# Patient Record
Sex: Female | Born: 1945 | Race: White | Hispanic: No | Marital: Married | Smoking: Current some day smoker
Health system: Southern US, Community
[De-identification: ages and names within clinical notes are randomized; demographics above are authoritative.]

## PROBLEM LIST (undated history)

## (undated) DIAGNOSIS — I10 Essential (primary) hypertension: Secondary | ICD-10-CM

## (undated) DIAGNOSIS — C50919 Malignant neoplasm of unspecified site of unspecified female breast: Secondary | ICD-10-CM

## (undated) DIAGNOSIS — J9819 Other pulmonary collapse: Secondary | ICD-10-CM

## (undated) DIAGNOSIS — D759 Disease of blood and blood-forming organs, unspecified: Secondary | ICD-10-CM

## (undated) DIAGNOSIS — J449 Chronic obstructive pulmonary disease, unspecified: Secondary | ICD-10-CM

## (undated) DIAGNOSIS — IMO0002 Reserved for concepts with insufficient information to code with codable children: Secondary | ICD-10-CM

## (undated) DIAGNOSIS — R51 Headache: Secondary | ICD-10-CM

## (undated) DIAGNOSIS — T8859XA Other complications of anesthesia, initial encounter: Secondary | ICD-10-CM

## (undated) DIAGNOSIS — E785 Hyperlipidemia, unspecified: Secondary | ICD-10-CM

## (undated) DIAGNOSIS — Z8614 Personal history of Methicillin resistant Staphylococcus aureus infection: Secondary | ICD-10-CM

## (undated) DIAGNOSIS — Z9221 Personal history of antineoplastic chemotherapy: Secondary | ICD-10-CM

## (undated) DIAGNOSIS — C229 Malignant neoplasm of liver, not specified as primary or secondary: Secondary | ICD-10-CM

## (undated) DIAGNOSIS — T4145XA Adverse effect of unspecified anesthetic, initial encounter: Secondary | ICD-10-CM

## (undated) DIAGNOSIS — K219 Gastro-esophageal reflux disease without esophagitis: Secondary | ICD-10-CM

## (undated) DIAGNOSIS — M1990 Unspecified osteoarthritis, unspecified site: Secondary | ICD-10-CM

## (undated) DIAGNOSIS — R519 Headache, unspecified: Secondary | ICD-10-CM

## (undated) DIAGNOSIS — Z923 Personal history of irradiation: Secondary | ICD-10-CM

## (undated) DIAGNOSIS — C3490 Malignant neoplasm of unspecified part of unspecified bronchus or lung: Secondary | ICD-10-CM

## (undated) HISTORY — PX: SHOULDER SURGERY: SHX246

## (undated) HISTORY — PX: NASAL SINUS SURGERY: SHX719

## (undated) HISTORY — DX: Personal history of antineoplastic chemotherapy: Z92.21

## (undated) HISTORY — DX: Unspecified osteoarthritis, unspecified site: M19.90

## (undated) HISTORY — PX: ABDOMINAL HYSTERECTOMY: SHX81

## (undated) HISTORY — DX: Personal history of irradiation: Z92.3

## (undated) HISTORY — PX: OTHER SURGICAL HISTORY: SHX169

## (undated) HISTORY — PX: JOINT REPLACEMENT: SHX530

## (undated) HISTORY — DX: Hyperlipidemia, unspecified: E78.5

## (undated) HISTORY — PX: TOTAL HIP ARTHROPLASTY: SHX124

## (undated) HISTORY — DX: Essential (primary) hypertension: I10

## (undated) HISTORY — DX: Reserved for concepts with insufficient information to code with codable children: IMO0002

## (undated) HISTORY — DX: Malignant neoplasm of unspecified site of unspecified female breast: C50.919

## (undated) HISTORY — PX: SQUAMOUS CELL CARCINOMA EXCISION: SHX2433

---

## 2000-03-12 HISTORY — PX: BREAST BIOPSY: SHX20

## 2002-08-24 ENCOUNTER — Other Ambulatory Visit: Admission: RE | Admit: 2002-08-24 | Discharge: 2002-08-24 | Payer: Self-pay | Admitting: Family Medicine

## 2005-02-06 ENCOUNTER — Ambulatory Visit: Payer: Self-pay | Admitting: Family Medicine

## 2005-02-22 ENCOUNTER — Ambulatory Visit: Payer: Self-pay | Admitting: Family Medicine

## 2005-03-08 ENCOUNTER — Ambulatory Visit: Payer: Self-pay | Admitting: Family Medicine

## 2005-03-12 HISTORY — PX: BREAST EXCISIONAL BIOPSY: SUR124

## 2005-05-17 ENCOUNTER — Ambulatory Visit: Payer: Self-pay | Admitting: Family Medicine

## 2005-05-18 ENCOUNTER — Ambulatory Visit: Payer: Self-pay | Admitting: Family Medicine

## 2005-05-25 ENCOUNTER — Ambulatory Visit: Payer: Self-pay | Admitting: Family Medicine

## 2005-06-01 ENCOUNTER — Ambulatory Visit: Payer: Self-pay | Admitting: General Surgery

## 2005-06-04 ENCOUNTER — Ambulatory Visit: Payer: Self-pay | Admitting: Family Medicine

## 2005-06-12 ENCOUNTER — Other Ambulatory Visit: Payer: Self-pay

## 2005-06-20 ENCOUNTER — Ambulatory Visit: Payer: Self-pay | Admitting: General Surgery

## 2005-07-05 ENCOUNTER — Ambulatory Visit: Payer: Self-pay | Admitting: Internal Medicine

## 2005-07-10 ENCOUNTER — Ambulatory Visit: Payer: Self-pay | Admitting: Internal Medicine

## 2005-07-20 ENCOUNTER — Ambulatory Visit: Payer: Self-pay | Admitting: General Surgery

## 2005-08-10 ENCOUNTER — Ambulatory Visit: Payer: Self-pay | Admitting: Internal Medicine

## 2005-09-09 ENCOUNTER — Ambulatory Visit: Payer: Self-pay | Admitting: Internal Medicine

## 2005-10-10 ENCOUNTER — Ambulatory Visit: Payer: Self-pay | Admitting: Internal Medicine

## 2005-11-10 ENCOUNTER — Ambulatory Visit: Payer: Self-pay | Admitting: Internal Medicine

## 2005-12-10 ENCOUNTER — Ambulatory Visit: Payer: Self-pay | Admitting: Internal Medicine

## 2006-01-02 ENCOUNTER — Ambulatory Visit: Payer: Self-pay | Admitting: Family Medicine

## 2006-01-08 ENCOUNTER — Other Ambulatory Visit: Admission: RE | Admit: 2006-01-08 | Discharge: 2006-01-08 | Payer: Self-pay | Admitting: Family Medicine

## 2006-01-08 ENCOUNTER — Encounter: Payer: Self-pay | Admitting: Family Medicine

## 2006-01-08 ENCOUNTER — Ambulatory Visit: Payer: Self-pay | Admitting: Family Medicine

## 2006-01-08 LAB — CONVERTED CEMR LAB: Pap Smear: NORMAL

## 2006-01-10 ENCOUNTER — Ambulatory Visit: Payer: Self-pay | Admitting: Internal Medicine

## 2006-02-04 ENCOUNTER — Ambulatory Visit: Payer: Self-pay | Admitting: Family Medicine

## 2006-02-09 ENCOUNTER — Ambulatory Visit: Payer: Self-pay | Admitting: Internal Medicine

## 2006-03-11 ENCOUNTER — Ambulatory Visit: Payer: Self-pay | Admitting: General Surgery

## 2006-03-12 ENCOUNTER — Ambulatory Visit: Payer: Self-pay | Admitting: Internal Medicine

## 2006-03-19 ENCOUNTER — Ambulatory Visit: Payer: Self-pay | Admitting: Family Medicine

## 2006-03-20 ENCOUNTER — Ambulatory Visit: Payer: Self-pay | Admitting: Family Medicine

## 2006-04-08 ENCOUNTER — Ambulatory Visit: Payer: Self-pay | Admitting: Family Medicine

## 2006-04-08 LAB — CONVERTED CEMR LAB
AST: 17 units/L (ref 0–37)
Cholesterol: 157 mg/dL (ref 0–200)
Total CHOL/HDL Ratio: 2.8

## 2006-04-10 ENCOUNTER — Ambulatory Visit: Payer: Self-pay | Admitting: Family Medicine

## 2006-04-19 ENCOUNTER — Ambulatory Visit: Payer: Self-pay | Admitting: Internal Medicine

## 2006-05-11 ENCOUNTER — Ambulatory Visit: Payer: Self-pay | Admitting: Internal Medicine

## 2006-06-11 ENCOUNTER — Ambulatory Visit: Payer: Self-pay | Admitting: Internal Medicine

## 2006-06-20 ENCOUNTER — Ambulatory Visit: Payer: Self-pay | Admitting: Family Medicine

## 2006-07-11 ENCOUNTER — Ambulatory Visit: Payer: Self-pay | Admitting: Radiation Oncology

## 2006-07-11 ENCOUNTER — Ambulatory Visit: Payer: Self-pay | Admitting: Internal Medicine

## 2006-08-11 ENCOUNTER — Ambulatory Visit: Payer: Self-pay | Admitting: Radiation Oncology

## 2006-08-11 ENCOUNTER — Ambulatory Visit: Payer: Self-pay | Admitting: Internal Medicine

## 2006-08-30 ENCOUNTER — Ambulatory Visit: Payer: Self-pay | Admitting: General Surgery

## 2006-09-03 ENCOUNTER — Telehealth (INDEPENDENT_AMBULATORY_CARE_PROVIDER_SITE_OTHER): Payer: Self-pay | Admitting: *Deleted

## 2006-09-06 ENCOUNTER — Ambulatory Visit: Payer: Self-pay | Admitting: General Surgery

## 2006-09-10 ENCOUNTER — Ambulatory Visit: Payer: Self-pay | Admitting: Radiation Oncology

## 2006-09-10 ENCOUNTER — Ambulatory Visit: Payer: Self-pay | Admitting: Internal Medicine

## 2006-10-11 ENCOUNTER — Ambulatory Visit: Payer: Self-pay | Admitting: Internal Medicine

## 2006-11-11 ENCOUNTER — Ambulatory Visit: Payer: Self-pay | Admitting: Internal Medicine

## 2006-12-11 ENCOUNTER — Ambulatory Visit: Payer: Self-pay | Admitting: Internal Medicine

## 2006-12-17 ENCOUNTER — Encounter: Payer: Self-pay | Admitting: Family Medicine

## 2006-12-23 ENCOUNTER — Encounter: Payer: Self-pay | Admitting: Family Medicine

## 2006-12-23 DIAGNOSIS — I10 Essential (primary) hypertension: Secondary | ICD-10-CM

## 2006-12-23 DIAGNOSIS — K219 Gastro-esophageal reflux disease without esophagitis: Secondary | ICD-10-CM | POA: Insufficient documentation

## 2007-01-06 ENCOUNTER — Encounter: Payer: Self-pay | Admitting: Family Medicine

## 2007-01-11 ENCOUNTER — Ambulatory Visit: Payer: Self-pay | Admitting: Internal Medicine

## 2007-01-13 ENCOUNTER — Ambulatory Visit: Payer: Self-pay | Admitting: Family Medicine

## 2007-01-13 LAB — CONVERTED CEMR LAB
AST: 21 units/L (ref 0–37)
BUN: 5 mg/dL — ABNORMAL LOW (ref 6–23)
Calcium: 9.1 mg/dL (ref 8.4–10.5)
Eosinophils Absolute: 0.1 10*3/uL (ref 0.0–0.6)
GFR calc Af Amer: 110 mL/min
GFR calc non Af Amer: 91 mL/min
HDL: 62.2 mg/dL (ref >39.0)
Lymphocytes Relative: 28.2 % (ref 12.0–46.0)
MCV: 94.6 fL (ref 78.0–100.0)
Monocytes Relative: 7.8 % (ref 3.0–11.0)
Neutro Abs: 2.7 10*3/uL (ref 1.4–7.7)
Platelets: 240 10*3/uL (ref 150–400)
TSH: 0.55 microintl units/mL (ref 0.35–5.50)
Triglycerides: 104 mg/dL (ref 0–149)
VLDL: 21 mg/dL (ref 0–40)

## 2007-01-14 ENCOUNTER — Encounter: Payer: Self-pay | Admitting: Family Medicine

## 2007-01-14 ENCOUNTER — Ambulatory Visit: Payer: Self-pay | Admitting: Family Medicine

## 2007-01-14 ENCOUNTER — Other Ambulatory Visit: Admission: RE | Admit: 2007-01-14 | Discharge: 2007-01-14 | Payer: Self-pay | Admitting: Family Medicine

## 2007-01-20 ENCOUNTER — Encounter (INDEPENDENT_AMBULATORY_CARE_PROVIDER_SITE_OTHER): Payer: Self-pay | Admitting: *Deleted

## 2007-01-28 ENCOUNTER — Encounter (INDEPENDENT_AMBULATORY_CARE_PROVIDER_SITE_OTHER): Payer: Self-pay | Admitting: *Deleted

## 2007-01-28 ENCOUNTER — Ambulatory Visit: Payer: Self-pay | Admitting: Family Medicine

## 2007-02-10 ENCOUNTER — Ambulatory Visit: Payer: Self-pay | Admitting: Internal Medicine

## 2007-02-14 ENCOUNTER — Ambulatory Visit: Payer: Self-pay | Admitting: General Surgery

## 2007-03-04 ENCOUNTER — Encounter: Payer: Self-pay | Admitting: Family Medicine

## 2007-04-13 ENCOUNTER — Ambulatory Visit: Payer: Self-pay | Admitting: Internal Medicine

## 2007-04-16 ENCOUNTER — Ambulatory Visit: Payer: Self-pay | Admitting: Internal Medicine

## 2007-04-23 ENCOUNTER — Ambulatory Visit: Payer: Self-pay | Admitting: Family Medicine

## 2007-05-11 ENCOUNTER — Ambulatory Visit: Payer: Self-pay | Admitting: Internal Medicine

## 2007-06-10 ENCOUNTER — Ambulatory Visit: Payer: Self-pay | Admitting: Family Medicine

## 2007-06-14 ENCOUNTER — Ambulatory Visit: Payer: Self-pay | Admitting: Internal Medicine

## 2007-06-26 ENCOUNTER — Telehealth: Payer: Self-pay | Admitting: Family Medicine

## 2007-08-11 ENCOUNTER — Ambulatory Visit: Payer: Self-pay | Admitting: Internal Medicine

## 2007-08-15 ENCOUNTER — Ambulatory Visit: Payer: Self-pay | Admitting: Family Medicine

## 2007-08-18 ENCOUNTER — Ambulatory Visit: Payer: Self-pay | Admitting: General Surgery

## 2007-09-02 ENCOUNTER — Encounter: Payer: Self-pay | Admitting: Family Medicine

## 2007-10-11 ENCOUNTER — Ambulatory Visit: Payer: Self-pay | Admitting: Internal Medicine

## 2007-10-15 ENCOUNTER — Ambulatory Visit: Payer: Self-pay | Admitting: Internal Medicine

## 2007-10-23 ENCOUNTER — Telehealth: Payer: Self-pay | Admitting: Family Medicine

## 2007-10-23 ENCOUNTER — Encounter: Payer: Self-pay | Admitting: Family Medicine

## 2007-11-04 ENCOUNTER — Ambulatory Visit: Payer: Self-pay | Admitting: Family Medicine

## 2007-11-04 ENCOUNTER — Encounter: Payer: Self-pay | Admitting: Family Medicine

## 2007-11-11 ENCOUNTER — Ambulatory Visit: Payer: Self-pay | Admitting: Internal Medicine

## 2007-11-13 ENCOUNTER — Ambulatory Visit: Payer: Self-pay | Admitting: Family Medicine

## 2007-11-14 ENCOUNTER — Encounter: Payer: Self-pay | Admitting: Family Medicine

## 2007-12-10 ENCOUNTER — Ambulatory Visit: Payer: Self-pay | Admitting: Family Medicine

## 2007-12-10 DIAGNOSIS — F17200 Nicotine dependence, unspecified, uncomplicated: Secondary | ICD-10-CM

## 2008-01-07 ENCOUNTER — Ambulatory Visit: Payer: Self-pay | Admitting: Internal Medicine

## 2008-01-11 ENCOUNTER — Ambulatory Visit: Payer: Self-pay | Admitting: Internal Medicine

## 2008-01-14 ENCOUNTER — Ambulatory Visit: Payer: Self-pay | Admitting: Radiation Oncology

## 2008-01-16 ENCOUNTER — Ambulatory Visit: Payer: Self-pay | Admitting: General Surgery

## 2008-01-19 ENCOUNTER — Ambulatory Visit: Payer: Self-pay | Admitting: Family Medicine

## 2008-01-20 LAB — CONVERTED CEMR LAB
ALT: 27 units/L (ref 0–35)
AST: 23 units/L (ref 0–37)
Albumin: 3.8 g/dL (ref 3.5–5.2)
Alkaline Phosphatase: 66 units/L (ref 39–117)
BUN: 13 mg/dL (ref 6–23)
CO2: 29 meq/L (ref 19–32)
Eosinophils Relative: 3.8 % (ref 0.0–5.0)
GFR calc Af Amer: 94 mL/min
Glucose, Bld: 97 mg/dL (ref 70–99)
HCT: 37.5 % (ref 36.0–46.0)
HDL: 50.3 mg/dL (ref >39.0)
Hemoglobin: 13.2 g/dL (ref 12.0–15.0)
Lymphocytes Relative: 23.1 % (ref 12.0–46.0)
Monocytes Absolute: 0.3 10*3/uL (ref 0.1–1.0)
Monocytes Relative: 8.4 % (ref 3.0–12.0)
Platelets: 225 10*3/uL (ref 150–400)
Potassium: 5 meq/L (ref 3.5–5.1)
Total CHOL/HDL Ratio: 3
Total Protein: 6.3 g/dL (ref 6.0–8.3)
WBC: 4 10*3/uL — ABNORMAL LOW (ref 4.5–10.5)

## 2008-01-22 ENCOUNTER — Ambulatory Visit: Payer: Self-pay | Admitting: Family Medicine

## 2008-01-29 ENCOUNTER — Encounter: Payer: Self-pay | Admitting: Family Medicine

## 2008-02-02 ENCOUNTER — Ambulatory Visit: Payer: Self-pay | Admitting: Family Medicine

## 2008-02-10 ENCOUNTER — Ambulatory Visit: Payer: Self-pay | Admitting: Radiation Oncology

## 2008-02-13 ENCOUNTER — Ambulatory Visit: Payer: Self-pay | Admitting: General Surgery

## 2008-02-13 ENCOUNTER — Ambulatory Visit: Payer: Self-pay | Admitting: Family Medicine

## 2008-02-13 LAB — CONVERTED CEMR LAB: OCCULT 1: NEGATIVE

## 2008-02-16 ENCOUNTER — Encounter (INDEPENDENT_AMBULATORY_CARE_PROVIDER_SITE_OTHER): Payer: Self-pay | Admitting: *Deleted

## 2008-05-04 ENCOUNTER — Ambulatory Visit: Payer: Self-pay | Admitting: Family Medicine

## 2008-05-10 ENCOUNTER — Ambulatory Visit: Payer: Self-pay | Admitting: Otolaryngology

## 2008-05-20 ENCOUNTER — Ambulatory Visit: Payer: Self-pay | Admitting: Otolaryngology

## 2008-06-10 ENCOUNTER — Ambulatory Visit: Payer: Self-pay | Admitting: Internal Medicine

## 2008-06-16 ENCOUNTER — Ambulatory Visit: Payer: Self-pay | Admitting: Internal Medicine

## 2008-07-10 ENCOUNTER — Ambulatory Visit: Payer: Self-pay | Admitting: Internal Medicine

## 2008-08-30 ENCOUNTER — Ambulatory Visit: Payer: Self-pay | Admitting: General Surgery

## 2008-09-09 ENCOUNTER — Encounter: Payer: Self-pay | Admitting: Family Medicine

## 2008-12-08 ENCOUNTER — Ambulatory Visit: Payer: Self-pay | Admitting: Internal Medicine

## 2008-12-10 ENCOUNTER — Ambulatory Visit: Payer: Self-pay | Admitting: Internal Medicine

## 2009-01-10 ENCOUNTER — Ambulatory Visit: Payer: Self-pay | Admitting: Radiation Oncology

## 2009-01-13 ENCOUNTER — Ambulatory Visit: Payer: Self-pay | Admitting: Radiation Oncology

## 2009-01-20 ENCOUNTER — Ambulatory Visit: Payer: Self-pay | Admitting: Family Medicine

## 2009-01-20 LAB — CONVERTED CEMR LAB
BUN: 7 mg/dL (ref 6–23)
Basophils Relative: 1.3 % (ref 0.0–3.0)
Bilirubin, Direct: 0 mg/dL (ref 0.0–0.3)
CO2: 28 meq/L (ref 19–32)
Chloride: 103 meq/L (ref 96–112)
Eosinophils Relative: 2.3 % (ref 0.0–5.0)
Glucose, Bld: 99 mg/dL (ref 70–99)
HCT: 40 % (ref 36.0–46.0)
HDL: 59.2 mg/dL (ref >39.00)
LDL Cholesterol: 89 mg/dL (ref 0–99)
Lymphs Abs: 1.4 10*3/uL (ref 0.7–4.0)
MCV: 94.1 fL (ref 78.0–100.0)
Monocytes Absolute: 0.4 10*3/uL (ref 0.1–1.0)
Monocytes Relative: 8.5 % (ref 3.0–12.0)
Neutrophils Relative %: 55 % (ref 43.0–77.0)
Potassium: 4.4 meq/L (ref 3.5–5.1)
RBC: 4.25 M/uL (ref 3.87–5.11)
Sodium: 139 meq/L (ref 135–145)
TSH: 0.59 microintl units/mL (ref 0.35–5.50)
Total Bilirubin: 0.9 mg/dL (ref 0.3–1.2)
Total CHOL/HDL Ratio: 3
VLDL: 17.4 mg/dL (ref 0.0–40.0)
WBC: 4.4 10*3/uL — ABNORMAL LOW (ref 4.5–10.5)

## 2009-01-23 LAB — CONVERTED CEMR LAB: Vit D, 25-Hydroxy: 28 ng/mL — ABNORMAL LOW (ref 30–89)

## 2009-01-25 ENCOUNTER — Other Ambulatory Visit: Admission: RE | Admit: 2009-01-25 | Discharge: 2009-01-25 | Payer: Self-pay | Admitting: Family Medicine

## 2009-01-25 ENCOUNTER — Encounter: Payer: Self-pay | Admitting: Family Medicine

## 2009-01-25 ENCOUNTER — Ambulatory Visit: Payer: Self-pay | Admitting: Family Medicine

## 2009-01-25 LAB — CONVERTED CEMR LAB: Pap Smear: NORMAL

## 2009-01-27 ENCOUNTER — Encounter (INDEPENDENT_AMBULATORY_CARE_PROVIDER_SITE_OTHER): Payer: Self-pay | Admitting: *Deleted

## 2009-02-09 ENCOUNTER — Ambulatory Visit: Payer: Self-pay | Admitting: Radiation Oncology

## 2009-02-11 ENCOUNTER — Ambulatory Visit: Payer: Self-pay | Admitting: Family Medicine

## 2009-02-11 LAB — CONVERTED CEMR LAB
OCCULT 1: NEGATIVE
OCCULT 3: NEGATIVE

## 2009-02-14 ENCOUNTER — Encounter (INDEPENDENT_AMBULATORY_CARE_PROVIDER_SITE_OTHER): Payer: Self-pay | Admitting: *Deleted

## 2009-02-16 ENCOUNTER — Ambulatory Visit: Payer: Self-pay | Admitting: Internal Medicine

## 2009-03-12 ENCOUNTER — Ambulatory Visit: Payer: Self-pay | Admitting: Radiation Oncology

## 2009-03-12 ENCOUNTER — Ambulatory Visit: Payer: Self-pay | Admitting: Internal Medicine

## 2009-03-12 DIAGNOSIS — Z8614 Personal history of Methicillin resistant Staphylococcus aureus infection: Secondary | ICD-10-CM

## 2009-03-12 HISTORY — DX: Personal history of Methicillin resistant Staphylococcus aureus infection: Z86.14

## 2009-06-10 ENCOUNTER — Ambulatory Visit: Payer: Self-pay | Admitting: Internal Medicine

## 2009-06-22 ENCOUNTER — Ambulatory Visit: Payer: Self-pay | Admitting: Internal Medicine

## 2009-07-10 ENCOUNTER — Ambulatory Visit: Payer: Self-pay | Admitting: Internal Medicine

## 2009-09-22 ENCOUNTER — Ambulatory Visit: Payer: Self-pay | Admitting: General Surgery

## 2009-10-12 ENCOUNTER — Ambulatory Visit: Payer: Self-pay

## 2009-10-17 ENCOUNTER — Encounter: Payer: Self-pay | Admitting: Family Medicine

## 2009-10-18 ENCOUNTER — Encounter (INDEPENDENT_AMBULATORY_CARE_PROVIDER_SITE_OTHER): Payer: Self-pay | Admitting: *Deleted

## 2010-01-17 ENCOUNTER — Ambulatory Visit: Payer: Self-pay | Admitting: Internal Medicine

## 2010-01-18 LAB — CANCER ANTIGEN 27.29: CA 27.29: 23.2 U/mL (ref 0.0–38.6)

## 2010-02-09 ENCOUNTER — Ambulatory Visit: Payer: Self-pay | Admitting: Internal Medicine

## 2010-04-11 NOTE — Letter (Signed)
Summary: Shelly Arias letter  [place] [facility]  [address] [place], [place] [address]   Phone: [phone]  Fax: [phone]       10/18/2009 MRN: [id number]  [facility] [address] [place], [place]  [address]  Dear Ms. [facility] [place], and [place] announce the retirement of Arta Silence, M.D., from full-time practice at the [facility] office effective September 08, 2009 and his plans of returning part-time.  It is important to Dr. Hetty Ely and to our practice that you understand that [facility] has seven physicians in our office for your health care needs.  We will continue to offer the same exceptional care that you have today.    Dr. Hetty Ely has spoken to many of you about his plans for retirement and returning part-time in the fall.   We will continue to work with you through the transition to schedule appointments for you in the office and meet the high standards that [facility] is committed to.   Again, it is with great pleasure that we share the news that Dr. Hetty Ely will return to [facility] in October of 2011 with a reduced schedule.    If you have any questions, or would like to request an appointment with one of our physicians, please call us at [phone] and press the option for Scheduling an appointment.  We take pleasure in providing you with excellent patient care and look forward to seeing you at your next office visit.  Our [facility] Physicians are:  Tillman Abide, M.D. Laurita Quint, M.D. Roxy Manns, M.D. Kerby Nora, M.D. Hannah Beat, M.D. Ruthe Mannan, M.D. We proudly welcomed Raechel Ache, M.D. and Eustaquio Boyden, M.D. to the practice in July/August 2011.  Sincerely,  [facility]

## 2010-04-11 NOTE — Letter (Signed)
Summary: [facility]  [facility]   Imported By: Maryln Gottron 10/25/2009 11:25:53  _____________________________________________________________________  External Attachment:    Type:   Image     Comment:   External Document  Appended Document: [facility]    Clinical Lists Changes  Observations: Added new observation of PAST MED HX: R Breast cancer, hx of, per Dr. Lemar Livings.  s/p chemo/rady tx GERD Hyperlipidemia Hypertension Osteopenia  (10/26/2009 13:15)       Past History:  Past Medical History: R Breast cancer, hx of, per Dr. Lemar Livings.  s/p chemo/rady tx GERD Hyperlipidemia Hypertension Osteopenia

## 2010-06-19 ENCOUNTER — Other Ambulatory Visit: Payer: Self-pay | Admitting: Family Medicine

## 2010-06-28 ENCOUNTER — Ambulatory Visit: Payer: Self-pay | Admitting: Internal Medicine

## 2010-06-29 LAB — CANCER ANTIGEN 27.29: CA 27.29: 26.1 U/mL (ref 0.0–38.6)

## 2010-07-11 ENCOUNTER — Ambulatory Visit: Payer: Self-pay | Admitting: Internal Medicine

## 2010-07-31 ENCOUNTER — Other Ambulatory Visit: Payer: Self-pay | Admitting: *Deleted

## 2010-07-31 MED ORDER — PRAVASTATIN SODIUM 40 MG PO TABS: ORAL_TABLET | ORAL | Status: DC

## 2010-09-25 ENCOUNTER — Ambulatory Visit: Payer: Self-pay | Admitting: Internal Medicine

## 2010-10-24 ENCOUNTER — Ambulatory Visit: Payer: Self-pay | Admitting: General Surgery

## 2010-11-02 ENCOUNTER — Other Ambulatory Visit (HOSPITAL_COMMUNITY)
Admission: RE | Admit: 2010-11-02 | Discharge: 2010-11-02 | Disposition: A | Discharge status: Home / Self Care | Payer: No Typology Code available for payment source | Source: Ambulatory Visit | Attending: Internal Medicine | Admitting: Internal Medicine

## 2010-11-02 ENCOUNTER — Encounter: Payer: Self-pay | Admitting: Internal Medicine

## 2010-11-02 ENCOUNTER — Ambulatory Visit (INDEPENDENT_AMBULATORY_CARE_PROVIDER_SITE_OTHER): Payer: No Typology Code available for payment source | Admitting: Internal Medicine

## 2010-11-02 VITALS — BP 136/85 | HR 85 | Temp 98.2°F | Resp 16 | Ht 61.0 in | Wt 167.0 lb

## 2010-11-02 DIAGNOSIS — F17200 Nicotine dependence, unspecified, uncomplicated: Secondary | ICD-10-CM

## 2010-11-02 DIAGNOSIS — Z01419 Encounter for gynecological examination (general) (routine) without abnormal findings: Secondary | ICD-10-CM | POA: Insufficient documentation

## 2010-11-02 DIAGNOSIS — E785 Hyperlipidemia, unspecified: Secondary | ICD-10-CM | POA: Insufficient documentation

## 2010-11-02 DIAGNOSIS — Z0000 Encounter for general adult medical examination without abnormal findings: Secondary | ICD-10-CM

## 2010-11-02 MED ORDER — PRAVASTATIN SODIUM 40 MG PO TABS: 40.0000 mg | ORAL_TABLET | Freq: Every day | ORAL | Status: DC

## 2010-11-02 NOTE — Progress Notes (Signed)
Subjective:    Patient ID: Dawayne Cirri, female    DOB: 09-Feb-1946, 65 y.o.   MRN: [id number]  HPI  Ms. Starlin is a 65 year old female who presents for her general physical exam. She denies any complaints today. She reports she has been feeling well. She has been active and has spent the summer spending time with her grandchildren. She reports a normal appetite. She continues to smoke. She has cut down on the number of cigarettes she smokes, however she continues to smoke one to 5 cigarettes per day.  Outpatient Encounter Prescriptions as of 11/02/2010  Medication Sig Dispense Refill  . metoprolol succinate (TOPROL-XL) 25 MG 24 hr tablet Take 25 mg by mouth daily.        . zoledronic acid (RECLAST) 5 MG/100ML SOLN Inject 5 mg into the vein once. Yearly for osteoporosis       . pravastatin (PRAVACHOL) 40 MG tablet Take 1 tablet (40 mg total) by mouth daily.  90 tablet  3     Review of Systems  Constitutional: Negative for fever, chills, diaphoresis, activity change, appetite change, fatigue and unexpected weight change.  HENT: Negative for hearing loss, ear pain, nosebleeds, congestion, sore throat, facial swelling, rhinorrhea, sneezing, drooling, mouth sores, trouble swallowing, neck pain, neck stiffness, dental problem, voice change, postnasal drip, sinus pressure, tinnitus and ear discharge.   Eyes: Negative for photophobia, pain, discharge, redness, itching and visual disturbance.  Respiratory: Negative for apnea, cough, choking, chest tightness, shortness of breath, wheezing and stridor.   Cardiovascular: Negative for chest pain, palpitations and leg swelling.  Gastrointestinal: Negative for nausea, vomiting, abdominal pain, diarrhea, constipation, blood in stool, abdominal distention, anal bleeding and rectal pain.  Genitourinary: Negative for dysuria, urgency, frequency, hematuria, flank pain, decreased urine volume, vaginal bleeding, vaginal discharge, enuresis, difficulty urinating,  vaginal pain, menstrual problem, pelvic pain and dyspareunia.  Musculoskeletal: Negative for myalgias, back pain, joint swelling, arthralgias and gait problem.  Skin: Negative for color change, rash and wound.  Neurological: Negative for dizziness, tremors, seizures, syncope, facial asymmetry, speech difficulty, weakness, light-headedness, numbness and headaches.  Hematological: Negative for adenopathy. Does not bruise/bleed easily.  Psychiatric/Behavioral: Negative for suicidal ideas, hallucinations, sleep disturbance, dysphoric mood and decreased concentration. The patient is not nervous/anxious.    BP 136/85  Pulse 85  Temp(Src) 98.2 F (36.8 C) (Oral)  Resp 16  Ht 5\' 1"  (1.549 m)  Wt 167 lb (75.751 kg)  BMI 31.55 kg/m2  SpO2 98%     Objective:   Physical Exam  Constitutional: She is oriented to person, place, and time. Vital signs are normal. She appears well-developed and well-nourished. No distress.  HENT:  Head: Normocephalic and atraumatic.  Right Ear: External ear and ear canal normal. No decreased hearing is noted.  Left Ear: External ear and ear canal normal. No decreased hearing is noted.  Nose: Nose normal.  Mouth/Throat: Oropharynx is clear and moist. No oropharyngeal exudate.  Eyes: Conjunctivae, EOM and lids are normal. Pupils are equal, round, and reactive to light. Right eye exhibits no discharge. Left eye exhibits no discharge. No scleral icterus.  Neck: Normal range of motion. Neck supple. Carotid bruit is not present. No tracheal deviation, no edema and no erythema present. No mass and no thyromegaly present.  Cardiovascular: Normal rate, regular rhythm, normal heart sounds and intact distal pulses.  Exam reveals no gallop and no friction rub.   No murmur heard. Pulmonary/Chest: Effort normal and breath sounds normal. No respiratory distress. She has no wheezes.  She has no rales. She exhibits no tenderness.  Abdominal: Soft. Normal appearance and bowel sounds are  normal. There is no hepatosplenomegaly. There is no tenderness.  Genitourinary: Vagina normal. No breast swelling, tenderness, discharge or bleeding. Pelvic exam was performed with patient prone. There is no rash, tenderness, lesion or injury on the right labia. There is no rash, tenderness, lesion or injury on the left labia.       Vaginal walls normal except for mild atrophy  Musculoskeletal: Normal range of motion. She exhibits no edema and no tenderness.  Lymphadenopathy:    She has no cervical adenopathy.    She has no axillary adenopathy.  Neurological: She is alert and oriented to person, place, and time. She displays no tremor. No cranial nerve deficit or sensory deficit. She exhibits normal muscle tone. Coordination and gait normal.  Skin: Skin is warm and dry. No rash noted. She is not diaphoretic. No cyanosis or erythema. No pallor. Nails show no clubbing.  Psychiatric: She has a normal mood and affect. Her speech is normal and behavior is normal. Judgment and thought content normal. Cognition and memory are normal.          Assessment & Plan:  1. General Physical Exam - Ms.  Bowring presents for her general physical exam today. She has been doing very well and has no complaints today. Exam is normal except for mild atrophic vaginitis. She has been on statin therapy for hyperlipidemia and we will recheck lipids and liver function tests with labs. She is up-to-date on her mammogram. We will request records from Dr. Silverio Lay office. We will also request records on her recent DEXA scan.  We will call her or send letter with results of labs.  Strongly encouraged smoking cessation today and gave handout on smoking cessation. She understands risks of smoking especially in light of her strong family history of heart disease and personal history of breast cancer. She will follow up in 6 months.

## 2010-11-02 NOTE — Patient Instructions (Signed)
Smoking Cessation This document explains the best ways for you to quit smoking and new treatments to help. It lists new medicines that can double or triple your chances of quitting and quitting for good. It also considers ways to avoid relapses and concerns you may have about quitting, including weight gain. NICOTINE: A POWERFUL ADDICTION If you have tried to quit smoking, you know how hard it can be. It is hard because nicotine is a very addictive drug. For some people, it can be as addictive as heroin or cocaine. Usually, people make 2 or 3 tries, or more, before finally being able to quit. Each time you try to quit, you can learn about what helps and what hurts. Quitting takes hard work and a lot of effort, but you can quit smoking. QUITTING SMOKING IS ONE OF THE MOST IMPORTANT THINGS YOU WILL EVER DO:  You will live longer, feel better, and live better.   The impact on your body of quitting smoking is felt almost immediately:   Within 20 minutes, blood pressure decreases. Pulse returns to its normal level.   After 8 hours, carbon monoxide levels in the blood return to normal. Oxygen level increases.   After 24 hours, chance of heart attack starts to decrease. Breath, hair, and body stop smelling like smoke.   After 48 hours, damaged nerve endings begin to recover. Sense of taste and smell improve.   After 72 hours, the body is virtually free of nicotine. Bronchial tubes relax and breathing becomes easier.   After 2 to 12 weeks, lungs can hold more air. Exercise becomes easier and circulation improves.   Quitting will lower your chance of having a heart attack, stroke, cancer, or lung disease:   After 1 year, the risk of coronary heart disease is cut in half.   After 5 years, the risk of stroke falls to the same as a nonsmoker.   After 10 years, the risk of lung cancer is cut in half and the risk of other cancers decreases significantly.   After 15 years, the risk of coronary heart  disease drops, usually to the level of a nonsmoker.   If you are pregnant, quitting smoking will improve your chances of having a healthy baby.   The people you live with, especially your children, will be healthier.   You will have extra money to spend on things other than cigarettes.  FIVE KEYS TO QUITTING Studies have shown that these 5 steps will help you quit smoking and quit for good. You have the best chances of quitting if you use them together: 1. Get ready.  2. Get support and encouragement.  3. Learn new skills and behaviors.  4. Get medicine to reduce your nicotine addiction and use it correctly.  5. Be prepared for relapse or difficult situations. Be determined to continue trying to quit, even if you do not succeed at first.  1. GET READY  Set a quit date.   Change your environment.   Get rid of ALL cigarettes, ashtrays, matches, and lighters in your home, car, and place of work.   Do not let people smoke in your home.   Review your past attempts to quit. Think about what worked and what did not.   Once you quit, do not smoke. NOT EVEN A PUFF!  2. GET SUPPORT AND ENCOURAGEMENT Studies have shown that you have a better chance of being successful if you have help. You can get support in many ways.  Tell   your family, friends, and coworkers that you are going to quit and need their support. Ask them not to smoke around you.   Talk to your caregivers (doctor, dentist, nurse, pharmacist, psychologist, and/or smoking counselor).   Get individual, group, or telephone counseling and support. The more counseling you have, the better your chances are of quitting. Programs are available at local hospitals and health centers. Call your local health department for information about programs in your area.   Spiritual beliefs and practices may help some smokers quit.   Quit meters are small computer programs online or downloadable that keep track of quit statistics, such as amount  of "quit-time," cigarettes not smoked, and money saved.   Many smokers find one or more of the many self-help books available useful in helping them quit and stay off tobacco.  3. LEARN NEW SKILLS AND BEHAVIORS  Try to distract yourself from urges to smoke. Talk to someone, go for a walk, or occupy your time with a task.   When you first try to quit, change your routine. Take a different route to work. Drink tea instead of coffee. Eat breakfast in a different place.   Do something to reduce your stress. Take a hot bath, exercise, or read a book.   Plan something enjoyable to do every day. Reward yourself for not smoking.   Explore interactive web-based programs that specialize in helping you quit.  4. GET MEDICINE AND USE IT CORRECTLY Medicines can help you stop smoking and decrease the urge to smoke. Combining medicine with the above behavioral methods and support can quadruple your chances of successfully quitting smoking. The U.S. Food and Drug Administration (FDA) has approved 7 medicines to help you quit smoking. These medicines fall into 3 categories.  Nicotine replacement therapy (delivers nicotine to your body without the negative effects and risks of smoking):   Nicotine gum: Available over-the-counter.   Nicotine lozenges: Available over-the-counter.   Nicotine inhaler: Available by prescription.   Nicotine nasal spray: Available by prescription.   Nicotine skin patches (transdermal): Available by prescription and over-the-counter.   Antidepressant medicine (helps people abstain from smoking, but how this works is unknown):   Bupropion sustained-release (SR) tablets: Available by prescription.   Nicotinic receptor partial agonist (simulates the effect of nicotine in your brain):   Varenicline tartrate tablets: Available by prescription.   Ask your caregiver for advice about which medicines to use and how to use them. Carefully read the information on the package.    Everyone who is trying to quit may benefit from using a medicine. If you are pregnant or trying to become pregnant, nursing an infant, you are under age 18, or you smoke fewer than 10 cigarettes per day, talk to your caregiver before taking any nicotine replacement medicines.   You should stop using a nicotine replacement product and call your caregiver if you experience nausea, dizziness, weakness, vomiting, fast or irregular heartbeat, mouth problems with the lozenge or gum, or redness or swelling of the skin around the patch that does not go away.   Do not use any other product containing nicotine while using a nicotine replacement product.   Talk to your caregiver before using these products if you have diabetes, heart disease, asthma, stomach ulcers, you had a recent heart attack, you have high blood pressure that is not controlled with medicine, a history of irregular heartbeat, or you have been prescribed medicine to help you quit smoking.  5. BE PREPARED FOR RELAPSE OR   DIFFICULT SITUATIONS  Most relapses occur within the first 3 months after quitting. Do not be discouraged if you start smoking again. Remember, most people try several times before they finally quit.   You may have symptoms of withdrawal because your body is used to nicotine. You may crave cigarettes, be irritable, feel very hungry, cough often, get headaches, or have difficulty concentrating.   The withdrawal symptoms are only temporary. They are strongest when you first quit, but they will go away within 10 to 14 days.  Here are some difficult situations to watch for:  Alcohol. Avoid drinking alcohol. Drinking lowers your chances of successfully quitting.   Caffeine. Try to reduce the amount of caffeine you consume. It also lowers your chances of successfully quitting.   Other smokers. Being around smoking can make you want to smoke. Avoid smokers.   Weight gain. Many smokers will gain weight when they quit, usually  less than 10 pounds. Eat a healthy diet and stay active. Do not let weight gain distract you from your main goal, quitting smoking. Some medicines that help you quit smoking may also help delay weight gain. You can always lose the weight gained after you quit.   Bad mood or depression. There are a lot of ways to improve your mood other than smoking.  If you are having problems with any of these situations, talk to your caregiver. SPECIAL SITUATIONS OR CONDITIONS Studies suggest that everyone can quit smoking. Your situation or condition can give you a special reason to quit.  Pregnant women/New mothers: By quitting, you protect your baby's health and your own.   Hospitalized patients: By quitting, you reduce health problems and help healing.   Heart attack patients: By quitting, you reduce your risk of a second heart attack.   Lung, head, and neck cancer patients: By quitting, you reduce your chance of a second cancer.   Parents of children and adolescents: By quitting, you protect your children from illnesses caused by secondhand smoke.  QUESTIONS TO THINK ABOUT Think about the following questions before you try to stop smoking. You may want to talk about your answers with your caregiver.  Why do you want to quit?   If you tried to quit in the past, what helped and what did not?   What will be the most difficult situations for you after you quit? How will you plan to handle them?   Who can help you through the tough times? Your family? Friends? Caregiver?   What pleasures do you get from smoking? What ways can you still get pleasure if you quit?  Here are some questions to ask your caregiver:  How can you help me to be successful at quitting?   What medicine do you think would be best for me and how should I take it?   What should I do if I need more help?   What is smoking withdrawal like? How can I get information on withdrawal?  Quitting takes hard work and a lot of effort,  but you can quit smoking. FOR MORE INFORMATION Smokefree.gov (http://www.smokefree.gov) provides free, accurate, evidence-based information and professional assistance to help support the immediate and long-term needs of people trying to quit smoking. Document Released: 02/20/2001 Document Re-Released: 08/16/2009 Exit[employer] Patient Information 2011 [employer]. 

## 2010-11-07 ENCOUNTER — Other Ambulatory Visit: Payer: PRIVATE HEALTH INSURANCE | Admitting: *Deleted

## 2010-11-07 ENCOUNTER — Other Ambulatory Visit: Payer: Self-pay | Admitting: *Deleted

## 2010-11-07 LAB — CBC WITH DIFFERENTIAL/PLATELET
Basophils Absolute: 0 10*3/uL (ref 0.0–0.1)
Basophils Relative: 0.5 % (ref 0.0–3.0)
Hemoglobin: 14.4 g/dL (ref 12.0–15.0)
Lymphocytes Relative: 29.9 % (ref 12.0–46.0)
Monocytes Relative: 10.7 % (ref 3.0–12.0)
Neutro Abs: 2.5 10*3/uL (ref 1.4–7.7)
RBC: 4.65 Mil/uL (ref 3.87–5.11)
RDW: 14.2 % (ref 11.5–14.6)

## 2010-11-07 LAB — COMPREHENSIVE METABOLIC PANEL
ALT: 32 U/L (ref 0–35)
CO2: 25 mEq/L (ref 19–32)
Calcium: 8.9 mg/dL (ref 8.4–10.5)
Chloride: 104 mEq/L (ref 96–112)
Creatinine, Ser: 0.6 mg/dL (ref 0.4–1.2)
GFR: 109.37 mL/min (ref >60.00)
Glucose, Bld: 102 mg/dL — ABNORMAL HIGH (ref 70–99)
Total Bilirubin: 0.9 mg/dL (ref 0.3–1.2)
Total Protein: 7.3 g/dL (ref 6.0–8.3)

## 2010-11-07 LAB — LIPID PANEL
Cholesterol: 201 mg/dL — ABNORMAL HIGH (ref 0–200)
HDL: 58.8 mg/dL (ref >39.00)
VLDL: 19 mg/dL (ref 0.0–40.0)

## 2010-11-07 LAB — LDL CHOLESTEROL, DIRECT: Direct LDL: 133.9 mg/dL

## 2010-11-07 NOTE — Telephone Encounter (Signed)
Opened in error

## 2010-11-09 ENCOUNTER — Other Ambulatory Visit (INDEPENDENT_AMBULATORY_CARE_PROVIDER_SITE_OTHER): Payer: PRIVATE HEALTH INSURANCE | Admitting: *Deleted

## 2010-11-09 DIAGNOSIS — Z0000 Encounter for general adult medical examination without abnormal findings: Secondary | ICD-10-CM

## 2010-11-10 LAB — T4, FREE: Free T4: 1.11 ng/dL (ref 0.60–1.60)

## 2010-11-15 ENCOUNTER — Encounter: Payer: Self-pay | Admitting: Internal Medicine

## 2010-12-12 ENCOUNTER — Ambulatory Visit (INDEPENDENT_AMBULATORY_CARE_PROVIDER_SITE_OTHER): Payer: No Typology Code available for payment source

## 2010-12-12 DIAGNOSIS — Z23 Encounter for immunization: Secondary | ICD-10-CM

## 2010-12-21 ENCOUNTER — Other Ambulatory Visit: Payer: Self-pay | Admitting: Internal Medicine

## 2011-01-02 ENCOUNTER — Ambulatory Visit: Payer: Self-pay | Admitting: Internal Medicine

## 2011-01-11 ENCOUNTER — Ambulatory Visit: Payer: Self-pay | Admitting: Internal Medicine

## 2011-05-03 ENCOUNTER — Other Ambulatory Visit (INDEPENDENT_AMBULATORY_CARE_PROVIDER_SITE_OTHER): Payer: No Typology Code available for payment source | Admitting: *Deleted

## 2011-05-03 DIAGNOSIS — E785 Hyperlipidemia, unspecified: Secondary | ICD-10-CM

## 2011-05-03 DIAGNOSIS — I10 Essential (primary) hypertension: Secondary | ICD-10-CM

## 2011-05-03 LAB — TSH: TSH: 0.52 u[IU]/mL (ref 0.35–5.50)

## 2011-05-03 LAB — LIPID PANEL
HDL: 67.4 mg/dL (ref >39.00)
LDL Cholesterol: 91 mg/dL (ref 0–99)
VLDL: 16.8 mg/dL (ref 0.0–40.0)

## 2011-05-03 LAB — COMPREHENSIVE METABOLIC PANEL
ALT: 15 U/L (ref 0–35)
Alkaline Phosphatase: 57 U/L (ref 39–117)
Creatinine, Ser: 0.7 mg/dL (ref 0.4–1.2)
Sodium: 138 mEq/L (ref 135–145)
Total Bilirubin: 0.5 mg/dL (ref 0.3–1.2)
Total Protein: 6.6 g/dL (ref 6.0–8.3)

## 2011-05-04 ENCOUNTER — Encounter: Payer: Self-pay | Admitting: Internal Medicine

## 2011-05-10 ENCOUNTER — Ambulatory Visit (INDEPENDENT_AMBULATORY_CARE_PROVIDER_SITE_OTHER): Payer: No Typology Code available for payment source | Admitting: Internal Medicine

## 2011-05-10 ENCOUNTER — Encounter: Payer: Self-pay | Admitting: Internal Medicine

## 2011-05-10 DIAGNOSIS — E785 Hyperlipidemia, unspecified: Secondary | ICD-10-CM

## 2011-05-10 DIAGNOSIS — I10 Essential (primary) hypertension: Secondary | ICD-10-CM

## 2011-05-10 NOTE — Assessment & Plan Note (Signed)
Recent labs showed marked improvement in cholesterol. Encouraged patient to continue with efforts at healthy diet and regular physical activity. She will followup in 6 months with repeat lipid profile and LFTs.

## 2011-05-10 NOTE — Progress Notes (Signed)
Subjective:    Patient ID: Shelly Arias, female    DOB: Dec 11, 1945, 66 y.o.   MRN: [id number]  HPI 66 year old female with history of hypertension, hyperlipidemia presents for followup. She reports that she's been doing very well. She denies any complaints today. In regards to her hyperlipidemia, her dose of pravastatin was reduced to 40 mg daily. Her recent cholesterol testing showed improvement in her cholesterol with reduction in her dose. She notes that she has made some effort at improving her diet with increased intake of vegetables. She is planning to increase her physical activity as the weather becomes warmer the spring. She denies any noted side effects of the medication. In regards to her hypertension, she reports full compliance with her medicine. She denies any headache, chest pain, or palpitations.  Outpatient Encounter Prescriptions as of 05/10/2011  Medication Sig Dispense Refill  . Cholecalciferol (VITAMIN D-3 PO) Take by mouth daily.      . meloxicam (MOBIC) 7.5 MG tablet Take 7.5 mg by mouth 2 (two) times daily.      . metoprolol succinate (TOPROL-XL) 25 MG 24 hr tablet TAKE ONE BY MOUTH DAILY  90 tablet  1  . Misc Natural Products (COSAMIN ASU ADVANCED FORMULA PO) Take by mouth daily.      . pravastatin (PRAVACHOL) 40 MG tablet Take 1 tablet (40 mg total) by mouth daily.  90 tablet  3  . Probiotic Product (PROBIOTIC & ACIDOPHILUS EX ST PO) Take by mouth daily.      . zoledronic acid (RECLAST) 5 MG/100ML SOLN Inject 5 mg into the vein once. Yearly for osteoporosis         Review of Systems  Constitutional: Negative for fever, chills, appetite change, fatigue and unexpected weight change.  HENT: Negative for ear pain, congestion, sore throat, trouble swallowing, neck pain, voice change and sinus pressure.   Eyes: Negative for visual disturbance.  Respiratory: Negative for cough, shortness of breath, wheezing and stridor.   Cardiovascular: Negative for chest pain, palpitations  and leg swelling.  Gastrointestinal: Negative for nausea, vomiting, abdominal pain, diarrhea, constipation, blood in stool, abdominal distention and anal bleeding.  Genitourinary: Negative for dysuria and flank pain.  Musculoskeletal: Negative for myalgias, arthralgias and gait problem.  Skin: Negative for color change and rash.  Neurological: Negative for dizziness and headaches.  Hematological: Negative for adenopathy. Does not bruise/bleed easily.  Psychiatric/Behavioral: Negative for suicidal ideas, sleep disturbance and dysphoric mood. The patient is not nervous/anxious.    BP 118/62  Pulse 114  Temp(Src) 98 F (36.7 C) (Oral)  Ht 5' 1.5" (1.562 m)  Wt 168 lb (76.204 kg)  BMI 31.23 kg/m2  SpO2 98%     Objective:   Physical Exam  Constitutional: She is oriented to person, place, and time. She appears well-developed and well-nourished. No distress.  HENT:  Head: Normocephalic and atraumatic.  Right Ear: External ear normal.  Left Ear: External ear normal.  Nose: Nose normal.  Mouth/Throat: Oropharynx is clear and moist. No oropharyngeal exudate.  Eyes: Conjunctivae are normal. Pupils are equal, round, and reactive to light. Right eye exhibits no discharge. Left eye exhibits no discharge. No scleral icterus.  Neck: Normal range of motion. Neck supple. No tracheal deviation present. No thyromegaly present.  Cardiovascular: Normal rate, regular rhythm, normal heart sounds and intact distal pulses.  Exam reveals no gallop and no friction rub.   No murmur heard. Pulmonary/Chest: Effort normal and breath sounds normal. No respiratory distress. She has no wheezes. She has  no rales. She exhibits no tenderness.  Musculoskeletal: Normal range of motion. She exhibits no edema and no tenderness.  Lymphadenopathy:    She has no cervical adenopathy.  Neurological: She is alert and oriented to person, place, and time. No cranial nerve deficit. She exhibits normal muscle tone. Coordination  normal.  Skin: Skin is warm and dry. No rash noted. She is not diaphoretic. No erythema. No pallor.  Psychiatric: She has a normal mood and affect. Her behavior is normal. Judgment and thought content normal.          Assessment & Plan:

## 2011-05-10 NOTE — Assessment & Plan Note (Signed)
Blood pressure well-controlled today. We'll plan to continue current medications. Followup in 6 months.

## 2011-05-18 ENCOUNTER — Encounter: Payer: Self-pay | Admitting: Internal Medicine

## 2011-06-20 ENCOUNTER — Other Ambulatory Visit: Payer: Self-pay | Admitting: *Deleted

## 2011-06-20 MED ORDER — METOPROLOL SUCCINATE ER 25 MG PO TB24: 25.0000 mg | ORAL_TABLET | Freq: Every day | ORAL | Status: DC

## 2011-07-03 ENCOUNTER — Ambulatory Visit: Payer: Self-pay | Admitting: Internal Medicine

## 2011-07-03 LAB — CBC CANCER CENTER
Eosinophil %: 1.6 %
Lymphocyte #: 1.2 x10 3/mm (ref 1.0–3.6)
Lymphocyte %: 23.4 %
MCH: 30.7 pg (ref 26.0–34.0)
MCV: 93 fL (ref 80–100)
Monocyte %: 7.5 %
Neutrophil %: 66.8 %
Platelet: 226 x10 3/mm (ref 150–440)
RDW: 13.7 % (ref 11.5–14.5)
WBC: 5.2 x10 3/mm (ref 3.6–11.0)

## 2011-07-03 LAB — CREATININE, SERUM
Creatinine: 0.74 mg/dL (ref 0.60–1.30)
EGFR (African American): >60
EGFR (Non-African Amer.): >60

## 2011-07-03 LAB — HEPATIC FUNCTION PANEL A (ARMC)
Albumin: 4 g/dL (ref 3.4–5.0)
Alkaline Phosphatase: 91 U/L (ref 50–136)
SGOT(AST): 19 U/L (ref 15–37)
SGPT (ALT): 24 U/L
Total Protein: 7.4 g/dL (ref 6.4–8.2)

## 2011-07-11 ENCOUNTER — Ambulatory Visit: Payer: Self-pay | Admitting: Internal Medicine

## 2011-08-02 ENCOUNTER — Telehealth: Payer: Self-pay | Admitting: *Deleted

## 2011-08-02 MED ORDER — NYSTATIN 100000 UNIT/GM EX POWD: CUTANEOUS | Status: DC

## 2011-08-02 NOTE — Telephone Encounter (Signed)
Fine to call this in for her.

## 2011-08-02 NOTE — Telephone Encounter (Signed)
Request for Nystop 100,000 units/GM powder Sig: Apply twice daily PRN. Cannot locate in EMR; please advise.

## 2011-08-02 NOTE — Telephone Encounter (Signed)
Done

## 2011-10-04 ENCOUNTER — Ambulatory Visit: Payer: Self-pay | Admitting: Internal Medicine

## 2011-10-08 LAB — HM MAMMOGRAPHY: HM Mammogram: NORMAL

## 2011-11-05 ENCOUNTER — Other Ambulatory Visit: Payer: No Typology Code available for payment source

## 2011-11-07 ENCOUNTER — Telehealth: Payer: Self-pay | Admitting: *Deleted

## 2011-11-07 ENCOUNTER — Encounter: Payer: No Typology Code available for payment source | Admitting: Internal Medicine

## 2011-11-07 ENCOUNTER — Other Ambulatory Visit (INDEPENDENT_AMBULATORY_CARE_PROVIDER_SITE_OTHER): Payer: No Typology Code available for payment source | Admitting: *Deleted

## 2011-11-07 DIAGNOSIS — Z0000 Encounter for general adult medical examination without abnormal findings: Secondary | ICD-10-CM

## 2011-11-07 LAB — CBC WITH DIFFERENTIAL/PLATELET
Eosinophils Relative: 2 % (ref 0–5)
Hemoglobin: 13.5 g/dL (ref 12.0–15.0)
Lymphocytes Relative: 30 % (ref 12–46)
Lymphs Abs: 1.4 10*3/uL (ref 0.7–4.0)
MCV: 88.7 fL (ref 78.0–100.0)
Monocytes Relative: 9 % (ref 3–12)
Neutrophils Relative %: 59 % (ref 43–77)
Platelets: 238 10*3/uL (ref 150–400)
RBC: 4.52 MIL/uL (ref 3.87–5.11)
WBC: 4.6 10*3/uL (ref 4.0–10.5)

## 2011-11-07 LAB — COMPREHENSIVE METABOLIC PANEL
Alkaline Phosphatase: 56 U/L (ref 39–117)
BUN: 10 mg/dL (ref 6–23)
CO2: 28 mEq/L (ref 19–32)
Glucose, Bld: 102 mg/dL — ABNORMAL HIGH (ref 70–99)
Sodium: 140 mEq/L (ref 135–145)
Total Bilirubin: 0.6 mg/dL (ref 0.3–1.2)
Total Protein: 6.1 g/dL (ref 6.0–8.3)

## 2011-11-07 LAB — TSH: TSH: 0.995 u[IU]/mL (ref 0.350–4.500)

## 2011-11-07 LAB — HEMOGLOBIN A1C: Mean Plasma Glucose: 128 mg/dL — ABNORMAL HIGH (ref <117)

## 2011-11-07 NOTE — Telephone Encounter (Signed)
Patient came in today for cpx labs, what would you like for me to order?

## 2011-11-07 NOTE — Telephone Encounter (Signed)
Labs ordered.

## 2011-11-07 NOTE — Telephone Encounter (Signed)
Sent to [employer].

## 2011-11-07 NOTE — Telephone Encounter (Signed)
CMP, CBC, TSH, A1c, Vit D, lipid

## 2011-11-08 ENCOUNTER — Ambulatory Visit (INDEPENDENT_AMBULATORY_CARE_PROVIDER_SITE_OTHER): Payer: No Typology Code available for payment source | Admitting: Internal Medicine

## 2011-11-08 ENCOUNTER — Encounter: Payer: Self-pay | Admitting: Internal Medicine

## 2011-11-08 VITALS — BP 144/90 | HR 68 | Temp 97.9°F | Ht 61.5 in | Wt 175.5 lb

## 2011-11-08 DIAGNOSIS — E785 Hyperlipidemia, unspecified: Secondary | ICD-10-CM

## 2011-11-08 DIAGNOSIS — M8580 Other specified disorders of bone density and structure, unspecified site: Secondary | ICD-10-CM | POA: Insufficient documentation

## 2011-11-08 DIAGNOSIS — M810 Age-related osteoporosis without current pathological fracture: Secondary | ICD-10-CM

## 2011-11-08 DIAGNOSIS — Z0000 Encounter for general adult medical examination without abnormal findings: Secondary | ICD-10-CM

## 2011-11-08 LAB — VITAMIN D 25 HYDROXY (VIT D DEFICIENCY, FRACTURES): Vit D, 25-Hydroxy: 53 ng/mL (ref 30–89)

## 2011-11-08 MED ORDER — PRAVASTATIN SODIUM 40 MG PO TABS: 40.0000 mg | ORAL_TABLET | Freq: Every day | ORAL | Status: DC

## 2011-11-08 NOTE — Assessment & Plan Note (Signed)
General medical exam including breast exam is normal today. Pap is deferred because completed August 2012 was normal. Mammogram is up-to-date. Colonoscopy is up to date and due in 2019. Vaccinations do include shingles vaccine and patient will look into insurance coverage for this. Reviewed recent lab work showing slightly elevated blood sugar, normal kidney and liver function. Followup in 6 months.

## 2011-11-08 NOTE — Progress Notes (Signed)
Subjective:    Patient ID: Shelly Arias, female    DOB: [birth year], 66 y.o.   MRN: [id number]  HPI 66 year old female with history of hypertension presents for annual exam. She reports she is feeling well. She denies any concerns today. She follows a relatively healthy diet and tries to get physical activity. She is up-to-date on health maintenance including Pap smear, mammogram, and colonoscopy. She notes that she is due for bone density screening. She currently receives Reclast for osteoporosis.  Outpatient Encounter Prescriptions as of 11/08/2011  Medication Sig Dispense Refill  . Cholecalciferol (VITAMIN D-3 PO) Take by mouth daily.      . meloxicam (MOBIC) 7.5 MG tablet Take 7.5 mg by mouth 2 (two) times daily.      . metoprolol succinate (TOPROL-XL) 25 MG 24 hr tablet Take 1 tablet (25 mg total) by mouth daily.  90 tablet  1  . Misc Natural Products (COSAMIN ASU ADVANCED FORMULA PO) Take by mouth daily.      Marland Kitchen nystatin (MYCOSTATIN/NYSTOP) 100000 UNIT/GM POWD Apply topically twice daily as needed.  30 g  0  . pravastatin (PRAVACHOL) 40 MG tablet Take 1 tablet (40 mg total) by mouth daily.  90 tablet  3  . Probiotic Product (PROBIOTIC & ACIDOPHILUS EX ST PO) Take by mouth daily.      . zoledronic acid (RECLAST) 5 MG/100ML SOLN Inject 5 mg into the vein once. Yearly for osteoporosis       . DISCONTD: pravastatin (PRAVACHOL) 40 MG tablet Take 1 tablet (40 mg total) by mouth daily.  90 tablet  3    Review of Systems  Constitutional: Negative for fever, chills, appetite change, fatigue and unexpected weight change.  HENT: Negative for ear pain, congestion, sore throat, trouble swallowing, neck pain, voice change and sinus pressure.   Eyes: Negative for visual disturbance.  Respiratory: Negative for cough, shortness of breath, wheezing and stridor.   Cardiovascular: Negative for chest pain, palpitations and leg swelling.  Gastrointestinal: Negative for nausea, vomiting, abdominal pain,  diarrhea, constipation, blood in stool, abdominal distention and anal bleeding.  Genitourinary: Negative for dysuria and flank pain.  Musculoskeletal: Negative for myalgias, arthralgias and gait problem.  Skin: Negative for color change and rash.  Neurological: Negative for dizziness and headaches.  Hematological: Negative for adenopathy. Does not bruise/bleed easily.  Psychiatric/Behavioral: Negative for suicidal ideas, disturbed wake/sleep cycle and dysphoric mood. The patient is not nervous/anxious.        Objective:   Physical Exam  Constitutional: She is oriented to person, place, and time. She appears well-developed and well-nourished. No distress.  HENT:  Head: Normocephalic and atraumatic.  Right Ear: External ear normal.  Left Ear: External ear normal.  Nose: Nose normal.  Mouth/Throat: Oropharynx is clear and moist. No oropharyngeal exudate.  Eyes: Conjunctivae are normal. Pupils are equal, round, and reactive to light. Right eye exhibits no discharge. Left eye exhibits no discharge. No scleral icterus.  Neck: Normal range of motion. Neck supple. No tracheal deviation present. No thyromegaly present.  Cardiovascular: Normal rate, regular rhythm, normal heart sounds and intact distal pulses.  Exam reveals no gallop and no friction rub.   No murmur heard. Pulmonary/Chest: Effort normal and breath sounds normal. No respiratory distress. She has no decreased breath sounds. She has no wheezes. She has no rhonchi. She has no rales. She exhibits no tenderness. Right breast exhibits no inverted nipple, no mass, no nipple discharge, no skin change and no tenderness. Left breast exhibits no inverted  nipple, no mass, no nipple discharge, no skin change and no tenderness. Breasts are symmetrical.  Abdominal: Soft. Bowel sounds are normal. She exhibits no distension. There is no tenderness.  Musculoskeletal: Normal range of motion. She exhibits no edema and no tenderness.  Lymphadenopathy:     She has no cervical adenopathy.  Neurological: She is alert and oriented to person, place, and time. No cranial nerve deficit. She exhibits normal muscle tone. Coordination normal.  Skin: Skin is warm and dry. No rash noted. She is not diaphoretic. No erythema. No pallor.  Psychiatric: She has a normal mood and affect. Her behavior is normal. Judgment and thought content normal.          Assessment & Plan:

## 2011-11-17 ENCOUNTER — Other Ambulatory Visit: Payer: Self-pay | Admitting: Internal Medicine

## 2011-11-22 ENCOUNTER — Ambulatory Visit: Payer: Self-pay | Admitting: Internal Medicine

## 2011-11-28 ENCOUNTER — Encounter: Payer: Self-pay | Admitting: Internal Medicine

## 2011-12-07 LAB — HM DEXA SCAN

## 2011-12-15 ENCOUNTER — Other Ambulatory Visit: Payer: Self-pay | Admitting: Internal Medicine

## 2011-12-27 ENCOUNTER — Ambulatory Visit: Payer: No Typology Code available for payment source

## 2012-01-01 ENCOUNTER — Ambulatory Visit (INDEPENDENT_AMBULATORY_CARE_PROVIDER_SITE_OTHER): Payer: No Typology Code available for payment source

## 2012-01-01 DIAGNOSIS — Z23 Encounter for immunization: Secondary | ICD-10-CM

## 2012-05-01 ENCOUNTER — Other Ambulatory Visit (INDEPENDENT_AMBULATORY_CARE_PROVIDER_SITE_OTHER): Payer: No Typology Code available for payment source

## 2012-05-01 DIAGNOSIS — E785 Hyperlipidemia, unspecified: Secondary | ICD-10-CM

## 2012-05-01 LAB — LIPID PANEL
Cholesterol: 168 mg/dL (ref 0–200)
LDL Cholesterol: 89 mg/dL (ref 0–99)

## 2012-05-01 LAB — COMPREHENSIVE METABOLIC PANEL
Albumin: 3.7 g/dL (ref 3.5–5.2)
CO2: 28 mEq/L (ref 19–32)
GFR: 72.41 mL/min (ref >60.00)
Glucose, Bld: 83 mg/dL (ref 70–99)
Potassium: 4.7 mEq/L (ref 3.5–5.1)
Sodium: 137 mEq/L (ref 135–145)
Total Protein: 6.7 g/dL (ref 6.0–8.3)

## 2012-05-08 ENCOUNTER — Ambulatory Visit: Payer: No Typology Code available for payment source | Admitting: Internal Medicine

## 2012-05-08 ENCOUNTER — Encounter: Payer: Self-pay | Admitting: Internal Medicine

## 2012-05-08 ENCOUNTER — Ambulatory Visit (INDEPENDENT_AMBULATORY_CARE_PROVIDER_SITE_OTHER): Payer: No Typology Code available for payment source | Admitting: Internal Medicine

## 2012-05-08 VITALS — BP 140/80 | HR 88 | Temp 98.2°F | Wt 171.0 lb

## 2012-05-08 DIAGNOSIS — F17200 Nicotine dependence, unspecified, uncomplicated: Secondary | ICD-10-CM

## 2012-05-08 DIAGNOSIS — E785 Hyperlipidemia, unspecified: Secondary | ICD-10-CM

## 2012-05-08 DIAGNOSIS — I10 Essential (primary) hypertension: Secondary | ICD-10-CM

## 2012-05-08 DIAGNOSIS — M810 Age-related osteoporosis without current pathological fracture: Secondary | ICD-10-CM

## 2012-05-08 NOTE — Assessment & Plan Note (Signed)
Lab Results  Component Value Date   LDLCALC 89 05/01/2012   Lipids improved. Will continue pravastatin.

## 2012-05-08 NOTE — Progress Notes (Signed)
Subjective:    Patient ID: Shelly Arias, female    DOB: 11-19-1945, 67 y.o.   MRN: [id number]  HPI 67YO female with h/o HL, HTN presents for follow up.  HL - lipids improved with LDL 89. Following healthy diet and trying to get regular exercise. Compliant with meds. No noted side effects.  HTN - BP generally well controlled. No chest pain, palpitations, headache.  Osteoporosis - Recent bone density test normal. On reclast x2 years and oral bisphosphonates before that.  Outpatient Encounter Prescriptions as of 05/08/2012  Medication Sig Dispense Refill  . Cholecalciferol (VITAMIN D-3 PO) Take by mouth daily.      . metoprolol succinate (TOPROL-XL) 25 MG 24 hr tablet TAKE 1 TABLET (25 MG TOTAL) BY MOUTH DAILY.  90 tablet  1  . Misc Natural Products (COSAMIN ASU ADVANCED FORMULA PO) Take by mouth daily.      Marland Kitchen nystatin (MYCOSTATIN/NYSTOP) 100000 UNIT/GM POWD Apply topically twice daily as needed.  30 g  0  . pravastatin (PRAVACHOL) 40 MG tablet Take 1 tablet (40 mg total) by mouth daily.  90 tablet  3  . Probiotic Product (PROBIOTIC & ACIDOPHILUS EX ST PO) Take by mouth daily.      . zoledronic acid (RECLAST) 5 MG/100ML SOLN Inject 5 mg into the vein once. Yearly for osteoporosis       . meloxicam (MOBIC) 7.5 MG tablet Take 7.5 mg by mouth 2 (two) times daily.      . [DISCONTINUED] pravastatin (PRAVACHOL) 40 MG tablet TAKE 1 TABLET (40 MG TOTAL) BY MOUTH DAILY.  90 tablet  3   No facility-administered encounter medications on file as of 05/08/2012.   BP 140/80  Pulse 88  Temp(Src) 98.2 F (36.8 C) (Oral)  Wt 171 lb (77.565 kg)  BMI 31.79 kg/m2  SpO2 95%  Review of Systems  Constitutional: Negative for fever, chills, appetite change, fatigue and unexpected weight change.  HENT: Negative for ear pain, congestion, sore throat, trouble swallowing, neck pain, voice change and sinus pressure.   Eyes: Negative for visual disturbance.  Respiratory: Negative for cough, shortness of breath,  wheezing and stridor.   Cardiovascular: Negative for chest pain, palpitations and leg swelling.  Gastrointestinal: Negative for nausea, vomiting, abdominal pain, diarrhea, constipation, blood in stool, abdominal distention and anal bleeding.  Genitourinary: Negative for dysuria and flank pain.  Musculoskeletal: Negative for myalgias, arthralgias and gait problem.  Skin: Negative for color change and rash.  Neurological: Negative for dizziness and headaches.  Hematological: Negative for adenopathy. Does not bruise/bleed easily.  Psychiatric/Behavioral: Negative for suicidal ideas, sleep disturbance and dysphoric mood. The patient is not nervous/anxious.        Objective:   Physical Exam  Constitutional: She is oriented to person, place, and time. She appears well-developed and well-nourished. No distress.  HENT:  Head: Normocephalic and atraumatic.  Right Ear: External ear normal.  Left Ear: External ear normal.  Nose: Nose normal.  Mouth/Throat: Oropharynx is clear and moist. No oropharyngeal exudate.  Eyes: Conjunctivae are normal. Pupils are equal, round, and reactive to light. Right eye exhibits no discharge. Left eye exhibits no discharge. No scleral icterus.  Neck: Normal range of motion. Neck supple. No tracheal deviation present. No thyromegaly present.  Cardiovascular: Normal rate, regular rhythm, normal heart sounds and intact distal pulses.  Exam reveals no gallop and no friction rub.   No murmur heard. Pulmonary/Chest: Effort normal and breath sounds normal. No respiratory distress. She has no wheezes. She has no  rales. She exhibits no tenderness.  Musculoskeletal: Normal range of motion. She exhibits no edema and no tenderness.  Lymphadenopathy:    She has no cervical adenopathy.  Neurological: She is alert and oriented to person, place, and time. No cranial nerve deficit. She exhibits normal muscle tone. Coordination normal.  Skin: Skin is warm and dry. No rash noted. She  is not diaphoretic. No erythema. No pallor.  Psychiatric: She has a normal mood and affect. Her behavior is normal. Judgment and thought content normal.          Assessment & Plan:

## 2012-05-08 NOTE — Assessment & Plan Note (Signed)
Recent bone density test was normal. Recommended stopping Reclast with plans to repeat bone density test in 2015.

## 2012-05-08 NOTE — Assessment & Plan Note (Signed)
BP Readings from Last 3 Encounters:  05/08/12 140/80  11/08/11 144/90  05/10/11 118/62   BP generally well controlled on current medication. Will continue.

## 2012-05-08 NOTE — Assessment & Plan Note (Signed)
Encouraged smoking cessation. Will set up screening CT Chest per new guidelines.

## 2012-05-12 ENCOUNTER — Ambulatory Visit: Payer: No Typology Code available for payment source | Admitting: Internal Medicine

## 2012-06-10 ENCOUNTER — Ambulatory Visit: Payer: Self-pay | Admitting: Internal Medicine

## 2012-06-15 ENCOUNTER — Other Ambulatory Visit: Payer: Self-pay | Admitting: Internal Medicine

## 2012-06-16 ENCOUNTER — Other Ambulatory Visit: Payer: Self-pay | Admitting: Internal Medicine

## 2012-06-16 NOTE — Telephone Encounter (Signed)
Eprescribed.

## 2012-07-02 LAB — CBC CANCER CENTER
Basophil %: 0.6 %
Eosinophil #: 0.1 x10 3/mm (ref 0.0–0.7)
HGB: 14.4 g/dL (ref 12.0–16.0)
Lymphocyte %: 23.2 %
MCV: 90 fL (ref 80–100)
Neutrophil #: 4 x10 3/mm (ref 1.4–6.5)
Neutrophil %: 67.4 %
Platelet: 234 x10 3/mm (ref 150–440)
RDW: 13.7 % (ref 11.5–14.5)
WBC: 5.9 x10 3/mm (ref 3.6–11.0)

## 2012-07-02 LAB — HEPATIC FUNCTION PANEL A (ARMC)
Alkaline Phosphatase: 84 U/L (ref 50–136)
Bilirubin, Direct: 0.1 mg/dL (ref 0.00–0.20)
SGOT(AST): 18 U/L (ref 15–37)
SGPT (ALT): 25 U/L (ref 12–78)
Total Protein: 7.4 g/dL (ref 6.4–8.2)

## 2012-07-02 LAB — CREATININE, SERUM
Creatinine: 0.88 mg/dL (ref 0.60–1.30)
EGFR (African American): >60

## 2012-07-03 LAB — CANCER ANTIGEN 27.29: CA 27.29: 22.5 U/mL (ref 0.0–38.6)

## 2012-07-10 ENCOUNTER — Ambulatory Visit: Payer: Self-pay | Admitting: Internal Medicine

## 2012-10-13 ENCOUNTER — Ambulatory Visit: Payer: Self-pay | Admitting: Internal Medicine

## 2012-10-13 DIAGNOSIS — R928 Other abnormal and inconclusive findings on diagnostic imaging of breast: Secondary | ICD-10-CM | POA: Diagnosis not present

## 2012-10-13 DIAGNOSIS — Z853 Personal history of malignant neoplasm of breast: Secondary | ICD-10-CM | POA: Diagnosis not present

## 2012-10-29 ENCOUNTER — Other Ambulatory Visit: Payer: Self-pay | Admitting: *Deleted

## 2012-10-29 ENCOUNTER — Telehealth: Payer: Self-pay | Admitting: *Deleted

## 2012-10-29 DIAGNOSIS — I10 Essential (primary) hypertension: Secondary | ICD-10-CM

## 2012-10-29 DIAGNOSIS — E785 Hyperlipidemia, unspecified: Secondary | ICD-10-CM

## 2012-10-29 NOTE — Telephone Encounter (Signed)
CMP, urine microalbumin, lipids, a1c 401.9, 272.4, 790.2

## 2012-10-29 NOTE — Telephone Encounter (Signed)
Pt is coming in tomorrow 08.21.2014 for labs, what labs and dx?

## 2012-10-30 ENCOUNTER — Other Ambulatory Visit (INDEPENDENT_AMBULATORY_CARE_PROVIDER_SITE_OTHER): Payer: Medicare Other

## 2012-10-30 DIAGNOSIS — E785 Hyperlipidemia, unspecified: Secondary | ICD-10-CM

## 2012-10-30 DIAGNOSIS — I10 Essential (primary) hypertension: Secondary | ICD-10-CM | POA: Diagnosis not present

## 2012-10-30 LAB — COMPREHENSIVE METABOLIC PANEL
AST: 19 U/L (ref 0–37)
Albumin: 3.8 g/dL (ref 3.5–5.2)
Alkaline Phosphatase: 58 U/L (ref 39–117)
BUN: 11 mg/dL (ref 6–23)
Creatinine, Ser: 0.8 mg/dL (ref 0.4–1.2)
Glucose, Bld: 103 mg/dL — ABNORMAL HIGH (ref 70–99)
Total Bilirubin: 0.6 mg/dL (ref 0.3–1.2)

## 2012-10-30 LAB — LIPID PANEL
Cholesterol: 161 mg/dL (ref 0–200)
HDL: 57.5 mg/dL (ref >39.00)
LDL Cholesterol: 86 mg/dL (ref 0–99)
Total CHOL/HDL Ratio: 3
Triglycerides: 90 mg/dL (ref 0.0–149.0)
VLDL: 18 mg/dL (ref 0.0–40.0)

## 2012-10-30 LAB — MICROALBUMIN / CREATININE URINE RATIO: Creatinine,U: 267.8 mg/dL

## 2012-11-06 ENCOUNTER — Ambulatory Visit: Payer: Self-pay | Admitting: Internal Medicine

## 2012-11-06 ENCOUNTER — Encounter: Payer: Self-pay | Admitting: Internal Medicine

## 2012-11-06 ENCOUNTER — Telehealth: Payer: Self-pay | Admitting: Internal Medicine

## 2012-11-06 ENCOUNTER — Ambulatory Visit (INDEPENDENT_AMBULATORY_CARE_PROVIDER_SITE_OTHER): Payer: Medicare Other | Admitting: Internal Medicine

## 2012-11-06 VITALS — BP 120/78 | HR 86 | Temp 98.0°F | Ht 61.25 in | Wt 169.0 lb

## 2012-11-06 DIAGNOSIS — Z720 Tobacco use: Secondary | ICD-10-CM

## 2012-11-06 DIAGNOSIS — Z0000 Encounter for general adult medical examination without abnormal findings: Secondary | ICD-10-CM

## 2012-11-06 DIAGNOSIS — I10 Essential (primary) hypertension: Secondary | ICD-10-CM | POA: Diagnosis not present

## 2012-11-06 DIAGNOSIS — J449 Chronic obstructive pulmonary disease, unspecified: Secondary | ICD-10-CM | POA: Diagnosis not present

## 2012-11-06 DIAGNOSIS — F17200 Nicotine dependence, unspecified, uncomplicated: Secondary | ICD-10-CM | POA: Diagnosis not present

## 2012-11-06 DIAGNOSIS — E785 Hyperlipidemia, unspecified: Secondary | ICD-10-CM

## 2012-11-06 DIAGNOSIS — Z23 Encounter for immunization: Secondary | ICD-10-CM

## 2012-11-06 DIAGNOSIS — Z1389 Encounter for screening for other disorder: Secondary | ICD-10-CM | POA: Diagnosis not present

## 2012-11-06 DIAGNOSIS — Z853 Personal history of malignant neoplasm of breast: Secondary | ICD-10-CM

## 2012-11-06 MED ORDER — PRAVASTATIN SODIUM 40 MG PO TABS: 40.0000 mg | ORAL_TABLET | Freq: Every day | ORAL | Status: DC

## 2012-11-06 NOTE — Telephone Encounter (Signed)
CXR showed changes consistent with COPD/emphysema, but no other abnormalities. I would still recommend to get CT chest when pt able to do so.

## 2012-11-06 NOTE — Assessment & Plan Note (Signed)
BP Readings from Last 3 Encounters:  11/06/12 120/78  05/08/12 140/80  11/08/11 144/90   BP well controlled on Metoprolol. Will continue.

## 2012-11-06 NOTE — Progress Notes (Signed)
Subjective:    Patient ID: Shelly Arias, female    DOB: [birth year], 67 y.o.   MRN: [id number]  HPI The patient is here for annual Medicare wellness examination and management of other chronic and acute problems.   The risk factors are reflected in the social history.  The roster of all physicians providing medical care to patient - is listed in the Snapshot section of the chart.  Activities of daily living:  The patient is 100% independent in all ADLs: dressing, toileting, feeding as well as independent mobility  Home safety : The patient has smoke detectors in the home. They wear seatbelts.  There are no firearms at home. There is no violence in the home.   There is no risks for hepatitis, STDs or HIV. There is a history of blood transfusion during hip surgery. They have no travel history to infectious disease endemic areas of the world.  The patient has seen their dentist in the last six month. Dentist - Dr. Rubye Oaks. They have seen their eye doctor in the last year. Opthalmologist - Dr. Genice Rouge. No issues with hearing.  They have deferred audiologic testing in the last year.  They do not  have excessive sun exposure. Discussed the need for sun protection: hats, long sleeves and use of sunscreen if there is significant sun exposure. Dermatologist - Dr. Wende Neighbors. Orthopedics - Dr. Hayden Rasmussen General Surgeon - Dr. Adela Glimpse Oncologist - Dr. Sherrlyn Hock  Diet: the importance of a healthy diet is discussed. They do have a regular healthy diet.  The benefits of regular aerobic exercise were discussed. She does not exercise.  Depression screen: there are no signs or vegative symptoms of depression- irritability, change in appetite, anhedonia, sadness/tearfullness.  Cognitive assessment: the patient manages all their financial and personal affairs and is actively engaged. They could relate day,date,year and events.  The following portions of the patient's history were reviewed and updated as  appropriate: allergies, current medications, past family history, past medical history,  past surgical history, past social history  and problem list.  Visual acuity was not assessed per patient preference since she has regular follow up with her ophthalmologist. Hearing and body mass index were assessed and reviewed.   During the course of the visit the patient was educated and counseled about appropriate screening and preventive services including : fall prevention , diabetes screening, nutrition counseling, colorectal cancer screening, and recommended immunizations.    Living Will in place. HCPOA - Guy Franco  Review of Systems  Constitutional: Negative for fever, chills, appetite change, fatigue and unexpected weight change.  HENT: Negative for ear pain, congestion, sore throat, trouble swallowing, neck pain, voice change and sinus pressure.   Eyes: Negative for visual disturbance.  Respiratory: Negative for cough, shortness of breath, wheezing and stridor.   Cardiovascular: Negative for chest pain, palpitations and leg swelling.  Gastrointestinal: Negative for nausea, vomiting, abdominal pain, diarrhea, constipation, blood in stool, abdominal distention and anal bleeding.  Genitourinary: Negative for dysuria and flank pain.  Musculoskeletal: Positive for arthralgias (occasional left hip). Negative for myalgias and gait problem.  Skin: Negative for color change and rash.  Neurological: Negative for dizziness and headaches.  Hematological: Negative for adenopathy. Does not bruise/bleed easily.  Psychiatric/Behavioral: Negative for suicidal ideas, sleep disturbance and dysphoric mood. The patient is not nervous/anxious.        Objective:   Physical Exam  Constitutional: She is oriented to person, place, and time. She appears well-developed and well-nourished. No distress.  HENT:  Head: Normocephalic and atraumatic.  Right Ear: External ear normal.  Left Ear: External ear normal.   Nose: Nose normal.  Mouth/Throat: Oropharynx is clear and moist. No oropharyngeal exudate.  Eyes: Conjunctivae are normal. Pupils are equal, round, and reactive to light. Right eye exhibits no discharge. Left eye exhibits no discharge. No scleral icterus.  Neck: Normal range of motion. Neck supple. No tracheal deviation present. No thyromegaly present.  Cardiovascular: Normal rate, regular rhythm, normal heart sounds and intact distal pulses.  Exam reveals no gallop and no friction rub.   No murmur heard. Pulmonary/Chest: Effort normal and breath sounds normal. No accessory muscle usage. Not tachypneic. No respiratory distress. She has no decreased breath sounds. She has no wheezes. She has no rhonchi. She has no rales. She exhibits no tenderness. Right breast exhibits no inverted nipple, no mass, no nipple discharge, no skin change and no tenderness. Left breast exhibits no inverted nipple, no mass, no nipple discharge, no skin change and no tenderness. Breasts are symmetrical.  Abdominal: Soft. Bowel sounds are normal. She exhibits no distension and no mass. There is no tenderness. There is no rebound and no guarding.  Musculoskeletal: Normal range of motion. She exhibits no edema and no tenderness.  Lymphadenopathy:    She has no cervical adenopathy.  Neurological: She is alert and oriented to person, place, and time. No cranial nerve deficit. She exhibits normal muscle tone. Coordination normal.  Skin: Skin is warm and dry. No rash noted. She is not diaphoretic. No erythema. No pallor.  Psychiatric: She has a normal mood and affect. Her behavior is normal. Judgment and thought content normal.          Assessment & Plan:

## 2012-11-06 NOTE — Assessment & Plan Note (Signed)
Lipids well controlled on Pravastatin. Will continue.

## 2012-11-06 NOTE — Assessment & Plan Note (Signed)
General medical exam including breast exam normal today. Reviewed recent labs including CBC, CMP, lipid profile which were normal. Mammogram is up-to-date. Colonoscopy is up-to-date. Pneumovax and Tdap given today. Encouraged continued efforts at healthy diet and regular physical activity with goal of 40 minutes 3 times per week. Strongly encourage smoking cessation. Recommended screening for lung cancer with a CT of the chest without contrast. Given the potential cost, patient would like to defer for now. Will get chest x-ray. EKG today normal.

## 2012-11-06 NOTE — Addendum Note (Signed)
Addended by: Theola Sequin on: 11/06/2012 01:42 PM   Modules accepted: Orders

## 2012-11-06 NOTE — Assessment & Plan Note (Signed)
Encourage smoking cessation. Recommended screening for lung cancer with CT of the chest. Patient would prefer to hold off for now given potential cost. Will get chest x-ray.

## 2012-11-07 NOTE — Telephone Encounter (Signed)
LMTCB

## 2012-11-11 ENCOUNTER — Encounter: Payer: Self-pay | Admitting: *Deleted

## 2012-11-11 NOTE — Telephone Encounter (Signed)
Patient returned call, verbally informed of results. Per patient she will think about it and let us know.

## 2012-11-11 NOTE — Telephone Encounter (Signed)
Sent patient a Clinical cytogeneticist message with information.

## 2012-11-17 ENCOUNTER — Encounter: Payer: Self-pay | Admitting: Internal Medicine

## 2012-12-10 DIAGNOSIS — Z96649 Presence of unspecified artificial hip joint: Secondary | ICD-10-CM | POA: Diagnosis not present

## 2012-12-10 DIAGNOSIS — Z471 Aftercare following joint replacement surgery: Secondary | ICD-10-CM | POA: Diagnosis not present

## 2012-12-14 ENCOUNTER — Other Ambulatory Visit: Payer: Self-pay | Admitting: Internal Medicine

## 2012-12-15 ENCOUNTER — Other Ambulatory Visit: Payer: Self-pay | Admitting: Internal Medicine

## 2012-12-25 ENCOUNTER — Ambulatory Visit (INDEPENDENT_AMBULATORY_CARE_PROVIDER_SITE_OTHER): Payer: Medicare Other

## 2012-12-25 DIAGNOSIS — Z23 Encounter for immunization: Secondary | ICD-10-CM

## 2013-01-30 DIAGNOSIS — L0291 Cutaneous abscess, unspecified: Secondary | ICD-10-CM | POA: Diagnosis not present

## 2013-02-03 DIAGNOSIS — D485 Neoplasm of uncertain behavior of skin: Secondary | ICD-10-CM | POA: Diagnosis not present

## 2013-02-03 DIAGNOSIS — C44721 Squamous cell carcinoma of skin of unspecified lower limb, including hip: Secondary | ICD-10-CM | POA: Diagnosis not present

## 2013-05-04 DIAGNOSIS — Z85828 Personal history of other malignant neoplasm of skin: Secondary | ICD-10-CM | POA: Diagnosis not present

## 2013-05-04 DIAGNOSIS — C44721 Squamous cell carcinoma of skin of unspecified lower limb, including hip: Secondary | ICD-10-CM | POA: Diagnosis not present

## 2013-05-14 ENCOUNTER — Ambulatory Visit (INDEPENDENT_AMBULATORY_CARE_PROVIDER_SITE_OTHER): Payer: Medicare Other | Admitting: Internal Medicine

## 2013-05-14 ENCOUNTER — Encounter: Payer: Self-pay | Admitting: Internal Medicine

## 2013-05-14 VITALS — BP 130/86 | HR 85 | Temp 98.1°F | Wt 171.0 lb

## 2013-05-14 DIAGNOSIS — I10 Essential (primary) hypertension: Secondary | ICD-10-CM | POA: Diagnosis not present

## 2013-05-14 DIAGNOSIS — E785 Hyperlipidemia, unspecified: Secondary | ICD-10-CM

## 2013-05-14 LAB — COMPREHENSIVE METABOLIC PANEL
ALK PHOS: 59 U/L (ref 39–117)
ALT: 16 U/L (ref 0–35)
AST: 18 U/L (ref 0–37)
Albumin: 3.8 g/dL (ref 3.5–5.2)
BILIRUBIN TOTAL: 0.7 mg/dL (ref 0.3–1.2)
BUN: 9 mg/dL (ref 6–23)
CO2: 26 meq/L (ref 19–32)
CREATININE: 0.8 mg/dL (ref 0.4–1.2)
Calcium: 9.1 mg/dL (ref 8.4–10.5)
Chloride: 100 mEq/L (ref 96–112)
GFR: 82.26 mL/min (ref >60.00)
GLUCOSE: 92 mg/dL (ref 70–99)
Potassium: 4.7 mEq/L (ref 3.5–5.1)
SODIUM: 134 meq/L — AB (ref 135–145)
Total Protein: 6.9 g/dL (ref 6.0–8.3)

## 2013-05-14 MED ORDER — METOPROLOL SUCCINATE ER 25 MG PO TB24
25.0000 mg | ORAL_TABLET | Freq: Every day | ORAL | Status: DC
Start: 2013-05-14 — End: 2014-05-09

## 2013-05-14 NOTE — Progress Notes (Signed)
Subjective:    Patient ID: Shelly Arias, female    DOB: 10-24-1945, 68 y.o.   MRN: [id number]  HPI 68YO female with HTN and hyperlipidemia presents for follow up. Doing well. No concerns today. Continues to try to quit smoking. Has cut back to about 10 cigs per day.  Outpatient Encounter Prescriptions as of 05/14/2013  Medication Sig  . cetirizine (ZYRTEC) 10 MG tablet Take 10 mg by mouth daily.  . Cholecalciferol (VITAMIN D-3 PO) Take by mouth daily.  . metoprolol succinate (TOPROL-XL) 25 MG 24 hr tablet Take 1 tablet (25 mg total) by mouth daily.  . Misc Natural Products (COSAMIN ASU ADVANCED FORMULA PO) Take by mouth daily.  Marland Kitchen nystatin (MYCOSTATIN/NYSTOP) 100000 UNIT/GM POWD Apply topically twice daily as needed.  . pravastatin (PRAVACHOL) 40 MG tablet Take 1 tablet (40 mg total) by mouth daily.  . Probiotic Product (PROBIOTIC & ACIDOPHILUS EX ST PO) Take by mouth daily.  . zoledronic acid (RECLAST) 5 MG/100ML SOLN Inject 5 mg into the vein once. Yearly for osteoporosis    BP 130/86  Pulse 85  Temp(Src) 98.1 F (36.7 C) (Oral)  Wt 171 lb (77.565 kg)  SpO2 95%  Review of Systems  Constitutional: Negative for fever, chills, appetite change, fatigue and unexpected weight change.  HENT: Negative for congestion, ear pain, sinus pressure, sore throat, trouble swallowing and voice change.   Eyes: Negative for visual disturbance.  Respiratory: Negative for cough, shortness of breath, wheezing and stridor.   Cardiovascular: Negative for chest pain, palpitations and leg swelling.  Gastrointestinal: Negative for nausea, vomiting, abdominal pain, diarrhea, constipation, blood in stool, abdominal distention and anal bleeding.  Genitourinary: Negative for dysuria and flank pain.  Musculoskeletal: Negative for arthralgias, gait problem, myalgias and neck pain.  Skin: Negative for color change and rash.  Neurological: Negative for dizziness and headaches.  Hematological: Negative for  adenopathy. Does not bruise/bleed easily.  Psychiatric/Behavioral: Negative for suicidal ideas, sleep disturbance and dysphoric mood. The patient is not nervous/anxious.        Objective:    BP 130/86  Pulse 85  Temp(Src) 98.1 F (36.7 C) (Oral)  Wt 171 lb (77.565 kg)  SpO2 95% Physical Exam  Constitutional: She is oriented to person, place, and time. She appears well-developed and well-nourished. No distress.  HENT:  Head: Normocephalic and atraumatic.  Right Ear: External ear normal.  Left Ear: External ear normal.  Nose: Nose normal.  Mouth/Throat: Oropharynx is clear and moist. No oropharyngeal exudate.  Eyes: Conjunctivae are normal. Pupils are equal, round, and reactive to light. Right eye exhibits no discharge. Left eye exhibits no discharge. No scleral icterus.  Neck: Normal range of motion. Neck supple. No tracheal deviation present. No thyromegaly present.  Cardiovascular: Normal rate, regular rhythm, normal heart sounds and intact distal pulses.  Exam reveals no gallop and no friction rub.   No murmur heard. Pulmonary/Chest: Effort normal and breath sounds normal. No accessory muscle usage. Not tachypneic. No respiratory distress. She has no decreased breath sounds. She has no wheezes. She has no rhonchi. She has no rales. She exhibits no tenderness.  Musculoskeletal: Normal range of motion. She exhibits no edema and no tenderness.  Lymphadenopathy:    She has no cervical adenopathy.  Neurological: She is alert and oriented to person, place, and time. No cranial nerve deficit. She exhibits normal muscle tone. Coordination normal.  Skin: Skin is warm and dry. No rash noted. She is not diaphoretic. No erythema. No pallor.  Psychiatric:  She has a normal mood and affect. Her behavior is normal. Judgment and thought content normal.          Assessment & Plan:   Problem List Items Addressed This Visit   Hyperlipidemia     Will check LFTs with labs today. Continue  Pravastatin.    Relevant Medications      metoprolol succinate (TOPROL-XL) 24 hr tablet   Other Relevant Orders      Comp Met (CMET)   HYPERTENSION - Primary      BP Readings from Last 3 Encounters:  05/14/13 130/86  11/06/12 120/78  05/08/12 140/80   BP well controlled on current medication. Will continue. Check renal function today.        Return in about 6 months (around 11/14/2013) for Wellness Visit.

## 2013-05-14 NOTE — Assessment & Plan Note (Signed)
BP Readings from Last 3 Encounters:  05/14/13 130/86  11/06/12 120/78  05/08/12 140/80   BP well controlled on current medication. Will continue. Check renal function today.

## 2013-05-14 NOTE — Assessment & Plan Note (Signed)
Will check LFTs with labs today. Continue Pravastatin.

## 2013-05-14 NOTE — Progress Notes (Signed)
Pre visit review using our clinic review tool, if applicable. No additional management support is needed unless otherwise documented below in the visit note. 

## 2013-05-15 ENCOUNTER — Telehealth: Payer: Self-pay | Admitting: Internal Medicine

## 2013-05-15 NOTE — Telephone Encounter (Signed)
Relevant patient education assigned to patient using Emmi. ° °

## 2013-06-12 ENCOUNTER — Other Ambulatory Visit: Payer: Self-pay | Admitting: Internal Medicine

## 2013-07-01 ENCOUNTER — Ambulatory Visit: Payer: Self-pay | Admitting: Internal Medicine

## 2013-07-01 DIAGNOSIS — Z85820 Personal history of malignant melanoma of skin: Secondary | ICD-10-CM | POA: Diagnosis not present

## 2013-07-01 DIAGNOSIS — Z803 Family history of malignant neoplasm of breast: Secondary | ICD-10-CM | POA: Diagnosis not present

## 2013-07-01 DIAGNOSIS — Z79899 Other long term (current) drug therapy: Secondary | ICD-10-CM | POA: Diagnosis not present

## 2013-07-01 DIAGNOSIS — Z9221 Personal history of antineoplastic chemotherapy: Secondary | ICD-10-CM | POA: Diagnosis not present

## 2013-07-01 DIAGNOSIS — I10 Essential (primary) hypertension: Secondary | ICD-10-CM | POA: Diagnosis not present

## 2013-07-01 DIAGNOSIS — Z853 Personal history of malignant neoplasm of breast: Secondary | ICD-10-CM | POA: Diagnosis not present

## 2013-07-01 DIAGNOSIS — Z90710 Acquired absence of both cervix and uterus: Secondary | ICD-10-CM | POA: Diagnosis not present

## 2013-07-01 DIAGNOSIS — Z8249 Family history of ischemic heart disease and other diseases of the circulatory system: Secondary | ICD-10-CM | POA: Diagnosis not present

## 2013-07-01 DIAGNOSIS — F17200 Nicotine dependence, unspecified, uncomplicated: Secondary | ICD-10-CM | POA: Diagnosis not present

## 2013-07-01 LAB — CBC CANCER CENTER
BASOS ABS: 0 x10 3/mm (ref 0.0–0.1)
BASOS PCT: 0.6 %
EOS ABS: 0.1 x10 3/mm (ref 0.0–0.7)
EOS PCT: 1.1 %
HCT: 43.5 % (ref 35.0–47.0)
HGB: 14.5 g/dL (ref 12.0–16.0)
LYMPHS ABS: 1.4 x10 3/mm (ref 1.0–3.6)
LYMPHS PCT: 26.1 %
MCH: 30.4 pg (ref 26.0–34.0)
MCHC: 33.3 g/dL (ref 32.0–36.0)
MCV: 91 fL (ref 80–100)
MONO ABS: 0.4 x10 3/mm (ref 0.2–0.9)
MONOS PCT: 7.7 %
NEUTROS PCT: 64.5 %
Neutrophil #: 3.4 x10 3/mm (ref 1.4–6.5)
Platelet: 229 x10 3/mm (ref 150–440)
RBC: 4.77 10*6/uL (ref 3.80–5.20)
RDW: 13.7 % (ref 11.5–14.5)
WBC: 5.2 x10 3/mm (ref 3.6–11.0)

## 2013-07-01 LAB — CREATININE, SERUM
CREATININE: 0.9 mg/dL (ref 0.60–1.30)
EGFR (African American): >60

## 2013-07-01 LAB — HEPATIC FUNCTION PANEL A (ARMC)
ALK PHOS: 86 U/L
Albumin: 3.8 g/dL (ref 3.4–5.0)
BILIRUBIN TOTAL: 0.5 mg/dL (ref 0.2–1.0)
Bilirubin, Direct: 0.1 mg/dL (ref 0.00–0.20)
SGOT(AST): 15 U/L (ref 15–37)
SGPT (ALT): 19 U/L (ref 12–78)
Total Protein: 7.3 g/dL (ref 6.4–8.2)

## 2013-07-02 LAB — CANCER ANTIGEN 27.29: CA 27.29: 25.5 U/mL (ref 0.0–38.6)

## 2013-07-10 ENCOUNTER — Ambulatory Visit: Payer: Self-pay | Admitting: Internal Medicine

## 2013-10-14 ENCOUNTER — Ambulatory Visit: Payer: Self-pay | Admitting: Internal Medicine

## 2013-10-14 DIAGNOSIS — Z1231 Encounter for screening mammogram for malignant neoplasm of breast: Secondary | ICD-10-CM | POA: Diagnosis not present

## 2013-10-19 LAB — HM MAMMOGRAPHY: HM MAMMO: NORMAL

## 2013-11-10 ENCOUNTER — Other Ambulatory Visit: Payer: Self-pay | Admitting: Internal Medicine

## 2013-11-11 ENCOUNTER — Other Ambulatory Visit: Payer: Self-pay | Admitting: *Deleted

## 2013-11-11 ENCOUNTER — Telehealth: Payer: Self-pay | Admitting: *Deleted

## 2013-11-11 DIAGNOSIS — E785 Hyperlipidemia, unspecified: Secondary | ICD-10-CM

## 2013-11-11 DIAGNOSIS — E1059 Type 1 diabetes mellitus with other circulatory complications: Secondary | ICD-10-CM

## 2013-11-11 NOTE — Telephone Encounter (Signed)
CMP, A1c, lipids, urine microabumin 250.00, 272.4

## 2013-11-11 NOTE — Telephone Encounter (Signed)
Pt is coming in tomorrow what labs and dx?  

## 2013-11-12 ENCOUNTER — Other Ambulatory Visit (INDEPENDENT_AMBULATORY_CARE_PROVIDER_SITE_OTHER): Payer: No Typology Code available for payment source

## 2013-11-12 DIAGNOSIS — E785 Hyperlipidemia, unspecified: Secondary | ICD-10-CM | POA: Diagnosis not present

## 2013-11-12 DIAGNOSIS — E1059 Type 1 diabetes mellitus with other circulatory complications: Secondary | ICD-10-CM

## 2013-11-12 LAB — COMPREHENSIVE METABOLIC PANEL
ALT: 16 U/L (ref 0–35)
AST: 18 U/L (ref 0–37)
Albumin: 3.8 g/dL (ref 3.5–5.2)
Alkaline Phosphatase: 56 U/L (ref 39–117)
BUN: 7 mg/dL (ref 6–23)
CO2: 28 meq/L (ref 19–32)
CREATININE: 0.8 mg/dL (ref 0.4–1.2)
Calcium: 9.1 mg/dL (ref 8.4–10.5)
Chloride: 102 mEq/L (ref 96–112)
GFR: 74.1 mL/min (ref >60.00)
GLUCOSE: 95 mg/dL (ref 70–99)
POTASSIUM: 4.4 meq/L (ref 3.5–5.1)
Sodium: 137 mEq/L (ref 135–145)
Total Bilirubin: 0.7 mg/dL (ref 0.2–1.2)
Total Protein: 6.8 g/dL (ref 6.0–8.3)

## 2013-11-12 LAB — LIPID PANEL
CHOLESTEROL: 164 mg/dL (ref 0–200)
HDL: 58.5 mg/dL (ref >39.00)
LDL CALC: 83 mg/dL (ref 0–99)
NonHDL: 105.5
TRIGLYCERIDES: 114 mg/dL (ref 0.0–149.0)
Total CHOL/HDL Ratio: 3
VLDL: 22.8 mg/dL (ref 0.0–40.0)

## 2013-11-12 LAB — MICROALBUMIN / CREATININE URINE RATIO
Creatinine,U: 270.1 mg/dL
MICROALB UR: 1.1 mg/dL (ref 0.0–1.9)
Microalb Creat Ratio: 0.4 mg/g (ref 0.0–30.0)

## 2013-11-12 LAB — HEMOGLOBIN A1C: Hgb A1c MFr Bld: 6.4 % (ref 4.6–6.5)

## 2013-11-19 ENCOUNTER — Encounter: Payer: Medicare Other | Admitting: Internal Medicine

## 2013-11-19 ENCOUNTER — Encounter: Payer: Self-pay | Admitting: Internal Medicine

## 2013-11-19 ENCOUNTER — Ambulatory Visit (INDEPENDENT_AMBULATORY_CARE_PROVIDER_SITE_OTHER): Payer: Medicare Other | Admitting: Internal Medicine

## 2013-11-19 VITALS — BP 120/70 | HR 97 | Temp 97.9°F | Resp 14 | Ht 61.5 in | Wt 171.0 lb

## 2013-11-19 DIAGNOSIS — Z0000 Encounter for general adult medical examination without abnormal findings: Secondary | ICD-10-CM | POA: Insufficient documentation

## 2013-11-19 DIAGNOSIS — Z23 Encounter for immunization: Secondary | ICD-10-CM

## 2013-11-19 DIAGNOSIS — Z720 Tobacco use: Secondary | ICD-10-CM

## 2013-11-19 DIAGNOSIS — F17200 Nicotine dependence, unspecified, uncomplicated: Secondary | ICD-10-CM

## 2013-11-19 NOTE — Progress Notes (Signed)
Subjective:    Patient ID: Shelly Arias, female    DOB: 11/10/1945, 68 y.o.   MRN: [id number]  HPI The patient is here for annual Medicare wellness examination and management of other chronic and acute problems.   The risk factors are reflected in the social history.  The roster of all physicians providing medical care to patient - is listed in the Snapshot section of the chart.  Activities of daily living:  The patient is 100% independent in all ADLs: dressing, toileting, feeding as well as independent mobility. Lives in home with husband. No pets.  Home safety : The patient has smoke detectors in the home. They wear seatbelts.  There are no firearms at home. There is no violence in the home.   There is no risks for hepatitis, STDs or HIV. There is a history of blood transfusion during hip surgery. They have no travel history to infectious disease endemic areas of the world.  The patient has seen their dentist in the last six month. Dentist - Dr. Thalia Bloodgood. They have seen their eye doctor in the last year. Opthalmologist - Dr. Marvia Pickles. No issues with hearing.  They have deferred audiologic testing in the last year.  They do not  have excessive sun exposure. Discussed the need for sun protection: hats, long sleeves and use of sunscreen if there is significant sun exposure. Dermatologist - Dr. Nicole [facility] - Dr. Jule Economy General Surgeon - Dr. Job Founds Oncologist - Dr. Ma Hillock  Diet: the importance of a healthy diet is discussed. They do have a regular healthy diet.  The benefits of regular aerobic exercise were discussed. She does not exercise.  Depression screen: there are no signs or vegative symptoms of depression- irritability, change in appetite, anhedonia, sadness/tearfullness.  Cognitive assessment: the patient manages all their financial and personal affairs and is actively engaged. They could relate day,date,year and events.  The following portions of the patient's  history were reviewed and updated as appropriate: allergies, current medications, past family history, past medical history,  past surgical history, past social history  and problem list.  Visual acuity was not assessed per patient preference since she has regular follow up with her ophthalmologist. Hearing and body mass index were assessed and reviewed.   During the course of the visit the patient was educated and counseled about appropriate screening and preventive services including : fall prevention , diabetes screening, nutrition counseling, colorectal cancer screening, and recommended immunizations.    Living Will in place. [place]  Review of Systems  Constitutional: Negative for fever, chills, appetite change, fatigue and unexpected weight change.  Eyes: Negative for visual disturbance.  Respiratory: Negative for shortness of breath.   Cardiovascular: Negative for chest pain and leg swelling.  Gastrointestinal: Negative for nausea, vomiting, abdominal pain, diarrhea and constipation.  Musculoskeletal: Negative for arthralgias and myalgias.  Skin: Negative for color change and rash.  Hematological: Negative for adenopathy. Does not bruise/bleed easily.  Psychiatric/Behavioral: Negative for dysphoric mood. The patient is not nervous/anxious.        Objective:    BP 120/70  Pulse 97  Temp(Src) 97.9 F (36.6 C) (Oral)  Resp 14  Ht 5' 1.5" (1.562 m)  Wt 171 lb (77.565 kg)  BMI 31.79 kg/m2  SpO2 97% Physical Exam  Constitutional: She is oriented to person, place, and time. She appears well-developed and well-nourished. No distress.  HENT:  Head: Normocephalic and atraumatic.  Right Ear: External ear normal.  Left Ear: External  ear normal.  Nose: Nose normal.  Mouth/Throat: Oropharynx is clear and moist. No oropharyngeal exudate.  Eyes: Conjunctivae are normal. Pupils are equal, round, and reactive to light. Right eye exhibits no discharge. Left eye exhibits no  discharge. No scleral icterus.  Neck: Normal range of motion. Neck supple. No tracheal deviation present. No thyromegaly present.  Cardiovascular: Normal rate, regular rhythm, normal heart sounds and intact distal pulses.  Exam reveals no gallop and no friction rub.   No murmur heard. Pulmonary/Chest: Effort normal and breath sounds normal. No accessory muscle usage. Not tachypneic. No respiratory distress. She has no decreased breath sounds. She has no wheezes. She has no rales. She exhibits no tenderness. Right breast exhibits no inverted nipple, no mass, no nipple discharge, no skin change and no tenderness. Left breast exhibits no inverted nipple, no mass, no nipple discharge, no skin change and no tenderness. Breasts are symmetrical.  Abdominal: Soft. Bowel sounds are normal. She exhibits no distension and no mass. There is no tenderness. There is no rebound and no guarding.  Musculoskeletal: Normal range of motion. She exhibits no edema and no tenderness.  Lymphadenopathy:    She has no cervical adenopathy.  Neurological: She is alert and oriented to person, place, and time. No cranial nerve deficit. She exhibits normal muscle tone. Coordination normal.  Skin: Skin is warm and dry. No rash noted. She is not diaphoretic. No erythema. No pallor.  Psychiatric: She has a normal mood and affect. Her behavior is normal. Judgment and thought content normal.          Assessment & Plan:   Problem List Items Addressed This Visit     Unprioritized   Medicare annual wellness visit, subsequent - Primary     General medical exam including breast exam normal today. PAP and pelvic deferred as s/p hysterectomy. Mammogram UTD. Colonoscopy UTD. Bone density testing ordered. CT chest screening for lung cancer ordered. Reviewed recent labs. Encouraged healthy diet and exercise.    Relevant Orders      DG Bone Density    Other Visit Diagnoses   Need for prophylactic vaccination and inoculation against  influenza        Relevant Orders       Flu Vaccine QUAD 36+ mos PF IM (Fluarix Quad PF) (Completed)    Tobacco use        Relevant Orders       CT Chest Wo Contrast        Return in about 6 months (around 05/20/2014) for Recheck.

## 2013-11-19 NOTE — Assessment & Plan Note (Signed)
General medical exam including breast exam normal today. PAP and pelvic deferred as s/p hysterectomy. Mammogram UTD. Colonoscopy UTD. Bone density testing ordered. CT chest screening for lung cancer ordered. Reviewed recent labs. Encouraged healthy diet and exercise.

## 2013-11-19 NOTE — Patient Instructions (Signed)

## 2013-11-19 NOTE — Progress Notes (Signed)
Pre visit review using our clinic review tool, if applicable. No additional management support is needed unless otherwise documented below in the visit note. 

## 2013-11-19 NOTE — Addendum Note (Signed)
Addended by: Wynonia Lawman E on: 11/19/2013 01:56 PM   Modules accepted: Orders

## 2013-12-14 DIAGNOSIS — D229 Melanocytic nevi, unspecified: Secondary | ICD-10-CM | POA: Diagnosis not present

## 2013-12-14 DIAGNOSIS — Z1283 Encounter for screening for malignant neoplasm of skin: Secondary | ICD-10-CM | POA: Diagnosis not present

## 2013-12-14 DIAGNOSIS — B079 Viral wart, unspecified: Secondary | ICD-10-CM | POA: Diagnosis not present

## 2013-12-14 DIAGNOSIS — L814 Other melanin hyperpigmentation: Secondary | ICD-10-CM | POA: Diagnosis not present

## 2013-12-14 DIAGNOSIS — Z85828 Personal history of other malignant neoplasm of skin: Secondary | ICD-10-CM | POA: Diagnosis not present

## 2013-12-14 DIAGNOSIS — L820 Inflamed seborrheic keratosis: Secondary | ICD-10-CM | POA: Diagnosis not present

## 2013-12-14 DIAGNOSIS — L578 Other skin changes due to chronic exposure to nonionizing radiation: Secondary | ICD-10-CM | POA: Diagnosis not present

## 2013-12-14 DIAGNOSIS — L821 Other seborrheic keratosis: Secondary | ICD-10-CM | POA: Diagnosis not present

## 2014-01-19 ENCOUNTER — Telehealth: Payer: Self-pay | Admitting: Internal Medicine

## 2014-01-19 NOTE — Telephone Encounter (Signed)
Printed, waiting for signature, then will fax

## 2014-01-19 NOTE — Telephone Encounter (Signed)
Order faxed to [facility]

## 2014-01-19 NOTE — Telephone Encounter (Signed)
Patient scheduled for Bone density at [facility] on 01/20/14 Katharine Look called and stated they need order for  Bone Density

## 2014-01-20 ENCOUNTER — Telehealth: Payer: Self-pay | Admitting: Internal Medicine

## 2014-01-20 ENCOUNTER — Ambulatory Visit: Payer: Self-pay | Admitting: Internal Medicine

## 2014-01-20 ENCOUNTER — Encounter: Payer: Self-pay | Admitting: *Deleted

## 2014-01-20 DIAGNOSIS — Z13820 Encounter for screening for osteoporosis: Secondary | ICD-10-CM | POA: Diagnosis not present

## 2014-01-20 DIAGNOSIS — M8580 Other specified disorders of bone density and structure, unspecified site: Secondary | ICD-10-CM | POA: Diagnosis not present

## 2014-01-20 DIAGNOSIS — M8589 Other specified disorders of bone density and structure, multiple sites: Secondary | ICD-10-CM | POA: Diagnosis not present

## 2014-01-20 LAB — HM DEXA SCAN

## 2014-01-20 NOTE — Telephone Encounter (Signed)
Sent mychart message with results

## 2014-01-20 NOTE — Telephone Encounter (Signed)
Bone density testing showed mild osteopenia, or early weakening of the bones. I would not recommend any change in medication at this point. We can review at her next follow up.

## 2014-02-09 ENCOUNTER — Encounter: Payer: Self-pay | Admitting: Internal Medicine

## 2014-04-29 ENCOUNTER — Telehealth: Payer: Self-pay | Admitting: Internal Medicine

## 2014-04-29 NOTE — Telephone Encounter (Signed)
Can we please move her to one of my open slots tomorrow?

## 2014-04-29 NOTE — Telephone Encounter (Signed)
FYI

## 2014-04-29 NOTE — Telephone Encounter (Signed)
Patient Name: Shelly Arias  DOB: 02/22/1946    Initial Comment Caller states she having stomach pain and feels very bloated, loss of appetite. Caller states when she goes to have a BM it hurts in her lower stomach.    Nurse Assessment  Nurse: Mallie Mussel, RN, Alveta Heimlich Date/Time Eilene Ghazi Time): 04/29/2014 12:43:46 PM  Confirm and document reason for call. If symptomatic, describe symptoms. ---Caller states that she has had lower abdominal pain beginning Saturday. She had some low grade fevers around 99. She feels bloated. She rates her pain as 6-7 on 0-10 scale when she has the pain. The pain is not constant. The pain usually lasts only about a minute at a time. Denies vomiting, diarrhea, and any fever today.  Has the patient traveled out of the country within the last 30 days? ---No  Does the patient require triage? ---Yes  Related visit to physician within the last 2 weeks? ---No  Does the PT have any chronic conditions? (i.e. diabetes, asthma, etc.) ---Yes  List chronic conditions. ---HTN, Hypercholesterolemia     Guidelines    Guideline Title Affirmed Question Affirmed Notes  Abdominal Pain - Female [1] MODERATE (e.g., interferes with normal activities) AND [2] pain comes and goes (cramps) AND [3] present > 24 hours (Exception: pain with Vomiting or Diarrhea - see that Guideline)    Final Disposition User   See Physician within [place], RN, Alveta Heimlich    Comments  Appointment made for 10:30am Friday 04/30/14 with Dr. Eliezer Lofts at [facility].

## 2014-04-29 NOTE — Telephone Encounter (Signed)
The patient has been notified.

## 2014-04-30 ENCOUNTER — Encounter: Payer: Self-pay | Admitting: Internal Medicine

## 2014-04-30 ENCOUNTER — Ambulatory Visit: Payer: Self-pay | Admitting: Family Medicine

## 2014-04-30 ENCOUNTER — Ambulatory Visit: Payer: Self-pay | Admitting: Internal Medicine

## 2014-04-30 ENCOUNTER — Ambulatory Visit (INDEPENDENT_AMBULATORY_CARE_PROVIDER_SITE_OTHER): Payer: Medicare Other | Admitting: Internal Medicine

## 2014-04-30 VITALS — BP 140/82 | HR 110 | Temp 98.0°F | Ht 61.5 in | Wt 170.2 lb

## 2014-04-30 DIAGNOSIS — K5732 Diverticulitis of large intestine without perforation or abscess without bleeding: Secondary | ICD-10-CM | POA: Insufficient documentation

## 2014-04-30 DIAGNOSIS — R935 Abnormal findings on diagnostic imaging of other abdominal regions, including retroperitoneum: Secondary | ICD-10-CM | POA: Diagnosis not present

## 2014-04-30 DIAGNOSIS — R1084 Generalized abdominal pain: Secondary | ICD-10-CM | POA: Diagnosis not present

## 2014-04-30 DIAGNOSIS — K8020 Calculus of gallbladder without cholecystitis without obstruction: Secondary | ICD-10-CM | POA: Diagnosis not present

## 2014-04-30 DIAGNOSIS — K6389 Other specified diseases of intestine: Secondary | ICD-10-CM | POA: Diagnosis not present

## 2014-04-30 DIAGNOSIS — K5730 Diverticulosis of large intestine without perforation or abscess without bleeding: Secondary | ICD-10-CM | POA: Diagnosis not present

## 2014-04-30 DIAGNOSIS — K5660 Unspecified intestinal obstruction: Secondary | ICD-10-CM | POA: Diagnosis not present

## 2014-04-30 LAB — CBC WITH DIFFERENTIAL/PLATELET
Basophils Absolute: 0 10*3/uL (ref 0.0–0.1)
Basophils Relative: 0 % (ref 0–1)
Eosinophils Absolute: 0.1 10*3/uL (ref 0.0–0.7)
Eosinophils Relative: 1 % (ref 0–5)
HCT: 39.2 % (ref 36.0–46.0)
HEMOGLOBIN: 13.5 g/dL (ref 12.0–15.0)
LYMPHS ABS: 1.5 10*3/uL (ref 0.7–4.0)
LYMPHS PCT: 18 % (ref 12–46)
MCH: 31.7 pg (ref 26.0–34.0)
MCHC: 34.4 g/dL (ref 30.0–36.0)
MCV: 92 fL (ref 78.0–100.0)
MONOS PCT: 7 % (ref 3–12)
MPV: 9.6 fL (ref 8.6–12.4)
Monocytes Absolute: 0.6 10*3/uL (ref 0.1–1.0)
Neutro Abs: 6.3 10*3/uL (ref 1.7–7.7)
Neutrophils Relative %: 74 % (ref 43–77)
Platelets: 283 10*3/uL (ref 150–400)
RBC: 4.26 MIL/uL (ref 3.87–5.11)
RDW: 13.6 % (ref 11.5–15.5)
WBC: 8.5 10*3/uL (ref 4.0–10.5)

## 2014-04-30 LAB — COMPREHENSIVE METABOLIC PANEL
ALBUMIN: 4.1 g/dL (ref 3.5–5.2)
ALT: 14 U/L (ref 0–35)
AST: 14 U/L (ref 0–37)
Alkaline Phosphatase: 80 U/L (ref 39–117)
BUN: 8 mg/dL (ref 6–23)
CHLORIDE: 96 meq/L (ref 96–112)
CO2: 29 mEq/L (ref 19–32)
Calcium: 9.1 mg/dL (ref 8.4–10.5)
Creat: 0.7 mg/dL (ref 0.50–1.10)
GLUCOSE: 118 mg/dL — AB (ref 70–99)
POTASSIUM: 4.3 meq/L (ref 3.5–5.3)
SODIUM: 133 meq/L — AB (ref 135–145)
TOTAL PROTEIN: 6.5 g/dL (ref 6.0–8.3)
Total Bilirubin: 0.4 mg/dL (ref 0.2–1.2)

## 2014-04-30 LAB — LIPASE: Lipase: 16 U/L (ref 0–75)

## 2014-04-30 MED ORDER — METRONIDAZOLE 500 MG PO TABS: 500.0000 mg | ORAL_TABLET | Freq: Three times a day (TID) | ORAL | Status: DC

## 2014-04-30 MED ORDER — SULFAMETHOXAZOLE-TRIMETHOPRIM 800-160 MG PO TABS: 1.0000 | ORAL_TABLET | Freq: Two times a day (BID) | ORAL | Status: DC

## 2014-04-30 NOTE — Addendum Note (Signed)
Addended by: Ronette Deter A on: 04/30/2014 06:23 PM   Modules accepted: Orders

## 2014-04-30 NOTE — Progress Notes (Signed)
   Subjective:    Patient ID: Wynn Maudlin, female    DOB: [birth year], 69 y.o.   MRN: [id number]  HPI  69YO female presents for acute visit.  Saturday, developed bloating and pain in lower abdomen bilaterally. No diarrhea or constipation. No NV. Monday had small amount of watery stool, but otherwise all stool has been normal. No dark or bloody stools.  Sunday had low grade temp 64F. Tolerating bland food. Appetite has been poor. Stopped drinking sodas. Wednesday, developed pain extending to right upper abdomen. Started Gax-x with some improvement. Some mild aching pain in right lower ribs and epigstric area. Not taking anything for pain.  Past medical, surgical, family and social history per today's encounter.  Review of Systems  Constitutional: Positive for fever and appetite change. Negative for chills, fatigue and unexpected weight change.  Eyes: Negative for visual disturbance.  Respiratory: Negative for shortness of breath.   Cardiovascular: Negative for chest pain and leg swelling.  Gastrointestinal: Positive for abdominal pain and abdominal distention. Negative for nausea, vomiting, diarrhea, constipation and blood in stool.  Skin: Negative for color change and rash.  Hematological: Negative for adenopathy. Does not bruise/bleed easily.  Psychiatric/Behavioral: Negative for dysphoric mood. The patient is not nervous/anxious.        Objective:    BP 140/82 mmHg  Pulse 110  Temp(Src) 98 F (36.7 C) (Oral)  Ht 5' 1.5" (1.562 m)  Wt 170 lb 4 oz (77.225 kg)  BMI 31.65 kg/m2  SpO2 98% Physical Exam  Constitutional: She is oriented to person, place, and time. She appears well-developed and well-nourished. No distress.  HENT:  Head: Normocephalic and atraumatic.  Right Ear: External ear normal.  Left Ear: External ear normal.  Nose: Nose normal.  Mouth/Throat: Oropharynx is clear and moist.  Eyes: Conjunctivae are normal. Pupils are equal, round, and reactive to light.  Right eye exhibits no discharge. Left eye exhibits no discharge. No scleral icterus.  Neck: Normal range of motion. Neck supple. No tracheal deviation present. No thyromegaly present.  Pulmonary/Chest: Effort normal. No accessory muscle usage. No tachypnea. She has no decreased breath sounds. She has no rhonchi.  Abdominal: Soft. Bowel sounds are normal. She exhibits distension (mild diffuse). She exhibits no mass. There is tenderness (left lower abdomen). There is guarding (left lower abdomen). There is no rebound.  Musculoskeletal: Normal range of motion. She exhibits no edema or tenderness.  Lymphadenopathy:    She has no cervical adenopathy.  Neurological: She is alert and oriented to person, place, and time. No cranial nerve deficit. She exhibits normal muscle tone. Coordination normal.  Skin: Skin is warm and dry. No rash noted. She is not diaphoretic. No erythema. No pallor.  Psychiatric: She has a normal mood and affect. Her behavior is normal. Judgment and thought content normal.          Assessment & Plan:   Problem List Items Addressed This Visit      Unprioritized   Abdominal pain - Primary    Diffuse abdominal pain. Exam remarkable for LLQ abdominal tenderness and guarding. Will set up CT abdomen to evaluate for diverticulitis. Will check CBC, CMP, lipase. Follow up after CT complete.      Relevant Orders   CBC w/Diff   Comprehensive metabolic panel   Lipase   CT Abdomen Pelvis W Contrast       Return in about 1 week (around 05/07/2014) for Recheck.

## 2014-04-30 NOTE — Patient Instructions (Signed)
Labs today.  CT abdomen today.

## 2014-04-30 NOTE — Assessment & Plan Note (Signed)
Diffuse abdominal pain. Exam remarkable for LLQ abdominal tenderness and guarding. Will set up CT abdomen to evaluate for diverticulitis. Will check CBC, CMP, lipase. Follow up after CT complete.

## 2014-04-30 NOTE — Progress Notes (Signed)
Pre visit review using our clinic review tool, if applicable. No additional management support is needed unless otherwise documented below in the visit note. 

## 2014-05-03 ENCOUNTER — Ambulatory Visit: Payer: Medicare Other | Admitting: Internal Medicine

## 2014-05-09 ENCOUNTER — Other Ambulatory Visit: Payer: Self-pay | Admitting: Internal Medicine

## 2014-05-17 ENCOUNTER — Encounter: Payer: Self-pay | Admitting: Internal Medicine

## 2014-05-20 ENCOUNTER — Ambulatory Visit: Payer: Medicare Other | Admitting: Internal Medicine

## 2014-05-24 DIAGNOSIS — J208 Acute bronchitis due to other specified organisms: Secondary | ICD-10-CM | POA: Diagnosis not present

## 2014-05-24 DIAGNOSIS — R0981 Nasal congestion: Secondary | ICD-10-CM | POA: Diagnosis not present

## 2014-06-15 DIAGNOSIS — H01003 Unspecified blepharitis right eye, unspecified eyelid: Secondary | ICD-10-CM | POA: Diagnosis not present

## 2014-07-01 ENCOUNTER — Encounter: Payer: Self-pay | Admitting: Internal Medicine

## 2014-07-01 ENCOUNTER — Ambulatory Visit (INDEPENDENT_AMBULATORY_CARE_PROVIDER_SITE_OTHER): Payer: Medicare Other | Admitting: Internal Medicine

## 2014-07-01 VITALS — BP 128/70 | HR 86 | Resp 14 | Ht 61.5 in | Wt 164.5 lb

## 2014-07-01 DIAGNOSIS — I10 Essential (primary) hypertension: Secondary | ICD-10-CM | POA: Diagnosis not present

## 2014-07-01 DIAGNOSIS — K5732 Diverticulitis of large intestine without perforation or abscess without bleeding: Secondary | ICD-10-CM | POA: Diagnosis not present

## 2014-07-01 NOTE — Assessment & Plan Note (Signed)
Symptoms have resolved after antibiotic treatment. Discussed dietary changes, avoiding smaller particles of food, increasing fiber. Follow up in 6 months and prn.

## 2014-07-01 NOTE — Assessment & Plan Note (Signed)
BP Readings from Last 3 Encounters:  07/01/14 128/70  04/30/14 140/82  11/19/13 120/70   BP well controlled. Continue Metoprolol.

## 2014-07-01 NOTE — Progress Notes (Signed)
Pre visit review using our clinic review tool, if applicable. No additional management support is needed unless otherwise documented below in the visit note. 

## 2014-07-01 NOTE — Patient Instructions (Signed)
Consider reading the book, "Always Hungry" Dr. Isabella Stalling

## 2014-07-01 NOTE — Progress Notes (Signed)
Subjective:    Patient ID: Shelly Arias, female    DOB: [birth year], 69 y.o.   MRN: [id number]  HPI  69YO female presents for follow up.  Last seen 04/2014 for abdominal pain. CT showed Diverticulitis. Treated with antibiotics. Symptoms have resolved. Questions what kind of diet would be best for her in terms of both diverticulitis and weight loss.  Otherwise, generally feeling well. Has tried to cut back on smoking.  HTN - Compliant with medication. No HA, CP, palpitations.  Wt Readings from Last 3 Encounters:  07/01/14 164 lb 8 oz (74.617 kg)  04/30/14 170 lb 4 oz (77.225 kg)  11/19/13 171 lb (77.565 kg)    Past medical, surgical, family and social history per today's encounter.  Review of Systems  Constitutional: Negative for fever, chills, appetite change, fatigue and unexpected weight change.  Eyes: Negative for visual disturbance.  Respiratory: Negative for shortness of breath.   Cardiovascular: Negative for chest pain and leg swelling.  Gastrointestinal: Negative for nausea, vomiting, abdominal pain, diarrhea, constipation, blood in stool and abdominal distention.  Musculoskeletal: Negative for myalgias and arthralgias.  Skin: Negative for color change and rash.  Hematological: Negative for adenopathy. Does not bruise/bleed easily.  Psychiatric/Behavioral: Negative for sleep disturbance and dysphoric mood. The patient is not nervous/anxious.        Objective:    BP 128/70 mmHg  Pulse 86  Resp 14  Ht 5' 1.5" (1.562 m)  Wt 164 lb 8 oz (74.617 kg)  BMI 30.58 kg/m2  SpO2 99% Physical Exam  Constitutional: She is oriented to person, place, and time. She appears well-developed and well-nourished. No distress.  HENT:  Head: Normocephalic and atraumatic.  Right Ear: External ear normal.  Left Ear: External ear normal.  Nose: Nose normal.  Mouth/Throat: Oropharynx is clear and moist. No oropharyngeal exudate.  Eyes: Conjunctivae are normal. Pupils are equal,  round, and reactive to light. Right eye exhibits no discharge. Left eye exhibits no discharge. No scleral icterus.  Neck: Normal range of motion. Neck supple. No tracheal deviation present. No thyromegaly present.  Cardiovascular: Normal rate, regular rhythm, normal heart sounds and intact distal pulses.  Exam reveals no gallop and no friction rub.   No murmur heard. Pulmonary/Chest: Effort normal and breath sounds normal. No respiratory distress. She has no wheezes. She has no rales. She exhibits no tenderness.  Musculoskeletal: Normal range of motion. She exhibits no edema or tenderness.  Lymphadenopathy:    She has no cervical adenopathy.  Neurological: She is alert and oriented to person, place, and time. No cranial nerve deficit. She exhibits normal muscle tone. Coordination normal.  Skin: Skin is warm and dry. No rash noted. She is not diaphoretic. No erythema. No pallor.  Psychiatric: She has a normal mood and affect. Her behavior is normal. Judgment and thought content normal.          Assessment & Plan:   Problem List Items Addressed This Visit      Unprioritized   Diverticulitis of colon    Symptoms have resolved after antibiotic treatment. Discussed dietary changes, avoiding smaller particles of food, increasing fiber. Follow up in 6 months and prn.      Essential hypertension - Primary    BP Readings from Last 3 Encounters:  07/01/14 128/70  04/30/14 140/82  11/19/13 120/70   BP well controlled. Continue Metoprolol.          Return in about 6 months (around 12/31/2014) for Wellness Visit.

## 2014-07-05 ENCOUNTER — Ambulatory Visit
Admit: 2014-07-05 | Disposition: A | Discharge status: Home / Self Care | Payer: Self-pay | Attending: Internal Medicine | Admitting: Internal Medicine

## 2014-07-09 ENCOUNTER — Ambulatory Visit
Admit: 2014-07-09 | Disposition: A | Discharge status: Home / Self Care | Payer: Self-pay | Attending: Internal Medicine | Admitting: Internal Medicine

## 2014-07-09 DIAGNOSIS — Z79899 Other long term (current) drug therapy: Secondary | ICD-10-CM | POA: Diagnosis not present

## 2014-07-09 DIAGNOSIS — Z9221 Personal history of antineoplastic chemotherapy: Secondary | ICD-10-CM | POA: Diagnosis not present

## 2014-07-09 DIAGNOSIS — I10 Essential (primary) hypertension: Secondary | ICD-10-CM | POA: Diagnosis not present

## 2014-07-09 DIAGNOSIS — Z90710 Acquired absence of both cervix and uterus: Secondary | ICD-10-CM | POA: Diagnosis not present

## 2014-07-09 DIAGNOSIS — Z853 Personal history of malignant neoplasm of breast: Secondary | ICD-10-CM | POA: Diagnosis not present

## 2014-07-09 DIAGNOSIS — F17210 Nicotine dependence, cigarettes, uncomplicated: Secondary | ICD-10-CM | POA: Diagnosis not present

## 2014-07-09 LAB — HEPATIC FUNCTION PANEL A (ARMC)
Albumin: 4.3 g/dL
Alkaline Phosphatase: 66 U/L
Bilirubin, Direct: 0.2 mg/dL
Bilirubin,Total: 0.6 mg/dL
Indirect Bilirubin: 0.4
SGOT(AST): 19 U/L
SGPT (ALT): 18 U/L
Total Protein: 7.3 g/dL

## 2014-07-09 LAB — CBC CANCER CENTER
BASOS ABS: 0 x10 3/mm (ref 0.0–0.1)
Basophil %: 0.5 %
Eosinophil #: 0.1 x10 3/mm (ref 0.0–0.7)
Eosinophil %: 1 %
HCT: 40.4 % (ref 35.0–47.0)
HGB: 13.3 g/dL (ref 12.0–16.0)
LYMPHS PCT: 26.5 %
Lymphocyte #: 1.4 x10 3/mm (ref 1.0–3.6)
MCH: 31.5 pg (ref 26.0–34.0)
MCHC: 32.9 g/dL (ref 32.0–36.0)
MCV: 96 fL (ref 80–100)
Monocyte #: 0.3 x10 3/mm (ref 0.2–0.9)
Monocyte %: 6.6 %
NEUTROS ABS: 3.4 x10 3/mm (ref 1.4–6.5)
Neutrophil %: 65.4 %
PLATELETS: 213 x10 3/mm (ref 150–440)
RBC: 4.22 10*6/uL (ref 3.80–5.20)
RDW: 15 % — AB (ref 11.5–14.5)
WBC: 5.2 x10 3/mm (ref 3.6–11.0)

## 2014-07-09 LAB — CREATININE, SERUM
CREATININE: 0.67 mg/dL
EGFR (Non-African Amer.): >60

## 2014-07-10 LAB — CANCER ANTIGEN 27.29: CA 27.29: 32.4 U/mL (ref 0.0–38.6)

## 2014-07-15 ENCOUNTER — Other Ambulatory Visit: Payer: Self-pay

## 2014-07-15 DIAGNOSIS — C50911 Malignant neoplasm of unspecified site of right female breast: Secondary | ICD-10-CM

## 2014-07-16 ENCOUNTER — Other Ambulatory Visit: Payer: Self-pay | Admitting: Internal Medicine

## 2014-07-16 DIAGNOSIS — Z1231 Encounter for screening mammogram for malignant neoplasm of breast: Secondary | ICD-10-CM

## 2014-10-18 ENCOUNTER — Ambulatory Visit
Admission: RE | Admit: 2014-10-18 | Discharge: 2014-10-18 | Disposition: A | Discharge status: Home / Self Care | Payer: Medicare Other | Source: Ambulatory Visit | Attending: Internal Medicine | Admitting: Internal Medicine

## 2014-10-18 ENCOUNTER — Other Ambulatory Visit: Payer: Self-pay | Admitting: Internal Medicine

## 2014-10-18 DIAGNOSIS — Z1231 Encounter for screening mammogram for malignant neoplasm of breast: Secondary | ICD-10-CM

## 2014-11-04 ENCOUNTER — Other Ambulatory Visit: Payer: Self-pay | Admitting: Internal Medicine

## 2014-11-07 ENCOUNTER — Other Ambulatory Visit: Payer: Self-pay | Admitting: Internal Medicine

## 2014-12-13 DIAGNOSIS — L578 Other skin changes due to chronic exposure to nonionizing radiation: Secondary | ICD-10-CM | POA: Diagnosis not present

## 2014-12-13 DIAGNOSIS — D229 Melanocytic nevi, unspecified: Secondary | ICD-10-CM | POA: Diagnosis not present

## 2014-12-13 DIAGNOSIS — D485 Neoplasm of uncertain behavior of skin: Secondary | ICD-10-CM | POA: Diagnosis not present

## 2014-12-13 DIAGNOSIS — T07 Unspecified multiple injuries: Secondary | ICD-10-CM | POA: Diagnosis not present

## 2014-12-13 DIAGNOSIS — Z1283 Encounter for screening for malignant neoplasm of skin: Secondary | ICD-10-CM | POA: Diagnosis not present

## 2014-12-13 DIAGNOSIS — D692 Other nonthrombocytopenic purpura: Secondary | ICD-10-CM | POA: Diagnosis not present

## 2014-12-13 DIAGNOSIS — B078 Other viral warts: Secondary | ICD-10-CM | POA: Diagnosis not present

## 2014-12-13 DIAGNOSIS — Z85828 Personal history of other malignant neoplasm of skin: Secondary | ICD-10-CM | POA: Diagnosis not present

## 2014-12-13 DIAGNOSIS — L821 Other seborrheic keratosis: Secondary | ICD-10-CM | POA: Diagnosis not present

## 2014-12-20 DIAGNOSIS — M1612 Unilateral primary osteoarthritis, left hip: Secondary | ICD-10-CM | POA: Diagnosis not present

## 2014-12-20 DIAGNOSIS — Z96641 Presence of right artificial hip joint: Secondary | ICD-10-CM | POA: Diagnosis not present

## 2014-12-26 DIAGNOSIS — H10021 Other mucopurulent conjunctivitis, right eye: Secondary | ICD-10-CM | POA: Diagnosis not present

## 2014-12-26 DIAGNOSIS — H109 Unspecified conjunctivitis: Secondary | ICD-10-CM | POA: Diagnosis not present

## 2014-12-31 ENCOUNTER — Ambulatory Visit: Payer: No Typology Code available for payment source

## 2015-01-14 ENCOUNTER — Ambulatory Visit: Payer: No Typology Code available for payment source | Admitting: Internal Medicine

## 2015-01-31 ENCOUNTER — Other Ambulatory Visit: Payer: Self-pay

## 2015-01-31 ENCOUNTER — Ambulatory Visit (INDEPENDENT_AMBULATORY_CARE_PROVIDER_SITE_OTHER): Payer: Medicare Other

## 2015-01-31 VITALS — BP 138/72 | HR 93 | Temp 98.0°F | Resp 14 | Ht 61.5 in | Wt 166.8 lb

## 2015-01-31 DIAGNOSIS — R7309 Other abnormal glucose: Secondary | ICD-10-CM | POA: Diagnosis not present

## 2015-01-31 DIAGNOSIS — Z1159 Encounter for screening for other viral diseases: Secondary | ICD-10-CM

## 2015-01-31 DIAGNOSIS — Z0000 Encounter for general adult medical examination without abnormal findings: Secondary | ICD-10-CM | POA: Diagnosis not present

## 2015-01-31 DIAGNOSIS — R739 Hyperglycemia, unspecified: Secondary | ICD-10-CM

## 2015-01-31 DIAGNOSIS — E785 Hyperlipidemia, unspecified: Secondary | ICD-10-CM

## 2015-01-31 LAB — MICROALBUMIN / CREATININE URINE RATIO
Creatinine,U: 64.7 mg/dL
MICROALB/CREAT RATIO: 1.1 mg/g (ref 0.0–30.0)
Microalb, Ur: <0.7 mg/dL (ref 0.0–1.9)

## 2015-01-31 LAB — HEMOGLOBIN A1C: HEMOGLOBIN A1C: 6.2 % (ref 4.6–6.5)

## 2015-01-31 NOTE — Patient Instructions (Addendum)
Shelly Arias,  Thank you for taking time to come for your Medicare Wellness Visit.  I appreciate your ongoing commitment to your health goals. Please review the following plan we discussed and let me know if I can assist you in the future.  Return in December for scheduled appointment  Return in 1 year for medicare wellness visit with Health Coach  Make eye exam  Health Maintenance, Female Adopting a healthy lifestyle and getting preventive care can go a long way to promote health and wellness. Talk with your health care provider about what schedule of regular examinations is right for you. This is a good chance for you to check in with your provider about disease prevention and staying healthy. In between checkups, there are plenty of things you can do on your own. Experts have done a lot of research about which lifestyle changes and preventive measures are most likely to keep you healthy. Ask your health care provider for more information. WEIGHT AND DIET  Eat a healthy diet  Be sure to include plenty of vegetables, fruits, low-fat dairy products, and lean protein.  Do not eat a lot of foods high in solid fats, added sugars, or salt.  Get regular exercise. This is one of the most important things you can do for your health.  Most adults should exercise for at least 150 minutes each week. The exercise should increase your heart rate and make you sweat (moderate-intensity exercise).  Most adults should also do strengthening exercises at least twice a week. This is in addition to the moderate-intensity exercise.  Maintain a healthy weight  Body mass index (BMI) is a measurement that can be used to identify possible weight problems. It estimates body fat based on height and weight. Your health care provider can help determine your BMI and help you achieve or maintain a healthy weight.  For females 23 years of age and older:   A BMI below 18.5 is considered underweight.  A BMI of 18.5  to 24.9 is normal.  A BMI of 25 to 29.9 is considered overweight.  A BMI of 30 and above is considered obese.  Watch levels of cholesterol and blood lipids  You should start having your blood tested for lipids and cholesterol at 67 years of age, then have this test every 5 years.  You may need to have your cholesterol levels checked more often if:  Your lipid or cholesterol levels are high.  You are older than 67 years of age.  You are at high risk for heart disease.  CANCER SCREENING   Lung Cancer  Lung cancer screening is recommended for adults 62-45 years old who are at high risk for lung cancer because of a history of smoking.  A yearly low-dose CT scan of the lungs is recommended for people who:  Currently smoke.  Have quit within the past 15 years.  Have at least a 30-pack-year history of smoking. A pack year is smoking an average of one pack of cigarettes a day for 1 year.  Yearly screening should continue until it has been 15 years since you quit.  Yearly screening should stop if you develop a health problem that would prevent you from having lung cancer treatment.  Breast Cancer  Practice breast self-awareness. This means understanding how your breasts normally appear and feel.  It also means doing regular breast self-exams. Let your health care provider know about any changes, no matter how small.  If you are in your 13s  or 53s, you should have a clinical breast exam (CBE) by a health care provider every 1-3 years as part of a regular health exam.  If you are 51 or older, have a CBE every year. Also consider having a breast X-ray (mammogram) every year.  If you have a family history of breast cancer, talk to your health care provider about genetic screening.  If you are at high risk for breast cancer, talk to your health care provider about having an MRI and a mammogram every year.  Breast cancer gene (BRCA) assessment is recommended for women who have  family members with BRCA-related cancers. BRCA-related cancers include:  Breast.  Ovarian.  Tubal.  Peritoneal cancers.  Results of the assessment will determine the need for genetic counseling and BRCA1 and BRCA2 testing. Cervical Cancer Your health care provider may recommend that you be screened regularly for cancer of the pelvic organs (ovaries, uterus, and vagina). This screening involves a pelvic examination, including checking for microscopic changes to the surface of your cervix (Pap test). You may be encouraged to have this screening done every 3 years, beginning at age 9.  For women ages 72-65, health care providers may recommend pelvic exams and Pap testing every 3 years, or they may recommend the Pap and pelvic exam, combined with testing for human papilloma virus (HPV), every 5 years. Some types of HPV increase your risk of cervical cancer. Testing for HPV may also be done on women of any age with unclear Pap test results.  Other health care providers may not recommend any screening for nonpregnant women who are considered low risk for pelvic cancer and who do not have symptoms. Ask your health care provider if a screening pelvic exam is right for you.  If you have had past treatment for cervical cancer or a condition that could lead to cancer, you need Pap tests and screening for cancer for at least 20 years after your treatment. If Pap tests have been discontinued, your risk factors (such as having a new sexual partner) need to be reassessed to determine if screening should resume. Some women have medical problems that increase the chance of getting cervical cancer. In these cases, your health care provider may recommend more frequent screening and Pap tests. Colorectal Cancer  This type of cancer can be detected and often prevented.  Routine colorectal cancer screening usually begins at 67 years of age and continues through 67 years of age.  Your health care provider may  recommend screening at an earlier age if you have risk factors for colon cancer.  Your health care provider may also recommend using home test kits to check for hidden blood in the stool.  A small camera at the end of a tube can be used to examine your colon directly (sigmoidoscopy or colonoscopy). This is done to check for the earliest forms of colorectal cancer.  Routine screening usually begins at age 95.  Direct examination of the colon should be repeated every 5-10 years through 67 years of age. However, you may need to be screened more often if early forms of precancerous polyps or small growths are found. Skin Cancer  Check your skin from head to toe regularly.  Tell your health care provider about any new moles or changes in moles, especially if there is a change in a mole's shape or color.  Also tell your health care provider if you have a mole that is larger than the size of a pencil eraser.  Always use sunscreen. Apply sunscreen liberally and repeatedly throughout the day.  Protect yourself by wearing long sleeves, pants, a wide-brimmed hat, and sunglasses whenever you are outside. HEART DISEASE, DIABETES, AND HIGH BLOOD PRESSURE   High blood pressure causes heart disease and increases the risk of stroke. High blood pressure is more likely to develop in:  People who have blood pressure in the high end of the normal range (130-139/85-89 mm Hg).  People who are overweight or obese.  People who are African American.  If you are 70-51 years of age, have your blood pressure checked every 3-5 years. If you are 69 years of age or older, have your blood pressure checked every year. You should have your blood pressure measured twice--once when you are at a hospital or clinic, and once when you are not at a hospital or clinic. Record the average of the two measurements. To check your blood pressure when you are not at a hospital or clinic, you can use:  An automated blood pressure  machine at a pharmacy.  A home blood pressure monitor.  If you are between 42 years and 41 years old, ask your health care provider if you should take aspirin to prevent strokes.  Have regular diabetes screenings. This involves taking a blood sample to check your fasting blood sugar level.  If you are at a normal weight and have a low risk for diabetes, have this test once every three years after 67 years of age.  If you are overweight and have a high risk for diabetes, consider being tested at a younger age or more often. PREVENTING INFECTION  Hepatitis B  If you have a higher risk for hepatitis B, you should be screened for this virus. You are considered at high risk for hepatitis B if:  You were born in a country where hepatitis B is common. Ask your health care provider which countries are considered high risk.  Your parents were born in a high-risk country, and you have not been immunized against hepatitis B (hepatitis B vaccine).  You have HIV or AIDS.  You use needles to inject street drugs.  You live with someone who has hepatitis B.  You have had sex with someone who has hepatitis B.  You get hemodialysis treatment.  You take certain medicines for conditions, including cancer, organ transplantation, and autoimmune conditions. Hepatitis C  Blood testing is recommended for:  Everyone born from [birth year] through 1963.  Anyone with known risk factors for hepatitis C. Sexually transmitted infections (STIs)  You should be screened for sexually transmitted infections (STIs) including gonorrhea and chlamydia if:  You are sexually active and are younger than 67 years of age.  You are older than 67 years of age and your health care provider tells you that you are at risk for this type of infection.  Your sexual activity has changed since you were last screened and you are at an increased risk for chlamydia or gonorrhea. Ask your health care provider if you are at risk.  If  you do not have HIV, but are at risk, it may be recommended that you take a prescription medicine daily to prevent HIV infection. This is called pre-exposure prophylaxis (PrEP). You are considered at risk if:  You are sexually active and do not regularly use condoms or know the HIV status of your partner(s).  You take drugs by injection.  You are sexually active with a partner who has HIV. Talk with your health  care provider about whether you are at high risk of being infected with HIV. If you choose to begin PrEP, you should first be tested for HIV. You should then be tested every 3 months for as long as you are taking PrEP.  PREGNANCY   If you are premenopausal and you may become pregnant, ask your health care provider about preconception counseling.  If you may become pregnant, take 400 to 800 micrograms (mcg) of folic acid every day.  If you want to prevent pregnancy, talk to your health care provider about birth control (contraception). OSTEOPOROSIS AND MENOPAUSE   Osteoporosis is a disease in which the bones lose minerals and strength with aging. This can result in serious bone fractures. Your risk for osteoporosis can be identified using a bone density scan.  If you are 84 years of age or older, or if you are at risk for osteoporosis and fractures, ask your health care provider if you should be screened.  Ask your health care provider whether you should take a calcium or vitamin D supplement to lower your risk for osteoporosis.  Menopause may have certain physical symptoms and risks.  Hormone replacement therapy may reduce some of these symptoms and risks. Talk to your health care provider about whether hormone replacement therapy is right for you.  HOME CARE INSTRUCTIONS   Schedule regular health, dental, and eye exams.  Stay current with your immunizations.   Do not use any tobacco products including cigarettes, chewing tobacco, or electronic cigarettes.  If you are  pregnant, do not drink alcohol.  If you are breastfeeding, limit how much and how often you drink alcohol.  Limit alcohol intake to no more than 1 drink per day for nonpregnant women. One drink equals 12 ounces of beer, 5 ounces of wine, or 1 ounces of hard liquor.  Do not use street drugs.  Do not share needles.  Ask your health care provider for help if you need support or information about quitting drugs.  Tell your health care provider if you often feel depressed.  Tell your health care provider if you have ever been abused or do not feel safe at home.   This information is not intended to replace advice given to you by your health care provider. Make sure you discuss any questions you have with your health care provider.   Document Released: 09/11/2010 Document Revised: 03/19/2014 Document Reviewed: 01/28/2013 Elsevier Interactive Patient Education [employer].

## 2015-01-31 NOTE — Progress Notes (Signed)
Subjective:   Shelly Arias is a 69 y.o. female who presents for Medicare Annual (Subsequent) preventive examination.  Review of Systems:  No ROS.  Medicare Wellness Visit.  Cardiac Risk Factors include: advanced age (>42mn, >>85women);diabetes mellitus;hypertension;smoking/ tobacco exposure     Objective:     Vitals: BP 138/72 mmHg  Pulse 93  Temp(Src) 98 F (36.7 C) (Oral)  Resp 14  Ht 5' 1.5" (1.562 m)  Wt 166 lb 12.8 oz (75.66 kg)  BMI 31.01 kg/m2  SpO2 96%  Tobacco History  Smoking status  . Current Every Day Smoker -- 0.10 packs/day for 30 years  . Types: Cigarettes  Smokeless tobacco  . Not on file     Ready to quit: No Counseling given: Not Answered   Past Medical History  Diagnosis Date  . Hyperlipidemia   . Osteoporosis   . Osteoarthritis     right hip  . Hypertension   . Squamous cell carcinoma (HCC)     leg, Followed by Dr. SNicole Kindred . Breast cancer ([facility]     right, lumpectomy, radiation, chemo  . Breast cancer ([facility]     Right, 2007   Past Surgical History  Procedure Laterality Date  . Total hip arthroplasty      right  . Bunion repair    . Abdominal hysterectomy    . Shoulder surgery    . Left breast biopsy    . Nasal sinus surgery    . Right hip replacement    . Squamous cell carcinoma excision      right leg, Dr. SNicole Kindred . Breast biopsy Left 2002    left breast, calcifications  . Breast excisional biopsy Right 2007    positive   Family History  Problem Relation Age of Onset  . Heart disease Father 561 . Heart disease Brother   . Diabetes Maternal Grandmother   . Heart disease Mother   . Hyperlipidemia Mother   . Breast cancer Cousin    History  Sexual Activity  . Sexual Activity: No    Outpatient Encounter Prescriptions as of 01/31/2015  Medication Sig  . cetirizine (ZYRTEC) 10 MG tablet Take 10 mg by mouth daily.  . Cholecalciferol (VITAMIN D-3 PO) Take by mouth daily.  .Marland KitchenFexofenadine HCl (ALLEGRA PO) Take 1  capsule by mouth.  . metoprolol succinate (TOPROL-XL) 25 MG 24 hr tablet TAKE 1 TABLET BY MOUTH EVERY DAY  . Misc Natural Products (COSAMIN ASU ADVANCED FORMULA PO) Take by mouth daily.  . pravastatin (PRAVACHOL) 40 MG tablet TAKE 1 TABLET BY MOUTH EVERY DAY  . Probiotic Product (PROBIOTIC & ACIDOPHILUS EX ST PO) Take by mouth daily.   No facility-administered encounter medications on file as of 01/31/2015.    Activities of Daily Living In your present state of health, do you have any difficulty performing the following activities: 01/31/2015  Hearing? N  Vision? N  Difficulty concentrating or making decisions? N  Walking or climbing stairs? N  Dressing or bathing? N  Doing errands, shopping? N  Preparing Food and eating ? N  Using the Toilet? N  In the past six months, have you accidently leaked urine? N  Do you have problems with loss of bowel control? N  Managing your Medications? N  Managing your Finances? N  Housekeeping or managing your Housekeeping? N    Patient Care Team: JJackolyn Confer MD as PCP - General (Internal Medicine)    Assessment:    This is  a routine wellness examination for Shelly Arias. The goal of the wellness visit is to assist the patient how to close the gaps in care and create a preventative care plan for the patient.   Calcium and Vit D as appropriate/ Osteoporosis risk reviewed.  Medications reviewed; taking without issues or barriers.  Labs completed today: Hep C screening, Urine Microalbumin, A1C.  Foot exam postponed, per patient request.  ZOSTAVAX postponed, per patient request.  Safety issues reviewed; smoke detectors in the home. Firearms locked in a secure area in the home. Wears seatbelts when driving or riding with others. No violence in the home.  No identified risk were noted; The patient was oriented x 3; appropriate in dress and manner and no objective failures at ADL's or IADL's.   Patient Concerns: Lipid panel request.   Patient to return in December for scheduled surgery clearance.  Future labs placed.  Exercise Activities and Dietary recommendations Current Exercise Habits:: Home exercise routine, Type of exercise: walking, Time (Minutes): 30, Frequency (Times/Week): 5, Weekly Exercise (Minutes/Week): 150, Intensity: Mild  Goals    . Increase water intake     Currently drinks no water.  Patient centered goal is to drink 2 bottles =4 cups of water daily.      Fall Risk Fall Risk  01/31/2015 11/19/2013 11/06/2012  Falls in the past year? No No No   Depression Screen PHQ 2/9 Scores 01/31/2015 11/19/2013 11/06/2012  PHQ - 2 Score 0 0 0     Cognitive Testing MMSE - Mini Mental State Exam 01/31/2015  Orientation to time 5  Orientation to Place 5  Registration 3  Attention/ Calculation 5  Recall 3  Language- name 2 objects 2  Language- repeat 1  Language- follow 3 step command 3  Language- read & follow direction 1  Write a sentence 1  Copy design 1  Total score 30    Immunization History  Administered Date(s) Administered  . Influenza Split 12/12/2010, 01/01/2012  . Influenza Whole 01/14/2007, 12/07/2008  . Influenza,inj,Quad PF,36+ Mos 12/25/2012, 11/19/2013  . Influenza-Unspecified 01/07/2015  . Pneumococcal Conjugate-13 11/19/2013  . Pneumococcal Polysaccharide-23 11/06/2012  . Td 08/24/2002  . Tdap 11/06/2012   Screening Tests Health Maintenance  Topic Date Due  . FOOT EXAM  09/14/1955  . OPHTHALMOLOGY EXAM  09/14/1955  . ZOSTAVAX  01/11/2016 (Originally 09/15/2007)  . HEMOGLOBIN A1C  07/31/2015  . INFLUENZA VACCINE  10/11/2015  . URINE MICROALBUMIN  01/31/2016  . MAMMOGRAM  10/17/2016  . COLONOSCOPY  02/12/2018  . TETANUS/TDAP  11/07/2022  . DEXA SCAN  Completed  . Hepatitis C Screening  Completed  . PNA vac Low Risk Adult  Completed      Plan:   End of life planning; Advance aging; Advanced directives discussed. Living Will complete. Copy requested.  Make eye  exam.  Return in December for scheduled appointment.  During the course of the visit the patient was educated and counseled about the following appropriate screening and preventive services:   Vaccines to include Pneumoccal, Influenza, Hepatitis B, Td, Zostavax, HCV  Electrocardiogram  Cardiovascular Disease  Colorectal cancer screening  Bone density screening  Diabetes screening  Glaucoma screening  Mammography/PAP  Nutrition counseling   Patient Instructions (the written plan) was given to the patient.   Varney Biles, LPN  16/12/9602

## 2015-02-01 ENCOUNTER — Other Ambulatory Visit: Payer: Self-pay

## 2015-02-01 DIAGNOSIS — E785 Hyperlipidemia, unspecified: Secondary | ICD-10-CM

## 2015-02-01 LAB — HEPATITIS C ANTIBODY: HCV Ab: NEGATIVE

## 2015-02-01 NOTE — Progress Notes (Signed)
Annual Wellness Visit as completed by Health Coach was reviewed in full.  

## 2015-02-14 ENCOUNTER — Ambulatory Visit (INDEPENDENT_AMBULATORY_CARE_PROVIDER_SITE_OTHER): Payer: Medicare Other | Admitting: Internal Medicine

## 2015-02-14 ENCOUNTER — Encounter: Payer: Self-pay | Admitting: Internal Medicine

## 2015-02-14 VITALS — BP 130/61 | HR 87 | Temp 97.7°F | Ht 61.5 in | Wt 165.4 lb

## 2015-02-14 DIAGNOSIS — R9431 Abnormal electrocardiogram [ECG] [EKG]: Secondary | ICD-10-CM | POA: Diagnosis not present

## 2015-02-14 DIAGNOSIS — Z01818 Encounter for other preprocedural examination: Secondary | ICD-10-CM | POA: Diagnosis not present

## 2015-02-14 LAB — LIPID PANEL
CHOL/HDL RATIO: 3
CHOLESTEROL: 166 mg/dL (ref 0–200)
HDL: 65.6 mg/dL (ref >39.00)
LDL CALC: 86 mg/dL (ref 0–99)
NonHDL: 100.53
TRIGLYCERIDES: 73 mg/dL (ref 0.0–149.0)
VLDL: 14.6 mg/dL (ref 0.0–40.0)

## 2015-02-14 LAB — CBC WITH DIFFERENTIAL/PLATELET
Basophils Absolute: 0 10*3/uL (ref 0.0–0.1)
Basophils Relative: 0.3 % (ref 0.0–3.0)
EOS ABS: 0.1 10*3/uL (ref 0.0–0.7)
Eosinophils Relative: 1.2 % (ref 0.0–5.0)
HCT: 39.6 % (ref 36.0–46.0)
Hemoglobin: 13.3 g/dL (ref 12.0–15.0)
LYMPHS ABS: 1.3 10*3/uL (ref 0.7–4.0)
Lymphocytes Relative: 24.4 % (ref 12.0–46.0)
MCHC: 33.6 g/dL (ref 30.0–36.0)
MCV: 99 fl (ref 78.0–100.0)
MONOS PCT: 7.9 % (ref 3.0–12.0)
Monocytes Absolute: 0.4 10*3/uL (ref 0.1–1.0)
NEUTROS ABS: 3.6 10*3/uL (ref 1.4–7.7)
Neutrophils Relative %: 66.2 % (ref 43.0–77.0)
Platelets: 179 10*3/uL (ref 150.0–400.0)
RBC: 4.01 Mil/uL (ref 3.87–5.11)
RDW: 13.7 % (ref 11.5–15.5)
WBC: 5.4 10*3/uL (ref 4.0–10.5)

## 2015-02-14 LAB — COMPREHENSIVE METABOLIC PANEL
ALBUMIN: 4.2 g/dL (ref 3.5–5.2)
ALT: 12 U/L (ref 0–35)
AST: 16 U/L (ref 0–37)
Alkaline Phosphatase: 63 U/L (ref 39–117)
BILIRUBIN TOTAL: 0.6 mg/dL (ref 0.2–1.2)
BUN: 11 mg/dL (ref 6–23)
CALCIUM: 9.2 mg/dL (ref 8.4–10.5)
CO2: 29 mEq/L (ref 19–32)
Chloride: 101 mEq/L (ref 96–112)
Creatinine, Ser: 0.72 mg/dL (ref 0.40–1.20)
GFR: 85.77 mL/min (ref >60.00)
Glucose, Bld: 102 mg/dL — ABNORMAL HIGH (ref 70–99)
Potassium: 4.3 mEq/L (ref 3.5–5.1)
SODIUM: 137 meq/L (ref 135–145)
Total Protein: 6.9 g/dL (ref 6.0–8.3)

## 2015-02-14 LAB — PROTIME-INR
INR: 1.1 ratio — AB (ref 0.8–1.0)
PROTHROMBIN TIME: 11.2 s (ref 9.6–13.1)

## 2015-02-14 NOTE — Progress Notes (Signed)
Subjective:    Patient ID: Shelly Arias, female    DOB: [birth year], 69 y.o.   MRN: [id number]  HPI  69YO female presents for pre-op exam.  Scheduled for left hip replacement with Dr. Damita Lack in [facility] 03/16/2014. He also completed right hip replacement in 2011.  Had allergy in past to Morphine. Had respiratory arrest with Morphine during hysterectomy. Resolved with Narcan and IVF. Also has vomiting with Oxycodone.  No previous history of excessive bleeding. Did have a PRBC transfusion with previous hip replacement. No recent illnesses. No fever, chills. No new cough, dyspnea. Has some chronic cough with smoking. No chest pain, palpitations.    Wt Readings from Last 3 Encounters:  02/14/15 165 lb 6 oz (75.014 kg)  01/31/15 166 lb 12.8 oz (75.66 kg)  07/01/14 164 lb 8 oz (74.617 kg)   BP Readings from Last 3 Encounters:  02/14/15 130/61  01/31/15 138/72  07/01/14 128/70    Past Medical History  Diagnosis Date  . Hyperlipidemia   . Osteoporosis   . Osteoarthritis     right hip  . Hypertension   . Squamous cell carcinoma (HCC)     leg, Followed by Dr. Nicole Kindred  . Breast cancer ([place])     right, lumpectomy, radiation, chemo  . Breast cancer ([place])     Right, 2007   Family History  Problem Relation Age of Onset  . Heart disease Father 52  . Heart disease Brother   . Diabetes Maternal Grandmother   . Heart disease Mother   . Hyperlipidemia Mother   . Breast cancer Cousin    Past Surgical History  Procedure Laterality Date  . Total hip arthroplasty      right  . Bunion repair    . Abdominal hysterectomy    . Shoulder surgery    . Left breast biopsy    . Nasal sinus surgery    . Right hip replacement    . Squamous cell carcinoma excision      right leg, Dr. Nicole Kindred  . Breast biopsy Left 2002    left breast, calcifications  . Breast excisional biopsy Right 2007    positive   Social History   Social History  . Marital Status: Married    Spouse Name:  N/A  . Number of Children: N/A  . Years of Education: N/A   Social History Main Topics  . Smoking status: Current Every Day Smoker -- 0.10 packs/day for 30 years    Types: Cigarettes  . Smokeless tobacco: None  . Alcohol Use: Yes     Comment: socially  . Drug Use: No  . Sexual Activity: No   Other Topics Concern  . None   Social History Narrative    Review of Systems  Constitutional: Negative for fever, chills, appetite change, fatigue and unexpected weight change.  Eyes: Negative for visual disturbance.  Respiratory: Positive for cough. Negative for shortness of breath and wheezing.   Cardiovascular: Negative for chest pain, palpitations and leg swelling.  Gastrointestinal: Negative for nausea, vomiting, abdominal pain, diarrhea and constipation.  Musculoskeletal: Negative for myalgias and arthralgias.  Skin: Negative for color change and rash.  Hematological: Negative for adenopathy. Does not bruise/bleed easily.  Psychiatric/Behavioral: Negative for sleep disturbance and dysphoric mood. The patient is not nervous/anxious.        Objective:    BP 130/61 mmHg  Pulse 87  Temp(Src) 97.7 F (36.5 C) (Oral)  Ht 5' 1.5" (1.562 m)  Wt 165 lb 6  oz (75.014 kg)  BMI 30.75 kg/m2  SpO2 98% Physical Exam  Constitutional: She is oriented to person, place, and time. She appears well-developed and well-nourished. No distress.  HENT:  Head: Normocephalic and atraumatic.  Right Ear: External ear normal.  Left Ear: External ear normal.  Nose: Nose normal.  Mouth/Throat: Oropharynx is clear and moist. No oropharyngeal exudate.  Eyes: Conjunctivae are normal. Pupils are equal, round, and reactive to light. Right eye exhibits no discharge. Left eye exhibits no discharge. No scleral icterus.  Neck: Normal range of motion. Neck supple. No tracheal deviation present. No thyromegaly present.  Cardiovascular: Normal rate, regular rhythm, normal heart sounds and intact distal pulses.  Exam  reveals no gallop and no friction rub.   No murmur heard. Pulmonary/Chest: Effort normal and breath sounds normal. No accessory muscle usage. No respiratory distress. She has no decreased breath sounds. She has no wheezes. She has no rhonchi. She has no rales. She exhibits no tenderness.  Musculoskeletal: Normal range of motion. She exhibits no edema or tenderness.  Lymphadenopathy:    She has no cervical adenopathy.  Neurological: She is alert and oriented to person, place, and time. No cranial nerve deficit. She exhibits normal muscle tone. Coordination normal.  Skin: Skin is warm and dry. No rash noted. She is not diaphoretic. No erythema. No pallor.  Psychiatric: She has a normal mood and affect. Her behavior is normal. Judgment and thought content normal.          Assessment & Plan:   Problem List Items Addressed This Visit      Unprioritized   Nonspecific abnormal electrocardiogram (ECG) (EKG)    Abnormal EKG with poor R-wave progression. Pt has risks for CAD. Will set up cardiology evaluation.      Preop examination - Primary    Exam today normal. Peri-operative risks of cardiac events would be low, however given nonspecific changes on EKG, recommend cardiology clearance prior to surgery. Discussed risks of use of narcotics given her history of respiratory arrest with morphine and nausea with Oxycodone. She will discuss with anesthesiologist.   Labs ordered. Follow up after surgery complete.      Relevant Orders   CBC with Differential/Platelet   Comprehensive metabolic panel   Lipid panel   Protime-INR   EKG 12-Lead (Completed)       Return in about 6 weeks (around 03/28/2015) for Recheck.

## 2015-02-14 NOTE — Progress Notes (Signed)
Pre visit review using our clinic review tool, if applicable. No additional management support is needed unless otherwise documented below in the visit note. 

## 2015-02-14 NOTE — Assessment & Plan Note (Addendum)
Exam today normal. Peri-operative risks of cardiac events would be low, however given nonspecific changes on EKG, recommend cardiology clearance prior to surgery. Discussed risks of use of narcotics given her history of respiratory arrest with morphine and nausea with Oxycodone. She will discuss with anesthesiologist.   Labs ordered. Follow up after surgery complete.

## 2015-02-14 NOTE — Patient Instructions (Signed)
Labs today.  Follow up after surgery.

## 2015-02-14 NOTE — Assessment & Plan Note (Signed)
Abnormal EKG with poor R-wave progression. Pt has risks for CAD. Will set up cardiology evaluation.

## 2015-02-21 ENCOUNTER — Other Ambulatory Visit: Payer: Self-pay | Admitting: Internal Medicine

## 2015-02-21 ENCOUNTER — Telehealth: Payer: Self-pay | Admitting: Internal Medicine

## 2015-02-21 DIAGNOSIS — R9431 Abnormal electrocardiogram [ECG] [EKG]: Secondary | ICD-10-CM

## 2015-02-21 NOTE — Telephone Encounter (Signed)
Pt called about Dr Gilford Rile stating she was supposed to get a cardiac evaluation? Thank You!

## 2015-02-21 NOTE — Telephone Encounter (Signed)
OK. I have placed this order

## 2015-02-21 NOTE — Telephone Encounter (Signed)
I believe she is referring to a Cardiology Referral.  No order seen in chart

## 2015-02-23 ENCOUNTER — Telehealth: Payer: Self-pay | Admitting: *Deleted

## 2015-02-23 NOTE — Telephone Encounter (Signed)
Abnormal EKG findings most likely due to lead placement.  I only see one EKG performed.  Pt added to wait list in the event of a cancellation.

## 2015-02-23 NOTE — Telephone Encounter (Signed)
New Pt calling stating that she is being referred to [place] for she did an EKG for surgery that is coming up and PCP (in epic) saw a little difference She was then told to make an appointment with [place] but out next soonest in Feb  Would like to know if she needs an appointment or if we can make it sooner.  Please advise  Listed below is more information on the surgery.  Request for surgical clearance: 1. What type of surgery is being performed? Left hip replacement  2.  3. When is this surgery scheduled? Jan 5   4. Are there any medications that need to be held prior to surgery and how long? Not sure, but she has a list of things to do before it.   5. Name of physician performing surgery? Dr Ginger Carne at [facility] in [place]   6. What is your office phone and fax number? [phone] 7.

## 2015-02-25 ENCOUNTER — Telehealth: Payer: Self-pay | Admitting: *Deleted

## 2015-02-25 ENCOUNTER — Telehealth: Payer: Self-pay | Admitting: Internal Medicine

## 2015-02-25 NOTE — Telephone Encounter (Signed)
Will be faxing notes and confirmation of the okay to go ahead with surgery.

## 2015-02-25 NOTE — Telephone Encounter (Signed)
Follow up.

## 2015-02-25 NOTE — Telephone Encounter (Signed)
Patient also requested that the EKG be faxed over to the orthopedic surgeon [phone] Cardiology fax:[phone].  Patient requested a phone call from Dr. Gilford Rile.

## 2015-02-25 NOTE — Telephone Encounter (Signed)
I have faxed the ECG to both offices, please advise about the call.  thanks

## 2015-02-25 NOTE — Telephone Encounter (Signed)
Patient requested her EKG results be faxed over to her referral cardiology.

## 2015-02-25 NOTE — Telephone Encounter (Signed)
I spoke with Dr. Fletcher Anon. Given that changes in EKG are non-specific, he did not feel that this warranted a delay in surgery. Recommend to proceed with surgery. Recommend that pt have follow up with cardiology early next year.

## 2015-02-25 NOTE — Telephone Encounter (Signed)
I have sent a message to Dr. Fletcher Anon about her EKG. I will let you know when I hear.

## 2015-03-09 ENCOUNTER — Encounter: Payer: Self-pay | Admitting: *Deleted

## 2015-03-17 DIAGNOSIS — Z471 Aftercare following joint replacement surgery: Secondary | ICD-10-CM | POA: Diagnosis not present

## 2015-03-17 DIAGNOSIS — Z96642 Presence of left artificial hip joint: Secondary | ICD-10-CM | POA: Diagnosis not present

## 2015-03-17 DIAGNOSIS — M1612 Unilateral primary osteoarthritis, left hip: Secondary | ICD-10-CM | POA: Diagnosis not present

## 2015-03-17 DIAGNOSIS — F17210 Nicotine dependence, cigarettes, uncomplicated: Secondary | ICD-10-CM | POA: Diagnosis not present

## 2015-03-21 DIAGNOSIS — M25652 Stiffness of left hip, not elsewhere classified: Secondary | ICD-10-CM | POA: Diagnosis not present

## 2015-03-21 DIAGNOSIS — R262 Difficulty in walking, not elsewhere classified: Secondary | ICD-10-CM | POA: Diagnosis not present

## 2015-03-21 DIAGNOSIS — R269 Unspecified abnormalities of gait and mobility: Secondary | ICD-10-CM | POA: Diagnosis not present

## 2015-03-21 DIAGNOSIS — M25552 Pain in left hip: Secondary | ICD-10-CM | POA: Diagnosis not present

## 2015-03-21 DIAGNOSIS — M62562 Muscle wasting and atrophy, not elsewhere classified, left lower leg: Secondary | ICD-10-CM | POA: Diagnosis not present

## 2015-03-21 DIAGNOSIS — Z96642 Presence of left artificial hip joint: Secondary | ICD-10-CM | POA: Diagnosis not present

## 2015-03-23 DIAGNOSIS — M62562 Muscle wasting and atrophy, not elsewhere classified, left lower leg: Secondary | ICD-10-CM | POA: Diagnosis not present

## 2015-03-23 DIAGNOSIS — R269 Unspecified abnormalities of gait and mobility: Secondary | ICD-10-CM | POA: Diagnosis not present

## 2015-03-23 DIAGNOSIS — M25552 Pain in left hip: Secondary | ICD-10-CM | POA: Diagnosis not present

## 2015-03-23 DIAGNOSIS — Z96642 Presence of left artificial hip joint: Secondary | ICD-10-CM | POA: Diagnosis not present

## 2015-03-23 DIAGNOSIS — M25652 Stiffness of left hip, not elsewhere classified: Secondary | ICD-10-CM | POA: Diagnosis not present

## 2015-03-23 DIAGNOSIS — R262 Difficulty in walking, not elsewhere classified: Secondary | ICD-10-CM | POA: Diagnosis not present

## 2015-03-25 DIAGNOSIS — M62562 Muscle wasting and atrophy, not elsewhere classified, left lower leg: Secondary | ICD-10-CM | POA: Diagnosis not present

## 2015-03-25 DIAGNOSIS — M25552 Pain in left hip: Secondary | ICD-10-CM | POA: Diagnosis not present

## 2015-03-25 DIAGNOSIS — Z96642 Presence of left artificial hip joint: Secondary | ICD-10-CM | POA: Diagnosis not present

## 2015-03-25 DIAGNOSIS — M25652 Stiffness of left hip, not elsewhere classified: Secondary | ICD-10-CM | POA: Diagnosis not present

## 2015-03-25 DIAGNOSIS — R262 Difficulty in walking, not elsewhere classified: Secondary | ICD-10-CM | POA: Diagnosis not present

## 2015-03-25 DIAGNOSIS — R269 Unspecified abnormalities of gait and mobility: Secondary | ICD-10-CM | POA: Diagnosis not present

## 2015-03-28 DIAGNOSIS — M62562 Muscle wasting and atrophy, not elsewhere classified, left lower leg: Secondary | ICD-10-CM | POA: Diagnosis not present

## 2015-03-28 DIAGNOSIS — M25652 Stiffness of left hip, not elsewhere classified: Secondary | ICD-10-CM | POA: Diagnosis not present

## 2015-03-28 DIAGNOSIS — Z96642 Presence of left artificial hip joint: Secondary | ICD-10-CM | POA: Diagnosis not present

## 2015-03-28 DIAGNOSIS — R262 Difficulty in walking, not elsewhere classified: Secondary | ICD-10-CM | POA: Diagnosis not present

## 2015-03-28 DIAGNOSIS — M25552 Pain in left hip: Secondary | ICD-10-CM | POA: Diagnosis not present

## 2015-03-28 DIAGNOSIS — R269 Unspecified abnormalities of gait and mobility: Secondary | ICD-10-CM | POA: Diagnosis not present

## 2015-03-30 DIAGNOSIS — Z96642 Presence of left artificial hip joint: Secondary | ICD-10-CM | POA: Diagnosis not present

## 2015-03-30 DIAGNOSIS — M62562 Muscle wasting and atrophy, not elsewhere classified, left lower leg: Secondary | ICD-10-CM | POA: Diagnosis not present

## 2015-03-30 DIAGNOSIS — M25652 Stiffness of left hip, not elsewhere classified: Secondary | ICD-10-CM | POA: Diagnosis not present

## 2015-03-30 DIAGNOSIS — R269 Unspecified abnormalities of gait and mobility: Secondary | ICD-10-CM | POA: Diagnosis not present

## 2015-03-30 DIAGNOSIS — M25552 Pain in left hip: Secondary | ICD-10-CM | POA: Diagnosis not present

## 2015-03-30 DIAGNOSIS — R262 Difficulty in walking, not elsewhere classified: Secondary | ICD-10-CM | POA: Diagnosis not present

## 2015-03-31 DIAGNOSIS — Z96642 Presence of left artificial hip joint: Secondary | ICD-10-CM | POA: Diagnosis not present

## 2015-04-01 DIAGNOSIS — M25652 Stiffness of left hip, not elsewhere classified: Secondary | ICD-10-CM | POA: Diagnosis not present

## 2015-04-01 DIAGNOSIS — Z96642 Presence of left artificial hip joint: Secondary | ICD-10-CM | POA: Diagnosis not present

## 2015-04-01 DIAGNOSIS — M25552 Pain in left hip: Secondary | ICD-10-CM | POA: Diagnosis not present

## 2015-04-01 DIAGNOSIS — M62562 Muscle wasting and atrophy, not elsewhere classified, left lower leg: Secondary | ICD-10-CM | POA: Diagnosis not present

## 2015-04-01 DIAGNOSIS — R269 Unspecified abnormalities of gait and mobility: Secondary | ICD-10-CM | POA: Diagnosis not present

## 2015-04-01 DIAGNOSIS — R262 Difficulty in walking, not elsewhere classified: Secondary | ICD-10-CM | POA: Diagnosis not present

## 2015-04-08 DIAGNOSIS — Z96642 Presence of left artificial hip joint: Secondary | ICD-10-CM | POA: Diagnosis not present

## 2015-04-08 DIAGNOSIS — M25552 Pain in left hip: Secondary | ICD-10-CM | POA: Diagnosis not present

## 2015-04-12 DIAGNOSIS — Z96642 Presence of left artificial hip joint: Secondary | ICD-10-CM | POA: Diagnosis not present

## 2015-04-12 DIAGNOSIS — M25552 Pain in left hip: Secondary | ICD-10-CM | POA: Diagnosis not present

## 2015-04-13 DIAGNOSIS — M25552 Pain in left hip: Secondary | ICD-10-CM | POA: Diagnosis not present

## 2015-04-13 DIAGNOSIS — Z96642 Presence of left artificial hip joint: Secondary | ICD-10-CM | POA: Diagnosis not present

## 2015-04-14 DIAGNOSIS — M25552 Pain in left hip: Secondary | ICD-10-CM | POA: Diagnosis not present

## 2015-04-14 DIAGNOSIS — Z96642 Presence of left artificial hip joint: Secondary | ICD-10-CM | POA: Diagnosis not present

## 2015-04-15 DIAGNOSIS — M25552 Pain in left hip: Secondary | ICD-10-CM | POA: Diagnosis not present

## 2015-04-15 DIAGNOSIS — Z96642 Presence of left artificial hip joint: Secondary | ICD-10-CM | POA: Diagnosis not present

## 2015-04-19 DIAGNOSIS — Z96642 Presence of left artificial hip joint: Secondary | ICD-10-CM | POA: Diagnosis not present

## 2015-04-19 DIAGNOSIS — M25552 Pain in left hip: Secondary | ICD-10-CM | POA: Diagnosis not present

## 2015-04-20 DIAGNOSIS — M25552 Pain in left hip: Secondary | ICD-10-CM | POA: Diagnosis not present

## 2015-04-20 DIAGNOSIS — Z96642 Presence of left artificial hip joint: Secondary | ICD-10-CM | POA: Diagnosis not present

## 2015-04-22 DIAGNOSIS — Z96642 Presence of left artificial hip joint: Secondary | ICD-10-CM | POA: Diagnosis not present

## 2015-04-22 DIAGNOSIS — M25552 Pain in left hip: Secondary | ICD-10-CM | POA: Diagnosis not present

## 2015-04-26 DIAGNOSIS — M25552 Pain in left hip: Secondary | ICD-10-CM | POA: Diagnosis not present

## 2015-04-26 DIAGNOSIS — Z96642 Presence of left artificial hip joint: Secondary | ICD-10-CM | POA: Diagnosis not present

## 2015-04-29 DIAGNOSIS — Z96642 Presence of left artificial hip joint: Secondary | ICD-10-CM | POA: Diagnosis not present

## 2015-04-29 DIAGNOSIS — M25552 Pain in left hip: Secondary | ICD-10-CM | POA: Diagnosis not present

## 2015-05-02 ENCOUNTER — Other Ambulatory Visit: Payer: Self-pay | Admitting: Internal Medicine

## 2015-06-17 DIAGNOSIS — H109 Unspecified conjunctivitis: Secondary | ICD-10-CM | POA: Diagnosis not present

## 2015-06-30 ENCOUNTER — Ambulatory Visit (INDEPENDENT_AMBULATORY_CARE_PROVIDER_SITE_OTHER): Payer: Medicare Other | Admitting: Family Medicine

## 2015-06-30 ENCOUNTER — Encounter: Payer: Self-pay | Admitting: Family Medicine

## 2015-06-30 VITALS — BP 112/64 | HR 95 | Temp 98.7°F | Ht 61.5 in | Wt 163.5 lb

## 2015-06-30 DIAGNOSIS — H109 Unspecified conjunctivitis: Secondary | ICD-10-CM | POA: Insufficient documentation

## 2015-06-30 MED ORDER — OLOPATADINE HCL 0.2 % OP SOLN: OPHTHALMIC | Status: DC

## 2015-06-30 NOTE — Progress Notes (Signed)
   Subjective:  Patient ID: Shelly Arias, female    DOB: 1946-01-02  Age: 70 y.o. MRN: [id number]  CC: ? Pink eye  HPI:  70 year old female presents with complaints of possible pinkeye.  ? Pink eye  Patient was a one-week history of bilateral eye redness. Right greater than left.  She reports associated watery eyes.   No recent drainage/crusting.  No vision changes.   No other socially symptoms: Fevers, chills, URI symptoms.  She's been using over-the-counter eyedrops with no improvement.  No known exacerbating factors.  Social Hx   Social History   Social History  . Marital Status: Married    Spouse Name: N/A  . Number of Children: N/A  . Years of Education: N/A   Social History Main Topics  . Smoking status: Current Every Day Smoker -- 0.10 packs/day for 30 years    Types: Cigarettes  . Smokeless tobacco: None  . Alcohol Use: Yes     Comment: socially  . Drug Use: No  . Sexual Activity: No   Other Topics Concern  . None   Social History Narrative   Review of Systems  Constitutional: Negative for fever and chills.  Eyes: Positive for redness. Negative for discharge and visual disturbance.   Objective:  BP 112/64 mmHg  Pulse 95  Temp(Src) 98.7 F (37.1 C) (Oral)  Ht 5' 1.5" (1.562 m)  Wt 163 lb 8 oz (74.163 kg)  BMI 30.40 kg/m2  SpO2 96%  BP/Weight 06/30/2015 02/14/2015 31/59/4585  Systolic BP 929 244 628  Diastolic BP 64 61 72  Wt. (Lbs) 163.5 165.38 166.8  BMI 30.4 30.75 31.01   Physical Exam  Constitutional: She is oriented to person, place, and time. She appears well-developed. No distress.  HENT:  Head: Normocephalic and atraumatic.  Eyes:  Bilateral conjunctival injection particular medially. No drainage/discharge. PERRLA.   Pulmonary/Chest: Effort normal.  Neurological: She is alert and oriented to person, place, and time.  Psychiatric: She has a normal mood and affect.  Vitals reviewed.  Lab Results  Component Value Date   WBC  5.4 02/14/2015   HGB 13.3 02/14/2015   HCT 39.6 02/14/2015   PLT 179.0 02/14/2015   GLUCOSE 102* 02/14/2015   CHOL 166 02/14/2015   TRIG 73.0 02/14/2015   HDL 65.60 02/14/2015   LDLDIRECT 133.9 11/03/2010   LDLCALC 86 02/14/2015   ALT 12 02/14/2015   AST 16 02/14/2015   NA 137 02/14/2015   K 4.3 02/14/2015   CL 101 02/14/2015   CREATININE 0.72 02/14/2015   BUN 11 02/14/2015   CO2 29 02/14/2015   TSH 0.995 11/07/2011   INR 1.1* 02/14/2015   HGBA1C 6.2 01/31/2015   MICROALBUR <0.7 01/31/2015   Assessment & Plan:   Problem List Items Addressed This Visit    Conjunctivitis - Primary    New problem. No reports of drainage or matting. No drainage/discharge noted on exam. Bacteria etiology unlikely. She reports that she has had some watery eyes making this likely to be allergic in nature. Treating with Pataday.        Meds ordered this encounter  Medications  . Olopatadine HCl 0.2 % SOLN    Sig: 1 drop to each eye daily    Dispense:  2.5 mL    Refill:  3   Follow-up: PRN  [place]

## 2015-06-30 NOTE — Assessment & Plan Note (Signed)
New problem. No reports of drainage or matting. No drainage/discharge noted on exam. Bacteria etiology unlikely. She reports that she has had some watery eyes making this likely to be allergic in nature. Treating with Pataday.

## 2015-06-30 NOTE — Patient Instructions (Signed)
Allergic Conjunctivitis Allergic conjunctivitis is inflammation of the clear membrane that covers the white part of your eye and the inner surface of your eyelid (conjunctiva), and it is caused by allergies. The blood vessels in the conjunctiva become inflamed, and this causes the eye to become red or pink, and it often causes itchiness in the eye. Allergic conjunctivitis cannot be spread by one person to another person (noncontagious). CAUSES This condition is caused by an allergic reaction. Common causes of an allergic reaction (allergens) include:  Dust.  Pollen.  Mold.  Animal dander or secretions. RISK FACTORS This condition is more likely to develop if you are exposed to high levels of allergens that cause the allergic reaction. This might include being outdoors when air pollen levels are high or being around animals that you are allergic to. SYMPTOMS Symptoms of this condition may include:  Eye redness.  Tearing of the eyes.  Watery eyes.  Itchy eyes.  Burning feeling in the eyes.  Clear drainage from the eyes.  Swollen eyelids. DIAGNOSIS This condition may be diagnosed by medical history and physical exam. If you have drainage from your eyes, it may be tested to rule out other causes of conjunctivitis. TREATMENT Treatment for this condition often includes medicines. These may be eye drops, ointments, or oral medicines. They may be prescription medicines or over-the-counter medicines. HOME CARE INSTRUCTIONS  Take or apply medicines only as directed by your health care provider.  Do not touch or rub your eyes.  Do not wear contact lenses until the inflammation is gone. Wear glasses instead.  Do not wear eye makeup until the inflammation is gone.  Apply a cool, clean washcloth to your eye for 10-20 minutes, 3-4 times a day.  Try to avoid whatever allergen is causing the allergic reaction. SEEK MEDICAL CARE IF:  Your symptoms get worse.  You have pus draining  from your eye.  You have new symptoms.  You have a fever.   This information is not intended to replace advice given to you by your health care provider. Make sure you discuss any questions you have with your health care provider.   Document Released: 05/19/2002 Document Revised: 03/19/2014 Document Reviewed: 12/08/2013 Elsevier Interactive Patient Education 2016 Elsevier Inc.  

## 2015-07-07 ENCOUNTER — Encounter: Payer: Self-pay | Admitting: Internal Medicine

## 2015-07-07 ENCOUNTER — Other Ambulatory Visit: Payer: Self-pay | Admitting: *Deleted

## 2015-07-07 ENCOUNTER — Inpatient Hospital Stay (HOSPITAL_BASED_OUTPATIENT_CLINIC_OR_DEPARTMENT_OTHER): Payer: Medicare Other | Admitting: Internal Medicine

## 2015-07-07 ENCOUNTER — Inpatient Hospital Stay: Payer: Medicare Other | Attending: Internal Medicine

## 2015-07-07 VITALS — BP 149/79 | HR 98 | Temp 98.0°F | Ht 61.5 in | Wt 161.2 lb

## 2015-07-07 DIAGNOSIS — E785 Hyperlipidemia, unspecified: Secondary | ICD-10-CM | POA: Insufficient documentation

## 2015-07-07 DIAGNOSIS — Z85828 Personal history of other malignant neoplasm of skin: Secondary | ICD-10-CM

## 2015-07-07 DIAGNOSIS — H578 Other specified disorders of eye and adnexa: Secondary | ICD-10-CM

## 2015-07-07 DIAGNOSIS — D696 Thrombocytopenia, unspecified: Secondary | ICD-10-CM | POA: Insufficient documentation

## 2015-07-07 DIAGNOSIS — Z79899 Other long term (current) drug therapy: Secondary | ICD-10-CM

## 2015-07-07 DIAGNOSIS — M818 Other osteoporosis without current pathological fracture: Secondary | ICD-10-CM | POA: Insufficient documentation

## 2015-07-07 DIAGNOSIS — F17210 Nicotine dependence, cigarettes, uncomplicated: Secondary | ICD-10-CM

## 2015-07-07 DIAGNOSIS — Z923 Personal history of irradiation: Secondary | ICD-10-CM | POA: Diagnosis not present

## 2015-07-07 DIAGNOSIS — I10 Essential (primary) hypertension: Secondary | ICD-10-CM | POA: Insufficient documentation

## 2015-07-07 DIAGNOSIS — Z9221 Personal history of antineoplastic chemotherapy: Secondary | ICD-10-CM | POA: Insufficient documentation

## 2015-07-07 DIAGNOSIS — M1990 Unspecified osteoarthritis, unspecified site: Secondary | ICD-10-CM | POA: Diagnosis not present

## 2015-07-07 DIAGNOSIS — Z853 Personal history of malignant neoplasm of breast: Secondary | ICD-10-CM

## 2015-07-07 DIAGNOSIS — C50911 Malignant neoplasm of unspecified site of right female breast: Secondary | ICD-10-CM

## 2015-07-07 LAB — CBC WITH DIFFERENTIAL/PLATELET
BASOS PCT: 0 %
Basophils Absolute: 0 10*3/uL (ref 0–0.1)
Eosinophils Absolute: 0.1 10*3/uL (ref 0–0.7)
Eosinophils Relative: 1 %
HEMATOCRIT: 36.7 % (ref 35.0–47.0)
HEMOGLOBIN: 12.7 g/dL (ref 12.0–16.0)
LYMPHS ABS: 1.6 10*3/uL (ref 1.0–3.6)
Lymphocytes Relative: 25 %
MCH: 34.3 pg — AB (ref 26.0–34.0)
MCHC: 34.6 g/dL (ref 32.0–36.0)
MCV: 99.2 fL (ref 80.0–100.0)
MONO ABS: 0.4 10*3/uL (ref 0.2–0.9)
MONOS PCT: 6 %
NEUTROS ABS: 4.2 10*3/uL (ref 1.4–6.5)
NEUTROS PCT: 68 %
Platelets: 113 10*3/uL — ABNORMAL LOW (ref 150–440)
RBC: 3.7 MIL/uL — ABNORMAL LOW (ref 3.80–5.20)
RDW: 13.5 % (ref 11.5–14.5)
WBC: 6.2 10*3/uL (ref 3.6–11.0)

## 2015-07-07 LAB — COMPREHENSIVE METABOLIC PANEL
ALBUMIN: 4 g/dL (ref 3.5–5.0)
ALK PHOS: 71 U/L (ref 38–126)
ALT: 15 U/L (ref 14–54)
ANION GAP: 6 (ref 5–15)
AST: 19 U/L (ref 15–41)
BILIRUBIN TOTAL: 0.5 mg/dL (ref 0.3–1.2)
BUN: 10 mg/dL (ref 6–20)
CALCIUM: 9.2 mg/dL (ref 8.9–10.3)
CO2: 28 mmol/L (ref 22–32)
Chloride: 101 mmol/L (ref 101–111)
Creatinine, Ser: 0.71 mg/dL (ref 0.44–1.00)
GFR calc non Af Amer: >60 mL/min (ref >60)
Glucose, Bld: 115 mg/dL — ABNORMAL HIGH (ref 65–99)
POTASSIUM: 4.1 mmol/L (ref 3.5–5.1)
Sodium: 135 mmol/L (ref 135–145)
TOTAL PROTEIN: 6.7 g/dL (ref 6.5–8.1)

## 2015-07-07 NOTE — Progress Notes (Signed)
[facility] OFFICE PROGRESS NOTE  Patient Care Team: Jackolyn Confer, MD as PCP - General (Internal Medicine)   SUMMARY OF ONCOLOGIC HISTORY:  # 2006- RIGHT BREAST CA [pT1cN0M0; STAGE I; ER/PRPos; Her 2 Neu-NEG] s/p FEC x 6 [NSABP B-36]; Femara [Dec 2007-2012]  # Osteoporosis s/p Reclast; BMD- 2015-wnl- ca+vit D  # April 2017-isolated Thrombocytopenia- platelets- 113.   INTERVAL HISTORY:  This is my first interaction with the patient since I joined the practice September 2016. I reviewed the patient's prior charts/pertinent labs/imaging in detail; findings are summarized above.   Patient denies any unusual shortness of breath or chest pain or cough. No weight loss. No new lumps or bumps.  In the last few days patient noted to have red eyes itchy; along with sneezing. She has been using Patanol with no significant improvement. No gum bleeding nose bleeds.  REVIEW OF SYSTEMS:  A complete 10 point review of system is done which is negative except mentioned above/history of present illness.   PAST MEDICAL HISTORY :  Past Medical History  Diagnosis Date  . Hyperlipidemia   . Osteoporosis   . Osteoarthritis     right hip  . Hypertension   . Squamous cell carcinoma (HCC)     leg, Followed by Dr. Nicole Kindred  . Breast cancer ([place])     right, lumpectomy, radiation, chemo  . Breast cancer ([place])     Right, 2007  . History of chemotherapy   . History of radiation therapy     PAST SURGICAL HISTORY :   Past Surgical History  Procedure Laterality Date  . Total hip arthroplasty      right  . Bunion repair    . Abdominal hysterectomy    . Shoulder surgery    . Left breast biopsy    . Nasal sinus surgery    . Right hip replacement    . Squamous cell carcinoma excision      right leg, Dr. Nicole Kindred  . Breast biopsy Left 2002    left breast, calcifications  . Breast excisional biopsy Right 2007    positive    FAMILY HISTORY :   Family History  Problem Relation  Age of Onset  . Heart disease Father 6  . Heart disease Brother   . Diabetes Maternal Grandmother   . Heart disease Mother   . Hyperlipidemia Mother   . Breast cancer Cousin     SOCIAL HISTORY:   Social History  Substance Use Topics  . Smoking status: Current Every Day Smoker -- 0.10 packs/day for 30 years    Types: Cigarettes  . Smokeless tobacco: Never Used  . Alcohol Use: 0.0 oz/week    0 Standard drinks or equivalent per week     Comment: socially    ALLERGIES:  is allergic to fish allergy; morphine sulfate; and oxycodone.  MEDICATIONS:  Current Outpatient Prescriptions  Medication Sig Dispense Refill  . cetirizine (ZYRTEC) 10 MG tablet Take by mouth.    . Cholecalciferol (VITAMIN D3) 1000 units CAPS Take by mouth.    . Fexofenadine HCl (ALLEGRA PO) Take 1 capsule by mouth.    . magnesium 30 MG tablet Take 30 mg by mouth 2 (two) times daily.    . metoprolol succinate (TOPROL-XL) 25 MG 24 hr tablet TAKE 1 TABLET BY MOUTH EVERY DAY 90 tablet 3  . Olopatadine HCl 0.2 % SOLN 1 drop to each eye daily 2.5 mL 3  . pravastatin (PRAVACHOL) 40 MG tablet TAKE 1  TABLET BY MOUTH EVERY DAY 90 tablet 3  . Probiotic Product (PROBIOTIC & ACIDOPHILUS EX ST PO) Take by mouth daily.     No current facility-administered medications for this visit.    PHYSICAL EXAMINATION: ECOG PERFORMANCE STATUS: 0 - Asymptomatic  BP 149/79 mmHg  Pulse 98  Temp(Src) 98 F (36.7 C) (Tympanic)  Ht 5' 1.5" (1.562 m)  Wt 161 lb 3.2 oz (73.12 kg)  BMI 29.97 kg/m2  Filed Weights   07/07/15 1518  Weight: 161 lb 3.2 oz (73.12 kg)    GENERAL: Well-nourished well-developed; Alert, no distress and comfortable.   Alone. EYES: Bilateral conjunctival injection OROPHARYNX: no thrush or ulceration; good dentition  NECK: supple, no masses felt LYMPH:  no palpable lymphadenopathy in the cervical, axillary or inguinal regions LUNGS: clear to auscultation and  No wheeze or crackles HEART/CVS: regular rate &  rhythm and no murmurs; No lower extremity edema ABDOMEN:abdomen soft, non-tender and normal bowel sounds Musculoskeletal:no cyanosis of digits and no clubbing  PSYCH: alert & oriented x 3 with fluent speech NEURO: no focal motor/sensory deficits SKIN:  no rashes or significant lesions Right and left BREAST exam [in the presence of nurse]- no unusual skin changes or dominant masses felt. Surgical scars noted.    LABORATORY DATA:  I have reviewed the data as listed    Component Value Date/Time   NA 135 07/07/2015 1423   K 4.1 07/07/2015 1423   CL 101 07/07/2015 1423   CO2 28 07/07/2015 1423   GLUCOSE 115* 07/07/2015 1423   BUN 10 07/07/2015 1423   CREATININE 0.71 07/07/2015 1423   CREATININE 0.67 07/09/2014 1321   CREATININE 0.70 04/30/2014 1455   CALCIUM 9.2 07/07/2015 1423   PROT 6.7 07/07/2015 1423   PROT 7.3 07/09/2014 1321   ALBUMIN 4.0 07/07/2015 1423   ALBUMIN 4.3 07/09/2014 1321   AST 19 07/07/2015 1423   AST 19 07/09/2014 1321   ALT 15 07/07/2015 1423   ALT 18 07/09/2014 1321   ALKPHOS 71 07/07/2015 1423   ALKPHOS 66 07/09/2014 1321   BILITOT 0.5 07/07/2015 1423   BILITOT 0.6 07/09/2014 1321   GFRNONAA >60 07/07/2015 1423   GFRNONAA >60 07/09/2014 1321   GFRAA >60 07/07/2015 1423   GFRAA >60 07/09/2014 1321    No results found for: SPEP, UPEP  Lab Results  Component Value Date   WBC 6.2 07/07/2015   NEUTROABS 4.2 07/07/2015   HGB 12.7 07/07/2015   HCT 36.7 07/07/2015   MCV 99.2 07/07/2015   PLT 113* 07/07/2015      Chemistry      Component Value Date/Time   NA 135 07/07/2015 1423   K 4.1 07/07/2015 1423   CL 101 07/07/2015 1423   CO2 28 07/07/2015 1423   BUN 10 07/07/2015 1423   CREATININE 0.71 07/07/2015 1423   CREATININE 0.67 07/09/2014 1321   CREATININE 0.70 04/30/2014 1455      Component Value Date/Time   CALCIUM 9.2 07/07/2015 1423   ALKPHOS 71 07/07/2015 1423   ALKPHOS 66 07/09/2014 1321   AST 19 07/07/2015 1423   AST 19 07/09/2014  1321   ALT 15 07/07/2015 1423   ALT 18 07/09/2014 1321   BILITOT 0.5 07/07/2015 1423   BILITOT 0.6 07/09/2014 1321        ASSESSMENT & PLAN:   # RIGHT BREAST CA [pT1cN0M0; STAGE I; ER/PRPos; Her 2 Neu-NEG]; Clinically no evidence of recurrence. Patient's mammogram August 2016 normal.   # Thrombocytopenia platelets 113- unclear  etiology new onset. , Hemoglobin white count. Monitor for now- repeat platelet count in 4 weeks. If getting worse in further evaluation.   # plan follow up otherwise in 1 year MD/ labs.     [name], MD 07/07/2015 3:59 PM

## 2015-07-08 ENCOUNTER — Other Ambulatory Visit: Payer: No Typology Code available for payment source

## 2015-07-08 ENCOUNTER — Ambulatory Visit: Payer: No Typology Code available for payment source | Admitting: Internal Medicine

## 2015-07-08 LAB — CANCER ANTIGEN 27.29: CA 27.29: 30.7 U/mL (ref 0.0–38.6)

## 2015-07-19 DIAGNOSIS — L578 Other skin changes due to chronic exposure to nonionizing radiation: Secondary | ICD-10-CM | POA: Diagnosis not present

## 2015-07-19 DIAGNOSIS — D485 Neoplasm of uncertain behavior of skin: Secondary | ICD-10-CM | POA: Diagnosis not present

## 2015-07-19 DIAGNOSIS — B078 Other viral warts: Secondary | ICD-10-CM | POA: Diagnosis not present

## 2015-07-19 DIAGNOSIS — L820 Inflamed seborrheic keratosis: Secondary | ICD-10-CM | POA: Diagnosis not present

## 2015-07-26 DIAGNOSIS — H10811 Pingueculitis, right eye: Secondary | ICD-10-CM | POA: Diagnosis not present

## 2015-08-05 ENCOUNTER — Other Ambulatory Visit: Payer: Medicare Other

## 2015-08-09 ENCOUNTER — Other Ambulatory Visit: Payer: Self-pay | Admitting: *Deleted

## 2015-08-09 ENCOUNTER — Inpatient Hospital Stay: Payer: Medicare Other | Attending: Internal Medicine

## 2015-08-09 ENCOUNTER — Telehealth: Payer: Self-pay | Admitting: *Deleted

## 2015-08-09 DIAGNOSIS — Z853 Personal history of malignant neoplasm of breast: Secondary | ICD-10-CM

## 2015-08-09 DIAGNOSIS — D696 Thrombocytopenia, unspecified: Secondary | ICD-10-CM

## 2015-08-09 DIAGNOSIS — C50911 Malignant neoplasm of unspecified site of right female breast: Secondary | ICD-10-CM

## 2015-08-09 LAB — CBC WITH DIFFERENTIAL/PLATELET
BASOS PCT: 0 %
Basophils Absolute: 0 10*3/uL (ref 0–0.1)
EOS ABS: 0 10*3/uL (ref 0–0.7)
Eosinophils Relative: 1 %
HCT: 37.7 % (ref 35.0–47.0)
Hemoglobin: 13.2 g/dL (ref 12.0–16.0)
Lymphocytes Relative: 25 %
Lymphs Abs: 1.4 10*3/uL (ref 1.0–3.6)
MCH: 34.4 pg — ABNORMAL HIGH (ref 26.0–34.0)
MCHC: 34.9 g/dL (ref 32.0–36.0)
MCV: 98.6 fL (ref 80.0–100.0)
MONO ABS: 0.4 10*3/uL (ref 0.2–0.9)
MONOS PCT: 7 %
NEUTROS PCT: 67 %
Neutro Abs: 3.9 10*3/uL (ref 1.4–6.5)
Platelets: 116 10*3/uL — ABNORMAL LOW (ref 150–440)
RBC: 3.83 MIL/uL (ref 3.80–5.20)
RDW: 14 % (ref 11.5–14.5)
WBC: 5.8 10*3/uL (ref 3.6–11.0)

## 2015-08-09 NOTE — Telephone Encounter (Signed)
Called patient and left message that platelet are slightly improved from a month ago.  MD recommends repeating CBC in one month.  A scheduler will be getting in touch regarding next appointment.

## 2015-08-09 NOTE — Telephone Encounter (Signed)
-----   Message from [name], MD sent at 08/09/2015  1:26 PM EDT ----- Please inform patient that her platelets are slightly improved from a month ago; today 116; recommend CBC in 4 months. Thx

## 2015-08-16 ENCOUNTER — Encounter: Payer: Self-pay | Admitting: Podiatry

## 2015-08-16 ENCOUNTER — Ambulatory Visit (INDEPENDENT_AMBULATORY_CARE_PROVIDER_SITE_OTHER): Payer: Medicare Other | Admitting: Podiatry

## 2015-08-16 VITALS — BP 157/89 | HR 96 | Resp 18

## 2015-08-16 DIAGNOSIS — Q828 Other specified congenital malformations of skin: Secondary | ICD-10-CM | POA: Diagnosis not present

## 2015-08-16 NOTE — Progress Notes (Signed)
   Subjective:    Patient ID: Wynn Maudlin, female    DOB: December 15, 1945, 70 y.o.   MRN: [id number]  HPI  70 year old female presents the office they for concerns of calluses to both of her feet. She says the left one is been ongoing for several years however she has started his the right one form. She states the right is more painful the left. Denies having a foreign object. Denies any surrounding redness or drainage. No other complaints. She recently has tried to trim the areas without much relief.  Review of Systems  All other systems reviewed and are negative.      Objective:   Physical Exam General: AAO x3, NAD  Dermatological: Hyperkeratotic lesions present left submetatarsal 2 and right submetatarsal 3. There is punctate annular hyperkeratotic centers. No underlying ulceration, drainage or other signs of infection. No foreign body present. There is no surrounding erythema, ascending cellulitis. No other open lesions or pre-ulcerative lesions identified.  Vascular: Dorsalis Pedis artery and Posterior Tibial artery pedal pulses are 2/4 bilateral with immedate capillary fill time. Pedal hair growth present. No varicosities and no lower extremity edema present bilateral. There is no pain with calf compression, swelling, warmth, erythema.   Neruologic: Grossly intact via light touch bilateral. Vibratory intact via tuning fork bilateral. Protective threshold with Semmes Wienstein monofilament intact to all pedal sites bilateral. Patellar and Achilles deep tendon reflexes 2+ bilateral. No Babinski or clonus noted bilateral.   Musculoskeletal: There is prominent metatarsal heads plantarly with atrophy of the fat pad. Muscular strength 5/5 in all groups tested bilateral.  Gait: Unassisted, Nonantalgic.      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 Discussed reoccurrence. Offloading pads were dispensed. Dispensed metatarsal pads. Monitor for infection. Follow up as needed.  Celesta Gentile, DPM

## 2015-08-22 DIAGNOSIS — H2513 Age-related nuclear cataract, bilateral: Secondary | ICD-10-CM | POA: Diagnosis not present

## 2015-08-23 ENCOUNTER — Ambulatory Visit: Payer: Self-pay | Admitting: Podiatry

## 2015-10-19 ENCOUNTER — Other Ambulatory Visit: Payer: Self-pay | Admitting: Internal Medicine

## 2015-10-19 ENCOUNTER — Ambulatory Visit
Admission: RE | Admit: 2015-10-19 | Discharge: 2015-10-19 | Disposition: A | Discharge status: Home / Self Care | Payer: Medicare Other | Source: Ambulatory Visit | Attending: Internal Medicine | Admitting: Internal Medicine

## 2015-10-19 DIAGNOSIS — Z853 Personal history of malignant neoplasm of breast: Secondary | ICD-10-CM | POA: Diagnosis not present

## 2015-10-19 DIAGNOSIS — Z1231 Encounter for screening mammogram for malignant neoplasm of breast: Secondary | ICD-10-CM | POA: Insufficient documentation

## 2015-10-19 DIAGNOSIS — C50911 Malignant neoplasm of unspecified site of right female breast: Secondary | ICD-10-CM

## 2015-12-12 DIAGNOSIS — L400 Psoriasis vulgaris: Secondary | ICD-10-CM | POA: Diagnosis not present

## 2015-12-12 DIAGNOSIS — B078 Other viral warts: Secondary | ICD-10-CM | POA: Diagnosis not present

## 2015-12-12 DIAGNOSIS — Z85828 Personal history of other malignant neoplasm of skin: Secondary | ICD-10-CM | POA: Diagnosis not present

## 2015-12-12 DIAGNOSIS — L578 Other skin changes due to chronic exposure to nonionizing radiation: Secondary | ICD-10-CM | POA: Diagnosis not present

## 2015-12-12 DIAGNOSIS — D1800 Hemangioma unspecified site: Secondary | ICD-10-CM | POA: Diagnosis not present

## 2015-12-12 DIAGNOSIS — L814 Other melanin hyperpigmentation: Secondary | ICD-10-CM | POA: Diagnosis not present

## 2015-12-12 DIAGNOSIS — Z1283 Encounter for screening for malignant neoplasm of skin: Secondary | ICD-10-CM | POA: Diagnosis not present

## 2015-12-12 DIAGNOSIS — L821 Other seborrheic keratosis: Secondary | ICD-10-CM | POA: Diagnosis not present

## 2016-01-14 DIAGNOSIS — R05 Cough: Secondary | ICD-10-CM | POA: Diagnosis not present

## 2016-01-14 DIAGNOSIS — J019 Acute sinusitis, unspecified: Secondary | ICD-10-CM | POA: Diagnosis not present

## 2016-01-17 ENCOUNTER — Telehealth: Payer: Self-pay

## 2016-01-17 ENCOUNTER — Other Ambulatory Visit: Payer: Self-pay | Admitting: Family Medicine

## 2016-01-17 DIAGNOSIS — Z130 Encounter for screening for diseases of the blood and blood-forming organs and certain disorders involving the immune mechanism: Secondary | ICD-10-CM

## 2016-01-17 DIAGNOSIS — E785 Hyperlipidemia, unspecified: Secondary | ICD-10-CM

## 2016-01-17 DIAGNOSIS — R739 Hyperglycemia, unspecified: Secondary | ICD-10-CM

## 2016-01-17 DIAGNOSIS — I10 Essential (primary) hypertension: Secondary | ICD-10-CM

## 2016-01-17 NOTE — Telephone Encounter (Signed)
Pt coming for fasting labs 01/18/16. Only lab order is an old order from Dr. Gilford Rile. Pt has an appt to establish care with Dr. Lacinda Axon on 01/25/16. Please place future fasting lab orders. Thank you.

## 2016-01-18 ENCOUNTER — Other Ambulatory Visit (INDEPENDENT_AMBULATORY_CARE_PROVIDER_SITE_OTHER): Payer: Medicare Other

## 2016-01-18 DIAGNOSIS — R739 Hyperglycemia, unspecified: Secondary | ICD-10-CM

## 2016-01-18 DIAGNOSIS — I10 Essential (primary) hypertension: Secondary | ICD-10-CM

## 2016-01-18 DIAGNOSIS — E785 Hyperlipidemia, unspecified: Secondary | ICD-10-CM | POA: Diagnosis not present

## 2016-01-18 LAB — LIPID PANEL
CHOLESTEROL: 175 mg/dL (ref 0–200)
HDL: 69.5 mg/dL (ref >39.00)
LDL CALC: 86 mg/dL (ref 0–99)
NonHDL: 105.06
TRIGLYCERIDES: 93 mg/dL (ref 0.0–149.0)
Total CHOL/HDL Ratio: 3
VLDL: 18.6 mg/dL (ref 0.0–40.0)

## 2016-01-18 LAB — COMPREHENSIVE METABOLIC PANEL
ALT: 12 U/L (ref 0–35)
AST: 14 U/L (ref 0–37)
Albumin: 4.3 g/dL (ref 3.5–5.2)
Alkaline Phosphatase: 61 U/L (ref 39–117)
BUN: 11 mg/dL (ref 6–23)
CALCIUM: 9.5 mg/dL (ref 8.4–10.5)
CHLORIDE: 100 meq/L (ref 96–112)
CO2: 31 meq/L (ref 19–32)
Creatinine, Ser: 0.79 mg/dL (ref 0.40–1.20)
GFR: 76.84 mL/min (ref >60.00)
Glucose, Bld: 105 mg/dL — ABNORMAL HIGH (ref 70–99)
POTASSIUM: 4.6 meq/L (ref 3.5–5.1)
Sodium: 137 mEq/L (ref 135–145)
Total Bilirubin: 0.7 mg/dL (ref 0.2–1.2)
Total Protein: 6.6 g/dL (ref 6.0–8.3)

## 2016-01-18 LAB — HEMOGLOBIN A1C: HEMOGLOBIN A1C: 6.1 % (ref 4.6–6.5)

## 2016-01-25 ENCOUNTER — Ambulatory Visit (INDEPENDENT_AMBULATORY_CARE_PROVIDER_SITE_OTHER): Payer: Medicare Other | Admitting: Family Medicine

## 2016-01-25 ENCOUNTER — Encounter: Payer: Self-pay | Admitting: Family Medicine

## 2016-01-25 DIAGNOSIS — I10 Essential (primary) hypertension: Secondary | ICD-10-CM

## 2016-01-25 DIAGNOSIS — M8580 Other specified disorders of bone density and structure, unspecified site: Secondary | ICD-10-CM | POA: Diagnosis not present

## 2016-01-25 DIAGNOSIS — E785 Hyperlipidemia, unspecified: Secondary | ICD-10-CM

## 2016-01-25 DIAGNOSIS — Z720 Tobacco use: Secondary | ICD-10-CM

## 2016-01-25 DIAGNOSIS — R7303 Prediabetes: Secondary | ICD-10-CM | POA: Insufficient documentation

## 2016-01-25 NOTE — Assessment & Plan Note (Signed)
Encouraged to quit. She is not ready at this time.

## 2016-01-25 NOTE — Assessment & Plan Note (Signed)
Reasonably well-controlled.  Continue Pravachol.

## 2016-01-25 NOTE — Assessment & Plan Note (Addendum)
Stable. Arranging Dexa.

## 2016-01-25 NOTE — Patient Instructions (Signed)
We will call regarding your bone density.  Continue your meds.  Stop smoking.  Follow up in 6 months  Take care  Dr. Lacinda Axon

## 2016-01-25 NOTE — Assessment & Plan Note (Signed)
At goal. Continue metoprolol.

## 2016-01-25 NOTE — Progress Notes (Signed)
   Subjective:  Patient ID: Shelly Arias, female    DOB: [birth year]  Age: 70 y.o. MRN: [id number]  CC: Follow up  HPI:  70 year old female with tobacco abuse, osteoporosis, hyperlipidemia, history of breast cancer, hypertension presents for follow-up.  Hypertension  At goal on metoprolol.  Hyperlipidemia  Fairly well controlled on pravastatin. Most recent LDL was 86.  Osteopenia (with prior hx of osteoporosis)  No treatment currently.  Has been approximately 2 years since she had a DEXA scan.  Tobacco abuse  Still smoking. Has tried numerous options in the past including Wellbutrin and Chantix.  Is not currently interested in quitting.  Social Hx   Social History   Social History  . Marital status: Married    Spouse name: N/A  . Number of children: N/A  . Years of education: N/A   Social History Main Topics  . Smoking status: Current Every Day Smoker    Packs/day: 0.10    Years: 30.00    Types: Cigarettes  . Smokeless tobacco: Never Used  . Alcohol use 0.0 oz/week     Comment: socially  . Drug use: No  . Sexual activity: No   Other Topics Concern  . None   Social History Narrative  . None   Review of Systems  Constitutional: Negative.   Respiratory: Negative.   Cardiovascular: Negative.    Objective:  BP 130/66 (BP Location: Left Arm, Patient Position: Sitting, Cuff Size: Normal)   Pulse (!) 106   Temp 98 F (36.7 C) (Oral)   Resp 14   Wt 167 lb 2 oz (75.8 kg)   SpO2 95%   BMI 31.07 kg/m   BP/Weight 01/25/2016 08/16/2015 8/54/6270  Systolic BP 350 093 818  Diastolic BP 66 89 79  Wt. (Lbs) 167.13 - 161.2  BMI 31.07 - 29.97   Physical Exam  Constitutional: She is oriented to person, place, and time. She appears well-developed. No distress.  Cardiovascular: Normal rate and regular rhythm.   Pulmonary/Chest: Effort normal and breath sounds normal.  Neurological: She is alert and oriented to person, place, and time.  Psychiatric: She has  a normal mood and affect.  Vitals reviewed.   Lab Results  Component Value Date   WBC 5.8 08/09/2015   HGB 13.2 08/09/2015   HCT 37.7 08/09/2015   PLT 116 (L) 08/09/2015   GLUCOSE 105 (H) 01/18/2016   CHOL 175 01/18/2016   TRIG 93.0 01/18/2016   HDL 69.50 01/18/2016   LDLDIRECT 133.9 11/03/2010   LDLCALC 86 01/18/2016   ALT 12 01/18/2016   AST 14 01/18/2016   NA 137 01/18/2016   K 4.6 01/18/2016   CL 100 01/18/2016   CREATININE 0.79 01/18/2016   BUN 11 01/18/2016   CO2 31 01/18/2016   TSH 0.995 11/07/2011   INR 1.1 (H) 02/14/2015   HGBA1C 6.1 01/18/2016   MICROALBUR <0.7 01/31/2015    Assessment & Plan:   Problem List Items Addressed This Visit    Tobacco abuse    Encouraged to quit. She is not ready at this time.      Osteopenia    Stable. Arranging Dexa.      Hyperlipidemia    Reasonably well-controlled.  Continue Pravachol.      Essential hypertension    At goal. Continue metoprolol.        Follow-up: Return in about 6 months (around 07/24/2016).  [place]

## 2016-01-26 DIAGNOSIS — Z96643 Presence of artificial hip joint, bilateral: Secondary | ICD-10-CM | POA: Diagnosis not present

## 2016-01-31 ENCOUNTER — Ambulatory Visit: Payer: Medicare Other

## 2016-02-06 ENCOUNTER — Ambulatory Visit (INDEPENDENT_AMBULATORY_CARE_PROVIDER_SITE_OTHER): Payer: Medicare Other

## 2016-02-06 VITALS — BP 130/70 | HR 88 | Temp 97.9°F | Resp 14 | Ht 61.5 in | Wt 162.4 lb

## 2016-02-06 DIAGNOSIS — Z0000 Encounter for general adult medical examination without abnormal findings: Secondary | ICD-10-CM | POA: Diagnosis not present

## 2016-02-06 NOTE — Progress Notes (Signed)
Care was provided under my supervision. I agree with the management as indicated in the note.  Halo Laski DO  

## 2016-02-06 NOTE — Patient Instructions (Addendum)
Ms. Dominey , Thank you for taking time to come for your Medicare Wellness Visit. I appreciate your ongoing commitment to your health goals. Please review the following plan we discussed and let me know if I can assist you in the future.   FOLLOW UP WITH DR. COOK AS NEEDED.  These are the goals we discussed: Goals    . Increase water intake          Patient centered goal is to drink 2 bottles =4 cups of water daily.       This is a list of the screening recommended for you and due dates:  Health Maintenance  Topic Date Due  . Shingles Vaccine  09/15/2007  . Mammogram  10/18/2017  . Colon Cancer Screening  02/12/2018  . Tetanus Vaccine  11/07/2022  . Flu Shot  Completed  . DEXA scan (bone density measurement)  Completed  .  Hepatitis C: One time screening is recommended by Center for Disease Control  (CDC) for  adults born from [birth year] through 1963.   Completed  . Pneumonia vaccines  Completed      Steps to Quit Smoking Smoking tobacco can be bad for your health. It can also affect almost every organ in your body. Smoking puts you and people around you at risk for many serious long-lasting (chronic) diseases. Quitting smoking is hard, but it is one of the best things that you can do for your health. It is never too late to quit. What are the benefits of quitting smoking? When you quit smoking, you lower your risk for getting serious diseases and conditions. They can include:  Lung cancer or lung disease.  Heart disease.  Stroke.  Heart attack.  Not being able to have children (infertility).  Weak bones (osteoporosis) and broken bones (fractures). If you have coughing, wheezing, and shortness of breath, those symptoms may get better when you quit. You may also get sick less often. If you are pregnant, quitting smoking can help to lower your chances of having a baby of low birth weight. What can I do to help me quit smoking? Talk with your doctor about what can help you quit  smoking. Some things you can do (strategies) include:  Quitting smoking totally, instead of slowly cutting back how much you smoke over a period of time.  Going to in-person counseling. You are more likely to quit if you go to many counseling sessions.  Using resources and support systems, such as:  Online chats with a Social worker.  Phone quitlines.  Printed Furniture conservator/restorer.  Support groups or group counseling.  Text messaging programs.  Mobile phone apps or applications.  Taking medicines. Some of these medicines may have nicotine in them. If you are pregnant or breastfeeding, do not take any medicines to quit smoking unless your doctor says it is okay. Talk with your doctor about counseling or other things that can help you. Talk with your doctor about using more than one strategy at the same time, such as taking medicines while you are also going to in-person counseling. This can help make quitting easier. What things can I do to make it easier to quit? Quitting smoking might feel very hard at first, but there is a lot that you can do to make it easier. Take these steps:  Talk to your family and friends. Ask them to support and encourage you.  Call phone quitlines, reach out to support groups, or work with a Social worker.  Ask people who  smoke to not smoke around you.  Avoid places that make you want (trigger) to smoke, such as:  Bars.  Parties.  Smoke-break areas at work.  Spend time with people who do not smoke.  Lower the stress in your life. Stress can make you want to smoke. Try these things to help your stress:  Getting regular exercise.  Deep-breathing exercises.  Yoga.  Meditating.  Doing a body scan. To do this, close your eyes, focus on one area of your body at a time from head to toe, and notice which parts of your body are tense. Try to relax the muscles in those areas.  Download or buy apps on your mobile phone or tablet that can help you stick to  your quit plan. There are many free apps, such as QuitGuide from the [employer] Office manager for Disease Control and Prevention). You can find more support from smokefree.gov and other websites. This information is not intended to replace advice given to you by your health care provider. Make sure you discuss any questions you have with your health care provider. Document Released: 12/23/2008 Document Revised: 10/25/2015 Document Reviewed: 07/13/2014 Elsevier Interactive Patient Education  2017 [employer].

## 2016-02-06 NOTE — Progress Notes (Signed)
Subjective:   Shelly Arias is a 70 y.o. female who presents for Medicare Annual (Subsequent) preventive examination.  Review of Systems:  No ROS.  Medicare Wellness Visit. Cardiac Risk Factors include: advanced age (>46mn, >>76women);hypertension;obesity (BMI >30kg/m2)     Objective:     Vitals: BP 130/70 (BP Location: Left Arm, Patient Position: Sitting, Cuff Size: Normal)   Pulse 88   Temp 97.9 F (36.6 C) (Oral)   Resp 14   Ht 5' 1.5" (1.562 m)   Wt 162 lb 6.4 oz (73.7 kg)   SpO2 96%   BMI 30.19 kg/m   Body mass index is 30.19 kg/m.   Tobacco History  Smoking Status  . Current Every Day Smoker  . Packs/day: 0.10  . Years: 30.00  . Types: Cigarettes  Smokeless Tobacco  . Never Used     Ready to quit: Not Answered Counseling given: Not Answered   Past Medical History:  Diagnosis Date  . [place]    right, lumpectomy, radiation, chemo  . [place]    [place], [place]  . History of chemotherapy   . History of radiation therapy   . Hyperlipidemia   . Hypertension   . Osteoarthritis    right hip  . Osteoporosis   . Squamous cell carcinoma    leg, Followed by Dr. SNicole Kindred  Past Surgical History:  Procedure Laterality Date  . ABDOMINAL HYSTERECTOMY    . BREAST BIOPSY Left 2002   left breast, calcifications  . BREAST EXCISIONAL BIOPSY Right 2007   positive  . bunion repair    . left breast biopsy    . NASAL SINUS SURGERY    . right hip replacement    . SHOULDER SURGERY    . SQUAMOUS CELL CARCINOMA EXCISION     right leg, Dr. SNicole Kindred . TOTAL HIP ARTHROPLASTY     right   Family History  Problem Relation Age of Onset  . Heart disease Father 563 . Heart disease Mother   . Hyperlipidemia Mother   . Heart disease Brother   . Diabetes Maternal Grandmother   . Breast cancer Cousin    History  Sexual Activity  . Sexual activity: No    Outpatient Encounter Prescriptions as of 02/06/2016  Medication Sig  .  Cholecalciferol (VITAMIN D3) 1000 units CAPS Take by mouth.  . Fexofenadine HCl (ALLEGRA PO) Take 1 capsule by mouth.  . metoprolol succinate (TOPROL-XL) 25 MG 24 hr tablet TAKE 1 TABLET BY MOUTH EVERY DAY  . pravastatin (PRAVACHOL) 40 MG tablet TAKE 1 TABLET BY MOUTH EVERY DAY  . Probiotic Product (PROBIOTIC & ACIDOPHILUS EX ST PO) Take by mouth daily.   No facility-administered encounter medications on file as of 02/06/2016.     Activities of Daily Living In your present state of health, do you have any difficulty performing the following activities: 02/06/2016  Hearing? N  Vision? N  Difficulty concentrating or making decisions? N  Walking or climbing stairs? N  Dressing or bathing? N  Doing errands, shopping? N  Preparing Food and eating ? N  Using the Toilet? N  In the past six months, have you accidently leaked urine? Y  Do you have problems with loss of bowel control? N  Managing your Medications? N  Managing your Finances? N  Housekeeping or managing your Housekeeping? N  Some recent data might be hidden    Patient Care Team: JCoral Spikes DO as PCP - General (  Family Medicine) [name], DPM as Consulting Physician (Podiatry)    Assessment:    This is a routine wellness examination for Kayleeann. The goal of the wellness visit is to assist the patient how to close the gaps in care and create a preventative care plan for the patient.   Taking calcium VIT D as appropriate/Osteoporosis reviewed.  Medications reviewed; taking without issues or barriers.  Safety issues reviewed; lives with husband. Smoke and carbon monoxide detectors in the home. Firearms locked in a safe in the home. Wears seatbelts when driving or riding with others. No violence in the home.  No identified risk were noted; The patient was oriented x 3; appropriate in dress and manner and no objective failures at ADL's or IADL's.   BMI; discussed the importance of a healthy diet, water intake  and exercise. Educational material provided.  ZOSTAVAX vaccine deferred; awaiting new vaccine.   Smoking; current daily smoker. She is not ready to quit smoking today.  Plans to contact PCP regarding options.  Educational material provided.     Patient Concerns: None at this time. Follow up with PCP as needed.  Exercise Activities and Dietary recommendations Current Exercise Habits: Home exercise routine (Leg exercises/chair exercises), Type of exercise: walking, Time (Minutes): 20, Frequency (Times/Week): 4, Weekly Exercise (Minutes/Week): 80, Intensity: Moderate  Goals    . Increase water intake          Patient centered goal is to drink 2 bottles =4 cups of water daily.      Fall Risk Fall Risk  02/06/2016 01/31/2015 11/19/2013 11/06/2012  Falls in the past year? No No No No   Depression Screen PHQ 2/9 Scores 02/06/2016 01/31/2015 11/19/2013 11/06/2012  PHQ - 2 Score 0 0 0 0     Cognitive Function MMSE - Mini Mental State Exam 01/31/2015  Orientation to time 5  Orientation to Place 5  Registration 3  Attention/ Calculation 5  Recall 3  Language- name 2 objects 2  Language- repeat 1  Language- follow 3 step command 3  Language- read & follow direction 1  Write a sentence 1  Copy design 1  Total score 30     6CIT Screen 02/06/2016  What Year? 0 points  What month? 0 points  What time? 0 points  Count back from 20 0 points  Months in reverse 0 points  Repeat phrase 0 points  Total Score 0    Immunization History  Administered Date(s) Administered  . Influenza Split 12/12/2010, 01/01/2012  . Influenza Whole 01/14/2007, 12/07/2008  . Influenza,inj,Quad PF,36+ Mos 12/25/2012, 11/19/2013  . Influenza-Unspecified 01/07/2015, 11/29/2015  . Pneumococcal Conjugate-13 11/19/2013  . Pneumococcal Polysaccharide-23 11/06/2012  . Td 08/24/2002  . Tdap 11/06/2012   Screening Tests Health Maintenance  Topic Date Due  . ZOSTAVAX  09/15/2007  . MAMMOGRAM  10/18/2017    . COLONOSCOPY  02/12/2018  . TETANUS/TDAP  11/07/2022  . INFLUENZA VACCINE  Completed  . DEXA SCAN  Completed  . Hepatitis C Screening  Completed  . PNA vac Low Risk Adult  Completed      Plan:    End of life planning; Advance aging; Advanced directives discussed. Copy of current HCPOA/Living Will requested.  Medicare Attestation I have personally reviewed: The patient's medical and social history Their use of alcohol, tobacco or illicit drugs Their current medications and supplements The patient's functional ability including ADLs,fall risks, home safety risks, cognitive, and hearing and visual impairment Diet and physical activities Evidence for depression  The patient's weight, height, BMI, and visual acuity have been recorded in the chart.  I have made referrals and provided education to the patient based on review of the above and I have provided the patient with a written personalized care plan for preventive services.    During the course of the visit the patient was educated and counseled about the following appropriate screening and preventive services:   Vaccines to include Pneumoccal, Influenza, Hepatitis B, Td, Zostavax, HCV  Electrocardiogram  Cardiovascular Disease  Colorectal cancer screening  Bone density screening  Diabetes screening  Glaucoma screening  Mammography/PAP  Nutrition counseling   Patient Instructions (the written plan) was given to the patient.   Varney Biles, LPN  [id number]

## 2016-02-17 ENCOUNTER — Other Ambulatory Visit: Payer: Self-pay

## 2016-05-10 DIAGNOSIS — C3490 Malignant neoplasm of unspecified part of unspecified bronchus or lung: Secondary | ICD-10-CM

## 2016-05-10 DIAGNOSIS — C229 Malignant neoplasm of liver, not specified as primary or secondary: Secondary | ICD-10-CM

## 2016-05-10 HISTORY — DX: Malignant neoplasm of liver, not specified as primary or secondary: C22.9

## 2016-05-10 HISTORY — DX: Malignant neoplasm of unspecified part of unspecified bronchus or lung: C34.90

## 2016-05-11 ENCOUNTER — Other Ambulatory Visit: Payer: Self-pay | Admitting: Family Medicine

## 2016-05-11 MED ORDER — PRAVASTATIN SODIUM 40 MG PO TABS: 40.0000 mg | ORAL_TABLET | Freq: Every day | ORAL | 3 refills | Status: DC

## 2016-05-11 MED ORDER — METOPROLOL SUCCINATE ER 25 MG PO TB24: 25.0000 mg | ORAL_TABLET | Freq: Every day | ORAL | 3 refills | Status: DC

## 2016-06-08 ENCOUNTER — Inpatient Hospital Stay: Payer: Medicare Other

## 2016-06-08 ENCOUNTER — Inpatient Hospital Stay: Payer: Medicare Other | Attending: Internal Medicine | Admitting: Internal Medicine

## 2016-06-08 VITALS — BP 142/78 | HR 97 | Temp 97.0°F | Resp 18 | Wt 163.1 lb

## 2016-06-08 DIAGNOSIS — E785 Hyperlipidemia, unspecified: Secondary | ICD-10-CM | POA: Insufficient documentation

## 2016-06-08 DIAGNOSIS — C50911 Malignant neoplasm of unspecified site of right female breast: Secondary | ICD-10-CM

## 2016-06-08 DIAGNOSIS — Z803 Family history of malignant neoplasm of breast: Secondary | ICD-10-CM | POA: Diagnosis not present

## 2016-06-08 DIAGNOSIS — I10 Essential (primary) hypertension: Secondary | ICD-10-CM | POA: Diagnosis not present

## 2016-06-08 DIAGNOSIS — D539 Nutritional anemia, unspecified: Secondary | ICD-10-CM | POA: Insufficient documentation

## 2016-06-08 DIAGNOSIS — Z853 Personal history of malignant neoplasm of breast: Secondary | ICD-10-CM

## 2016-06-08 DIAGNOSIS — Z9221 Personal history of antineoplastic chemotherapy: Secondary | ICD-10-CM | POA: Diagnosis not present

## 2016-06-08 DIAGNOSIS — D61818 Other pancytopenia: Secondary | ICD-10-CM | POA: Diagnosis not present

## 2016-06-08 DIAGNOSIS — Z85828 Personal history of other malignant neoplasm of skin: Secondary | ICD-10-CM | POA: Diagnosis not present

## 2016-06-08 DIAGNOSIS — Z923 Personal history of irradiation: Secondary | ICD-10-CM | POA: Insufficient documentation

## 2016-06-08 DIAGNOSIS — M1990 Unspecified osteoarthritis, unspecified site: Secondary | ICD-10-CM | POA: Insufficient documentation

## 2016-06-08 DIAGNOSIS — N6320 Unspecified lump in the left breast, unspecified quadrant: Secondary | ICD-10-CM | POA: Diagnosis not present

## 2016-06-08 DIAGNOSIS — D696 Thrombocytopenia, unspecified: Secondary | ICD-10-CM

## 2016-06-08 DIAGNOSIS — C50811 Malignant neoplasm of overlapping sites of right female breast: Secondary | ICD-10-CM

## 2016-06-08 DIAGNOSIS — F17210 Nicotine dependence, cigarettes, uncomplicated: Secondary | ICD-10-CM | POA: Insufficient documentation

## 2016-06-08 DIAGNOSIS — Z79899 Other long term (current) drug therapy: Secondary | ICD-10-CM | POA: Diagnosis not present

## 2016-06-08 DIAGNOSIS — R5383 Other fatigue: Secondary | ICD-10-CM | POA: Insufficient documentation

## 2016-06-08 DIAGNOSIS — M818 Other osteoporosis without current pathological fracture: Secondary | ICD-10-CM | POA: Diagnosis not present

## 2016-06-08 DIAGNOSIS — Z170 Estrogen receptor positive status [ER+]: Secondary | ICD-10-CM | POA: Insufficient documentation

## 2016-06-08 DIAGNOSIS — R5382 Chronic fatigue, unspecified: Secondary | ICD-10-CM

## 2016-06-08 LAB — CBC WITH DIFFERENTIAL/PLATELET
BASOS ABS: 0 10*3/uL (ref 0–0.1)
Basophils Relative: 0 %
EOS PCT: 1 %
Eosinophils Absolute: 0 10*3/uL (ref 0–0.7)
HEMATOCRIT: 31 % — AB (ref 35.0–47.0)
Hemoglobin: 11 g/dL — ABNORMAL LOW (ref 12.0–16.0)
LYMPHS ABS: 1 10*3/uL (ref 1.0–3.6)
LYMPHS PCT: 29 %
MCH: 37.8 pg — AB (ref 26.0–34.0)
MCHC: 35.5 g/dL (ref 32.0–36.0)
MCV: 106.5 fL — AB (ref 80.0–100.0)
Monocytes Absolute: 0.3 10*3/uL (ref 0.2–0.9)
Monocytes Relative: 7 %
NEUTROS ABS: 2.2 10*3/uL (ref 1.4–6.5)
Neutrophils Relative %: 63 %
Platelets: 55 10*3/uL — ABNORMAL LOW (ref 150–440)
RBC: 2.92 MIL/uL — AB (ref 3.80–5.20)
RDW: 14.6 % — ABNORMAL HIGH (ref 11.5–14.5)
WBC: 3.5 10*3/uL — ABNORMAL LOW (ref 3.6–11.0)

## 2016-06-08 LAB — COMPREHENSIVE METABOLIC PANEL
ALK PHOS: 56 U/L (ref 38–126)
ALT: 15 U/L (ref 14–54)
AST: 21 U/L (ref 15–41)
Albumin: 4.1 g/dL (ref 3.5–5.0)
Anion gap: 8 (ref 5–15)
BILIRUBIN TOTAL: 0.7 mg/dL (ref 0.3–1.2)
BUN: 10 mg/dL (ref 6–20)
CALCIUM: 9.1 mg/dL (ref 8.9–10.3)
CO2: 26 mmol/L (ref 22–32)
CREATININE: 0.78 mg/dL (ref 0.44–1.00)
Chloride: 101 mmol/L (ref 101–111)
Glucose, Bld: 102 mg/dL — ABNORMAL HIGH (ref 65–99)
Potassium: 4.2 mmol/L (ref 3.5–5.1)
Sodium: 135 mmol/L (ref 135–145)
TOTAL PROTEIN: 7.1 g/dL (ref 6.5–8.1)

## 2016-06-08 LAB — FERRITIN: Ferritin: 240 ng/mL (ref 11–307)

## 2016-06-08 LAB — RETICULOCYTES
RBC.: 3.03 MIL/uL — AB (ref 3.80–5.20)
RETIC CT PCT: 2.9 % (ref 0.4–3.1)
Retic Count, Absolute: 87.9 10*3/uL (ref 19.0–183.0)

## 2016-06-08 LAB — PATHOLOGIST SMEAR REVIEW

## 2016-06-08 LAB — IRON AND TIBC
IRON: 111 ug/dL (ref 28–170)
SATURATION RATIOS: 35 % — AB (ref 10.4–31.8)
TIBC: 314 ug/dL (ref 250–450)
UIBC: 203 ug/dL

## 2016-06-08 LAB — LACTATE DEHYDROGENASE: LDH: 183 U/L (ref 98–192)

## 2016-06-08 LAB — C-REACTIVE PROTEIN: CRP: <0.8 mg/dL (ref <1.0)

## 2016-06-08 LAB — FOLATE: Folate: 12 ng/mL (ref >5.9)

## 2016-06-08 LAB — TSH: TSH: 0.753 u[IU]/mL (ref 0.350–4.500)

## 2016-06-08 LAB — VITAMIN B12: VITAMIN B 12: 196 pg/mL (ref 180–914)

## 2016-06-08 NOTE — Assessment & Plan Note (Addendum)
#  RIGHT BREAST CA [pT1cN0M0; STAGE I; ER/PRPos; Her 2 Neu-NEG]; Clinically no evidence of recurrence. Patient's mammogram August 2017 normal.   # Left breast upper outer margin. Proximal 1 cm nodule question etiology. Recommend US/mammogram.   # pancytopenia- ? Etiology; worsening thrombocytopenia platelets 55 hemoglobin 11; white count 3.5. Given history of exposure to anthracycline- concern for MDS; or malignancy involving the bone marrow. Recommend a bone marrow biopsy. Discussed the procedure/IR. Patient agreeable. Check iron studies L89 folic acid myeloma panel; thyroid studies. Also get a CT scan chest abdomen pelvis with contrast as soon as possible. Check peripheral smear.   # Above plan of care was discussed with the patient in detail. She is agreeable. Follow-up in approximately 7-10 days post workup. Offered to talk to pt's son-/vet in [place]; declines at this time.

## 2016-06-08 NOTE — Progress Notes (Signed)
Patient has discovered a "knot" in her left chest that she would like to have assessed.  This is the side where the port was placed.  She also has had 3 biopsies on that side as well as shoulder surgery.

## 2016-06-08 NOTE — Progress Notes (Signed)
[facility] OFFICE PROGRESS NOTE  Patient Care Team: Coral Spikes, DO as PCP - General (Family Medicine) [name], DPM as Consulting Physician (Podiatry)   SUMMARY OF ONCOLOGIC HISTORY:  Oncology History   # 2006- RIGHT BREAST CA [pT1cN0M0; STAGE I; ER/PRPos; Her 2 Neu-NEG] s/p FEC x 6 [NSABP B-36]; Femara [Dec 2007-2012]  # Osteoporosis s/p Reclast; BMD- 2015-wnl- ca+vit D  # April 2017-isolated Thrombocytopenia- platelets- 113.      Carcinoma of overlapping sites of right breast in female, estrogen receptor positive ([place])     INTERVAL HISTORY:  A very pleasant 71 year old female patient with a previous history of right breast cancer more than 10 years ago post chemotherapy- is here to be reevaluated for "bump" left breast.   She noted upper outer aspect of the left breast/ almost in the chest wall- centimeters sized approximately 4 weeks ago. This is not growing. Denies any pain. Denies any discharge or nipple changes.  Patient denies any unusual shortness of breath or chest pain or cough. No weight loss. Appetite is good. No gum bleeding nose bleeds. She denies any alcohol. Denies any new medications.   REVIEW OF SYSTEMS:  A complete 10 point review of system is done which is negative except mentioned above/history of present illness.   PAST MEDICAL HISTORY :  Past Medical History:  Diagnosis Date  . Breast cancer ([place])    right, lumpectomy, radiation, chemo  . Breast cancer ([place])    Right, 2007  . History of chemotherapy   . History of radiation therapy   . Hyperlipidemia   . Hypertension   . Osteoarthritis    right hip  . Osteoporosis   . Squamous cell carcinoma    leg, Followed by Dr. Nicole Kindred    PAST SURGICAL HISTORY :   Past Surgical History:  Procedure Laterality Date  . ABDOMINAL HYSTERECTOMY    . BREAST BIOPSY Left 2002   left breast, calcifications  . BREAST EXCISIONAL BIOPSY Right 2007   positive  . bunion repair    . left  breast biopsy    . NASAL SINUS SURGERY    . right hip replacement    . SHOULDER SURGERY    . SQUAMOUS CELL CARCINOMA EXCISION     right leg, Dr. Nicole Kindred  . TOTAL HIP ARTHROPLASTY     right    FAMILY HISTORY :   Family History  Problem Relation Age of Onset  . Heart disease Father 4  . Heart disease Mother   . Hyperlipidemia Mother   . Heart disease Brother   . Diabetes Maternal Grandmother   . Breast cancer Cousin     SOCIAL HISTORY:   Social History  Substance Use Topics  . Smoking status: Current Every Day Smoker    Packs/day: 0.10    Years: 30.00    Types: Cigarettes  . Smokeless tobacco: Never Used  . Alcohol use 0.0 oz/week     Comment: socially    ALLERGIES:  is allergic to fish allergy; morphine sulfate; and oxycodone.  MEDICATIONS:  Current Outpatient Prescriptions  Medication Sig Dispense Refill  . Cholecalciferol (VITAMIN D3) 1000 units CAPS Take by mouth.    . Fexofenadine HCl (ALLEGRA PO) Take 1 capsule by mouth.    . metoprolol succinate (TOPROL-XL) 25 MG 24 hr tablet Take 1 tablet (25 mg total) by mouth daily. 90 tablet 3  . pravastatin (PRAVACHOL) 40 MG tablet Take 1 tablet (40 mg total) by mouth daily. [place]  tablet 3  . Probiotic Product (PROBIOTIC & ACIDOPHILUS EX ST PO) Take by mouth daily.     No current facility-administered medications for this visit.     PHYSICAL EXAMINATION: ECOG PERFORMANCE STATUS: 0 - Asymptomatic  BP (!) 142/78 (BP Location: Right Arm, Patient Position: Sitting)   Pulse 97   Temp 97 F (36.1 C) (Tympanic)   Resp 18   Wt 163 lb 2 oz (74 kg)   BMI 30.32 kg/m   Filed Weights   06/08/16 0959  Weight: 163 lb 2 oz (74 kg)    GENERAL: Well-nourished well-developed; Alert, no distress and comfortable.   Alone. EYES: Bilateral conjunctival injection OROPHARYNX: no thrush or ulceration; good dentition  NECK: supple, no masses felt LYMPH:  no palpable lymphadenopathy in the cervical, axillary or inguinal regions LUNGS:  clear to auscultation and  No wheeze or crackles HEART/CVS: regular rate & rhythm and no murmurs; No lower extremity edema ABDOMEN:abdomen soft, non-tender and normal bowel sounds Musculoskeletal:no cyanosis of digits and no clubbing  PSYCH: alert & oriented x 3 with fluent speech NEURO: no focal motor/sensory deficits SKIN:  no rashes or significant lesions Right and left BREAST exam [in the presence of nurse]- Left breast 12:00- outer margin/ 1 cm nodule felt- nontender. Otherwise rest of the exam normal   LABORATORY DATA:  I have reviewed the data as listed    Component Value Date/Time   NA 135 06/08/2016 0900   K 4.2 06/08/2016 0900   CL 101 06/08/2016 0900   CO2 26 06/08/2016 0900   GLUCOSE 102 (H) 06/08/2016 0900   BUN 10 06/08/2016 0900   CREATININE 0.78 06/08/2016 0900   CREATININE 0.67 07/09/2014 1321   CREATININE 0.70 04/30/2014 1455   CALCIUM 9.1 06/08/2016 0900   PROT 7.1 06/08/2016 0900   PROT 7.3 07/09/2014 1321   ALBUMIN 4.1 06/08/2016 0900   ALBUMIN 4.3 07/09/2014 1321   AST 21 06/08/2016 0900   AST 19 07/09/2014 1321   ALT 15 06/08/2016 0900   ALT 18 07/09/2014 1321   ALKPHOS 56 06/08/2016 0900   ALKPHOS 66 07/09/2014 1321   BILITOT 0.7 06/08/2016 0900   BILITOT 0.6 07/09/2014 1321   GFRNONAA >60 06/08/2016 0900   GFRNONAA >60 07/09/2014 1321   GFRAA >60 06/08/2016 0900   GFRAA >60 07/09/2014 1321    No results found for: SPEP, UPEP  Lab Results  Component Value Date   WBC 3.5 (L) 06/08/2016   NEUTROABS 2.2 06/08/2016   HGB 11.0 (L) 06/08/2016   HCT 31.0 (L) 06/08/2016   MCV 106.5 (H) 06/08/2016   PLT 55 (L) 06/08/2016      Chemistry      Component Value Date/Time   NA 135 06/08/2016 0900   K 4.2 06/08/2016 0900   CL 101 06/08/2016 0900   CO2 26 06/08/2016 0900   BUN 10 06/08/2016 0900   CREATININE 0.78 06/08/2016 0900   CREATININE 0.67 07/09/2014 1321   CREATININE 0.70 04/30/2014 1455      Component Value Date/Time   CALCIUM 9.1  06/08/2016 0900   ALKPHOS 56 06/08/2016 0900   ALKPHOS 66 07/09/2014 1321   AST 21 06/08/2016 0900   AST 19 07/09/2014 1321   ALT 15 06/08/2016 0900   ALT 18 07/09/2014 1321   BILITOT 0.7 06/08/2016 0900   BILITOT 0.6 07/09/2014 1321        ASSESSMENT & PLAN:   Carcinoma of overlapping sites of right breast in female, estrogen receptor positive ([place]) #  RIGHT BREAST CA [pT1cN0M0; STAGE I; ER/PRPos; Her 2 Neu-NEG]; Clinically no evidence of recurrence. Patient's mammogram August 2017 normal.   # Left breast upper outer margin. Proximal 1 cm nodule question etiology. Recommend US/mammogram.   # pancytopenia- ? Etiology; worsening thrombocytopenia platelets 55 hemoglobin 11; white count 3.5. Given history of exposure to anthracycline- concern for MDS; or malignancy involving the bone marrow. Recommend a bone marrow biopsy. Discussed the procedure/IR. Patient agreeable. Check iron studies M35 folic acid myeloma panel; thyroid studies. Also get a CT scan chest abdomen pelvis with contrast as soon as possible. Check peripheral smear.   # Above plan of care was discussed with the patient in detail. She is agreeable. Follow-up in approximately 7-10 days post workup. Offered to talk to pt's son-/vet in [place]; declines at this time.      [name], MD 06/08/2016 1:05 PM

## 2016-06-09 LAB — CANCER ANTIGEN 27.29: CA 27.29: 38.3 U/mL (ref 0.0–38.6)

## 2016-06-11 LAB — MULTIPLE MYELOMA PANEL, SERUM
ALBUMIN SERPL ELPH-MCNC: 3.9 g/dL (ref 2.9–4.4)
ALPHA 1: 0.3 g/dL (ref 0.0–0.4)
ALPHA2 GLOB SERPL ELPH-MCNC: 0.7 g/dL (ref 0.4–1.0)
Albumin/Glob SerPl: 1.5 (ref 0.7–1.7)
B-Globulin SerPl Elph-Mcnc: 0.9 g/dL (ref 0.7–1.3)
Gamma Glob SerPl Elph-Mcnc: 0.8 g/dL (ref 0.4–1.8)
Globulin, Total: 2.7 g/dL (ref 2.2–3.9)
IGG (IMMUNOGLOBIN G), SERUM: 759 mg/dL (ref 700–1600)
IgA: 113 mg/dL (ref 87–352)
IgM, Serum: 52 mg/dL (ref 26–217)
TOTAL PROTEIN ELP: 6.6 g/dL (ref 6.0–8.5)

## 2016-06-11 LAB — KAPPA/LAMBDA LIGHT CHAINS
KAPPA FREE LGHT CHN: 15.5 mg/L (ref 3.3–19.4)
KAPPA, LAMDA LIGHT CHAIN RATIO: 1.65 (ref 0.26–1.65)
Lambda free light chains: 9.4 mg/L (ref 5.7–26.3)

## 2016-06-13 ENCOUNTER — Telehealth: Payer: Self-pay | Admitting: *Deleted

## 2016-06-13 ENCOUNTER — Ambulatory Visit
Admission: RE | Admit: 2016-06-13 | Discharge: 2016-06-13 | Disposition: A | Discharge status: Home / Self Care | Payer: Medicare Other | Source: Ambulatory Visit | Attending: Internal Medicine | Admitting: Internal Medicine

## 2016-06-13 DIAGNOSIS — R911 Solitary pulmonary nodule: Secondary | ICD-10-CM | POA: Diagnosis not present

## 2016-06-13 DIAGNOSIS — R590 Localized enlarged lymph nodes: Secondary | ICD-10-CM | POA: Diagnosis not present

## 2016-06-13 DIAGNOSIS — C50811 Malignant neoplasm of overlapping sites of right female breast: Secondary | ICD-10-CM | POA: Diagnosis not present

## 2016-06-13 DIAGNOSIS — D696 Thrombocytopenia, unspecified: Secondary | ICD-10-CM | POA: Diagnosis not present

## 2016-06-13 DIAGNOSIS — R918 Other nonspecific abnormal finding of lung field: Secondary | ICD-10-CM | POA: Diagnosis not present

## 2016-06-13 DIAGNOSIS — Z170 Estrogen receptor positive status [ER+]: Secondary | ICD-10-CM | POA: Insufficient documentation

## 2016-06-13 MED ORDER — IOPAMIDOL (ISOVUE-300) INJECTION 61%
100.0000 mL | Freq: Once | INTRAVENOUS | Status: AC | PRN
  Administered 2016-06-13: 100 mL via INTRAVENOUS

## 2016-06-13 NOTE — Telephone Encounter (Signed)
-----   Message from [name], MD sent at 06/13/2016 12:10 PM EDT ----- Please inform re: CT scan; will still need BMBx for further evaluation as planned. Recommend follow up with me 4-5 days post BMBx. Thx

## 2016-06-13 NOTE — Telephone Encounter (Signed)
Spoke with patient regarding her test results. Patient reassured that the dx work up did not yield the cause of the pancytopenia. Explained to patient that her spleen and liver were normal in size. There was an incidental finding of tiny pulmonary nodules, which are new; however, Dr. Rogue Bussing would not biopsy these at this time. Will watch and observe the pulmonary nodules. She asked if the breast mass appears on the ct scan. I told her that the radiologist did make note of the soft tissue mass-which was thought to be in the subcutaneous fat. I explained that her dx mammogram would provide clearer imaging. I advised pt to keep these apts on Friday. Her bone marrow biopsy is set up for next Monday. Pt advised to keep this bone marrow bx date. She was given an apt for 330pm on 05/22/16.  Discussed what to expect on her bone marrow biopsy date. Pt requests local anesthesia only if possible. Also discussed risk of bruising, bleeding and pain at the bone marrow biopsy site. Pt states that she will still bring a driver, just in case the procedure is performed with anesthesia. She thanked me for calling her.

## 2016-06-15 ENCOUNTER — Other Ambulatory Visit: Payer: Self-pay | Admitting: Internal Medicine

## 2016-06-15 ENCOUNTER — Ambulatory Visit
Admission: RE | Admit: 2016-06-15 | Discharge: 2016-06-15 | Disposition: A | Discharge status: Home / Self Care | Payer: Medicare Other | Source: Ambulatory Visit | Attending: Internal Medicine | Admitting: Internal Medicine

## 2016-06-15 ENCOUNTER — Other Ambulatory Visit: Payer: Self-pay | Admitting: Radiology

## 2016-06-15 DIAGNOSIS — Z170 Estrogen receptor positive status [ER+]: Secondary | ICD-10-CM | POA: Insufficient documentation

## 2016-06-15 DIAGNOSIS — C50811 Malignant neoplasm of overlapping sites of right female breast: Secondary | ICD-10-CM | POA: Diagnosis not present

## 2016-06-15 DIAGNOSIS — N6322 Unspecified lump in the left breast, upper inner quadrant: Secondary | ICD-10-CM | POA: Diagnosis not present

## 2016-06-15 DIAGNOSIS — N6320 Unspecified lump in the left breast, unspecified quadrant: Secondary | ICD-10-CM

## 2016-06-15 DIAGNOSIS — R928 Other abnormal and inconclusive findings on diagnostic imaging of breast: Secondary | ICD-10-CM

## 2016-06-15 DIAGNOSIS — N6310 Unspecified lump in the right breast, unspecified quadrant: Secondary | ICD-10-CM

## 2016-06-15 DIAGNOSIS — N6311 Unspecified lump in the right breast, upper outer quadrant: Secondary | ICD-10-CM | POA: Diagnosis not present

## 2016-06-18 ENCOUNTER — Other Ambulatory Visit (HOSPITAL_COMMUNITY)
Admission: RE | Admit: 2016-06-18 | Disposition: A | Discharge status: Home / Self Care | Payer: Medicare Other | Source: Other Acute Inpatient Hospital | Attending: Internal Medicine | Admitting: Internal Medicine

## 2016-06-18 ENCOUNTER — Telehealth: Payer: Self-pay | Admitting: *Deleted

## 2016-06-18 ENCOUNTER — Ambulatory Visit
Admission: RE | Admit: 2016-06-18 | Discharge: 2016-06-18 | Disposition: A | Discharge status: Home / Self Care | Payer: Medicare Other | Source: Ambulatory Visit | Attending: Internal Medicine | Admitting: Internal Medicine

## 2016-06-18 DIAGNOSIS — R7303 Prediabetes: Secondary | ICD-10-CM | POA: Diagnosis not present

## 2016-06-18 DIAGNOSIS — D538 Other specified nutritional anemias: Secondary | ICD-10-CM | POA: Diagnosis not present

## 2016-06-18 DIAGNOSIS — Z170 Estrogen receptor positive status [ER+]: Secondary | ICD-10-CM

## 2016-06-18 DIAGNOSIS — I10 Essential (primary) hypertension: Secondary | ICD-10-CM | POA: Diagnosis not present

## 2016-06-18 DIAGNOSIS — Z79899 Other long term (current) drug therapy: Secondary | ICD-10-CM | POA: Insufficient documentation

## 2016-06-18 DIAGNOSIS — D61818 Other pancytopenia: Secondary | ICD-10-CM | POA: Diagnosis not present

## 2016-06-18 DIAGNOSIS — C50811 Malignant neoplasm of overlapping sites of right female breast: Secondary | ICD-10-CM | POA: Diagnosis not present

## 2016-06-18 DIAGNOSIS — R5383 Other fatigue: Secondary | ICD-10-CM | POA: Diagnosis not present

## 2016-06-18 DIAGNOSIS — K219 Gastro-esophageal reflux disease without esophagitis: Secondary | ICD-10-CM | POA: Diagnosis not present

## 2016-06-18 DIAGNOSIS — D696 Thrombocytopenia, unspecified: Secondary | ICD-10-CM | POA: Diagnosis not present

## 2016-06-18 DIAGNOSIS — F17210 Nicotine dependence, cigarettes, uncomplicated: Secondary | ICD-10-CM | POA: Diagnosis not present

## 2016-06-18 DIAGNOSIS — M8588 Other specified disorders of bone density and structure, other site: Secondary | ICD-10-CM | POA: Diagnosis not present

## 2016-06-18 HISTORY — DX: Headache: R51

## 2016-06-18 HISTORY — DX: Headache, unspecified: R51.9

## 2016-06-18 LAB — CBC WITH DIFFERENTIAL/PLATELET
Basophils Absolute: 0 10*3/uL (ref 0–0.1)
Basophils Relative: 0 %
Eosinophils Absolute: 0 10*3/uL (ref 0–0.7)
Eosinophils Relative: 1 %
HCT: 32.4 % — ABNORMAL LOW (ref 35.0–47.0)
Hemoglobin: 11.3 g/dL — ABNORMAL LOW (ref 12.0–16.0)
Lymphocytes Relative: 28 %
Lymphs Abs: 0.9 10*3/uL — ABNORMAL LOW (ref 1.0–3.6)
MCH: 37.8 pg — ABNORMAL HIGH (ref 26.0–34.0)
MCHC: 34.9 g/dL (ref 32.0–36.0)
MCV: 108.5 fL — ABNORMAL HIGH (ref 80.0–100.0)
Monocytes Absolute: 0.2 10*3/uL (ref 0.2–0.9)
Monocytes Relative: 8 %
Neutro Abs: 2.1 10*3/uL (ref 1.4–6.5)
Neutrophils Relative %: 63 %
Platelets: 53 10*3/uL — ABNORMAL LOW (ref 150–440)
RBC: 2.99 MIL/uL — ABNORMAL LOW (ref 3.80–5.20)
RDW: 14.9 % — ABNORMAL HIGH (ref 11.5–14.5)
WBC: 3.3 10*3/uL — ABNORMAL LOW (ref 3.6–11.0)

## 2016-06-18 LAB — PROTIME-INR
INR: 0.95
PROTHROMBIN TIME: 12.7 s (ref 11.4–15.2)

## 2016-06-18 LAB — APTT: aPTT: 28 seconds (ref 24–36)

## 2016-06-18 MED ORDER — SODIUM CHLORIDE 0.9 % IV SOLN
INTRAVENOUS | Status: DC
  Administered 2016-06-18: 09:00:00 via INTRAVENOUS

## 2016-06-18 MED ORDER — MIDAZOLAM HCL 5 MG/5ML IJ SOLN
INTRAMUSCULAR | Status: AC | PRN
  Administered 2016-06-18: 0.5 mg via INTRAVENOUS
  Administered 2016-06-18 (×2): 1 mg via INTRAVENOUS

## 2016-06-18 MED ORDER — FENTANYL CITRATE (PF) 100 MCG/2ML IJ SOLN
INTRAMUSCULAR | Status: AC | PRN
  Administered 2016-06-18: 25 ug via INTRAVENOUS
  Administered 2016-06-18: 50 ug via INTRAVENOUS

## 2016-06-18 NOTE — H&P (Signed)
Chief Complaint: Patient was seen in consultation today for  at the request of Brahmanday,Govinda R  Referring Physician(s): Charlaine Dalton R  Patient Status: [facility] - Out-pt  History of Present Illness: Shelly Arias is a 71 y.o. female with a history of breast carcinoma and now being worked up for persistent thrombocytopenia and mild leukopenia.  Now requires bone marrow biopsy.  Past Medical History:  Diagnosis Date  . Breast cancer ([place])    right, lumpectomy, radiation, chemo  . Breast cancer ([place])    Right, 2007  . Headache   . History of chemotherapy   . History of radiation therapy   . Hyperlipidemia   . Hypertension   . Osteoarthritis    right hip  . Osteoporosis   . 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 ABDOMINAL HYSTERECTOMY    . BREAST BIOPSY Left 2002   left breast, calcifications  . BREAST EXCISIONAL BIOPSY Right 2007   positive  . bunion repair    . left breast biopsy    . NASAL SINUS SURGERY    . right hip replacement    . SHOULDER SURGERY    . SQUAMOUS CELL CARCINOMA EXCISION     right leg, Dr. Nicole Kindred  . TOTAL HIP ARTHROPLASTY     right    Allergies: Fish allergy; Morphine sulfate; and Oxycodone  Medications: Prior to Admission medications   Medication Sig Start Date End Date Taking? Authorizing Provider  Cholecalciferol (VITAMIN D3) 1000 units CAPS Take by mouth.   Yes Historical Provider, MD  Fexofenadine HCl (ALLEGRA PO) Take 1 capsule by mouth.   Yes Historical Provider, MD  metoprolol succinate (TOPROL-XL) 25 MG 24 hr tablet Take 1 tablet (25 mg total) by mouth daily. 05/11/16  Yes Coral Spikes, DO  pravastatin (PRAVACHOL) 40 MG tablet Take 1 tablet (40 mg total) by mouth daily. 05/11/16  Yes Coral Spikes, DO  Probiotic Product (PROBIOTIC & ACIDOPHILUS EX ST PO) Take by mouth daily.   Yes Historical Provider, MD     Family History  Problem Relation Age  of Onset  . Heart disease Father 1  . Heart disease Mother   . Hyperlipidemia Mother   . Heart disease Brother   . Diabetes Maternal Grandmother   . Breast cancer Cousin     Social History   Social History  . Marital status: Married    Spouse name: N/A  . Number of children: N/A  . Years of education: N/A   Social History Main Topics  . Smoking status: Current Every Day Smoker    Packs/day: 0.10    Years: 30.00    Types: Cigarettes  . Smokeless tobacco: Never Used  . Alcohol use 0.0 oz/week     Comment: socially  . Drug use: No  . Sexual activity: No   Other Topics Concern  . None   Social History Narrative  . None    ECOG Status: 0 - Asymptomatic  Review of Systems: A 12 point ROS discussed and pertinent positives are indicated in the HPI above.  All other systems are negative.  Review of Systems  Constitutional: Negative.   Respiratory: Negative.   Cardiovascular: Negative.   Gastrointestinal: Negative.   Genitourinary: Negative.   Musculoskeletal: Negative.   Neurological: Negative.     Vital Signs: BP (!) 145/71   Pulse 80   Temp 98.7 F (37.1 C) (Oral)  Resp (!) 27   Ht _0  (1.549 m)   SpO2 100%   Physical Exam  Constitutional: She is oriented to person, place, and time. She appears well-developed and well-nourished. No distress.  HENT:  Head: Normocephalic and atraumatic.  Neck: Neck supple. No JVD present. No tracheal deviation present. No thyromegaly present.  Cardiovascular: Normal rate and regular rhythm.  Exam reveals no gallop and no friction rub.   No murmur heard. Pulmonary/Chest: Effort normal and breath sounds normal. No stridor. No respiratory distress. She has no wheezes. She has no rales.  Abdominal: Soft. Bowel sounds are normal. She exhibits no distension and no mass. There is no tenderness. There is no rebound and no guarding.  Musculoskeletal: She exhibits no edema.  Lymphadenopathy:    She has no cervical adenopathy.    Neurological: She is alert and oriented to person, place, and time.  Skin: She is not diaphoretic.  Vitals reviewed.   Mallampati Score:  MD Evaluation Airway: WNL Heart: WNL Abdomen: WNL Chest/ Lungs: WNL ASA  Classification: 2 Mallampati/Airway Score: One  Imaging: Ct Chest W Contrast  Result Date: 06/13/2016 CLINICAL DATA:  New left upper chest palpable knot for 1 month. History of right breast cancer with lumpectomy, radiation and chemotherapy in 2007. History of right leg squamous cell carcinoma. EXAM: CT CHEST, ABDOMEN, AND PELVIS WITH CONTRAST TECHNIQUE: Multidetector CT imaging of the chest, abdomen and pelvis was performed following the standard protocol during bolus administration of intravenous contrast. CONTRAST:  112m ISOVUE-300 IOPAMIDOL (ISOVUE-300) INJECTION 61% COMPARISON:  Abdominopelvic CT 04/30/2014. FINDINGS: CT CHEST FINDINGS Cardiovascular: No acute vascular findings are seen. There is mild atherosclerosis of the aorta, great vessels and coronary arteries. The heart size is normal. There is no pericardial effusion. Mediastinum/Nodes: There is a mildly enlarged right hilar lymph node, measuring 14 x 13 mm on image 27. Low right pretracheal node measures 8 mm on image 23. No other enlarged mediastinal, hilar, internal mammary or axillary lymph nodes are seen. There is mild distal esophageal wall thickening. The thyroid gland demonstrates no significant findings. Lungs/Pleura: There is no pleural effusion. There is a dominant lobulated right upper lobe nodule measuring 13 x 7 mm on image 51, not previously imaged. Numerous other smaller pulmonary nodules are present bilaterally, many subpleural or perifissural in location. These include a subpleural right upper lobe nodule measuring 5 mm on image 32, a 5 mm nodule in the superior segment of the left lower lobe on image 57 and a 6 mm right lower lobe nodule on image 120. The latter is not clearly seen on the prior abdominal CT.  Patchy ground-glass opacity in the left lower lobe on image 107 is likely inflammatory. There is central airway thickening with focal occlusion of the right lower lobe bronchus (image 90), likely due to mucous plugging. No associated collapse. Musculoskeletal/Chest wall: No suspicious chest wall mass or suspicious osseous findings. Dermal thickening in the right breast is likely related to previous treatment. There are degenerative changes throughout the thoracic spine and at both shoulders. There appears to be a marker placed over the patient's palpable concern in the region of the left sternoclavicular joint (axial image 3). There is a tiny underlying soft tissue nodule in the subcutaneous fat, measuring 5 mm on image 5. There is an additional small soft tissue nodule in the anterior subcutaneous fat of the right chest (image 15) which may be in the breast tissue. There are additional tiny subcutaneous nodules in the anterior right chest (  image 4) and in the right back (image 16). CT ABDOMEN AND PELVIS FINDINGS Hepatobiliary: The liver is normal in density without focal abnormality. There is a small calcified gallstone. No gallbladder wall thickening or biliary dilatation. Pancreas: Unremarkable. No pancreatic ductal dilatation or surrounding inflammatory changes. Spleen: Normal in size without focal abnormality. Adrenals/Urinary Tract: Both adrenal glands appear normal. The kidneys appear normal without evidence of urinary tract calculus, suspicious lesion or hydronephrosis. No bladder abnormalities are seen. The bladder is partly obscured by artifact from the patient's bilateral total hip arthroplasties. Stomach/Bowel: No evidence of bowel wall thickening, distention or surrounding inflammatory change. Mild sigmoid diverticulosis. The appendix appears normal. Vascular/Lymphatic: There are no enlarged abdominal or pelvic lymph nodes. There are stable small retroperitoneal lymph nodes. There is aortic and branch  vessel atherosclerosis. No evidence of large vessel occlusion. Reproductive: Hysterectomy.  No evidence of adnexal mass. Other: Small umbilical hernia containing only fat. No ascites or peritoneal nodularity. Musculoskeletal: No acute or significant osseous findings. There are extensive degenerative changes throughout the lumbar spine associated with a convex left scoliosis. Bilateral total hip arthroplasties noted. IMPRESSION: 1. Tiny subcutaneous nodule in the upper left chest, corresponding with the patient's palpable concern. This is highly nonspecific and may reflect a small lymph node. There are additional scattered subcutaneous nodules in the right chest wall. 2. Bilateral pulmonary nodules, including a morphologically concerning lesion in the right upper lobe measuring up to 13 mm in diameter. Cannot exclude metastatic disease. 3. Mildly enlarged right hilar lymph node and prominent right paratracheal node. No other adenopathy. 4. No suspicious findings in the abdomen or pelvis. 5. Management options include PET-CT and follow-up chest CT in 3-6 months. The dominant right upper lobe lesion is large enough to be evaluated by PET-CT but the other findings are not. Electronically Signed   By: Richardean Sale M.D.   On: 06/13/2016 10:24   Ct Abdomen Pelvis W Contrast  Result Date: 06/13/2016 CLINICAL DATA:  New left upper chest palpable knot for 1 month. History of right breast cancer with lumpectomy, radiation and chemotherapy in 2007. History of right leg squamous cell carcinoma. EXAM: CT CHEST, ABDOMEN, AND PELVIS WITH CONTRAST TECHNIQUE: Multidetector CT imaging of the chest, abdomen and pelvis was performed following the standard protocol during bolus administration of intravenous contrast. CONTRAST:  191m ISOVUE-300 IOPAMIDOL (ISOVUE-300) INJECTION 61% COMPARISON:  Abdominopelvic CT 04/30/2014. FINDINGS: CT CHEST FINDINGS Cardiovascular: No acute vascular findings are seen. There is mild atherosclerosis  of the aorta, great vessels and coronary arteries. The heart size is normal. There is no pericardial effusion. Mediastinum/Nodes: There is a mildly enlarged right hilar lymph node, measuring 14 x 13 mm on image 27. Low right pretracheal node measures 8 mm on image 23. No other enlarged mediastinal, hilar, internal mammary or axillary lymph nodes are seen. There is mild distal esophageal wall thickening. The thyroid gland demonstrates no significant findings. Lungs/Pleura: There is no pleural effusion. There is a dominant lobulated right upper lobe nodule measuring 13 x 7 mm on image 51, not previously imaged. Numerous other smaller pulmonary nodules are present bilaterally, many subpleural or perifissural in location. These include a subpleural right upper lobe nodule measuring 5 mm on image 32, a 5 mm nodule in the superior segment of the left lower lobe on image 57 and a 6 mm right lower lobe nodule on image 120. The latter is not clearly seen on the prior abdominal CT. Patchy ground-glass opacity in the left lower lobe on image  107 is likely inflammatory. There is central airway thickening with focal occlusion of the right lower lobe bronchus (image 90), likely due to mucous plugging. No associated collapse. Musculoskeletal/Chest wall: No suspicious chest wall mass or suspicious osseous findings. Dermal thickening in the right breast is likely related to previous treatment. There are degenerative changes throughout the thoracic spine and at both shoulders. There appears to be a marker placed over the patient's palpable concern in the region of the left sternoclavicular joint (axial image 3). There is a tiny underlying soft tissue nodule in the subcutaneous fat, measuring 5 mm on image 5. There is an additional small soft tissue nodule in the anterior subcutaneous fat of the right chest (image 15) which may be in the breast tissue. There are additional tiny subcutaneous nodules in the anterior right chest (image  4) and in the right back (image 16). CT ABDOMEN AND PELVIS FINDINGS Hepatobiliary: The liver is normal in density without focal abnormality. There is a small calcified gallstone. No gallbladder wall thickening or biliary dilatation. Pancreas: Unremarkable. No pancreatic ductal dilatation or surrounding inflammatory changes. Spleen: Normal in size without focal abnormality. Adrenals/Urinary Tract: Both adrenal glands appear normal. The kidneys appear normal without evidence of urinary tract calculus, suspicious lesion or hydronephrosis. No bladder abnormalities are seen. The bladder is partly obscured by artifact from the patient's bilateral total hip arthroplasties. Stomach/Bowel: No evidence of bowel wall thickening, distention or surrounding inflammatory change. Mild sigmoid diverticulosis. The appendix appears normal. Vascular/Lymphatic: There are no enlarged abdominal or pelvic lymph nodes. There are stable small retroperitoneal lymph nodes. There is aortic and branch vessel atherosclerosis. No evidence of large vessel occlusion. Reproductive: Hysterectomy.  No evidence of adnexal mass. Other: Small umbilical hernia containing only fat. No ascites or peritoneal nodularity. Musculoskeletal: No acute or significant osseous findings. There are extensive degenerative changes throughout the lumbar spine associated with a convex left scoliosis. Bilateral total hip arthroplasties noted. IMPRESSION: 1. Tiny subcutaneous nodule in the upper left chest, corresponding with the patient's palpable concern. This is highly nonspecific and may reflect a small lymph node. There are additional scattered subcutaneous nodules in the right chest wall. 2. Bilateral pulmonary nodules, including a morphologically concerning lesion in the right upper lobe measuring up to 13 mm in diameter. Cannot exclude metastatic disease. 3. Mildly enlarged right hilar lymph node and prominent right paratracheal node. No other adenopathy. 4. No  suspicious findings in the abdomen or pelvis. 5. Management options include PET-CT and follow-up chest CT in 3-6 months. The dominant right upper lobe lesion is large enough to be evaluated by PET-CT but the other findings are not. Electronically Signed   By: Richardean Sale M.D.   On: 06/13/2016 10:24   US Breast Limited Uni Left Inc Axilla  Result Date: 06/15/2016 CLINICAL DATA:  71 year old female presenting for evaluation of a new palpable mass in the superior left breast identified on self-breast exam. The patient has history of a right breast lumpectomy in 2007. EXAM: 2D DIGITAL DIAGNOSTIC UNILATERAL LEFT MAMMOGRAM WITH CAD AND ADJUNCT TOMO LEFT BREAST ULTRASOUND COMPARISON:  Previous exam(s). ACR Breast Density Category b: There are scattered areas of fibroglandular density. FINDINGS: A BB has been placed on the far superior left breast at the palpable site of concern. Deep to the marker there is a a 9 mm oval circumscribed mass with a few angular margins. Otherwise, no other suspicious calcifications, masses or areas of distortion are seen in the left breast. Mammographic images were processed with  CAD. Physical exam of the palpable site of concern demonstrates a firm subcentimeter mass along the superior slightly medial chest wall. Ultrasound targeted to the left breast at 11-12 o'clock, 12 cm from the nipple demonstrates an irregular mass with angular margins measuring 8 x 5 x 6 mm. Blood flow is seen within the mass on color Doppler imaging. Incidentally noted is a smaller mass in the left breast at 10 o'clock, 5 cm from the nipple measuring 4 x 3 x 4 mm. Ultrasound of the left axilla demonstrates multiple normal-appearing lymph nodes. IMPRESSION: 1. There are 2 indeterminate masses in the upper inner quadrant of the left breast, 1 of which is at 11 o'clock and the other at 10 o'clock. 2.  No evidence of left axillary lymphadenopathy. RECOMMENDATION: Ultrasound-guided biopsy is recommended for the 2  masses in the left breast. I have discussed the findings and recommendations with the patient. Results were also provided in writing at the conclusion of the visit. If applicable, a reminder letter will be sent to the patient regarding the next appointment. BI-RADS CATEGORY  4: Suspicious. Electronically Signed   By: Ammie Ferrier M.D.   On: 06/15/2016 10:48   Mm Diag Breast Tomo Uni Left  Result Date: 06/15/2016 CLINICAL DATA:  71 year old female presenting for evaluation of a new palpable mass in the superior left breast identified on self-breast exam. The patient has history of a right breast lumpectomy in 2007. EXAM: 2D DIGITAL DIAGNOSTIC UNILATERAL LEFT MAMMOGRAM WITH CAD AND ADJUNCT TOMO LEFT BREAST ULTRASOUND COMPARISON:  Previous exam(s). ACR Breast Density Category b: There are scattered areas of fibroglandular density. FINDINGS: A BB has been placed on the far superior left breast at the palpable site of concern. Deep to the marker there is a a 9 mm oval circumscribed mass with a few angular margins. Otherwise, no other suspicious calcifications, masses or areas of distortion are seen in the left breast. Mammographic images were processed with CAD. Physical exam of the palpable site of concern demonstrates a firm subcentimeter mass along the superior slightly medial chest wall. Ultrasound targeted to the left breast at 11-12 o'clock, 12 cm from the nipple demonstrates an irregular mass with angular margins measuring 8 x 5 x 6 mm. Blood flow is seen within the mass on color Doppler imaging. Incidentally noted is a smaller mass in the left breast at 10 o'clock, 5 cm from the nipple measuring 4 x 3 x 4 mm. Ultrasound of the left axilla demonstrates multiple normal-appearing lymph nodes. IMPRESSION: 1. There are 2 indeterminate masses in the upper inner quadrant of the left breast, 1 of which is at 11 o'clock and the other at 10 o'clock. 2.  No evidence of left axillary lymphadenopathy. RECOMMENDATION:  Ultrasound-guided biopsy is recommended for the 2 masses in the left breast. I have discussed the findings and recommendations with the patient. Results were also provided in writing at the conclusion of the visit. If applicable, a reminder letter will be sent to the patient regarding the next appointment. BI-RADS CATEGORY  4: Suspicious. Electronically Signed   By: Ammie Ferrier M.D.   On: 06/15/2016 10:48    Labs:  CBC:  Recent Labs  07/07/15 1423 08/09/15 1030 06/08/16 0900 06/18/16 0804  WBC 6.2 5.8 3.5* 3.3*  HGB 12.7 13.2 11.0* 11.3*  HCT 36.7 37.7 31.0* 32.4*  PLT 113* 116* 55* 53*    COAGS:  Recent Labs  06/18/16 0804  INR 0.95  APTT 28    BMP:  Recent Labs  07/07/15 1423 01/18/16 0805 06/08/16 0900  NA 135 137 135  K 4.1 4.6 4.2  CL 101 100 101  CO2 _0 GLUCOSE 115* 105* 102*  BUN _1 CALCIUM 9.2 9.5 9.1  CREATININE 0.71 0.79 0.78  GFRNONAA >60  --  >60  GFRAA >60  --  >60    LIVER FUNCTION TESTS:  Recent Labs  07/07/15 1423 01/18/16 0805 06/08/16 0900  BILITOT 0.5 0.7 0.7  AST _2 ALT _3 ALKPHOS 71 61 56  PROT 6.7 6.6 7.1  ALBUMIN 4.0 4.3 4.1    Assessment and Plan:  For CT guided bone marrow biopsy today for workup of thrombocytopenia and leukopenia.  Risks and Benefits discussed with the patient including, but not limited to bleeding, infection, damage to adjacent structures or low yield requiring additional tests. All of the patient's questions were answered, patient is agreeable to proceed. Consent signed and in chart.  Thank you for this interesting consult.  I greatly enjoyed meeting Kelicia Youtz and look forward to participating in their care.  A copy of this report was sent to the requesting provider on this date.  Electronically SignedAletta Edouard T 06/18/2016, 9:17 AM   I spent a total of 15 Minutes  in face to face in clinical consultation, greater than 50% of which was  counseling/coordinating care for bone marrow biopsy.

## 2016-06-18 NOTE — Telephone Encounter (Signed)
Had a discussion earlier today with Dr. Rogue Arias and he wanted to know what surgeon the patient had seen in the past.  He wants to refer for surgical consult.  The patient has a birads 4 mammogram result and a history of breast cancer.  Talked to patient today.  She confirmed with me that she had seen Shelly Arias in the past and that is who she would like to see again.  Patient is scheduled to see Shelly Arias on April 11 at 8:15.  She is to arrive 30 minutes early to complete paperwork.  She is to take a photo ID and all her meds with her to the appointment.  States she is doing well from her bone biopsy she had earlier today.  She is to call if she has any questions or needs.

## 2016-06-18 NOTE — Procedures (Signed)
Interventional Radiology Procedure Note  Procedure: CT guided aspirate and core biopsy of right iliac bone Complications: None Recommendations: - Bedrest supine x 1 hrs - Follow biopsy results  Jaileen Janelle T. Saron Tweed, M.D Pager:  [phone]   

## 2016-06-20 ENCOUNTER — Ambulatory Visit (INDEPENDENT_AMBULATORY_CARE_PROVIDER_SITE_OTHER): Payer: Medicare Other | Admitting: General Surgery

## 2016-06-20 ENCOUNTER — Inpatient Hospital Stay: Payer: Self-pay

## 2016-06-20 ENCOUNTER — Encounter: Payer: Self-pay | Admitting: General Surgery

## 2016-06-20 VITALS — BP 142/70 | Resp 14 | Ht 61.0 in | Wt 163.0 lb

## 2016-06-20 DIAGNOSIS — N6320 Unspecified lump in the left breast, unspecified quadrant: Secondary | ICD-10-CM

## 2016-06-20 DIAGNOSIS — N6322 Unspecified lump in the left breast, upper inner quadrant: Secondary | ICD-10-CM | POA: Diagnosis not present

## 2016-06-20 DIAGNOSIS — C50212 Malignant neoplasm of upper-inner quadrant of left female breast: Secondary | ICD-10-CM | POA: Diagnosis not present

## 2016-06-20 DIAGNOSIS — C50919 Malignant neoplasm of unspecified site of unspecified female breast: Secondary | ICD-10-CM

## 2016-06-20 HISTORY — PX: BREAST BIOPSY: SHX20

## 2016-06-20 HISTORY — DX: Malignant neoplasm of unspecified site of unspecified female breast: C50.919

## 2016-06-20 NOTE — Progress Notes (Addendum)
Patient ID: Shelly Arias, female   DOB: [birth year], 71 y.o.   MRN: [id number]  Chief Complaint  Patient presents with  . Other    HPI Shelly Arias is a 71 y.o. female who presents for a breast evaluation. The most recent mammogram and left breast ultrasound  was done on 06/15/2016. Ct biopsy scan done on 06/18/2016. Patient noticed a left breast mass about tfour weeks ago. This was an incidental finding while she was sitting reading and happened to palpate the area.  No pain or tenderness. Patient does perform regular self breast checks and gets regular mammograms done. History of right breast cancer.  HPI  Past Medical History:  Diagnosis Date  . Breast cancer ([place])    right, lumpectomy, radiation, chemo  . Breast cancer ([place])    Right, 2007  . Headache   . History of chemotherapy   . History of radiation therapy   . Hyperlipidemia   . Hypertension   . Osteoarthritis    right hip  . Osteoporosis   . 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 ABDOMINAL HYSTERECTOMY    . BREAST BIOPSY Left 2002   left breast, calcifications  . BREAST EXCISIONAL BIOPSY Right 2007   positive  . bunion repair    . left breast biopsy    . NASAL SINUS SURGERY    . right hip replacement    . SHOULDER SURGERY    . SQUAMOUS CELL CARCINOMA EXCISION     right leg, Dr. Nicole Kindred  . TOTAL HIP ARTHROPLASTY     right    Family History  Problem Relation Age of Onset  . Heart disease Father 14  . Heart disease Mother   . Hyperlipidemia Mother   . Heart disease Brother   . Diabetes Maternal Grandmother   . Breast cancer Cousin     Social History Social History  Substance Use Topics  . Smoking status: Current Every Day Smoker    Packs/day: 0.10    Years: 30.00    Types: Cigarettes  . Smokeless tobacco: Never Used  . Alcohol use 0.0 oz/week     Comment: socially    Allergies  Allergen Reactions  . Fish  Allergy   . Morphine Sulfate     REACTION: anaphylaxis  . Oxycodone     Nausea and vomiting    Current Outpatient Prescriptions  Medication Sig Dispense Refill  . Cholecalciferol (VITAMIN D3) 1000 units CAPS Take by mouth.    . Fexofenadine HCl (ALLEGRA PO) Take 1 capsule by mouth.    . metoprolol succinate (TOPROL-XL) 25 MG 24 hr tablet Take 1 tablet (25 mg total) by mouth daily. 90 tablet 3  . pravastatin (PRAVACHOL) 40 MG tablet Take 1 tablet (40 mg total) by mouth daily. 90 tablet 3  . Probiotic Product (PROBIOTIC & ACIDOPHILUS EX ST PO) Take by mouth daily.     No current facility-administered medications for this visit.     Review of Systems Review of Systems  Constitutional: Negative.   Respiratory: Negative.   Cardiovascular: Negative.     Blood pressure (!) 142/70, resp. rate 14, height 5' 1"  (1.549 m), weight 163 lb (73.9 kg).  Physical Exam Physical Exam  Constitutional: She is oriented to person, place, and time. She appears well-developed and well-nourished.  Eyes: Conjunctivae are normal. No scleral icterus.  Neck: Neck supple.  Cardiovascular: Normal rate, regular rhythm  and normal heart sounds.   Pulmonary/Chest: Effort normal and breath sounds normal. Right breast exhibits mass. Right breast exhibits no inverted nipple, no nipple discharge (right breast mass 14cm at 12 o'clock), no skin change and no tenderness. Left breast exhibits mass (left breast 11 o'clock). Left breast exhibits no inverted nipple, no nipple discharge, no skin change and no tenderness.  Abdominal: Soft. Bowel sounds are normal. There is no tenderness.  Lymphadenopathy:    She has no cervical adenopathy.  Neurological: She is alert and oriented to person, place, and time.  Skin: Skin is warm and dry.    Data Reviewed Chest CT of 06/13/2016 reviewed. Nodular mass in the upper aspect of the right chest wall and left chest wall possibly residing within the breast.  Left breast mammogram  and associated ultrasound of 06/15/2016 reviewed. Index, palpable lesion, identified on ultrasound, less distinct on mammogram. Secondary lesion also was appreciated.  CT of the abdomen and pelvis of 06/13/2016 reviewed showed multiple pulmonary nodules. No intra-abdominal pathology.  Patient's original tumor in the 9:00 position of the right breast removed 06/20/2005 was a T1c, N0, 1.8 cm histologic grade 1-2 ER/PR positive, HER-2 negative treated with postoperative adjuvant therapy under NSABP protocol B 36 followed by whole breast radiation. 5 years of Femara completed between 2007 2012.  Excisional biopsy of the left breast dated 05/09/1990 showed fibrocystic changes.  Left breast biopsy dated 01/20/1993 showed fibrocystic changes with focal epithelial hyperplasia without atypia.  Left breast stereotactic biopsy dated 07/29/2000 showed ductal adenosis and microcalcifications without atypia.  Ultrasound examination of the right chest wall to assess the nodular density noted on the CT scan of 06/13/2016 was undertaken. At the 12:00 position 14 cm from the nipple there is a ill-defined heterogeneous hypoechoic mass measuring 0.5 x 0.55 x 0.71 cm. This is just above the pectoralis fascia and is thought to reside outside of the breast parenchyma. No increased vascular flow was noted on duplex imaging. BI-RADS-4.  Examination of the left chest to the area of palpable thickening at the 11:00 position 16 cm from the nipple shows a similar appearing hypoechoic mass with soft angularity and faint posterior acoustic shadowing. This measures 0.4 x 0.71 x 0.85 cm. No vascular flow was noted on duplex imaging.  The patient was amenable to core biopsy of the left breast lesion. 10 mL of 0.5% Xylocaine with 0.25% Marcaine with 1-200,000 of epinephrine was utilized well tolerated. Pre-and post-fire imaging showed the lesion was transversed. 6 core samples were obtained with minimal residual volume. A postbiopsy  clip was placed. The skin defect was closed with benzoin and Steri-Strips followed by Telfa and Tegaderm dressing. The procedure was well tolerated. Postbiopsy instructions were provided.  The most recent CBC of 06/18/2016 showed a hemoglobin of 11.3 with an MCV of 108, white blood cell count of 3300 with normal differential and a platelet count of 53,000.  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  Multiple small pulmonary nodules suggestive of a solid tumor metastatic process  Multiple chest wall abnormalities of unclear etiology.    Plan    The patient will be contacted when biopsy results are available.    HPI, Physical Exam, Assessment and Plan have been scribed under the direction and in the presence of J.W.Byrentt, MD  Gaspar Cola, CMA  I have completed the exam and reviewed the above documentation for accuracy and completeness.  I agree with the above.  Haematologist  has been used and any errors in dictation or transcription are unintentional.  Hervey Ard, M.D., F.A.C.S.   Robert Bellow 06/20/2016, 9:57 AM

## 2016-06-20 NOTE — Patient Instructions (Signed)

## 2016-06-21 ENCOUNTER — Encounter: Payer: Self-pay | Admitting: *Deleted

## 2016-06-22 ENCOUNTER — Inpatient Hospital Stay: Payer: Medicare Other | Attending: Internal Medicine | Admitting: Internal Medicine

## 2016-06-22 VITALS — BP 145/85 | HR 109 | Temp 98.1°F | Resp 18 | Wt 163.0 lb

## 2016-06-22 DIAGNOSIS — C50811 Malignant neoplasm of overlapping sites of right female breast: Secondary | ICD-10-CM | POA: Diagnosis not present

## 2016-06-22 DIAGNOSIS — E785 Hyperlipidemia, unspecified: Secondary | ICD-10-CM | POA: Insufficient documentation

## 2016-06-22 DIAGNOSIS — I10 Essential (primary) hypertension: Secondary | ICD-10-CM | POA: Diagnosis not present

## 2016-06-22 DIAGNOSIS — Z803 Family history of malignant neoplasm of breast: Secondary | ICD-10-CM | POA: Insufficient documentation

## 2016-06-22 DIAGNOSIS — Z79811 Long term (current) use of aromatase inhibitors: Secondary | ICD-10-CM | POA: Insufficient documentation

## 2016-06-22 DIAGNOSIS — Z9221 Personal history of antineoplastic chemotherapy: Secondary | ICD-10-CM | POA: Diagnosis not present

## 2016-06-22 DIAGNOSIS — R51 Headache: Secondary | ICD-10-CM | POA: Insufficient documentation

## 2016-06-22 DIAGNOSIS — M1990 Unspecified osteoarthritis, unspecified site: Secondary | ICD-10-CM | POA: Diagnosis not present

## 2016-06-22 DIAGNOSIS — Z79899 Other long term (current) drug therapy: Secondary | ICD-10-CM | POA: Diagnosis not present

## 2016-06-22 DIAGNOSIS — C7989 Secondary malignant neoplasm of other specified sites: Secondary | ICD-10-CM | POA: Insufficient documentation

## 2016-06-22 DIAGNOSIS — D61818 Other pancytopenia: Secondary | ICD-10-CM | POA: Diagnosis not present

## 2016-06-22 DIAGNOSIS — Z853 Personal history of malignant neoplasm of breast: Secondary | ICD-10-CM | POA: Diagnosis not present

## 2016-06-22 DIAGNOSIS — R918 Other nonspecific abnormal finding of lung field: Secondary | ICD-10-CM | POA: Diagnosis not present

## 2016-06-22 DIAGNOSIS — M818 Other osteoporosis without current pathological fracture: Secondary | ICD-10-CM | POA: Insufficient documentation

## 2016-06-22 DIAGNOSIS — R200 Anesthesia of skin: Secondary | ICD-10-CM | POA: Diagnosis not present

## 2016-06-22 DIAGNOSIS — F17210 Nicotine dependence, cigarettes, uncomplicated: Secondary | ICD-10-CM | POA: Insufficient documentation

## 2016-06-22 DIAGNOSIS — D696 Thrombocytopenia, unspecified: Secondary | ICD-10-CM | POA: Diagnosis not present

## 2016-06-22 DIAGNOSIS — Z85828 Personal history of other malignant neoplasm of skin: Secondary | ICD-10-CM | POA: Insufficient documentation

## 2016-06-22 DIAGNOSIS — Z923 Personal history of irradiation: Secondary | ICD-10-CM | POA: Diagnosis not present

## 2016-06-22 DIAGNOSIS — Z170 Estrogen receptor positive status [ER+]: Secondary | ICD-10-CM | POA: Insufficient documentation

## 2016-06-22 MED ORDER — PREDNISONE 20 MG PO TABS: ORAL_TABLET | ORAL | 0 refills | Status: DC

## 2016-06-22 NOTE — Progress Notes (Signed)
Patient here today for resent breast biopsy results.

## 2016-06-22 NOTE — Progress Notes (Signed)
Shelly Arias OFFICE PROGRESS NOTE  Patient Care Team: Shelly Spikes, DO as PCP - General (Family Medicine) [name], DPM as Consulting Physician (Podiatry) [name], MD as Consulting Physician (Internal Medicine) Robert Bellow, MD (General Surgery)   SUMMARY OF ONCOLOGIC HISTORY:  Oncology History   # 2006- RIGHT BREAST CA [pT1cN0M0; STAGE I; ER/PRPos; Her 2 Neu-NEG] s/p FEC x 6 [NSABP B-36]; Femara [Dec 2007-2012]  # Osteoporosis s/p Reclast; BMD- 2015-wnl- ca+vit D  # April 2017-isolated Thrombocytopenia- platelets- 113.      Carcinoma of overlapping sites of right breast in female, estrogen receptor positive ([name])     INTERVAL HISTORY:  A very pleasant 71 year old female patient With above remote history of right breast cancer- is here to review the results of the bone marrow biopsy-pancytopenia and also to review the results of the biopsy of the left chest wall nodule. Today she is accompanied by her daughter/and husband.  Patient states to be bruised from the biopsy. She denies any pain from the bone marrow biopsy.  Patient denies any unusual shortness of breath or chest pain or cough. No weight loss. Appetite is good. No gum bleeding nose bleeds.Denies any new medications.   REVIEW OF SYSTEMS:  A complete 10 point review of system is done which is negative except mentioned above/history of present illness.   PAST MEDICAL HISTORY :  Past Medical History:  Diagnosis Date  . Breast cancer ([place])    right, lumpectomy, radiation, chemo  . Breast cancer ([place])    Right, 2007  . 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  . Headache   . History of chemotherapy   . History of radiation therapy   . Hyperlipidemia   . Hypertension   . Osteoarthritis    right hip  . Osteoporosis   . Squamous cell carcinoma    leg, Followed by Dr. Nicole Kindred    PAST SURGICAL HISTORY :   Past Surgical History:  Procedure Laterality  Date  . ABDOMINAL HYSTERECTOMY    . BREAST BIOPSY Left 2002   left breast, calcifications  . BREAST BIOPSY Left 06/20/2016   INVASIVE DUCTAL CARCINOMA.  Marland Kitchen BREAST EXCISIONAL BIOPSY Right 2007   positive  . bunion repair    . left breast biopsy    . NASAL SINUS SURGERY    . right hip replacement    . SHOULDER SURGERY    . SQUAMOUS CELL CARCINOMA EXCISION     right leg, Dr. Nicole Kindred  . TOTAL HIP ARTHROPLASTY     right    FAMILY HISTORY :   Family History  Problem Relation Age of Onset  . Heart disease Father 29  . Heart disease Mother   . Hyperlipidemia Mother   . Heart disease Brother   . Diabetes Maternal Grandmother   . Breast cancer Cousin     SOCIAL HISTORY:   Social History  Substance Use Topics  . Smoking status: Current Every Day Smoker    Packs/day: 0.10    Years: 30.00    Types: Cigarettes  . Smokeless tobacco: Never Used  . Alcohol use 0.0 oz/week     Comment: socially    ALLERGIES:  is allergic to fish allergy; morphine sulfate; and oxycodone.  MEDICATIONS:  Current Outpatient Prescriptions  Medication Sig Dispense Refill  . Cholecalciferol (VITAMIN D3) 1000 units CAPS Take by mouth.    . Fexofenadine HCl (ALLEGRA PO) Take 1 capsule by mouth.    Marland Kitchen  metoprolol succinate (TOPROL-XL) 25 MG 24 hr tablet Take 1 tablet (25 mg total) by mouth daily. 90 tablet 3  . pravastatin (PRAVACHOL) 40 MG tablet Take 1 tablet (40 mg total) by mouth daily. 90 tablet 3  . Probiotic Product (PROBIOTIC & ACIDOPHILUS EX ST PO) Take by mouth daily.    . predniSONE (DELTASONE) 20 MG tablet 3 pills once a day; with breakfast; do not stop until instructed. 50 tablet 0   No current facility-administered medications for this visit.     PHYSICAL EXAMINATION: ECOG PERFORMANCE STATUS: 0 - Asymptomatic  BP (!) 145/85 (BP Location: Left Arm, Patient Position: Sitting)   Pulse (!) 109   Temp 98.1 F (36.7 C) (Tympanic)   Resp 18   Wt 163 lb (73.9 kg)   BMI 30.80 kg/m   Filed  Weights   06/22/16 1521  Weight: 163 lb (73.9 kg)    GENERAL: Well-nourished well-developed; Alert, no distress and comfortable.  Accompanied by daughter and husband. EYES: Bilateral conjunctival injection OROPHARYNX: no thrush or ulceration; good dentition  NECK: supple, no masses felt LYMPH:  no palpable lymphadenopathy in the cervical, axillary or inguinal regions LUNGS: clear to auscultation and  No wheeze or crackles HEART/CVS: regular rate & rhythm and no murmurs; No lower extremity edema ABDOMEN:abdomen soft, non-tender and normal bowel sounds Musculoskeletal:no cyanosis of digits and no clubbing  PSYCH: alert & oriented x 3 with fluent speech NEURO: no focal motor/sensory deficits SKIN:  Bruising noted at the site of the biopsy.   LABORATORY DATA:  I have reviewed the data as listed    Component Value Date/Time   NA 135 06/08/2016 0900   K 4.2 06/08/2016 0900   CL 101 06/08/2016 0900   CO2 26 06/08/2016 0900   GLUCOSE 102 (H) 06/08/2016 0900   BUN 10 06/08/2016 0900   CREATININE 0.78 06/08/2016 0900   CREATININE 0.67 07/09/2014 1321   CREATININE 0.70 04/30/2014 1455   CALCIUM 9.1 06/08/2016 0900   PROT 7.1 06/08/2016 0900   PROT 7.3 07/09/2014 1321   ALBUMIN 4.1 06/08/2016 0900   ALBUMIN 4.3 07/09/2014 1321   AST 21 06/08/2016 0900   AST 19 07/09/2014 1321   ALT 15 06/08/2016 0900   ALT 18 07/09/2014 1321   ALKPHOS 56 06/08/2016 0900   ALKPHOS 66 07/09/2014 1321   BILITOT 0.7 06/08/2016 0900   BILITOT 0.6 07/09/2014 1321   GFRNONAA >60 06/08/2016 0900   GFRNONAA >60 07/09/2014 1321   GFRAA >60 06/08/2016 0900   GFRAA >60 07/09/2014 1321    No results found for: SPEP, UPEP  Lab Results  Component Value Date   WBC 3.3 (L) 06/18/2016   NEUTROABS 2.1 06/18/2016   HGB 11.3 (L) 06/18/2016   HCT 32.4 (L) 06/18/2016   MCV 108.5 (H) 06/18/2016   PLT 53 (L) 06/18/2016      Chemistry      Component Value Date/Time   NA 135 06/08/2016 0900   K 4.2  06/08/2016 0900   CL 101 06/08/2016 0900   CO2 26 06/08/2016 0900   BUN 10 06/08/2016 0900   CREATININE 0.78 06/08/2016 0900   CREATININE 0.67 07/09/2014 1321   CREATININE 0.70 04/30/2014 1455      Component Value Date/Time   CALCIUM 9.1 06/08/2016 0900   ALKPHOS 56 06/08/2016 0900   ALKPHOS 66 07/09/2014 1321   AST 21 06/08/2016 0900   AST 19 07/09/2014 1321   ALT 15 06/08/2016 0900   ALT 18 07/09/2014 1321  BILITOT 0.7 06/08/2016 0900   BILITOT 0.6 07/09/2014 1321        ASSESSMENT & PLAN:   Carcinoma of overlapping sites of right breast in female, estrogen receptor positive ([place]) # Left chest wall/upper outer breast nodule biopsy- positive for invasive ductal carcinoma. Breast profile pending. A 5 mm nodule in the breast-not biopsied.   # Patient also has a similar ~5 mm nodule on the right chest wall [not biopsied.].   # CT scan- approximately 3- 5 mm right lung nodules noted multiple; however approximate 12 mm nodule noted in the right upper lobe. Recommend a PET scan for further evaluation; staging purposes.  # Pancytopenia with predominantly- thrombocytopenia platelet count of 53. Bone marrow-no obvious evidence of metastatic disease or myelodysplastic process. Fish panel pending. Left a message for pathologist to review the bone marrow. Question ITP. Recommend prednisone 60 mg a day with food. Discussed the potential side effects.  #Hx of  RIGHT BREAST CA [pT1cN0M0; STAGE I; ER/PRPos; Her 2 Neu-NEG; Dx-2006].    # A long discussion the patient and the family regarding the unusual nature of the presentation/ however above workup is further more information/next plan of care. Patient might need MRI of the breast- based upon above workup. Also discussed with Dr. Tollie Pizza.  # Patient follow-up with me in few days after the PET scan to review the results/next plan of care. Check CBC/DIC.  # I reviewed the blood work- with the patient in detail; also reviewed the imaging  independently [as summarized above]; and with the patient in detail.      [name], MD 06/22/2016 4:56 PM

## 2016-06-22 NOTE — Assessment & Plan Note (Addendum)
#   Left chest wall/upper outer breast nodule biopsy- positive for invasive ductal carcinoma. Breast profile pending. A 5 mm nodule in the breast-not biopsied.   # Patient also has a similar ~5 mm nodule on the right chest wall [not biopsied.].   # CT scan- approximately 3- 5 mm right lung nodules noted multiple; however approximate 12 mm nodule noted in the right upper lobe. Recommend a PET scan for further evaluation; staging purposes.  # Pancytopenia with predominantly- thrombocytopenia platelet count of 53. Bone marrow-no obvious evidence of metastatic disease or myelodysplastic process. Fish panel pending. Left a message for pathologist to review the bone marrow. Question ITP. Recommend prednisone 60 mg a day with food. Discussed the potential side effects.  #Hx of  RIGHT BREAST CA [pT1cN0M0; STAGE I; ER/PRPos; Her 2 Neu-NEG; Dx-2006].    # A long discussion the patient and the family regarding the unusual nature of the presentation/ however above workup is further more information/next plan of care. Patient might need MRI of the breast- based upon above workup. Also discussed with Dr. Tollie Pizza.  # Patient follow-up with me in few days after the PET scan to review the results/next plan of care. Check CBC/DIC.  # I reviewed the blood work- with the patient in detail; also reviewed the imaging independently [as summarized above]; and with the patient in detail.

## 2016-06-27 ENCOUNTER — Encounter: Payer: Self-pay | Admitting: General Surgery

## 2016-06-27 LAB — CHROMOSOME ANALYSIS, BONE MARROW

## 2016-06-27 LAB — TISSUE HYBRIDIZATION (BONE MARROW)-NCBH

## 2016-06-28 ENCOUNTER — Ambulatory Visit
Admission: RE | Admit: 2016-06-28 | Discharge: 2016-06-28 | Disposition: A | Discharge status: Home / Self Care | Payer: Medicare Other | Source: Ambulatory Visit | Attending: Internal Medicine | Admitting: Internal Medicine

## 2016-06-28 ENCOUNTER — Inpatient Hospital Stay: Payer: Medicare Other

## 2016-06-28 DIAGNOSIS — R590 Localized enlarged lymph nodes: Secondary | ICD-10-CM | POA: Insufficient documentation

## 2016-06-28 DIAGNOSIS — C50811 Malignant neoplasm of overlapping sites of right female breast: Secondary | ICD-10-CM | POA: Diagnosis not present

## 2016-06-28 DIAGNOSIS — I700 Atherosclerosis of aorta: Secondary | ICD-10-CM | POA: Insufficient documentation

## 2016-06-28 DIAGNOSIS — M4186 Other forms of scoliosis, lumbar region: Secondary | ICD-10-CM | POA: Insufficient documentation

## 2016-06-28 DIAGNOSIS — R51 Headache: Secondary | ICD-10-CM | POA: Diagnosis not present

## 2016-06-28 DIAGNOSIS — R932 Abnormal findings on diagnostic imaging of liver and biliary tract: Secondary | ICD-10-CM | POA: Insufficient documentation

## 2016-06-28 DIAGNOSIS — Z170 Estrogen receptor positive status [ER+]: Secondary | ICD-10-CM

## 2016-06-28 DIAGNOSIS — C50911 Malignant neoplasm of unspecified site of right female breast: Secondary | ICD-10-CM | POA: Diagnosis not present

## 2016-06-28 DIAGNOSIS — K8020 Calculus of gallbladder without cholecystitis without obstruction: Secondary | ICD-10-CM | POA: Insufficient documentation

## 2016-06-28 DIAGNOSIS — D696 Thrombocytopenia, unspecified: Secondary | ICD-10-CM

## 2016-06-28 DIAGNOSIS — Z79899 Other long term (current) drug therapy: Secondary | ICD-10-CM | POA: Diagnosis not present

## 2016-06-28 DIAGNOSIS — R918 Other nonspecific abnormal finding of lung field: Secondary | ICD-10-CM | POA: Diagnosis not present

## 2016-06-28 DIAGNOSIS — C7989 Secondary malignant neoplasm of other specified sites: Secondary | ICD-10-CM | POA: Diagnosis not present

## 2016-06-28 DIAGNOSIS — D61818 Other pancytopenia: Secondary | ICD-10-CM | POA: Diagnosis not present

## 2016-06-28 DIAGNOSIS — Z79811 Long term (current) use of aromatase inhibitors: Secondary | ICD-10-CM | POA: Diagnosis not present

## 2016-06-28 LAB — CBC WITH DIFFERENTIAL/PLATELET
BASOS ABS: 0 10*3/uL (ref 0–0.1)
BASOS PCT: 0 %
EOS ABS: 0 10*3/uL (ref 0–0.7)
EOS PCT: 0 %
HCT: 34.2 % — ABNORMAL LOW (ref 35.0–47.0)
Hemoglobin: 11.8 g/dL — ABNORMAL LOW (ref 12.0–16.0)
LYMPHS ABS: 0.7 10*3/uL — AB (ref 1.0–3.6)
Lymphocytes Relative: 11 %
MCH: 37.2 pg — AB (ref 26.0–34.0)
MCHC: 34.4 g/dL (ref 32.0–36.0)
MCV: 108.1 fL — ABNORMAL HIGH (ref 80.0–100.0)
Monocytes Absolute: 0.1 10*3/uL — ABNORMAL LOW (ref 0.2–0.9)
Monocytes Relative: 3 %
NEUTROS PCT: 86 %
Neutro Abs: 5 10*3/uL (ref 1.4–6.5)
PLATELETS: 61 10*3/uL — AB (ref 150–440)
RBC: 3.16 MIL/uL — AB (ref 3.80–5.20)
RDW: 15.8 % — ABNORMAL HIGH (ref 11.5–14.5)
WBC: 5.8 10*3/uL (ref 3.6–11.0)

## 2016-06-28 LAB — COMPREHENSIVE METABOLIC PANEL
ALBUMIN: 4.4 g/dL (ref 3.5–5.0)
ALT: 19 U/L (ref 14–54)
AST: 21 U/L (ref 15–41)
Alkaline Phosphatase: 61 U/L (ref 38–126)
Anion gap: 6 (ref 5–15)
BUN: 24 mg/dL — ABNORMAL HIGH (ref 6–20)
CHLORIDE: 100 mmol/L — AB (ref 101–111)
CO2: 28 mmol/L (ref 22–32)
CREATININE: 0.92 mg/dL (ref 0.44–1.00)
Calcium: 9.3 mg/dL (ref 8.9–10.3)
GFR calc non Af Amer: >60 mL/min (ref >60)
GLUCOSE: 122 mg/dL — AB (ref 65–99)
Potassium: 4.3 mmol/L (ref 3.5–5.1)
Sodium: 134 mmol/L — ABNORMAL LOW (ref 135–145)
Total Bilirubin: 0.8 mg/dL (ref 0.3–1.2)
Total Protein: 7.3 g/dL (ref 6.5–8.1)

## 2016-06-28 LAB — FIBRIN DERIVATIVES D-DIMER (ARMC ONLY): FIBRIN DERIVATIVES D-DIMER (ARMC): 625.02 — AB (ref 0.00–499.00)

## 2016-06-28 LAB — PROTIME-INR
INR: 1.02
PROTHROMBIN TIME: 13.4 s (ref 11.4–15.2)

## 2016-06-28 LAB — APTT: aPTT: <24 seconds — ABNORMAL LOW (ref 24–36)

## 2016-06-28 LAB — FIBRINOGEN: FIBRINOGEN: 351 mg/dL (ref 210–475)

## 2016-06-28 LAB — GLUCOSE, CAPILLARY: Glucose-Capillary: 126 mg/dL — ABNORMAL HIGH (ref 65–99)

## 2016-06-28 MED ORDER — FLUDEOXYGLUCOSE F - 18 (FDG) INJECTION
12.5000 | Freq: Once | INTRAVENOUS | Status: AC | PRN
  Administered 2016-06-28: 12.97 via INTRAVENOUS

## 2016-07-02 ENCOUNTER — Inpatient Hospital Stay: Payer: Medicare Other

## 2016-07-02 ENCOUNTER — Inpatient Hospital Stay (HOSPITAL_BASED_OUTPATIENT_CLINIC_OR_DEPARTMENT_OTHER): Payer: Medicare Other | Admitting: Internal Medicine

## 2016-07-02 VITALS — BP 153/79 | HR 85 | Temp 97.6°F | Resp 18 | Ht 61.0 in | Wt 163.0 lb

## 2016-07-02 DIAGNOSIS — E785 Hyperlipidemia, unspecified: Secondary | ICD-10-CM

## 2016-07-02 DIAGNOSIS — Z170 Estrogen receptor positive status [ER+]: Principal | ICD-10-CM

## 2016-07-02 DIAGNOSIS — D61818 Other pancytopenia: Secondary | ICD-10-CM

## 2016-07-02 DIAGNOSIS — Z79899 Other long term (current) drug therapy: Secondary | ICD-10-CM

## 2016-07-02 DIAGNOSIS — M818 Other osteoporosis without current pathological fracture: Secondary | ICD-10-CM

## 2016-07-02 DIAGNOSIS — D696 Thrombocytopenia, unspecified: Secondary | ICD-10-CM | POA: Diagnosis not present

## 2016-07-02 DIAGNOSIS — Z85828 Personal history of other malignant neoplasm of skin: Secondary | ICD-10-CM

## 2016-07-02 DIAGNOSIS — R519 Headache, unspecified: Secondary | ICD-10-CM

## 2016-07-02 DIAGNOSIS — C7989 Secondary malignant neoplasm of other specified sites: Secondary | ICD-10-CM | POA: Diagnosis not present

## 2016-07-02 DIAGNOSIS — Z803 Family history of malignant neoplasm of breast: Secondary | ICD-10-CM

## 2016-07-02 DIAGNOSIS — C50811 Malignant neoplasm of overlapping sites of right female breast: Secondary | ICD-10-CM

## 2016-07-02 DIAGNOSIS — Z9221 Personal history of antineoplastic chemotherapy: Secondary | ICD-10-CM

## 2016-07-02 DIAGNOSIS — R51 Headache: Secondary | ICD-10-CM

## 2016-07-02 DIAGNOSIS — R918 Other nonspecific abnormal finding of lung field: Secondary | ICD-10-CM | POA: Diagnosis not present

## 2016-07-02 DIAGNOSIS — Z79811 Long term (current) use of aromatase inhibitors: Secondary | ICD-10-CM | POA: Diagnosis not present

## 2016-07-02 DIAGNOSIS — R200 Anesthesia of skin: Secondary | ICD-10-CM

## 2016-07-02 DIAGNOSIS — F17210 Nicotine dependence, cigarettes, uncomplicated: Secondary | ICD-10-CM

## 2016-07-02 DIAGNOSIS — M1990 Unspecified osteoarthritis, unspecified site: Secondary | ICD-10-CM

## 2016-07-02 DIAGNOSIS — I10 Essential (primary) hypertension: Secondary | ICD-10-CM

## 2016-07-02 DIAGNOSIS — Z923 Personal history of irradiation: Secondary | ICD-10-CM

## 2016-07-02 DIAGNOSIS — F411 Generalized anxiety disorder: Secondary | ICD-10-CM

## 2016-07-02 MED ORDER — ALPRAZOLAM 0.5 MG PO TABS: 0.5000 mg | ORAL_TABLET | Freq: Three times a day (TID) | ORAL | 2 refills | Status: DC | PRN

## 2016-07-02 MED ORDER — LETROZOLE 2.5 MG PO TABS: 2.5000 mg | ORAL_TABLET | Freq: Every day | ORAL | 3 refills | Status: DC

## 2016-07-02 MED ORDER — ABEMACICLIB 150 MG PO TABS: 150.0000 mg | ORAL_TABLET | Freq: Two times a day (BID) | ORAL | 6 refills | Status: DC

## 2016-07-02 NOTE — Progress Notes (Signed)
Patient here for follow-up with Dr. Rogue Bussing. Pt reports insomnia with prednisone use.

## 2016-07-02 NOTE — Progress Notes (Signed)
[facility] OFFICE PROGRESS NOTE  Patient Care Team: Coral Spikes, DO as PCP - General (Family Medicine) [name], DPM as Consulting Physician (Podiatry) [name], MD as Consulting Physician (Internal Medicine) Robert Bellow, MD (General Surgery)   SUMMARY OF ONCOLOGIC HISTORY:  Oncology History   # April 2018- Left chest wall Bx- IMC; ER-PR-POS; her 2 Neu NEG; PET- mild hilar/distal adenopathy; ~1 cm right lung nodule/ several sub-centimeter.  # April 2017-isolated Thrombocytopenia- platelets- 113.  # 2006- RIGHT BREAST CA [pT1cN0M0; STAGE I; ER/PRPos; Her 2 Neu-NEG] s/p FEC x 6 [NSABP B-36]; Femara [Dec 2007-2012]  # Osteoporosis s/p Reclast; BMD- 2015-wnl- ca+vit D        Carcinoma of overlapping sites of right breast in female, estrogen receptor positive ([place])     INTERVAL HISTORY:  A very pleasant 71 year old female patient With above remote history of right breast cancer- is here to review the results of the PET/ In the next plan of care for her newly diagnosed recurrent breast cancer.Today she is accompanied by her daughter/and husband.  Patient continues on prednisone 60 mg a day. She complains of poor tolerance. Complains of headaches. Also complains of numbness around her chin. Otherwise denies any falls.   Patient denies any unusual shortness of breath or chest pain or cough. No weight loss. Appetite is good. No gum bleeding nose bleeds.Denies any new medications.   REVIEW OF SYSTEMS:  A complete 10 point review of system is done which is negative except mentioned above/history of present illness.   PAST MEDICAL HISTORY :  Past Medical History:  Diagnosis Date  . Breast cancer ([place])    right, lumpectomy, radiation, chemo  . Breast cancer ([place])    Right, 2007  . Breast cancer ([place]) 06/20/2016   INVASIVE DUCTAL CARCINOMA.  . Headache   . History of chemotherapy   . History of radiation therapy   . Hyperlipidemia   .  Hypertension   . Osteoarthritis    right hip  . Osteoporosis   . Squamous cell carcinoma    leg, Followed by Dr. Nicole Kindred    PAST SURGICAL HISTORY :   Past Surgical History:  Procedure Laterality Date  . ABDOMINAL HYSTERECTOMY    . BREAST BIOPSY Left 2002   left breast, calcifications  . BREAST BIOPSY Left 06/20/2016   INVASIVE DUCTAL CARCINOMA.  Marland Kitchen BREAST EXCISIONAL BIOPSY Right 2007   positive  . bunion repair    . left breast biopsy    . NASAL SINUS SURGERY    . right hip replacement    . SHOULDER SURGERY    . SQUAMOUS CELL CARCINOMA EXCISION     right leg, Dr. Nicole Kindred  . TOTAL HIP ARTHROPLASTY     right    FAMILY HISTORY :   Family History  Problem Relation Age of Onset  . Heart disease Father 14  . Heart disease Mother   . Hyperlipidemia Mother   . Heart disease Brother   . Diabetes Maternal Grandmother   . Breast cancer Cousin     SOCIAL HISTORY:   Social History  Substance Use Topics  . Smoking status: Current Every Day Smoker    Packs/day: 0.10    Years: 30.00    Types: Cigarettes  . Smokeless tobacco: Never Used  . Alcohol use 0.0 oz/week     Comment: socially    ALLERGIES:  is allergic to fish allergy; morphine sulfate; and oxycodone.  MEDICATIONS:  Current Outpatient Prescriptions  Medication Sig Dispense Refill  . Cholecalciferol (VITAMIN D3) 1000 units CAPS Take by mouth.    . Fexofenadine HCl (ALLEGRA PO) Take 1 capsule by mouth.    . metoprolol succinate (TOPROL-XL) 25 MG 24 hr tablet Take 1 tablet (25 mg total) by mouth daily. 90 tablet 3  . pravastatin (PRAVACHOL) 40 MG tablet Take 1 tablet (40 mg total) by mouth daily. 90 tablet 3  . predniSONE (DELTASONE) 20 MG tablet 3 pills once a day; with breakfast; do not stop until instructed. 50 tablet 0  . Probiotic Product (PROBIOTIC & ACIDOPHILUS EX ST PO) Take by mouth daily.    . Abemaciclib 150 MG TABS Take 150 mg by mouth every 12 (twelve) hours. 60 tablet 6  . ALPRAZolam (XANAX) 0.5 MG  tablet Take 1 tablet (0.5 mg total) by mouth every 8 (eight) hours as needed for anxiety or sleep. 30 tablet 2  . letrozole (FEMARA) 2.5 MG tablet Take 1 tablet (2.5 mg total) by mouth daily. Once a day. 90 tablet 3   No current facility-administered medications for this visit.    Facility-Administered Medications Ordered in Other Visits  Medication Dose Route Frequency Provider Last Rate Last Dose  . gadobenate dimeglumine (MULTIHANCE) injection 15 mL  15 mL Intravenous Once PRN [name], MD        PHYSICAL EXAMINATION: ECOG PERFORMANCE STATUS: 0 - Asymptomatic  BP (!) 153/79   Pulse 85   Temp 97.6 F (36.4 C) (Tympanic)   Resp 18   Ht 5' 1"  (1.549 m)   Wt 163 lb (73.9 kg)   BMI 30.80 kg/m   Filed Weights   07/02/16 1142  Weight: 163 lb (73.9 kg)    GENERAL: Well-nourished well-developed; Alert, no distress and comfortable.  Accompanied by daughter and husband. EYES: WNL.  OROPHARYNX: no thrush or ulceration; good dentition  NECK: supple, no masses felt LYMPH:  no palpable lymphadenopathy in the cervical, axillary or inguinal regions LUNGS: clear to auscultation and  No wheeze or crackles HEART/CVS: regular rate & rhythm and no murmurs; No lower extremity edema ABDOMEN:abdomen soft, non-tender and normal bowel sounds Musculoskeletal:no cyanosis of digits and no clubbing  PSYCH: alert & oriented x 3 with fluent speech NEURO: no focal motor/sensory deficits SKIN:  Bruising noted at the site of the biopsy.   LABORATORY DATA:  I have reviewed the data as listed    Component Value Date/Time   NA 134 (L) 06/28/2016 1117   K 4.3 06/28/2016 1117   CL 100 (L) 06/28/2016 1117   CO2 28 06/28/2016 1117   GLUCOSE 122 (H) 06/28/2016 1117   BUN 24 (H) 06/28/2016 1117   CREATININE 0.92 06/28/2016 1117   CREATININE 0.67 07/09/2014 1321   CREATININE 0.70 04/30/2014 1455   CALCIUM 9.3 06/28/2016 1117   PROT 7.3 06/28/2016 1117   PROT 7.3 07/09/2014 1321   ALBUMIN  4.4 06/28/2016 1117   ALBUMIN 4.3 07/09/2014 1321   AST 21 06/28/2016 1117   AST 19 07/09/2014 1321   ALT 19 06/28/2016 1117   ALT 18 07/09/2014 1321   ALKPHOS 61 06/28/2016 1117   ALKPHOS 66 07/09/2014 1321   BILITOT 0.8 06/28/2016 1117   BILITOT 0.6 07/09/2014 1321   GFRNONAA >60 06/28/2016 1117   GFRNONAA >60 07/09/2014 1321   GFRAA >60 06/28/2016 1117   GFRAA >60 07/09/2014 1321    No results found for: SPEP, UPEP  Lab Results  Component Value Date   WBC 5.8 06/28/2016  NEUTROABS 5.0 06/28/2016   HGB 11.8 (L) 06/28/2016   HCT 34.2 (L) 06/28/2016   MCV 108.1 (H) 06/28/2016   PLT 61 (L) 06/28/2016      Chemistry      Component Value Date/Time   NA 134 (L) 06/28/2016 1117   K 4.3 06/28/2016 1117   CL 100 (L) 06/28/2016 1117   CO2 28 06/28/2016 1117   BUN 24 (H) 06/28/2016 1117   CREATININE 0.92 06/28/2016 1117   CREATININE 0.67 07/09/2014 1321   CREATININE 0.70 04/30/2014 1455      Component Value Date/Time   CALCIUM 9.3 06/28/2016 1117   ALKPHOS 61 06/28/2016 1117   ALKPHOS 66 07/09/2014 1321   AST 21 06/28/2016 1117   AST 19 07/09/2014 1321   ALT 19 06/28/2016 1117   ALT 18 07/09/2014 1321   BILITOT 0.8 06/28/2016 1117   BILITOT 0.6 07/09/2014 1321        ASSESSMENT & PLAN:   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 Left chest wall/upper outer breast nodule biopsy- positive for invasive ductal carcinoma- ER/PR positive HER-2/neu negative breast cancer. Given the PET scan findings- highly suggestive of metastatic disease. To confirm metastatic disease- recommend biopsy of the contralateral right chest wall nodule. This was discussed at the tumor conference/ by Dr. Charissa Bash.  # Discussed with the patient that this is advanced/metastatic breast cancer/incurable however multiple treatment options to slow down the growth of disease.   # Given the borderline blood counts- I  would recommend Femara plus Abemaciclib. Discussed the potential side effects mechanisms of action in detail.  # Pancytopenia with predominantly- thrombocytopenia platelet count of 53. Bone marrow biopsy negative- fish still pending. On prednisone 60 mg improvement of the platelets to 61. However given her intolerance recommend 40 mg a day prednisone.  # Given the headaches/ chin numbness recommend MRI of the brain with contrast ASAP.   # Plan follow-up few days post right chest wall Biopsy.   # I reviewed the blood work- with the patient in detail; also reviewed the imaging independently [as summarized above]; and with the patient in detail.      [name], MD 07/03/2016 2:09 PM

## 2016-07-02 NOTE — Assessment & Plan Note (Addendum)
#  Stage IV recurrent/metastatic. Left chest wall/upper outer breast nodule biopsy- positive for invasive ductal carcinoma- ER/PR positive HER-2/neu negative breast cancer. Given the PET scan findings- highly suggestive of metastatic disease. To confirm metastatic disease- recommend biopsy of the contralateral right chest wall nodule. This was discussed at the tumor conference/ by Dr. Charissa Bash.  # Discussed with the patient that this is advanced/metastatic breast cancer/incurable however multiple treatment options to slow down the growth of disease.   # Given the borderline blood counts- I would recommend Femara plus Abemaciclib. Discussed the potential side effects mechanisms of action in detail.  # Pancytopenia with predominantly- thrombocytopenia platelet count of 53. Bone marrow biopsy negative- fish still pending. On prednisone 60 mg improvement of the platelets to 61. However given her intolerance recommend 40 mg a day prednisone.  # Given the headaches/ chin numbness recommend MRI of the brain with contrast ASAP.   # Plan follow-up few days post right chest wall Biopsy.   # I reviewed the blood work- with the patient in detail; also reviewed the imaging independently [as summarized above]; and with the patient in detail.

## 2016-07-03 ENCOUNTER — Ambulatory Visit
Admission: RE | Admit: 2016-07-03 | Discharge: 2016-07-03 | Disposition: A | Discharge status: Home / Self Care | Payer: Medicare Other | Source: Ambulatory Visit | Attending: Internal Medicine | Admitting: Internal Medicine

## 2016-07-03 ENCOUNTER — Telehealth: Payer: Self-pay | Admitting: *Deleted

## 2016-07-03 DIAGNOSIS — R51 Headache: Secondary | ICD-10-CM | POA: Diagnosis not present

## 2016-07-03 DIAGNOSIS — C50919 Malignant neoplasm of unspecified site of unspecified female breast: Secondary | ICD-10-CM | POA: Diagnosis not present

## 2016-07-03 DIAGNOSIS — C50811 Malignant neoplasm of overlapping sites of right female breast: Secondary | ICD-10-CM

## 2016-07-03 DIAGNOSIS — Z170 Estrogen receptor positive status [ER+]: Secondary | ICD-10-CM | POA: Diagnosis not present

## 2016-07-03 DIAGNOSIS — R519 Headache, unspecified: Secondary | ICD-10-CM

## 2016-07-03 MED ORDER — GADOBENATE DIMEGLUMINE 529 MG/ML IV SOLN
15.0000 mL | Freq: Once | INTRAVENOUS | Status: AC | PRN
  Administered 2016-07-03: 15 mL via INTRAVENOUS

## 2016-07-03 NOTE — Telephone Encounter (Signed)
Mri negative for brain mets. Per md- Plan of care is to cnl fasoldex. Will plan Start pt on femara/abemaciclib. Reviewed insurance card- pt will need to use [facility].   Left msg for patient - rn asked for return phone call to discuss test results and plan of care.

## 2016-07-03 NOTE — Telephone Encounter (Signed)
Patient returned RN's phone call at 1615 today. Discussed the plan of care and MRI brain results with patient. Pt gave verbal understanding of the plan. Discussed receiving the [facility]lib [facility]. She will inform our office of any financial barriers of receiving the medication.  She understands that she may start on the Femara today.  She understands not to start on the Abemaciclib until she notifies our office. She gave verbal understanding of the potential side effects of Abemaciclib and states that she will go to the store today to pick up imodium AD. She will sign her consents for treatment of Abemaciclib on Monday at her next apt with Dr. Rogue Bussing. She thanked me for calling her with the treatment plan.

## 2016-07-04 ENCOUNTER — Ambulatory Visit (INDEPENDENT_AMBULATORY_CARE_PROVIDER_SITE_OTHER): Payer: Medicare Other | Admitting: General Surgery

## 2016-07-04 ENCOUNTER — Inpatient Hospital Stay: Payer: Self-pay

## 2016-07-04 ENCOUNTER — Encounter: Payer: Self-pay | Admitting: General Surgery

## 2016-07-04 VITALS — BP 130/70 | HR 74 | Resp 12 | Ht 61.0 in | Wt 161.0 lb

## 2016-07-04 DIAGNOSIS — C50919 Malignant neoplasm of unspecified site of unspecified female breast: Secondary | ICD-10-CM

## 2016-07-04 DIAGNOSIS — C761 Malignant neoplasm of thorax: Secondary | ICD-10-CM | POA: Diagnosis not present

## 2016-07-04 DIAGNOSIS — C4489 Other specified malignant neoplasm of overlapping sites of skin: Secondary | ICD-10-CM

## 2016-07-04 DIAGNOSIS — C50811 Malignant neoplasm of overlapping sites of right female breast: Secondary | ICD-10-CM

## 2016-07-04 DIAGNOSIS — R222 Localized swelling, mass and lump, trunk: Secondary | ICD-10-CM

## 2016-07-04 NOTE — Patient Instructions (Signed)
CARE AFTER  BIOPSY  1. Leave the dressing on that your doctor applied after the biopsy. It is waterproof. You may bathe, shower and/or swim. The dressing can be removed in 3 days, you will see small strips of tape against your skin on the incision. Do not remove these strips they will gradually fall off in about 2-3 weeks. You may use an ice pack on and off for the first 12-24 hours for comfort.  2. You may want to use a gauze,cloth or similar protection in your bra to prevent rubbing against your dressing and incision. This is not necessary, but you may feel more comfortable doing so.  3. It is recommended that you wear a bra day and night to give support to the breast. This will prevent the weight of the breast from pulling on the incision.  4. Your breast may feel hard and lumpy under the incision. Do not be alarmed. This is the underlying stitching of tissue. Softening of this tissue will occur in time.  5. You may have a follow up appointment or phone follow up in one week after your biopsy. The office phone number is [phone].  6. You will notice about a week or two after your office visit that the strips of the tape on your incision will begin to loosen. These may then be removed.  7. Report to your doctor any of the following:  * Severe pain not relieved by your pain medication  *Redness of the incision  * Drainage from the incision  *Fever greater than 101 degrees

## 2016-07-04 NOTE — Progress Notes (Signed)
Patient ID: Shelly Arias, female   DOB: 1945/05/04, 71 y.o.   MRN: [id number]  Chief Complaint  Patient presents with  . Procedure    right chest wall mass    HPI Shelly Arias is a 71 y.o. female.  Her today for right chest wall biopsy. She did see Dr Rogue Bussing on Monday. She did have a brain MRI yesterday and her PET scan was on 06-28-16.  HPI  Past Medical History:  Diagnosis Date  . Breast cancer ([place])    right, lumpectomy, radiation, chemo  . Breast cancer ([place])    Right, 2007  . Breast cancer ([place]) 06/20/2016   INVASIVE DUCTAL CARCINOMA.  . Headache   . History of chemotherapy   . History of radiation therapy   . Hyperlipidemia   . Hypertension   . Osteoarthritis    right hip  . Osteoporosis   . 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 ABDOMINAL HYSTERECTOMY    . BREAST BIOPSY Left 2002   left breast, calcifications  . BREAST BIOPSY Left 06/20/2016   INVASIVE DUCTAL CARCINOMA.  Marland Kitchen BREAST EXCISIONAL BIOPSY Right 2007   positive  . bunion repair    . left breast biopsy    . NASAL SINUS SURGERY    . right hip replacement    . SHOULDER SURGERY    . SQUAMOUS CELL CARCINOMA EXCISION     right leg, Dr. Nicole Kindred  . TOTAL HIP ARTHROPLASTY     right    Family History  Problem Relation Age of Onset  . Heart disease Father 10  . Heart disease Mother   . Hyperlipidemia Mother   . Heart disease Brother   . Diabetes Maternal Grandmother   . Breast cancer Cousin     Social History Social History  Substance Use Topics  . Smoking status: Current Every Day Smoker    Packs/day: 0.10    Years: 30.00    Types: Cigarettes  . Smokeless tobacco: Never Used  . Alcohol use 0.0 oz/week     Comment: socially    Allergies  Allergen Reactions  . Fish Allergy   . Morphine Sulfate     REACTION: anaphylaxis  . Oxycodone     Nausea and vomiting    Current Outpatient Prescriptions   Medication Sig Dispense Refill  . Abemaciclib 150 MG TABS Take 150 mg by mouth every 12 (twelve) hours. 60 tablet 6  . ALPRAZolam (XANAX) 0.5 MG tablet Take 1 tablet (0.5 mg total) by mouth every 8 (eight) hours as needed for anxiety or sleep. 30 tablet 2  . Cholecalciferol (VITAMIN D3) 1000 units CAPS Take by mouth.    . Fexofenadine HCl (ALLEGRA PO) Take 1 capsule by mouth.    . letrozole (FEMARA) 2.5 MG tablet Take 1 tablet (2.5 mg total) by mouth daily. Once a day. 90 tablet 3  . metoprolol succinate (TOPROL-XL) 25 MG 24 hr tablet Take 1 tablet (25 mg total) by mouth daily. 90 tablet 3  . pravastatin (PRAVACHOL) 40 MG tablet Take 1 tablet (40 mg total) by mouth daily. 90 tablet 3  . predniSONE (DELTASONE) 20 MG tablet 3 pills once a day; with breakfast; do not stop until instructed. 50 tablet 0  . Probiotic Product (PROBIOTIC & ACIDOPHILUS EX ST PO) Take by mouth daily.     No current facility-administered medications for this visit.     Review  of Systems Review of Systems  Constitutional: Negative.   Respiratory: Negative.   Cardiovascular: Negative.     Blood pressure 130/70, pulse 74, resp. rate 12, height '5\' 1"'$  (1.549 m), weight 161 lb (73 kg).  Physical Exam Physical Exam  Constitutional: She is oriented to person, place, and time. She appears well-developed and well-nourished.  Pulmonary/Chest:    Left chest wall bruising from previous biopsy  Neurological: She is alert and oriented to person, place, and time.  Skin: Skin is warm and dry.  Psychiatric: Her behavior is normal.    Data Reviewed MRI of the brain dated 07/03/2016 showed no evidence of metastatic disease.  CA 27-29 of 06/08/2016 was 38.3. 11 months earlier 30.7.  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    Plan    The patient's case was reviewed at the [facility] breast tumor board on 07/02/2016. With the finding of multiple pulmonary nodules as well as 2  cutaneous lesions with similar ultrasound appearance, biopsy of the right chest wall cutaneous nodule was requested to confirm metastatic spread and potentially obviate the need for a pulmonary nodule biopsy.  The patient was amenable to proceed.  Ultrasound examination of the right breast again confirmed a cutaneous nodule outside the breast parenchyma tall are then wide 14 cm from the nipple at 12:00 position measuring 0.45 x 0.48 x 0.60 cm. 10 mL of 0.5% Xylocaine with 0.25% Marcaine with 1-200,000 of epinephrine was utilized well tolerated. ChloraPrep was applied to the skin. An 11 blade was used to make a 1/8 inch skin incision. A 14-gauge Bard biopsy device was utilized and 6 core samples were obtained. Scant bleeding was noted. A biopsy clip was not placed. The skin defect was closed with benzoin, Steri-Strips followed by Telfa and Tegaderm dressing. Icepack provided The patient will be notified when biopsy results are available.     HPI, Physical Exam, Assessment and Plan have been scribed under the direction and in the presence of Robert Bellow, MD.  Karie Fetch, RN  I have completed the exam and reviewed the above documentation for accuracy and completeness.  I agree with the above.  Haematologist has been used and any errors in dictation or transcription are unintentional.  Hervey Ard, M.D., F.A.C.S.  Robert Bellow 07/04/2016, 9:36 AM

## 2016-07-05 ENCOUNTER — Telehealth: Payer: Self-pay | Admitting: *Deleted

## 2016-07-05 ENCOUNTER — Encounter: Payer: Self-pay | Admitting: *Deleted

## 2016-07-05 NOTE — Telephone Encounter (Signed)
Phone call from Dr Orene Desanctis pathology recent right chest wall mass, invasive ductal carcinoma.

## 2016-07-05 NOTE — Telephone Encounter (Signed)
Dr Bary Castilla called the patient.

## 2016-07-06 ENCOUNTER — Other Ambulatory Visit: Payer: Medicare Other

## 2016-07-06 ENCOUNTER — Encounter (HOSPITAL_COMMUNITY): Payer: Self-pay

## 2016-07-06 ENCOUNTER — Ambulatory Visit: Payer: Medicare Other | Admitting: Internal Medicine

## 2016-07-09 ENCOUNTER — Inpatient Hospital Stay (HOSPITAL_BASED_OUTPATIENT_CLINIC_OR_DEPARTMENT_OTHER): Payer: Medicare Other | Admitting: Internal Medicine

## 2016-07-09 ENCOUNTER — Ambulatory Visit: Payer: Medicare Other

## 2016-07-09 ENCOUNTER — Inpatient Hospital Stay: Payer: Medicare Other

## 2016-07-09 VITALS — BP 160/89 | HR 114 | Temp 97.8°F | Resp 18 | Ht 61.0 in | Wt 162.0 lb

## 2016-07-09 DIAGNOSIS — Z79811 Long term (current) use of aromatase inhibitors: Secondary | ICD-10-CM | POA: Diagnosis not present

## 2016-07-09 DIAGNOSIS — C50811 Malignant neoplasm of overlapping sites of right female breast: Secondary | ICD-10-CM

## 2016-07-09 DIAGNOSIS — R918 Other nonspecific abnormal finding of lung field: Secondary | ICD-10-CM

## 2016-07-09 DIAGNOSIS — I10 Essential (primary) hypertension: Secondary | ICD-10-CM | POA: Diagnosis not present

## 2016-07-09 DIAGNOSIS — Z803 Family history of malignant neoplasm of breast: Secondary | ICD-10-CM

## 2016-07-09 DIAGNOSIS — F17210 Nicotine dependence, cigarettes, uncomplicated: Secondary | ICD-10-CM

## 2016-07-09 DIAGNOSIS — R200 Anesthesia of skin: Secondary | ICD-10-CM | POA: Diagnosis not present

## 2016-07-09 DIAGNOSIS — R51 Headache: Secondary | ICD-10-CM | POA: Diagnosis not present

## 2016-07-09 DIAGNOSIS — E785 Hyperlipidemia, unspecified: Secondary | ICD-10-CM

## 2016-07-09 DIAGNOSIS — Z9221 Personal history of antineoplastic chemotherapy: Secondary | ICD-10-CM

## 2016-07-09 DIAGNOSIS — Z170 Estrogen receptor positive status [ER+]: Principal | ICD-10-CM

## 2016-07-09 DIAGNOSIS — D696 Thrombocytopenia, unspecified: Secondary | ICD-10-CM

## 2016-07-09 DIAGNOSIS — C7989 Secondary malignant neoplasm of other specified sites: Secondary | ICD-10-CM | POA: Diagnosis not present

## 2016-07-09 DIAGNOSIS — D61818 Other pancytopenia: Secondary | ICD-10-CM

## 2016-07-09 DIAGNOSIS — M818 Other osteoporosis without current pathological fracture: Secondary | ICD-10-CM

## 2016-07-09 DIAGNOSIS — Z923 Personal history of irradiation: Secondary | ICD-10-CM

## 2016-07-09 DIAGNOSIS — D693 Immune thrombocytopenic purpura: Secondary | ICD-10-CM | POA: Insufficient documentation

## 2016-07-09 DIAGNOSIS — M1990 Unspecified osteoarthritis, unspecified site: Secondary | ICD-10-CM

## 2016-07-09 DIAGNOSIS — Z79899 Other long term (current) drug therapy: Secondary | ICD-10-CM

## 2016-07-09 DIAGNOSIS — Z85828 Personal history of other malignant neoplasm of skin: Secondary | ICD-10-CM

## 2016-07-09 LAB — CBC WITH DIFFERENTIAL/PLATELET
BASOS ABS: 0 10*3/uL (ref 0–0.1)
Basophils Relative: 0 %
EOS ABS: 0 10*3/uL (ref 0–0.7)
EOS PCT: 0 %
HCT: 35.4 % (ref 35.0–47.0)
Hemoglobin: 12.5 g/dL (ref 12.0–16.0)
LYMPHS PCT: 32 %
Lymphs Abs: 2.9 10*3/uL (ref 1.0–3.6)
MCH: 38.4 pg — ABNORMAL HIGH (ref 26.0–34.0)
MCHC: 35.3 g/dL (ref 32.0–36.0)
MCV: 108.8 fL — ABNORMAL HIGH (ref 80.0–100.0)
Monocytes Absolute: 0.7 10*3/uL (ref 0.2–0.9)
Monocytes Relative: 7 %
NEUTROS PCT: 61 %
Neutro Abs: 5.6 10*3/uL (ref 1.4–6.5)
PLATELETS: 58 10*3/uL — AB (ref 150–440)
RBC: 3.25 MIL/uL — AB (ref 3.80–5.20)
RDW: 15.3 % — ABNORMAL HIGH (ref 11.5–14.5)
WBC: 9.2 10*3/uL (ref 3.6–11.0)

## 2016-07-09 LAB — BASIC METABOLIC PANEL
ANION GAP: 8 (ref 5–15)
BUN: 17 mg/dL (ref 6–20)
CO2: 28 mmol/L (ref 22–32)
Calcium: 9.5 mg/dL (ref 8.9–10.3)
Chloride: 95 mmol/L — ABNORMAL LOW (ref 101–111)
Creatinine, Ser: 0.83 mg/dL (ref 0.44–1.00)
GFR calc Af Amer: >60 mL/min (ref >60)
GLUCOSE: 104 mg/dL — AB (ref 65–99)
Potassium: 3.8 mmol/L (ref 3.5–5.1)
SODIUM: 131 mmol/L — AB (ref 135–145)

## 2016-07-09 NOTE — Progress Notes (Signed)
[place] OFFICE PROGRESS NOTE  Patient Care Team: Coral Spikes, DO as PCP - General (Family Medicine) [name], DPM as Consulting Physician (Podiatry) [name], MD as Consulting Physician (Internal Medicine) Robert Bellow, MD (General Surgery)   SUMMARY OF ONCOLOGIC HISTORY:  Oncology History   # April 2018- Left chest wall Bx- IMC; ER-PR-POS; her 2 Neu NEG; PET- mild hilar/distal adenopathy; ~1 cm right lung nodule/ several sub-centimeter.  # April 2017-isolated Thrombocytopenia- platelets- 113.  # 2006- RIGHT BREAST CA [pT1cN0M0; STAGE I; ER/PRPos; Her 2 Neu-NEG] s/p FEC x 6 [NSABP B-36]; Femara [Dec 2007-2012]  # Osteoporosis s/p Reclast; BMD- 2015-wnl- ca+vit D         Carcinoma of overlapping sites of right breast in female, estrogen receptor positive ([place])     INTERVAL HISTORY:  A very pleasant 71 year old female patient With newly diagnosed recurrent breast cancer- is here to review the results of Recent right chest wall biopsy/ In the next plan of care. Today she is accompanied by her husband. Also need to review the MRI of the brain.  Patient was started on prednisone for her low platelets- unfortunately she did not tolerate it well. She is currently on 40 of prednisone. She complains of anxiety chest pressure on prednisone.  Patient denies any unusual shortness of breath or chest pain or cough. No weight loss. Appetite is good. No gum bleeding nose bleeds.Denies any new medications.   REVIEW OF SYSTEMS:  A complete 10 point review of system is done which is negative except mentioned above/history of present illness.   PAST MEDICAL HISTORY :  Past Medical History:  Diagnosis Date  . Breast cancer ([place])    right, lumpectomy, radiation, chemo  . Breast cancer ([place])    Right, 2007  . Breast cancer ([place]) 06/20/2016   INVASIVE DUCTAL CARCINOMA.  . Headache   . History of chemotherapy   . History of radiation therapy   .  Hyperlipidemia   . Hypertension   . Osteoarthritis    right hip  . Osteoporosis   . Squamous cell carcinoma    leg, Followed by Dr. Nicole Kindred    PAST SURGICAL HISTORY :   Past Surgical History:  Procedure Laterality Date  . ABDOMINAL HYSTERECTOMY    . BREAST BIOPSY Left 2002   left breast, calcifications  . BREAST BIOPSY Left 06/20/2016   INVASIVE DUCTAL CARCINOMA.  [facility] Kitchen BREAST EXCISIONAL BIOPSY Right 2007   positive  . bunion repair    . left breast biopsy    . NASAL SINUS SURGERY    . right hip replacement    . SHOULDER SURGERY    . SQUAMOUS CELL CARCINOMA EXCISION     right leg, Dr. Nicole Kindred  . TOTAL HIP ARTHROPLASTY     right    FAMILY HISTORY :   Family History  Problem Relation Age of Onset  . Heart disease Father 53  . Heart disease Mother   . Hyperlipidemia Mother   . Heart disease Brother   . Diabetes Maternal Grandmother   . Breast cancer Cousin     SOCIAL HISTORY:   Social History  Substance Use Topics  . Smoking status: Current Every Day Smoker    Packs/day: 0.10    Years: 30.00    Types: Cigarettes  . Smokeless tobacco: Never Used  . Alcohol use 0.0 oz/week     Comment: socially    ALLERGIES:  is allergic to fish allergy; morphine sulfate; and  oxycodone.  MEDICATIONS:  Current Outpatient Prescriptions  Medication Sig Dispense Refill  . ALPRAZolam (XANAX) 0.5 MG tablet Take 1 tablet (0.5 mg total) by mouth every 8 (eight) hours as needed for anxiety or sleep. 30 tablet 2  . Cholecalciferol (VITAMIN D3) 1000 units CAPS Take by mouth.    . Fexofenadine HCl (ALLEGRA PO) Take 1 capsule by mouth.    . letrozole (FEMARA) 2.5 MG tablet Take 1 tablet (2.5 mg total) by mouth daily. Once a day. 90 tablet 3  . metoprolol succinate (TOPROL-XL) 25 MG 24 hr tablet Take 1 tablet (25 mg total) by mouth daily. 90 tablet 3  . pravastatin (PRAVACHOL) 40 MG tablet Take 1 tablet (40 mg total) by mouth daily. 90 tablet 3  . predniSONE (DELTASONE) 20 MG tablet 3 pills  once a day; with breakfast; do not stop until instructed. 50 tablet 0  . Probiotic Product (PROBIOTIC & ACIDOPHILUS EX ST PO) Take by mouth daily.    . Abemaciclib 150 MG TABS Take 150 mg by mouth every 12 (twelve) hours. (Patient not taking: Reported on 07/09/2016) 60 tablet 6   No current facility-administered medications for this visit.     PHYSICAL EXAMINATION: ECOG PERFORMANCE STATUS: 0 - Asymptomatic  BP (!) 160/89 (Patient Position: Sitting)   Pulse (!) 114   Temp 97.8 F (36.6 C) (Tympanic)   Resp 18   Ht 5' 1"  (1.549 m)   Wt 162 lb (73.5 kg)   BMI 30.61 kg/m   Filed Weights   07/09/16 1129  Weight: 162 lb (73.5 kg)    GENERAL: Well-nourished well-developed; Alert, no distress and comfortable.  Accompanied by Her husband. EYES: WNL.  OROPHARYNX: no thrush or ulceration; good dentition  NECK: supple, no masses felt LYMPH:  no palpable lymphadenopathy in the cervical, axillary or inguinal regions LUNGS: clear to auscultation and  No wheeze or crackles HEART/CVS: regular rate & rhythm and no murmurs; No lower extremity edema ABDOMEN:abdomen soft, non-tender and normal bowel sounds Musculoskeletal:no cyanosis of digits and no clubbing  PSYCH: alert & oriented x 3 with fluent speech NEURO: no focal motor/sensory deficits SKIN:  Bruising noted at the site of the biopsy.   LABORATORY DATA:  I have reviewed the data as listed    Component Value Date/Time   NA 131 (L) 07/09/2016 1106   K 3.8 07/09/2016 1106   CL 95 (L) 07/09/2016 1106   CO2 28 07/09/2016 1106   GLUCOSE 104 (H) 07/09/2016 1106   BUN 17 07/09/2016 1106   CREATININE 0.83 07/09/2016 1106   CREATININE 0.67 07/09/2014 1321   CREATININE 0.70 04/30/2014 1455   CALCIUM 9.5 07/09/2016 1106   PROT 7.3 06/28/2016 1117   PROT 7.3 07/09/2014 1321   ALBUMIN 4.4 06/28/2016 1117   ALBUMIN 4.3 07/09/2014 1321   AST 21 06/28/2016 1117   AST 19 07/09/2014 1321   ALT 19 06/28/2016 1117   ALT 18 07/09/2014 1321    ALKPHOS 61 06/28/2016 1117   ALKPHOS 66 07/09/2014 1321   BILITOT 0.8 06/28/2016 1117   BILITOT 0.6 07/09/2014 1321   GFRNONAA >60 07/09/2016 1106   GFRNONAA >60 07/09/2014 1321   GFRAA >60 07/09/2016 1106   GFRAA >60 07/09/2014 1321    No results found for: SPEP, UPEP  Lab Results  Component Value Date   WBC 9.2 07/09/2016   NEUTROABS 5.6 07/09/2016   HGB 12.5 07/09/2016   HCT 35.4 07/09/2016   MCV 108.8 (H) 07/09/2016   PLT 58 (L)  07/09/2016      Chemistry      Component Value Date/Time   NA 131 (L) 07/09/2016 1106   K 3.8 07/09/2016 1106   CL 95 (L) 07/09/2016 1106   CO2 28 07/09/2016 1106   BUN 17 07/09/2016 1106   CREATININE 0.83 07/09/2016 1106   CREATININE 0.67 07/09/2014 1321   CREATININE 0.70 04/30/2014 1455      Component Value Date/Time   CALCIUM 9.5 07/09/2016 1106   ALKPHOS 61 06/28/2016 1117   ALKPHOS 66 07/09/2014 1321   AST 21 06/28/2016 1117   AST 19 07/09/2014 1321   ALT 19 06/28/2016 1117   ALT 18 07/09/2014 1321   BILITOT 0.8 06/28/2016 1117   BILITOT 0.6 07/09/2014 1321        ASSESSMENT & PLAN:   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 Left chest wall/upper outer breast nodule biopsy- positive for invasive ductal carcinoma- ER/PR positive HER-2/neu negative breast cancer. Bx of right chest wall- positive for [facility]; ER/PR-pos; Her-2 neu-NEG. MRI brain-NEG.   # Given the borderline blood counts- I would recommend Femara plus Abemaciclib. started Femara. Discussed the potential side effects of CDK inhibitor. Discussed that the treatments are palliative- however expect to have a good response from the treatments.  # Thrombocytopenia platelet count of 53; not intolerance to prednisone [stop prednisone 66m x 3 days]; bone marrow biopsy- no obvious cause of thrombocytopenia noted.? 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    # I spoke to patient's son  [vet] on the phone; multiple appropriate questions were asked. Answered to their satisfaction.  # Follow-up approximately 3 weeks labs.      GoCammie SickleMD 07/11/2016 6:55 PM

## 2016-07-09 NOTE — Patient Instructions (Signed)
Abemaciclib tablets  What is this medicine?  ABEMACICLIB (a BEM a SYE klib) is a medicine that targets proteins in cancer cells and stops the cancer cells from growing. It is used to treat breast cancer.  This medicine may be used for other purposes; ask your health care provider or pharmacist if you have questions.  COMMON BRAND NAME(S): VERZENIO  What should I tell my health care provider before I take this medicine?  They need to know if you have any of these conditions:  -diarrhea  -history of blood clots  -infection (especially a virus infection such as chickenpox, cold sores, or herpes)  -liver disease  -low blood counts, like low white cell, platelet, or red cell counts  -an unusual or allergic reaction to abemaciclib, other medicines, foods, dyes, or preservatives  -pregnant or trying to get pregnant  -breast-feeding  How should I use this medicine?  Take this medicine by mouth with a glass of water. Follow the directions on the prescription label. Do not cut, crush or chew this medicine. Do not take with grapefruit juice. Take your medicine at regular intervals. Do not take it more often than directed. Do not stop taking except on your doctor's advice.  Talk to your pediatrician regarding the use of this medicine in children. Special care may be needed.  Overdosage: If you think you have taken too much of this medicine contact a poison control center or emergency room at once.  NOTE: This medicine is only for you. Do not share this medicine with others.  What if I miss a dose?  If you miss a dose or vomit after taking a dose, do not take another dose. Take your next dose at your regular time.  What may interact with this medicine?  This medicine may interact with the following medications:  -clarithromycin  -itraconazole  -ketoconazole  -grapefruit juice  -rifampin  This list may not describe all possible interactions. Give your health  care provider a list of all the medicines, herbs, non-prescription drugs, or dietary supplements you use. Also tell them if you smoke, drink alcohol, or use illegal drugs. Some items may interact with your medicine.  What should I watch for while using this medicine?  Tell your doctor or healthcare professional if your symptoms do not start to get better or if they get worse.  You may need blood work done while you are taking this medicine.  Your mouth may get dry. Chewing sugarless gum or sucking hard candy, and drinking plenty of water may help. Contact your doctor if the problem does not go away or is severe.  Do not become pregnant while taking this medicine or for at least 3 weeks after stopping it. Women should inform their doctor if they wish to become pregnant or think they might be pregnant. There is a potential for serious side effects to an unborn child. Men should inform their doctors if they wish to father a child later. This medicine may lower sperm counts. Talk to your health care professional or pharmacist for more information. Do not breast-feed an infant while taking this medicine or for 3 weeks after the last dose.  Avoid taking products that contain aspirin, acetaminophen, ibuprofen, naproxen, or ketoprofen unless instructed by your doctor. These medicines may hide a fever.  Call your doctor or health care professional for advice if you get a fever, chills or sore throat, or other symptoms of a cold or flu. Do not treat yourself. This drug decreases   your body's ability to fight infections. Try to avoid being around people who are sick.  This medicine may increase your risk to bruise or bleed. Call your doctor or health care professional if you notice any unusual bleeding.  This drug may make you feel generally unwell. This is not uncommon, as chemotherapy can affect healthy cells as well as cancer cells. Report any side effects. Continue your course of treatment even though you feel ill  unless your doctor tells you to stop.  What side effects may I notice from receiving this medicine?  Side effects that you should report to your doctor or health care professional as soon as possible:  -allergic reactions like skin rash, itching or hives, swelling of the face, lips, or tongue  -diarrhea  -low blood counts - this medicine may decrease the number of white blood cells, red blood cells and platelets. You may be at increased risk for infections and bleeding.  -signs and symptoms of a blood clot such as breathing problems; changes in vision; chest pain; severe, sudden headache; pain, swelling, warmth in the leg; trouble speaking; sudden numbness or weakness of the face, arm or leg  -signs and symptoms of infection like fever or chills; cough; sore throat; pain or trouble passing urine  -signs and symptoms of liver injury like dark yellow or brown urine; general ill feeling or flu-like symptoms; light-colored stools; loss of appetite; nausea; right upper belly pain; unusually weak or tired; yellowing of the eyes or skin  Side effects that usually do not require medical attention (report these to your doctor or health care professional if they continue or are bothersome):  -changes in taste  -constipation  -cough  -dizziness  -dry mouth  -headache  -loss of appetite  -mouth sores  -nausea, vomiting  -red spots on the skin  -stomach pain  -swelling of the ankles, feet, hands  -unusually weak or tired  -weight loss  This list may not describe all possible side effects. Call your doctor for medical advice about side effects. You may report side effects to FDA at 1-800-FDA-1088.  Where should I keep my medicine?  Keep out of the reach of children.  Store between 20 and 25 degrees C (68 and 77 degrees F). Throw away any unused medicine after the expiration date.  NOTE: This sheet is a summary. It may not cover all possible information. If you have questions about this medicine, talk to your doctor,  pharmacist, or health care provider.  © 2018 [employer] (2015-12-09 16:59:17)

## 2016-07-09 NOTE — Assessment & Plan Note (Addendum)
#  Stage IV recurrent/metastatic. Left chest wall/upper outer breast nodule biopsy- positive for invasive ductal carcinoma- ER/PR positive HER-2/neu negative breast cancer. Bx of right chest wall- positive for [facility]; ER/PR-pos; Her-2 neu-NEG. MRI brain-NEG.   # Given the borderline blood counts- I would recommend Femara plus Abemaciclib. started Femara. Discussed the potential side effects of CDK inhibitor. Discussed that the treatments are palliative- however expect to have a good response from the treatments.  # Thrombocytopenia platelet count of 53; not intolerance to prednisone [stop prednisone '20mg'$  x 3 days]; bone marrow biopsy- no obvious cause of thrombocytopenia noted.? Acute ITP- recommend IVIG '400mg'$ /kg/day x 5 days.    # I spoke to patient's son [vet] on the phone; multiple appropriate questions were asked. Answered to their satisfaction.  # Follow-up approximately 3 weeks labs.

## 2016-07-11 ENCOUNTER — Inpatient Hospital Stay: Payer: Medicare Other | Attending: Internal Medicine

## 2016-07-11 VITALS — BP 149/78 | HR 96 | Temp 97.6°F | Resp 20

## 2016-07-11 DIAGNOSIS — D61818 Other pancytopenia: Secondary | ICD-10-CM | POA: Insufficient documentation

## 2016-07-11 DIAGNOSIS — F17210 Nicotine dependence, cigarettes, uncomplicated: Secondary | ICD-10-CM | POA: Diagnosis not present

## 2016-07-11 DIAGNOSIS — R040 Epistaxis: Secondary | ICD-10-CM | POA: Insufficient documentation

## 2016-07-11 DIAGNOSIS — M1990 Unspecified osteoarthritis, unspecified site: Secondary | ICD-10-CM | POA: Insufficient documentation

## 2016-07-11 DIAGNOSIS — R918 Other nonspecific abnormal finding of lung field: Secondary | ICD-10-CM | POA: Insufficient documentation

## 2016-07-11 DIAGNOSIS — R233 Spontaneous ecchymoses: Secondary | ICD-10-CM | POA: Diagnosis not present

## 2016-07-11 DIAGNOSIS — M810 Age-related osteoporosis without current pathological fracture: Secondary | ICD-10-CM | POA: Insufficient documentation

## 2016-07-11 DIAGNOSIS — Z7952 Long term (current) use of systemic steroids: Secondary | ICD-10-CM | POA: Diagnosis not present

## 2016-07-11 DIAGNOSIS — D693 Immune thrombocytopenic purpura: Secondary | ICD-10-CM | POA: Diagnosis not present

## 2016-07-11 DIAGNOSIS — Z923 Personal history of irradiation: Secondary | ICD-10-CM | POA: Diagnosis not present

## 2016-07-11 DIAGNOSIS — I10 Essential (primary) hypertension: Secondary | ICD-10-CM | POA: Insufficient documentation

## 2016-07-11 DIAGNOSIS — Z9221 Personal history of antineoplastic chemotherapy: Secondary | ICD-10-CM | POA: Insufficient documentation

## 2016-07-11 DIAGNOSIS — Z170 Estrogen receptor positive status [ER+]: Secondary | ICD-10-CM | POA: Diagnosis not present

## 2016-07-11 DIAGNOSIS — Z85828 Personal history of other malignant neoplasm of skin: Secondary | ICD-10-CM | POA: Diagnosis not present

## 2016-07-11 DIAGNOSIS — Z79899 Other long term (current) drug therapy: Secondary | ICD-10-CM | POA: Insufficient documentation

## 2016-07-11 DIAGNOSIS — Z803 Family history of malignant neoplasm of breast: Secondary | ICD-10-CM | POA: Insufficient documentation

## 2016-07-11 DIAGNOSIS — E785 Hyperlipidemia, unspecified: Secondary | ICD-10-CM | POA: Diagnosis not present

## 2016-07-11 DIAGNOSIS — C50811 Malignant neoplasm of overlapping sites of right female breast: Secondary | ICD-10-CM | POA: Diagnosis not present

## 2016-07-11 DIAGNOSIS — Z79811 Long term (current) use of aromatase inhibitors: Secondary | ICD-10-CM | POA: Diagnosis not present

## 2016-07-11 MED ORDER — DIPHENHYDRAMINE HCL 25 MG PO TABS
25.0000 mg | ORAL_TABLET | Freq: Once | ORAL | Status: DC
  Filled 2016-07-11: qty 1

## 2016-07-11 MED ORDER — ACETAMINOPHEN 325 MG PO TABS
650.0000 mg | ORAL_TABLET | Freq: Once | ORAL | Status: AC
  Administered 2016-07-11: 650 mg via ORAL
  Filled 2016-07-11: qty 2

## 2016-07-11 MED ORDER — SODIUM CHLORIDE 0.9 % IV SOLN
Freq: Once | INTRAVENOUS | Status: AC
  Administered 2016-07-11: 10:00:00 via INTRAVENOUS
  Filled 2016-07-11: qty 1000

## 2016-07-11 MED ORDER — IMMUNE GLOBULIN (HUMAN) 20 GM/200ML IV SOLN
400.0000 mg/kg | Freq: Once | INTRAVENOUS | Status: AC
  Administered 2016-07-11: 11:00:00 30 g via INTRAVENOUS
  Filled 2016-07-11: qty 100

## 2016-07-11 MED ORDER — DIPHENHYDRAMINE HCL 25 MG PO CAPS
25.0000 mg | ORAL_CAPSULE | Freq: Once | ORAL | Status: AC
  Administered 2016-07-11: 25 mg via ORAL
  Filled 2016-07-11: qty 1

## 2016-07-11 NOTE — Progress Notes (Signed)
Patient information sheet on IVIG given to pt. Discussed with pt and family member, no questions.

## 2016-07-12 ENCOUNTER — Other Ambulatory Visit: Payer: Self-pay | Admitting: *Deleted

## 2016-07-12 ENCOUNTER — Inpatient Hospital Stay: Payer: Medicare Other

## 2016-07-12 VITALS — BP 124/76 | HR 97 | Temp 98.4°F | Resp 20

## 2016-07-12 DIAGNOSIS — Z79811 Long term (current) use of aromatase inhibitors: Secondary | ICD-10-CM | POA: Diagnosis not present

## 2016-07-12 DIAGNOSIS — D693 Immune thrombocytopenic purpura: Secondary | ICD-10-CM

## 2016-07-12 DIAGNOSIS — Z170 Estrogen receptor positive status [ER+]: Secondary | ICD-10-CM

## 2016-07-12 DIAGNOSIS — C50811 Malignant neoplasm of overlapping sites of right female breast: Secondary | ICD-10-CM | POA: Diagnosis not present

## 2016-07-12 DIAGNOSIS — Z79899 Other long term (current) drug therapy: Secondary | ICD-10-CM | POA: Diagnosis not present

## 2016-07-12 DIAGNOSIS — D61818 Other pancytopenia: Secondary | ICD-10-CM | POA: Diagnosis not present

## 2016-07-12 LAB — MAGNESIUM: Magnesium: 1.8 mg/dL (ref 1.7–2.4)

## 2016-07-12 MED ORDER — DIPHENHYDRAMINE HCL 25 MG PO CAPS
25.0000 mg | ORAL_CAPSULE | Freq: Once | ORAL | Status: AC
  Administered 2016-07-12: 25 mg via ORAL
  Filled 2016-07-12: qty 1

## 2016-07-12 MED ORDER — ACETAMINOPHEN 325 MG PO TABS
650.0000 mg | ORAL_TABLET | Freq: Once | ORAL | Status: AC
  Administered 2016-07-12: 650 mg via ORAL
  Filled 2016-07-12: qty 2

## 2016-07-12 MED ORDER — SODIUM CHLORIDE 0.9 % IV SOLN
Freq: Once | INTRAVENOUS | Status: AC
  Administered 2016-07-12: 09:00:00 via INTRAVENOUS
  Filled 2016-07-12: qty 1000

## 2016-07-12 MED ORDER — DIPHENHYDRAMINE HCL 25 MG PO TABS
25.0000 mg | ORAL_TABLET | Freq: Once | ORAL | Status: DC
Start: 2016-07-12 — End: 2016-07-12

## 2016-07-12 MED ORDER — IMMUNE GLOBULIN (HUMAN) 20 GM/200ML IV SOLN
400.0000 mg/kg | Freq: Once | INTRAVENOUS | Status: AC
  Administered 2016-07-12: 10:00:00 30 g via INTRAVENOUS
  Filled 2016-07-12: qty 100

## 2016-07-12 NOTE — Progress Notes (Signed)
Per v/o md- added cbc, metb to pt's schedule tomorrow. Per md- pt reports muscle cramps x 2 nights.

## 2016-07-12 NOTE — Progress Notes (Signed)
Spoke with patient while in infusion. Pt states she received her shipment of abemaciclib today.  She states that she had a zero dollar copay for both the abemaciclib and femara. Pt asked to review potential side effects of abemaciclib and interactions with RN. Pt also wanted to verify that she is to take the abemaciclib 150 mg by mouth every 12 (twelve) hours with or without food. She gave verbal understanding to take the tablet at the same time each day.  She has picked up a box of imodium AD. Patient education reinforced. Pt gave verbal understanding of drug side effects, interactions with grapefruit juice and dosing schedule.  Teach back process performed.  Pt consent obtained for the abemaciclib. She states that she will start on the abemaciclib tomorrow morning.  Pt understands to contact our office should she have any additional concerns.

## 2016-07-13 ENCOUNTER — Inpatient Hospital Stay: Payer: Medicare Other

## 2016-07-13 VITALS — BP 123/78 | HR 116 | Temp 98.6°F | Resp 20

## 2016-07-13 DIAGNOSIS — D61818 Other pancytopenia: Secondary | ICD-10-CM | POA: Diagnosis not present

## 2016-07-13 DIAGNOSIS — D693 Immune thrombocytopenic purpura: Secondary | ICD-10-CM

## 2016-07-13 DIAGNOSIS — Z79811 Long term (current) use of aromatase inhibitors: Secondary | ICD-10-CM | POA: Diagnosis not present

## 2016-07-13 DIAGNOSIS — C50811 Malignant neoplasm of overlapping sites of right female breast: Secondary | ICD-10-CM | POA: Diagnosis not present

## 2016-07-13 DIAGNOSIS — Z170 Estrogen receptor positive status [ER+]: Secondary | ICD-10-CM

## 2016-07-13 DIAGNOSIS — Z79899 Other long term (current) drug therapy: Secondary | ICD-10-CM | POA: Diagnosis not present

## 2016-07-13 LAB — BASIC METABOLIC PANEL
Anion gap: 5 (ref 5–15)
BUN: 10 mg/dL (ref 6–20)
CALCIUM: 8.9 mg/dL (ref 8.9–10.3)
CO2: 28 mmol/L (ref 22–32)
Chloride: 97 mmol/L — ABNORMAL LOW (ref 101–111)
Creatinine, Ser: 0.74 mg/dL (ref 0.44–1.00)
GFR calc Af Amer: >60 mL/min (ref >60)
GLUCOSE: 99 mg/dL (ref 65–99)
POTASSIUM: 4 mmol/L (ref 3.5–5.1)
Sodium: 130 mmol/L — ABNORMAL LOW (ref 135–145)

## 2016-07-13 LAB — CBC WITH DIFFERENTIAL/PLATELET
BASOS PCT: 1 %
Basophils Absolute: 0 10*3/uL (ref 0–0.1)
EOS ABS: 0 10*3/uL (ref 0–0.7)
EOS PCT: 1 %
HCT: 32.8 % — ABNORMAL LOW (ref 35.0–47.0)
Hemoglobin: 11.6 g/dL — ABNORMAL LOW (ref 12.0–16.0)
Lymphocytes Relative: 24 %
Lymphs Abs: 1.3 10*3/uL (ref 1.0–3.6)
MCH: 38.7 pg — ABNORMAL HIGH (ref 26.0–34.0)
MCHC: 35.4 g/dL (ref 32.0–36.0)
MCV: 109.3 fL — ABNORMAL HIGH (ref 80.0–100.0)
MONO ABS: 0.3 10*3/uL (ref 0.2–0.9)
MONOS PCT: 5 %
Neutro Abs: 3.7 10*3/uL (ref 1.4–6.5)
Neutrophils Relative %: 69 %
PLATELETS: 56 10*3/uL — AB (ref 150–440)
RBC: 3 MIL/uL — ABNORMAL LOW (ref 3.80–5.20)
RDW: 15.6 % — AB (ref 11.5–14.5)
WBC: 5.3 10*3/uL (ref 3.6–11.0)

## 2016-07-13 MED ORDER — DIPHENHYDRAMINE HCL 25 MG PO CAPS
25.0000 mg | ORAL_CAPSULE | Freq: Once | ORAL | Status: AC
  Administered 2016-07-13: 25 mg via ORAL
  Filled 2016-07-13: qty 1

## 2016-07-13 MED ORDER — SODIUM CHLORIDE 0.9 % IV SOLN
Freq: Once | INTRAVENOUS | Status: AC
  Administered 2016-07-13: 10:00:00 via INTRAVENOUS
  Filled 2016-07-13: qty 1000

## 2016-07-13 MED ORDER — DIPHENHYDRAMINE HCL 25 MG PO TABS
25.0000 mg | ORAL_TABLET | Freq: Once | ORAL | Status: DC
Start: 2016-07-13 — End: 2016-07-13

## 2016-07-13 MED ORDER — ACETAMINOPHEN 325 MG PO TABS
650.0000 mg | ORAL_TABLET | Freq: Once | ORAL | Status: AC
  Administered 2016-07-13: 650 mg via ORAL
  Filled 2016-07-13: qty 2

## 2016-07-13 MED ORDER — IMMUNE GLOBULIN (HUMAN) 20 GM/200ML IV SOLN
400.0000 mg/kg | Freq: Once | INTRAVENOUS | Status: AC
  Administered 2016-07-13: 10:00:00 30 g via INTRAVENOUS
  Filled 2016-07-13: qty 100

## 2016-07-16 ENCOUNTER — Encounter: Payer: Self-pay | Admitting: *Deleted

## 2016-07-16 ENCOUNTER — Inpatient Hospital Stay: Payer: Medicare Other

## 2016-07-16 ENCOUNTER — Telehealth: Payer: Self-pay | Admitting: Family Medicine

## 2016-07-16 ENCOUNTER — Telehealth: Payer: Self-pay | Admitting: Internal Medicine

## 2016-07-16 ENCOUNTER — Other Ambulatory Visit: Payer: Self-pay | Admitting: *Deleted

## 2016-07-16 VITALS — BP 133/84 | HR 108 | Temp 96.0°F | Resp 18

## 2016-07-16 DIAGNOSIS — Z79811 Long term (current) use of aromatase inhibitors: Secondary | ICD-10-CM | POA: Diagnosis not present

## 2016-07-16 DIAGNOSIS — C50811 Malignant neoplasm of overlapping sites of right female breast: Secondary | ICD-10-CM

## 2016-07-16 DIAGNOSIS — Z170 Estrogen receptor positive status [ER+]: Secondary | ICD-10-CM

## 2016-07-16 DIAGNOSIS — D693 Immune thrombocytopenic purpura: Secondary | ICD-10-CM | POA: Diagnosis not present

## 2016-07-16 DIAGNOSIS — R040 Epistaxis: Secondary | ICD-10-CM

## 2016-07-16 DIAGNOSIS — D61818 Other pancytopenia: Secondary | ICD-10-CM | POA: Diagnosis not present

## 2016-07-16 DIAGNOSIS — Z79899 Other long term (current) drug therapy: Secondary | ICD-10-CM | POA: Diagnosis not present

## 2016-07-16 LAB — CBC WITH DIFFERENTIAL/PLATELET
BASOS ABS: 0 10*3/uL (ref 0–0.1)
Basophils Relative: 1 %
EOS ABS: 0 10*3/uL (ref 0–0.7)
EOS PCT: 1 %
HCT: 29.6 % — ABNORMAL LOW (ref 35.0–47.0)
Hemoglobin: 10.4 g/dL — ABNORMAL LOW (ref 12.0–16.0)
Lymphocytes Relative: 31 %
Lymphs Abs: 1 10*3/uL (ref 1.0–3.6)
MCH: 38.6 pg — ABNORMAL HIGH (ref 26.0–34.0)
MCHC: 35.2 g/dL (ref 32.0–36.0)
MCV: 109.7 fL — ABNORMAL HIGH (ref 80.0–100.0)
MONO ABS: 0.2 10*3/uL (ref 0.2–0.9)
Monocytes Relative: 6 %
Neutro Abs: 2 10*3/uL (ref 1.4–6.5)
Neutrophils Relative %: 61 %
PLATELETS: 35 10*3/uL — AB (ref 150–440)
RBC: 2.7 MIL/uL — AB (ref 3.80–5.20)
RDW: 15.9 % — AB (ref 11.5–14.5)
WBC: 3.3 10*3/uL — AB (ref 3.6–11.0)

## 2016-07-16 LAB — PROTIME-INR
INR: 0.98
Prothrombin Time: 13 seconds (ref 11.4–15.2)

## 2016-07-16 LAB — APTT

## 2016-07-16 MED ORDER — IMMUNE GLOBULIN (HUMAN) 20 GM/200ML IV SOLN
400.0000 mg/kg | Freq: Once | INTRAVENOUS | Status: AC
  Administered 2016-07-16: 10:00:00 30 g via INTRAVENOUS
  Filled 2016-07-16: qty 100

## 2016-07-16 MED ORDER — DEXTROSE 5 % IV SOLN
Freq: Once | INTRAVENOUS | Status: AC
  Administered 2016-07-16: 09:00:00 via INTRAVENOUS
  Filled 2016-07-16: qty 1000

## 2016-07-16 MED ORDER — ACETAMINOPHEN 325 MG PO TABS
650.0000 mg | ORAL_TABLET | Freq: Once | ORAL | Status: AC
  Administered 2016-07-16: 650 mg via ORAL
  Filled 2016-07-16: qty 2

## 2016-07-16 MED ORDER — SODIUM CHLORIDE 0.9 % IV SOLN: Freq: Once | INTRAVENOUS | Status: DC

## 2016-07-16 MED ORDER — DIPHENHYDRAMINE HCL 25 MG PO CAPS
25.0000 mg | ORAL_CAPSULE | Freq: Once | ORAL | Status: AC
  Administered 2016-07-16: 25 mg via ORAL
  Filled 2016-07-16 (×2): qty 1

## 2016-07-16 NOTE — Telephone Encounter (Signed)
Apt scheduled for 07/19/16 at 1045

## 2016-07-16 NOTE — Telephone Encounter (Signed)
FYI- spoke to pt re: platelets- 35; on IVIG [ 4th today]; next tomorrow; check cbc- 5/10; HOLD off ambemaciclib [started on5/4]. Will await the labs on 5/10.

## 2016-07-16 NOTE — Progress Notes (Signed)
Patient states, "I had a nose bleed from my left nostril on Saturday. After I packed it, it took about 10 minutes for it to stop." MD, Dr. Rogue Bussing, notified via telephone. MD to place orders for lab work to be drawn today. Proceed with treatment as scheduled.

## 2016-07-16 NOTE — Telephone Encounter (Signed)
FYI - Pt called and cancelled her 5/16 appointment. Pt has stage 4 breast cancer and has so much going on and needed to take something off her plate. Pt wanted to make sure that Dr. Lacinda Axon is getting all her reports.

## 2016-07-17 ENCOUNTER — Inpatient Hospital Stay: Payer: Medicare Other

## 2016-07-17 VITALS — BP 143/89 | HR 123 | Temp 98.5°F | Resp 18

## 2016-07-17 DIAGNOSIS — Z79811 Long term (current) use of aromatase inhibitors: Secondary | ICD-10-CM | POA: Diagnosis not present

## 2016-07-17 DIAGNOSIS — Z170 Estrogen receptor positive status [ER+]: Secondary | ICD-10-CM

## 2016-07-17 DIAGNOSIS — D693 Immune thrombocytopenic purpura: Secondary | ICD-10-CM

## 2016-07-17 DIAGNOSIS — Z79899 Other long term (current) drug therapy: Secondary | ICD-10-CM | POA: Diagnosis not present

## 2016-07-17 DIAGNOSIS — D61818 Other pancytopenia: Secondary | ICD-10-CM | POA: Diagnosis not present

## 2016-07-17 DIAGNOSIS — C50811 Malignant neoplasm of overlapping sites of right female breast: Secondary | ICD-10-CM | POA: Diagnosis not present

## 2016-07-17 MED ORDER — ACETAMINOPHEN 325 MG PO TABS
650.0000 mg | ORAL_TABLET | Freq: Once | ORAL | Status: AC
  Administered 2016-07-17: 650 mg via ORAL
  Filled 2016-07-17: qty 2

## 2016-07-17 MED ORDER — DEXTROSE 5 % IV SOLN
INTRAVENOUS | Status: DC
  Administered 2016-07-17: 09:00:00 via INTRAVENOUS
  Filled 2016-07-17: qty 1000

## 2016-07-17 MED ORDER — IMMUNE GLOBULIN (HUMAN) 20 GM/200ML IV SOLN
400.0000 mg/kg | Freq: Once | INTRAVENOUS | Status: AC
  Administered 2016-07-17: 30 g via INTRAVENOUS
  Filled 2016-07-17: qty 100

## 2016-07-17 MED ORDER — SODIUM CHLORIDE 0.9 % IV SOLN
Freq: Once | INTRAVENOUS | Status: DC
  Filled 2016-07-17: qty 1000

## 2016-07-17 MED ORDER — DIPHENHYDRAMINE HCL 25 MG PO TABS
25.0000 mg | ORAL_TABLET | Freq: Once | ORAL | Status: AC
  Administered 2016-07-17: 25 mg via ORAL
  Filled 2016-07-17: qty 1

## 2016-07-19 ENCOUNTER — Encounter: Payer: Self-pay | Admitting: *Deleted

## 2016-07-19 ENCOUNTER — Inpatient Hospital Stay: Payer: Medicare Other

## 2016-07-19 DIAGNOSIS — Z170 Estrogen receptor positive status [ER+]: Principal | ICD-10-CM

## 2016-07-19 DIAGNOSIS — C50811 Malignant neoplasm of overlapping sites of right female breast: Secondary | ICD-10-CM

## 2016-07-19 DIAGNOSIS — D696 Thrombocytopenia, unspecified: Secondary | ICD-10-CM

## 2016-07-19 DIAGNOSIS — R112 Nausea with vomiting, unspecified: Secondary | ICD-10-CM

## 2016-07-19 DIAGNOSIS — D61818 Other pancytopenia: Secondary | ICD-10-CM | POA: Diagnosis not present

## 2016-07-19 DIAGNOSIS — Z79899 Other long term (current) drug therapy: Secondary | ICD-10-CM | POA: Diagnosis not present

## 2016-07-19 DIAGNOSIS — Z79811 Long term (current) use of aromatase inhibitors: Secondary | ICD-10-CM | POA: Diagnosis not present

## 2016-07-19 DIAGNOSIS — D693 Immune thrombocytopenic purpura: Secondary | ICD-10-CM | POA: Diagnosis not present

## 2016-07-19 LAB — CBC WITH DIFFERENTIAL/PLATELET
Basophils Absolute: 0 10*3/uL (ref 0–0.1)
Basophils Relative: 1 %
Eosinophils Absolute: 0 10*3/uL (ref 0–0.7)
Eosinophils Relative: 1 %
HCT: 29.4 % — ABNORMAL LOW (ref 35.0–47.0)
HEMOGLOBIN: 10.4 g/dL — AB (ref 12.0–16.0)
LYMPHS ABS: 0.8 10*3/uL — AB (ref 1.0–3.6)
Lymphocytes Relative: 33 %
MCH: 38.5 pg — AB (ref 26.0–34.0)
MCHC: 35.5 g/dL (ref 32.0–36.0)
MCV: 108.6 fL — AB (ref 80.0–100.0)
Monocytes Absolute: 0.1 10*3/uL — ABNORMAL LOW (ref 0.2–0.9)
Monocytes Relative: 4 %
NEUTROS ABS: 1.6 10*3/uL (ref 1.4–6.5)
Neutrophils Relative %: 61 %
Platelets: 30 10*3/uL — ABNORMAL LOW (ref 150–440)
RBC: 2.71 MIL/uL — AB (ref 3.80–5.20)
RDW: 15.3 % — ABNORMAL HIGH (ref 11.5–14.5)
WBC: 2.5 10*3/uL — ABNORMAL LOW (ref 3.6–11.0)

## 2016-07-19 MED ORDER — PROMETHAZINE HCL 25 MG PO TABS: 25.0000 mg | ORAL_TABLET | Freq: Four times a day (QID) | ORAL | 3 refills | Status: DC | PRN

## 2016-07-19 NOTE — Progress Notes (Signed)
Patient here for lab only today. Patient has a plt count of 30. She denies any sign of nose bleeds or bleeding. She does voice concerns of intermittent nausea. She states that phenergan also worked to help her nausea in the past. Rn spoke with Dr. Rogue Bussing who gave a v/o to send in rx for phenergan 25 mg tablets prn every 6 to 8 hrs prn nausea.  She reports 1 episode in the last 48 hrs of constipation and used laxatives which produced a bm within 12 hrs.  RN discussed continual bleeding precautions with pt and that md would like to check the plt count again on Monday. Pt provided with lab only apt for Monday 07/23/16 at 1115 am.  Patient also instructed to hold her Abemaciclib until further instructed to start back. She gave verbal understanding- teach back process performed.

## 2016-07-20 ENCOUNTER — Other Ambulatory Visit: Payer: Self-pay | Admitting: *Deleted

## 2016-07-20 DIAGNOSIS — Z170 Estrogen receptor positive status [ER+]: Principal | ICD-10-CM

## 2016-07-20 DIAGNOSIS — R112 Nausea with vomiting, unspecified: Secondary | ICD-10-CM

## 2016-07-20 DIAGNOSIS — C50811 Malignant neoplasm of overlapping sites of right female breast: Secondary | ICD-10-CM

## 2016-07-20 MED ORDER — ONDANSETRON HCL 8 MG PO TABS: 8.0000 mg | ORAL_TABLET | Freq: Three times a day (TID) | ORAL | 4 refills | Status: DC | PRN

## 2016-07-23 ENCOUNTER — Other Ambulatory Visit: Payer: Self-pay | Admitting: *Deleted

## 2016-07-23 ENCOUNTER — Other Ambulatory Visit: Payer: Self-pay | Admitting: Internal Medicine

## 2016-07-23 ENCOUNTER — Inpatient Hospital Stay: Payer: Medicare Other

## 2016-07-23 ENCOUNTER — Telehealth: Payer: Self-pay | Admitting: Internal Medicine

## 2016-07-23 DIAGNOSIS — D693 Immune thrombocytopenic purpura: Secondary | ICD-10-CM

## 2016-07-23 DIAGNOSIS — Z170 Estrogen receptor positive status [ER+]: Secondary | ICD-10-CM

## 2016-07-23 DIAGNOSIS — Z79899 Other long term (current) drug therapy: Secondary | ICD-10-CM | POA: Diagnosis not present

## 2016-07-23 DIAGNOSIS — D61818 Other pancytopenia: Secondary | ICD-10-CM | POA: Diagnosis not present

## 2016-07-23 DIAGNOSIS — C50811 Malignant neoplasm of overlapping sites of right female breast: Secondary | ICD-10-CM | POA: Diagnosis not present

## 2016-07-23 DIAGNOSIS — Z79811 Long term (current) use of aromatase inhibitors: Secondary | ICD-10-CM | POA: Diagnosis not present

## 2016-07-23 LAB — CBC WITH DIFFERENTIAL/PLATELET
BASOS PCT: 0 %
Basophils Absolute: 0 10*3/uL (ref 0–0.1)
Eosinophils Absolute: 0 10*3/uL (ref 0–0.7)
Eosinophils Relative: 1 %
HCT: 26.7 % — ABNORMAL LOW (ref 35.0–47.0)
Hemoglobin: 9.6 g/dL — ABNORMAL LOW (ref 12.0–16.0)
LYMPHS ABS: 0.9 10*3/uL — AB (ref 1.0–3.6)
Lymphocytes Relative: 49 %
MCH: 38.6 pg — AB (ref 26.0–34.0)
MCHC: 36 g/dL (ref 32.0–36.0)
MCV: 107.2 fL — ABNORMAL HIGH (ref 80.0–100.0)
MONO ABS: 0.1 10*3/uL — AB (ref 0.2–0.9)
MONOS PCT: 5 %
NEUTROS ABS: 0.8 10*3/uL — AB (ref 1.4–6.5)
Neutrophils Relative %: 45 %
Platelets: 17 10*3/uL — CL (ref 150–400)
RBC: 2.49 MIL/uL — ABNORMAL LOW (ref 3.80–5.20)
RDW: 15.2 % — AB (ref 11.5–14.5)
WBC: 1.8 10*3/uL — ABNORMAL LOW (ref 3.6–11.0)

## 2016-07-23 MED ORDER — ROMIPLOSTIM 250 MCG ~~LOC~~ SOLR
74.0000 ug | Freq: Once | SUBCUTANEOUS | Status: AC
  Administered 2016-07-23: 75 ug via SUBCUTANEOUS
  Filled 2016-07-23: qty 0.15

## 2016-07-23 NOTE — Telephone Encounter (Signed)
Critical plat count of 19 called to Renita Papa, RN from [place] in cancer center lab. 1152 am on 07/23/16 read back process performed with lab tech.  Dr. Rogue Bussing - made aware of critical value. Spoke with patient in clinic today. MD ordering NPLATE. Discussed care plan with patient. She is agreeable.

## 2016-07-23 NOTE — Telephone Encounter (Signed)
Spoke to Dr.Smir; kindly agrees to review Bone marrow slides again [marrow NOT suggestive of MDS; FISH-NEG; no obvious evidence of malignancy/breast cancer in marrow].  # Worsening drop in platelets- ? Acute ITP [no response to IVIG; on PO steroids]; try N-plate.

## 2016-07-25 ENCOUNTER — Ambulatory Visit: Payer: Medicare Other | Admitting: Family Medicine

## 2016-07-26 ENCOUNTER — Other Ambulatory Visit: Payer: Self-pay | Admitting: *Deleted

## 2016-07-26 ENCOUNTER — Telehealth: Payer: Self-pay | Admitting: Internal Medicine

## 2016-07-26 ENCOUNTER — Inpatient Hospital Stay: Payer: Medicare Other

## 2016-07-26 ENCOUNTER — Inpatient Hospital Stay (HOSPITAL_BASED_OUTPATIENT_CLINIC_OR_DEPARTMENT_OTHER): Payer: Medicare Other | Admitting: Internal Medicine

## 2016-07-26 VITALS — BP 162/81 | HR 112 | Temp 97.8°F | Resp 18 | Ht 61.0 in | Wt 158.0 lb

## 2016-07-26 DIAGNOSIS — M1990 Unspecified osteoarthritis, unspecified site: Secondary | ICD-10-CM | POA: Diagnosis not present

## 2016-07-26 DIAGNOSIS — D61818 Other pancytopenia: Secondary | ICD-10-CM | POA: Diagnosis not present

## 2016-07-26 DIAGNOSIS — E785 Hyperlipidemia, unspecified: Secondary | ICD-10-CM

## 2016-07-26 DIAGNOSIS — Z170 Estrogen receptor positive status [ER+]: Secondary | ICD-10-CM

## 2016-07-26 DIAGNOSIS — Z803 Family history of malignant neoplasm of breast: Secondary | ICD-10-CM

## 2016-07-26 DIAGNOSIS — D693 Immune thrombocytopenic purpura: Secondary | ICD-10-CM

## 2016-07-26 DIAGNOSIS — C50811 Malignant neoplasm of overlapping sites of right female breast: Secondary | ICD-10-CM

## 2016-07-26 DIAGNOSIS — F17210 Nicotine dependence, cigarettes, uncomplicated: Secondary | ICD-10-CM

## 2016-07-26 DIAGNOSIS — R040 Epistaxis: Secondary | ICD-10-CM

## 2016-07-26 DIAGNOSIS — Z79811 Long term (current) use of aromatase inhibitors: Secondary | ICD-10-CM

## 2016-07-26 DIAGNOSIS — M810 Age-related osteoporosis without current pathological fracture: Secondary | ICD-10-CM | POA: Diagnosis not present

## 2016-07-26 DIAGNOSIS — Z9221 Personal history of antineoplastic chemotherapy: Secondary | ICD-10-CM

## 2016-07-26 DIAGNOSIS — R918 Other nonspecific abnormal finding of lung field: Secondary | ICD-10-CM | POA: Diagnosis not present

## 2016-07-26 DIAGNOSIS — Z79899 Other long term (current) drug therapy: Secondary | ICD-10-CM

## 2016-07-26 DIAGNOSIS — Z85828 Personal history of other malignant neoplasm of skin: Secondary | ICD-10-CM

## 2016-07-26 DIAGNOSIS — Z923 Personal history of irradiation: Secondary | ICD-10-CM

## 2016-07-26 DIAGNOSIS — I10 Essential (primary) hypertension: Secondary | ICD-10-CM

## 2016-07-26 DIAGNOSIS — Z7952 Long term (current) use of systemic steroids: Secondary | ICD-10-CM

## 2016-07-26 LAB — CBC WITH DIFFERENTIAL/PLATELET
BASOS PCT: 0 %
Basophils Absolute: 0 10*3/uL (ref 0–0.1)
EOS ABS: 0 10*3/uL (ref 0–0.7)
Eosinophils Relative: 1 %
HCT: 25.7 % — ABNORMAL LOW (ref 35.0–47.0)
Hemoglobin: 9.5 g/dL — ABNORMAL LOW (ref 12.0–16.0)
Lymphocytes Relative: 58 %
Lymphs Abs: 0.9 10*3/uL — ABNORMAL LOW (ref 1.0–3.6)
MCH: 38.7 pg — AB (ref 26.0–34.0)
MCHC: 36.7 g/dL — AB (ref 32.0–36.0)
MCV: 105.3 fL — ABNORMAL HIGH (ref 80.0–100.0)
MONO ABS: 0.1 10*3/uL — AB (ref 0.2–0.9)
MONOS PCT: 7 %
NEUTROS PCT: 34 %
Neutro Abs: 0.6 10*3/uL — ABNORMAL LOW (ref 1.4–6.5)
Platelets: 12 10*3/uL — CL (ref 150–400)
RBC: 2.45 MIL/uL — ABNORMAL LOW (ref 3.80–5.20)
RDW: 15.2 % — AB (ref 11.5–14.5)
WBC: 1.6 10*3/uL — ABNORMAL LOW (ref 3.6–11.0)

## 2016-07-26 LAB — URINALYSIS, COMPLETE (UACMP) WITH MICROSCOPIC
BACTERIA UA: NONE SEEN
Bilirubin Urine: NEGATIVE
GLUCOSE, UA: NEGATIVE mg/dL
KETONES UR: NEGATIVE mg/dL
LEUKOCYTES UA: NEGATIVE
NITRITE: NEGATIVE
PH: 6 (ref 5.0–8.0)
PROTEIN: NEGATIVE mg/dL
Specific Gravity, Urine: 1.009 (ref 1.005–1.030)
WBC, UA: NONE SEEN WBC/hpf (ref 0–5)

## 2016-07-26 LAB — TRANSFUSION REACTION
DAT C3: NEGATIVE
Post RXN DAT IgG: NEGATIVE

## 2016-07-26 LAB — ABO/RH: ABO/RH(D): O POS

## 2016-07-26 MED ORDER — HYDROCORTISONE NA SUCCINATE PF 100 MG IJ SOLR: 50.0000 mg | Freq: Once | INTRAMUSCULAR | Status: DC

## 2016-07-26 MED ORDER — DIPHENHYDRAMINE HCL 50 MG/ML IJ SOLN
25.0000 mg | Freq: Once | INTRAMUSCULAR | Status: AC
  Administered 2016-07-26: 25 mg via INTRAVENOUS

## 2016-07-26 MED ORDER — SODIUM CHLORIDE 0.9 % IV SOLN
250.0000 mL | Freq: Once | INTRAVENOUS | Status: AC
  Administered 2016-07-26: 250 mL via INTRAVENOUS
  Filled 2016-07-26: qty 250

## 2016-07-26 MED ORDER — ACETAMINOPHEN 325 MG PO TABS
650.0000 mg | ORAL_TABLET | Freq: Once | ORAL | Status: AC
  Administered 2016-07-26: 650 mg via ORAL
  Filled 2016-07-26: qty 2

## 2016-07-26 MED ORDER — HYDROCORTISONE NA SUCCINATE PF 100 MG IJ SOLR
100.0000 mg | Freq: Once | INTRAMUSCULAR | Status: AC
  Administered 2016-07-26: 100 mg via INTRAVENOUS

## 2016-07-26 MED ORDER — DIPHENHYDRAMINE HCL 25 MG PO CAPS
25.0000 mg | ORAL_CAPSULE | Freq: Once | ORAL | Status: AC
  Administered 2016-07-26: 25 mg via ORAL
  Filled 2016-07-26: qty 1

## 2016-07-26 NOTE — Progress Notes (Signed)
14:55  Patient received 1 unit of platelets and started complaining of face feeling flushed.  Platelets were already completed and IV line was flushing with NS.  Infusion stopped, VS taken, patient monitored.   15:00 Patient stated that she just started itching all over and felt short of breath.  Benadryl and Solu-Cortef given per reaction policy, MD on call, Dr. Mike Gip, present to assess patient.  VS monitored and transfusion reaction protocol started per Dr. Mike Gip.

## 2016-07-26 NOTE — Telephone Encounter (Signed)
Spoke to Dr.Foster; who kindly agrees to evaluate the patient. But would prefer to get bone marow repeated at [facility] prior to his visit. Contact- [phone]/ Belinda Gunn/ [facility] Pita. Will make referral.

## 2016-07-26 NOTE — Progress Notes (Signed)
Patient here for follow-up for acute add-on. Patient c/o nosebleeds. Critical plt count called by Marc Morgans - in cancer ctr lab at  11:08AM 07/26/2016. Prelim. "plt count of 10". Read back process performed with lab tech. Dr. Rogue Bussing made aware of critical plt count at 1115 am. V/o to enter orders for blood group/rh and 1 unit of plts today.  Contacted blood bank- made aware that md is ordering plt infusion today.

## 2016-07-26 NOTE — Telephone Encounter (Signed)
Colette, please call and arrange for apts.

## 2016-07-26 NOTE — Progress Notes (Signed)
[place] OFFICE PROGRESS NOTE  Patient Care Team: Coral Spikes, DO as PCP - General (Family Medicine) [name], DPM as Consulting Physician (Podiatry) [name], MD as Consulting Physician (Internal Medicine) Robert Bellow, MD (General Surgery)   SUMMARY OF ONCOLOGIC HISTORY:  Oncology History   # April 2018- Left chest wall Bx- IMC; ER-PR-POS; her 2 Neu NEG; PET- mild hilar/distal adenopathy; ~1 cm right lung nodule/ several sub-centimeter.   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  After patient platelets did not improve on steroids; she was started on IVIG- without any improvement. She was started on n-plate May 14HO.   Patient continues to have nosebleeds; 2 episodes spontaneous yesterday. Patient denies any unusual shortness of breath or chest pain or cough. No weight loss. Appetite is good.  Denies any new medications.   REVIEW OF SYSTEMS:  A complete 10 point review of system is done which is negative except mentioned above/history of present illness.   PAST MEDICAL HISTORY :  Past Medical History:  Diagnosis Date  . Breast cancer ([place])    right, lumpectomy, radiation, chemo  . Breast cancer  ([place])    Right, 2007  . Breast cancer ([place]) 06/20/2016   INVASIVE DUCTAL CARCINOMA.  . Headache   . History of chemotherapy   . History of radiation therapy   . Hyperlipidemia   . Hypertension   . Osteoarthritis    right hip  . Osteoporosis   . Squamous cell carcinoma    leg, Followed by Dr. Nicole Kindred    PAST SURGICAL HISTORY :   Past Surgical History:  Procedure Laterality Date  . ABDOMINAL HYSTERECTOMY    . BREAST BIOPSY Left 2002   left breast, calcifications  . BREAST BIOPSY Left 06/20/2016   INVASIVE DUCTAL CARCINOMA.  Marland Kitchen BREAST EXCISIONAL BIOPSY Right 2007   positive  . bunion repair    . left breast biopsy    . NASAL SINUS SURGERY    . right hip replacement    . SHOULDER SURGERY    . SQUAMOUS CELL CARCINOMA EXCISION     right leg, Dr. Nicole Kindred  . TOTAL HIP ARTHROPLASTY     right    FAMILY HISTORY :   Family History  Problem Relation Age of Onset  . Heart disease Father 70  . Heart disease Mother   . Hyperlipidemia Mother   . Heart disease Brother   . Diabetes Maternal Grandmother   . Breast cancer Cousin     SOCIAL HISTORY:   Social History  Substance Use Topics  . Smoking status: Current Every Day Smoker    Packs/day: 0.10    Years: 30.00    Types: Cigarettes  . Smokeless tobacco: Never Used  . Alcohol use 0.0 oz/week  Comment: socially    ALLERGIES:  is allergic to fish allergy; morphine sulfate; and oxycodone.  MEDICATIONS:  Current Outpatient Prescriptions  Medication Sig Dispense Refill  . ALPRAZolam (XANAX) 0.5 MG tablet Take 1 tablet (0.5 mg total) by mouth every 8 (eight) hours as needed for anxiety or sleep. 30 tablet 2  . Cholecalciferol (VITAMIN D3) 1000 units CAPS Take by mouth.    . Fexofenadine HCl (ALLEGRA PO) Take 1 capsule by mouth.    . letrozole (FEMARA) 2.5 MG tablet Take 1 tablet (2.5 mg total) by mouth daily. Once a day. 90 tablet 3  . metoprolol succinate (TOPROL-XL) 25 MG 24 hr tablet Take 1 tablet (25 mg total) by  mouth daily. 90 tablet 3  . ondansetron (ZOFRAN) 8 MG tablet Take 1 tablet (8 mg total) by mouth every 8 (eight) hours as needed for nausea or vomiting. 20 tablet 4  . pravastatin (PRAVACHOL) 40 MG tablet Take 1 tablet (40 mg total) by mouth daily. 90 tablet 3  . predniSONE (DELTASONE) 20 MG tablet 3 pills once a day; with breakfast; do not stop until instructed. 50 tablet 0  . Probiotic Product (PROBIOTIC & ACIDOPHILUS EX ST PO) Take by mouth daily.    . promethazine (PHENERGAN) 25 MG tablet Take 1 tablet (25 mg total) by mouth every 6 (six) hours as needed for nausea or vomiting. 30 tablet 3  . Abemaciclib 150 MG TABS Take 150 mg by mouth every 12 (twelve) hours. (Patient not taking: Reported on 07/09/2016) 60 tablet 6   No current facility-administered medications for this visit.     PHYSICAL EXAMINATION: ECOG PERFORMANCE STATUS: 0 - Asymptomatic  BP (!) 162/81 (BP Location: Left Arm, Patient Position: Sitting)   Pulse (!) 112   Temp 97.8 F (36.6 C) (Tympanic)   Resp 18   Ht _0  (1.549 m)   Wt 158 lb (71.7 kg)   BMI 29.85 kg/m   Filed Weights   07/26/16 1156  Weight: 158 lb (71.7 kg)    GENERAL: Well-nourished well-developed; Alert, no distress and comfortable.  Accompanied by Her husband. EYES: WNL.  OROPHARYNX: no thrush or ulceration; good dentition  NECK: supple, no masses felt LYMPH:  no palpable lymphadenopathy in the cervical, axillary or inguinal regions LUNGS: clear to auscultation and  No wheeze or crackles HEART/CVS: regular rate & rhythm and no murmurs; No lower extremity edema ABDOMEN:abdomen soft, non-tender and normal bowel sounds Musculoskeletal:no cyanosis of digits and no clubbing  PSYCH: alert & oriented x 3 with fluent speech NEURO: no focal motor/sensory deficits SKIN:  Bruising noted at the site of the biopsy.   LABORATORY DATA:  I have reviewed the data as listed    Component Value Date/Time   NA 130 (L) 07/13/2016 0857   K 4.0 07/13/2016  0857   CL 97 (L) 07/13/2016 0857   CO2 28 07/13/2016 0857   GLUCOSE 99 07/13/2016 0857   BUN 10 07/13/2016 0857   CREATININE 0.74 07/13/2016 0857   CREATININE 0.67 07/09/2014 1321   CREATININE 0.70 04/30/2014 1455   CALCIUM 8.9 07/13/2016 0857   PROT 7.3 06/28/2016 1117   PROT 7.3 07/09/2014 1321   ALBUMIN 4.4 06/28/2016 1117   ALBUMIN 4.3 07/09/2014 1321   AST 21 06/28/2016 1117   AST 19 07/09/2014 1321   ALT 19 06/28/2016 1117   ALT 18 07/09/2014 1321   ALKPHOS 61 06/28/2016 1117   ALKPHOS 66 07/09/2014 1321   BILITOT 0.8 06/28/2016 1117   BILITOT  0.6 07/09/2014 1321   GFRNONAA >60 07/13/2016 0857   GFRNONAA >60 07/09/2014 1321   GFRAA >60 07/13/2016 0857   GFRAA >60 07/09/2014 1321    No results found for: SPEP, UPEP  Lab Results  Component Value Date   WBC 1.6 (L) 07/26/2016   NEUTROABS 0.6 (L) 07/26/2016   HGB 9.5 (L) 07/26/2016   HCT 25.7 (L) 07/26/2016   MCV 105.3 (H) 07/26/2016   PLT 12 (LL) 07/26/2016      Chemistry      Component Value Date/Time   NA 130 (L) 07/13/2016 0857   K 4.0 07/13/2016 0857   CL 97 (L) 07/13/2016 0857   CO2 28 07/13/2016 0857   BUN 10 07/13/2016 0857   CREATININE 0.74 07/13/2016 0857   CREATININE 0.67 07/09/2014 1321   CREATININE 0.70 04/30/2014 1455      Component Value Date/Time   CALCIUM 8.9 07/13/2016 0857   ALKPHOS 61 06/28/2016 1117   ALKPHOS 66 07/09/2014 1321   AST 21 06/28/2016 1117   AST 19 07/09/2014 1321   ALT 19 06/28/2016 1117   ALT 18 07/09/2014 1321   BILITOT 0.8 06/28/2016 1117   BILITOT 0.6 07/09/2014 1321        ASSESSMENT & PLAN:   Carcinoma of overlapping sites of right breast in female, estrogen receptor positive ([place]) #  Stage IV recurrent/metastatic. Left chest wall/upper outer breast nodule biopsy- positive for invasive ductal carcinoma- ER/PR positive HER-2/neu negative breast cancer. Bx of right chest wall- positive for [facility]; ER/PR-pos; Her-2 neu-NEG. MRI brain-NEG.   # currently on  Femara [abemaciclib on hold sec to pancytopenia/see discussion below].  # Severe thrombocytopenia [platelet count 12 today]; with worsening white count [ANC 0.8]; hemoglobin 9.5- discussed with hematopathologist- again no evidence of MDS. Patient's platelets have not improved on prednisone; IVIG [IVIG 469m/kg/day x 5 days]; currently on N-plate [started 52/49] No significant improvement in the platelet count today. Recommend platelet transfusion today.  # Recheck labs on 5/21- repeat N-plate; check CBC twice a week. Follow-up with me the week after.   # Given the difficult situation with declining platelets/pancytopenia- discussed with the patient regarding referral to higher Center- for evaluation. Patient agreeable. Spoke to Dr. FRoyce Macadamiawho kindly agrees to evaluate the patient. He will plan [facility] and then see the patient.      GCammie Sickle MD 07/26/2016 2:23 PM

## 2016-07-26 NOTE — Assessment & Plan Note (Addendum)
#  Stage IV recurrent/metastatic. Left chest wall/upper outer breast nodule biopsy- positive for invasive ductal carcinoma- ER/PR positive HER-2/neu negative breast cancer. Bx of right chest wall- positive for [facility]; ER/PR-pos; Her-2 neu-NEG. MRI brain-NEG.   # currently on Femara [abemaciclib on hold sec to pancytopenia/see discussion below].  # Severe thrombocytopenia [platelet count 12 today]; with worsening white count [ANC 0.8]; hemoglobin 9.5- discussed with hematopathologist- again no evidence of MDS. Patient's platelets have not improved on prednisone; IVIG [IVIG 488m/kg/day x 5 days]; currently on N-plate [started 54/09] No significant improvement in the platelet count today. Recommend platelet transfusion today.  # Recheck labs on 5/21- repeat N-plate; check CBC twice a week. Follow-up with me the week after.   # Given the difficult situation with declining platelets/pancytopenia- discussed with the patient regarding referral to higher Center- for evaluation. Patient agreeable. Spoke to Dr. FRoyce Macadamiawho kindly agrees to evaluate the patient. He will plan [facility] and then see the patient.

## 2016-07-28 ENCOUNTER — Other Ambulatory Visit: Payer: Self-pay

## 2016-07-28 ENCOUNTER — Emergency Department
Admission: EM | Admit: 2016-07-28 | Discharge: 2016-07-28 | Disposition: A | Discharge status: Home / Self Care | Payer: Medicare Other | Attending: Emergency Medicine | Admitting: Emergency Medicine

## 2016-07-28 ENCOUNTER — Encounter: Payer: Self-pay | Admitting: Emergency Medicine

## 2016-07-28 DIAGNOSIS — E871 Hypo-osmolality and hyponatremia: Secondary | ICD-10-CM | POA: Insufficient documentation

## 2016-07-28 DIAGNOSIS — I10 Essential (primary) hypertension: Secondary | ICD-10-CM | POA: Insufficient documentation

## 2016-07-28 DIAGNOSIS — R531 Weakness: Secondary | ICD-10-CM | POA: Diagnosis present

## 2016-07-28 DIAGNOSIS — F17210 Nicotine dependence, cigarettes, uncomplicated: Secondary | ICD-10-CM | POA: Diagnosis not present

## 2016-07-28 DIAGNOSIS — Z853 Personal history of malignant neoplasm of breast: Secondary | ICD-10-CM | POA: Diagnosis not present

## 2016-07-28 LAB — URINALYSIS, COMPLETE (UACMP) WITH MICROSCOPIC
Bacteria, UA: NONE SEEN
Bilirubin Urine: NEGATIVE
Glucose, UA: NEGATIVE mg/dL
HGB URINE DIPSTICK: NEGATIVE
KETONES UR: NEGATIVE mg/dL
LEUKOCYTES UA: NEGATIVE
Nitrite: NEGATIVE
PROTEIN: NEGATIVE mg/dL
RBC / HPF: NONE SEEN RBC/hpf (ref 0–5)
SQUAMOUS EPITHELIAL / LPF: NONE SEEN
Specific Gravity, Urine: 1.002 — ABNORMAL LOW (ref 1.005–1.030)
pH: 7 (ref 5.0–8.0)

## 2016-07-28 LAB — PREPARE PLATELET PHERESIS: Unit division: 0

## 2016-07-28 LAB — CBC
HCT: 25.5 % — ABNORMAL LOW (ref 35.0–47.0)
Hemoglobin: 9 g/dL — ABNORMAL LOW (ref 12.0–16.0)
MCH: 37.6 pg — AB (ref 26.0–34.0)
MCHC: 35.3 g/dL (ref 32.0–36.0)
MCV: 106.6 fL — AB (ref 80.0–100.0)
Platelets: 41 10*3/uL — ABNORMAL LOW (ref 150–440)
RBC: 2.39 MIL/uL — ABNORMAL LOW (ref 3.80–5.20)
RDW: 15.3 % — ABNORMAL HIGH (ref 11.5–14.5)
WBC: 2.1 10*3/uL — ABNORMAL LOW (ref 3.6–11.0)

## 2016-07-28 LAB — BASIC METABOLIC PANEL
ANION GAP: 8 (ref 5–15)
BUN: 8 mg/dL (ref 6–20)
CHLORIDE: 93 mmol/L — AB (ref 101–111)
CO2: 27 mmol/L (ref 22–32)
Calcium: 8.9 mg/dL (ref 8.9–10.3)
Creatinine, Ser: 0.87 mg/dL (ref 0.44–1.00)
GFR calc Af Amer: >60 mL/min (ref >60)
GFR calc non Af Amer: >60 mL/min (ref >60)
Glucose, Bld: 104 mg/dL — ABNORMAL HIGH (ref 65–99)
Potassium: 4.2 mmol/L (ref 3.5–5.1)
Sodium: 128 mmol/L — ABNORMAL LOW (ref 135–145)

## 2016-07-28 LAB — TROPONIN I

## 2016-07-28 LAB — BPAM PLATELET PHERESIS
BLOOD PRODUCT EXPIRATION DATE: 201805172359
ISSUE DATE / TIME: 201805171409
Unit Type and Rh: 8400

## 2016-07-28 MED ORDER — SODIUM CHLORIDE 0.9 % IV BOLUS (SEPSIS)
1000.0000 mL | Freq: Once | INTRAVENOUS | Status: AC
  Administered 2016-07-28: 1000 mL via INTRAVENOUS

## 2016-07-28 NOTE — ED Notes (Signed)

## 2016-07-28 NOTE — ED Triage Notes (Signed)
Pt states that she received a bag of platelets on Thursday and began having an allergic reaction. Pt states she is continuing to take the benadryl due to continuing to develop hives. Pt states that she feels weak and "trembly", also reports that her BP has been up. Pt with rash noted to both arms, denies any airway involvement at this time. Pt is alert and oriented, respirations even and unlabored at this time.

## 2016-07-28 NOTE — Discharge Instructions (Signed)
Your exam and evaluation are reassuring and improving today in emergency department.  Return to emergency department immediately for any new or worsening weakness, slurred speech, fever, trouble breathing, chest pain, vomiting, skin rash, or any other symptoms concerning to you.

## 2016-07-28 NOTE — ED Provider Notes (Signed)
[facility] Emergency Department Provider Note ____________________________________________   I have reviewed the triage vital signs and the triage nursing note.  HISTORY  Chief Complaint Weakness; Hypertension; and Allergic Reaction   Historian Patient and spouse and daughter  HPI Shelly Arias is a 71 y.o. female history of bilateral breast cancer 11 years ago, followed by metastatic recurrence, patient has been on all immunotherapy pill over the past several weeks, and then developed blood problem which she describes as neutropenia, low hemoglobin, and low platelets. She received platelet transfusion on Thursday and then developed hives and itching and trouble breathing so with allergic reaction.  She has been on Benadryl at home and is reporting fatigue and a feeling of rubbery legs bilaterally, and dry mouth yesterday and today.  She also has some issues with panic, and felt panicky this morning and had elevated blood pressure 258 systolic.  States that she is still having intermittent arm/skin breakouts and itching is continuing to take Benadryl. No trouble breathing or chest pain. No fevers. No nausea or vomiting. No headache. No focal weakness or numbness or confusion altered mental status.    Past Medical History:  Diagnosis Date  . Breast cancer ([place])    right, lumpectomy, radiation, chemo  . Breast cancer ([place])    Right, 2007  . Breast cancer ([place]) 06/20/2016   INVASIVE DUCTAL CARCINOMA.  . Headache   . History of chemotherapy   . History of radiation therapy   . Hyperlipidemia   . Hypertension   . Osteoarthritis    right hip  . Osteoporosis   . Squamous cell carcinoma    leg, Followed by Dr. Nicole Kindred    Patient Active Problem List   Diagnosis Date Noted  . 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 Persistent headaches 07/02/2016  . Mass of upper inner quadrant of left breast 06/20/2016  . Carcinoma of overlapping sites of right breast in  female, estrogen receptor positive ([place]) 06/08/2016  . Fatigue 06/08/2016  . Chronic fatigue 06/08/2016  . Nutritional anemia, unspecified 06/08/2016  . Prediabetes 01/25/2016  . Tobacco abuse 11/06/2012  . History of breast cancer 11/06/2012  . Osteopenia 11/08/2011  . Hyperlipidemia 11/02/2010  . Essential hypertension 12/23/2006  . GERD 12/23/2006    Past Surgical History:  Procedure Laterality Date  . ABDOMINAL HYSTERECTOMY    . BREAST BIOPSY Left 2002   left breast, calcifications  . BREAST BIOPSY Left 06/20/2016   INVASIVE DUCTAL CARCINOMA.  Marland Kitchen BREAST EXCISIONAL BIOPSY Right 2007   positive  . bunion repair    . left breast biopsy    . NASAL SINUS SURGERY    . right hip replacement    . SHOULDER SURGERY    . SQUAMOUS CELL CARCINOMA EXCISION     right leg, Dr. Nicole Kindred  . TOTAL HIP ARTHROPLASTY     right    Prior to Admission medications   Medication Sig Start Date End Date Taking? Authorizing Provider  Abemaciclib 150 MG TABS Take 150 mg by mouth every 12 (twelve) hours. Patient not taking: Reported on 07/09/2016 07/02/16   [name], MD  ALPRAZolam Duanne Moron) 0.5 MG tablet Take 1 tablet (0.5 mg total) by mouth every 8 (eight) hours as needed for anxiety or sleep. 07/02/16   [name], MD  Cholecalciferol (VITAMIN D3) 1000 units CAPS Take by mouth.    [provider]  Fexofenadine HCl (ALLEGRA PO) Take 1 capsule by mouth.    [provider]  letrozole (FEMARA) 2.5 MG tablet Take 1 tablet (2.5 mg total) by mouth daily. Once a day. 07/02/16   [name], MD  metoprolol succinate (TOPROL-XL) 25 MG 24 hr tablet Take 1 tablet (25 mg total) by mouth daily. 05/11/16   Coral Spikes, DO  ondansetron (ZOFRAN) 8 MG tablet Take 1 tablet (8 mg total) by mouth every 8 (eight) hours as needed for nausea or vomiting. 07/20/16   [name], MD  pravastatin (PRAVACHOL) 40 MG tablet Take 1 tablet (40 mg total) by mouth daily. 05/11/16    Coral Spikes, DO  predniSONE (DELTASONE) 20 MG tablet 3 pills once a day; with breakfast; do not stop until instructed. 06/22/16   [name], MD  Probiotic Product (PROBIOTIC & ACIDOPHILUS EX ST PO) Take by mouth daily.    [provider]  promethazine (PHENERGAN) 25 MG tablet Take 1 tablet (25 mg total) by mouth every 6 (six) hours as needed for nausea or vomiting. 07/19/16   [name], MD    Allergies  Allergen Reactions  . Fish Allergy   . Morphine Sulfate     REACTION: anaphylaxis  . Oxycodone     Nausea and vomiting    Family History  Problem Relation Age of Onset  . Heart disease Father 81  . Heart disease Mother   . Hyperlipidemia Mother   . Heart disease Brother   . Diabetes Maternal Grandmother   . Breast cancer Cousin     Social History Social History  Substance Use Topics  . Smoking status: Current Some Day Smoker    Packs/day: 0.10    Years: 30.00    Types: Cigarettes  . Smokeless tobacco: Never Used  . Alcohol use 0.0 oz/week     Comment: socially    Review of Systems  Constitutional: Negative for fever.  ositive for fatigue. Eyes: Negative for visual changes. ENT: Negative for sore throat. Cardiovascular: Negative for chest pain. Respiratory: Negative for shortness of breath. Gastrointestinal: Negative for abdominal pain, vomiting and diarrhea. Genitourinary: Negative for dysuria. Musculoskeletal: Negative for back pain. Skin: Negative for rash. Neurological: Negative for headache.  ____________________________________________   PHYSICAL EXAM:  VITAL SIGNS: ED Triage Vitals  Enc Vitals Group     BP 07/28/16 1313 (!) 182/78     Pulse Rate 07/28/16 1313 (!) 104     Resp 07/28/16 1313 18     Temp 07/28/16 1313 98.7 F (37.1 C)     Temp Source 07/28/16 1313 Oral     SpO2 07/28/16 1313 100 %     Weight 07/28/16 1315 157 lb (71.2 kg)     Height 07/28/16 1315 '5\' 1"'$  (1.549 m)     Head Circumference --      Peak  Flow --      Pain Score --      Pain Loc --      Pain Edu? --      Excl. in [place]? --      Constitutional: Alert and oriented. Well appearing and in no distress. HEENT   Head: Normocephalic and atraumatic.      Eyes: Conjunctivae are normal. Pupils equal and round.       Ears:         Nose: No congestion/rhinnorhea.   Mouth/Throat: Mucous membranes are moist.   Neck: No stridor. Cardiovascular/Chest: Normal rate, regular rhythm.  No murmurs, rubs, or gallops. Respiratory: Normal respiratory effort without tachypnea nor retractions. Breath sounds are clear  and equal bilaterally. No wheezes/rales/rhonchi. Gastrointestinal: Soft. No distention, no guarding, no rebound. Nontender.   Genitourinary/rectal:Deferred Musculoskeletal: Nontender with normal range of motion in all extremities. No joint effusions.  No lower extremity tenderness.  No edema. Neurologic:  Normal speech and language. No gross or focal neurologic deficits are appreciated. Skin:  Skin is warm, dry and intact. Mild patchy redness to the arms. Psychiatric: Mood and affect are normal. Speech and behavior are normal. Patient exhibits appropriate insight and judgment. somewhat anxious.   ____________________________________________  LABS (pertinent positives/negatives)  Labs Reviewed  BASIC METABOLIC PANEL - Abnormal; Notable for the following:       Result Value   Sodium 128 (*)    Chloride 93 (*)    Glucose, Bld 104 (*)    All other components within normal limits  CBC  URINALYSIS, COMPLETE (UACMP) WITH MICROSCOPIC  TROPONIN I    ____________________________________________    EKG I, Lisa Roca, MD, the attending physician have personally viewed and interpreted all ECGs.  97 bpm normal sinus rhythm. Narrow QRS. Normal axis. Normal ST and T-wave ____________________________________________  RADIOLOGY All Xrays were viewed by me. Imaging interpreted by  Radiologist.  none __________________________________________  PROCEDURES  Procedure(s) performed: None  Critical Care performed: None  ____________________________________________   ED COURSE / ASSESSMENT AND PLAN  Pertinent labs & imaging results that were available during my care of the patient were reviewed by me and considered in my medical decision making (see chart for details).   Ms. Barrientes is overall well-appearing but complaining of severe fatigue. She states that she still having some itchiness and occasional intermittent skin rash after allergic reaction to platelet transfusion on Thursday. She started taking Benadryl. It's possible that her symptoms may be due to side effect from Benadryl.  Slightly elevated blood pressure. Nonfocal neurologic exam. Lungs clear. Heart regular. Fevers.  Sodium 128, previously mildly hyponatremic at 130. I am going to give her a liter of saline.  Labs reassuring. Platelets are up to 41. Hemoglobin is 9.0, down from 9.5. White blood cell count is actually improved at 2.1 today.  I discussed these results with the patient and family and they feel comfortable confident to go home now high    CONSULTATIONS: None   Patient / Family / Caregiver informed of clinical course, medical decision-making process, and agree with plan.   I discussed return precautions, follow-up instructions, and discharge instructions with patient and/or family.  Discharge Instructions : Your exam and evaluation are reassuring and improving today in emergency department.  Return to emergency department immediately for any new or worsening weakness, slurred speech, fever, trouble breathing, chest pain, vomiting, skin rash, or any other symptoms concerning to you.  ___________________________________________   FINAL CLINICAL IMPRESSION(S) / ED DIAGNOSES   Final diagnoses:  Hyponatremia              Note: This dictation was prepared with  Dragon dictation. Any transcriptional errors that result from this process are unintentional    Lisa Roca, MD 07/28/16 1456

## 2016-07-28 NOTE — ED Notes (Signed)
Pt in NAD at this time. Pt is speaking with her family. Nurse updated pt that she would be d/c'd after her fluids.

## 2016-07-30 ENCOUNTER — Inpatient Hospital Stay: Payer: Medicare Other

## 2016-07-30 ENCOUNTER — Telehealth: Payer: Self-pay | Admitting: Pharmacist

## 2016-07-30 ENCOUNTER — Inpatient Hospital Stay: Payer: Medicare Other | Admitting: Internal Medicine

## 2016-07-30 ENCOUNTER — Other Ambulatory Visit: Payer: Self-pay | Admitting: Internal Medicine

## 2016-07-30 ENCOUNTER — Telehealth: Payer: Self-pay | Admitting: *Deleted

## 2016-07-30 VITALS — BP 138/76 | HR 101 | Temp 96.7°F | Resp 20

## 2016-07-30 DIAGNOSIS — Z79811 Long term (current) use of aromatase inhibitors: Secondary | ICD-10-CM | POA: Diagnosis not present

## 2016-07-30 DIAGNOSIS — Z170 Estrogen receptor positive status [ER+]: Principal | ICD-10-CM

## 2016-07-30 DIAGNOSIS — Z79899 Other long term (current) drug therapy: Secondary | ICD-10-CM | POA: Diagnosis not present

## 2016-07-30 DIAGNOSIS — D61818 Other pancytopenia: Secondary | ICD-10-CM | POA: Diagnosis not present

## 2016-07-30 DIAGNOSIS — C50811 Malignant neoplasm of overlapping sites of right female breast: Secondary | ICD-10-CM

## 2016-07-30 DIAGNOSIS — D693 Immune thrombocytopenic purpura: Secondary | ICD-10-CM

## 2016-07-30 LAB — COMPREHENSIVE METABOLIC PANEL
ALBUMIN: 3.7 g/dL (ref 3.5–5.0)
ALT: 17 U/L (ref 14–54)
ANION GAP: 8 (ref 5–15)
AST: 26 U/L (ref 15–41)
Alkaline Phosphatase: 61 U/L (ref 38–126)
BUN: 10 mg/dL (ref 6–20)
CHLORIDE: 95 mmol/L — AB (ref 101–111)
CO2: 26 mmol/L (ref 22–32)
CREATININE: 0.99 mg/dL (ref 0.44–1.00)
Calcium: 9.1 mg/dL (ref 8.9–10.3)
GFR, EST NON AFRICAN AMERICAN: 57 mL/min — AB (ref >60)
Glucose, Bld: 112 mg/dL — ABNORMAL HIGH (ref 65–99)
POTASSIUM: 4.1 mmol/L (ref 3.5–5.1)
SODIUM: 129 mmol/L — AB (ref 135–145)
Total Bilirubin: 0.5 mg/dL (ref 0.3–1.2)
Total Protein: 8.1 g/dL (ref 6.5–8.1)

## 2016-07-30 LAB — CBC WITH DIFFERENTIAL/PLATELET
Basophils Absolute: 0 10*3/uL (ref 0–0.1)
Basophils Relative: 1 %
EOS ABS: 0 10*3/uL (ref 0–0.7)
Eosinophils Relative: 1 %
HCT: 25.4 % — ABNORMAL LOW (ref 35.0–47.0)
HEMOGLOBIN: 9.2 g/dL — AB (ref 12.0–16.0)
Lymphocytes Relative: 36 %
Lymphs Abs: 0.7 10*3/uL — ABNORMAL LOW (ref 1.0–3.6)
MCH: 38.4 pg — ABNORMAL HIGH (ref 26.0–34.0)
MCHC: 36.1 g/dL — AB (ref 32.0–36.0)
MCV: 106.6 fL — ABNORMAL HIGH (ref 80.0–100.0)
MONOS PCT: 7 %
Monocytes Absolute: 0.1 10*3/uL — ABNORMAL LOW (ref 0.2–0.9)
NEUTROS PCT: 55 %
Neutro Abs: 1.1 10*3/uL — ABNORMAL LOW (ref 1.4–6.5)
Platelets: 29 10*3/uL — CL (ref 150–400)
RBC: 2.39 MIL/uL — AB (ref 3.80–5.20)
RDW: 15.3 % — ABNORMAL HIGH (ref 11.5–14.5)
WBC: 1.9 10*3/uL — AB (ref 3.6–11.0)

## 2016-07-30 LAB — SAMPLE TO BLOOD BANK

## 2016-07-30 MED ORDER — ROMIPLOSTIM 250 MCG ~~LOC~~ SOLR
142.0000 ug | Freq: Once | SUBCUTANEOUS | Status: AC
  Administered 2016-07-30: 140 ug via SUBCUTANEOUS
  Filled 2016-07-30: qty 0.28

## 2016-07-30 MED ORDER — TRIAMCINOLONE ACETONIDE 0.5 % EX OINT: 1.0000 "application " | TOPICAL_OINTMENT | Freq: Two times a day (BID) | CUTANEOUS | 0 refills | Status: DC

## 2016-07-30 NOTE — Progress Notes (Signed)
Pt here for treatment related to low platelets today. Pt still having rash, itching to chest and upper arms. Started Friday after receiving platelet transfusion. Spoke with Dr Rogue Bussing about rash. Dr Rogue Bussing to assess patient in infusion.

## 2016-07-30 NOTE — Telephone Encounter (Signed)
Lab called about critical platelet count.  Platelet count 29.  Dr Rogue Bussing notified.

## 2016-07-30 NOTE — Telephone Encounter (Signed)
Platlets today = 24. NPlate dose lst week was 40mg/kg. Telephone order taken from Dr. BBurlene Arntto increase dose to 286m/kg.

## 2016-07-31 ENCOUNTER — Encounter: Payer: Self-pay | Admitting: *Deleted

## 2016-07-31 DIAGNOSIS — R918 Other nonspecific abnormal finding of lung field: Secondary | ICD-10-CM | POA: Diagnosis not present

## 2016-07-31 DIAGNOSIS — D469 Myelodysplastic syndrome, unspecified: Secondary | ICD-10-CM | POA: Diagnosis not present

## 2016-07-31 LAB — ANA COMPREHENSIVE PANEL
Anti JO-1: <0.2 AI (ref 0.0–0.9)
Centromere Ab Screen: 0.2 AI (ref 0.0–0.9)
Ribonucleic Protein: 0.3 AI (ref 0.0–0.9)
SSA (RO) (ENA) ANTIBODY, IGG: 0.6 AI (ref 0.0–0.9)
ds DNA Ab: 3 IU/mL (ref 0–9)

## 2016-07-31 LAB — CANCER ANTIGEN 27.29: CA 27.29: 40.4 U/mL — AB (ref 0.0–38.6)

## 2016-07-31 NOTE — Progress Notes (Signed)
  Oncology Nurse Navigator Documentation  Navigator Location: [facility] (07/31/16 0900)   )Navigator Encounter Type: Letter/Fax/Email (07/31/16 0900)                                                    Time Spent with Patient: 15 (07/31/16 0900)    Mailed patient a thinking of you card.  She is to call if she has any questions or needs.

## 2016-08-02 ENCOUNTER — Inpatient Hospital Stay: Payer: Medicare Other

## 2016-08-02 DIAGNOSIS — D693 Immune thrombocytopenic purpura: Secondary | ICD-10-CM

## 2016-08-02 DIAGNOSIS — C50811 Malignant neoplasm of overlapping sites of right female breast: Secondary | ICD-10-CM

## 2016-08-02 DIAGNOSIS — Z170 Estrogen receptor positive status [ER+]: Principal | ICD-10-CM

## 2016-08-02 LAB — CBC WITH DIFFERENTIAL/PLATELET
BASOS ABS: 0 10*3/uL (ref 0–0.1)
Basophils Relative: 1 %
Eosinophils Absolute: 0 10*3/uL (ref 0–0.7)
Eosinophils Relative: 1 %
HCT: 24.5 % — ABNORMAL LOW (ref 35.0–47.0)
HEMOGLOBIN: 8.9 g/dL — AB (ref 12.0–16.0)
LYMPHS PCT: 29 %
Lymphs Abs: 0.8 10*3/uL — ABNORMAL LOW (ref 1.0–3.6)
MCH: 38.7 pg — ABNORMAL HIGH (ref 26.0–34.0)
MCHC: 36.4 g/dL — ABNORMAL HIGH (ref 32.0–36.0)
MCV: 106.2 fL — AB (ref 80.0–100.0)
MONO ABS: 0.3 10*3/uL (ref 0.2–0.9)
MONOS PCT: 10 %
NEUTROS ABS: 1.7 10*3/uL (ref 1.4–6.5)
NEUTROS PCT: 59 %
Platelets: 20 10*3/uL — CL (ref 150–400)
RBC: 2.31 MIL/uL — ABNORMAL LOW (ref 3.80–5.20)
RDW: 15.3 % — AB (ref 11.5–14.5)
WBC: 2.8 10*3/uL — ABNORMAL LOW (ref 3.6–11.0)

## 2016-08-03 ENCOUNTER — Encounter: Payer: Self-pay | Admitting: Intensive Care

## 2016-08-03 ENCOUNTER — Inpatient Hospital Stay
Admission: EM | Admit: 2016-08-03 | Discharge: 2016-08-05 | DRG: 812 | Disposition: A | Discharge status: Home / Self Care | Payer: Medicare Other | Attending: Internal Medicine | Admitting: Internal Medicine

## 2016-08-03 DIAGNOSIS — Z923 Personal history of irradiation: Secondary | ICD-10-CM

## 2016-08-03 DIAGNOSIS — D649 Anemia, unspecified: Secondary | ICD-10-CM | POA: Diagnosis not present

## 2016-08-03 DIAGNOSIS — R040 Epistaxis: Secondary | ICD-10-CM | POA: Diagnosis present

## 2016-08-03 DIAGNOSIS — Z9221 Personal history of antineoplastic chemotherapy: Secondary | ICD-10-CM | POA: Diagnosis not present

## 2016-08-03 DIAGNOSIS — Z7952 Long term (current) use of systemic steroids: Secondary | ICD-10-CM | POA: Diagnosis not present

## 2016-08-03 DIAGNOSIS — F17210 Nicotine dependence, cigarettes, uncomplicated: Secondary | ICD-10-CM | POA: Diagnosis present

## 2016-08-03 DIAGNOSIS — M1990 Unspecified osteoarthritis, unspecified site: Secondary | ICD-10-CM | POA: Diagnosis not present

## 2016-08-03 DIAGNOSIS — K922 Gastrointestinal hemorrhage, unspecified: Secondary | ICD-10-CM | POA: Diagnosis not present

## 2016-08-03 DIAGNOSIS — Z96641 Presence of right artificial hip joint: Secondary | ICD-10-CM | POA: Diagnosis present

## 2016-08-03 DIAGNOSIS — F411 Generalized anxiety disorder: Secondary | ICD-10-CM | POA: Diagnosis present

## 2016-08-03 DIAGNOSIS — Z79899 Other long term (current) drug therapy: Secondary | ICD-10-CM

## 2016-08-03 DIAGNOSIS — C50811 Malignant neoplasm of overlapping sites of right female breast: Secondary | ICD-10-CM | POA: Diagnosis present

## 2016-08-03 DIAGNOSIS — D62 Acute posthemorrhagic anemia: Secondary | ICD-10-CM | POA: Diagnosis not present

## 2016-08-03 DIAGNOSIS — C7800 Secondary malignant neoplasm of unspecified lung: Secondary | ICD-10-CM | POA: Diagnosis present

## 2016-08-03 DIAGNOSIS — Z79811 Long term (current) use of aromatase inhibitors: Secondary | ICD-10-CM | POA: Diagnosis not present

## 2016-08-03 DIAGNOSIS — I10 Essential (primary) hypertension: Secondary | ICD-10-CM | POA: Diagnosis present

## 2016-08-03 DIAGNOSIS — Z91013 Allergy to seafood: Secondary | ICD-10-CM | POA: Diagnosis not present

## 2016-08-03 DIAGNOSIS — K219 Gastro-esophageal reflux disease without esophagitis: Secondary | ICD-10-CM | POA: Diagnosis not present

## 2016-08-03 DIAGNOSIS — D72819 Decreased white blood cell count, unspecified: Secondary | ICD-10-CM | POA: Diagnosis present

## 2016-08-03 DIAGNOSIS — M810 Age-related osteoporosis without current pathological fracture: Secondary | ICD-10-CM | POA: Diagnosis not present

## 2016-08-03 DIAGNOSIS — D6959 Other secondary thrombocytopenia: Secondary | ICD-10-CM | POA: Diagnosis present

## 2016-08-03 DIAGNOSIS — Z170 Estrogen receptor positive status [ER+]: Secondary | ICD-10-CM

## 2016-08-03 DIAGNOSIS — D696 Thrombocytopenia, unspecified: Secondary | ICD-10-CM | POA: Diagnosis not present

## 2016-08-03 DIAGNOSIS — Z885 Allergy status to narcotic agent status: Secondary | ICD-10-CM | POA: Diagnosis not present

## 2016-08-03 DIAGNOSIS — K921 Melena: Secondary | ICD-10-CM | POA: Diagnosis present

## 2016-08-03 DIAGNOSIS — C50919 Malignant neoplasm of unspecified site of unspecified female breast: Secondary | ICD-10-CM | POA: Diagnosis not present

## 2016-08-03 DIAGNOSIS — E785 Hyperlipidemia, unspecified: Secondary | ICD-10-CM | POA: Diagnosis not present

## 2016-08-03 DIAGNOSIS — R233 Spontaneous ecchymoses: Secondary | ICD-10-CM | POA: Diagnosis not present

## 2016-08-03 DIAGNOSIS — D693 Immune thrombocytopenic purpura: Secondary | ICD-10-CM

## 2016-08-03 DIAGNOSIS — Z853 Personal history of malignant neoplasm of breast: Secondary | ICD-10-CM | POA: Diagnosis not present

## 2016-08-03 DIAGNOSIS — D469 Myelodysplastic syndrome, unspecified: Secondary | ICD-10-CM | POA: Diagnosis not present

## 2016-08-03 LAB — URINALYSIS, COMPLETE (UACMP) WITH MICROSCOPIC
BACTERIA UA: NONE SEEN
BILIRUBIN URINE: NEGATIVE
Glucose, UA: NEGATIVE mg/dL
Hgb urine dipstick: NEGATIVE
KETONES UR: NEGATIVE mg/dL
Leukocytes, UA: NEGATIVE
Nitrite: NEGATIVE
PROTEIN: 30 mg/dL — AB
RBC / HPF: NONE SEEN RBC/hpf (ref 0–5)
Specific Gravity, Urine: 1.017 (ref 1.005–1.030)
pH: 6 (ref 5.0–8.0)

## 2016-08-03 LAB — COMPREHENSIVE METABOLIC PANEL
ALK PHOS: 64 U/L (ref 38–126)
ALT: 16 U/L (ref 14–54)
ANION GAP: 7 (ref 5–15)
AST: 25 U/L (ref 15–41)
Albumin: 3.7 g/dL (ref 3.5–5.0)
BUN: 13 mg/dL (ref 6–20)
CALCIUM: 9 mg/dL (ref 8.9–10.3)
CO2: 26 mmol/L (ref 22–32)
Chloride: 94 mmol/L — ABNORMAL LOW (ref 101–111)
Creatinine, Ser: 0.83 mg/dL (ref 0.44–1.00)
GFR calc non Af Amer: >60 mL/min (ref >60)
Glucose, Bld: 141 mg/dL — ABNORMAL HIGH (ref 65–99)
Potassium: 4.2 mmol/L (ref 3.5–5.1)
SODIUM: 127 mmol/L — AB (ref 135–145)
Total Bilirubin: 0.7 mg/dL (ref 0.3–1.2)
Total Protein: 8 g/dL (ref 6.5–8.1)

## 2016-08-03 LAB — CBC
HCT: 23.5 % — ABNORMAL LOW (ref 35.0–47.0)
HCT: 23 % — ABNORMAL LOW (ref 35.0–47.0)
Hemoglobin: 8.3 g/dL — ABNORMAL LOW (ref 12.0–16.0)
Hemoglobin: 8.2 g/dL — ABNORMAL LOW (ref 12.0–16.0)
MCH: 37.7 pg — AB (ref 26.0–34.0)
MCH: 38.2 pg — ABNORMAL HIGH (ref 26.0–34.0)
MCHC: 35.5 g/dL (ref 32.0–36.0)
MCHC: 35.7 g/dL (ref 32.0–36.0)
MCV: 106.2 fL — AB (ref 80.0–100.0)
MCV: 106.9 fL — ABNORMAL HIGH (ref 80.0–100.0)
PLATELETS: 18 10*3/uL — AB (ref 150–440)
Platelets: 20 10*3/uL — CL (ref 150–440)
RBC: 2.22 MIL/uL — ABNORMAL LOW (ref 3.80–5.20)
RBC: 2.16 MIL/uL — ABNORMAL LOW (ref 3.80–5.20)
RDW: 15.5 % — ABNORMAL HIGH (ref 11.5–14.5)
RDW: 15.4 % — ABNORMAL HIGH (ref 11.5–14.5)
WBC: 2.7 10*3/uL — ABNORMAL LOW (ref 3.6–11.0)
WBC: 2.8 10*3/uL — ABNORMAL LOW (ref 3.6–11.0)

## 2016-08-03 LAB — GLUCOSE, CAPILLARY: GLUCOSE-CAPILLARY: 98 mg/dL (ref 65–99)

## 2016-08-03 LAB — LIPASE, BLOOD: Lipase: 30 U/L (ref 11–51)

## 2016-08-03 LAB — PROTIME-INR
INR: 1.03
PROTHROMBIN TIME: 13.5 s (ref 11.4–15.2)

## 2016-08-03 LAB — MRSA PCR SCREENING: MRSA BY PCR: NEGATIVE

## 2016-08-03 MED ORDER — PRAVASTATIN SODIUM 40 MG PO TABS
40.0000 mg | ORAL_TABLET | Freq: Every day | ORAL | Status: DC
  Administered 2016-08-04 – 2016-08-05 (×2): 40 mg via ORAL
  Filled 2016-08-03 (×2): qty 1

## 2016-08-03 MED ORDER — ACETAMINOPHEN 325 MG PO TABS
650.0000 mg | ORAL_TABLET | Freq: Once | ORAL | Status: AC
  Administered 2016-08-04: 650 mg via ORAL
  Filled 2016-08-03: qty 2

## 2016-08-03 MED ORDER — PANTOPRAZOLE SODIUM 40 MG IV SOLR
40.0000 mg | Freq: Two times a day (BID) | INTRAVENOUS | Status: DC
  Administered 2016-08-03 – 2016-08-05 (×4): 40 mg via INTRAVENOUS
  Filled 2016-08-03 (×4): qty 40

## 2016-08-03 MED ORDER — LETROZOLE 2.5 MG PO TABS
2.5000 mg | ORAL_TABLET | Freq: Every day | ORAL | Status: DC
  Administered 2016-08-04 – 2016-08-05 (×2): 2.5 mg via ORAL
  Filled 2016-08-03 (×3): qty 1

## 2016-08-03 MED ORDER — DIPHENHYDRAMINE HCL 50 MG/ML IJ SOLN
25.0000 mg | Freq: Once | INTRAMUSCULAR | Status: AC
  Administered 2016-08-04: 25 mg via INTRAVENOUS
  Filled 2016-08-03: qty 0.5

## 2016-08-03 MED ORDER — DOCUSATE SODIUM 100 MG PO CAPS: 100.0000 mg | ORAL_CAPSULE | Freq: Two times a day (BID) | ORAL | Status: DC | PRN

## 2016-08-03 MED ORDER — PROMETHAZINE HCL 25 MG PO TABS: 25.0000 mg | ORAL_TABLET | Freq: Four times a day (QID) | ORAL | Status: DC | PRN

## 2016-08-03 MED ORDER — METOPROLOL SUCCINATE ER 25 MG PO TB24
25.0000 mg | ORAL_TABLET | Freq: Every day | ORAL | Status: DC
  Administered 2016-08-04 – 2016-08-05 (×2): 25 mg via ORAL
  Filled 2016-08-03 (×2): qty 1

## 2016-08-03 MED ORDER — DEXAMETHASONE SODIUM PHOSPHATE 4 MG/ML IJ SOLN
8.0000 mg | Freq: Once | INTRAMUSCULAR | Status: AC
  Administered 2016-08-04: 8 mg via INTRAVENOUS
  Filled 2016-08-03: qty 2

## 2016-08-03 MED ORDER — ONDANSETRON HCL 4 MG PO TABS: 8.0000 mg | ORAL_TABLET | Freq: Three times a day (TID) | ORAL | Status: DC | PRN

## 2016-08-03 MED ORDER — FAMOTIDINE IN NACL 20-0.9 MG/50ML-% IV SOLN
20.0000 mg | Freq: Once | INTRAVENOUS | Status: AC
  Administered 2016-08-04: 20 mg via INTRAVENOUS
  Filled 2016-08-03 (×2): qty 50

## 2016-08-03 MED ORDER — SODIUM CHLORIDE 0.9 % IV SOLN
80.0000 mg | Freq: Once | INTRAVENOUS | Status: AC
  Administered 2016-08-03: 80 mg via INTRAVENOUS
  Filled 2016-08-03: qty 80

## 2016-08-03 MED ORDER — ALPRAZOLAM 0.5 MG PO TABS
0.5000 mg | ORAL_TABLET | Freq: Three times a day (TID) | ORAL | Status: DC | PRN
  Administered 2016-08-04: 0.5 mg via ORAL
  Filled 2016-08-03: qty 1

## 2016-08-03 NOTE — H&P (Signed)
[place] [facility] [place] NAME: Shelly Arias    MR#:  [id number]  DATE OF BIRTH:  1945/08/14  DATE OF ADMISSION:  08/03/2016  PRIMARY CARE PHYSICIAN: Coral Spikes, DO   REQUESTING/REFERRING PHYSICIAN: Rifenbark  CHIEF COMPLAINT:   Chief Complaint  Patient presents with  . Abdominal Pain  . Blood In Stools    HISTORY OF PRESENT ILLNESS: Shelly Arias  is a 71 y.o. female with a known history of Metastatic breast cancer, headache, hyperlipidemia, hypertension, osteoporosis, thrombocytopenia- for last 1 month she is diagnosed again to have metastatic breast cancer which she had been treated 11 years ago. She also noted to have low platelet count and at cancer [facility] Dr.B is doing workup about that. Tried giving her platelet transfusion 2 weeks ago when she ended up having the rash is all over the body and wheezing and shortness of breath. Her platelet continue to go down and it is up to 20,000 today. For last 2 days she noted that she has equal amaurosis on her body and particularly cheerful hemorrhages and she also had dark color stool so she called Dr. Sharmaine Base office, he suggested to go to emergency room. In ER her stool was noted to be positive for guaiac and so ER physician spoke to cancer specialist on phone, he suggested to watch her tonight but no transfusions as hemodynamically she is stable and she does not have much dropping her hemoglobin. Oncologist will come and see her tomorrow and decide further plan.  PAST MEDICAL HISTORY:   Past Medical History:  Diagnosis Date  . Breast cancer ([place])    right, lumpectomy, radiation, chemo  . Breast cancer ([place])    Right, 2007  . Breast cancer ([place]) 06/20/2016   INVASIVE DUCTAL CARCINOMA.  . Headache   . History of chemotherapy   . History of radiation therapy   . Hyperlipidemia   . Hypertension   . Osteoarthritis    right hip  . Osteoporosis   . Squamous cell carcinoma    leg, Followed by Dr.  Nicole Kindred    PAST SURGICAL HISTORY: Past Surgical History:  Procedure Laterality Date  . ABDOMINAL HYSTERECTOMY    . BREAST BIOPSY Left 2002   left breast, calcifications  . BREAST BIOPSY Left 06/20/2016   INVASIVE DUCTAL CARCINOMA.  Marland Kitchen BREAST EXCISIONAL BIOPSY Right 2007   positive  . bunion repair    . left breast biopsy    . NASAL SINUS SURGERY    . right hip replacement    . SHOULDER SURGERY    . SQUAMOUS CELL CARCINOMA EXCISION     right leg, Dr. Nicole Kindred  . TOTAL HIP ARTHROPLASTY     right    SOCIAL HISTORY:  Social History  Substance Use Topics  . Smoking status: Current Some Day Smoker    Packs/day: 0.10    Years: 30.00    Types: Cigarettes  . Smokeless tobacco: Never Used  . Alcohol use 0.0 oz/week     Comment: socially    FAMILY HISTORY:  Family History  Problem Relation Age of Onset  . Heart disease Father 8  . Heart disease Mother   . Hyperlipidemia Mother   . Heart disease Brother   . Diabetes Maternal Grandmother   . Breast cancer Cousin     DRUG ALLERGIES:  Allergies  Allergen Reactions  . Fish Allergy   . Morphine Sulfate     REACTION: anaphylaxis  . Oxycodone  Nausea and vomiting    REVIEW OF SYSTEMS:   CONSTITUTIONAL: No fever, fatigue or weakness.  EYES: No blurred or double vision.  EARS, NOSE, AND THROAT: No tinnitus or ear pain.  RESPIRATORY: No cough, shortness of breath, wheezing or hemoptysis.  CARDIOVASCULAR: No chest pain, orthopnea, edema.  GASTROINTESTINAL: No nausea, vomiting, diarrhea or abdominal pain. Have blood in the stool. GENITOURINARY: No dysuria, hematuria.  ENDOCRINE: No polyuria, nocturia,  HEMATOLOGY: No anemia, easy bruising or bleeding, have potential hemorrhages and equal amaurosis on the body. SKIN: No rash or lesion. MUSCULOSKELETAL: No joint pain or arthritis.   NEUROLOGIC: No tingling, numbness, weakness.  PSYCHIATRY: No anxiety or depression.   MEDICATIONS AT HOME:  Prior to Admission  medications   Medication Sig Start Date End Date Taking? Authorizing Provider  Cholecalciferol (VITAMIN D3) 1000 units CAPS Take by mouth.   Yes [provider]  Fexofenadine HCl (ALLEGRA PO) Take 1 capsule by mouth.   Yes [provider]  letrozole (FEMARA) 2.5 MG tablet Take 1 tablet (2.5 mg total) by mouth daily. Once a day. 07/02/16  Yes [name], MD  metoprolol succinate (TOPROL-XL) 25 MG 24 hr tablet Take 1 tablet (25 mg total) by mouth daily. 05/11/16  Yes Cook, Jayce G, DO  pravastatin (PRAVACHOL) 40 MG tablet Take 1 tablet (40 mg total) by mouth daily. 05/11/16  Yes Cook, Jayce G, DO  Probiotic Product (PROBIOTIC & ACIDOPHILUS EX ST PO) Take by mouth daily.   Yes [provider]  triamcinolone ointment (KENALOG) 0.5 % Apply 1 application topically 2 (two) times daily. 07/30/16  Yes [name], MD  Abemaciclib 150 MG TABS Take 150 mg by mouth every 12 (twelve) hours. Patient not taking: Reported on 07/09/2016 07/02/16   [name], MD  ALPRAZolam Duanne Moron) 0.5 MG tablet Take 1 tablet (0.5 mg total) by mouth every 8 (eight) hours as needed for anxiety or sleep. 07/02/16   [name], MD  ondansetron (ZOFRAN) 8 MG tablet Take 1 tablet (8 mg total) by mouth every 8 (eight) hours as needed for nausea or vomiting. 07/20/16   [name], MD  predniSONE (DELTASONE) 20 MG tablet 3 pills once a day; with breakfast; do not stop until instructed. 06/22/16   [name], MD  promethazine (PHENERGAN) 25 MG tablet Take 1 tablet (25 mg total) by mouth every 6 (six) hours as needed for nausea or vomiting. 07/19/16   [name], MD      PHYSICAL EXAMINATION:   VITAL SIGNS: Blood pressure (!) 150/79, pulse 99, temperature 98.3 F (36.8 C), temperature source Oral, resp. rate 17, height 5\' 1"  (1.549 m), weight 71.2 kg (157 lb), SpO2 100 %.  GENERAL:  71 y.o.-year-old patient lying in the bed with no acute distress.   EYES: Pupils equal, round, reactive to light and accommodation. No scleral icterus. Extraocular muscles intact.  HEENT: Head atraumatic, normocephalic. Oropharynx and nasopharynx clear.  NECK:  Supple, no jugular venous distention. No thyroid enlargement, no tenderness.  LUNGS: Normal breath sounds bilaterally, no wheezing, rales,rhonchi or crepitation. No use of accessory muscles of respiration.  CARDIOVASCULAR: S1, S2 normal. No murmurs, rubs, or gallops.  ABDOMEN: Soft, nontender, nondistended. Bowel sounds present. No organomegaly or mass.  EXTREMITIES: No pedal edema, cyanosis, or clubbing.  NEUROLOGIC: Cranial nerves II through XII are intact. Muscle strength 5/5 in all extremities. Sensation intact. Gait not checked.  PSYCHIATRIC: The patient is alert and oriented x 3.  SKIN:  No obvious rash, lesion, or ulcer. She has petechial hemorrhages and ecchymosis on her limbs and face.  LABORATORY PANEL:   CBC  Recent Labs Lab 07/28/16 1334 07/30/16 1258 08/02/16 1320 08/03/16 1453  WBC 2.1* 1.9* 2.8* 2.7*  HGB 9.0* 9.2* 8.9* 8.3*  HCT 25.5* 25.4* 24.5* 23.5*  PLT 41* 29* 20* 20*  MCV 106.6* 106.6* 106.2* 106.2*  MCH 37.6* 38.4* 38.7* 37.7*  MCHC 35.3 36.1* 36.4* 35.5  RDW 15.3* 15.3* 15.3* 15.5*  LYMPHSABS  --  0.7* 0.8*  --   MONOABS  --  0.1* 0.3  --   EOSABS  --  0.0 0.0  --   BASOSABS  --  0.0 0.0  --    ------------------------------------------------------------------------------------------------------------------  Chemistries   Recent Labs Lab 07/28/16 1334 07/30/16 1258 08/03/16 1453  NA 128* 129* 127*  K 4.2 4.1 4.2  CL 93* 95* 94*  CO2 27 26 26   GLUCOSE 104* 112* 141*  BUN 8 10 13   CREATININE 0.87 0.99 0.83  CALCIUM 8.9 9.1 9.0  AST  --  26 25  ALT  --  17 16  ALKPHOS  --  61 64  BILITOT  --  0.5 0.7   ------------------------------------------------------------------------------------------------------------------ estimated creatinine clearance is  58.6 mL/min (by C-G formula based on SCr of 0.83 mg/dL). ------------------------------------------------------------------------------------------------------------------ No results for input(s): TSH, T4TOTAL, T3FREE, THYROIDAB in the last 72 hours.  Invalid input(s): FREET3   Coagulation profile  Recent Labs Lab 08/03/16 1649  INR 1.03   ------------------------------------------------------------------------------------------------------------------- No results for input(s): DDIMER in the last 72 hours. -------------------------------------------------------------------------------------------------------------------  Cardiac Enzymes  Recent Labs Lab 07/28/16 1334  TROPONINI <0.03   ------------------------------------------------------------------------------------------------------------------ Invalid input(s): POCBNP  ---------------------------------------------------------------------------------------------------------------  Urinalysis    Component Value Date/Time   COLORURINE YELLOW (A) 08/03/2016 1453   APPEARANCEUR CLEAR (A) 08/03/2016 1453   LABSPEC 1.017 08/03/2016 1453   PHURINE 6.0 08/03/2016 1453   GLUCOSEU NEGATIVE 08/03/2016 1453   HGBUR NEGATIVE 08/03/2016 1453   BILIRUBINUR NEGATIVE 08/03/2016 1453   KETONESUR NEGATIVE 08/03/2016 1453   PROTEINUR 30 (A) 08/03/2016 1453   NITRITE NEGATIVE 08/03/2016 1453   LEUKOCYTESUR NEGATIVE 08/03/2016 1453     RADIOLOGY: No results found.  EKG: Orders placed or performed during the hospital encounter of 07/28/16  . ED EKG  . ED EKG  . EKG    IMPRESSION AND PLAN:  * GI bleed   Chronic anemia, hemoglobin is currently stable around her baseline.   We will give Protonix IV twice a day, liquid diet orally   Currently hemodynamically stable so no need for transfusion.   GI consult for tomorrow morning.  * Thrombocytopenia   Possibly secondary to underlying malignancy.   She is being worked up at  [facility].   Oncology consult.   She had reaction to platelet transfusion 2 weeks ago and as today oncologist advise ER physician on phone, no transfusion needed at this point but oncologist will decide further management tomorrow once his severe again.   Because of her critical situation we would like to monitor her in stepdown unit for tonight.  * Hypertension   Continue home medication, stable.  All the records are reviewed and case discussed with ED provider. Management plans discussed with the patient, family and they are in agreement.  CODE STATUS: Full code. Code Status History    This patient does not have a recorded code status. Please follow your organizational policy for patients in this situation.    Advance Directive Documentation  Most Recent Value  Type of Advance Directive  Healthcare Power of Attorney, Living will  Pre-existing out of facility DNR order (yellow form or pink MOST form)  -  "MOST" Form in Place?  -      Patient's husband was present in the room during my visit.  TOTAL TIME TAKING CARE OF THIS PATIENT: 50 minutes.    Vaughan Basta M.D on 08/03/2016   Between 7am to 6pm - Pager - [phone]  After 6pm go to [email or web address] - password [email or web address] [facility]  Office  [phone]  CC: Primary care physician; Coral Spikes, DO   Note: This dictation was prepared with Dragon dictation along with smaller phrase technology. Any transcriptional errors that result from this process are unintentional.

## 2016-08-03 NOTE — Consult Note (Signed)
PULMONARY / CRITICAL CARE MEDICINE   Name: Shelly Arias MRN: [id number] DOB: 02/18/1946    ADMISSION DATE:  08/03/2016   CONSULTATION DATE:  08/03/16  REFERRING MD:  Dr. Anselm Jungling  REASON: Dark tarry stools and severe thrombocytopenia  CHIEF COMPLAINT:  Dark tarry stools  HISTORY OF PRESENT ILLNESS:   This is a 71 y/o female with a PMH as indicated below  presented to the ED with complaints of dark tarry stools that started in the morning. She has a history of chronic thrombocytopenia and states that she has no taste increased petechia in her lower extremities, upper extremities and torso. She also complained of lower abdominal pain. She was recently transfused platelets (07/26/2016) and states that she developed a severe reaction at the end of the transfusion. She is currently undergoing chemotherapy. She denies any bright red blood per rectum, but reports occasional nosebleeds. She is being admitted to the ICU for close monitoring. Her hemoglobin is currently 8.2 and her platelet count is 18  PAST MEDICAL HISTORY :  She  has a past medical history of Breast cancer ([place]); Breast cancer ([place]); Breast cancer ([place]) (06/20/2016); Headache; History of chemotherapy; History of radiation therapy; Hyperlipidemia; Hypertension; Osteoarthritis; Osteoporosis; and Squamous cell carcinoma.  PAST SURGICAL HISTORY: She  has a past surgical history that includes Total hip arthroplasty; bunion repair; Abdominal hysterectomy; Shoulder surgery; left breast biopsy; Nasal sinus surgery; right hip replacement; Squamous cell carcinoma excision; Breast biopsy (Left, 2002); Breast excisional biopsy (Right, 2007); and Breast biopsy (Left, 06/20/2016).  Allergies  Allergen Reactions  . Fish Allergy   . Morphine Sulfate     REACTION: anaphylaxis  . Oxycodone     Nausea and vomiting    No current facility-administered medications on file prior to encounter.    Current Outpatient Prescriptions on File Prior  to Encounter  Medication Sig  . Cholecalciferol (VITAMIN D3) 1000 units CAPS Take by mouth.  . Fexofenadine HCl (ALLEGRA PO) Take 1 capsule by mouth.  . letrozole (FEMARA) 2.5 MG tablet Take 1 tablet (2.5 mg total) by mouth daily. Once a day.  . metoprolol succinate (TOPROL-XL) 25 MG 24 hr tablet Take 1 tablet (25 mg total) by mouth daily.  . pravastatin (PRAVACHOL) 40 MG tablet Take 1 tablet (40 mg total) by mouth daily.  . Probiotic Product (PROBIOTIC & ACIDOPHILUS EX ST PO) Take by mouth daily.  Marland Kitchen triamcinolone ointment (KENALOG) 0.5 % Apply 1 application topically 2 (two) times daily.  . Abemaciclib 150 MG TABS Take 150 mg by mouth every 12 (twelve) hours. (Patient not taking: Reported on 07/09/2016)  . ALPRAZolam (XANAX) 0.5 MG tablet Take 1 tablet (0.5 mg total) by mouth every 8 (eight) hours as needed for anxiety or sleep.  Marland Kitchen ondansetron (ZOFRAN) 8 MG tablet Take 1 tablet (8 mg total) by mouth every 8 (eight) hours as needed for nausea or vomiting.  . predniSONE (DELTASONE) 20 MG tablet 3 pills once a day; with breakfast; do not stop until instructed.  . promethazine (PHENERGAN) 25 MG tablet Take 1 tablet (25 mg total) by mouth every 6 (six) hours as needed for nausea or vomiting.    FAMILY HISTORY:  Her indicated that her mother is deceased. She indicated that her father is deceased. She indicated that the status of her brother is unknown. She indicated that the status of her maternal grandmother is unknown. She indicated that the status of her cousin is unknown.    SOCIAL HISTORY: She  reports that she  has been smoking Cigarettes.  She has a 3.00 pack-year smoking history. She has never used smokeless tobacco. She reports that she drinks alcohol. She reports that she does not use drugs.  REVIEW OF SYSTEMS:   Constitutional: Negative for fever and chills.  HENT: Negative for congestion and rhinorrhea.  Eyes: Negative for redness and visual disturbance.  Respiratory: Negative for  shortness of breath and wheezing.  Cardiovascular: Negative for chest pain and palpitations.  Gastrointestinal: Negative  for nausea , vomiting but positive for dark tarry stools and abdominal pain Genitourinary: Negative for dysuria and urgency.  Endocrine: Denies polyuria, polyphagia and heat intolerance Musculoskeletal: Negative for myalgias and arthralgias.  Skin: Negative for pallor and wound.  Neurological: Negative for dizziness and headaches   SUBJECTIVE:   VITAL SIGNS: BP (!) 146/62 (BP Location: Left Arm)   Pulse 96   Temp 98.8 F (37.1 C)   Resp (!) 21   Ht 5\' 1"  (1.549 m)   Wt 161 lb 9.6 oz (73.3 kg)   SpO2 100%   BMI 30.53 kg/m   HEMODYNAMICS:    VENTILATOR SETTINGS:    INTAKE / OUTPUT: No intake/output data recorded.  PHYSICAL EXAMINATION: General: NAD, pleasant Neuro:  AAO X4, a focal deficits HEENT: PERRLA, no JVD Cardiovascular:  Rate and rhythm regular, S1, S2 audible, no murmurs, regurg or gallop Lungs:  Clear to auscultation bilaterally Abdomen: Nondistended, normal bowel sounds, palpation reveals mild tenderness in left and right lower quadrants Musculoskeletal: No deformities Extremities: +2 pulses, no edema Skin:  Warm and dry, diffuse petechiae in lower and upper extremities and torso  LABS:  BMET  Recent Labs Lab 07/28/16 1334 07/30/16 1258 08/03/16 1453  NA 128* 129* 127*  K 4.2 4.1 4.2  CL 93* 95* 94*  CO2 27 26 26   BUN 8 10 13   CREATININE 0.87 0.99 0.83  GLUCOSE 104* 112* 141*    Electrolytes  Recent Labs Lab 07/28/16 1334 07/30/16 1258 08/03/16 1453  CALCIUM 8.9 9.1 9.0    CBC  Recent Labs Lab 07/30/16 1258 08/02/16 1320 08/03/16 1453  WBC 1.9* 2.8* 2.7*  HGB 9.2* 8.9* 8.3*  HCT 25.4* 24.5* 23.5*  PLT 29* 20* 20*    Coag's  Recent Labs Lab 08/03/16 1649  INR 1.03    Sepsis Markers No results for input(s): LATICACIDVEN, PROCALCITON, O2SATVEN in the last 168 hours.  ABG No results for input(s):  PHART, PCO2ART, PO2ART in the last 168 hours.  Liver Enzymes  Recent Labs Lab 07/30/16 1258 08/03/16 1453  AST 26 25  ALT 17 16  ALKPHOS 61 64  BILITOT 0.5 0.7  ALBUMIN 3.7 3.7    Cardiac Enzymes  Recent Labs Lab 07/28/16 1334  TROPONINI <0.03    Glucose  Recent Labs Lab 08/03/16 2058  GLUCAP 98    Imaging No results found.   STUDIES:  None  CULTURES: None  ANTIBIOTICS: None  SIGNIFICANT EVENTS: 08/03/16: Admitted with thrombocytopenia and GI bleed  LINES/TUBES: Peripheral IVs   DISCUSSION: 71 year old Caucasian female with metastatic breast cancer-lung metastasis who presents with what appears to be a lower GI bleed secondary to severe thrombocytopenia and history of transfusion reaction. I have spoken with Dr. Rogue Bussing, patient's oncologist regarding patient's condition. He recommends pretreating patient and then transfusing her with break platelets  ASSESSMENT  Severe thrombocytopenia secondary to metastatic breast cancer/chemotherapy. GI bleed Leukopenic History of metastatic breast cancer History of hypertension. History of hyperlipidemia  PLAN Transfuse 1 unit of platelets per oncology  Pre-treat with dexamethasone 8mg  iv x1, benadryl 25 mg iv x1, tylenol 650mg  po x1 and pepcid 20mg  iv x 1 30 minutes before transfusion Monitor closely for further bleeding. Protonix 40 mg IV every 12. Clear liquid diet. Trend CBC  Patient is stable with no indications of active bleeding. Will transfer to telemetry and transfuse per oncology recommandations  FAMILY  - Updates: Husband at bedside. Updated on current treatment plan  - Inter-disciplinary family meet or Palliative Care meeting due by:  day 7  Plan of care discussed with Dr. Marylouise Stacks. [facility] ANP-BC Pulmonary and Critical Care Medicine [facility] Pager [phone] or [phone]  08/03/2016, 9:56 PM

## 2016-08-03 NOTE — ED Triage Notes (Addendum)
PAtient states "I was diagnosed two months ago with stage 4 metastic breast cancer. Platelets down to 20 yesterday. Last night petechia appeared all over my body. Today I am experiencing lower abdominal pain and dark stools." Patient ambulatory in triage. No distress noted. Patient had platelet transfusion on 5/17 and had a reaction at the end of the transfusion

## 2016-08-03 NOTE — Progress Notes (Signed)
Patient was admitted to ICU for stepdown. Shortly there after patient transferred to 2A room 238. Report given to [facility]. Patient vital signs stable, transported via wheelchair.

## 2016-08-03 NOTE — ED Provider Notes (Signed)
[facility] Emergency Department Provider Note  ____________________________________________   First MD Initiated Contact with Patient 08/03/16 1621     (approximate)  I have reviewed the triage vital signs and the nursing notes.   HISTORY  Chief Complaint Abdominal Pain and Blood In Stools    HPI Shelly Arias is a 71 y.o. female who self presents to the emergency department with mild crampy lower abdominal pain and dark stools for the past day. She has a complex past medical history including stage IV breast cancer as well as myelodysplastic syndrome. She recently has been diagnosed with thrombocytopenia and when she had a platelet transfusion she went into anaphylactic shock. She said that yesterday she also had an episode of epistaxis that she was able to stop. She is concerned because she has noted petechia on her legs. Today she called her oncologist Dr. Rogue Bussing who advised her to come to the emergency department.   Past Medical History:  Diagnosis Date  . Breast cancer ([place])    right, lumpectomy, radiation, chemo  . Breast cancer ([place])    Right, 2007  . Breast cancer ([place]) 06/20/2016   INVASIVE DUCTAL CARCINOMA.  . Headache   . History of chemotherapy   . History of radiation therapy   . Hyperlipidemia   . Hypertension   . Osteoarthritis    right hip  . Osteoporosis   . Squamous cell carcinoma    leg, Followed by Dr. Nicole Kindred    Patient Active Problem List   Diagnosis Date Noted  . 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 Persistent headaches 07/02/2016  . Mass of upper inner quadrant of left breast 06/20/2016  . Carcinoma of overlapping sites of right breast in female, estrogen receptor positive ([place]) 06/08/2016  . Fatigue 06/08/2016  . Chronic fatigue 06/08/2016  . Nutritional anemia, unspecified 06/08/2016  . Prediabetes 01/25/2016  . Tobacco abuse 11/06/2012  . History of breast cancer 11/06/2012  . Osteopenia 11/08/2011  .  Hyperlipidemia 11/02/2010  . Essential hypertension 12/23/2006  . GERD 12/23/2006    Past Surgical History:  Procedure Laterality Date  . ABDOMINAL HYSTERECTOMY    . BREAST BIOPSY Left 2002   left breast, calcifications  . BREAST BIOPSY Left 06/20/2016   INVASIVE DUCTAL CARCINOMA.  Marland Kitchen BREAST EXCISIONAL BIOPSY Right 2007   positive  . bunion repair    . left breast biopsy    . NASAL SINUS SURGERY    . right hip replacement    . SHOULDER SURGERY    . SQUAMOUS CELL CARCINOMA EXCISION     right leg, Dr. Nicole Kindred  . TOTAL HIP ARTHROPLASTY     right    Prior to Admission medications   Medication Sig Start Date End Date Taking? Authorizing Provider  Cholecalciferol (VITAMIN D3) 1000 units CAPS Take by mouth.   Yes [provider]  Fexofenadine HCl (ALLEGRA PO) Take 1 capsule by mouth.   Yes [provider]  letrozole (FEMARA) 2.5 MG tablet Take 1 tablet (2.5 mg total) by mouth daily. Once a day. 07/02/16  Yes [name], MD  metoprolol succinate (TOPROL-XL) 25 MG 24 hr tablet Take 1 tablet (25 mg total) by mouth daily. 05/11/16  Yes Cook, Jayce G, DO  pravastatin (PRAVACHOL) 40 MG tablet Take 1 tablet (40 mg total) by mouth daily. 05/11/16  Yes Cook, Jayce G, DO  Probiotic Product (PROBIOTIC & ACIDOPHILUS EX ST PO) Take by mouth daily.   Yes [provider]  triamcinolone  ointment (KENALOG) 0.5 % Apply 1 application topically 2 (two) times daily. 07/30/16  Yes [name], MD  Abemaciclib 150 MG TABS Take 150 mg by mouth every 12 (twelve) hours. Patient not taking: Reported on 07/09/2016 07/02/16   [name], MD  ALPRAZolam Duanne Moron) 0.5 MG tablet Take 1 tablet (0.5 mg total) by mouth every 8 (eight) hours as needed for anxiety or sleep. 07/02/16   [name], MD  ondansetron (ZOFRAN) 8 MG tablet Take 1 tablet (8 mg total) by mouth every 8 (eight) hours as needed for nausea or vomiting. 07/20/16   [name], MD    predniSONE (DELTASONE) 20 MG tablet 3 pills once a day; with breakfast; do not stop until instructed. 06/22/16   [name], MD  promethazine (PHENERGAN) 25 MG tablet Take 1 tablet (25 mg total) by mouth every 6 (six) hours as needed for nausea or vomiting. 07/19/16   [name], MD    Allergies Fish allergy; Morphine sulfate; and Oxycodone  Family History  Problem Relation Age of Onset  . Heart disease Father 26  . Heart disease Mother   . Hyperlipidemia Mother   . Heart disease Brother   . Diabetes Maternal Grandmother   . Breast cancer Cousin     Social History Social History  Substance Use Topics  . Smoking status: Current Some Day Smoker    Packs/day: 0.10    Years: 30.00    Types: Cigarettes  . Smokeless tobacco: Never Used  . Alcohol use 0.0 oz/week     Comment: socially    Review of Systems Constitutional: No fever/chills Eyes: No visual changes. ENT: No sore throat. Cardiovascular: Denies chest pain. Respiratory: Denies shortness of breath. Gastrointestinal: Positive abdominal pain.  Positive nausea, no vomiting.  No diarrhea.  No constipation. Genitourinary: Negative for dysuria. Musculoskeletal: Negative for back pain. Skin: Positive for rash. Neurological: Negative for headaches, focal weakness or numbness.   ____________________________________________   PHYSICAL EXAM:  VITAL SIGNS: ED Triage Vitals [08/03/16 1447]  Enc Vitals Group     BP (!) 156/65     Pulse Rate (!) 113     Resp 18     Temp 98.3 F (36.8 C)     Temp Source Oral     SpO2 100 %     Weight 157 lb (71.2 kg)     Height 5\' 1"  (1.549 m)     Head Circumference      Peak Flow      Pain Score 5     Pain Loc      Pain Edu?      Excl. in [place]?     Constitutional: Alert and oriented x 4 well appearing nontoxic no diaphoresis speaks in full, clear sentences Eyes: PERRL EOMI. Head: Atraumatic. Nose: No congestion/rhinnorhea. Mouth/Throat: No trismus Neck: No  stridor.   Cardiovascular: Normal rate, regular rhythm. Grossly normal heart sounds.  Good peripheral circulation. Respiratory: Normal respiratory effort.  No retractions. Lungs CTAB and moving good air Gastrointestinal: Soft mild diffuse tenderness without focality no rebound or guarding no peritonitis Musculoskeletal: No lower extremity edema   Neurologic:  Normal speech and language. No gross focal neurologic deficits are appreciated. Skin:  Scattered petechia across the legs. Psychiatric: Mood and affect are normal. Speech and behavior are normal.    ____________________________________________   DIFFERENTIAL  Thrombocytopenia, GI bleed, upper GI bleed, lower GI bleed, breast cancer ____________________________________________   LABS (all labs ordered are listed, but  only abnormal results are displayed)  Labs Reviewed  COMPREHENSIVE METABOLIC PANEL - Abnormal; Notable for the following:       Result Value   Sodium 127 (*)    Chloride 94 (*)    Glucose, Bld 141 (*)    All other components within normal limits  CBC - Abnormal; Notable for the following:    WBC 2.7 (*)    RBC 2.22 (*)    Hemoglobin 8.3 (*)    HCT 23.5 (*)    MCV 106.2 (*)    MCH 37.7 (*)    RDW 15.5 (*)    Platelets 20 (*)    All other components within normal limits  URINALYSIS, COMPLETE (UACMP) WITH MICROSCOPIC - Abnormal; Notable for the following:    Color, Urine YELLOW (*)    APPearance CLEAR (*)    Protein, ur 30 (*)    Squamous Epithelial / LPF 0-5 (*)    All other components within normal limits  LIPASE, BLOOD  PROTIME-INR  POC OCCULT BLOOD, ED  TYPE AND SCREEN    Hemoglobin 8.3 down from yesterday 8.9. Thrombocytopenia is stable __________________________________________  EKG   ____________________________________________  RADIOLOGY   ____________________________________________   PROCEDURES  Procedure(s) performed: no  Procedures  Critical Care performed:  yes  CRITICAL CARE Performed by: Darel Hong   Total critical care time: 35 minutes  Critical care time was exclusive of separately billable procedures and treating other patients.  Critical care was necessary to treat or prevent imminent or life-threatening deterioration.  Critical care was time spent personally by me on the following activities: development of treatment plan with patient and/or surrogate as well as nursing, discussions with consultants, evaluation of patient's response to treatment, examination of patient, obtaining history from patient or surrogate, ordering and performing treatments and interventions, ordering and review of laboratory studies, ordering and review of radiographic studies, pulse oximetry and re-evaluation of patient's condition.   Observation: no ____________________________________________   INITIAL IMPRESSION / ASSESSMENT AND PLAN / ED COURSE  Pertinent labs & imaging results that were available during my care of the patient were reviewed by me and considered in my medical decision making (see chart for details).  The patient arrives hemodynamically stable although with dropping hemoglobin and significant thrombocytopenia. She is guaiac positive although with brown stool. I discussed the case with her oncologist Dr. Rogue Bussing who said that there is no medical management that would raise her platelets and that she will require a transfusion. He recommends inpatient admission, Protonix, and GI consult. He will kindly consult on the patient in the morning.      ____________________________________________   FINAL CLINICAL IMPRESSION(S) / ED DIAGNOSES  Final diagnoses:  Anemia, unspecified type  Thrombocytopenia ([place])      NEW MEDICATIONS STARTED DURING THIS VISIT:  New Prescriptions   No medications on file     Note:  This document was prepared using Dragon voice recognition software and may include unintentional dictation  errors.     Darel Hong, MD 08/03/16 [phone]

## 2016-08-04 ENCOUNTER — Telehealth: Payer: Self-pay | Admitting: Internal Medicine

## 2016-08-04 DIAGNOSIS — M1990 Unspecified osteoarthritis, unspecified site: Secondary | ICD-10-CM

## 2016-08-04 DIAGNOSIS — E785 Hyperlipidemia, unspecified: Secondary | ICD-10-CM

## 2016-08-04 DIAGNOSIS — R233 Spontaneous ecchymoses: Secondary | ICD-10-CM

## 2016-08-04 DIAGNOSIS — M810 Age-related osteoporosis without current pathological fracture: Secondary | ICD-10-CM

## 2016-08-04 DIAGNOSIS — Z85828 Personal history of other malignant neoplasm of skin: Secondary | ICD-10-CM

## 2016-08-04 DIAGNOSIS — Z79811 Long term (current) use of aromatase inhibitors: Secondary | ICD-10-CM

## 2016-08-04 DIAGNOSIS — D62 Acute posthemorrhagic anemia: Secondary | ICD-10-CM

## 2016-08-04 DIAGNOSIS — Z79899 Other long term (current) drug therapy: Secondary | ICD-10-CM

## 2016-08-04 DIAGNOSIS — Z170 Estrogen receptor positive status [ER+]: Secondary | ICD-10-CM

## 2016-08-04 DIAGNOSIS — D696 Thrombocytopenia, unspecified: Secondary | ICD-10-CM

## 2016-08-04 DIAGNOSIS — K921 Melena: Secondary | ICD-10-CM

## 2016-08-04 DIAGNOSIS — F17210 Nicotine dependence, cigarettes, uncomplicated: Secondary | ICD-10-CM

## 2016-08-04 DIAGNOSIS — Z853 Personal history of malignant neoplasm of breast: Secondary | ICD-10-CM

## 2016-08-04 DIAGNOSIS — Z923 Personal history of irradiation: Secondary | ICD-10-CM

## 2016-08-04 DIAGNOSIS — Z803 Family history of malignant neoplasm of breast: Secondary | ICD-10-CM

## 2016-08-04 DIAGNOSIS — C50811 Malignant neoplasm of overlapping sites of right female breast: Secondary | ICD-10-CM

## 2016-08-04 DIAGNOSIS — I10 Essential (primary) hypertension: Secondary | ICD-10-CM

## 2016-08-04 LAB — CBC
HEMATOCRIT: 21.4 % — AB (ref 35.0–47.0)
Hemoglobin: 7.9 g/dL — ABNORMAL LOW (ref 12.0–16.0)
MCH: 39.1 pg — AB (ref 26.0–34.0)
MCHC: 36.8 g/dL — ABNORMAL HIGH (ref 32.0–36.0)
MCV: 106.1 fL — AB (ref 80.0–100.0)
Platelets: 69 10*3/uL — ABNORMAL LOW (ref 150–440)
RBC: 2.01 MIL/uL — ABNORMAL LOW (ref 3.80–5.20)
RDW: 15.3 % — ABNORMAL HIGH (ref 11.5–14.5)
WBC: 2.8 10*3/uL — AB (ref 3.6–11.0)

## 2016-08-04 LAB — BASIC METABOLIC PANEL
Anion gap: 8 (ref 5–15)
BUN: 9 mg/dL (ref 6–20)
CHLORIDE: 98 mmol/L — AB (ref 101–111)
CO2: 24 mmol/L (ref 22–32)
Calcium: 8.6 mg/dL — ABNORMAL LOW (ref 8.9–10.3)
Creatinine, Ser: 0.69 mg/dL (ref 0.44–1.00)
GFR calc non Af Amer: >60 mL/min (ref >60)
Glucose, Bld: 116 mg/dL — ABNORMAL HIGH (ref 65–99)
POTASSIUM: 4 mmol/L (ref 3.5–5.1)
SODIUM: 130 mmol/L — AB (ref 135–145)

## 2016-08-04 LAB — PREPARE RBC (CROSSMATCH)

## 2016-08-04 MED ORDER — HEPARIN SOD (PORK) LOCK FLUSH 100 UNIT/ML IV SOLN
500.0000 [IU] | Freq: Every day | INTRAVENOUS | Status: DC | PRN
  Filled 2016-08-04: qty 5

## 2016-08-04 MED ORDER — ACETAMINOPHEN 325 MG PO TABS
650.0000 mg | ORAL_TABLET | Freq: Once | ORAL | Status: AC
  Administered 2016-08-04: 650 mg via ORAL
  Filled 2016-08-04: qty 2

## 2016-08-04 MED ORDER — SODIUM CHLORIDE 0.9 % IV SOLN
250.0000 mL | Freq: Once | INTRAVENOUS | Status: AC
  Administered 2016-08-04: 250 mL via INTRAVENOUS

## 2016-08-04 MED ORDER — HEPARIN SOD (PORK) LOCK FLUSH 100 UNIT/ML IV SOLN
250.0000 [IU] | INTRAVENOUS | Status: DC | PRN
  Filled 2016-08-04: qty 3

## 2016-08-04 MED ORDER — DIPHENHYDRAMINE HCL 25 MG PO CAPS
25.0000 mg | ORAL_CAPSULE | Freq: Once | ORAL | Status: AC
  Administered 2016-08-04: 25 mg via ORAL
  Filled 2016-08-04: qty 1

## 2016-08-04 MED ORDER — SODIUM CHLORIDE 0.9% FLUSH: 3.0000 mL | INTRAVENOUS | Status: DC | PRN

## 2016-08-04 MED ORDER — METHYLPREDNISOLONE SODIUM SUCC 40 MG IJ SOLR
40.0000 mg | Freq: Once | INTRAMUSCULAR | Status: AC
  Administered 2016-08-04: 40 mg via INTRAVENOUS
  Filled 2016-08-04: qty 1

## 2016-08-04 MED ORDER — SODIUM CHLORIDE 0.9% FLUSH: 10.0000 mL | INTRAVENOUS | Status: DC | PRN

## 2016-08-04 NOTE — Telephone Encounter (Signed)
As per patient she has not received any information regarding her appointment at [facility]- for planned bone marrow biopsy on May 31; and also appointment with Dr. Foster on June 6th. Please provide the patient with details of this appointments at [facility].  

## 2016-08-04 NOTE — Consult Note (Signed)
[place] CONSULT NOTE  Patient Care Team: Coral Spikes, DO as PCP - General (Family Medicine) [name], DPM as Consulting Physician (Podiatry) [name], MD as Consulting Physician (Internal Medicine) Bary Castilla, Forest Gleason, MD (General Surgery)  CHIEF COMPLAINTS/PURPOSE OF CONSULTATION:  Metastatic breast cancer/severe thrombocytopenia  HISTORY OF PRESENTING ILLNESS:  Shelly Arias 71 y.o.  female with the recent diagnosis of recurrent/metastatic breast cancer ER/PR positive HER-2/neu negative currently on Femara. However patient also has been recently diagnosed with pancytopenia/severe thrombocytopenia. Patient had a bone marrow biopsy- which interestingly was negative including cytogenetics. However there is a high clinical suspicion for myelodysplastic syndrome given previous exposure to anthracycline for breast cancer approximately 10 years ago. Patient is awaiting a repeat evaluation with a bone marrow biopsy next week at [facility].  Approximately one week ago patient had a platelet transfusion; to which she had a reaction-with significant skin rash. This improved after using Kenalog cream. However on the day prior to admission patient noted to have multiple black colored stools. She also noted to have a petechial rash on her bilateral lower extremities and also on the forehead. Patient denies any gum bleeding or any hematemesis.    overnight patient has been transfused 1 unit of platelets- with prior premedications. Patient did not have any reactions post transfusion. Patient's platelets have improved to 68 from previously 20. However, the hemoglobin has trended down to 7.9 from a baseline of around 9. Patient complains of mild to moderate tiredness. Otherwise no fevers or chills. Her black stools have improved currently.  ROS: A complete 10 point review of system is done which is negative except mentioned above in history of present  illness  MEDICAL HISTORY:  Past Medical History:  Diagnosis Date  . Breast cancer ([place])    right, lumpectomy, radiation, chemo  . Breast cancer ([place])    Right, 2007  . Breast cancer ([place]) 06/20/2016   INVASIVE DUCTAL CARCINOMA.  . Headache   . History of chemotherapy   . History of radiation therapy   . Hyperlipidemia   . Hypertension   . Osteoarthritis    right hip  . Osteoporosis   . Squamous cell carcinoma    leg, Followed by Dr. Nicole Kindred    SURGICAL HISTORY: Past Surgical History:  Procedure Laterality Date  . ABDOMINAL HYSTERECTOMY    . BREAST BIOPSY Left 2002   left breast, calcifications  . BREAST BIOPSY Left 06/20/2016   INVASIVE DUCTAL CARCINOMA.  Marland Kitchen BREAST EXCISIONAL BIOPSY Right 2007   positive  . bunion repair    . left breast biopsy    . NASAL SINUS SURGERY    . right hip replacement    . SHOULDER SURGERY    . SQUAMOUS CELL CARCINOMA EXCISION     right leg, Dr. Nicole Kindred  . TOTAL HIP ARTHROPLASTY     right    SOCIAL HISTORY: Social History   Social History  . Marital status: Married    Spouse name: N/A  . Number of children: N/A  . Years of education: N/A   Occupational History  . Not on file.   Social History Main Topics  . Smoking status: Current Some Day Smoker    Packs/day: 0.10    Years: 30.00    Types: Cigarettes  . Smokeless tobacco: Never Used  . Alcohol use 0.0 oz/week     Comment: socially  . Drug use: No  . Sexual activity: No   Other Topics Concern  .  Not on file   Social History Narrative  . No narrative on file    FAMILY HISTORY: Family History  Problem Relation Age of Onset  . Heart disease Father 57  . Heart disease Mother   . Hyperlipidemia Mother   . Heart disease Brother   . Diabetes Maternal Grandmother   . Breast cancer Cousin     ALLERGIES:  is allergic to fish allergy; morphine sulfate; and oxycodone.  MEDICATIONS:  Current Facility-Administered Medications  Medication Dose Route Frequency  Provider Last Rate Last Dose  . 0.9 %  sodium chloride infusion  250 mL Intravenous Once [name], MD      . ALPRAZolam Duanne Moron) tablet 0.5 mg  0.5 mg Oral Q8H PRN Vaughan Basta, MD      . docusate sodium (COLACE) capsule 100 mg  100 mg Oral BID PRN Vaughan Basta, MD      . heparin lock flush 100 unit/mL  500 Units Intracatheter Daily PRN Charlaine Dalton R, MD      . heparin lock flush 100 unit/mL  250 Units Intracatheter PRN [name], MD      . letrozole [facility]) tablet 2.5 mg  2.5 mg Oral Daily Vaughan Basta, MD   2.5 mg at 08/04/16 1139  . metoprolol succinate (TOPROL-XL) 24 hr tablet 25 mg  25 mg Oral Daily Vaughan Basta, MD   25 mg at 08/04/16 3785  . ondansetron (ZOFRAN) tablet 8 mg  8 mg Oral Q8H PRN Vaughan Basta, MD      . pantoprazole (PROTONIX) injection 40 mg  40 mg Intravenous Q12H Vaughan Basta, MD   40 mg at 08/04/16 0928  . pravastatin (PRAVACHOL) tablet 40 mg  40 mg Oral Daily Vaughan Basta, MD   40 mg at 08/04/16 8850  . promethazine (PHENERGAN) tablet 25 mg  25 mg Oral Q6H PRN Vaughan Basta, MD      . sodium chloride flush (NS) 0.9 % injection 10 mL  10 mL Intracatheter PRN Charlaine Dalton R, MD      . sodium chloride flush (NS) 0.9 % injection 3 mL  3 mL Intracatheter PRN [name], MD          .  PHYSICAL EXAMINATION:  Vitals:   08/04/16 1207 08/04/16 1445  BP: 128/67 (!) 128/108  Pulse: 94 94  Resp: 14 16  Temp: 98.3 F (36.8 C) 98 F (36.7 C)   Filed Weights   08/03/16 1447 08/03/16 2050 08/03/16 2321  Weight: 157 lb (71.2 kg) 161 lb 9.6 oz (73.3 kg) 153 lb 3.2 oz (69.5 kg)    GENERAL: Well-nourished well-developed; Alert, no distress and comfortable.   Accompanied by her husband. EYES: no pallor or icterus OROPHARYNX: no thrush or ulceration. NECK: supple, no masses felt LYMPH:  no palpable lymphadenopathy in the cervical, axillary or  inguinal regions LUNGS: decreased breath sounds to auscultation at bases and  No wheeze or crackles HEART/CVS: regular rate & rhythm and no murmurs; No lower extremity edema ABDOMEN: abdomen soft, non-tender and normal bowel sounds Musculoskeletal:no cyanosis of digits and no clubbing  PSYCH: alert & oriented x 3 with fluent speech NEURO: no focal motor/sensory deficits SKIN:  Petechial rash noted bilateral lower extremities; and also the forehead.  LABORATORY DATA:  I have reviewed the data as listed Lab Results  Component Value Date   WBC 2.8 (L) 08/04/2016   HGB 7.9 (L) 08/04/2016   HCT 21.4 (L) 08/04/2016   MCV 106.1 (H) 08/04/2016  PLT 69 (L) 08/04/2016    Recent Labs  06/28/16 1117  07/30/16 1258 08/03/16 1453 08/04/16 0445  NA 134*  < > 129* 127* 130*  K 4.3  < > 4.1 4.2 4.0  CL 100*  < > 95* 94* 98*  CO2 28  < > 26 26 24   GLUCOSE 122*  < > 112* 141* 116*  BUN 24*  < > 10 13 9   CREATININE 0.92  < > 0.99 0.83 0.69  CALCIUM 9.3  < > 9.1 9.0 8.6*  GFRNONAA >60  < > 57* >60 >60  GFRAA >60  < > >60 >60 >60  PROT 7.3  --  8.1 8.0  --   ALBUMIN 4.4  --  3.7 3.7  --   AST 21  --  26 25  --   ALT 19  --  17 16  --   ALKPHOS 61  --  61 64  --   BILITOT 0.8  --  0.5 0.7  --   < > = values in this interval not displayed.  RADIOGRAPHIC STUDIES: I have personally reviewed the radiological images as listed and agreed with the findings in the report. No results found.  ASSESSMENT & PLAN:   # 71 year old female patient with recurrent metastatic breast cancer and also pancytopenia/severe thrombocytopenia is currently admitted to the hospital for melena/petechial rash.  # Severe thrombocytopenia/pancytopenia- likely primary bone marrow process like MDS [high clinical suspicion given prior anthracycline]. Recent bone marrow biopsy negative. Awaiting repeat evaluation with bone marrow biopsy next week at [facility]. Patient status post platelet transfusion yesterday- no  transfusion reactions noted. Today-platelets 68. Proceed with 1 unit of PRBC transfusion with premedications.  # Metastatic breast cancer ER/PR positive HER-2/neu negative the current- currently on letrozole. We'll hold CDK inhibitor in the context of above pancytopenia/thrombocytopenia.   # Black loose stools/melena-possible GI bleed secondary to severe thrombocytopenia. Planning to have a capsule study in the morning tomorrow. reviewed GI evaluation /appreciate GI evaluation.   # Thank you Dr.Patel  for allowing me to participate in the care of your pleasant patient. Please do not hesitate to contact me with questions or concerns in the interim. plan discussed with Dr. Posey Pronto.   All questions were answered. The patient knows to call the clinic with any problems, questions or concerns.    [name], MD 08/04/2016 3:09 PM

## 2016-08-04 NOTE — Consult Note (Signed)
Consultation  Referring Provider:      Primary Care Physician:  Coral Spikes, DO Primary Gastroenterologist:    San Jetty MD     Reason for Consultation:    GI bleed      Impression / Plan:   GI bleed: Probable upper source given complaints of dark stools. Last colonoscopy normal in 2009. Plan EGD in am now that PLT count 69 and HGB 7.9          HPI:   Shelly Arias is a 71 y.o. female  female with a known history of recent diagnosis of metastatic breast cancer, headache, hyperlipidemia, hypertension, osteoporosis, thrombocytopenia was being treated with chemotherapy presents with low platelet count and anemia with black stools. Denies any ASA or NSAIDS. HGB dropped from baselin 9.0 to 7.9 this am. Received platelet transfusion and PLT count now 69 this am. On Protonix 40 mg IV q 12.    Past Medical History:  Diagnosis Date  . Breast cancer ([place])    right, lumpectomy, radiation, chemo  . Breast cancer ([place])    Right, 2007  . Breast cancer ([place]) 06/20/2016   INVASIVE DUCTAL CARCINOMA.  . Headache   . History of chemotherapy   . History of radiation therapy   . Hyperlipidemia   . Hypertension   . Osteoarthritis    right hip  . Osteoporosis   . 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 ABDOMINAL HYSTERECTOMY    . BREAST BIOPSY Left 2002   left breast, calcifications  . BREAST BIOPSY Left 06/20/2016   INVASIVE DUCTAL CARCINOMA.  Marland Kitchen BREAST EXCISIONAL BIOPSY Right 2007   positive  . bunion repair    . left breast biopsy    . NASAL SINUS SURGERY    . right hip replacement    . SHOULDER SURGERY    . SQUAMOUS CELL CARCINOMA EXCISION     right leg, Dr. Nicole Kindred  . TOTAL HIP ARTHROPLASTY     right    Family History  Problem Relation Age of Onset  . Heart disease Father 1  . Heart disease Mother   . Hyperlipidemia Mother   . Heart disease Brother   . Diabetes Maternal Grandmother   .  Breast cancer Cousin       Social History  Substance Use Topics  . Smoking status: Current Some Day Smoker    Packs/day: 0.10    Years: 30.00    Types: Cigarettes  . Smokeless tobacco: Never Used  . Alcohol use 0.0 oz/week     Comment: socially    Prior to Admission medications   Medication Sig Start Date End Date Taking? Authorizing Provider  Cholecalciferol (VITAMIN D3) 1000 units CAPS Take by mouth.   Yes [provider]  Fexofenadine HCl (ALLEGRA PO) Take 1 capsule by mouth.   Yes [provider]  letrozole (FEMARA) 2.5 MG tablet Take 1 tablet (2.5 mg total) by mouth daily. Once a day. 07/02/16  Yes [name], MD  metoprolol succinate (TOPROL-XL) 25 MG 24 hr tablet Take 1 tablet (25 mg total) by mouth daily. 05/11/16  Yes Cook, Jayce G, DO  pravastatin (PRAVACHOL) 40 MG tablet Take 1 tablet (40 mg total) by mouth daily. 05/11/16  Yes Cook, Jayce G, DO  Probiotic Product (PROBIOTIC & ACIDOPHILUS EX ST PO) Take by mouth daily.   Yes [provider]  triamcinolone ointment (KENALOG) 0.5 %  Apply 1 application topically 2 (two) times daily. 07/30/16  Yes [name], MD  Abemaciclib 150 MG TABS Take 150 mg by mouth every 12 (twelve) hours. Patient not taking: Reported on 07/09/2016 07/02/16   [name], MD  ALPRAZolam Duanne Moron) 0.5 MG tablet Take 1 tablet (0.5 mg total) by mouth every 8 (eight) hours as needed for anxiety or sleep. 07/02/16   [name], MD  ondansetron (ZOFRAN) 8 MG tablet Take 1 tablet (8 mg total) by mouth every 8 (eight) hours as needed for nausea or vomiting. 07/20/16   [name], MD  predniSONE (DELTASONE) 20 MG tablet 3 pills once a day; with breakfast; do not stop until instructed. 06/22/16   [name], MD  promethazine (PHENERGAN) 25 MG tablet Take 1 tablet (25 mg total) by mouth every 6 (six) hours as needed for nausea or vomiting. 07/19/16   [name], MD    Current  Facility-Administered Medications  Medication Dose Route Frequency Provider Last Rate Last Dose  . ALPRAZolam Duanne Moron) tablet 0.5 mg  0.5 mg Oral Q8H PRN Vaughan Basta, MD      . docusate sodium (COLACE) capsule 100 mg  100 mg Oral BID PRN Vaughan Basta, MD      . letrozole [facility]) tablet 2.5 mg  2.5 mg Oral Daily Vaughan Basta, MD      . metoprolol succinate (TOPROL-XL) 24 hr tablet 25 mg  25 mg Oral Daily Vaughan Basta, MD   25 mg at 08/04/16 6269  . ondansetron (ZOFRAN) tablet 8 mg  8 mg Oral Q8H PRN Vaughan Basta, MD      . pantoprazole (PROTONIX) injection 40 mg  40 mg Intravenous Q12H Vaughan Basta, MD   40 mg at 08/04/16 0928  . pravastatin (PRAVACHOL) tablet 40 mg  40 mg Oral Daily Vaughan Basta, MD   40 mg at 08/04/16 4854  . promethazine (PHENERGAN) tablet 25 mg  25 mg Oral Q6H PRN Vaughan Basta, MD        Allergies as of 08/03/2016 - Review Complete 08/03/2016  Allergen Reaction Noted  . Fish allergy  11/02/2010  . Morphine sulfate  04/09/2006  . Oxycodone  02/14/2015     Review of Systems:    This is positive for those things mentioned in the HPI         Physical Exam:  Vital signs in last 24 hours: Temp:  [97.8 F (36.6 C)-99.3 F (37.4 C)] 98.6 F (37 C) (05/26 0800) Pulse Rate:  [56-113] 96 (05/26 0800) Resp:  [16-24] 16 (05/26 0800) BP: (116-163)/(58-91) 157/76 (05/26 0800) SpO2:  [57 %-100 %] 97 % (05/26 0800) Weight:  [69.5 kg (153 lb 3.2 oz)-73.3 kg (161 lb 9.6 oz)] 69.5 kg (153 lb 3.2 oz) (05/25 2321) Last BM Date: 08/04/16  General:  Well-developed, well-nourished and in no acute distress Eyes:  anicteric. ENT:   Mouth and posterior pharynx free of lesions.  Neck:   supple w/o thyromegaly or mass.  Lungs: Clear to auscultation bilaterally. Heart:  S1S2, no rubs, murmurs, gallops. Abdomen:  soft, non-tender, no hepatosplenomegaly, hernia, or mass and BS+.  Rectal: Lymph:  no  cervical or supraclavicular adenopathy. Extremities:   no edema Skin   petechiae on extremities. Neuro:  A&O x 3.  Psych:  appropriate mood and  Affect.   Data Reviewed:   LAB RESULTS:  Recent Labs  08/03/16 1453 08/03/16 2149 08/04/16 0445  WBC 2.7* 2.8* 2.8*  HGB 8.3* 8.2* 7.9*  HCT 23.5* 23.0* 21.4*  PLT 20* 18* 69*   BMET  Recent Labs  08/03/16 1453 08/04/16 0445  NA 127* 130*  K 4.2 4.0  CL 94* 98*  CO2 26 24  GLUCOSE 141* 116*  BUN 13 9  CREATININE 0.83 0.69  CALCIUM 9.0 8.6*   LFT  Recent Labs  08/03/16 1453  PROT 8.0  ALBUMIN 3.7  AST 25  ALT 16  ALKPHOS 64  BILITOT 0.7   PT/INR  Recent Labs  08/03/16 1649  LABPROT 13.5  INR 1.03    STUDIES: No results found.   PREVIOUS ENDOSCOPIES:                Thanks   LOS: 1 day   San Jetty MD @  08/04/2016, 11:12 AM

## 2016-08-04 NOTE — Progress Notes (Signed)
[place] [place] NAME: Shelly Arias    MR#:  [id number]  DATE OF BIRTH:  1945-09-21  SUBJECTIVE:  Came in with melena  REVIEW OF SYSTEMS:   Review of Systems  Constitutional: Negative for chills, fever and weight loss.  HENT: Negative for ear discharge, ear pain and nosebleeds.   Eyes: Negative for blurred vision, pain and discharge.  Respiratory: Negative for sputum production, shortness of breath, wheezing and stridor.   Cardiovascular: Negative for chest pain, palpitations, orthopnea and PND.  Gastrointestinal: Positive for melena. Negative for abdominal pain, diarrhea, nausea and vomiting.  Genitourinary: Negative for frequency and urgency.  Musculoskeletal: Negative for back pain and joint pain.  Neurological: Positive for weakness. Negative for sensory change, speech change and focal weakness.  Psychiatric/Behavioral: Negative for depression and hallucinations. The patient is not nervous/anxious.    Tolerating Diet:yes Tolerating PT: pending  DRUG ALLERGIES:   Allergies  Allergen Reactions  . Fish Allergy   . Morphine Sulfate     REACTION: anaphylaxis  . Oxycodone     Nausea and vomiting    VITALS:  Blood pressure (P) 139/69, pulse (P) 94, temperature (P) 98.2 F (36.8 C), temperature source (P) Oral, resp. rate (P) 16, height 5\' 1"  (1.549 m), weight 69.5 kg (153 lb 3.2 oz), SpO2 (P) 100 %.  PHYSICAL EXAMINATION:   Physical Exam  GENERAL:  71 y.o.-year-old patient lying in the bed with no acute distress.  EYES: Pupils equal, round, reactive to light and accommodation. No scleral icterus. Extraocular muscles intact.  HEENT: Head atraumatic, normocephalic. Oropharynx and nasopharynx clear.  NECK:  Supple, no jugular venous distention. No thyroid enlargement, no tenderness.  LUNGS: Normal breath sounds bilaterally, no wheezing, rales, rhonchi. No use of accessory muscles of respiration.  CARDIOVASCULAR: S1, S2  normal. No murmurs, rubs, or gallops.  ABDOMEN: Soft, nontender, nondistended. Bowel sounds present. No organomegaly or mass.  EXTREMITIES: No cyanosis, clubbing or edema b/l.    NEUROLOGIC: Cranial nerves II through XII are intact. No focal Motor or sensory deficits b/l.   PSYCHIATRIC:  patient is alert and oriented x 3.  SKIN: No obvious rash, lesion, or ulcer.   LABORATORY PANEL:  CBC  Recent Labs Lab 08/04/16 0445  WBC 2.8*  HGB 7.9*  HCT 21.4*  PLT 69*    Chemistries   Recent Labs Lab 08/03/16 1453 08/04/16 0445  NA 127* 130*  K 4.2 4.0  CL 94* 98*  CO2 26 24  GLUCOSE 141* 116*  BUN 13 9  CREATININE 0.83 0.69  CALCIUM 9.0 8.6*  AST 25  --   ALT 16  --   ALKPHOS 64  --   BILITOT 0.7  --    Cardiac Enzymes No results for input(s): TROPONINI in the last 168 hours. RADIOLOGY:  No results found. ASSESSMENT AND PLAN:  Shelly Arias  is a 71 y.o. female with a known history of Metastatic breast cancer, headache, hyperlipidemia, hypertension, osteoporosis, thrombocytopenia- for last 1 month she is diagnosed again to have metastatic breast cancer which she had been treated 11 years ago. She also noted to have low platelet count and at [facility] is doing workup about that. Tried giving her platelet transfusion 2 weeks ago when she ended up having the rash is all over the body and wheezing and shortness of breath  * GI bleed  acute on Chronic anemia, hemoglobin is currently stable around her baseline.   We will  give Protonix IV twice a day, liquid diet orally   Currently hemodynamically stable so no need for transfusion.   GI consult appreciated---EGD tomorrow  * Thrombocytopenia   Possibly secondary to underlying malignancy.   She is being worked up at [facility].   Oncology consult appreciated. plt 69K   She had reaction to platelet transfusion 2 weeks ago  * Hypertension   Continue home medication, stable. Case discussed with Care  Management/Social Worker. Management plans discussed with the patient, family and they are in agreement.  CODE STATUS: full  DVT Prophylaxis: *scd*  TOTAL TIME TAKING CARE OF THIS PATIENT: *30* minutes.  >50% time spent on counselling and coordination of care  POSSIBLE D/C IN *1-2* DAYS, DEPENDING ON CLINICAL CONDITION.  Note: This dictation was prepared with Dragon dictation along with smaller phrase technology. Any transcriptional errors that result from this process are unintentional.  Sade Hollon M.D on 08/04/2016 at 4:47 PM  Between 7am to 6pm - Pager - [phone]  After 6pm go to [email or web address] - password [email or web address] [facility]  Office  [phone]  CC: Primary care physician; Coral Spikes, DO

## 2016-08-05 ENCOUNTER — Encounter: Payer: Self-pay | Admitting: Anesthesiology

## 2016-08-05 ENCOUNTER — Inpatient Hospital Stay: Payer: Medicare Other | Admitting: Registered Nurse

## 2016-08-05 ENCOUNTER — Encounter
Admission: EM | Disposition: A | Discharge status: Home / Self Care | Payer: Self-pay | Source: Home / Self Care | Attending: Internal Medicine

## 2016-08-05 ENCOUNTER — Emergency Department
Admission: EM | Admit: 2016-08-05 | Discharge: 2016-08-05 | Disposition: A | Discharge status: Home / Self Care | Payer: Medicare Other | Attending: Emergency Medicine | Admitting: Emergency Medicine

## 2016-08-05 ENCOUNTER — Encounter: Payer: Self-pay | Admitting: Emergency Medicine

## 2016-08-05 DIAGNOSIS — Z79899 Other long term (current) drug therapy: Secondary | ICD-10-CM | POA: Insufficient documentation

## 2016-08-05 DIAGNOSIS — D696 Thrombocytopenia, unspecified: Secondary | ICD-10-CM

## 2016-08-05 DIAGNOSIS — F17200 Nicotine dependence, unspecified, uncomplicated: Secondary | ICD-10-CM | POA: Insufficient documentation

## 2016-08-05 DIAGNOSIS — Z79811 Long term (current) use of aromatase inhibitors: Secondary | ICD-10-CM | POA: Insufficient documentation

## 2016-08-05 DIAGNOSIS — Z96641 Presence of right artificial hip joint: Secondary | ICD-10-CM | POA: Insufficient documentation

## 2016-08-05 DIAGNOSIS — R233 Spontaneous ecchymoses: Secondary | ICD-10-CM

## 2016-08-05 DIAGNOSIS — I10 Essential (primary) hypertension: Secondary | ICD-10-CM | POA: Insufficient documentation

## 2016-08-05 DIAGNOSIS — Z85828 Personal history of other malignant neoplasm of skin: Secondary | ICD-10-CM | POA: Insufficient documentation

## 2016-08-05 DIAGNOSIS — Z853 Personal history of malignant neoplasm of breast: Secondary | ICD-10-CM | POA: Insufficient documentation

## 2016-08-05 HISTORY — PX: ESOPHAGOGASTRODUODENOSCOPY (EGD) WITH PROPOFOL: SHX5813

## 2016-08-05 LAB — CBC WITH DIFFERENTIAL/PLATELET
BASOS ABS: 0 10*3/uL (ref 0–0.1)
BASOS PCT: 0 %
EOS ABS: 0 10*3/uL (ref 0–0.7)
Eosinophils Relative: 0 %
HCT: 27.9 % — ABNORMAL LOW (ref 35.0–47.0)
Hemoglobin: 10 g/dL — ABNORMAL LOW (ref 12.0–16.0)
Lymphocytes Relative: 31 %
Lymphs Abs: 1.3 10*3/uL (ref 1.0–3.6)
MCH: 36 pg — ABNORMAL HIGH (ref 26.0–34.0)
MCHC: 36 g/dL (ref 32.0–36.0)
MCV: 100.1 fL — ABNORMAL HIGH (ref 80.0–100.0)
MONO ABS: 0.4 10*3/uL (ref 0.2–0.9)
MONOS PCT: 9 %
NEUTROS PCT: 60 %
Neutro Abs: 2.6 10*3/uL (ref 1.4–6.5)
PLATELETS: 62 10*3/uL — AB (ref 150–440)
RBC: 2.78 MIL/uL — ABNORMAL LOW (ref 3.80–5.20)
RDW: 21.6 % — AB (ref 11.5–14.5)
WBC: 4.3 10*3/uL (ref 3.6–11.0)

## 2016-08-05 LAB — BPAM RBC
Blood Product Expiration Date: 201806192359
ISSUE DATE / TIME: 201805261444
UNIT TYPE AND RH: 5100

## 2016-08-05 LAB — TYPE AND SCREEN
ABO/RH(D): O POS
Antibody Screen: NEGATIVE
UNIT DIVISION: 0

## 2016-08-05 LAB — BPAM PLATELET PHERESIS
BLOOD PRODUCT EXPIRATION DATE: 201805272359
ISSUE DATE / TIME: 201805260147
UNIT TYPE AND RH: 5100

## 2016-08-05 LAB — PREPARE PLATELET PHERESIS: Unit division: 0

## 2016-08-05 SURGERY — ESOPHAGOGASTRODUODENOSCOPY (EGD) WITH PROPOFOL
Anesthesia: General

## 2016-08-05 MED ORDER — SODIUM CHLORIDE 0.9 % IV SOLN
INTRAVENOUS | Status: DC
  Administered 2016-08-05: 09:00:00 via INTRAVENOUS

## 2016-08-05 MED ORDER — PROPOFOL 10 MG/ML IV BOLUS
INTRAVENOUS | Status: DC | PRN
  Administered 2016-08-05: 70 mg via INTRAVENOUS
  Administered 2016-08-05: 20 mg via INTRAVENOUS

## 2016-08-05 MED ORDER — PROPOFOL 500 MG/50ML IV EMUL
INTRAVENOUS | Status: DC | PRN
  Administered 2016-08-05: 150 ug/kg/min via INTRAVENOUS

## 2016-08-05 MED ORDER — PROPOFOL 10 MG/ML IV BOLUS
INTRAVENOUS | Status: AC
  Filled 2016-08-05: qty 20

## 2016-08-05 MED ORDER — GLYCOPYRROLATE 0.2 MG/ML IJ SOLN
INTRAMUSCULAR | Status: DC | PRN
  Administered 2016-08-05: 0.2 mg via INTRAVENOUS

## 2016-08-05 MED ORDER — MAGNESIUM CITRATE PO SOLN
1.0000 | Freq: Once | ORAL | Status: DC
  Filled 2016-08-05: qty 296

## 2016-08-05 MED ORDER — LIDOCAINE HCL (CARDIAC) 20 MG/ML IV SOLN
INTRAVENOUS | Status: DC | PRN
  Administered 2016-08-05 (×2): 40 mg via INTRAVENOUS

## 2016-08-05 MED ORDER — SODIUM CHLORIDE 0.9% FLUSH: 3.0000 mL | INTRAVENOUS | Status: DC | PRN

## 2016-08-05 MED ORDER — SODIUM CHLORIDE 0.9% FLUSH: 10.0000 mL | INTRAVENOUS | Status: DC | PRN

## 2016-08-05 NOTE — Anesthesia Post-op Follow-up Note (Cosign Needed)
Anesthesia QCDR form completed.        

## 2016-08-05 NOTE — Consult Note (Signed)
GI Inpatient Follow-up Note  Patient Identification: Shelly Arias is a 71 y.o. female with melena. EGD today was negative for source of bleeding. HGB and PLT stable.  Also h/x of epistaxis in the past week.   Subjective:  Scheduled Inpatient Medications:  . [MAR Hold] letrozole  2.5 mg Oral Daily  . magnesium citrate  1 Bottle Oral Once  . [MAR Hold] metoprolol succinate  25 mg Oral Daily  . [MAR Hold] pantoprazole (PROTONIX) IV  40 mg Intravenous Q12H  . [MAR Hold] pravastatin  40 mg Oral Daily    Continuous Inpatient Infusions:   . sodium chloride 20 mL/hr at 08/05/16 0854    PRN Inpatient Medications:  [MAR Hold] ALPRAZolam, [MAR Hold] docusate sodium, [MAR Hold] heparin lock flush, [MAR Hold] heparin lock flush, [MAR Hold] ondansetron, [MAR Hold] promethazine, [MAR Hold] sodium chloride flush, [MAR Hold] sodium chloride flush, [MAR Hold] sodium chloride flush, [MAR Hold] sodium chloride flush  Review of Systems:  Constitutional: Weight is stable.  Eyes: No changes in vision. ENT: No oral lesions, sore throat.  GI: see HPI.  Heme/Lymph: No easy bruising.  CV: No chest pain.  GU: No hematuria.  Integumentary: No rashes.  Neuro: No headaches.  Psych: No depression/anxiety.  Endocrine: No heat/cold intolerance.  Allergic/Immunologic: No urticaria.  Resp: No cough, SOB.  Musculoskeletal: No joint swelling.    Physical Examination: BP (!) 128/95   Pulse 80   Temp 97 F (36.1 C) (Tympanic)   Resp 20   Ht 5\' 1"  (1.549 m)   Wt 69.5 kg (153 lb 3.2 oz)   SpO2 100%   BMI 28.95 kg/m  Gen: NAD, alert and oriented x 4 HEENT: PEERLA, EOMI, Neck: supple, no JVD or thyromegaly Chest: CTA bilaterally, no wheezes, crackles, or other adventitious sounds CV: RRR, no m/g/c/r Abd: soft, NT, ND, +BS in all four quadrants; no HSM, guarding, ridigity, or rebound tenderness Ext: no edema Skin: no rash or lesions noted   Data: Lab Results  Component Value Date   WBC 4.3  08/05/2016   HGB 10.0 (L) 08/05/2016   HCT 27.9 (L) 08/05/2016   MCV 100.1 (H) 08/05/2016   PLT 62 (L) 08/05/2016    Recent Labs Lab 08/03/16 2149 08/04/16 0445 08/05/16 0738  HGB 8.2* 7.9* 10.0*   Lab Results  Component Value Date   NA 130 (L) 08/04/2016   K 4.0 08/04/2016   CL 98 (L) 08/04/2016   CO2 24 08/04/2016   BUN 9 08/04/2016   CREATININE 0.69 08/04/2016   Lab Results  Component Value Date   ALT 16 08/03/2016   AST 25 08/03/2016   ALKPHOS 64 08/03/2016   BILITOT 0.7 08/03/2016    Recent Labs Lab 08/03/16 1649  INR 1.03    Assessment/Plan: Shelly Arias is a 71 y.o. female melena and recent episode of epistaxis. Will obtain VCE in am to look for SB source.  If VCE negative suspect nose bleeds with underlying thrombocytopenia source of recent drop in HGB.   Recommendations:  Please call with questions or concerns.  San Jetty, MD

## 2016-08-05 NOTE — Anesthesia Postprocedure Evaluation (Signed)
Anesthesia Post Note  Patient: Shelly Arias  Procedure(s) Performed: Procedure(s) (LRB): ESOPHAGOGASTRODUODENOSCOPY (EGD) WITH PROPOFOL (N/A)  Patient location during evaluation: Endoscopy Anesthesia Type: General Level of consciousness: awake and alert Pain management: pain level controlled Vital Signs Assessment: post-procedure vital signs reviewed and stable Respiratory status: spontaneous breathing, nonlabored ventilation, respiratory function stable and patient connected to nasal cannula oxygen Cardiovascular status: blood pressure returned to baseline and stable Postop Assessment: no signs of nausea or vomiting Anesthetic complications: no     Last Vitals:  Vitals:   08/05/16 0956 08/05/16 1100  BP: (!) 153/71 (!) 144/76  Pulse: 82 90  Resp: (!) 27 18  Temp:  36.6 C    Last Pain:  Vitals:   08/05/16 1100  TempSrc: Oral  PainSc:                  [name] S

## 2016-08-05 NOTE — Op Note (Signed)
[facility] Gastroenterology Patient Name: Shelly Arias Procedure Date: 08/05/2016 8:37 AM MRN: [id number] Account #: [id number] Date of Birth: [birth year] Admit Type: Inpatient Age: 71 Room: [facility] ENDO ROOM 4 Gender: Female Note Status: Finalized Procedure:            Upper GI endoscopy Indications:          Melena. HGB 10.0 PLT 62. H/x of metastatic breast                        cancer with thrombocytopenia. H/x of epistaxis Providers:            San Jetty MD Medicines:            Propofol per Anesthesia Complications:        No immediate complications. Estimated blood loss:                        Minimal. Procedure:            Pre-Anesthesia Assessment:                       - Prior to the procedure, a History and Physical was                        performed, and patient medications and allergies were                        reviewed. The patient's tolerance of previous                        anesthesia was also reviewed. The risks and benefits of                        the procedure and the sedation options and risks were                        discussed with the patient. All questions were                        answered, and informed consent was obtained. Prior                        Anticoagulants: The patient has taken no previous                        anticoagulant or antiplatelet agents. ASA Grade                        Assessment: III - A patient with severe systemic                        disease. After reviewing the risks and benefits, the                        patient was deemed in satisfactory condition to undergo                        the procedure.                       After obtaining informed  consent, the endoscope was                        passed under direct vision. Throughout the procedure,                        the patient's blood pressure, pulse, and oxygen                        saturations were monitored continuously. The Endoscope                         was introduced through the mouth, and advanced to the                        second part of duodenum. The upper GI endoscopy was                        accomplished without difficulty. The patient tolerated                        the procedure well. Findings:      The hypopharynx was normal. Upon scope removal oral pharynx was blood       tinged most likely due to minor scope trauma      The examined esophagus was normal.      The entire examined stomach was normal.      The duodenal bulb and second portion of the duodenum were normal. Impression:           - Normal hypopharynx. Minor bleeding of oral pharynx                        upon scope withdrawal.                       - Normal esophagus.                       - Normal stomach.                       - Normal duodenal bulb and second portion of the                        duodenum.                       - No specimens collected. Recommendation:       - Patient has a contact number available for                        emergencies. The signs and symptoms of potential                        delayed complications were discussed with the patient.                        Return to normal activities tomorrow. Written discharge                        instructions were provided to the patient.                       -  Resume previous diet.                       - Continue present medications.                       - To visualize the small bowel, perform video capsule                        endoscopy tomorrow to look for SB source of bleed. Procedure Code(s):    --- Professional ---                       [phone], Esophagogastroduodenoscopy, flexible, transoral;                        diagnostic, including collection of specimen(s) by                        brushing or washing, when performed (separate procedure) Diagnosis Code(s):    --- Professional ---                       K92.1, Melena (includes Hematochezia) CPT  copyright 2016 American Medical Association. All rights reserved. The codes documented in this report are preliminary and upon coder review may  be revised to meet current compliance requirements. San Jetty, MD San Jetty MD,  08/05/2016 9:36:47 AM Number of Addenda: 0 Note Initiated On: 08/05/2016 8:37 AM Estimated Blood Loss: Estimated blood loss was minimal.      [facility]

## 2016-08-05 NOTE — Transfer of Care (Signed)
Immediate Anesthesia Transfer of Care Note  Patient: Shelly Arias  Procedure(s) Performed: Procedure(s): ESOPHAGOGASTRODUODENOSCOPY (EGD) WITH PROPOFOL (N/A)  Patient Location: PACU and Endoscopy Unit  Anesthesia Type:General  Level of Consciousness: sedated  Airway & Oxygen Therapy: Patient Spontanous Breathing and Patient connected to nasal cannula oxygen  Post-op Assessment: Report given to RN and Post -op Vital signs reviewed and stable  Post vital signs: Reviewed and stable  Last Vitals:  Vitals:   08/05/16 0926 08/05/16 0928  BP: (!) (P) 128/95 (!) 125/95  Pulse: (P) 83 83  Resp: (P) 20 20  Temp: (P) 36.1 C 46.6 C    Complications: No apparent anesthesia complications

## 2016-08-05 NOTE — Anesthesia Procedure Notes (Signed)
Date/Time: 08/05/2016 9:03 AM Performed by: Doreen Salvage Pre-anesthesia Checklist: Patient identified, Emergency Drugs available, Suction available and Patient being monitored Patient Re-evaluated:Patient Re-evaluated prior to inductionOxygen Delivery Method: Nasal cannula Intubation Type: IV induction Dental Injury: Teeth and Oropharynx as per pre-operative assessment  Comments: Nasal cannula with etCO2 monitoring

## 2016-08-05 NOTE — ED Provider Notes (Signed)
[facility] Emergency Department Provider Note  ____________________________________________   I have reviewed the triage vital signs and the nursing notes.   HISTORY  Chief Complaint Sore Throat and Post-op Problem    HPI Shelly Arias is a 71 y.o. female who is a history of low platelets, patient states that she had an endoscopy this morning which went fine and then she was discharged she went home and looked in the back of the throat and noted that there was some petechiae on her palate. This was of concern to her. So this reason she is here. She has no shortness of breath or sensation of swelling, she has no melena bright red blood per rectum or bleeding. She only has petechiae around her uvula which is not itself swollen. She has no other complaints. She would like to go home. She does not want to stay for blood work she just wants to have me look at the back of her throat and they show that this is a adamant that is not unexpected in a patient with low platelets. Because of her low platelets is not yet definitively known, but her platelets have been transfused this weekend and her last likely This when he was 50. This is up from her baseline. She has no other complaints she does not have a lot of pain in her throat and she would like to go home    Past Medical History:  Diagnosis Date  . Breast cancer ([place])    right, lumpectomy, radiation, chemo  . Breast cancer ([place])    Right, 2007  . 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  . Headache   . History of chemotherapy   . History of radiation therapy   . Hyperlipidemia   . Hypertension   . Osteoarthritis    right hip  . Osteoporosis   . Squamous cell carcinoma    leg, Followed by Dr. Nicole Kindred    Patient Active Problem List   Diagnosis Date Noted  . Acute blood loss anemia 08/04/2016  . GI bleed 08/03/2016  . Thrombocytopenia ([place]) 08/03/2016  . 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  Persistent headaches 07/02/2016  . Mass of upper inner quadrant of left breast 06/20/2016  . Carcinoma of overlapping sites of right breast in female, estrogen receptor positive ([place]) 06/08/2016  . Fatigue 06/08/2016  . Chronic fatigue 06/08/2016  . Nutritional anemia, unspecified 06/08/2016  . Prediabetes 01/25/2016  . Tobacco abuse 11/06/2012  . History of breast cancer 11/06/2012  . Osteopenia 11/08/2011  . Hyperlipidemia 11/02/2010  . Essential hypertension 12/23/2006  . GERD 12/23/2006    Past Surgical History:  Procedure Laterality Date  . ABDOMINAL HYSTERECTOMY    . BREAST BIOPSY Left 2002   left breast, calcifications  . BREAST BIOPSY Left 06/20/2016   INVASIVE DUCTAL CARCINOMA.  Marland Kitchen BREAST EXCISIONAL BIOPSY Right 2007   positive  . bunion repair    . left breast biopsy    . NASAL SINUS SURGERY    . right hip replacement    . SHOULDER SURGERY    . SQUAMOUS CELL CARCINOMA EXCISION     right leg, Dr. Nicole Kindred  . TOTAL HIP ARTHROPLASTY     right    Prior to Admission medications   Medication Sig Start Date End Date Taking? Authorizing Provider  ALPRAZolam Duanne Moron) 0.5 MG tablet Take 1 tablet (0.5 mg total) by mouth every 8 (eight) hours as needed for anxiety or sleep. 07/02/16  Yes [name], MD  Cholecalciferol (VITAMIN D3) 1000 units CAPS Take 1 capsule by mouth daily.    Yes [provider]  Fexofenadine HCl (ALLEGRA PO) Take 1 capsule by mouth.   Yes [provider]  letrozole (FEMARA) 2.5 MG tablet Take 1 tablet (2.5 mg total) by mouth daily. Once a day. 07/02/16  Yes [name], MD  metoprolol succinate (TOPROL-XL) 25 MG 24 hr tablet Take 1 tablet (25 mg total) by mouth daily. 05/11/16  Yes Cook, Jayce G, DO  ondansetron (ZOFRAN) 8 MG tablet Take 1 tablet (8 mg total) by mouth every 8 (eight) hours as needed for nausea or vomiting. 07/20/16  Yes [name], MD  pravastatin (PRAVACHOL) 40 MG tablet Take 1 tablet (40 mg  total) by mouth daily. 05/11/16  Yes Cook, Jayce G, DO  predniSONE (DELTASONE) 20 MG tablet 3 pills once a day; with breakfast; do not stop until instructed. 06/22/16  Yes [name], MD  Probiotic Product (PROBIOTIC & ACIDOPHILUS EX ST PO) Take 1 tablet by mouth daily.    Yes [provider]  promethazine (PHENERGAN) 25 MG tablet Take 1 tablet (25 mg total) by mouth every 6 (six) hours as needed for nausea or vomiting. 07/19/16  Yes [name], MD  triamcinolone ointment (KENALOG) 0.5 % Apply 1 application topically 2 (two) times daily. 07/30/16  Yes [name], MD  Abemaciclib 150 MG TABS Take 150 mg by mouth every 12 (twelve) hours. Patient not taking: Reported on 07/09/2016 07/02/16   [name], MD    Allergies Fish allergy; Morphine sulfate; and Oxycodone  Family History  Problem Relation Age of Onset  . Heart disease Father 76  . Heart disease Mother   . Hyperlipidemia Mother   . Heart disease Brother   . Diabetes Maternal Grandmother   . Breast cancer Cousin     Social History Social History  Substance Use Topics  . Smoking status: Current Some Day Smoker    Packs/day: 0.10    Years: 30.00    Types: Cigarettes  . Smokeless tobacco: Never Used  . Alcohol use 0.0 oz/week     Comment: socially    Review of Systems Constitutional: No fever/chills Eyes: No visual changes. ENT:See history of present illness regarding throat No stiff neck no neck pain Cardiovascular: Denies chest pain. Respiratory: Denies shortness of breath. Gastrointestinal:   no vomiting.  No diarrhea.  No constipation. Genitourinary: Negative for dysuria. Musculoskeletal: Negative lower extremity swelling Skin: Negative for rash. Neurological: Negative for severe headaches, focal weakness or numbness.   ____________________________________________   PHYSICAL EXAM:  VITAL SIGNS: ED Triage Vitals [08/05/16 1736]  Enc Vitals Group     BP (!) 169/78      Pulse Rate (!) 101     Resp 19     Temp 98.3 F (36.8 C)     Temp Source Oral     SpO2 98 %     Weight 153 lb (69.4 kg)     Height 5\' 1"  (1.549 m)     Head Circumference      Peak Flow      Pain Score      Pain Loc      Pain Edu?      Excl. in [place]?     Constitutional: Alert and oriented. Well appearing and in no acute distress. Eyes: Conjunctivae are normal Head: Atraumatic HEENT: No congestion/rhinnorhea. Mucous membranes are moist.  Oropharynx Shows  mild petechiae around the uvula but no swelling or erythema, she sees with a normal voice, there is no swelling around her under the tongue, there is no evidence of bleeding, there is normal voice with no stridor Neck:   Nontender with no meningismus, no masses, no stridor Cardiovascular: Normal rate, regular rhythm. Grossly normal heart sounds.  Good peripheral circulation. Respiratory: Normal respiratory effort.  No retractions. Lungs CTAB. Abdominal: Soft and nontender. No distention. No guarding no rebound Psychiatric: Mood and affect are normal. Speech and behavior are normal. Skin: Occasional petechia noted an occasional bruising noted none of which appear to be acute ____________________________________________   LABS (all labs ordered are listed, but only abnormal results are displayed)  Labs Reviewed - No data to display ____________________________________________  EKG  I personally interpreted any EKGs ordered by me or triage  ____________________________________________  RADIOLOGY  I reviewed any imaging ordered by me or triage that were performed during my shift and, if possible, patient and/or family made aware of any abnormal findings. ____________________________________________   PROCEDURES  Procedure(s) performed: None  Procedures  Critical Care performed: None  ____________________________________________   INITIAL IMPRESSION / ASSESSMENT AND PLAN / ED COURSE  Pertinent labs & imaging  results that were available during my care of the patient were reviewed by me and considered in my medical decision making (see chart for details).  Patient with known low platelets noted some petechiae her endoscopy this point. There is no evidence of impending or ongoing airway issue, no sensation or clinical evidence to support the diagnosis of airway hematoma that could cause her problem. She is only here because she did not expect to see platelets there. Patient has known platelets on her body and has had. I did discuss with her oncologist, Dr. Rogue Bussing, who saw the patient this morning and did look in her throat himself. He agrees with management. He does not feel she requires blood work or admission. Patient refuses both in any event. Extensive return precautions given should she have any swelling or significant difficulty breathing or bleeding or other concerns. Patient is very comfortable with this plan and is requesting discharge at this time. Return precautions understood. Follow-up understood    ____________________________________________   FINAL CLINICAL IMPRESSION(S) / ED DIAGNOSES  Final diagnoses:  None      This chart was dictated using voice recognition software.  Despite best efforts to proofread,  errors can occur which can change meaning.      Schuyler Amor, MD 08/05/16 [phone]

## 2016-08-05 NOTE — Discharge Instructions (Signed)
Return to the emergency room for any new or worrisome symptoms including swelling, difficulty breathing, shortness of breath, pain, bleeding or any other issues of concern. Follow closely with your primary care doctor and oncologist

## 2016-08-05 NOTE — ED Triage Notes (Signed)
Pt presents to ED c/o sore throat with back of throat "bloody and sore; it hurts really bad". Pt was discharged today post endoscopy and is concerned about the bleeding due to her having hx of low platelets and blood transfusions recenlty; questioning whether this is normal

## 2016-08-05 NOTE — Progress Notes (Signed)
Pt to be discharged today. Iv and tele removed. disch instructions given to pt and husband. disch via w.c. Accompanied byhusband

## 2016-08-05 NOTE — Anesthesia Preprocedure Evaluation (Signed)
Anesthesia Evaluation  Patient identified by MRN, date of birth, ID band Patient awake    Reviewed: Allergy & Precautions, NPO status , Patient's Chart, lab work & pertinent test results, reviewed documented beta blocker date and time   Airway Mallampati: II  TM Distance: >3 FB     Dental  (+) Chipped   Pulmonary Current Smoker,           Cardiovascular hypertension, Pt. on medications and Pt. on home beta blockers      Neuro/Psych  Headaches,    GI/Hepatic GERD  ,  Endo/Other    Renal/GU      Musculoskeletal  (+) Arthritis ,   Abdominal   Peds  Hematology  (+) anemia ,   Anesthesia Other Findings   Reproductive/Obstetrics                             Anesthesia Physical Anesthesia Plan  ASA: III  Anesthesia Plan: General   Post-op Pain Management:    Induction: Intravenous  Airway Management Planned:   Additional Equipment:   Intra-op Plan:   Post-operative Plan:   Informed Consent: I have reviewed the patients History and Physical, chart, labs and discussed the procedure including the risks, benefits and alternatives for the proposed anesthesia with the patient or authorized representative who has indicated his/her understanding and acceptance.     Plan Discussed with: CRNA  Anesthesia Plan Comments:         Anesthesia Quick Evaluation

## 2016-08-05 NOTE — ED Notes (Signed)
Spoke with Dr. Mable Paris for further orders or lab work: no further orders given. Pt then requested to sit in subwait due to being immunocompromised

## 2016-08-05 NOTE — Progress Notes (Signed)
Shelly Arias   DOB:July 02, 1945   DU#:[id number]    Subjective: Overnighted blood transfusion tolerated well. No black colored stools. No bloody nose. No nausea no vomiting. This morning patient had EGD. Tolerated the procedure well. Very anxious to go home.   Objective:  Vitals:   08/05/16 0946 08/05/16 0956  BP: (!) 132/96 (!) 153/71  Pulse: 85 82  Resp: 17 (!) 27  Temp:       Intake/Output Summary (Last 24 hours) at 08/05/16 1127 Last data filed at 08/05/16 0919  Gross per 24 hour  Intake             1080 ml  Output             1000 ml  Net               80 ml    GENERAL: Well-nourished well-developed; Alert, no distress and comfortable.   Accompanied by her husband. EYES: no pallor or icterus OROPHARYNX: no thrush or ulceration. NECK: supple, no masses felt LYMPH:  no palpable lymphadenopathy in the cervical, axillary or inguinal regions LUNGS: decreased breath sounds to auscultation at bases and  No wheeze or crackles HEART/CVS: regular rate & rhythm and no murmurs; No lower extremity edema ABDOMEN: abdomen soft, non-tender and normal bowel sounds Musculoskeletal:no cyanosis of digits and no clubbing  PSYCH: alert & oriented x 3 with fluent speech NEURO: no focal motor/sensory deficits SKIN:  Petechial rash noted bilateral lower extremities; and also the forehead [improved]   Labs:  Lab Results  Component Value Date   WBC 4.3 08/05/2016   HGB 10.0 (L) 08/05/2016   HCT 27.9 (L) 08/05/2016   MCV 100.1 (H) 08/05/2016   PLT 62 (L) 08/05/2016   NEUTROABS 2.6 08/05/2016    Lab Results  Component Value Date   NA 130 (L) 08/04/2016   K 4.0 08/04/2016   CL 98 (L) 08/04/2016   CO2 24 08/04/2016    Studies:  No results found.  Assessment & Plan:  # 71 year old female patient with recurrent metastatic breast cancer and also pancytopenia/severe thrombocytopenia is currently admitted to the hospital for melena/petechial rash.  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 Awaiting further workup next week at [facility]. Platelet status post transfusion on 5/25-platelets 62. Status post one unit of PRBC transfusion 5/26- hemoglobin 10.3. EGD this morning negative. Question small bowel source- GI recommends capsule study. Patient very anxious to go home. Given the most likely possibility of thrombocytopenia as a cause of her bleeding [question melanotic stools from swallow blood from epistaxis]- it is recommended to keep the platelets above 30. Discussed with GI- it's reasonable to discharge home without the capsule study. Patient in agreement  # Metastatic breast cancer ER/PR positive HER-2/neu negative the current- currently on letrozole. We'll hold CDK inhibitor in the context of above pancytopenia/thrombocytopenia.   # Discussed with GI Dr. Princella Ion; and also Dr. Posey Pronto. Patient will keep her appointments with [place] early next week as planned.    [name], MD 08/05/2016  11:27 AM

## 2016-08-05 NOTE — Discharge Summary (Signed)
Shelly Arias NAME: Shelly Arias    MR#:  [id number]  DATE OF BIRTH:  03-04-1946  DATE OF ADMISSION:  08/03/2016 ADMITTING PHYSICIAN: Vaughan Basta, MD  DATE OF DISCHARGE: 08/05/2016  PRIMARY CARE PHYSICIAN: Coral Spikes, DO    ADMISSION DIAGNOSIS:  Thrombocytopenia ([place]) [D69.6] Anemia, unspecified type [D64.9] GI bleed [K92.2]  DISCHARGE DIAGNOSIS:  Acute on chronic anemia---GI w/u in progress TCP s/p platelet transfusion SECONDARY DIAGNOSIS:   Past Medical History:  Diagnosis Date  . Breast cancer ([place])    right, lumpectomy, radiation, chemo  . Breast cancer ([place])    Right, 2007  . Breast cancer ([place]) 06/20/2016   INVASIVE DUCTAL CARCINOMA.  . Headache   . History of chemotherapy   . History of radiation therapy   . Hyperlipidemia   . Hypertension   . Osteoarthritis    right hip  . Osteoporosis   . Squamous cell carcinoma    leg, Followed by Dr. Alvie Heidelberg COURSE:  Shelly Arias a 71 y.o.femalewith a known history of Metastatic breast cancer, headache, hyperlipidemia, hypertension, osteoporosis, thrombocytopenia- for last 1 month she is diagnosed again to have metastatic breast cancer which she had been treated 11 years ago. She also noted to have low platelet count and at [facility] Dr.B is doing workup about that. Tried giving her platelet transfusion 2 weeks ago when she ended up having the rash is all over the body and wheezing and shortness of breath  * GI bleed acute onChronic anemia, hemoglobin is currently stable around her baseline. Received IV protonix Currently hemodynamically stable so no need for transfusion. GI consult appreciated---EGD negative -VCE as out pt if needed. DR B will take care of it at f/u if needed  * Thrombocytopenia Possibly secondary to underlying malignancy. She is being worked up at [facility]. Oncology consult appreciated.  plt 69K She had reaction to platelet transfusion 2 weeks ago  * Hypertension Continue home medication, stable.  Overall stable. Pt anxious to go home. Dr Rogue Bussing ok with d/c and out pt f/u  CONSULTS OBTAINED:  Treatment Team:  San Jetty, MD [name], MD  DRUG ALLERGIES:   Allergies  Allergen Reactions  . Fish Allergy   . Morphine Sulfate     REACTION: anaphylaxis  . Oxycodone     Nausea and vomiting    DISCHARGE MEDICATIONS:   Current Discharge Medication List    CONTINUE these medications which have NOT CHANGED   Details  Cholecalciferol (VITAMIN D3) 1000 units CAPS Take by mouth.   Associated Diagnoses: History of breast cancer; Breast cancer, female, right    Fexofenadine HCl (ALLEGRA PO) Take 1 capsule by mouth.    letrozole (FEMARA) 2.5 MG tablet Take 1 tablet (2.5 mg total) by mouth daily. Once a day. Qty: 90 tablet, Refills: 3    metoprolol succinate (TOPROL-XL) 25 MG 24 hr tablet Take 1 tablet (25 mg total) by mouth daily. Qty: 90 tablet, Refills: 3    pravastatin (PRAVACHOL) 40 MG tablet Take 1 tablet (40 mg total) by mouth daily. Qty: 90 tablet, Refills: 3    Probiotic Product (PROBIOTIC & ACIDOPHILUS EX ST PO) Take by mouth daily.    triamcinolone ointment (KENALOG) 0.5 % Apply 1 application topically 2 (two) times daily. Qty: 30 g, Refills: 0    Abemaciclib 150 MG TABS Take 150 mg by mouth every 12 (twelve) hours. Qty: 60 tablet, Refills: 6  ALPRAZolam (XANAX) 0.5 MG tablet Take 1 tablet (0.5 mg total) by mouth every 8 (eight) hours as needed for anxiety or sleep. Qty: 30 tablet, Refills: 2   Associated Diagnoses: Anxiety state    ondansetron (ZOFRAN) 8 MG tablet Take 1 tablet (8 mg total) by mouth every 8 (eight) hours as needed for nausea or vomiting. Qty: 20 tablet, Refills: 4   Associated Diagnoses: Carcinoma of overlapping sites of right breast in female, estrogen receptor positive ([place]); Nausea and vomiting,  intractability of vomiting not specified, unspecified vomiting type    predniSONE (DELTASONE) 20 MG tablet 3 pills once a day; with breakfast; do not stop until instructed. Qty: 50 tablet, Refills: 0   Associated Diagnoses: Carcinoma of overlapping sites of right breast in female, estrogen receptor positive ([place]); Thrombocytopenia ([facility])    promethazine (PHENERGAN) 25 MG tablet Take 1 tablet (25 mg total) by mouth every 6 (six) hours as needed for nausea or vomiting. Qty: 30 tablet, Refills: 3        If you experience worsening of your admission symptoms, develop shortness of breath, life threatening emergency, suicidal or homicidal thoughts you must seek medical attention immediately by calling 911 or calling your MD immediately  if symptoms less severe.  You Must read complete instructions/literature along with all the possible adverse reactions/side effects for all the Medicines you take and that have been prescribed to you. Take any new Medicines after you have completely understood and accept all the possible adverse reactions/side effects.   Please note  You were cared for by a hospitalist during your hospital stay. If you have any questions about your discharge medications or the care you received while you were in the hospital after you are discharged, you can call the unit and asked to speak with the hospitalist on call if the hospitalist that took care of you is not available. Once you are discharged, your primary care physician will handle any further medical issues. Please note that NO REFILLS for any discharge medications will be authorized once you are discharged, as it is imperative that you return to your primary care physician (or establish a relationship with a primary care physician if you do not have one) for your aftercare needs so that they can reassess your need for medications and monitor your lab values. Today   SUBJECTIVE   I am dying to go home  VITAL SIGNS:  Blood  pressure (!) 153/71, pulse 82, temperature 97 F (36.1 C), temperature source Tympanic, resp. rate (!) 27, height 5\' 1"  (1.549 m), weight 69.5 kg (153 lb 3.2 oz), SpO2 98 %.  I/O:   Intake/Output Summary (Last 24 hours) at 08/05/16 1058 Last data filed at 08/05/16 0919  Gross per 24 hour  Intake             1080 ml  Output             1000 ml  Net               80 ml    PHYSICAL EXAMINATION:  GENERAL:  71 y.o.-year-old patient lying in the bed with no acute distress.  EYES: Pupils equal, round, reactive to light and accommodation. No scleral icterus. Extraocular muscles intact.  HEENT: Head atraumatic, normocephalic. Oropharynx and nasopharynx clear.  NECK:  Supple, no jugular venous distention. No thyroid enlargement, no tenderness.  LUNGS: Normal breath sounds bilaterally, no wheezing, rales,rhonchi or crepitation. No use of accessory muscles of respiration.  CARDIOVASCULAR: S1, S2  normal. No murmurs, rubs, or gallops.  ABDOMEN: Soft, non-tender, non-distended. Bowel sounds present. No organomegaly or mass.  EXTREMITIES: No pedal edema, cyanosis, or clubbing.  NEUROLOGIC: Cranial nerves II through XII are intact. Muscle strength 5/5 in all extremities. Sensation intact. Gait not checked.  PSYCHIATRIC: The patient is alert and oriented x 3.  SKIN: No obvious rash, lesion, or ulcer.   DATA REVIEW:   CBC   Recent Labs Lab 08/05/16 0738  WBC 4.3  HGB 10.0*  HCT 27.9*  PLT 62*    Chemistries   Recent Labs Lab 08/03/16 1453 08/04/16 0445  NA 127* 130*  K 4.2 4.0  CL 94* 98*  CO2 26 24  GLUCOSE 141* 116*  BUN 13 9  CREATININE 0.83 0.69  CALCIUM 9.0 8.6*  AST 25  --   ALT 16  --   ALKPHOS 64  --   BILITOT 0.7  --     Microbiology Results   Recent Results (from the past 240 hour(s))  MRSA PCR Screening     Status: None   Collection Time: 08/03/16  8:49 PM  Result Value Ref Range Status   MRSA by PCR NEGATIVE NEGATIVE Final    Comment:        The GeneXpert  MRSA Assay (FDA approved for NASAL specimens only), is one component of a comprehensive MRSA colonization surveillance program. It is not intended to diagnose MRSA infection nor to guide or monitor treatment for MRSA infections.     RADIOLOGY:  No results found.   Management plans discussed with the patient, family and they are in agreement.  CODE STATUS:     Code Status Orders        Start     Ordered   08/03/16 2043  Full code  Continuous     08/03/16 2042    Code Status History    Date Active Date Inactive Code Status Order ID Comments User Context   This patient has a current code status but no historical code status.    Advance Directive Documentation     Most Recent Value  Type of Advance Directive  Living will  Pre-existing out of facility DNR order (yellow form or pink MOST form)  -  "MOST" Form in Place?  -      TOTAL TIME TAKING CARE OF THIS PATIENT: 40  minutes.    Kolleen Ochsner M.D on 08/05/2016 at 10:58 AM  Between 7am to 6pm - Pager - [phone] After 6pm go to [email or web address] - password [email or web address] [facility]  Office  [phone]  CC: Primary care physician; Coral Spikes, DO

## 2016-08-07 ENCOUNTER — Other Ambulatory Visit: Payer: Medicare Other

## 2016-08-07 ENCOUNTER — Inpatient Hospital Stay: Payer: Medicare Other

## 2016-08-07 ENCOUNTER — Encounter: Payer: Self-pay | Admitting: Gastroenterology

## 2016-08-07 ENCOUNTER — Ambulatory Visit: Payer: Medicare Other | Admitting: Internal Medicine

## 2016-08-07 DIAGNOSIS — Z9221 Personal history of antineoplastic chemotherapy: Secondary | ICD-10-CM | POA: Diagnosis not present

## 2016-08-07 DIAGNOSIS — D693 Immune thrombocytopenic purpura: Secondary | ICD-10-CM

## 2016-08-07 DIAGNOSIS — Z170 Estrogen receptor positive status [ER+]: Secondary | ICD-10-CM

## 2016-08-07 DIAGNOSIS — Z803 Family history of malignant neoplasm of breast: Secondary | ICD-10-CM | POA: Diagnosis not present

## 2016-08-07 DIAGNOSIS — Z79899 Other long term (current) drug therapy: Secondary | ICD-10-CM | POA: Diagnosis not present

## 2016-08-07 DIAGNOSIS — R040 Epistaxis: Secondary | ICD-10-CM | POA: Diagnosis not present

## 2016-08-07 DIAGNOSIS — M1990 Unspecified osteoarthritis, unspecified site: Secondary | ICD-10-CM | POA: Diagnosis not present

## 2016-08-07 DIAGNOSIS — E785 Hyperlipidemia, unspecified: Secondary | ICD-10-CM | POA: Diagnosis not present

## 2016-08-07 DIAGNOSIS — Z923 Personal history of irradiation: Secondary | ICD-10-CM | POA: Diagnosis not present

## 2016-08-07 DIAGNOSIS — M810 Age-related osteoporosis without current pathological fracture: Secondary | ICD-10-CM | POA: Diagnosis not present

## 2016-08-07 DIAGNOSIS — I10 Essential (primary) hypertension: Secondary | ICD-10-CM | POA: Diagnosis not present

## 2016-08-07 DIAGNOSIS — F17210 Nicotine dependence, cigarettes, uncomplicated: Secondary | ICD-10-CM | POA: Diagnosis not present

## 2016-08-07 DIAGNOSIS — C50811 Malignant neoplasm of overlapping sites of right female breast: Secondary | ICD-10-CM

## 2016-08-07 DIAGNOSIS — Z85828 Personal history of other malignant neoplasm of skin: Secondary | ICD-10-CM | POA: Diagnosis not present

## 2016-08-07 DIAGNOSIS — Z79811 Long term (current) use of aromatase inhibitors: Secondary | ICD-10-CM | POA: Diagnosis not present

## 2016-08-07 DIAGNOSIS — R918 Other nonspecific abnormal finding of lung field: Secondary | ICD-10-CM | POA: Diagnosis not present

## 2016-08-07 DIAGNOSIS — D61818 Other pancytopenia: Secondary | ICD-10-CM | POA: Diagnosis not present

## 2016-08-07 DIAGNOSIS — R233 Spontaneous ecchymoses: Secondary | ICD-10-CM | POA: Diagnosis not present

## 2016-08-07 DIAGNOSIS — Z7952 Long term (current) use of systemic steroids: Secondary | ICD-10-CM | POA: Diagnosis not present

## 2016-08-07 LAB — CBC WITH DIFFERENTIAL/PLATELET
Basophils Absolute: 0 10*3/uL (ref 0–0.1)
Basophils Relative: 0 %
Eosinophils Absolute: 0 10*3/uL (ref 0–0.7)
Eosinophils Relative: 1 %
HEMATOCRIT: 27.5 % — AB (ref 35.0–47.0)
Hemoglobin: 10 g/dL — ABNORMAL LOW (ref 12.0–16.0)
LYMPHS ABS: 1.1 10*3/uL (ref 1.0–3.6)
LYMPHS PCT: 45 %
MCH: 36.2 pg — AB (ref 26.0–34.0)
MCHC: 36.2 g/dL — ABNORMAL HIGH (ref 32.0–36.0)
MCV: 100 fL (ref 80.0–100.0)
Monocytes Absolute: 0.3 10*3/uL (ref 0.2–0.9)
Monocytes Relative: 11 %
Neutro Abs: 1.1 10*3/uL — ABNORMAL LOW (ref 1.4–6.5)
Neutrophils Relative %: 43 %
PLATELETS: 48 10*3/uL — AB (ref 150–440)
RBC: 2.75 MIL/uL — AB (ref 3.80–5.20)
RDW: 21.2 % — ABNORMAL HIGH (ref 11.5–14.5)
WBC: 2.5 10*3/uL — AB (ref 3.6–11.0)

## 2016-08-07 LAB — SAMPLE TO BLOOD BANK

## 2016-08-07 MED ORDER — ROMIPLOSTIM 250 MCG ~~LOC~~ SOLR
3.0000 ug/kg | Freq: Once | SUBCUTANEOUS | Status: AC
  Administered 2016-08-07: 210 ug via SUBCUTANEOUS
  Filled 2016-08-07: qty 0.42

## 2016-08-07 NOTE — Telephone Encounter (Signed)
Colette, Please contact pt with this information

## 2016-08-08 ENCOUNTER — Inpatient Hospital Stay (HOSPITAL_BASED_OUTPATIENT_CLINIC_OR_DEPARTMENT_OTHER): Payer: Medicare Other | Admitting: Internal Medicine

## 2016-08-08 VITALS — BP 134/86 | HR 108 | Temp 98.1°F | Resp 18 | Wt 154.4 lb

## 2016-08-08 DIAGNOSIS — Z170 Estrogen receptor positive status [ER+]: Secondary | ICD-10-CM

## 2016-08-08 DIAGNOSIS — D693 Immune thrombocytopenic purpura: Secondary | ICD-10-CM | POA: Diagnosis not present

## 2016-08-08 DIAGNOSIS — D61818 Other pancytopenia: Secondary | ICD-10-CM | POA: Diagnosis not present

## 2016-08-08 DIAGNOSIS — Z79899 Other long term (current) drug therapy: Secondary | ICD-10-CM | POA: Diagnosis not present

## 2016-08-08 DIAGNOSIS — E785 Hyperlipidemia, unspecified: Secondary | ICD-10-CM

## 2016-08-08 DIAGNOSIS — R040 Epistaxis: Secondary | ICD-10-CM

## 2016-08-08 DIAGNOSIS — Z9221 Personal history of antineoplastic chemotherapy: Secondary | ICD-10-CM

## 2016-08-08 DIAGNOSIS — Z85828 Personal history of other malignant neoplasm of skin: Secondary | ICD-10-CM

## 2016-08-08 DIAGNOSIS — Z7952 Long term (current) use of systemic steroids: Secondary | ICD-10-CM

## 2016-08-08 DIAGNOSIS — E233 Hypothalamic dysfunction, not elsewhere classified: Secondary | ICD-10-CM

## 2016-08-08 DIAGNOSIS — Z923 Personal history of irradiation: Secondary | ICD-10-CM

## 2016-08-08 DIAGNOSIS — C50811 Malignant neoplasm of overlapping sites of right female breast: Secondary | ICD-10-CM | POA: Diagnosis not present

## 2016-08-08 DIAGNOSIS — M810 Age-related osteoporosis without current pathological fracture: Secondary | ICD-10-CM | POA: Diagnosis not present

## 2016-08-08 DIAGNOSIS — Z803 Family history of malignant neoplasm of breast: Secondary | ICD-10-CM

## 2016-08-08 DIAGNOSIS — R918 Other nonspecific abnormal finding of lung field: Secondary | ICD-10-CM

## 2016-08-08 DIAGNOSIS — Z79811 Long term (current) use of aromatase inhibitors: Secondary | ICD-10-CM

## 2016-08-08 DIAGNOSIS — F17210 Nicotine dependence, cigarettes, uncomplicated: Secondary | ICD-10-CM

## 2016-08-08 DIAGNOSIS — I10 Essential (primary) hypertension: Secondary | ICD-10-CM

## 2016-08-08 DIAGNOSIS — M1990 Unspecified osteoarthritis, unspecified site: Secondary | ICD-10-CM

## 2016-08-08 NOTE — Progress Notes (Signed)
[place] OFFICE PROGRESS NOTE  Patient Care Team: Coral Spikes, DO as PCP - General (Family Medicine) [name], DPM as Consulting Physician (Podiatry) [name], MD as Consulting Physician (Internal Medicine) Robert Bellow, MD (General Surgery)   SUMMARY OF ONCOLOGIC HISTORY:  Oncology History   # April 2018- Left chest wall Bx- IMC; ER-PR-POS; her 2 Neu NEG; PET- mild hilar/distal adenopathy; ~1 cm right lung nodule/ several sub-centimeter.   # May 2018[facility]; Abemacliclib [on HOLD sec to cytopenia]  # April 2017-isolated Thrombocytopenia- platelets- 113; worsening PANCYTOPENIA- April 2018- BMBx- no evidence of malignancy; MDS/Acute leuk; Karyotype/MDS- FISH panel-NEG [d/w Dr.Smir];   # 2006- RIGHT BREAST CA [pT1cN0M0; STAGE I; ER/PRPos; Her 2 Neu-NEG] s/p FEC x 6 [NSABP B-36]; Femara [Dec 2007-2012]  # Osteoporosis s/p Reclast; BMD- 2015-wnl- ca+vit D         Carcinoma of overlapping sites of right breast in female, estrogen receptor positive ([place])     INTERVAL HISTORY:  A very pleasant 71 year old female patient With newly diagnosed recurrent breast cancer- ER/PR positive HER-2/neu negative currently on Femara; CDK inhibitor [on hold] is here to review the results of her blood work/declining platelets.  In the interim patient was admitted to the hospital- for possible GI bleed melanotic stools- EGD negative for any bleeding. Patient was recommended Capsule study; however wanted to go home. Patient's platelets were down to 20,000; patient received platelet transfusion without any transfusion reaction after she was premedicated. She also received 1 unit of blood transfusion for hemoglobin of 7.9.   Patient was again in the emergency room over the weekend- for a petechial rash noted in the inside of the mouth. Since likely attributed to the recent EGD. Denies any nosebleeds.   Patient denies any unusual shortness of breath or chest  pain or cough. No weight loss. Appetite is good.  Denies any new medications.   REVIEW OF SYSTEMS:  A complete 10 point review of system is done which is negative except mentioned above/history of present illness.   PAST MEDICAL HISTORY :  Past Medical History:  Diagnosis Date  . Breast cancer ([place])    right, lumpectomy, radiation, chemo  . Breast cancer ([place])    Right, 2007  . Breast cancer ([place]) 06/20/2016   INVASIVE DUCTAL CARCINOMA.  . Headache   . History of chemotherapy   . History of radiation therapy   . Hyperlipidemia   . Hypertension   . Osteoarthritis    right hip  . Osteoporosis   . Squamous cell carcinoma    leg, Followed by Dr. Nicole Kindred    PAST SURGICAL HISTORY :   Past Surgical History:  Procedure Laterality Date  . ABDOMINAL HYSTERECTOMY    . BREAST BIOPSY Left 2002   left breast, calcifications  . BREAST BIOPSY Left 06/20/2016   INVASIVE DUCTAL CARCINOMA.  Marland Kitchen BREAST EXCISIONAL BIOPSY Right 2007   positive  . bunion repair    . ESOPHAGOGASTRODUODENOSCOPY (EGD) WITH PROPOFOL N/A 08/05/2016   Procedure: ESOPHAGOGASTRODUODENOSCOPY (EGD) WITH PROPOFOL;  Surgeon: San Jetty, MD;  Location: [facility] ENDOSCOPY;  Service: General;  Laterality: N/A;  . left breast biopsy    . NASAL SINUS SURGERY    . right hip replacement    . SHOULDER SURGERY    . SQUAMOUS CELL CARCINOMA EXCISION     right leg, Dr. Nicole Kindred  . TOTAL HIP ARTHROPLASTY     right    FAMILY HISTORY :   Family History  Problem Relation Age of Onset  . Heart disease Father 72  . Heart disease Mother   . Hyperlipidemia Mother   . Heart disease Brother   . Diabetes Maternal Grandmother   . Breast cancer Cousin     SOCIAL HISTORY:   Social History  Substance Use Topics  . Smoking status: Current Some Day Smoker    Packs/day: 0.10    Years: 30.00    Types: Cigarettes  . Smokeless tobacco: Never Used  . Alcohol use 0.0 oz/week     Comment: socially    ALLERGIES:  is allergic to fish  allergy; morphine sulfate; and oxycodone.  MEDICATIONS:  Current Outpatient Prescriptions  Medication Sig Dispense Refill  . ALPRAZolam (XANAX) 0.5 MG tablet Take 1 tablet (0.5 mg total) by mouth every 8 (eight) hours as needed for anxiety or sleep. 30 tablet 2  . Cholecalciferol (VITAMIN D3) 1000 units CAPS Take 1 capsule by mouth daily.     Marland Kitchen Fexofenadine HCl (ALLEGRA PO) Take 1 capsule by mouth.    . letrozole (FEMARA) 2.5 MG tablet Take 1 tablet (2.5 mg total) by mouth daily. Once a day. 90 tablet 3  . metoprolol succinate (TOPROL-XL) 25 MG 24 hr tablet Take 1 tablet (25 mg total) by mouth daily. 90 tablet 3  . ondansetron (ZOFRAN) 8 MG tablet Take 1 tablet (8 mg total) by mouth every 8 (eight) hours as needed for nausea or vomiting. 20 tablet 4  . pravastatin (PRAVACHOL) 40 MG tablet Take 1 tablet (40 mg total) by mouth daily. 90 tablet 3  . Probiotic Product (PROBIOTIC & ACIDOPHILUS EX ST PO) Take 1 tablet by mouth daily.     Marland Kitchen triamcinolone ointment (KENALOG) 0.5 % Apply 1 application topically 2 (two) times daily. 30 g 0  . Abemaciclib 150 MG TABS Take 150 mg by mouth every 12 (twelve) hours. (Patient not taking: Reported on 07/09/2016) 60 tablet 6   No current facility-administered medications for this visit.     PHYSICAL EXAMINATION: ECOG PERFORMANCE STATUS: 0 - Asymptomatic  BP 134/86 (BP Location: Left Arm, Patient Position: Sitting)   Pulse (!) 108   Temp 98.1 F (36.7 C) (Tympanic)   Resp 18   Wt 154 lb 6 oz (70 kg)   BMI 29.17 kg/m   Filed Weights   08/08/16 1115  Weight: 154 lb 6 oz (70 kg)    GENERAL: Well-nourished well-developed; Alert, no distress and comfortable.  Accompanied by Her husband. EYES: WNL.  OROPHARYNX: no thrush or ulceration; good dentition. Minute petechia noted on the posterior pharyngeal wall.  NECK: supple, no masses felt LYMPH:  no palpable lymphadenopathy in the cervical, axillary or inguinal regions LUNGS: clear to auscultation and  No  wheeze or crackles HEART/CVS: regular rate & rhythm and no murmurs; No lower extremity edema ABDOMEN:abdomen soft, non-tender and normal bowel sounds Musculoskeletal:no cyanosis of digits and no clubbing  PSYCH: alert & oriented x 3 with fluent speech NEURO: no focal motor/sensory deficits SKIN:  Bruising noted at the site of the biopsy. Petechial rash on the lower extremity is improved.   LABORATORY DATA:  I have reviewed the data as listed    Component Value Date/Time   NA 130 (L) 08/04/2016 0445   K 4.0 08/04/2016 0445   CL 98 (L) 08/04/2016 0445   CO2 24 08/04/2016 0445   GLUCOSE 116 (H) 08/04/2016 0445   BUN 9 08/04/2016 0445   CREATININE 0.69 08/04/2016 0445   CREATININE 0.67 07/09/2014 1321  CREATININE 0.70 04/30/2014 1455   CALCIUM 8.6 (L) 08/04/2016 0445   PROT 8.0 08/03/2016 1453   PROT 7.3 07/09/2014 1321   ALBUMIN 3.7 08/03/2016 1453   ALBUMIN 4.3 07/09/2014 1321   AST 25 08/03/2016 1453   AST 19 07/09/2014 1321   ALT 16 08/03/2016 1453   ALT 18 07/09/2014 1321   ALKPHOS 64 08/03/2016 1453   ALKPHOS 66 07/09/2014 1321   BILITOT 0.7 08/03/2016 1453   BILITOT 0.6 07/09/2014 1321   GFRNONAA >60 08/04/2016 0445   GFRNONAA >60 07/09/2014 1321   GFRAA >60 08/04/2016 0445   GFRAA >60 07/09/2014 1321    No results found for: SPEP, UPEP  Lab Results  Component Value Date   WBC 2.5 (L) 08/07/2016   NEUTROABS 1.1 (L) 08/07/2016   HGB 10.0 (L) 08/07/2016   HCT 27.5 (L) 08/07/2016   MCV 100.0 08/07/2016   PLT 48 (L) 08/07/2016      Chemistry      Component Value Date/Time   NA 130 (L) 08/04/2016 0445   K 4.0 08/04/2016 0445   CL 98 (L) 08/04/2016 0445   CO2 24 08/04/2016 0445   BUN 9 08/04/2016 0445   CREATININE 0.69 08/04/2016 0445   CREATININE 0.67 07/09/2014 1321   CREATININE 0.70 04/30/2014 1455      Component Value Date/Time   CALCIUM 8.6 (L) 08/04/2016 0445   ALKPHOS 64 08/03/2016 1453   ALKPHOS 66 07/09/2014 1321   AST 25 08/03/2016 1453    AST 19 07/09/2014 1321   ALT 16 08/03/2016 1453   ALT 18 07/09/2014 1321   BILITOT 0.7 08/03/2016 1453   BILITOT 0.6 07/09/2014 1321        ASSESSMENT & PLAN:   Carcinoma of overlapping sites of right breast in female, estrogen receptor positive ([place]) #  Stage IV recurrent/metastatic. Left chest wall/upper outer breast nodule biopsy- positive for invasive ductal carcinoma- ER/PR positive HER-2/neu negative breast cancer. Bx of right chest wall- positive for [facility] ER/PR-pos; Her-2 neu-NEG.  continue Femara at this time. Continue to hold Abemaciclib at this time given severe thrombocyopenia [see discussion below]  # Severe thrombocytopenia/leukopenia/anemia- highly suspicious for MDS; however recent bone marrow biopsy negative. Awaiting repeat bone marrow biopsy at [facility] on May 31st; appointment by Dr. Royce Macadamia on June 6. Continue N-plate [weekly; last received on 5/29th-platelets 48].  # Recent admission for melanotic stools- question GI bleed versus swallowed blood from epistaxis. EGD negative. Recommend keeping the platelets above 30,000.   # Recommend blood check on June 1/ possible platelet transfusion.      [name], MD 08/08/2016 7:51 PM

## 2016-08-08 NOTE — Progress Notes (Signed)
Patient here today for follow up.   

## 2016-08-08 NOTE — Assessment & Plan Note (Addendum)
#  Stage IV recurrent/metastatic. Left chest wall/upper outer breast nodule biopsy- positive for invasive ductal carcinoma- ER/PR positive HER-2/neu negative breast cancer. Bx of right chest wall- positive for [facility]; ER/PR-pos; Her-2 neu-NEG.  continue Femara at this time. Continue to hold Abemaciclib at this time given severe thrombocyopenia [see discussion below]  # Severe thrombocytopenia/leukopenia/anemia- highly suspicious for MDS; however recent bone marrow biopsy negative. Awaiting repeat bone marrow biopsy at [facility] on May 31st; appointment by Dr. Royce Macadamia on June 6. Continue N-plate [weekly; last received on 5/29th-platelets 48].  # Recent admission for melanotic stools- question GI bleed versus swallowed blood from epistaxis. EGD negative. Recommend keeping the platelets above 30,000.   # Recommend blood check on June 1/ possible platelet transfusion.

## 2016-08-09 ENCOUNTER — Telehealth: Payer: Self-pay | Admitting: *Deleted

## 2016-08-09 DIAGNOSIS — D469 Myelodysplastic syndrome, unspecified: Secondary | ICD-10-CM | POA: Diagnosis not present

## 2016-08-09 DIAGNOSIS — D61818 Other pancytopenia: Secondary | ICD-10-CM | POA: Diagnosis not present

## 2016-08-09 NOTE — Telephone Encounter (Signed)
Notified [facility] that we are currently holding pt's Abemaciclib 150 MG at this time and will notify them when/if we restart the her on the medication.

## 2016-08-10 ENCOUNTER — Other Ambulatory Visit: Payer: Self-pay

## 2016-08-10 ENCOUNTER — Inpatient Hospital Stay: Payer: Medicare Other

## 2016-08-10 ENCOUNTER — Inpatient Hospital Stay: Payer: Medicare Other | Attending: Internal Medicine

## 2016-08-10 ENCOUNTER — Other Ambulatory Visit: Payer: Self-pay | Admitting: Internal Medicine

## 2016-08-10 DIAGNOSIS — Z9221 Personal history of antineoplastic chemotherapy: Secondary | ICD-10-CM | POA: Diagnosis not present

## 2016-08-10 DIAGNOSIS — Z923 Personal history of irradiation: Secondary | ICD-10-CM | POA: Diagnosis not present

## 2016-08-10 DIAGNOSIS — M1990 Unspecified osteoarthritis, unspecified site: Secondary | ICD-10-CM | POA: Diagnosis not present

## 2016-08-10 DIAGNOSIS — E538 Deficiency of other specified B group vitamins: Secondary | ICD-10-CM | POA: Diagnosis not present

## 2016-08-10 DIAGNOSIS — R5383 Other fatigue: Secondary | ICD-10-CM | POA: Diagnosis not present

## 2016-08-10 DIAGNOSIS — C50811 Malignant neoplasm of overlapping sites of right female breast: Secondary | ICD-10-CM | POA: Insufficient documentation

## 2016-08-10 DIAGNOSIS — R531 Weakness: Secondary | ICD-10-CM | POA: Insufficient documentation

## 2016-08-10 DIAGNOSIS — I10 Essential (primary) hypertension: Secondary | ICD-10-CM | POA: Diagnosis not present

## 2016-08-10 DIAGNOSIS — R609 Edema, unspecified: Secondary | ICD-10-CM | POA: Insufficient documentation

## 2016-08-10 DIAGNOSIS — Z79899 Other long term (current) drug therapy: Secondary | ICD-10-CM | POA: Insufficient documentation

## 2016-08-10 DIAGNOSIS — D693 Immune thrombocytopenic purpura: Secondary | ICD-10-CM

## 2016-08-10 DIAGNOSIS — F17210 Nicotine dependence, cigarettes, uncomplicated: Secondary | ICD-10-CM | POA: Insufficient documentation

## 2016-08-10 DIAGNOSIS — E785 Hyperlipidemia, unspecified: Secondary | ICD-10-CM | POA: Diagnosis not present

## 2016-08-10 DIAGNOSIS — Z170 Estrogen receptor positive status [ER+]: Secondary | ICD-10-CM | POA: Diagnosis not present

## 2016-08-10 DIAGNOSIS — D61818 Other pancytopenia: Secondary | ICD-10-CM | POA: Insufficient documentation

## 2016-08-10 LAB — CBC WITH DIFFERENTIAL/PLATELET
BASOS ABS: 0 10*3/uL (ref 0–0.1)
Basophils Relative: 0 %
Eosinophils Absolute: 0 10*3/uL (ref 0–0.7)
Eosinophils Relative: 1 %
HEMATOCRIT: 27.8 % — AB (ref 35.0–47.0)
Hemoglobin: 10 g/dL — ABNORMAL LOW (ref 12.0–16.0)
LYMPHS PCT: 46 %
Lymphs Abs: 1.1 10*3/uL (ref 1.0–3.6)
MCH: 36.3 pg — ABNORMAL HIGH (ref 26.0–34.0)
MCHC: 36 g/dL (ref 32.0–36.0)
MCV: 101 fL — AB (ref 80.0–100.0)
Monocytes Absolute: 0.2 10*3/uL (ref 0.2–0.9)
Monocytes Relative: 9 %
NEUTROS ABS: 1 10*3/uL — AB (ref 1.4–6.5)
Neutrophils Relative %: 44 %
Platelets: 30 10*3/uL — ABNORMAL LOW (ref 150–440)
RBC: 2.75 MIL/uL — AB (ref 3.80–5.20)
RDW: 20.8 % — ABNORMAL HIGH (ref 11.5–14.5)
WBC: 2.3 10*3/uL — AB (ref 3.6–11.0)

## 2016-08-10 MED ORDER — DIPHENHYDRAMINE HCL 25 MG PO CAPS
25.0000 mg | ORAL_CAPSULE | Freq: Once | ORAL | Status: AC
  Administered 2016-08-10: 25 mg via ORAL
  Filled 2016-08-10: qty 1

## 2016-08-10 MED ORDER — SODIUM CHLORIDE 0.9 % IV SOLN
250.0000 mL | Freq: Once | INTRAVENOUS | Status: AC
  Administered 2016-08-10: 250 mL via INTRAVENOUS
  Filled 2016-08-10: qty 250

## 2016-08-10 MED ORDER — ACETAMINOPHEN 325 MG PO TABS
650.0000 mg | ORAL_TABLET | Freq: Once | ORAL | Status: AC
  Administered 2016-08-10: 650 mg via ORAL
  Filled 2016-08-10: qty 2

## 2016-08-10 MED ORDER — SODIUM CHLORIDE 0.9% FLUSH
3.0000 mL | INTRAVENOUS | Status: AC | PRN
  Administered 2016-08-10: 3 mL
  Filled 2016-08-10: qty 3

## 2016-08-10 MED ORDER — METHYLPREDNISOLONE SODIUM SUCC 125 MG IJ SOLR
40.0000 mg | Freq: Once | INTRAMUSCULAR | Status: AC
  Administered 2016-08-10: 40 mg via INTRAVENOUS
  Filled 2016-08-10: qty 2

## 2016-08-11 LAB — BPAM PLATELET PHERESIS
Blood Product Expiration Date: 201806022359
ISSUE DATE / TIME: 201806011347
UNIT TYPE AND RH: 6200

## 2016-08-11 LAB — PREPARE PLATELET PHERESIS: Unit division: 0

## 2016-08-13 ENCOUNTER — Telehealth: Payer: Self-pay | Admitting: *Deleted

## 2016-08-13 NOTE — Telephone Encounter (Signed)
Rn spoke with Dr. Rogue Bussing on 08/10/16 - orders obtained for future apts (Tuesday 6/5 and Thursday l 6/7ab/ poss. plt infusion) and lab/md (Dr. .  standing orders. Infuse plts if less than 35. - premeds: tylenol/benadryl and methylprednisolone 612 per md. And lab/plts on 6/14.

## 2016-08-14 ENCOUNTER — Inpatient Hospital Stay: Payer: Medicare Other | Admitting: *Deleted

## 2016-08-14 ENCOUNTER — Other Ambulatory Visit: Payer: Self-pay | Admitting: *Deleted

## 2016-08-14 ENCOUNTER — Inpatient Hospital Stay: Payer: Medicare Other

## 2016-08-14 ENCOUNTER — Encounter: Payer: Self-pay | Admitting: *Deleted

## 2016-08-14 DIAGNOSIS — Z170 Estrogen receptor positive status [ER+]: Secondary | ICD-10-CM | POA: Diagnosis not present

## 2016-08-14 DIAGNOSIS — D696 Thrombocytopenia, unspecified: Secondary | ICD-10-CM

## 2016-08-14 DIAGNOSIS — D693 Immune thrombocytopenic purpura: Secondary | ICD-10-CM

## 2016-08-14 DIAGNOSIS — D61818 Other pancytopenia: Secondary | ICD-10-CM | POA: Diagnosis not present

## 2016-08-14 DIAGNOSIS — Z79899 Other long term (current) drug therapy: Secondary | ICD-10-CM | POA: Diagnosis not present

## 2016-08-14 DIAGNOSIS — C50811 Malignant neoplasm of overlapping sites of right female breast: Secondary | ICD-10-CM | POA: Diagnosis not present

## 2016-08-14 DIAGNOSIS — R609 Edema, unspecified: Secondary | ICD-10-CM | POA: Diagnosis not present

## 2016-08-14 DIAGNOSIS — E538 Deficiency of other specified B group vitamins: Secondary | ICD-10-CM | POA: Diagnosis not present

## 2016-08-14 LAB — CBC WITH DIFFERENTIAL/PLATELET
Basophils Absolute: 0 10*3/uL (ref 0–0.1)
Basophils Relative: 0 %
EOS PCT: 1 %
Eosinophils Absolute: 0 10*3/uL (ref 0–0.7)
HCT: 27.5 % — ABNORMAL LOW (ref 35.0–47.0)
Hemoglobin: 9.7 g/dL — ABNORMAL LOW (ref 12.0–16.0)
LYMPHS ABS: 1.3 10*3/uL (ref 1.0–3.6)
Lymphocytes Relative: 42 %
MCH: 36.3 pg — AB (ref 26.0–34.0)
MCHC: 35.5 g/dL (ref 32.0–36.0)
MCV: 102.2 fL — AB (ref 80.0–100.0)
MONO ABS: 0.2 10*3/uL (ref 0.2–0.9)
MONOS PCT: 7 %
Neutro Abs: 1.6 10*3/uL (ref 1.4–6.5)
Neutrophils Relative %: 50 %
PLATELETS: 26 10*3/uL — AB (ref 150–400)
RBC: 2.69 MIL/uL — AB (ref 3.80–5.20)
RDW: 20.9 % — AB (ref 11.5–14.5)
WBC: 3.2 10*3/uL — ABNORMAL LOW (ref 3.6–11.0)

## 2016-08-14 NOTE — Progress Notes (Signed)
Spoke with blood bank. Notified blood bank- pt to receive plt infusion tomorrow.- 1431 08/14/16

## 2016-08-14 NOTE — Progress Notes (Signed)
@  1:46 PM 08/14/2016 - call report from c ctr lab dept - plt count 26 read back process performed with Lonnie in cancer center.   On call provider - Dr. Mike Gip made aware at 1400. Read back process.   plt infusion was scheduled today at 130 pm; however, given the patient's apt time, not able to proceed with plts in cancer. plt blood component will not arrive for approximately 2 hrs.  Pt is asymptomatic at this time. No signs of bleeding.  Discussed with patient the following options: 1. proceed with plt infusion today after cancer ctr clinic hours in either SDS or in inpt setting; 2. Proceed with infusion at 830 am tomorrow morning in cancer center.   Pt states that her son is traveling in from out of state today and she has to be ready for him by 5pm, therefore, she could not stay today for IV infusion of plts.  Pt elected not to proceed with infusion today for this reason; but agreed to come at 830am tomorrow.  V/o for plt infusion and premeds entered under Dr. Kem Parkinson name and released. Blood bank notified that infusion will be at 830am.  Dr. Mike Gip made aware of pt's decision to wait until tomorrow for plt infusion.  Pt did voice new c/o a "knot on my left leg above my ankle." Evidence of mild swelling present. No signs of bruising or discoloration. Pt does not have any pain in the back of her calf. (neg. homans' sign).  Dr. Mike Gip evaluated the area of concern. Pt instructed by MD to apply ice as needed to the left leg. At this time, pt will inform cancer center if 'knot' does not improve. Per md, no need for U/s doppler of lower ext- at this time.

## 2016-08-15 ENCOUNTER — Inpatient Hospital Stay: Payer: Medicare Other

## 2016-08-15 ENCOUNTER — Telehealth: Payer: Self-pay | Admitting: *Deleted

## 2016-08-15 DIAGNOSIS — Z803 Family history of malignant neoplasm of breast: Secondary | ICD-10-CM | POA: Diagnosis not present

## 2016-08-15 DIAGNOSIS — C539 Malignant neoplasm of cervix uteri, unspecified: Secondary | ICD-10-CM | POA: Diagnosis not present

## 2016-08-15 DIAGNOSIS — E538 Deficiency of other specified B group vitamins: Secondary | ICD-10-CM | POA: Diagnosis not present

## 2016-08-15 DIAGNOSIS — Z170 Estrogen receptor positive status [ER+]: Secondary | ICD-10-CM | POA: Diagnosis not present

## 2016-08-15 DIAGNOSIS — M810 Age-related osteoporosis without current pathological fracture: Secondary | ICD-10-CM | POA: Diagnosis not present

## 2016-08-15 DIAGNOSIS — D469 Myelodysplastic syndrome, unspecified: Secondary | ICD-10-CM | POA: Diagnosis not present

## 2016-08-15 DIAGNOSIS — C50811 Malignant neoplasm of overlapping sites of right female breast: Secondary | ICD-10-CM | POA: Diagnosis not present

## 2016-08-15 DIAGNOSIS — D6959 Other secondary thrombocytopenia: Secondary | ICD-10-CM | POA: Diagnosis not present

## 2016-08-15 DIAGNOSIS — Z836 Family history of other diseases of the respiratory system: Secondary | ICD-10-CM | POA: Diagnosis not present

## 2016-08-15 DIAGNOSIS — I10 Essential (primary) hypertension: Secondary | ICD-10-CM | POA: Diagnosis not present

## 2016-08-15 DIAGNOSIS — Z833 Family history of diabetes mellitus: Secondary | ICD-10-CM | POA: Diagnosis not present

## 2016-08-15 DIAGNOSIS — K921 Melena: Secondary | ICD-10-CM | POA: Diagnosis not present

## 2016-08-15 DIAGNOSIS — R609 Edema, unspecified: Secondary | ICD-10-CM | POA: Diagnosis not present

## 2016-08-15 DIAGNOSIS — R21 Rash and other nonspecific skin eruption: Secondary | ICD-10-CM | POA: Diagnosis not present

## 2016-08-15 DIAGNOSIS — C50911 Malignant neoplasm of unspecified site of right female breast: Secondary | ICD-10-CM | POA: Diagnosis not present

## 2016-08-15 DIAGNOSIS — Z885 Allergy status to narcotic agent status: Secondary | ICD-10-CM | POA: Diagnosis not present

## 2016-08-15 DIAGNOSIS — F17210 Nicotine dependence, cigarettes, uncomplicated: Secondary | ICD-10-CM | POA: Diagnosis not present

## 2016-08-15 DIAGNOSIS — D693 Immune thrombocytopenic purpura: Secondary | ICD-10-CM

## 2016-08-15 DIAGNOSIS — R5383 Other fatigue: Secondary | ICD-10-CM | POA: Diagnosis not present

## 2016-08-15 DIAGNOSIS — C799 Secondary malignant neoplasm of unspecified site: Secondary | ICD-10-CM | POA: Diagnosis not present

## 2016-08-15 DIAGNOSIS — D61818 Other pancytopenia: Secondary | ICD-10-CM | POA: Diagnosis not present

## 2016-08-15 DIAGNOSIS — Z9221 Personal history of antineoplastic chemotherapy: Secondary | ICD-10-CM | POA: Diagnosis not present

## 2016-08-15 DIAGNOSIS — D696 Thrombocytopenia, unspecified: Secondary | ICD-10-CM

## 2016-08-15 DIAGNOSIS — Z923 Personal history of irradiation: Secondary | ICD-10-CM | POA: Diagnosis not present

## 2016-08-15 DIAGNOSIS — E785 Hyperlipidemia, unspecified: Secondary | ICD-10-CM | POA: Diagnosis not present

## 2016-08-15 DIAGNOSIS — Z79899 Other long term (current) drug therapy: Secondary | ICD-10-CM | POA: Diagnosis not present

## 2016-08-15 MED ORDER — DIPHENHYDRAMINE HCL 25 MG PO CAPS
25.0000 mg | ORAL_CAPSULE | Freq: Once | ORAL | Status: AC
  Administered 2016-08-15: 25 mg via ORAL
  Filled 2016-08-15: qty 1

## 2016-08-15 MED ORDER — ACETAMINOPHEN 325 MG PO TABS
650.0000 mg | ORAL_TABLET | Freq: Once | ORAL | Status: AC
  Administered 2016-08-15: 650 mg via ORAL
  Filled 2016-08-15: qty 2

## 2016-08-15 MED ORDER — METHYLPREDNISOLONE SODIUM SUCC 125 MG IJ SOLR
40.0000 mg | Freq: Once | INTRAMUSCULAR | Status: AC
  Administered 2016-08-15: 40 mg via INTRAVENOUS
  Filled 2016-08-15: qty 2

## 2016-08-15 NOTE — Telephone Encounter (Signed)
Per patient - her plts was 113 today at [facility] (this was after one unit of plts in cancer center today). 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  Pt requests to change her apts for labs to Friday for labs/and plts. Pt given new apts times. Per pt request. Will cnl tommorrow's scheduled apts. Pt states that [facility] md would like cancer center md to order a - Vitamin B12 level at next visit.  Dr. Grayland Ormond, may I add this b12 level to pt's labs for Friday

## 2016-08-15 NOTE — Telephone Encounter (Signed)
That's fine

## 2016-08-16 ENCOUNTER — Other Ambulatory Visit: Payer: Self-pay | Admitting: *Deleted

## 2016-08-16 ENCOUNTER — Inpatient Hospital Stay: Payer: Medicare Other

## 2016-08-16 DIAGNOSIS — D693 Immune thrombocytopenic purpura: Secondary | ICD-10-CM

## 2016-08-16 DIAGNOSIS — D539 Nutritional anemia, unspecified: Secondary | ICD-10-CM

## 2016-08-16 LAB — BPAM PLATELET PHERESIS
Blood Product Expiration Date: 201806062359
ISSUE DATE / TIME: 201806060910
Unit Type and Rh: 7300

## 2016-08-16 LAB — PREPARE PLATELET PHERESIS: Unit division: 0

## 2016-08-16 NOTE — Telephone Encounter (Signed)
Cbc and b12 orders for 08/17/16 - entered for patient's chart.

## 2016-08-17 ENCOUNTER — Telehealth: Payer: Self-pay | Admitting: *Deleted

## 2016-08-17 ENCOUNTER — Inpatient Hospital Stay: Payer: Medicare Other

## 2016-08-17 ENCOUNTER — Encounter: Payer: Self-pay | Admitting: *Deleted

## 2016-08-17 ENCOUNTER — Other Ambulatory Visit: Payer: Self-pay

## 2016-08-17 DIAGNOSIS — D61818 Other pancytopenia: Secondary | ICD-10-CM | POA: Diagnosis not present

## 2016-08-17 DIAGNOSIS — D693 Immune thrombocytopenic purpura: Secondary | ICD-10-CM

## 2016-08-17 DIAGNOSIS — E538 Deficiency of other specified B group vitamins: Secondary | ICD-10-CM | POA: Diagnosis not present

## 2016-08-17 DIAGNOSIS — Z170 Estrogen receptor positive status [ER+]: Secondary | ICD-10-CM | POA: Diagnosis not present

## 2016-08-17 DIAGNOSIS — D539 Nutritional anemia, unspecified: Secondary | ICD-10-CM

## 2016-08-17 DIAGNOSIS — Z79899 Other long term (current) drug therapy: Secondary | ICD-10-CM | POA: Diagnosis not present

## 2016-08-17 DIAGNOSIS — C50811 Malignant neoplasm of overlapping sites of right female breast: Secondary | ICD-10-CM | POA: Diagnosis not present

## 2016-08-17 DIAGNOSIS — R609 Edema, unspecified: Secondary | ICD-10-CM | POA: Diagnosis not present

## 2016-08-17 LAB — CBC WITH DIFFERENTIAL/PLATELET
BASOS ABS: 0 10*3/uL (ref 0–0.1)
Basophils Relative: 0 %
Eosinophils Absolute: 0 10*3/uL (ref 0–0.7)
Eosinophils Relative: 1 %
HEMATOCRIT: 25 % — AB (ref 35.0–47.0)
Hemoglobin: 9 g/dL — ABNORMAL LOW (ref 12.0–16.0)
LYMPHS ABS: 1.4 10*3/uL (ref 1.0–3.6)
LYMPHS PCT: 46 %
MCH: 36.8 pg — AB (ref 26.0–34.0)
MCHC: 36.2 g/dL — ABNORMAL HIGH (ref 32.0–36.0)
MCV: 101.6 fL — ABNORMAL HIGH (ref 80.0–100.0)
MONO ABS: 0.3 10*3/uL (ref 0.2–0.9)
Monocytes Relative: 9 %
NEUTROS ABS: 1.4 10*3/uL (ref 1.4–6.5)
Neutrophils Relative %: 44 %
Platelets: 88 10*3/uL — ABNORMAL LOW (ref 150–440)
RBC: 2.46 MIL/uL — AB (ref 3.80–5.20)
RDW: 21.5 % — ABNORMAL HIGH (ref 11.5–14.5)
WBC: 3.1 10*3/uL — AB (ref 3.6–11.0)

## 2016-08-17 LAB — VITAMIN B12: Vitamin B-12: 239 pg/mL (ref 180–914)

## 2016-08-17 NOTE — Progress Notes (Signed)
Chart reviewed. plt count 88 today. No need for IV plt infusion.

## 2016-08-17 NOTE — Telephone Encounter (Signed)
-----   Message from Sindy Guadeloupe, MD sent at 08/17/2016 10:39 AM EDT ----- Yes put her on for possible nplate ----- Message ----- From: Sabino Gasser, RN Sent: 08/17/2016  10:35 AM To: Sindy Guadeloupe, MD  Chart reviewed. plt count 88 today. No need for IV plt infusion. Pt was informed of lab values today.  Dr. Janese Banks, do you want me to add possible Nplate for next week?  She will see you next Tuesday 6/12.

## 2016-08-17 NOTE — Telephone Encounter (Signed)
nplate added to schedule by RN per md order.

## 2016-08-20 ENCOUNTER — Other Ambulatory Visit: Payer: Self-pay | Admitting: *Deleted

## 2016-08-20 DIAGNOSIS — D693 Immune thrombocytopenic purpura: Secondary | ICD-10-CM

## 2016-08-21 ENCOUNTER — Inpatient Hospital Stay (HOSPITAL_BASED_OUTPATIENT_CLINIC_OR_DEPARTMENT_OTHER): Payer: Medicare Other | Admitting: Oncology

## 2016-08-21 ENCOUNTER — Inpatient Hospital Stay: Payer: Medicare Other

## 2016-08-21 VITALS — BP 133/88 | HR 98 | Temp 96.6°F | Resp 18 | Wt 154.6 lb

## 2016-08-21 DIAGNOSIS — E538 Deficiency of other specified B group vitamins: Secondary | ICD-10-CM

## 2016-08-21 DIAGNOSIS — F17210 Nicotine dependence, cigarettes, uncomplicated: Secondary | ICD-10-CM

## 2016-08-21 DIAGNOSIS — R609 Edema, unspecified: Secondary | ICD-10-CM

## 2016-08-21 DIAGNOSIS — R531 Weakness: Secondary | ICD-10-CM | POA: Diagnosis not present

## 2016-08-21 DIAGNOSIS — C50811 Malignant neoplasm of overlapping sites of right female breast: Secondary | ICD-10-CM

## 2016-08-21 DIAGNOSIS — Z170 Estrogen receptor positive status [ER+]: Secondary | ICD-10-CM

## 2016-08-21 DIAGNOSIS — Z79899 Other long term (current) drug therapy: Secondary | ICD-10-CM

## 2016-08-21 DIAGNOSIS — M1990 Unspecified osteoarthritis, unspecified site: Secondary | ICD-10-CM

## 2016-08-21 DIAGNOSIS — D693 Immune thrombocytopenic purpura: Secondary | ICD-10-CM

## 2016-08-21 DIAGNOSIS — E785 Hyperlipidemia, unspecified: Secondary | ICD-10-CM

## 2016-08-21 DIAGNOSIS — R5383 Other fatigue: Secondary | ICD-10-CM

## 2016-08-21 DIAGNOSIS — D61818 Other pancytopenia: Secondary | ICD-10-CM | POA: Diagnosis not present

## 2016-08-21 DIAGNOSIS — Z9221 Personal history of antineoplastic chemotherapy: Secondary | ICD-10-CM

## 2016-08-21 DIAGNOSIS — I10 Essential (primary) hypertension: Secondary | ICD-10-CM | POA: Diagnosis not present

## 2016-08-21 DIAGNOSIS — Z923 Personal history of irradiation: Secondary | ICD-10-CM

## 2016-08-21 LAB — CBC WITH DIFFERENTIAL/PLATELET
Basophils Absolute: 0 10*3/uL (ref 0–0.1)
Basophils Relative: 0 %
EOS ABS: 0 10*3/uL (ref 0–0.7)
Eosinophils Relative: 1 %
HCT: 24.4 % — ABNORMAL LOW (ref 35.0–47.0)
Hemoglobin: 8.7 g/dL — ABNORMAL LOW (ref 12.0–16.0)
LYMPHS ABS: 1.1 10*3/uL (ref 1.0–3.6)
Lymphocytes Relative: 42 %
MCH: 36.7 pg — AB (ref 26.0–34.0)
MCHC: 35.7 g/dL (ref 32.0–36.0)
MCV: 102.8 fL — ABNORMAL HIGH (ref 80.0–100.0)
MONOS PCT: 8 %
Monocytes Absolute: 0.2 10*3/uL (ref 0.2–0.9)
NEUTROS PCT: 49 %
Neutro Abs: 1.3 10*3/uL — ABNORMAL LOW (ref 1.4–6.5)
Platelets: 46 10*3/uL — ABNORMAL LOW (ref 150–440)
RBC: 2.37 MIL/uL — ABNORMAL LOW (ref 3.80–5.20)
RDW: 22.1 % — ABNORMAL HIGH (ref 11.5–14.5)
WBC: 2.7 10*3/uL — ABNORMAL LOW (ref 3.6–11.0)

## 2016-08-21 LAB — SAMPLE TO BLOOD BANK

## 2016-08-21 MED ORDER — ROMIPLOSTIM INJECTION 500 MCG
4.0000 ug/kg | Freq: Once | SUBCUTANEOUS | Status: AC
  Administered 2016-08-21: 280 ug via SUBCUTANEOUS
  Filled 2016-08-21: qty 0.56

## 2016-08-21 MED ORDER — CYANOCOBALAMIN 1000 MCG/ML IJ SOLN
1000.0000 ug | Freq: Once | INTRAMUSCULAR | Status: AC
  Administered 2016-08-21: 1000 ug via INTRAMUSCULAR
  Filled 2016-08-21: qty 1

## 2016-08-21 NOTE — Progress Notes (Signed)
Here for follow up

## 2016-08-22 ENCOUNTER — Encounter: Payer: Self-pay | Admitting: Oncology

## 2016-08-22 NOTE — Progress Notes (Signed)
Hematology/Oncology Consult note [facility]  [phone] [phone]  Patient Care Team: Coral Spikes, DO as PCP - General (Family Medicine) [name], DPM as Consulting Physician (Podiatry) [name], MD as Consulting Physician (Internal Medicine) Bary Castilla Forest Gleason, MD (General Surgery)   Name of the patient: Shelly Arias  [id number]  Apr 22, 1945   Date of visit: 08/22/16  Diagnosis- 1. Metastatic ER positive breast cancer 2. 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 Etiology remains uncertain  Chief complaint/ Reason for visit- f/u for N Plate  Heme/Onc history:  Oncology History   # April 2018- Left chest wall Bx- IMC; ER-PR-POS; her 2 Neu NEG; PET- mild hilar/distal adenopathy; ~1 cm right lung nodule/ several sub-centimeter.   # [facility]; Abemacliclib [on HOLD sec to cytopenia]  # April 2017-isolated Thrombocytopenia- platelets- 113; worsening PANCYTOPENIA- April 2018- BMBx- no evidence of malignancy; MDS/Acute leuk; Karyotype/MDS- FISH panel-NEG [d/w Dr.Smir];   # 2006- RIGHT BREAST CA [pT1cN0M0; STAGE I; ER/PRPos; Her 2 Neu-NEG] s/p FEC x 6 [NSABP B-36]; Femara [Dec 2007-2012]  # Osteoporosis s/p Reclast; BMD- 2015-wnl- ca+vit D         Carcinoma of overlapping sites of right breast in female, estrogen receptor positive ([place])   Patient seen by DR. Foster at [facility] for second opinion on pancytopenia. She also had repeat bm biopsy but no clear etiology identified. Plan was to continue to treat her thrombocytopenia like ITP. Possible B12 supplementation. Hold abemaciclib until counts improve  Interval history- patient reports painful swelling in her left leg right above the ankle. Has chronic fatigue. Denies any bleeding  ECOG PS- 1 Pain scale- 3   Review of systems- Review of Systems  Constitutional: Positive for malaise/fatigue. Negative for chills, fever and weight loss.  HENT: Negative for congestion, ear  discharge and nosebleeds.   Eyes: Negative for blurred vision.  Respiratory: Negative for cough, hemoptysis, sputum production, shortness of breath and wheezing.   Cardiovascular: Negative for chest pain, palpitations, orthopnea and claudication.  Gastrointestinal: Negative for abdominal pain, blood in stool, constipation, diarrhea, heartburn, melena, nausea and vomiting.  Genitourinary: Negative for dysuria, flank pain, frequency, hematuria and urgency.  Musculoskeletal: Negative for back pain, joint pain and myalgias.  Skin: Negative for rash.  Neurological: Negative for dizziness, tingling, focal weakness, seizures, weakness and headaches.  Endo/Heme/Allergies: Does not bruise/bleed easily.  Psychiatric/Behavioral: Negative for depression and suicidal ideas. The patient does not have insomnia.        Allergies  Allergen Reactions  . Codeine Anaphylaxis  . Fish-Derived Products Anaphylaxis  . Morphine Sulfate Anaphylaxis    REACTION: anaphylaxis  . Fish Allergy   . Morphine Sulfate     REACTION: anaphylaxis  . Oxycodone     Nausea and vomiting     Past Medical History:  Diagnosis Date  . Breast cancer ([place])    right, lumpectomy, radiation, chemo  . Breast cancer ([place])    Right, 2007  . Breast cancer ([place]) 06/20/2016   INVASIVE DUCTAL CARCINOMA.  . Headache   . History of chemotherapy   . History of radiation therapy   . Hyperlipidemia   . Hypertension   . Osteoarthritis    right hip  . Osteoporosis   . Squamous cell carcinoma    leg, Followed by Dr. Nicole Kindred     Past Surgical History:  Procedure Laterality Date  . ABDOMINAL HYSTERECTOMY    . BREAST BIOPSY Left 2002   left breast, calcifications  . BREAST BIOPSY  Left 06/20/2016   INVASIVE DUCTAL CARCINOMA.  Marland Kitchen BREAST EXCISIONAL BIOPSY Right 2007   positive  . bunion repair    . ESOPHAGOGASTRODUODENOSCOPY (EGD) WITH PROPOFOL N/A 08/05/2016   Procedure: ESOPHAGOGASTRODUODENOSCOPY (EGD) WITH PROPOFOL;  Surgeon:  San Jetty, MD;  Location: [facility] ENDOSCOPY;  Service: General;  Laterality: N/A;  . left breast biopsy    . NASAL SINUS SURGERY    . right hip replacement    . SHOULDER SURGERY    . SQUAMOUS CELL CARCINOMA EXCISION     right leg, Dr. Nicole Kindred  . TOTAL HIP ARTHROPLASTY     right    Social History   Social History  . Marital status: Married    Spouse name: N/A  . Number of children: N/A  . Years of education: N/A   Occupational History  . Not on file.   Social History Main Topics  . Smoking status: Current Some Day Smoker    Packs/day: 0.10    Years: 30.00    Types: Cigarettes  . Smokeless tobacco: Never Used  . Alcohol use 0.0 oz/week     Comment: socially  . Drug use: No  . Sexual activity: No   Other Topics Concern  . Not on file   Social History Narrative  . No narrative on file    Family History  Problem Relation Age of Onset  . Heart disease Father 72  . Heart disease Mother   . Hyperlipidemia Mother   . Heart disease Brother   . Diabetes Maternal Grandmother   . Breast cancer Cousin      Current Outpatient Prescriptions:  .  ALPRAZolam (XANAX) 0.5 MG tablet, Take 1 tablet (0.5 mg total) by mouth every 8 (eight) hours as needed for anxiety or sleep., Disp: 30 tablet, Rfl: 2 .  Cholecalciferol (VITAMIN D3) 1000 units CAPS, Take 1 capsule by mouth daily. , Disp: , Rfl:  .  Fexofenadine HCl (ALLEGRA PO), Take 1 capsule by mouth., Disp: , Rfl:  .  letrozole (FEMARA) 2.5 MG tablet, Take 1 tablet (2.5 mg total) by mouth daily. Once a day., Disp: 90 tablet, Rfl: 3 .  metoprolol succinate (TOPROL-XL) 25 MG 24 hr tablet, Take 1 tablet (25 mg total) by mouth daily., Disp: 90 tablet, Rfl: 3 .  pravastatin (PRAVACHOL) 40 MG tablet, Take 1 tablet (40 mg total) by mouth daily., Disp: 90 tablet, Rfl: 3 .  Probiotic Product (PROBIOTIC & ACIDOPHILUS EX ST PO), Take 1 tablet by mouth daily. , Disp: , Rfl:  .  Abemaciclib 150 MG TABS, Take 150 mg by mouth every 12  (twelve) hours. (Patient not taking: Reported on 08/21/2016), Disp: 60 tablet, Rfl: 6 .  ondansetron (ZOFRAN) 8 MG tablet, Take 1 tablet (8 mg total) by mouth every 8 (eight) hours as needed for nausea or vomiting. (Patient not taking: Reported on 08/21/2016), Disp: 20 tablet, Rfl: 4 .  ranitidine (ZANTAC) 150 MG capsule, Take 150 mg by mouth 2 (two) times daily., Disp: , Rfl:  .  triamcinolone ointment (KENALOG) 0.5 %, Apply 1 application topically 2 (two) times daily. (Patient not taking: Reported on 08/21/2016), Disp: 30 g, Rfl: 0  Physical exam:  Vitals:   08/21/16 1104  BP: 133/88  Pulse: 98  Resp: 18  Temp: (!) 96.6 F (35.9 C)  TempSrc: Tympanic  Weight: 154 lb 9.6 oz (70.1 kg)   Physical Exam  Constitutional: She is oriented to person, place, and time and well-developed, well-nourished, and in no distress.  HENT:  Head: Normocephalic and atraumatic.  Eyes: EOM are normal. Pupils are equal, round, and reactive to light.  Neck: Normal range of motion.  Cardiovascular: Normal rate, regular rhythm and normal heart sounds.   Pulmonary/Chest: Effort normal and breath sounds normal.  Abdominal: Soft. Bowel sounds are normal.  Neurological: She is alert and oriented to person, place, and time.  Skin: Skin is warm and dry.  Small subcutaneous swelling seen in her left leg. Does not appear like cellulitis     CMP Latest Ref Rng & Units 08/04/2016  Glucose 65 - 99 mg/dL 116(H)  BUN 6 - 20 mg/dL 9  Creatinine 0.44 - 1.00 mg/dL 0.69  Sodium 135 - 145 mmol/L 130(L)  Potassium 3.5 - 5.1 mmol/L 4.0  Chloride 101 - 111 mmol/L 98(L)  CO2 22 - 32 mmol/L 24  Calcium 8.9 - 10.3 mg/dL 8.6(L)  Total Protein 6.5 - 8.1 g/dL -  Total Bilirubin 0.3 - 1.2 mg/dL -  Alkaline Phos 38 - 126 U/L -  AST 15 - 41 U/L -  ALT 14 - 54 U/L -   CBC Latest Ref Rng & Units 08/21/2016  WBC 3.6 - 11.0 K/uL 2.7(L)  Hemoglobin 12.0 - 16.0 g/dL 8.7(L)  Hematocrit 35.0 - 47.0 % 24.4(L)  Platelets 150 - 440 K/uL  46(L)      Assessment and plan- Patient is a 71 y.o. female with metastatic breast cancer complicated by pancytopenia  1. Pancytopenia- no clear etiology identified on reepat bm biopsy at [facility]. I personally spoke to Dr. Royce Macadamia as well. At this point we will continue N plate and supportive transfusions prn. Will increase dose of nplate to 92mg/kg this week. She will get weekly n plate and will see DR. bRogue Bussing3 weeks from now. Continue weekly cbc  2. Metastatic breast cancer- abemaciclib on hold. Continue letrozole. She is unable to get any systemic treatment other than letrozole due to her pancytopenia  4. b12 deficiency- she will get b12 shot today and monthly  3. subcut left leg swelling- continue to monitor. It is very small at this time. Infection versus subcut met?  rtc in 3 weeks to see Dr. BRogue Bussing  Visit Diagnosis 1. 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 ARanda Evens MD, MPH CBapt[facility]ooat AFoundation Surgical Hospital Of San AntonioPager- [phone]/13/2018 2:38 PM

## 2016-08-23 ENCOUNTER — Inpatient Hospital Stay: Payer: Medicare Other

## 2016-08-23 ENCOUNTER — Other Ambulatory Visit: Payer: Self-pay | Admitting: *Deleted

## 2016-08-23 DIAGNOSIS — C50811 Malignant neoplasm of overlapping sites of right female breast: Secondary | ICD-10-CM

## 2016-08-23 DIAGNOSIS — E538 Deficiency of other specified B group vitamins: Secondary | ICD-10-CM | POA: Diagnosis not present

## 2016-08-23 DIAGNOSIS — Z170 Estrogen receptor positive status [ER+]: Principal | ICD-10-CM

## 2016-08-23 DIAGNOSIS — R609 Edema, unspecified: Secondary | ICD-10-CM | POA: Diagnosis not present

## 2016-08-23 DIAGNOSIS — D693 Immune thrombocytopenic purpura: Secondary | ICD-10-CM

## 2016-08-23 DIAGNOSIS — Z79899 Other long term (current) drug therapy: Secondary | ICD-10-CM | POA: Diagnosis not present

## 2016-08-23 DIAGNOSIS — D61818 Other pancytopenia: Secondary | ICD-10-CM | POA: Diagnosis not present

## 2016-08-23 LAB — CBC WITH DIFFERENTIAL/PLATELET
BASOS PCT: 0 %
Basophils Absolute: 0 10*3/uL (ref 0–0.1)
EOS ABS: 0 10*3/uL (ref 0–0.7)
EOS PCT: 1 %
HCT: 24.9 % — ABNORMAL LOW (ref 35.0–47.0)
HEMOGLOBIN: 8.9 g/dL — AB (ref 12.0–16.0)
Lymphocytes Relative: 40 %
Lymphs Abs: 1.3 10*3/uL (ref 1.0–3.6)
MCH: 36.9 pg — AB (ref 26.0–34.0)
MCHC: 35.9 g/dL (ref 32.0–36.0)
MCV: 102.8 fL — ABNORMAL HIGH (ref 80.0–100.0)
MONO ABS: 0.3 10*3/uL (ref 0.2–0.9)
MONOS PCT: 9 %
NEUTROS PCT: 50 %
Neutro Abs: 1.6 10*3/uL (ref 1.4–6.5)
PLATELETS: 35 10*3/uL — AB (ref 150–440)
RBC: 2.42 MIL/uL — ABNORMAL LOW (ref 3.80–5.20)
RDW: 22.9 % — AB (ref 11.5–14.5)
WBC: 3.2 10*3/uL — ABNORMAL LOW (ref 3.6–11.0)

## 2016-08-23 NOTE — Progress Notes (Signed)
11:55 Per Dr Mike Gip and Olivia Mackie, RN pt does not need platelets today, however, will need to return tomorrow for labs to recheck platelets in the morning and possible infusion in afternoon. Per Olivia Mackie will set this up with scheduling.  12:57 Called pt to inform her that she did not need to come to her infusion appt this afternoon and inform her of her appts tomorrow. Pt appreciates call and verbalizes understanding.

## 2016-08-24 ENCOUNTER — Inpatient Hospital Stay: Payer: Medicare Other

## 2016-08-24 ENCOUNTER — Telehealth: Payer: Self-pay

## 2016-08-24 DIAGNOSIS — Z170 Estrogen receptor positive status [ER+]: Secondary | ICD-10-CM | POA: Diagnosis not present

## 2016-08-24 DIAGNOSIS — R609 Edema, unspecified: Secondary | ICD-10-CM | POA: Diagnosis not present

## 2016-08-24 DIAGNOSIS — D61818 Other pancytopenia: Secondary | ICD-10-CM | POA: Diagnosis not present

## 2016-08-24 DIAGNOSIS — C50811 Malignant neoplasm of overlapping sites of right female breast: Secondary | ICD-10-CM

## 2016-08-24 DIAGNOSIS — E538 Deficiency of other specified B group vitamins: Secondary | ICD-10-CM | POA: Diagnosis not present

## 2016-08-24 DIAGNOSIS — Z79899 Other long term (current) drug therapy: Secondary | ICD-10-CM | POA: Diagnosis not present

## 2016-08-24 LAB — CBC WITH DIFFERENTIAL/PLATELET
Basophils Absolute: 0 10*3/uL (ref 0–0.1)
Basophils Relative: 1 %
Eosinophils Absolute: 0 10*3/uL (ref 0–0.7)
Eosinophils Relative: 1 %
HCT: 24.2 % — ABNORMAL LOW (ref 35.0–47.0)
Hemoglobin: 8.8 g/dL — ABNORMAL LOW (ref 12.0–16.0)
Lymphocytes Relative: 40 %
Lymphs Abs: 1.1 10*3/uL (ref 1.0–3.6)
MCH: 37.1 pg — ABNORMAL HIGH (ref 26.0–34.0)
MCHC: 36.1 g/dL — ABNORMAL HIGH (ref 32.0–36.0)
MCV: 102.7 fL — ABNORMAL HIGH (ref 80.0–100.0)
Monocytes Absolute: 0.2 10*3/uL (ref 0.2–0.9)
Monocytes Relative: 8 %
Neutro Abs: 1.4 10*3/uL (ref 1.4–6.5)
Neutrophils Relative %: 50 %
Platelets: 34 10*3/uL — ABNORMAL LOW (ref 150–440)
RBC: 2.36 MIL/uL — ABNORMAL LOW (ref 3.80–5.20)
RDW: 23.1 % — ABNORMAL HIGH (ref 11.5–14.5)
WBC: 2.8 10*3/uL — ABNORMAL LOW (ref 3.6–11.0)

## 2016-08-24 NOTE — Telephone Encounter (Signed)
Patient is wondering if she should see Dr. Rogue Bussing before she goes on an out of state vacation on 09/10/16.

## 2016-08-24 NOTE — Progress Notes (Signed)
Platelets result today of 34.  Dr. Mike Gip advise to platelet transfusion today and keep appt on Monday.  Patient informed.

## 2016-08-24 NOTE — Telephone Encounter (Signed)
We can discuss with Shelly Arias next week, I'm not sure if Dr B gave Shelly Arias any instructions for this pt's care plan

## 2016-08-27 ENCOUNTER — Inpatient Hospital Stay: Payer: Medicare Other | Admitting: Oncology

## 2016-08-27 ENCOUNTER — Telehealth: Payer: Self-pay | Admitting: *Deleted

## 2016-08-27 ENCOUNTER — Inpatient Hospital Stay: Payer: Medicare Other

## 2016-08-27 DIAGNOSIS — Z79899 Other long term (current) drug therapy: Secondary | ICD-10-CM | POA: Diagnosis not present

## 2016-08-27 DIAGNOSIS — C50811 Malignant neoplasm of overlapping sites of right female breast: Secondary | ICD-10-CM | POA: Diagnosis not present

## 2016-08-27 DIAGNOSIS — D61818 Other pancytopenia: Secondary | ICD-10-CM | POA: Diagnosis not present

## 2016-08-27 DIAGNOSIS — D693 Immune thrombocytopenic purpura: Secondary | ICD-10-CM

## 2016-08-27 DIAGNOSIS — Z170 Estrogen receptor positive status [ER+]: Secondary | ICD-10-CM | POA: Diagnosis not present

## 2016-08-27 DIAGNOSIS — E538 Deficiency of other specified B group vitamins: Secondary | ICD-10-CM | POA: Diagnosis not present

## 2016-08-27 DIAGNOSIS — R609 Edema, unspecified: Secondary | ICD-10-CM | POA: Diagnosis not present

## 2016-08-27 LAB — CBC WITH DIFFERENTIAL/PLATELET
Basophils Absolute: 0 10*3/uL (ref 0–0.1)
Basophils Relative: 0 %
EOS PCT: 1 %
Eosinophils Absolute: 0 10*3/uL (ref 0–0.7)
HCT: 23.5 % — ABNORMAL LOW (ref 35.0–47.0)
Hemoglobin: 8.5 g/dL — ABNORMAL LOW (ref 12.0–16.0)
LYMPHS ABS: 1 10*3/uL (ref 1.0–3.6)
LYMPHS PCT: 34 %
MCH: 37 pg — ABNORMAL HIGH (ref 26.0–34.0)
MCHC: 36.1 g/dL — AB (ref 32.0–36.0)
MCV: 102.4 fL — AB (ref 80.0–100.0)
MONO ABS: 0.2 10*3/uL (ref 0.2–0.9)
MONOS PCT: 7 %
Neutro Abs: 1.7 10*3/uL (ref 1.4–6.5)
Neutrophils Relative %: 58 %
PLATELETS: 32 10*3/uL — AB (ref 150–440)
RBC: 2.3 MIL/uL — AB (ref 3.80–5.20)
RDW: 22.9 % — ABNORMAL HIGH (ref 11.5–14.5)
WBC: 2.9 10*3/uL — AB (ref 3.6–11.0)

## 2016-08-27 LAB — SAMPLE TO BLOOD BANK

## 2016-08-27 MED ORDER — ROMIPLOSTIM INJECTION 500 MCG
4.0000 ug/kg | Freq: Once | SUBCUTANEOUS | Status: AC
  Administered 2016-08-27: 280 ug via SUBCUTANEOUS
  Filled 2016-08-27: qty 0.56

## 2016-08-27 NOTE — Progress Notes (Signed)
Plt =32.  NPlate 74mcg/kg ordered.  Last dose given was 35mcg/kg.  Verified with MD...continue 37mcg/kg

## 2016-08-27 NOTE — Telephone Encounter (Signed)
Called and left message for pt but in between typing this message that the pt called me back.     She does not  Need plt transfusion.  She is getting n plate today.  I have checked with Brandi in insurance and a day early for shot is ok.  The patient spoke to me and will come back Thursday of this week for labs and poss. Fluids.  She also wants to come on 6/29 for labs and poss. Plt. .  I told her that I would send a message to [facility] and ask her. I believe it would be ok. She is agreeable to this plan.

## 2016-08-27 NOTE — Progress Notes (Signed)
Hold off on platelet transfusion at this time

## 2016-08-28 ENCOUNTER — Inpatient Hospital Stay: Payer: Medicare Other

## 2016-08-28 NOTE — Telephone Encounter (Signed)
Please call pt and add pt to the schedule lab/md on 6/28 at 8:15, that's the only availability we have prior to 7/2.

## 2016-08-30 ENCOUNTER — Telehealth: Payer: Self-pay | Admitting: *Deleted

## 2016-08-30 ENCOUNTER — Inpatient Hospital Stay: Payer: Medicare Other

## 2016-08-30 ENCOUNTER — Inpatient Hospital Stay: Payer: Medicare Other | Admitting: Oncology

## 2016-08-30 DIAGNOSIS — C50811 Malignant neoplasm of overlapping sites of right female breast: Secondary | ICD-10-CM

## 2016-08-30 DIAGNOSIS — E538 Deficiency of other specified B group vitamins: Secondary | ICD-10-CM | POA: Diagnosis not present

## 2016-08-30 DIAGNOSIS — D61818 Other pancytopenia: Secondary | ICD-10-CM | POA: Diagnosis not present

## 2016-08-30 DIAGNOSIS — Z79899 Other long term (current) drug therapy: Secondary | ICD-10-CM | POA: Diagnosis not present

## 2016-08-30 DIAGNOSIS — Z170 Estrogen receptor positive status [ER+]: Principal | ICD-10-CM

## 2016-08-30 DIAGNOSIS — R609 Edema, unspecified: Secondary | ICD-10-CM | POA: Diagnosis not present

## 2016-08-30 DIAGNOSIS — D693 Immune thrombocytopenic purpura: Secondary | ICD-10-CM

## 2016-08-30 LAB — CBC WITH DIFFERENTIAL/PLATELET
BASOS ABS: 0 10*3/uL (ref 0–0.1)
BASOS PCT: 0 %
EOS PCT: 1 %
Eosinophils Absolute: 0 10*3/uL (ref 0–0.7)
HCT: 24.1 % — ABNORMAL LOW (ref 35.0–47.0)
Hemoglobin: 8.6 g/dL — ABNORMAL LOW (ref 12.0–16.0)
Lymphocytes Relative: 38 %
Lymphs Abs: 1.1 10*3/uL (ref 1.0–3.6)
MCH: 37.1 pg — ABNORMAL HIGH (ref 26.0–34.0)
MCHC: 35.7 g/dL (ref 32.0–36.0)
MCV: 103.9 fL — AB (ref 80.0–100.0)
MONO ABS: 0.3 10*3/uL (ref 0.2–0.9)
Monocytes Relative: 10 %
Neutro Abs: 1.5 10*3/uL (ref 1.4–6.5)
Neutrophils Relative %: 51 %
PLATELETS: 35 10*3/uL — AB (ref 150–440)
RBC: 2.32 MIL/uL — ABNORMAL LOW (ref 3.80–5.20)
RDW: 23.5 % — AB (ref 11.5–14.5)
WBC: 3 10*3/uL — ABNORMAL LOW (ref 3.6–11.0)

## 2016-08-30 NOTE — Progress Notes (Signed)
No transfusion

## 2016-08-30 NOTE — Telephone Encounter (Signed)
Called pt to let her know about results and she already knew the number and no transfusion needed

## 2016-08-30 NOTE — Telephone Encounter (Signed)
-----   Message from Sindy Guadeloupe, MD sent at 08/30/2016  9:41 AM EDT ----- No transfusion

## 2016-09-04 ENCOUNTER — Inpatient Hospital Stay: Payer: Medicare Other

## 2016-09-04 DIAGNOSIS — D693 Immune thrombocytopenic purpura: Secondary | ICD-10-CM

## 2016-09-04 DIAGNOSIS — Z170 Estrogen receptor positive status [ER+]: Secondary | ICD-10-CM

## 2016-09-04 DIAGNOSIS — R609 Edema, unspecified: Secondary | ICD-10-CM | POA: Diagnosis not present

## 2016-09-04 DIAGNOSIS — C50811 Malignant neoplasm of overlapping sites of right female breast: Secondary | ICD-10-CM

## 2016-09-04 DIAGNOSIS — Z79899 Other long term (current) drug therapy: Secondary | ICD-10-CM | POA: Diagnosis not present

## 2016-09-04 DIAGNOSIS — D61818 Other pancytopenia: Secondary | ICD-10-CM | POA: Diagnosis not present

## 2016-09-04 DIAGNOSIS — E538 Deficiency of other specified B group vitamins: Secondary | ICD-10-CM | POA: Diagnosis not present

## 2016-09-04 LAB — CBC
HEMATOCRIT: 23.4 % — AB (ref 35.0–47.0)
Hemoglobin: 8.5 g/dL — ABNORMAL LOW (ref 12.0–16.0)
MCH: 38.1 pg — ABNORMAL HIGH (ref 26.0–34.0)
MCHC: 36.3 g/dL — ABNORMAL HIGH (ref 32.0–36.0)
MCV: 105 fL — AB (ref 80.0–100.0)
Platelets: 37 10*3/uL — ABNORMAL LOW (ref 150–440)
RBC: 2.23 MIL/uL — AB (ref 3.80–5.20)
RDW: 23.8 % — ABNORMAL HIGH (ref 11.5–14.5)
WBC: 2.7 10*3/uL — AB (ref 3.6–11.0)

## 2016-09-04 LAB — SAMPLE TO BLOOD BANK

## 2016-09-04 MED ORDER — ROMIPLOSTIM INJECTION 500 MCG
4.0000 ug/kg | Freq: Once | SUBCUTANEOUS | Status: AC
  Administered 2016-09-04: 280 ug via SUBCUTANEOUS
  Filled 2016-09-04: qty 0.56

## 2016-09-06 ENCOUNTER — Inpatient Hospital Stay: Payer: Medicare Other | Admitting: Oncology

## 2016-09-06 ENCOUNTER — Inpatient Hospital Stay (HOSPITAL_BASED_OUTPATIENT_CLINIC_OR_DEPARTMENT_OTHER): Payer: Medicare Other | Admitting: Internal Medicine

## 2016-09-06 ENCOUNTER — Inpatient Hospital Stay: Payer: Medicare Other

## 2016-09-06 ENCOUNTER — Other Ambulatory Visit: Payer: Self-pay | Admitting: *Deleted

## 2016-09-06 VITALS — BP 141/80 | HR 90 | Temp 97.6°F | Resp 18 | Ht 61.0 in | Wt 155.0 lb

## 2016-09-06 DIAGNOSIS — Z79899 Other long term (current) drug therapy: Secondary | ICD-10-CM | POA: Diagnosis not present

## 2016-09-06 DIAGNOSIS — D61818 Other pancytopenia: Secondary | ICD-10-CM

## 2016-09-06 DIAGNOSIS — R531 Weakness: Secondary | ICD-10-CM | POA: Diagnosis not present

## 2016-09-06 DIAGNOSIS — Z170 Estrogen receptor positive status [ER+]: Principal | ICD-10-CM

## 2016-09-06 DIAGNOSIS — M1990 Unspecified osteoarthritis, unspecified site: Secondary | ICD-10-CM

## 2016-09-06 DIAGNOSIS — F17210 Nicotine dependence, cigarettes, uncomplicated: Secondary | ICD-10-CM

## 2016-09-06 DIAGNOSIS — I10 Essential (primary) hypertension: Secondary | ICD-10-CM | POA: Diagnosis not present

## 2016-09-06 DIAGNOSIS — D693 Immune thrombocytopenic purpura: Secondary | ICD-10-CM

## 2016-09-06 DIAGNOSIS — E785 Hyperlipidemia, unspecified: Secondary | ICD-10-CM

## 2016-09-06 DIAGNOSIS — C50811 Malignant neoplasm of overlapping sites of right female breast: Secondary | ICD-10-CM | POA: Diagnosis not present

## 2016-09-06 DIAGNOSIS — Z923 Personal history of irradiation: Secondary | ICD-10-CM

## 2016-09-06 DIAGNOSIS — R609 Edema, unspecified: Secondary | ICD-10-CM | POA: Diagnosis not present

## 2016-09-06 DIAGNOSIS — R5383 Other fatigue: Secondary | ICD-10-CM | POA: Diagnosis not present

## 2016-09-06 DIAGNOSIS — E538 Deficiency of other specified B group vitamins: Secondary | ICD-10-CM

## 2016-09-06 DIAGNOSIS — Z9221 Personal history of antineoplastic chemotherapy: Secondary | ICD-10-CM

## 2016-09-06 LAB — CBC WITH DIFFERENTIAL/PLATELET
BASOS ABS: 0 10*3/uL (ref 0–0.1)
BASOS PCT: 1 %
EOS ABS: 0 10*3/uL (ref 0–0.7)
Eosinophils Relative: 1 %
HCT: 23.3 % — ABNORMAL LOW (ref 35.0–47.0)
HEMOGLOBIN: 8.4 g/dL — AB (ref 12.0–16.0)
Lymphocytes Relative: 34 %
Lymphs Abs: 0.9 10*3/uL — ABNORMAL LOW (ref 1.0–3.6)
MCH: 37.5 pg — ABNORMAL HIGH (ref 26.0–34.0)
MCHC: 36 g/dL (ref 32.0–36.0)
MCV: 104.2 fL — ABNORMAL HIGH (ref 80.0–100.0)
MONOS PCT: 8 %
Monocytes Absolute: 0.2 10*3/uL (ref 0.2–0.9)
NEUTROS PCT: 56 %
Neutro Abs: 1.5 10*3/uL (ref 1.4–6.5)
Platelets: 36 10*3/uL — ABNORMAL LOW (ref 150–440)
RBC: 2.23 MIL/uL — ABNORMAL LOW (ref 3.80–5.20)
RDW: 24 % — ABNORMAL HIGH (ref 11.5–14.5)
WBC: 2.7 10*3/uL — AB (ref 3.6–11.0)

## 2016-09-06 MED ORDER — ACETAMINOPHEN 325 MG PO TABS
650.0000 mg | ORAL_TABLET | Freq: Once | ORAL | Status: AC
  Administered 2016-09-06: 650 mg via ORAL
  Filled 2016-09-06: qty 2

## 2016-09-06 MED ORDER — SODIUM CHLORIDE 0.9 % IV SOLN
250.0000 mL | Freq: Once | INTRAVENOUS | Status: AC
  Administered 2016-09-06: 250 mL via INTRAVENOUS
  Filled 2016-09-06: qty 250

## 2016-09-06 MED ORDER — METHYLPREDNISOLONE SODIUM SUCC 125 MG IJ SOLR
40.0000 mg | Freq: Once | INTRAMUSCULAR | Status: AC
  Administered 2016-09-06: 40 mg via INTRAVENOUS
  Filled 2016-09-06: qty 2

## 2016-09-06 MED ORDER — DIPHENHYDRAMINE HCL 25 MG PO CAPS
25.0000 mg | ORAL_CAPSULE | Freq: Once | ORAL | Status: AC
  Administered 2016-09-06: 25 mg via ORAL
  Filled 2016-09-06: qty 1

## 2016-09-06 NOTE — Progress Notes (Signed)
FYI

## 2016-09-06 NOTE — Progress Notes (Signed)
[place] OFFICE PROGRESS NOTE  Patient Care Team: Coral Spikes, DO as PCP - General (Family Medicine) [name], DPM as Consulting Physician (Podiatry) [name], MD as Consulting Physician (Internal Medicine) Robert Bellow, MD (General Surgery)   SUMMARY OF ONCOLOGIC HISTORY:  Oncology History   # April 2018- Left chest wall Bx- IMC; ER-PR-POS; her 2 Neu NEG; PET- mild hilar/distal adenopathy; ~1 cm right lung nodule/ several sub-centimeter.   # May 2018[facility]; Abemacliclib [on HOLD sec to cytopenia]  # April 2017-isolated Thrombocytopenia- platelets- 113; worsening PANCYTOPENIA- April 2018- BMBx- no evidence of malignancy; MDS/Acute leuk; Karyotype/MDS- FISH panel-NEG [d/w Dr.Smir];   # June 2018- REPEAT BMBx/[facility] [Dr.Foster]; No Diagnosis;   # 2006- RIGHT BREAST CA [pT1cN0M0; STAGE I; ER/PRPos; Her 2 Neu-NEG] s/p FEC x 6 [NSABP B-36]; Femara [Dec 2007-2012]  # Osteoporosis s/p Reclast; BMD- 2015-wnl- ca+vit D         Carcinoma of overlapping sites of right breast in female, estrogen receptor positive ([place])     INTERVAL HISTORY:  A very pleasant 71 year old female patient With newly diagnosed recurrent breast cancer- ER/PR positive HER-2/neu negative currently on Femara; And pancytopenia/ with more severe thrombocytopenia- currently on N-plate is here for follow-up.  The interim patient underwent a repeat bone marrow biopsy at [facility]; evaluation with Dr. Royce Macadamia for her ongoing pancytopenia.  Patient last platelet transfusion was approximately 3 weeks ago. Patient denies any more. Denies any petechial rash. Denies any blood in stools or black stools. She continues to take Femara.  Patient denies any unusual shortness of breath or chest pain or cough. No weight loss. Appetite is good. Patient is excited about leaving for vacation to [facility] with her family in the week.  REVIEW OF SYSTEMS:  A complete 10 point  review of system is done which is negative except mentioned above/history of present illness.   PAST MEDICAL HISTORY :  Past Medical History:  Diagnosis Date  . Breast cancer ([place])    right, lumpectomy, radiation, chemo  . Breast cancer ([place])    Right, 2007  . Breast cancer ([place]) 06/20/2016   INVASIVE DUCTAL CARCINOMA.  . Headache   . History of chemotherapy   . History of radiation therapy   . Hyperlipidemia   . Hypertension   . Osteoarthritis    right hip  . Osteoporosis   . Squamous cell carcinoma    leg, Followed by Dr. Nicole Kindred    PAST SURGICAL HISTORY :   Past Surgical History:  Procedure Laterality Date  . ABDOMINAL HYSTERECTOMY    . BREAST BIOPSY Left 2002   left breast, calcifications  . BREAST BIOPSY Left 06/20/2016   INVASIVE DUCTAL CARCINOMA.  Marland Kitchen BREAST EXCISIONAL BIOPSY Right 2007   positive  . bunion repair    . ESOPHAGOGASTRODUODENOSCOPY (EGD) WITH PROPOFOL N/A 08/05/2016   Procedure: ESOPHAGOGASTRODUODENOSCOPY (EGD) WITH PROPOFOL;  Surgeon: San Jetty, MD;  Location: [facility] ENDOSCOPY;  Service: General;  Laterality: N/A;  . left breast biopsy    . NASAL SINUS SURGERY    . right hip replacement    . SHOULDER SURGERY    . SQUAMOUS CELL CARCINOMA EXCISION     right leg, Dr. Nicole Kindred  . TOTAL HIP ARTHROPLASTY     right    FAMILY HISTORY :   Family History  Problem Relation Age of Onset  . Heart disease Father 84  . Heart disease Mother   . Hyperlipidemia Mother   .  Heart disease Brother   . Diabetes Maternal Grandmother   . Breast cancer Cousin     SOCIAL HISTORY:   Social History  Substance Use Topics  . Smoking status: Current Some Day Smoker    Packs/day: 0.10    Years: 30.00    Types: Cigarettes  . Smokeless tobacco: Never Used  . Alcohol use 0.0 oz/week     Comment: socially    ALLERGIES:  is allergic to codeine; fish-derived products; morphine sulfate; fish allergy; morphine sulfate; and oxycodone.  MEDICATIONS:  Current  Outpatient Prescriptions  Medication Sig Dispense Refill  . ALPRAZolam (XANAX) 0.5 MG tablet Take 1 tablet (0.5 mg total) by mouth every 8 (eight) hours as needed for anxiety or sleep. 30 tablet 2  . Cholecalciferol (VITAMIN D3) 1000 units CAPS Take 1 capsule by mouth daily.     Marland Kitchen Fexofenadine HCl (ALLEGRA PO) Take 1 capsule by mouth.    . letrozole (FEMARA) 2.5 MG tablet Take 1 tablet (2.5 mg total) by mouth daily. Once a day. 90 tablet 3  . metoprolol succinate (TOPROL-XL) 25 MG 24 hr tablet Take 1 tablet (25 mg total) by mouth daily. 90 tablet 3  . pravastatin (PRAVACHOL) 40 MG tablet Take 1 tablet (40 mg total) by mouth daily. 90 tablet 3  . Probiotic Product (PROBIOTIC & ACIDOPHILUS EX ST PO) Take 1 tablet by mouth daily.     . ranitidine (ZANTAC) 150 MG capsule Take 150 mg by mouth 2 (two) times daily.    . Abemaciclib 150 MG TABS Take 150 mg by mouth every 12 (twelve) hours. (Patient not taking: Reported on 08/21/2016) 60 tablet 6  . ondansetron (ZOFRAN) 8 MG tablet Take 1 tablet (8 mg total) by mouth every 8 (eight) hours as needed for nausea or vomiting. (Patient not taking: Reported on 08/21/2016) 20 tablet 4  . triamcinolone ointment (KENALOG) 0.5 % Apply 1 application topically 2 (two) times daily. (Patient not taking: Reported on 08/21/2016) 30 g 0   No current facility-administered medications for this visit.     PHYSICAL EXAMINATION: ECOG PERFORMANCE STATUS: 0 - Asymptomatic  BP (!) 141/80 (Patient Position: Sitting)   Pulse 90   Temp 97.6 F (36.4 C) (Tympanic)   Resp 18   Ht 5' 1"  (1.549 m)   Wt 155 lb (70.3 kg)   BMI 29.29 kg/m   Filed Weights   09/06/16 0959  Weight: 155 lb (70.3 kg)    GENERAL: Well-nourished well-developed; Alert, no distress and comfortable.  Accompanied by Her husband. EYES: WNL.  OROPHARYNX: no thrush or ulceration; good dentition. No bleeding noted. NECK: supple, no masses felt LYMPH:  no palpable lymphadenopathy in the cervical, axillary  or inguinal regions LUNGS: clear to auscultation and  No wheeze or crackles HEART/CVS: regular rate & rhythm and no murmurs; No lower extremity edema ABDOMEN:abdomen soft, non-tender and normal bowel sounds Musculoskeletal:no cyanosis of digits and no clubbing  PSYCH: alert & oriented x 3 with fluent speech NEURO: no focal motor/sensory deficits SKIN:  No rash noted. Subcutaneous metastatic nodules felt. Improving.   LABORATORY DATA:  I have reviewed the data as listed    Component Value Date/Time   NA 130 (L) 08/04/2016 0445   K 4.0 08/04/2016 0445   CL 98 (L) 08/04/2016 0445   CO2 24 08/04/2016 0445   GLUCOSE 116 (H) 08/04/2016 0445   BUN 9 08/04/2016 0445   CREATININE 0.69 08/04/2016 0445   CREATININE 0.67 07/09/2014 1321   CREATININE 0.70 04/30/2014  1455   CALCIUM 8.6 (L) 08/04/2016 0445   PROT 8.0 08/03/2016 1453   PROT 7.3 07/09/2014 1321   ALBUMIN 3.7 08/03/2016 1453   ALBUMIN 4.3 07/09/2014 1321   AST 25 08/03/2016 1453   AST 19 07/09/2014 1321   ALT 16 08/03/2016 1453   ALT 18 07/09/2014 1321   ALKPHOS 64 08/03/2016 1453   ALKPHOS 66 07/09/2014 1321   BILITOT 0.7 08/03/2016 1453   BILITOT 0.6 07/09/2014 1321   GFRNONAA >60 08/04/2016 0445   GFRNONAA >60 07/09/2014 1321   GFRAA >60 08/04/2016 0445   GFRAA >60 07/09/2014 1321    No results found for: SPEP, UPEP  Lab Results  Component Value Date   WBC 2.7 (L) 09/06/2016   NEUTROABS 1.5 09/06/2016   HGB 8.4 (L) 09/06/2016   HCT 23.3 (L) 09/06/2016   MCV 104.2 (H) 09/06/2016   PLT 36 (L) 09/06/2016      Chemistry      Component Value Date/Time   NA 130 (L) 08/04/2016 0445   K 4.0 08/04/2016 0445   CL 98 (L) 08/04/2016 0445   CO2 24 08/04/2016 0445   BUN 9 08/04/2016 0445   CREATININE 0.69 08/04/2016 0445   CREATININE 0.67 07/09/2014 1321   CREATININE 0.70 04/30/2014 1455      Component Value Date/Time   CALCIUM 8.6 (L) 08/04/2016 0445   ALKPHOS 64 08/03/2016 1453   ALKPHOS 66 07/09/2014 1321    AST 25 08/03/2016 1453   AST 19 07/09/2014 1321   ALT 16 08/03/2016 1453   ALT 18 07/09/2014 1321   BILITOT 0.7 08/03/2016 1453   BILITOT 0.6 07/09/2014 1321        ASSESSMENT & PLAN:   Acute ITP ([place]) # Severe thrombocytopenia/leukopenia/anemia- highly suspicious for MDS; however repeat bone marrow biopsy/evaluation at [facility] negative for any obvious cause of her severe thrombocytopenia. For now continue treatment with N-plate on a weekly basis. Also discussed regarding opinion regarding her hematologic problem with different Center ? [facility] in [place] [pt's son lives in [place]  # Patient's platelets 36 today; going for a vacation for one week to [facility]. Recommend platelet transfusion today to avoid GI bleed.  # GI bleed secondary to severe thrombocytopenia. Hemoglobin stable around 8.5 monitor for now.   #  Stage IV recurrent/metastatic. Left chest wall/upper outer breast nodule biopsy- positive for invasive ductal carcinoma- ER/PR positive HER-2/neu negative breast cancer. continue Femara for now. Discontinue CDK inhibitor sec to pancytopenia.  #Repeat CBC on July 9th & 16th & 23rd/N-plate; follow up with me on 23rd.      [name], MD 09/07/2016 8:11 PM

## 2016-09-06 NOTE — Assessment & Plan Note (Deleted)
#  Stage IV recurrent/metastatic. Left chest wall/upper outer breast nodule biopsy- positive for invasive ductal carcinoma- ER/PR positive HER-2/neu negative breast cancer. Bx of right chest wall- positive for [facility]; ER/PR-pos; Her-2 neu-NEG.  continue Femara at this time. Continue to hold Abemaciclib at this time given severe thrombocyopenia [see discussion below]  # Severe thrombocytopenia/leukopenia/anemia- highly suspicious for MDS; however recent bone marrow biopsy negative. Awaiting repeat bone marrow biopsy at [facility] on May 31st; appointment by Dr. Royce Macadamia on June 6. Continue N-plate [weekly; last received on 5/29th-platelets 48].  # Recent admission for melanotic stools- question GI bleed versus swallowed blood from epistaxis. EGD negative. Recommend keeping the platelets above 30,000.   # platelet transfusion tomorrow; lab/ Nplate on 4/9; weekly cbc/N-plate; MD in 3 weeks [16th july]... Scans in end of Aug 2018. Marland KitchenMarland Kitchen

## 2016-09-06 NOTE — Assessment & Plan Note (Addendum)
#  Severe thrombocytopenia/leukopenia/anemia- highly suspicious for MDS; however repeat bone marrow biopsy/evaluation at [facility]- negative for any obvious cause of her severe thrombocytopenia. For now continue treatment with N-plate on a weekly basis. Also discussed regarding opinion regarding her hematologic problem with different Center ? [facility] in [place] [pt's son lives in [place]  # Patient's platelets 36 today; going for a vacation for one week to [facility]. Recommend platelet transfusion today to avoid GI bleed.  # GI bleed secondary to severe thrombocytopenia. Hemoglobin stable around 8.5 monitor for now.   #  Stage IV recurrent/metastatic. Left chest wall/upper outer breast nodule biopsy- positive for invasive ductal carcinoma- ER/PR positive HER-2/neu negative breast cancer. continue Femara for now. Discontinue CDK inhibitor sec to pancytopenia.  #Repeat CBC on July 9th & 16th & 23rd/N-plate; follow up with me on 23rd.

## 2016-09-07 ENCOUNTER — Telehealth: Payer: Self-pay | Admitting: *Deleted

## 2016-09-07 LAB — PREPARE PLATELET PHERESIS: UNIT DIVISION: 0

## 2016-09-07 LAB — BPAM PLATELET PHERESIS
BLOOD PRODUCT EXPIRATION DATE: 201806302359
ISSUE DATE / TIME: 201806281404
UNIT TYPE AND RH: 5100

## 2016-09-07 NOTE — Telephone Encounter (Signed)
Asking that [facility] e-mail her results from yesterday to her to take on her trip

## 2016-09-07 NOTE — Telephone Encounter (Signed)
These labs are released to her mychart now.

## 2016-09-10 ENCOUNTER — Other Ambulatory Visit: Payer: Medicare Other

## 2016-09-10 ENCOUNTER — Ambulatory Visit: Payer: Medicare Other | Admitting: Internal Medicine

## 2016-09-10 ENCOUNTER — Ambulatory Visit: Payer: Medicare Other

## 2016-09-11 DIAGNOSIS — D693 Immune thrombocytopenic purpura: Secondary | ICD-10-CM | POA: Diagnosis not present

## 2016-09-17 ENCOUNTER — Other Ambulatory Visit: Payer: Self-pay

## 2016-09-17 ENCOUNTER — Other Ambulatory Visit: Payer: Self-pay | Admitting: *Deleted

## 2016-09-17 ENCOUNTER — Other Ambulatory Visit: Payer: Self-pay | Admitting: Internal Medicine

## 2016-09-17 ENCOUNTER — Inpatient Hospital Stay: Payer: Medicare Other | Attending: Internal Medicine

## 2016-09-17 ENCOUNTER — Inpatient Hospital Stay: Payer: Medicare Other

## 2016-09-17 DIAGNOSIS — Z9221 Personal history of antineoplastic chemotherapy: Secondary | ICD-10-CM | POA: Insufficient documentation

## 2016-09-17 DIAGNOSIS — C50811 Malignant neoplasm of overlapping sites of right female breast: Secondary | ICD-10-CM

## 2016-09-17 DIAGNOSIS — Z803 Family history of malignant neoplasm of breast: Secondary | ICD-10-CM | POA: Diagnosis not present

## 2016-09-17 DIAGNOSIS — Z170 Estrogen receptor positive status [ER+]: Secondary | ICD-10-CM

## 2016-09-17 DIAGNOSIS — I10 Essential (primary) hypertension: Secondary | ICD-10-CM | POA: Insufficient documentation

## 2016-09-17 DIAGNOSIS — Z79818 Long term (current) use of other agents affecting estrogen receptors and estrogen levels: Secondary | ICD-10-CM | POA: Insufficient documentation

## 2016-09-17 DIAGNOSIS — D696 Thrombocytopenia, unspecified: Secondary | ICD-10-CM | POA: Diagnosis not present

## 2016-09-17 DIAGNOSIS — Z79899 Other long term (current) drug therapy: Secondary | ICD-10-CM | POA: Insufficient documentation

## 2016-09-17 DIAGNOSIS — D61818 Other pancytopenia: Secondary | ICD-10-CM | POA: Insufficient documentation

## 2016-09-17 DIAGNOSIS — M810 Age-related osteoporosis without current pathological fracture: Secondary | ICD-10-CM | POA: Insufficient documentation

## 2016-09-17 DIAGNOSIS — D693 Immune thrombocytopenic purpura: Secondary | ICD-10-CM

## 2016-09-17 DIAGNOSIS — F17210 Nicotine dependence, cigarettes, uncomplicated: Secondary | ICD-10-CM | POA: Insufficient documentation

## 2016-09-17 DIAGNOSIS — Z85828 Personal history of other malignant neoplasm of skin: Secondary | ICD-10-CM | POA: Insufficient documentation

## 2016-09-17 DIAGNOSIS — D649 Anemia, unspecified: Secondary | ICD-10-CM

## 2016-09-17 DIAGNOSIS — Z923 Personal history of irradiation: Secondary | ICD-10-CM | POA: Insufficient documentation

## 2016-09-17 DIAGNOSIS — M1990 Unspecified osteoarthritis, unspecified site: Secondary | ICD-10-CM | POA: Insufficient documentation

## 2016-09-17 DIAGNOSIS — E785 Hyperlipidemia, unspecified: Secondary | ICD-10-CM | POA: Diagnosis not present

## 2016-09-17 LAB — CBC
HCT: 21.4 % — ABNORMAL LOW (ref 35.0–47.0)
HEMOGLOBIN: 7.7 g/dL — AB (ref 12.0–16.0)
MCH: 38.7 pg — ABNORMAL HIGH (ref 26.0–34.0)
MCHC: 35.9 g/dL (ref 32.0–36.0)
MCV: 107.9 fL — ABNORMAL HIGH (ref 80.0–100.0)
Platelets: 33 10*3/uL — ABNORMAL LOW (ref 150–440)
RBC: 1.99 MIL/uL — AB (ref 3.80–5.20)
RDW: 23.6 % — ABNORMAL HIGH (ref 11.5–14.5)
WBC: 3.8 10*3/uL (ref 3.6–11.0)

## 2016-09-17 MED ORDER — ROMIPLOSTIM INJECTION 500 MCG
4.0000 ug/kg | Freq: Once | SUBCUTANEOUS | Status: AC
  Administered 2016-09-17: 280 ug via SUBCUTANEOUS
  Filled 2016-09-17: qty 0.56

## 2016-09-21 ENCOUNTER — Inpatient Hospital Stay: Payer: Medicare Other

## 2016-09-21 ENCOUNTER — Other Ambulatory Visit: Payer: Self-pay | Admitting: *Deleted

## 2016-09-21 DIAGNOSIS — D649 Anemia, unspecified: Secondary | ICD-10-CM

## 2016-09-21 DIAGNOSIS — Z170 Estrogen receptor positive status [ER+]: Principal | ICD-10-CM

## 2016-09-21 DIAGNOSIS — Z79818 Long term (current) use of other agents affecting estrogen receptors and estrogen levels: Secondary | ICD-10-CM | POA: Diagnosis not present

## 2016-09-21 DIAGNOSIS — C50811 Malignant neoplasm of overlapping sites of right female breast: Secondary | ICD-10-CM | POA: Diagnosis not present

## 2016-09-21 DIAGNOSIS — D61818 Other pancytopenia: Secondary | ICD-10-CM | POA: Diagnosis not present

## 2016-09-21 DIAGNOSIS — D696 Thrombocytopenia, unspecified: Secondary | ICD-10-CM | POA: Diagnosis not present

## 2016-09-21 DIAGNOSIS — Z79899 Other long term (current) drug therapy: Secondary | ICD-10-CM | POA: Diagnosis not present

## 2016-09-21 LAB — IRON AND TIBC
Iron: 135 ug/dL (ref 28–170)
SATURATION RATIOS: 45 % — AB (ref 10.4–31.8)
TIBC: 301 ug/dL (ref 250–450)
UIBC: 166 ug/dL

## 2016-09-21 LAB — CBC WITH DIFFERENTIAL/PLATELET
BASOS ABS: 0 10*3/uL (ref 0–0.1)
Basophils Relative: 0 %
Eosinophils Absolute: 0.1 10*3/uL (ref 0–0.7)
Eosinophils Relative: 2 %
HEMATOCRIT: 20.5 % — AB (ref 35.0–47.0)
HEMOGLOBIN: 7.4 g/dL — AB (ref 12.0–16.0)
LYMPHS ABS: 1 10*3/uL (ref 1.0–3.6)
LYMPHS PCT: 32 %
MCH: 39.2 pg — ABNORMAL HIGH (ref 26.0–34.0)
MCHC: 36.1 g/dL — ABNORMAL HIGH (ref 32.0–36.0)
MCV: 108.7 fL — AB (ref 80.0–100.0)
Monocytes Absolute: 0.2 10*3/uL (ref 0.2–0.9)
Monocytes Relative: 7 %
NEUTROS ABS: 1.8 10*3/uL (ref 1.4–6.5)
NEUTROS PCT: 59 %
PLATELETS: 30 10*3/uL — AB (ref 150–440)
RBC: 1.88 MIL/uL — AB (ref 3.80–5.20)
RDW: 23.3 % — ABNORMAL HIGH (ref 11.5–14.5)
WBC: 3 10*3/uL — AB (ref 3.6–11.0)

## 2016-09-21 LAB — SAMPLE TO BLOOD BANK

## 2016-09-21 LAB — FERRITIN: Ferritin: 394 ng/mL — ABNORMAL HIGH (ref 11–307)

## 2016-09-21 LAB — PREPARE RBC (CROSSMATCH)

## 2016-09-21 MED ORDER — DIPHENHYDRAMINE HCL 25 MG PO CAPS
25.0000 mg | ORAL_CAPSULE | Freq: Once | ORAL | Status: AC
  Administered 2016-09-21: 25 mg via ORAL
  Filled 2016-09-21: qty 1

## 2016-09-21 MED ORDER — ACETAMINOPHEN 325 MG PO TABS
650.0000 mg | ORAL_TABLET | Freq: Once | ORAL | Status: AC
  Administered 2016-09-21: 650 mg via ORAL
  Filled 2016-09-21: qty 2

## 2016-09-21 MED ORDER — METHYLPREDNISOLONE SODIUM SUCC 125 MG IJ SOLR
40.0000 mg | Freq: Once | INTRAMUSCULAR | Status: AC
  Administered 2016-09-21: 40 mg via INTRAVENOUS
  Filled 2016-09-21: qty 2

## 2016-09-21 MED ORDER — SODIUM CHLORIDE 0.9 % IV SOLN
250.0000 mL | Freq: Once | INTRAVENOUS | Status: AC
  Administered 2016-09-21: 250 mL via INTRAVENOUS
  Filled 2016-09-21: qty 250

## 2016-09-23 LAB — BPAM RBC
BLOOD PRODUCT EXPIRATION DATE: 201807292359
ISSUE DATE / TIME: 201807131429
UNIT TYPE AND RH: 5100

## 2016-09-23 LAB — TYPE AND SCREEN
ABO/RH(D): O POS
Antibody Screen: POSITIVE
Donor AG Type: NEGATIVE
UNIT DIVISION: 0

## 2016-09-24 ENCOUNTER — Inpatient Hospital Stay (HOSPITAL_BASED_OUTPATIENT_CLINIC_OR_DEPARTMENT_OTHER): Payer: Medicare Other | Admitting: Internal Medicine

## 2016-09-24 ENCOUNTER — Inpatient Hospital Stay: Payer: Medicare Other

## 2016-09-24 VITALS — BP 146/84 | HR 108 | Temp 98.2°F | Resp 18 | Wt 152.4 lb

## 2016-09-24 DIAGNOSIS — Z170 Estrogen receptor positive status [ER+]: Secondary | ICD-10-CM

## 2016-09-24 DIAGNOSIS — Z79818 Long term (current) use of other agents affecting estrogen receptors and estrogen levels: Secondary | ICD-10-CM | POA: Diagnosis not present

## 2016-09-24 DIAGNOSIS — Z923 Personal history of irradiation: Secondary | ICD-10-CM

## 2016-09-24 DIAGNOSIS — C50811 Malignant neoplasm of overlapping sites of right female breast: Secondary | ICD-10-CM

## 2016-09-24 DIAGNOSIS — D696 Thrombocytopenia, unspecified: Secondary | ICD-10-CM | POA: Diagnosis not present

## 2016-09-24 DIAGNOSIS — Z9221 Personal history of antineoplastic chemotherapy: Secondary | ICD-10-CM | POA: Diagnosis not present

## 2016-09-24 DIAGNOSIS — M810 Age-related osteoporosis without current pathological fracture: Secondary | ICD-10-CM | POA: Diagnosis not present

## 2016-09-24 DIAGNOSIS — F17210 Nicotine dependence, cigarettes, uncomplicated: Secondary | ICD-10-CM

## 2016-09-24 DIAGNOSIS — D693 Immune thrombocytopenic purpura: Secondary | ICD-10-CM

## 2016-09-24 DIAGNOSIS — Z79899 Other long term (current) drug therapy: Secondary | ICD-10-CM | POA: Diagnosis not present

## 2016-09-24 DIAGNOSIS — D469 Myelodysplastic syndrome, unspecified: Secondary | ICD-10-CM | POA: Insufficient documentation

## 2016-09-24 DIAGNOSIS — I10 Essential (primary) hypertension: Secondary | ICD-10-CM | POA: Diagnosis not present

## 2016-09-24 DIAGNOSIS — Z803 Family history of malignant neoplasm of breast: Secondary | ICD-10-CM

## 2016-09-24 DIAGNOSIS — E785 Hyperlipidemia, unspecified: Secondary | ICD-10-CM

## 2016-09-24 DIAGNOSIS — M1990 Unspecified osteoarthritis, unspecified site: Secondary | ICD-10-CM | POA: Diagnosis not present

## 2016-09-24 DIAGNOSIS — D61818 Other pancytopenia: Secondary | ICD-10-CM | POA: Diagnosis not present

## 2016-09-24 DIAGNOSIS — Z85828 Personal history of other malignant neoplasm of skin: Secondary | ICD-10-CM

## 2016-09-24 LAB — CBC WITH DIFFERENTIAL/PLATELET
Basophils Absolute: 0 10*3/uL (ref 0–0.1)
Basophils Relative: 0 %
Eosinophils Absolute: 0 10*3/uL (ref 0–0.7)
Eosinophils Relative: 1 %
HEMATOCRIT: 28.4 % — AB (ref 35.0–47.0)
HEMOGLOBIN: 10.1 g/dL — AB (ref 12.0–16.0)
LYMPHS ABS: 1.2 10*3/uL (ref 1.0–3.6)
Lymphocytes Relative: 30 %
MCH: 37.7 pg — AB (ref 26.0–34.0)
MCHC: 35.4 g/dL (ref 32.0–36.0)
MCV: 106.6 fL — AB (ref 80.0–100.0)
MONOS PCT: 8 %
Monocytes Absolute: 0.3 10*3/uL (ref 0.2–0.9)
NEUTROS ABS: 2.5 10*3/uL (ref 1.4–6.5)
NEUTROS PCT: 61 %
Platelets: 30 10*3/uL — ABNORMAL LOW (ref 150–440)
RBC: 2.66 MIL/uL — ABNORMAL LOW (ref 3.80–5.20)
RDW: 22.4 % — ABNORMAL HIGH (ref 11.5–14.5)
WBC: 4 10*3/uL (ref 3.6–11.0)

## 2016-09-24 LAB — SAMPLE TO BLOOD BANK

## 2016-09-24 MED ORDER — ROMIPLOSTIM INJECTION 500 MCG
5.0000 ug/kg | Freq: Once | SUBCUTANEOUS | Status: AC
  Administered 2016-09-24: 345 ug via SUBCUTANEOUS
  Filled 2016-09-24: qty 0.69

## 2016-09-24 NOTE — Assessment & Plan Note (Addendum)
#  Severe thrombocytopenia/leukopenia/anemia- highly suspicious for MDS; however repeat bone marrow biopsy/evaluation at [facility]- negative for any obvious cause of her severe thrombocytopenia.  Patient's platelets 30 today; on N-plate q weekly; we will increase the dose to 5 mics per kilogram weekly.  # Severe anemia Anemia-needing PRBC transfusion last week. Given the lack of diagnosis 4 anemia and thrombocytopenia- I agree with evaluation at [facility]; Dr.Powell.  Today hemoglobin is 10.3. Hold transfusion. Will check re: aranesp. Not Iron deficient.   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 continue Femara for now. We will get a PET scan in approximately month for restaging.  # Blood press elevation- systolic in 140s. Recommend checking blood pressure at home and bring a log.   # weekly cbc/endplate; CBC on 26th-hold tube- possible / [place] 1st aug; follow-up with me approximately 4 weeks/PET scan prior.  # follow up in 4 weeks/labs/N-platelet.  

## 2016-09-24 NOTE — Progress Notes (Signed)
Patient is having nasal drainage and went to urgent care yesterday but they would not recommend treatment due to medical history.

## 2016-09-24 NOTE — Progress Notes (Signed)
[place] OFFICE PROGRESS NOTE  Patient Care Team: Coral Spikes, DO as PCP - General (Family Medicine) [name], DPM as Consulting Physician (Podiatry) [name], MD as Consulting Physician (Internal Medicine) Robert Bellow, MD (General Surgery)   SUMMARY OF ONCOLOGIC HISTORY:  Oncology History   # April 2018- Left chest wall Bx- IMC; ER-PR-POS; her 2 Neu NEG; PET- mild hilar/distal adenopathy; ~1 cm right lung nodule/ several sub-centimeter.   # May 2018[facility]; Abemacliclib [on HOLD sec to cytopenia]  # April 2017-isolated Thrombocytopenia- platelets- 113; worsening PANCYTOPENIA- April 2018- BMBx- no evidence of malignancy; MDS/Acute leuk; Karyotype/MDS- FISH panel-NEG [d/w Dr.Smir];   # June 2018- REPEAT BMBx/[facility] [Dr.Foster]; No Diagnosis;   # 2006- RIGHT BREAST CA [pT1cN0M0; STAGE I; ER/PRPos; Her 2 Neu-NEG] s/p FEC x 6 [NSABP B-36]; Femara [Dec 2007-2012]  # Osteoporosis s/p Reclast; BMD- 2015-wnl- ca+vit D         Carcinoma of overlapping sites of right breast in female, estrogen receptor positive ([place])     INTERVAL HISTORY:  A very pleasant 71 year old female patient With newly diagnosed recurrent breast cancer- ER/PR positive HER-2/neu negative currently on Femara; And pancytopenia/ with more severe thrombocytopenia- currently on N-plate is here for follow-up.  In the interim patient has returned from [place], [place]. Was uneventful.  Since returning last week patient needed 1 PRBC blood transfusion for hemoglobin 7.7; has not taken any platelet transfusions.   Patient had episode of bluish discoloration of her index finger in the right side; currently resolved.  Denies any petechial rash. Denies any blood in stools or black stools. She continues to take Femara. Patient denies any unusual shortness of breath or chest pain or cough. No weight loss. Appetite is good.   Patient was seen in the urgent care clinic  yesterday- because of sinus stuffiness. Otherwise denies any fevers or chills.  REVIEW OF SYSTEMS:  A complete 10 point review of system is done which is negative except mentioned above/history of present illness.   PAST MEDICAL HISTORY :  Past Medical History:  Diagnosis Date  . Breast cancer ([place])    right, lumpectomy, radiation, chemo  . Breast cancer ([place])    Right, 2007  . Breast cancer ([place]) 06/20/2016   INVASIVE DUCTAL CARCINOMA.  . Headache   . History of chemotherapy   . History of radiation therapy   . Hyperlipidemia   . Hypertension   . Osteoarthritis    right hip  . Osteoporosis   . Squamous cell carcinoma    leg, Followed by Dr. Nicole Kindred    PAST SURGICAL HISTORY :   Past Surgical History:  Procedure Laterality Date  . ABDOMINAL HYSTERECTOMY    . BREAST BIOPSY Left 2002   left breast, calcifications  . BREAST BIOPSY Left 06/20/2016   INVASIVE DUCTAL CARCINOMA.  Marland Kitchen BREAST EXCISIONAL BIOPSY Right 2007   positive  . bunion repair    . ESOPHAGOGASTRODUODENOSCOPY (EGD) WITH PROPOFOL N/A 08/05/2016   Procedure: ESOPHAGOGASTRODUODENOSCOPY (EGD) WITH PROPOFOL;  Surgeon: San Jetty, MD;  Location: [facility] ENDOSCOPY;  Service: General;  Laterality: N/A;  . left breast biopsy    . NASAL SINUS SURGERY    . right hip replacement    . SHOULDER SURGERY    . SQUAMOUS CELL CARCINOMA EXCISION     right leg, Dr. Nicole Kindred  . TOTAL HIP ARTHROPLASTY     right    FAMILY HISTORY :   Family History  Problem Relation Age of Onset  .  Heart disease Father 23  . Heart disease Mother   . Hyperlipidemia Mother   . Heart disease Brother   . Diabetes Maternal Grandmother   . Breast cancer Cousin     SOCIAL HISTORY:   Social History  Substance Use Topics  . Smoking status: Current Some Day Smoker    Packs/day: 0.10    Years: 30.00    Types: Cigarettes  . Smokeless tobacco: Never Used  . Alcohol use 0.0 oz/week     Comment: socially    ALLERGIES:  is allergic to codeine;  fish-derived products; morphine sulfate; fish allergy; morphine sulfate; and oxycodone.  MEDICATIONS:  Current Outpatient Prescriptions  Medication Sig Dispense Refill  . ALPRAZolam (XANAX) 0.5 MG tablet Take 1 tablet (0.5 mg total) by mouth every 8 (eight) hours as needed for anxiety or sleep. 30 tablet 2  . Cholecalciferol (VITAMIN D3) 1000 units CAPS Take 1 capsule by mouth daily.     Marland Kitchen Fexofenadine HCl (ALLEGRA PO) Take 1 capsule by mouth.    . letrozole (FEMARA) 2.5 MG tablet Take 1 tablet (2.5 mg total) by mouth daily. Once a day. 90 tablet 3  . metoprolol succinate (TOPROL-XL) 25 MG 24 hr tablet Take 1 tablet (25 mg total) by mouth daily. 90 tablet 3  . pravastatin (PRAVACHOL) 40 MG tablet Take 1 tablet (40 mg total) by mouth daily. 90 tablet 3  . Probiotic Product (PROBIOTIC & ACIDOPHILUS EX ST PO) Take 1 tablet by mouth daily.     . ranitidine (ZANTAC) 150 MG capsule Take 150 mg by mouth 2 (two) times daily.    . Abemaciclib 150 MG TABS Take 150 mg by mouth every 12 (twelve) hours. (Patient not taking: Reported on 08/21/2016) 60 tablet 6  . ondansetron (ZOFRAN) 8 MG tablet Take 1 tablet (8 mg total) by mouth every 8 (eight) hours as needed for nausea or vomiting. (Patient not taking: Reported on 08/21/2016) 20 tablet 4  . triamcinolone ointment (KENALOG) 0.5 % Apply 1 application topically 2 (two) times daily. (Patient not taking: Reported on 08/21/2016) 30 g 0   No current facility-administered medications for this visit.     PHYSICAL EXAMINATION: ECOG PERFORMANCE STATUS: 0 - Asymptomatic  BP (!) 146/84   Pulse (!) 108   Temp 98.2 F (36.8 C) (Tympanic)   Resp 18   Wt 152 lb 6.4 oz (69.1 kg)   BMI 28.80 kg/m   Filed Weights   09/24/16 1125  Weight: 152 lb 6.4 oz (69.1 kg)    GENERAL: Well-nourished well-developed; Alert, no distress and comfortable.  Accompanied by Her husband. EYES: WNL.  OROPHARYNX: no thrush or ulceration; good dentition. No bleeding noted. NECK:  supple, no masses felt LYMPH:  no palpable lymphadenopathy in the cervical, axillary or inguinal regions LUNGS: clear to auscultation and  No wheeze or crackles HEART/CVS: regular rate & rhythm and no murmurs; No lower extremity edema ABDOMEN:abdomen soft, non-tender and normal bowel sounds Musculoskeletal:no cyanosis of digits and no clubbing  PSYCH: alert & oriented x 3 with fluent speech NEURO: no focal motor/sensory deficits SKIN:  No rash noted. Subcutaneous metastatic nodules felt. Improving.   LABORATORY DATA:  I have reviewed the data as listed    Component Value Date/Time   NA 130 (L) 08/04/2016 0445   K 4.0 08/04/2016 0445   CL 98 (L) 08/04/2016 0445   CO2 24 08/04/2016 0445   GLUCOSE 116 (H) 08/04/2016 0445   BUN 9 08/04/2016 0445   CREATININE 0.69 08/04/2016  0445   CREATININE 0.67 07/09/2014 1321   CREATININE 0.70 04/30/2014 1455   CALCIUM 8.6 (L) 08/04/2016 0445   PROT 8.0 08/03/2016 1453   PROT 7.3 07/09/2014 1321   ALBUMIN 3.7 08/03/2016 1453   ALBUMIN 4.3 07/09/2014 1321   AST 25 08/03/2016 1453   AST 19 07/09/2014 1321   ALT 16 08/03/2016 1453   ALT 18 07/09/2014 1321   ALKPHOS 64 08/03/2016 1453   ALKPHOS 66 07/09/2014 1321   BILITOT 0.7 08/03/2016 1453   BILITOT 0.6 07/09/2014 1321   GFRNONAA >60 08/04/2016 0445   GFRNONAA >60 07/09/2014 1321   GFRAA >60 08/04/2016 0445   GFRAA >60 07/09/2014 1321    No results found for: SPEP, UPEP  Lab Results  Component Value Date   WBC 4.0 09/24/2016   NEUTROABS 2.5 09/24/2016   HGB 10.1 (L) 09/24/2016   HCT 28.4 (L) 09/24/2016   MCV 106.6 (H) 09/24/2016   PLT 30 (L) 09/24/2016      Chemistry      Component Value Date/Time   NA 130 (L) 08/04/2016 0445   K 4.0 08/04/2016 0445   CL 98 (L) 08/04/2016 0445   CO2 24 08/04/2016 0445   BUN 9 08/04/2016 0445   CREATININE 0.69 08/04/2016 0445   CREATININE 0.67 07/09/2014 1321   CREATININE 0.70 04/30/2014 1455      Component Value Date/Time   CALCIUM  8.6 (L) 08/04/2016 0445   ALKPHOS 64 08/03/2016 1453   ALKPHOS 66 07/09/2014 1321   AST 25 08/03/2016 1453   AST 19 07/09/2014 1321   ALT 16 08/03/2016 1453   ALT 18 07/09/2014 1321   BILITOT 0.7 08/03/2016 1453   BILITOT 0.6 07/09/2014 1321        ASSESSMENT & PLAN:   Carcinoma of overlapping sites of right breast in female, estrogen receptor positive ([place]) # Severe thrombocytopenia/leukopenia/anemia- highly suspicious for MDS; however repeat bone marrow biopsy/evaluation at [facility]- negative for any obvious cause of her severe thrombocytopenia.  Patient's platelets 30 today; on N-plate q weekly; we will increase the dose to 5 mics per kilogram weekly.  # Severe anemia Anemia-needing PRBC transfusion last week. Given the lack of diagnosis 4 anemia and thrombocytopenia- I agree with evaluation at [facility]; Dr.Powell.  Today hemoglobin is 10.3. Hold transfusion. Will check re: aranesp. Not Iron deficient.   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 continue Femara for now. We will get a PET scan in approximately month for restaging.  # Blood press elevation- systolic in 195K. Recommend checking blood pressure at home and bring a log.   # weekly cbc/endplate; CBC on 26th-hold tube- possible / [place] 1st aug; follow-up with me approximately 4 weeks/PET scan prior.  # follow up in 4 weeks/labs/N-platelet.      [name], MD 09/24/2016 1:23 PM

## 2016-09-25 ENCOUNTER — Other Ambulatory Visit: Payer: Self-pay | Admitting: *Deleted

## 2016-09-25 DIAGNOSIS — C50811 Malignant neoplasm of overlapping sites of right female breast: Secondary | ICD-10-CM

## 2016-09-25 DIAGNOSIS — D693 Immune thrombocytopenic purpura: Secondary | ICD-10-CM

## 2016-09-25 DIAGNOSIS — Z170 Estrogen receptor positive status [ER+]: Principal | ICD-10-CM

## 2016-10-01 ENCOUNTER — Encounter: Payer: Self-pay | Admitting: *Deleted

## 2016-10-01 ENCOUNTER — Inpatient Hospital Stay: Payer: Medicare Other

## 2016-10-01 DIAGNOSIS — D693 Immune thrombocytopenic purpura: Secondary | ICD-10-CM

## 2016-10-01 DIAGNOSIS — C50811 Malignant neoplasm of overlapping sites of right female breast: Secondary | ICD-10-CM

## 2016-10-01 DIAGNOSIS — Z170 Estrogen receptor positive status [ER+]: Secondary | ICD-10-CM | POA: Diagnosis not present

## 2016-10-01 DIAGNOSIS — D696 Thrombocytopenia, unspecified: Secondary | ICD-10-CM | POA: Diagnosis not present

## 2016-10-01 DIAGNOSIS — D61818 Other pancytopenia: Secondary | ICD-10-CM | POA: Diagnosis not present

## 2016-10-01 DIAGNOSIS — Z79818 Long term (current) use of other agents affecting estrogen receptors and estrogen levels: Secondary | ICD-10-CM | POA: Diagnosis not present

## 2016-10-01 DIAGNOSIS — D469 Myelodysplastic syndrome, unspecified: Secondary | ICD-10-CM | POA: Diagnosis not present

## 2016-10-01 DIAGNOSIS — Z79899 Other long term (current) drug therapy: Secondary | ICD-10-CM | POA: Diagnosis not present

## 2016-10-01 LAB — CBC WITH DIFFERENTIAL/PLATELET
BASOS ABS: 0 10*3/uL (ref 0–0.1)
Basophils Relative: 1 %
EOS ABS: 0.1 10*3/uL (ref 0–0.7)
EOS PCT: 2 %
HCT: 27.8 % — ABNORMAL LOW (ref 35.0–47.0)
Hemoglobin: 9.9 g/dL — ABNORMAL LOW (ref 12.0–16.0)
LYMPHS PCT: 27 %
Lymphs Abs: 1 10*3/uL (ref 1.0–3.6)
MCH: 38.3 pg — ABNORMAL HIGH (ref 26.0–34.0)
MCHC: 35.7 g/dL (ref 32.0–36.0)
MCV: 107.2 fL — AB (ref 80.0–100.0)
MONO ABS: 0.2 10*3/uL (ref 0.2–0.9)
Monocytes Relative: 7 %
Neutro Abs: 2.3 10*3/uL (ref 1.4–6.5)
Neutrophils Relative %: 63 %
PLATELETS: 33 10*3/uL — AB (ref 150–440)
RBC: 2.59 MIL/uL — ABNORMAL LOW (ref 3.80–5.20)
RDW: 21.9 % — AB (ref 11.5–14.5)
WBC: 3.7 10*3/uL (ref 3.6–11.0)

## 2016-10-01 LAB — SAMPLE TO BLOOD BANK

## 2016-10-01 MED ORDER — ROMIPLOSTIM INJECTION 500 MCG
5.0000 ug/kg | Freq: Once | SUBCUTANEOUS | Status: AC
  Administered 2016-10-01: 345 ug via SUBCUTANEOUS
  Filled 2016-10-01: qty 0.69

## 2016-10-02 ENCOUNTER — Other Ambulatory Visit: Payer: Self-pay | Admitting: Internal Medicine

## 2016-10-02 DIAGNOSIS — F411 Generalized anxiety disorder: Secondary | ICD-10-CM

## 2016-10-04 ENCOUNTER — Inpatient Hospital Stay: Payer: Medicare Other

## 2016-10-04 ENCOUNTER — Telehealth: Payer: Self-pay | Admitting: *Deleted

## 2016-10-04 DIAGNOSIS — Z170 Estrogen receptor positive status [ER+]: Secondary | ICD-10-CM | POA: Diagnosis not present

## 2016-10-04 DIAGNOSIS — Z79818 Long term (current) use of other agents affecting estrogen receptors and estrogen levels: Secondary | ICD-10-CM | POA: Diagnosis not present

## 2016-10-04 DIAGNOSIS — C50811 Malignant neoplasm of overlapping sites of right female breast: Secondary | ICD-10-CM

## 2016-10-04 DIAGNOSIS — D696 Thrombocytopenia, unspecified: Secondary | ICD-10-CM | POA: Diagnosis not present

## 2016-10-04 DIAGNOSIS — D469 Myelodysplastic syndrome, unspecified: Secondary | ICD-10-CM

## 2016-10-04 DIAGNOSIS — D61818 Other pancytopenia: Secondary | ICD-10-CM | POA: Diagnosis not present

## 2016-10-04 DIAGNOSIS — Z79899 Other long term (current) drug therapy: Secondary | ICD-10-CM | POA: Diagnosis not present

## 2016-10-04 LAB — CBC WITH DIFFERENTIAL/PLATELET
Basophils Absolute: 0 10*3/uL (ref 0–0.1)
Basophils Relative: 0 %
Eosinophils Absolute: 0.1 10*3/uL (ref 0–0.7)
Eosinophils Relative: 2 %
HEMATOCRIT: 24.2 % — AB (ref 35.0–47.0)
HEMOGLOBIN: 8.7 g/dL — AB (ref 12.0–16.0)
LYMPHS ABS: 0.9 10*3/uL — AB (ref 1.0–3.6)
LYMPHS PCT: 27 %
MCH: 38.4 pg — ABNORMAL HIGH (ref 26.0–34.0)
MCHC: 35.9 g/dL (ref 32.0–36.0)
MCV: 106.8 fL — ABNORMAL HIGH (ref 80.0–100.0)
MONOS PCT: 7 %
Monocytes Absolute: 0.2 10*3/uL (ref 0.2–0.9)
NEUTROS ABS: 2.1 10*3/uL (ref 1.4–6.5)
NEUTROS PCT: 64 %
Platelets: 27 10*3/uL — CL (ref 150–400)
RBC: 2.27 MIL/uL — AB (ref 3.80–5.20)
RDW: 21.8 % — ABNORMAL HIGH (ref 11.5–14.5)
WBC: 3.3 10*3/uL — ABNORMAL LOW (ref 3.6–11.0)

## 2016-10-04 LAB — SAMPLE TO BLOOD BANK

## 2016-10-04 NOTE — Telephone Encounter (Signed)
Patient left a message that she wants Nira Conn to return her call

## 2016-10-04 NOTE — Telephone Encounter (Signed)
Critical plt count of 27 called by Lonnie at [facility]. Lab  @ 9:14 AM.   Dr. Rogue Bussing - informed at 0945 am. Read back process performed with lab tech and Dr. Jacinto Reap.

## 2016-10-04 NOTE — Telephone Encounter (Signed)
Call returned. Patient stated that she is very discouraged that her plt count is not coming up. She wonders why her "body is not maintaining the plts." explained that md would like to admin another nplate tomorrow since she is going out of town for a week. She gave verbal understanding to come tomorrow am for the nplate injection.  She has an apt at [facility] tomorrow for 2nd opinion.  She thanked me for listening to her concerns. I offered to let her talk to Dr. Rogue Bussing, but she stated that I reassured her concerns.

## 2016-10-04 NOTE — Progress Notes (Signed)
Lab results today with hemoglobin of 8.7 and platelets of 27.  Dr. Rogue Bussing advise no transfusion today but with patient going out of town next week he would like her to receive Nplate injection tomorrow.  Pharmacy is going to check with insurance to see if will cover 2 Nplate injections in the same week.

## 2016-10-05 ENCOUNTER — Inpatient Hospital Stay: Payer: Medicare Other

## 2016-10-05 DIAGNOSIS — D696 Thrombocytopenia, unspecified: Secondary | ICD-10-CM | POA: Diagnosis not present

## 2016-10-05 DIAGNOSIS — Z79818 Long term (current) use of other agents affecting estrogen receptors and estrogen levels: Secondary | ICD-10-CM | POA: Diagnosis not present

## 2016-10-05 DIAGNOSIS — Z170 Estrogen receptor positive status [ER+]: Secondary | ICD-10-CM

## 2016-10-05 DIAGNOSIS — Z79899 Other long term (current) drug therapy: Secondary | ICD-10-CM | POA: Diagnosis not present

## 2016-10-05 DIAGNOSIS — C50811 Malignant neoplasm of overlapping sites of right female breast: Secondary | ICD-10-CM

## 2016-10-05 DIAGNOSIS — D693 Immune thrombocytopenic purpura: Secondary | ICD-10-CM

## 2016-10-05 DIAGNOSIS — D61818 Other pancytopenia: Secondary | ICD-10-CM | POA: Diagnosis not present

## 2016-10-05 MED ORDER — ROMIPLOSTIM INJECTION 500 MCG
500.0000 ug | Freq: Once | SUBCUTANEOUS | Status: AC
  Administered 2016-10-05: 500 ug via SUBCUTANEOUS
  Filled 2016-10-05: qty 1

## 2016-10-05 NOTE — Progress Notes (Signed)
Patient got Nplate on 0/26 (59mcg/kg).  MD wants to give Nplate again at 3.7CHY/IF today 7/27.

## 2016-10-09 ENCOUNTER — Telehealth: Payer: Self-pay | Admitting: Internal Medicine

## 2016-10-09 DIAGNOSIS — E538 Deficiency of other specified B group vitamins: Secondary | ICD-10-CM | POA: Insufficient documentation

## 2016-10-09 NOTE — Telephone Encounter (Signed)
Labs dated for 10/15/16; and b12 injection comment added to patient's schedule

## 2016-10-09 NOTE — Telephone Encounter (Signed)
Discussed with Dr. Florene Glen at [facility]; who recommends B12 weekly injections; checking copper/zinc levels; [facility] flow.   Please have the labs done at next lab draw [i have ordered them]; and start b12 injection- with next visit/N-plate.

## 2016-10-10 ENCOUNTER — Telehealth: Payer: Self-pay | Admitting: *Deleted

## 2016-10-10 DIAGNOSIS — Z170 Estrogen receptor positive status [ER+]: Secondary | ICD-10-CM | POA: Diagnosis not present

## 2016-10-10 NOTE — Telephone Encounter (Signed)
Out patient lab called with critical results Platelet count 25,000. Writer notified B. Grandville Silos CMA

## 2016-10-10 NOTE — Telephone Encounter (Signed)
Per Dr. Rogue Bussing, no recommendations at this time. I left message for patient to return phone call.

## 2016-10-10 NOTE — Telephone Encounter (Signed)
Patient has been notified of Dr. Aletha Halim comments and verbalized understanding. Patient states she is having no active bleeding at this time. Patient notified to keep scheduled appt for Monday.

## 2016-10-15 ENCOUNTER — Inpatient Hospital Stay: Payer: Medicare Other

## 2016-10-15 ENCOUNTER — Inpatient Hospital Stay: Payer: Medicare Other | Attending: Internal Medicine

## 2016-10-15 DIAGNOSIS — E785 Hyperlipidemia, unspecified: Secondary | ICD-10-CM | POA: Diagnosis not present

## 2016-10-15 DIAGNOSIS — Z923 Personal history of irradiation: Secondary | ICD-10-CM | POA: Insufficient documentation

## 2016-10-15 DIAGNOSIS — D693 Immune thrombocytopenic purpura: Secondary | ICD-10-CM

## 2016-10-15 DIAGNOSIS — Z853 Personal history of malignant neoplasm of breast: Secondary | ICD-10-CM | POA: Diagnosis not present

## 2016-10-15 DIAGNOSIS — R911 Solitary pulmonary nodule: Secondary | ICD-10-CM | POA: Insufficient documentation

## 2016-10-15 DIAGNOSIS — Z79811 Long term (current) use of aromatase inhibitors: Secondary | ICD-10-CM | POA: Insufficient documentation

## 2016-10-15 DIAGNOSIS — C50811 Malignant neoplasm of overlapping sites of right female breast: Secondary | ICD-10-CM | POA: Diagnosis not present

## 2016-10-15 DIAGNOSIS — Z79899 Other long term (current) drug therapy: Secondary | ICD-10-CM | POA: Insufficient documentation

## 2016-10-15 DIAGNOSIS — R0602 Shortness of breath: Secondary | ICD-10-CM | POA: Insufficient documentation

## 2016-10-15 DIAGNOSIS — F17210 Nicotine dependence, cigarettes, uncomplicated: Secondary | ICD-10-CM | POA: Insufficient documentation

## 2016-10-15 DIAGNOSIS — D61818 Other pancytopenia: Secondary | ICD-10-CM | POA: Insufficient documentation

## 2016-10-15 DIAGNOSIS — Z170 Estrogen receptor positive status [ER+]: Secondary | ICD-10-CM

## 2016-10-15 DIAGNOSIS — M1990 Unspecified osteoarthritis, unspecified site: Secondary | ICD-10-CM | POA: Insufficient documentation

## 2016-10-15 DIAGNOSIS — I10 Essential (primary) hypertension: Secondary | ICD-10-CM | POA: Insufficient documentation

## 2016-10-15 DIAGNOSIS — D469 Myelodysplastic syndrome, unspecified: Secondary | ICD-10-CM

## 2016-10-15 DIAGNOSIS — Z9221 Personal history of antineoplastic chemotherapy: Secondary | ICD-10-CM | POA: Diagnosis not present

## 2016-10-15 DIAGNOSIS — M810 Age-related osteoporosis without current pathological fracture: Secondary | ICD-10-CM | POA: Insufficient documentation

## 2016-10-15 DIAGNOSIS — E538 Deficiency of other specified B group vitamins: Secondary | ICD-10-CM

## 2016-10-15 LAB — CBC WITH DIFFERENTIAL/PLATELET
BASOS ABS: 0 10*3/uL (ref 0–0.1)
BASOS PCT: 0 %
EOS ABS: 0.1 10*3/uL (ref 0–0.7)
Eosinophils Relative: 2 %
HCT: 22.7 % — ABNORMAL LOW (ref 35.0–47.0)
Hemoglobin: 8.2 g/dL — ABNORMAL LOW (ref 12.0–16.0)
Lymphocytes Relative: 32 %
Lymphs Abs: 0.9 10*3/uL — ABNORMAL LOW (ref 1.0–3.6)
MCH: 39.4 pg — ABNORMAL HIGH (ref 26.0–34.0)
MCHC: 36 g/dL (ref 32.0–36.0)
MCV: 109.2 fL — ABNORMAL HIGH (ref 80.0–100.0)
MONO ABS: 0.3 10*3/uL (ref 0.2–0.9)
MONOS PCT: 9 %
NEUTROS ABS: 1.7 10*3/uL (ref 1.4–6.5)
Neutrophils Relative %: 57 %
PLATELETS: 27 10*3/uL — AB (ref 150–400)
RBC: 2.08 MIL/uL — ABNORMAL LOW (ref 3.80–5.20)
RDW: 21.1 % — AB (ref 11.5–14.5)
WBC: 3 10*3/uL — ABNORMAL LOW (ref 3.6–11.0)

## 2016-10-15 LAB — SAMPLE TO BLOOD BANK

## 2016-10-15 MED ORDER — CYANOCOBALAMIN 1000 MCG/ML IJ SOLN
1000.0000 ug | Freq: Once | INTRAMUSCULAR | Status: AC
  Administered 2016-10-15: 1000 ug via INTRAMUSCULAR
  Filled 2016-10-15: qty 1

## 2016-10-15 MED ORDER — ROMIPLOSTIM INJECTION 500 MCG
690.0000 ug | Freq: Once | SUBCUTANEOUS | Status: AC
Start: 2016-10-15 — End: 2016-10-15
  Administered 2016-10-15: 690 ug via SUBCUTANEOUS
  Filled 2016-10-15: qty 1

## 2016-10-15 NOTE — Progress Notes (Signed)
Plt =27.  Last dose of Nplate given was 7.2BMB/OM.  Called MD to verify dose to be given today.  MD wants to give 50mcg/kg today.

## 2016-10-17 LAB — ZINC: ZINC: 68 ug/dL (ref 56–134)

## 2016-10-17 LAB — COPPER, SERUM: Copper: 154 ug/dL (ref 72–166)

## 2016-10-19 ENCOUNTER — Telehealth: Payer: Self-pay | Admitting: Internal Medicine

## 2016-10-19 ENCOUNTER — Encounter
Admission: RE | Admit: 2016-10-19 | Discharge: 2016-10-19 | Disposition: A | Discharge status: Home / Self Care | Payer: Medicare Other | Source: Ambulatory Visit | Attending: Internal Medicine | Admitting: Internal Medicine

## 2016-10-19 DIAGNOSIS — C50811 Malignant neoplasm of overlapping sites of right female breast: Secondary | ICD-10-CM | POA: Diagnosis not present

## 2016-10-19 DIAGNOSIS — Z170 Estrogen receptor positive status [ER+]: Secondary | ICD-10-CM | POA: Insufficient documentation

## 2016-10-19 DIAGNOSIS — C50911 Malignant neoplasm of unspecified site of right female breast: Secondary | ICD-10-CM | POA: Diagnosis not present

## 2016-10-19 LAB — GLUCOSE, CAPILLARY: Glucose-Capillary: 95 mg/dL (ref 65–99)

## 2016-10-19 MED ORDER — FLUDEOXYGLUCOSE F - 18 (FDG) INJECTION
12.7200 | Freq: Once | INTRAVENOUS | Status: AC | PRN
  Administered 2016-10-19: 12.72 via INTRAVENOUS

## 2016-10-19 NOTE — Telephone Encounter (Signed)
Spoke to pt's husband regarding the improved response on the PET scan. Follow up as planned.

## 2016-10-22 ENCOUNTER — Other Ambulatory Visit: Payer: Self-pay | Admitting: *Deleted

## 2016-10-22 ENCOUNTER — Inpatient Hospital Stay (HOSPITAL_BASED_OUTPATIENT_CLINIC_OR_DEPARTMENT_OTHER): Payer: Medicare Other | Admitting: Internal Medicine

## 2016-10-22 ENCOUNTER — Inpatient Hospital Stay: Payer: Medicare Other

## 2016-10-22 VITALS — BP 132/72 | HR 91 | Temp 97.6°F | Resp 18

## 2016-10-22 DIAGNOSIS — R911 Solitary pulmonary nodule: Secondary | ICD-10-CM | POA: Diagnosis not present

## 2016-10-22 DIAGNOSIS — Z853 Personal history of malignant neoplasm of breast: Secondary | ICD-10-CM

## 2016-10-22 DIAGNOSIS — Z170 Estrogen receptor positive status [ER+]: Secondary | ICD-10-CM

## 2016-10-22 DIAGNOSIS — Z923 Personal history of irradiation: Secondary | ICD-10-CM

## 2016-10-22 DIAGNOSIS — M810 Age-related osteoporosis without current pathological fracture: Secondary | ICD-10-CM | POA: Diagnosis not present

## 2016-10-22 DIAGNOSIS — R0602 Shortness of breath: Secondary | ICD-10-CM

## 2016-10-22 DIAGNOSIS — Z79811 Long term (current) use of aromatase inhibitors: Secondary | ICD-10-CM | POA: Diagnosis not present

## 2016-10-22 DIAGNOSIS — I10 Essential (primary) hypertension: Secondary | ICD-10-CM

## 2016-10-22 DIAGNOSIS — F17210 Nicotine dependence, cigarettes, uncomplicated: Secondary | ICD-10-CM

## 2016-10-22 DIAGNOSIS — Z79899 Other long term (current) drug therapy: Secondary | ICD-10-CM | POA: Diagnosis not present

## 2016-10-22 DIAGNOSIS — Z9221 Personal history of antineoplastic chemotherapy: Secondary | ICD-10-CM

## 2016-10-22 DIAGNOSIS — D649 Anemia, unspecified: Secondary | ICD-10-CM

## 2016-10-22 DIAGNOSIS — M1990 Unspecified osteoarthritis, unspecified site: Secondary | ICD-10-CM

## 2016-10-22 DIAGNOSIS — D469 Myelodysplastic syndrome, unspecified: Secondary | ICD-10-CM

## 2016-10-22 DIAGNOSIS — D693 Immune thrombocytopenic purpura: Secondary | ICD-10-CM

## 2016-10-22 DIAGNOSIS — C50811 Malignant neoplasm of overlapping sites of right female breast: Secondary | ICD-10-CM

## 2016-10-22 DIAGNOSIS — E785 Hyperlipidemia, unspecified: Secondary | ICD-10-CM

## 2016-10-22 DIAGNOSIS — D61818 Other pancytopenia: Secondary | ICD-10-CM

## 2016-10-22 DIAGNOSIS — D62 Acute posthemorrhagic anemia: Secondary | ICD-10-CM

## 2016-10-22 LAB — CBC WITH DIFFERENTIAL/PLATELET
BASOS PCT: 0 %
Basophils Absolute: 0 10*3/uL (ref 0–0.1)
EOS ABS: 0 10*3/uL (ref 0–0.7)
EOS PCT: 2 %
HCT: 22.2 % — ABNORMAL LOW (ref 35.0–47.0)
Hemoglobin: 7.9 g/dL — ABNORMAL LOW (ref 12.0–16.0)
LYMPHS ABS: 0.9 10*3/uL — AB (ref 1.0–3.6)
Lymphocytes Relative: 31 %
MCH: 39.4 pg — AB (ref 26.0–34.0)
MCHC: 35.6 g/dL (ref 32.0–36.0)
MCV: 110.4 fL — ABNORMAL HIGH (ref 80.0–100.0)
MONO ABS: 0.2 10*3/uL (ref 0.2–0.9)
MONOS PCT: 7 %
Neutro Abs: 1.7 10*3/uL (ref 1.4–6.5)
Neutrophils Relative %: 60 %
Platelets: 25 10*3/uL — CL (ref 150–400)
RBC: 2.01 MIL/uL — ABNORMAL LOW (ref 3.80–5.20)
RDW: 20.8 % — AB (ref 11.5–14.5)
WBC: 2.9 10*3/uL — ABNORMAL LOW (ref 3.6–11.0)

## 2016-10-22 LAB — SAMPLE TO BLOOD BANK

## 2016-10-22 LAB — PNH PROFILE (-HIGH SENSITIVITY)

## 2016-10-22 LAB — PREPARE RBC (CROSSMATCH)

## 2016-10-22 MED ORDER — CYANOCOBALAMIN 1000 MCG/ML IJ SOLN
1000.0000 ug | Freq: Once | INTRAMUSCULAR | Status: AC
  Administered 2016-10-22: 1000 ug via INTRAMUSCULAR
  Filled 2016-10-22: qty 1

## 2016-10-22 MED ORDER — ROMIPLOSTIM INJECTION 500 MCG
690.0000 ug | Freq: Once | SUBCUTANEOUS | Status: AC
  Administered 2016-10-22: 690 ug via SUBCUTANEOUS
  Filled 2016-10-22: qty 1

## 2016-10-22 NOTE — Assessment & Plan Note (Addendum)
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 On Femara.  AUG 10th - improved response- improvement of the hilar/mediastinal adenopathy. Continue Femara for now.  # Severe thrombocytopenia//anemia- highly suspicious for MDS [s/p opinion at [facility]] however extensive workup negative for any obvious cause of her severe thrombocytopenia.  Patient's platelets 25 today; on N-plate q weekly recently increased the dose to 10 mics per kilogram weekly.  # Severe anemia Anemia- Hb 7.9 plan transfusion this week. Borderline low B12-recommend B12 weekly injections. [facility] flow cytometry copper zinc level normal. Discussed with Dr. Florene Glen at [facility].   # weekly cbc/endplate/B12 -  follow-up with me approximately 4 weeks.   # I reviewed the blood work- with the patient in detail; also reviewed the imaging independently [as summarized above]; and with the patient in detail.

## 2016-10-22 NOTE — Progress Notes (Signed)
Patient here for f/u for ITP. Call report on plt count of 25 - est. Count from [facility], lab tech in cancer center 1002 am. Read back process performed. Dr. Rogue Bussing infomred 1006am

## 2016-10-22 NOTE — Progress Notes (Signed)
[place] OFFICE PROGRESS NOTE  Patient Care Team: Coral Spikes, DO as PCP - General (Family Medicine) [name], DPM as Consulting Physician (Podiatry) [name], MD as Consulting Physician (Internal Medicine) Robert Bellow, MD (General Surgery)   SUMMARY OF ONCOLOGIC HISTORY:  Oncology History   # April 2018- Left chest wall Bx- IMC; ER-PR-POS; her 2 Neu NEG; PET- mild hilar/distal adenopathy; ~1 cm right lung nodule/ several sub-centimeter.   # [facility]; Abemacliclib [on HOLD sec to cytopenia]  # April 2017-isolated Thrombocytopenia- platelets- 113; worsening PANCYTOPENIA- April 2018- BMBx- no evidence of malignancy; MDS/Acute leuk; Karyotype/MDS- FISH panel-NEG [d/w Dr.Smir];   # June 2018- REPEAT BMBx/[facility] [June 2018-Dr.Foster; Aug 2018- Dr.Powell at [facility]]; No Diagnosis.   # 2006- RIGHT BREAST CA [pT1cN0M0; STAGE I; ER/PRPos; Her 2 Neu-NEG] s/p FEC x 6 [NSABP B-36]; Femara [Dec 2007-2012]  # Osteoporosis s/p Reclast; BMD- 2015-wnl- ca+vit D         Carcinoma of overlapping sites of right breast in female, estrogen receptor positive ([place])     INTERVAL HISTORY:  A very pleasant 71 year old female patient With newly diagnosed recurrent breast cancer- ER/PR positive HER-2/neu negative currently on Femara; And pancytopenia/ with more severe thrombocytopenia- currently on N-plate is here for follow-up. Patient is here to review the results of the restaging PET scan.  Patient interim underwent a another opinion at [facility]- 4 anemia and thrombocytopenia. She has just returned from a trip to the beach.  She has intermittent shortness of breath. No cough. Denies any blood in stools black stools or bleeding gums or nose. Admits to intermittent easy bruising.  Denies any petechial rash. Denies any blood in stools or black stools. She continues to take Femara. No weight loss. Appetite is good.    REVIEW OF SYSTEMS:  A  complete 10 point review of system is done which is negative except mentioned above/history of present illness.   PAST MEDICAL HISTORY :  Past Medical History:  Diagnosis Date  . Breast cancer ([place])    right, lumpectomy, radiation, chemo  . Breast cancer ([place])    Right, 2007  . 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  . Headache   . History of chemotherapy   . History of radiation therapy   . Hyperlipidemia   . Hypertension   . Osteoarthritis    right hip  . Osteoporosis   . Squamous cell carcinoma    leg, Followed by Dr. Nicole Kindred    PAST SURGICAL HISTORY :   Past Surgical History:  Procedure Laterality Date  . ABDOMINAL HYSTERECTOMY    . BREAST BIOPSY Left 2002   left breast, calcifications  . BREAST BIOPSY Left 06/20/2016   INVASIVE DUCTAL CARCINOMA.  Marland Kitchen BREAST EXCISIONAL BIOPSY Right 2007   positive  . bunion repair    . ESOPHAGOGASTRODUODENOSCOPY (EGD) WITH PROPOFOL N/A 08/05/2016   Procedure: ESOPHAGOGASTRODUODENOSCOPY (EGD) WITH PROPOFOL;  Surgeon: San Jetty, MD;  Location: [facility] ENDOSCOPY;  Service: General;  Laterality: N/A;  . left breast biopsy    . NASAL SINUS SURGERY    . right hip replacement    . SHOULDER SURGERY    . SQUAMOUS CELL CARCINOMA EXCISION     right leg, Dr. Nicole Kindred  . TOTAL HIP ARTHROPLASTY     right    FAMILY HISTORY :   Family History  Problem Relation Age of Onset  . Heart disease Father 36  . Heart disease Mother   .  Hyperlipidemia Mother   . Heart disease Brother   . Diabetes Maternal Grandmother   . Breast cancer Cousin     SOCIAL HISTORY:   Social History  Substance Use Topics  . Smoking status: Current Some Day Smoker    Packs/day: 0.10    Years: 30.00    Types: Cigarettes  . Smokeless tobacco: Never Used  . Alcohol use 0.0 oz/week     Comment: socially    ALLERGIES:  is allergic to codeine; fish-derived products; morphine sulfate; fish allergy; morphine sulfate; and oxycodone.  MEDICATIONS:   Current Outpatient Prescriptions  Medication Sig Dispense Refill  . ALPRAZolam (XANAX) 0.5 MG tablet TAKE 1 TABLET BY MOUTH EVERY 8 HOURS AS NEEDED FOR ANXIETY OR SLEEP 30 tablet 2  . Cholecalciferol (VITAMIN D3) 1000 units CAPS Take 1 capsule by mouth daily.     Marland Kitchen Fexofenadine HCl (ALLEGRA PO) Take 1 capsule by mouth.    . letrozole (FEMARA) 2.5 MG tablet Take 1 tablet (2.5 mg total) by mouth daily. Once a day. 90 tablet 3  . metoprolol succinate (TOPROL-XL) 25 MG 24 hr tablet Take 1 tablet (25 mg total) by mouth daily. 90 tablet 3  . pravastatin (PRAVACHOL) 40 MG tablet Take 1 tablet (40 mg total) by mouth daily. 90 tablet 3  . Probiotic Product (PROBIOTIC & ACIDOPHILUS EX ST PO) Take 1 tablet by mouth daily.     . ranitidine (ZANTAC) 150 MG capsule Take 150 mg by mouth 2 (two) times daily.    . Abemaciclib 150 MG TABS Take 150 mg by mouth every 12 (twelve) hours. (Patient not taking: Reported on 08/21/2016) 60 tablet 6  . ondansetron (ZOFRAN) 8 MG tablet Take 1 tablet (8 mg total) by mouth every 8 (eight) hours as needed for nausea or vomiting. (Patient not taking: Reported on 10/22/2016) 20 tablet 4  . triamcinolone ointment (KENALOG) 0.5 % Apply 1 application topically 2 (two) times daily. (Patient not taking: Reported on 08/21/2016) 30 g 0   No current facility-administered medications for this visit.     PHYSICAL EXAMINATION: ECOG PERFORMANCE STATUS: 0 - Asymptomatic  BP 132/72   Pulse 91   Temp 97.6 F (36.4 C) (Tympanic)   Resp 18   There were no vitals filed for this visit.  GENERAL: Well-nourished well-developed; Alert, no distress and comfortable.  Accompanied by Her husband. EYES: WNL.  OROPHARYNX: no thrush or ulceration; good dentition. No bleeding noted. NECK: supple, no masses felt LYMPH:  no palpable lymphadenopathy in the cervical, axillary or inguinal regions LUNGS: clear to auscultation and  No wheeze or crackles HEART/CVS: regular rate & rhythm and no murmurs;  No lower extremity edema ABDOMEN:abdomen soft, non-tender and normal bowel sounds Musculoskeletal:no cyanosis of digits and no clubbing  PSYCH: alert & oriented x 3 with fluent speech NEURO: no focal motor/sensory deficits SKIN:  No rash noted. Subcutaneous metastatic nodules felt. Improving.   LABORATORY DATA:  I have reviewed the data as listed    Component Value Date/Time   NA 130 (L) 08/04/2016 0445   K 4.0 08/04/2016 0445   CL 98 (L) 08/04/2016 0445   CO2 24 08/04/2016 0445   GLUCOSE 116 (H) 08/04/2016 0445   BUN 9 08/04/2016 0445   CREATININE 0.69 08/04/2016 0445   CREATININE 0.67 07/09/2014 1321   CREATININE 0.70 04/30/2014 1455   CALCIUM 8.6 (L) 08/04/2016 0445   PROT 8.0 08/03/2016 1453   PROT 7.3 07/09/2014 1321   ALBUMIN 3.7 08/03/2016 1453  ALBUMIN 4.3 07/09/2014 1321   AST 25 08/03/2016 1453   AST 19 07/09/2014 1321   ALT 16 08/03/2016 1453   ALT 18 07/09/2014 1321   ALKPHOS 64 08/03/2016 1453   ALKPHOS 66 07/09/2014 1321   BILITOT 0.7 08/03/2016 1453   BILITOT 0.6 07/09/2014 1321   GFRNONAA >60 08/04/2016 0445   GFRNONAA >60 07/09/2014 1321   GFRAA >60 08/04/2016 0445   GFRAA >60 07/09/2014 1321    No results found for: SPEP, UPEP  Lab Results  Component Value Date   WBC 2.9 (L) 10/22/2016   NEUTROABS 1.7 10/22/2016   HGB 7.9 (L) 10/22/2016   HCT 22.2 (L) 10/22/2016   MCV 110.4 (H) 10/22/2016   PLT 25 (LL) 10/22/2016      Chemistry      Component Value Date/Time   NA 130 (L) 08/04/2016 0445   K 4.0 08/04/2016 0445   CL 98 (L) 08/04/2016 0445   CO2 24 08/04/2016 0445   BUN 9 08/04/2016 0445   CREATININE 0.69 08/04/2016 0445   CREATININE 0.67 07/09/2014 1321   CREATININE 0.70 04/30/2014 1455      Component Value Date/Time   CALCIUM 8.6 (L) 08/04/2016 0445   ALKPHOS 64 08/03/2016 1453   ALKPHOS 66 07/09/2014 1321   AST 25 08/03/2016 1453   AST 19 07/09/2014 1321   ALT 16 08/03/2016 1453   ALT 18 07/09/2014 1321   BILITOT 0.7  08/03/2016 1453   BILITOT 0.6 07/09/2014 1321        ASSESSMENT & PLAN:   Carcinoma of overlapping sites of right breast in female, estrogen receptor positive ([place]) 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 On Femara.  AUG 10th - improved response- improvement of the hilar/mediastinal adenopathy. Continue Femara for now.  # Severe thrombocytopenia//anemia- highly suspicious for MDS [s/p opinion at [facility]] however extensive workup negative for any obvious cause of her severe thrombocytopenia.  Patient's platelets 25 today; on N-plate q weekly recently increased the dose to 10 mics per kilogram weekly.  # Severe anemia Anemia- Hb 7.9 plan transfusion this week. Borderline low B12-recommend B12 weekly injections. [facility] flow cytometry copper zinc level normal. Discussed with Dr. Florene Glen at [facility].   # weekly cbc/endplate/B12 -  follow-up with me approximately 4 weeks.   # I reviewed the blood work- with the patient in detail; also reviewed the imaging independently [as summarized above]; and with the patient in detail.      [name], MD 10/22/2016 1:14 PM

## 2016-10-24 ENCOUNTER — Inpatient Hospital Stay: Payer: Medicare Other

## 2016-10-24 DIAGNOSIS — Z79899 Other long term (current) drug therapy: Secondary | ICD-10-CM | POA: Diagnosis not present

## 2016-10-24 DIAGNOSIS — R911 Solitary pulmonary nodule: Secondary | ICD-10-CM | POA: Diagnosis not present

## 2016-10-24 DIAGNOSIS — Z170 Estrogen receptor positive status [ER+]: Secondary | ICD-10-CM | POA: Diagnosis not present

## 2016-10-24 DIAGNOSIS — D61818 Other pancytopenia: Secondary | ICD-10-CM | POA: Diagnosis not present

## 2016-10-24 DIAGNOSIS — D649 Anemia, unspecified: Secondary | ICD-10-CM

## 2016-10-24 DIAGNOSIS — Z79811 Long term (current) use of aromatase inhibitors: Secondary | ICD-10-CM | POA: Diagnosis not present

## 2016-10-24 DIAGNOSIS — C50811 Malignant neoplasm of overlapping sites of right female breast: Secondary | ICD-10-CM | POA: Diagnosis not present

## 2016-10-24 MED ORDER — METHYLPREDNISOLONE SODIUM SUCC 125 MG IJ SOLR
40.0000 mg | Freq: Once | INTRAMUSCULAR | Status: AC
  Administered 2016-10-24: 40 mg via INTRAVENOUS
  Filled 2016-10-24: qty 2

## 2016-10-24 MED ORDER — SODIUM CHLORIDE 0.9 % IV SOLN
250.0000 mL | Freq: Once | INTRAVENOUS | Status: AC
  Administered 2016-10-24: 250 mL via INTRAVENOUS
  Filled 2016-10-24: qty 250

## 2016-10-24 MED ORDER — ACETAMINOPHEN 325 MG PO TABS
650.0000 mg | ORAL_TABLET | Freq: Once | ORAL | Status: AC
  Administered 2016-10-24: 650 mg via ORAL
  Filled 2016-10-24: qty 2

## 2016-10-24 MED ORDER — DIPHENHYDRAMINE HCL 25 MG PO CAPS
25.0000 mg | ORAL_CAPSULE | Freq: Once | ORAL | Status: AC
  Administered 2016-10-24: 25 mg via ORAL
  Filled 2016-10-24: qty 1

## 2016-10-25 LAB — TYPE AND SCREEN
ABO/RH(D): O POS
Antibody Screen: POSITIVE
Donor AG Type: NEGATIVE
Unit division: 0
Unit tag comment: NEGATIVE

## 2016-10-25 LAB — BPAM RBC
BLOOD PRODUCT EXPIRATION DATE: 201809032359
ISSUE DATE / TIME: 201808151013
Unit Type and Rh: 5100

## 2016-10-29 ENCOUNTER — Inpatient Hospital Stay: Payer: Medicare Other

## 2016-10-29 DIAGNOSIS — D61818 Other pancytopenia: Secondary | ICD-10-CM | POA: Diagnosis not present

## 2016-10-29 DIAGNOSIS — Z170 Estrogen receptor positive status [ER+]: Secondary | ICD-10-CM

## 2016-10-29 DIAGNOSIS — Z79811 Long term (current) use of aromatase inhibitors: Secondary | ICD-10-CM | POA: Diagnosis not present

## 2016-10-29 DIAGNOSIS — R911 Solitary pulmonary nodule: Secondary | ICD-10-CM | POA: Diagnosis not present

## 2016-10-29 DIAGNOSIS — Z79899 Other long term (current) drug therapy: Secondary | ICD-10-CM | POA: Diagnosis not present

## 2016-10-29 DIAGNOSIS — C50811 Malignant neoplasm of overlapping sites of right female breast: Secondary | ICD-10-CM | POA: Diagnosis not present

## 2016-10-29 DIAGNOSIS — D469 Myelodysplastic syndrome, unspecified: Secondary | ICD-10-CM

## 2016-10-29 DIAGNOSIS — D693 Immune thrombocytopenic purpura: Secondary | ICD-10-CM

## 2016-10-29 LAB — CBC WITH DIFFERENTIAL/PLATELET
Basophils Absolute: 0 10*3/uL (ref 0–0.1)
Basophils Relative: 1 %
EOS PCT: 2 %
Eosinophils Absolute: 0 10*3/uL (ref 0–0.7)
HEMATOCRIT: 28.6 % — AB (ref 35.0–47.0)
HEMOGLOBIN: 10.2 g/dL — AB (ref 12.0–16.0)
LYMPHS ABS: 1.3 10*3/uL (ref 1.0–3.6)
LYMPHS PCT: 41 %
MCH: 37.5 pg — ABNORMAL HIGH (ref 26.0–34.0)
MCHC: 35.6 g/dL (ref 32.0–36.0)
MCV: 105.1 fL — AB (ref 80.0–100.0)
Monocytes Absolute: 0.2 10*3/uL (ref 0.2–0.9)
Monocytes Relative: 7 %
Neutro Abs: 1.5 10*3/uL (ref 1.4–6.5)
Neutrophils Relative %: 49 %
Platelets: 26 10*3/uL — CL (ref 150–400)
RBC: 2.72 MIL/uL — AB (ref 3.80–5.20)
RDW: 22.7 % — AB (ref 11.5–14.5)
WBC: 3.1 10*3/uL — AB (ref 3.6–11.0)

## 2016-10-29 LAB — SAMPLE TO BLOOD BANK

## 2016-10-29 MED ORDER — ROMIPLOSTIM INJECTION 500 MCG
690.0000 ug | Freq: Once | SUBCUTANEOUS | Status: AC
  Administered 2016-10-29: 690 ug via SUBCUTANEOUS
  Filled 2016-10-29: qty 0.88

## 2016-10-29 MED ORDER — CYANOCOBALAMIN 1000 MCG/ML IJ SOLN
1000.0000 ug | Freq: Once | INTRAMUSCULAR | Status: AC
  Administered 2016-10-29: 1000 ug via INTRAMUSCULAR
  Filled 2016-10-29: qty 1

## 2016-11-05 ENCOUNTER — Inpatient Hospital Stay: Payer: Medicare Other

## 2016-11-05 DIAGNOSIS — C50811 Malignant neoplasm of overlapping sites of right female breast: Secondary | ICD-10-CM | POA: Diagnosis not present

## 2016-11-05 DIAGNOSIS — D693 Immune thrombocytopenic purpura: Secondary | ICD-10-CM

## 2016-11-05 DIAGNOSIS — Z79811 Long term (current) use of aromatase inhibitors: Secondary | ICD-10-CM | POA: Diagnosis not present

## 2016-11-05 DIAGNOSIS — D61818 Other pancytopenia: Secondary | ICD-10-CM | POA: Diagnosis not present

## 2016-11-05 DIAGNOSIS — D62 Acute posthemorrhagic anemia: Secondary | ICD-10-CM

## 2016-11-05 DIAGNOSIS — Z170 Estrogen receptor positive status [ER+]: Secondary | ICD-10-CM | POA: Diagnosis not present

## 2016-11-05 DIAGNOSIS — Z79899 Other long term (current) drug therapy: Secondary | ICD-10-CM | POA: Diagnosis not present

## 2016-11-05 DIAGNOSIS — R911 Solitary pulmonary nodule: Secondary | ICD-10-CM | POA: Diagnosis not present

## 2016-11-05 LAB — SAMPLE TO BLOOD BANK

## 2016-11-05 LAB — CBC WITH DIFFERENTIAL/PLATELET
BASOS ABS: 0 10*3/uL (ref 0–0.1)
Basophils Relative: 0 %
Eosinophils Absolute: 0 10*3/uL (ref 0–0.7)
Eosinophils Relative: 1 %
HEMATOCRIT: 29.1 % — AB (ref 35.0–47.0)
Hemoglobin: 10.4 g/dL — ABNORMAL LOW (ref 12.0–16.0)
LYMPHS PCT: 34 %
Lymphs Abs: 1.3 10*3/uL (ref 1.0–3.6)
MCH: 37.8 pg — ABNORMAL HIGH (ref 26.0–34.0)
MCHC: 35.7 g/dL (ref 32.0–36.0)
MCV: 105.8 fL — AB (ref 80.0–100.0)
Monocytes Absolute: 0.3 10*3/uL (ref 0.2–0.9)
Monocytes Relative: 7 %
NEUTROS ABS: 2.1 10*3/uL (ref 1.4–6.5)
NEUTROS PCT: 58 %
PLATELETS: 30 10*3/uL — AB (ref 150–440)
RBC: 2.75 MIL/uL — AB (ref 3.80–5.20)
RDW: 23 % — ABNORMAL HIGH (ref 11.5–14.5)
WBC: 3.7 10*3/uL (ref 3.6–11.0)

## 2016-11-05 MED ORDER — CYANOCOBALAMIN 1000 MCG/ML IJ SOLN
1000.0000 ug | Freq: Once | INTRAMUSCULAR | Status: AC
  Administered 2016-11-05: 1000 ug via INTRAMUSCULAR
  Filled 2016-11-05: qty 1

## 2016-11-05 MED ORDER — ROMIPLOSTIM INJECTION 500 MCG
690.0000 ug | Freq: Once | SUBCUTANEOUS | Status: AC
  Administered 2016-11-05: 690 ug via SUBCUTANEOUS
  Filled 2016-11-05: qty 0.38

## 2016-11-13 ENCOUNTER — Inpatient Hospital Stay: Payer: Medicare Other

## 2016-11-13 ENCOUNTER — Inpatient Hospital Stay: Payer: Medicare Other | Attending: Internal Medicine

## 2016-11-13 DIAGNOSIS — Z923 Personal history of irradiation: Secondary | ICD-10-CM | POA: Insufficient documentation

## 2016-11-13 DIAGNOSIS — D61818 Other pancytopenia: Secondary | ICD-10-CM | POA: Insufficient documentation

## 2016-11-13 DIAGNOSIS — M810 Age-related osteoporosis without current pathological fracture: Secondary | ICD-10-CM | POA: Diagnosis not present

## 2016-11-13 DIAGNOSIS — C50811 Malignant neoplasm of overlapping sites of right female breast: Secondary | ICD-10-CM | POA: Insufficient documentation

## 2016-11-13 DIAGNOSIS — Z170 Estrogen receptor positive status [ER+]: Secondary | ICD-10-CM

## 2016-11-13 DIAGNOSIS — Z79899 Other long term (current) drug therapy: Secondary | ICD-10-CM | POA: Diagnosis not present

## 2016-11-13 DIAGNOSIS — D693 Immune thrombocytopenic purpura: Secondary | ICD-10-CM

## 2016-11-13 DIAGNOSIS — F17210 Nicotine dependence, cigarettes, uncomplicated: Secondary | ICD-10-CM | POA: Insufficient documentation

## 2016-11-13 DIAGNOSIS — Z79811 Long term (current) use of aromatase inhibitors: Secondary | ICD-10-CM | POA: Insufficient documentation

## 2016-11-13 DIAGNOSIS — I10 Essential (primary) hypertension: Secondary | ICD-10-CM | POA: Insufficient documentation

## 2016-11-13 DIAGNOSIS — E785 Hyperlipidemia, unspecified: Secondary | ICD-10-CM | POA: Diagnosis not present

## 2016-11-13 DIAGNOSIS — M1990 Unspecified osteoarthritis, unspecified site: Secondary | ICD-10-CM | POA: Diagnosis not present

## 2016-11-13 DIAGNOSIS — D469 Myelodysplastic syndrome, unspecified: Secondary | ICD-10-CM

## 2016-11-13 DIAGNOSIS — Z803 Family history of malignant neoplasm of breast: Secondary | ICD-10-CM | POA: Diagnosis not present

## 2016-11-13 DIAGNOSIS — Z9221 Personal history of antineoplastic chemotherapy: Secondary | ICD-10-CM | POA: Insufficient documentation

## 2016-11-13 LAB — CBC WITH DIFFERENTIAL/PLATELET
BASOS ABS: 0 10*3/uL (ref 0–0.1)
Basophils Relative: 0 %
Eosinophils Absolute: 0 10*3/uL (ref 0–0.7)
Eosinophils Relative: 2 %
HEMATOCRIT: 28.5 % — AB (ref 35.0–47.0)
HEMOGLOBIN: 10.1 g/dL — AB (ref 12.0–16.0)
LYMPHS ABS: 1.2 10*3/uL (ref 1.0–3.6)
LYMPHS PCT: 44 %
MCH: 37.8 pg — ABNORMAL HIGH (ref 26.0–34.0)
MCHC: 35.5 g/dL (ref 32.0–36.0)
MCV: 106.5 fL — ABNORMAL HIGH (ref 80.0–100.0)
Monocytes Absolute: 0.2 10*3/uL (ref 0.2–0.9)
Monocytes Relative: 6 %
NEUTROS ABS: 1.4 10*3/uL (ref 1.4–6.5)
NEUTROS PCT: 48 %
Platelets: 26 10*3/uL — CL (ref 150–400)
RBC: 2.68 MIL/uL — AB (ref 3.80–5.20)
RDW: 22.5 % — AB (ref 11.5–14.5)
WBC: 2.8 10*3/uL — AB (ref 3.6–11.0)

## 2016-11-13 MED ORDER — ROMIPLOSTIM INJECTION 500 MCG
690.0000 ug | Freq: Once | SUBCUTANEOUS | Status: AC
  Administered 2016-11-13: 690 ug via SUBCUTANEOUS
  Filled 2016-11-13: qty 1

## 2016-11-13 MED ORDER — CYANOCOBALAMIN 1000 MCG/ML IJ SOLN
1000.0000 ug | Freq: Once | INTRAMUSCULAR | Status: AC
  Administered 2016-11-13: 1000 ug via INTRAMUSCULAR
  Filled 2016-11-13: qty 1

## 2016-11-15 LAB — SAMPLE TO BLOOD BANK

## 2016-11-19 ENCOUNTER — Inpatient Hospital Stay: Payer: Medicare Other

## 2016-11-19 ENCOUNTER — Inpatient Hospital Stay (HOSPITAL_BASED_OUTPATIENT_CLINIC_OR_DEPARTMENT_OTHER): Payer: Medicare Other | Admitting: Internal Medicine

## 2016-11-19 VITALS — BP 157/74 | HR 95 | Temp 97.8°F | Resp 20 | Ht 61.0 in | Wt 151.0 lb

## 2016-11-19 DIAGNOSIS — D61818 Other pancytopenia: Secondary | ICD-10-CM | POA: Diagnosis not present

## 2016-11-19 DIAGNOSIS — M1990 Unspecified osteoarthritis, unspecified site: Secondary | ICD-10-CM

## 2016-11-19 DIAGNOSIS — I10 Essential (primary) hypertension: Secondary | ICD-10-CM | POA: Diagnosis not present

## 2016-11-19 DIAGNOSIS — Z803 Family history of malignant neoplasm of breast: Secondary | ICD-10-CM

## 2016-11-19 DIAGNOSIS — Z170 Estrogen receptor positive status [ER+]: Principal | ICD-10-CM

## 2016-11-19 DIAGNOSIS — Z923 Personal history of irradiation: Secondary | ICD-10-CM | POA: Diagnosis not present

## 2016-11-19 DIAGNOSIS — E785 Hyperlipidemia, unspecified: Secondary | ICD-10-CM

## 2016-11-19 DIAGNOSIS — Z79899 Other long term (current) drug therapy: Secondary | ICD-10-CM | POA: Diagnosis not present

## 2016-11-19 DIAGNOSIS — F17210 Nicotine dependence, cigarettes, uncomplicated: Secondary | ICD-10-CM | POA: Diagnosis not present

## 2016-11-19 DIAGNOSIS — D469 Myelodysplastic syndrome, unspecified: Secondary | ICD-10-CM

## 2016-11-19 DIAGNOSIS — C50811 Malignant neoplasm of overlapping sites of right female breast: Secondary | ICD-10-CM

## 2016-11-19 DIAGNOSIS — Z9221 Personal history of antineoplastic chemotherapy: Secondary | ICD-10-CM | POA: Diagnosis not present

## 2016-11-19 DIAGNOSIS — M810 Age-related osteoporosis without current pathological fracture: Secondary | ICD-10-CM

## 2016-11-19 DIAGNOSIS — D693 Immune thrombocytopenic purpura: Secondary | ICD-10-CM

## 2016-11-19 DIAGNOSIS — Z79811 Long term (current) use of aromatase inhibitors: Secondary | ICD-10-CM | POA: Diagnosis not present

## 2016-11-19 LAB — SAMPLE TO BLOOD BANK

## 2016-11-19 LAB — CBC WITH DIFFERENTIAL/PLATELET
Basophils Absolute: 0 10*3/uL (ref 0–0.1)
Basophils Relative: 1 %
EOS ABS: 0 10*3/uL (ref 0–0.7)
EOS PCT: 1 %
HCT: 26.9 % — ABNORMAL LOW (ref 35.0–47.0)
Hemoglobin: 9.6 g/dL — ABNORMAL LOW (ref 12.0–16.0)
LYMPHS ABS: 0.9 10*3/uL — AB (ref 1.0–3.6)
Lymphocytes Relative: 25 %
MCH: 38.1 pg — AB (ref 26.0–34.0)
MCHC: 35.6 g/dL (ref 32.0–36.0)
MCV: 106.9 fL — AB (ref 80.0–100.0)
Monocytes Absolute: 0.2 10*3/uL (ref 0.2–0.9)
Monocytes Relative: 6 %
Neutro Abs: 2.6 10*3/uL (ref 1.4–6.5)
Neutrophils Relative %: 68 %
PLATELETS: 28 10*3/uL — AB (ref 150–400)
RBC: 2.51 MIL/uL — AB (ref 3.80–5.20)
RDW: 22.6 % — AB (ref 11.5–14.5)
WBC: 3.8 10*3/uL (ref 3.6–11.0)

## 2016-11-19 MED ORDER — CYANOCOBALAMIN 1000 MCG/ML IJ SOLN
1000.0000 ug | Freq: Once | INTRAMUSCULAR | Status: AC
  Administered 2016-11-19: 1000 ug via INTRAMUSCULAR
  Filled 2016-11-19: qty 1

## 2016-11-19 MED ORDER — ROMIPLOSTIM INJECTION 500 MCG
690.0000 ug | Freq: Once | SUBCUTANEOUS | Status: AC
Start: 2016-11-19 — End: 2016-11-19
  Administered 2016-11-19: 690 ug via SUBCUTANEOUS
  Filled 2016-11-19: qty 1

## 2016-11-19 NOTE — Assessment & Plan Note (Addendum)
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 On Femara.  AUG 10th - improved response- improvement of the hilar/mediastinal adenopathy. Continue Femara for now. Okay to hold off mammogram at this time.  # Severe thrombocytopenia//anemia- highly suspicious for MDS [s/p opinion at [facility]] however extensive workup negative for any obvious cause of her severe thrombocytopenia.  Patient's platelets 28 today; continue N-plate q weekly [11 mics per kilogram weekly].   # Severe anemia Anemia- Hb 9.2 s/p transfusion approximately 4 weeks ago. Currently on B12 weekly injections. Switch over to every 4 weeks B12 injection. Discussed re: opinion at other places; including [facility]. Wants to hold off for now.   # weekly cbc/endplate/B12 q 4 week -  follow-up with me approximately 4 weeks.

## 2016-11-19 NOTE — Progress Notes (Signed)
[place] OFFICE PROGRESS NOTE  Patient Care Team: Coral Spikes, DO as PCP - General (Family Medicine) [name], DPM as Consulting Physician (Podiatry) [name], MD as Consulting Physician (Internal Medicine) Robert Bellow, MD (General Surgery)   SUMMARY OF ONCOLOGIC HISTORY:  Oncology History   # April 2018- Left chest wall Bx- IMC; ER-PR-POS; her 2 Neu NEG; PET- mild hilar/distal adenopathy; ~1 cm right lung nodule/ several sub-centimeter.   # May 2018[facility]; Abemacliclib [on HOLD sec to cytopenia]  # April 2017-isolated Thrombocytopenia- platelets- 113; worsening PANCYTOPENIA- April 2018- BMBx- no evidence of malignancy; MDS/Acute leuk; Karyotype/MDS- FISH panel-NEG [d/w Dr.Smir];   # June 2018- REPEAT BMBx/[facility] [June 2018-Dr.Foster; Aug 2018- Dr.Powell at [facility]]; No Diagnosis.   # 2006- RIGHT BREAST CA [pT1cN0M0; STAGE I; ER/PRPos; Her 2 Neu-NEG] s/p FEC x 6 [NSABP B-36]; Femara [Dec 2007-2012]  # Osteoporosis s/p Reclast; BMD- 2015-wnl- ca+vit D         Carcinoma of overlapping sites of right breast in female, estrogen receptor positive ([place])     INTERVAL HISTORY:  A very pleasant 71 year old female patient With newly diagnosed recurrent breast cancer- ER/PR positive HER-2/neu negative currently on Femara; And pancytopenia/ with more severe thrombocytopenia- currently on N-plate is here for follow-up.  Patient denies any cough or shortness of breath. Her last PRBC transfusion was approximately 4 weeks ago. She denies any blood in stools black stools or bleeding gums or nose. Admits to intermittent easy bruising.  Denies any petechial rash. Denies any blood in stools or black stools. She continues to take Femara. No weight loss. Appetite is good. Her energy levels are adequate.   REVIEW OF SYSTEMS:  A complete 10 point review of system is done which is negative except mentioned above/history of present illness.   PAST  MEDICAL HISTORY :  Past Medical History:  Diagnosis Date  . Breast cancer ([place])    right, lumpectomy, radiation, chemo  . Breast cancer ([place])    Right, 2007  . Breast cancer ([place]) 06/20/2016   INVASIVE DUCTAL CARCINOMA.  . Headache   . History of chemotherapy   . History of radiation therapy   . Hyperlipidemia   . Hypertension   . Osteoarthritis    right hip  . Osteoporosis   . Squamous cell carcinoma    leg, Followed by Dr. Nicole Kindred    PAST SURGICAL HISTORY :   Past Surgical History:  Procedure Laterality Date  . ABDOMINAL HYSTERECTOMY    . BREAST BIOPSY Left 2002   left breast, calcifications  . BREAST BIOPSY Left 06/20/2016   INVASIVE DUCTAL CARCINOMA.  Marland Kitchen BREAST EXCISIONAL BIOPSY Right 2007   positive  . bunion repair    . ESOPHAGOGASTRODUODENOSCOPY (EGD) WITH PROPOFOL N/A 08/05/2016   Procedure: ESOPHAGOGASTRODUODENOSCOPY (EGD) WITH PROPOFOL;  Surgeon: San Jetty, MD;  Location: [facility] ENDOSCOPY;  Service: General;  Laterality: N/A;  . left breast biopsy    . NASAL SINUS SURGERY    . right hip replacement    . SHOULDER SURGERY    . SQUAMOUS CELL CARCINOMA EXCISION     right leg, Dr. Nicole Kindred  . TOTAL HIP ARTHROPLASTY     right    FAMILY HISTORY :   Family History  Problem Relation Age of Onset  . Heart disease Father 103  . Heart disease Mother   . Hyperlipidemia Mother   . Heart disease Brother   . Diabetes Maternal Grandmother   . Breast cancer Cousin  SOCIAL HISTORY:   Social History  Substance Use Topics  . Smoking status: Current Some Day Smoker    Packs/day: 0.10    Years: 30.00    Types: Cigarettes  . Smokeless tobacco: Never Used  . Alcohol use 0.0 oz/week     Comment: socially    ALLERGIES:  is allergic to codeine; fish-derived products; morphine sulfate; fish allergy; morphine sulfate; and oxycodone.  MEDICATIONS:  Current Outpatient Prescriptions  Medication Sig Dispense Refill  . Cholecalciferol (VITAMIN D3) 1000 units CAPS Take  1 capsule by mouth daily.     Marland Kitchen Fexofenadine HCl (ALLEGRA PO) Take 1 capsule by mouth.    . letrozole (FEMARA) 2.5 MG tablet Take 1 tablet (2.5 mg total) by mouth daily. Once a day. 90 tablet 3  . Melatonin 10 MG TABS Take 1 tablet by mouth at bedtime.    . metoprolol succinate (TOPROL-XL) 25 MG 24 hr tablet Take 1 tablet (25 mg total) by mouth daily. 90 tablet 3  . pravastatin (PRAVACHOL) 40 MG tablet Take 1 tablet (40 mg total) by mouth daily. 90 tablet 3  . Probiotic Product (PROBIOTIC & ACIDOPHILUS EX ST PO) Take 1 tablet by mouth daily.     Marland Kitchen ALPRAZolam (XANAX) 0.5 MG tablet TAKE 1 TABLET BY MOUTH EVERY 8 HOURS AS NEEDED FOR ANXIETY OR SLEEP (Patient not taking: Reported on 11/19/2016) 30 tablet 2   No current facility-administered medications for this visit.     PHYSICAL EXAMINATION: ECOG PERFORMANCE STATUS: 0 - Asymptomatic  BP (!) 157/74 (BP Location: Left Arm, Patient Position: Sitting)   Pulse 95   Temp 97.8 F (36.6 C) (Tympanic)   Resp 20   Ht _0  (1.549 m)   Wt 151 lb (68.5 kg)   BMI 28.53 kg/m   Filed Weights   11/19/16 1133  Weight: 151 lb (68.5 kg)    GENERAL: Well-nourished well-developed; Alert, no distress and comfortable.  Accompanied by Her husband. EYES: WNL.  OROPHARYNX: no thrush or ulceration; good dentition. No bleeding noted. NECK: supple, no masses felt LYMPH:  no palpable lymphadenopathy in the cervical, axillary or inguinal regions LUNGS: clear to auscultation and  No wheeze or crackles HEART/CVS: regular rate & rhythm and no murmurs; No lower extremity edema ABDOMEN:abdomen soft, non-tender and normal bowel sounds Musculoskeletal:no cyanosis of digits and no clubbing  PSYCH: alert & oriented x 3 with fluent speech NEURO: no focal motor/sensory deficits SKIN:  No rash noted. Subcutaneous metastatic nodules felt. Improving.   LABORATORY DATA:  I have reviewed the data as listed    Component Value Date/Time   NA 130 (L) 08/04/2016 0445   K  4.0 08/04/2016 0445   CL 98 (L) 08/04/2016 0445   CO2 24 08/04/2016 0445   GLUCOSE 116 (H) 08/04/2016 0445   BUN 9 08/04/2016 0445   CREATININE 0.69 08/04/2016 0445   CREATININE 0.67 07/09/2014 1321   CREATININE 0.70 04/30/2014 1455   CALCIUM 8.6 (L) 08/04/2016 0445   PROT 8.0 08/03/2016 1453   PROT 7.3 07/09/2014 1321   ALBUMIN 3.7 08/03/2016 1453   ALBUMIN 4.3 07/09/2014 1321   AST 25 08/03/2016 1453   AST 19 07/09/2014 1321   ALT 16 08/03/2016 1453   ALT 18 07/09/2014 1321   ALKPHOS 64 08/03/2016 1453   ALKPHOS 66 07/09/2014 1321   BILITOT 0.7 08/03/2016 1453   BILITOT 0.6 07/09/2014 1321   GFRNONAA >60 08/04/2016 0445   GFRNONAA >60 07/09/2014 1321   GFRAA >60 08/04/2016 0445  GFRAA >60 07/09/2014 1321    No results found for: SPEP, UPEP  Lab Results  Component Value Date   WBC 3.8 11/19/2016   NEUTROABS 2.6 11/19/2016   HGB 9.6 (L) 11/19/2016   HCT 26.9 (L) 11/19/2016   MCV 106.9 (H) 11/19/2016   PLT 28 (LL) 11/19/2016      Chemistry      Component Value Date/Time   NA 130 (L) 08/04/2016 0445   K 4.0 08/04/2016 0445   CL 98 (L) 08/04/2016 0445   CO2 24 08/04/2016 0445   BUN 9 08/04/2016 0445   CREATININE 0.69 08/04/2016 0445   CREATININE 0.67 07/09/2014 1321   CREATININE 0.70 04/30/2014 1455      Component Value Date/Time   CALCIUM 8.6 (L) 08/04/2016 0445   ALKPHOS 64 08/03/2016 1453   ALKPHOS 66 07/09/2014 1321   AST 25 08/03/2016 1453   AST 19 07/09/2014 1321   ALT 16 08/03/2016 1453   ALT 18 07/09/2014 1321   BILITOT 0.7 08/03/2016 1453   BILITOT 0.6 07/09/2014 1321        ASSESSMENT & PLAN:   Carcinoma of overlapping sites of right breast in female, estrogen receptor positive ([place]) 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 On Femara.  AUG 10th - improved response- improvement of the hilar/mediastinal adenopathy. Continue Femara for now. Okay to hold off mammogram at this time.  # Severe  thrombocytopenia//anemia- highly suspicious for MDS [s/p opinion at [facility]] however extensive workup negative for any obvious cause of her severe thrombocytopenia.  Patient's platelets 28 today; continue N-plate q weekly [11 mics per kilogram weekly].   # Severe anemia Anemia- Hb 9.2 s/p transfusion approximately 4 weeks ago. Currently on B12 weekly injections. Switch over to every 4 weeks B12 injection. Discussed re: opinion at other places; including [facility]. Wants to hold off for now.   # weekly cbc/endplate/B12 q 4 week -  follow-up with me approximately 4 weeks.      [name], MD 11/19/2016 1:04 PM

## 2016-11-26 ENCOUNTER — Inpatient Hospital Stay: Payer: Medicare Other

## 2016-11-26 DIAGNOSIS — Z170 Estrogen receptor positive status [ER+]: Secondary | ICD-10-CM

## 2016-11-26 DIAGNOSIS — D693 Immune thrombocytopenic purpura: Secondary | ICD-10-CM

## 2016-11-26 DIAGNOSIS — Z79811 Long term (current) use of aromatase inhibitors: Secondary | ICD-10-CM | POA: Diagnosis not present

## 2016-11-26 DIAGNOSIS — C50811 Malignant neoplasm of overlapping sites of right female breast: Secondary | ICD-10-CM | POA: Diagnosis not present

## 2016-11-26 DIAGNOSIS — D61818 Other pancytopenia: Secondary | ICD-10-CM | POA: Diagnosis not present

## 2016-11-26 DIAGNOSIS — M810 Age-related osteoporosis without current pathological fracture: Secondary | ICD-10-CM | POA: Diagnosis not present

## 2016-11-26 DIAGNOSIS — D469 Myelodysplastic syndrome, unspecified: Secondary | ICD-10-CM

## 2016-11-26 DIAGNOSIS — Z79899 Other long term (current) drug therapy: Secondary | ICD-10-CM | POA: Diagnosis not present

## 2016-11-26 LAB — CBC WITH DIFFERENTIAL/PLATELET
Basophils Absolute: 0 10*3/uL (ref 0–0.1)
Basophils Relative: 0 %
Eosinophils Absolute: 0 10*3/uL (ref 0–0.7)
Eosinophils Relative: 1 %
HEMATOCRIT: 26.6 % — AB (ref 35.0–47.0)
HEMOGLOBIN: 9.5 g/dL — AB (ref 12.0–16.0)
LYMPHS ABS: 1.1 10*3/uL (ref 1.0–3.6)
Lymphocytes Relative: 32 %
MCH: 38.3 pg — AB (ref 26.0–34.0)
MCHC: 35.8 g/dL (ref 32.0–36.0)
MCV: 106.9 fL — AB (ref 80.0–100.0)
MONOS PCT: 6 %
Monocytes Absolute: 0.2 10*3/uL (ref 0.2–0.9)
NEUTROS ABS: 2.1 10*3/uL (ref 1.4–6.5)
NEUTROS PCT: 61 %
Platelets: 28 10*3/uL — CL (ref 150–400)
RBC: 2.48 MIL/uL — ABNORMAL LOW (ref 3.80–5.20)
RDW: 21.4 % — ABNORMAL HIGH (ref 11.5–14.5)
WBC: 3.3 10*3/uL — ABNORMAL LOW (ref 3.6–11.0)

## 2016-11-26 LAB — SAMPLE TO BLOOD BANK

## 2016-11-26 MED ORDER — ROMIPLOSTIM INJECTION 500 MCG
690.0000 ug | Freq: Once | SUBCUTANEOUS | Status: AC
  Administered 2016-11-26: 690 ug via SUBCUTANEOUS
  Filled 2016-11-26: qty 1

## 2016-12-03 ENCOUNTER — Inpatient Hospital Stay: Payer: Medicare Other

## 2016-12-03 DIAGNOSIS — Z170 Estrogen receptor positive status [ER+]: Principal | ICD-10-CM

## 2016-12-03 DIAGNOSIS — M810 Age-related osteoporosis without current pathological fracture: Secondary | ICD-10-CM | POA: Diagnosis not present

## 2016-12-03 DIAGNOSIS — C50811 Malignant neoplasm of overlapping sites of right female breast: Secondary | ICD-10-CM

## 2016-12-03 DIAGNOSIS — D693 Immune thrombocytopenic purpura: Secondary | ICD-10-CM

## 2016-12-03 DIAGNOSIS — D61818 Other pancytopenia: Secondary | ICD-10-CM | POA: Diagnosis not present

## 2016-12-03 DIAGNOSIS — Z79899 Other long term (current) drug therapy: Secondary | ICD-10-CM | POA: Diagnosis not present

## 2016-12-03 DIAGNOSIS — Z79811 Long term (current) use of aromatase inhibitors: Secondary | ICD-10-CM | POA: Diagnosis not present

## 2016-12-03 DIAGNOSIS — D469 Myelodysplastic syndrome, unspecified: Secondary | ICD-10-CM

## 2016-12-03 LAB — CBC WITH DIFFERENTIAL/PLATELET
BASOS ABS: 0 10*3/uL (ref 0–0.1)
Basophils Relative: 0 %
Eosinophils Absolute: 0 10*3/uL (ref 0–0.7)
Eosinophils Relative: 1 %
HEMATOCRIT: 24.6 % — AB (ref 35.0–47.0)
Hemoglobin: 8.8 g/dL — ABNORMAL LOW (ref 12.0–16.0)
LYMPHS ABS: 1.2 10*3/uL (ref 1.0–3.6)
LYMPHS PCT: 36 %
MCH: 38.6 pg — ABNORMAL HIGH (ref 26.0–34.0)
MCHC: 35.8 g/dL (ref 32.0–36.0)
MCV: 107.6 fL — AB (ref 80.0–100.0)
Monocytes Absolute: 0.2 10*3/uL (ref 0.2–0.9)
Monocytes Relative: 5 %
NEUTROS ABS: 1.9 10*3/uL (ref 1.4–6.5)
NEUTROS PCT: 58 %
Platelets: 25 10*3/uL — CL (ref 150–440)
RBC: 2.29 MIL/uL — AB (ref 3.80–5.20)
RDW: 21.2 % — ABNORMAL HIGH (ref 11.5–14.5)
WBC: 3.4 10*3/uL — AB (ref 3.6–11.0)

## 2016-12-03 LAB — SAMPLE TO BLOOD BANK

## 2016-12-03 MED ORDER — ROMIPLOSTIM 250 MCG ~~LOC~~ SOLR
690.0000 ug | Freq: Once | SUBCUTANEOUS | Status: AC
  Administered 2016-12-03: 690 ug via SUBCUTANEOUS
  Filled 2016-12-03: qty 0.38

## 2016-12-03 NOTE — Progress Notes (Deleted)
Patient's blood pressure is elevated today and not able to receive the Procrit injection.  Dr. Grayland Ormond would like her to return next week for Procrit only if blood pressure has improved (use labs from 12/03/16).  The blood pressure readings from today were written down for patient to call her PCP when she gets home.

## 2016-12-10 ENCOUNTER — Inpatient Hospital Stay: Payer: Medicare Other

## 2016-12-10 ENCOUNTER — Inpatient Hospital Stay: Payer: Medicare Other | Attending: Internal Medicine

## 2016-12-10 DIAGNOSIS — Z803 Family history of malignant neoplasm of breast: Secondary | ICD-10-CM | POA: Insufficient documentation

## 2016-12-10 DIAGNOSIS — R21 Rash and other nonspecific skin eruption: Secondary | ICD-10-CM | POA: Diagnosis not present

## 2016-12-10 DIAGNOSIS — D469 Myelodysplastic syndrome, unspecified: Secondary | ICD-10-CM

## 2016-12-10 DIAGNOSIS — E875 Hyperkalemia: Secondary | ICD-10-CM | POA: Diagnosis not present

## 2016-12-10 DIAGNOSIS — C50811 Malignant neoplasm of overlapping sites of right female breast: Secondary | ICD-10-CM | POA: Diagnosis not present

## 2016-12-10 DIAGNOSIS — Z23 Encounter for immunization: Secondary | ICD-10-CM | POA: Diagnosis not present

## 2016-12-10 DIAGNOSIS — I10 Essential (primary) hypertension: Secondary | ICD-10-CM | POA: Insufficient documentation

## 2016-12-10 DIAGNOSIS — Z923 Personal history of irradiation: Secondary | ICD-10-CM | POA: Insufficient documentation

## 2016-12-10 DIAGNOSIS — Z170 Estrogen receptor positive status [ER+]: Secondary | ICD-10-CM | POA: Insufficient documentation

## 2016-12-10 DIAGNOSIS — Z79899 Other long term (current) drug therapy: Secondary | ICD-10-CM | POA: Diagnosis not present

## 2016-12-10 DIAGNOSIS — Z853 Personal history of malignant neoplasm of breast: Secondary | ICD-10-CM | POA: Diagnosis not present

## 2016-12-10 DIAGNOSIS — Z79811 Long term (current) use of aromatase inhibitors: Secondary | ICD-10-CM | POA: Insufficient documentation

## 2016-12-10 DIAGNOSIS — M1990 Unspecified osteoarthritis, unspecified site: Secondary | ICD-10-CM | POA: Diagnosis not present

## 2016-12-10 DIAGNOSIS — F17210 Nicotine dependence, cigarettes, uncomplicated: Secondary | ICD-10-CM | POA: Diagnosis not present

## 2016-12-10 DIAGNOSIS — D61818 Other pancytopenia: Secondary | ICD-10-CM | POA: Diagnosis not present

## 2016-12-10 DIAGNOSIS — R0602 Shortness of breath: Secondary | ICD-10-CM | POA: Diagnosis not present

## 2016-12-10 DIAGNOSIS — Z9221 Personal history of antineoplastic chemotherapy: Secondary | ICD-10-CM | POA: Insufficient documentation

## 2016-12-10 DIAGNOSIS — D693 Immune thrombocytopenic purpura: Secondary | ICD-10-CM

## 2016-12-10 LAB — CBC WITH DIFFERENTIAL/PLATELET
Basophils Absolute: 0 10*3/uL (ref 0–0.1)
Basophils Relative: 0 %
EOS PCT: 1 %
Eosinophils Absolute: 0 10*3/uL (ref 0–0.7)
HEMATOCRIT: 24.3 % — AB (ref 35.0–47.0)
HEMOGLOBIN: 8.7 g/dL — AB (ref 12.0–16.0)
LYMPHS ABS: 1 10*3/uL (ref 1.0–3.6)
LYMPHS PCT: 33 %
MCH: 38.8 pg — ABNORMAL HIGH (ref 26.0–34.0)
MCHC: 35.9 g/dL (ref 32.0–36.0)
MCV: 108 fL — AB (ref 80.0–100.0)
Monocytes Absolute: 0.2 10*3/uL (ref 0.2–0.9)
Monocytes Relative: 8 %
NEUTROS ABS: 1.7 10*3/uL (ref 1.4–6.5)
Neutrophils Relative %: 58 %
Platelets: 23 10*3/uL — CL (ref 150–400)
RBC: 2.25 MIL/uL — AB (ref 3.80–5.20)
RDW: 21.3 % — AB (ref 11.5–14.5)
WBC: 3 10*3/uL — AB (ref 3.6–11.0)

## 2016-12-10 LAB — SAMPLE TO BLOOD BANK

## 2016-12-10 MED ORDER — ROMIPLOSTIM INJECTION 500 MCG
690.0000 ug | Freq: Once | SUBCUTANEOUS | Status: AC
  Administered 2016-12-10: 690 ug via SUBCUTANEOUS
  Filled 2016-12-10: qty 1

## 2016-12-11 DIAGNOSIS — D692 Other nonthrombocytopenic purpura: Secondary | ICD-10-CM | POA: Diagnosis not present

## 2016-12-11 DIAGNOSIS — L821 Other seborrheic keratosis: Secondary | ICD-10-CM | POA: Diagnosis not present

## 2016-12-11 DIAGNOSIS — Z85828 Personal history of other malignant neoplasm of skin: Secondary | ICD-10-CM | POA: Diagnosis not present

## 2016-12-11 DIAGNOSIS — Z1283 Encounter for screening for malignant neoplasm of skin: Secondary | ICD-10-CM | POA: Diagnosis not present

## 2016-12-11 DIAGNOSIS — L578 Other skin changes due to chronic exposure to nonionizing radiation: Secondary | ICD-10-CM | POA: Diagnosis not present

## 2016-12-11 DIAGNOSIS — L820 Inflamed seborrheic keratosis: Secondary | ICD-10-CM | POA: Diagnosis not present

## 2016-12-11 DIAGNOSIS — L570 Actinic keratosis: Secondary | ICD-10-CM | POA: Diagnosis not present

## 2016-12-17 ENCOUNTER — Inpatient Hospital Stay: Payer: Medicare Other

## 2016-12-17 ENCOUNTER — Inpatient Hospital Stay (HOSPITAL_BASED_OUTPATIENT_CLINIC_OR_DEPARTMENT_OTHER): Payer: Medicare Other | Admitting: Internal Medicine

## 2016-12-17 ENCOUNTER — Other Ambulatory Visit: Payer: Self-pay | Admitting: *Deleted

## 2016-12-17 DIAGNOSIS — D649 Anemia, unspecified: Secondary | ICD-10-CM

## 2016-12-17 DIAGNOSIS — Z23 Encounter for immunization: Secondary | ICD-10-CM

## 2016-12-17 DIAGNOSIS — D61818 Other pancytopenia: Secondary | ICD-10-CM

## 2016-12-17 DIAGNOSIS — Z79899 Other long term (current) drug therapy: Secondary | ICD-10-CM

## 2016-12-17 DIAGNOSIS — C50811 Malignant neoplasm of overlapping sites of right female breast: Secondary | ICD-10-CM

## 2016-12-17 DIAGNOSIS — Z170 Estrogen receptor positive status [ER+]: Secondary | ICD-10-CM

## 2016-12-17 DIAGNOSIS — R0602 Shortness of breath: Secondary | ICD-10-CM | POA: Diagnosis not present

## 2016-12-17 DIAGNOSIS — Z79811 Long term (current) use of aromatase inhibitors: Secondary | ICD-10-CM | POA: Diagnosis not present

## 2016-12-17 DIAGNOSIS — F17210 Nicotine dependence, cigarettes, uncomplicated: Secondary | ICD-10-CM

## 2016-12-17 DIAGNOSIS — R21 Rash and other nonspecific skin eruption: Secondary | ICD-10-CM | POA: Diagnosis not present

## 2016-12-17 DIAGNOSIS — M1990 Unspecified osteoarthritis, unspecified site: Secondary | ICD-10-CM

## 2016-12-17 DIAGNOSIS — E875 Hyperkalemia: Secondary | ICD-10-CM

## 2016-12-17 DIAGNOSIS — D469 Myelodysplastic syndrome, unspecified: Secondary | ICD-10-CM

## 2016-12-17 DIAGNOSIS — Z853 Personal history of malignant neoplasm of breast: Secondary | ICD-10-CM

## 2016-12-17 DIAGNOSIS — I10 Essential (primary) hypertension: Secondary | ICD-10-CM | POA: Diagnosis not present

## 2016-12-17 DIAGNOSIS — R58 Hemorrhage, not elsewhere classified: Secondary | ICD-10-CM

## 2016-12-17 DIAGNOSIS — D693 Immune thrombocytopenic purpura: Secondary | ICD-10-CM

## 2016-12-17 LAB — CBC WITH DIFFERENTIAL/PLATELET
BASOS ABS: 0 10*3/uL (ref 0–0.1)
Basophils Relative: 0 %
EOS ABS: 0 10*3/uL (ref 0–0.7)
EOS PCT: 2 %
HCT: 22.9 % — ABNORMAL LOW (ref 35.0–47.0)
Hemoglobin: 7.9 g/dL — ABNORMAL LOW (ref 12.0–16.0)
LYMPHS PCT: 40 %
Lymphs Abs: 1.2 10*3/uL (ref 1.0–3.6)
MCH: 38.5 pg — ABNORMAL HIGH (ref 26.0–34.0)
MCHC: 34.4 g/dL (ref 32.0–36.0)
MCV: 111.8 fL — ABNORMAL HIGH (ref 80.0–100.0)
Monocytes Absolute: 0.2 10*3/uL (ref 0.2–0.9)
Monocytes Relative: 7 %
NEUTROS PCT: 51 %
Neutro Abs: 1.5 10*3/uL (ref 1.4–6.5)
PLATELETS: 21 10*3/uL — AB (ref 150–400)
RBC: 2.04 MIL/uL — AB (ref 3.80–5.20)
RDW: 21 % — AB (ref 11.5–14.5)
Smear Review: DECREASED
WBC: 2.9 10*3/uL — AB (ref 3.6–11.0)

## 2016-12-17 LAB — SAMPLE TO BLOOD BANK

## 2016-12-17 LAB — PREPARE RBC (CROSSMATCH)

## 2016-12-17 MED ORDER — ERGOCALCIFEROL 1.25 MG (50000 UT) PO CAPS: 50000.0000 [IU] | ORAL_CAPSULE | ORAL | 1 refills | Status: DC

## 2016-12-17 MED ORDER — ROMIPLOSTIM INJECTION 500 MCG
690.0000 ug | Freq: Once | SUBCUTANEOUS | Status: AC
  Administered 2016-12-17: 690 ug via SUBCUTANEOUS
  Filled 2016-12-17: qty 1

## 2016-12-17 MED ORDER — CYANOCOBALAMIN 1000 MCG/ML IJ SOLN
1000.0000 ug | Freq: Once | INTRAMUSCULAR | Status: AC
Start: 2016-12-17 — End: 2016-12-17
  Administered 2016-12-17: 1000 ug via INTRAMUSCULAR
  Filled 2016-12-17: qty 1

## 2016-12-17 MED ORDER — INFLUENZA VAC SPLIT QUAD 0.5 ML IM SUSY
0.5000 mL | PREFILLED_SYRINGE | Freq: Once | INTRAMUSCULAR | Status: AC
  Administered 2016-12-17: 0.5 mL via INTRAMUSCULAR
  Filled 2016-12-17: qty 0.5

## 2016-12-17 NOTE — Progress Notes (Signed)
Call report Lonnie in cancer center lab- 1105 am. plt count critical value of 21. Read back process performed with lab tech.

## 2016-12-17 NOTE — Progress Notes (Signed)
[place] OFFICE PROGRESS NOTE  Patient Care Team: Coral Spikes, DO as PCP - General (Family Medicine) [name], DPM as Consulting Physician (Podiatry) [name], MD as Consulting Physician (Internal Medicine) Robert Bellow, MD (General Surgery)   SUMMARY OF ONCOLOGIC HISTORY:  Oncology History   # April 2018- Left chest wall Bx- IMC; ER-PR-POS; her 2 Neu NEG; PET- mild hilar/distal adenopathy; ~1 cm right lung nodule/ several sub-centimeter.   # [facility]; Abemacliclib [on HOLD sec to cytopenia]  # April 2017-isolated Thrombocytopenia- platelets- 113; worsening PANCYTOPENIA- April 2018- BMBx- no evidence of malignancy; MDS/Acute leuk; Karyotype/MDS- FISH panel-NEG [d/w Dr.Smir];   # June 2018- REPEAT BMBx/[facility] [June 2018-Dr.Foster; Aug 2018- Dr.Powell at [facility]]; No Diagnosis.   # 2006- RIGHT BREAST CA [pT1cN0M0; STAGE I; ER/PRPos; Her 2 Neu-NEG] s/p FEC x 6 [NSABP B-36]; Femara [Dec 2007-2012]  # Osteoporosis s/p Reclast; BMD- 2015-wnl- ca+vit D   Dr.Stewart [derm ? Squamous cell s/p freeze-oct 2018]        Carcinoma of overlapping sites of right breast in female, estrogen receptor positive ([place])     INTERVAL HISTORY:  A very pleasant 71 year old female patient With newly diagnosed recurrent breast cancer- ER/PR positive HER-2/neu negative currently on Femara; And pancytopenia/ with more severe thrombocytopenia- currently on N-plate is here for follow-up.  Patient complains of worsening shortness of breath with exertion. She denies any significant cough. She also noted to have a petechial rash on lower extremities. She had an episode of epistaxis last week. Resolved.  She denies any blood in stools black stools or bleeding gums.  Admits to intermittent easy bruising. She admits to petechial rash bilateral lower extremities. She continues to take Femara. No weight loss. Appetite is good. She feels tired.   REVIEW OF  SYSTEMS:  A complete 10 point review of system is done which is negative except mentioned above/history of present illness.   PAST MEDICAL HISTORY :  Past Medical History:  Diagnosis Date  . Breast cancer ([place])    right, lumpectomy, radiation, chemo  . Breast cancer ([place])    Right, 2007  . 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  . Headache   . History of chemotherapy   . History of radiation therapy   . Hyperlipidemia   . Hypertension   . Osteoarthritis    right hip  . Osteoporosis   . Squamous cell carcinoma    leg, Followed by Dr. Nicole Kindred    PAST SURGICAL HISTORY :   Past Surgical History:  Procedure Laterality Date  . ABDOMINAL HYSTERECTOMY    . BREAST BIOPSY Left 2002   left breast, calcifications  . BREAST BIOPSY Left 06/20/2016   INVASIVE DUCTAL CARCINOMA.  Marland Kitchen BREAST EXCISIONAL BIOPSY Right 2007   positive  . bunion repair    . ESOPHAGOGASTRODUODENOSCOPY (EGD) WITH PROPOFOL N/A 08/05/2016   Procedure: ESOPHAGOGASTRODUODENOSCOPY (EGD) WITH PROPOFOL;  Surgeon: San Jetty, MD;  Location: [facility] ENDOSCOPY;  Service: General;  Laterality: N/A;  . left breast biopsy    . NASAL SINUS SURGERY    . right hip replacement    . SHOULDER SURGERY    . SQUAMOUS CELL CARCINOMA EXCISION     right leg, Dr. Nicole Kindred  . TOTAL HIP ARTHROPLASTY     right    FAMILY HISTORY :   Family History  Problem Relation Age of Onset  . Heart disease Father 15  . Heart disease Mother   . Hyperlipidemia Mother   .  Heart disease Brother   . Diabetes Maternal Grandmother   . Breast cancer Cousin     SOCIAL HISTORY:   Social History  Substance Use Topics  . Smoking status: Current Some Day Smoker    Packs/day: 0.10    Years: 30.00    Types: Cigarettes  . Smokeless tobacco: Never Used  . Alcohol use 0.0 oz/week     Comment: socially    ALLERGIES:  is allergic to codeine; fish-derived products; morphine sulfate; fish allergy; morphine sulfate; and  oxycodone.  MEDICATIONS:  Current Outpatient Prescriptions  Medication Sig Dispense Refill  . ALPRAZolam (XANAX) 0.5 MG tablet TAKE 1 TABLET BY MOUTH EVERY 8 HOURS AS NEEDED FOR ANXIETY OR SLEEP 30 tablet 2  . Cholecalciferol (VITAMIN D3) 1000 units CAPS Take 1 capsule by mouth daily.     Marland Kitchen Fexofenadine HCl (ALLEGRA PO) Take 1 capsule by mouth.    . letrozole (FEMARA) 2.5 MG tablet Take 1 tablet (2.5 mg total) by mouth daily. Once a day. 90 tablet 3  . metoprolol succinate (TOPROL-XL) 25 MG 24 hr tablet Take 1 tablet (25 mg total) by mouth daily. 90 tablet 3  . pravastatin (PRAVACHOL) 40 MG tablet Take 1 tablet (40 mg total) by mouth daily. 90 tablet 3  . Probiotic Product (PROBIOTIC & ACIDOPHILUS EX ST PO) Take 1 tablet by mouth daily.     . ergocalciferol (VITAMIN D2) 50000 units capsule Take 1 capsule (50,000 Units total) by mouth once a week. 12 capsule 1  . Melatonin 10 MG TABS Take 1 tablet by mouth at bedtime.     No current facility-administered medications for this visit.     PHYSICAL EXAMINATION: ECOG PERFORMANCE STATUS: 0 - Asymptomatic  There were no vitals taken for this visit.  There were no vitals filed for this visit.  GENERAL: Well-nourished well-developed; Alert, no distress and comfortable.  Accompanied by Her husband. EYES: WNL.  OROPHARYNX: no thrush or ulceration; good dentition. No bleeding noted. NECK: supple, no masses felt LYMPH:  no palpable lymphadenopathy in the cervical, axillary or inguinal regions LUNGS: clear to auscultation and  No wheeze or crackles HEART/CVS: regular rate & rhythm and no murmurs; No lower extremity edema ABDOMEN:abdomen soft, non-tender and normal bowel sounds Musculoskeletal:no cyanosis of digits and no clubbing  PSYCH: alert & oriented x 3 with fluent speech NEURO: no focal motor/sensory deficits SKIN:  Faint bilateral lower extremity petechial rash noted.  Subcutaneous metastatic nodules felt. Improving.   LABORATORY DATA:   I have reviewed the data as listed    Component Value Date/Time   NA 130 (L) 08/04/2016 0445   K 4.0 08/04/2016 0445   CL 98 (L) 08/04/2016 0445   CO2 24 08/04/2016 0445   GLUCOSE 116 (H) 08/04/2016 0445   BUN 9 08/04/2016 0445   CREATININE 0.69 08/04/2016 0445   CREATININE 0.67 07/09/2014 1321   CREATININE 0.70 04/30/2014 1455   CALCIUM 8.6 (L) 08/04/2016 0445   PROT 8.0 08/03/2016 1453   PROT 7.3 07/09/2014 1321   ALBUMIN 3.7 08/03/2016 1453   ALBUMIN 4.3 07/09/2014 1321   AST 25 08/03/2016 1453   AST 19 07/09/2014 1321   ALT 16 08/03/2016 1453   ALT 18 07/09/2014 1321   ALKPHOS 64 08/03/2016 1453   ALKPHOS 66 07/09/2014 1321   BILITOT 0.7 08/03/2016 1453   BILITOT 0.6 07/09/2014 1321   GFRNONAA >60 08/04/2016 0445   GFRNONAA >60 07/09/2014 1321   GFRAA >60 08/04/2016 0445   GFRAA >60  07/09/2014 1321    No results found for: SPEP, UPEP  Lab Results  Component Value Date   WBC 2.9 (L) 12/17/2016   NEUTROABS 1.5 12/17/2016   HGB 7.9 (L) 12/17/2016   HCT 22.9 (L) 12/17/2016   MCV 111.8 (H) 12/17/2016   PLT 21 (LL) 12/17/2016      Chemistry      Component Value Date/Time   NA 130 (L) 08/04/2016 0445   K 4.0 08/04/2016 0445   CL 98 (L) 08/04/2016 0445   CO2 24 08/04/2016 0445   BUN 9 08/04/2016 0445   CREATININE 0.69 08/04/2016 0445   CREATININE 0.67 07/09/2014 1321   CREATININE 0.70 04/30/2014 1455      Component Value Date/Time   CALCIUM 8.6 (L) 08/04/2016 0445   ALKPHOS 64 08/03/2016 1453   ALKPHOS 66 07/09/2014 1321   AST 25 08/03/2016 1453   AST 19 07/09/2014 1321   ALT 16 08/03/2016 1453   ALT 18 07/09/2014 1321   BILITOT 0.7 08/03/2016 1453   BILITOT 0.6 07/09/2014 1321        ASSESSMENT & PLAN:   Carcinoma of overlapping sites of right breast in female, estrogen receptor positive ([place]) 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 On Femara.  AUG 10th - improved response- improvement of the  hilar/mediastinal adenopathy. Tolerating Femara with mild to moderate difficulties [arthralgias-see discussion below] Continue Femara for now.   # Severe thrombocytopenia//anemia- highly suspicious for MDS [s/p III opinion at [facility]] however extensive workup negative for any obvious cause of her severe thrombocytopenia.  Patient's platelets 21 today; continue N-plate q weekly [81 mics per kilogram weekly]. Last transfusion [ June 28th]. Proceed with platelet transfusion today given the petechial rash.  # Severe anemia Anemia- Hb 7.9 s/p transfusion [aug 15th]  Discussed re: opinion at other places; including [facility]. Patient finally agrees to be evaluated in [facility] [place]. Appointment line numbers given to the patient. Discussed the likely need for a bone marrow biopsy [patient previously had 2 bone marrow biopsies without any clear diagnosis]. However would defer bone marrow biopsy with [place] for now. Proceed with platelet transfusion today given the tiredness.  # Arthralgais- secondary to AI. Recommend high-dose VitD3 weekly times. / glucoasome/ chrondrotin.   # Flu shot- proceed with flu shot today.  # weekly cbc/endplate/B12 q 4 week -  follow-up with me approximately 4 weeks.     [name], MD 12/17/2016 1:26 PM

## 2016-12-17 NOTE — Progress Notes (Signed)
Patient is here today for a follow up. Patient states no new concerns today.  

## 2016-12-17 NOTE — Assessment & Plan Note (Addendum)
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 On Femara.  AUG 10th - improved response- improvement of the hilar/mediastinal adenopathy. Tolerating Femara with mild to moderate difficulties [arthralgias-see discussion below] Continue Femara for now.   # Severe thrombocytopenia//anemia- highly suspicious for MDS [s/p III opinion at [facility]] however extensive workup negative for any obvious cause of her severe thrombocytopenia.  Patient's platelets 21 today; continue N-plate q weekly [76 mics per kilogram weekly]. Last transfusion [ June 28th]. Proceed with platelet transfusion today given the petechial rash.  # Severe anemia Anemia- Hb 7.9 s/p transfusion [aug 15th]  Discussed re: opinion at other places; including [facility]/[place]. Patient finally agrees to be evaluated in [facility] [place]. Appointment line numbers given to the patient. Discussed the likely need for a bone marrow biopsy [patient previously had 2 bone marrow biopsies without any clear diagnosis]. However would defer bone marrow biopsy with [place] for now. Proceed with platelet transfusion today given the tiredness.  # Arthralgais- secondary to AI. Recommend high-dose VitD3 weekly times. / glucoasome/ chrondrotin.   # Flu shot- proceed with flu shot today.  # weekly cbc/endplate/B12 q 4 week -  follow-up with me approximately 4 weeks.

## 2016-12-18 ENCOUNTER — Inpatient Hospital Stay: Payer: Medicare Other

## 2016-12-18 DIAGNOSIS — D61818 Other pancytopenia: Secondary | ICD-10-CM | POA: Diagnosis not present

## 2016-12-18 DIAGNOSIS — D649 Anemia, unspecified: Secondary | ICD-10-CM

## 2016-12-18 DIAGNOSIS — Z170 Estrogen receptor positive status [ER+]: Secondary | ICD-10-CM | POA: Diagnosis not present

## 2016-12-18 DIAGNOSIS — R58 Hemorrhage, not elsewhere classified: Secondary | ICD-10-CM

## 2016-12-18 DIAGNOSIS — Z23 Encounter for immunization: Secondary | ICD-10-CM | POA: Diagnosis not present

## 2016-12-18 DIAGNOSIS — Z79899 Other long term (current) drug therapy: Secondary | ICD-10-CM | POA: Diagnosis not present

## 2016-12-18 DIAGNOSIS — Z79811 Long term (current) use of aromatase inhibitors: Secondary | ICD-10-CM | POA: Diagnosis not present

## 2016-12-18 DIAGNOSIS — C50811 Malignant neoplasm of overlapping sites of right female breast: Secondary | ICD-10-CM | POA: Diagnosis not present

## 2016-12-18 MED ORDER — ACETAMINOPHEN 325 MG PO TABS
650.0000 mg | ORAL_TABLET | Freq: Once | ORAL | Status: AC
  Administered 2016-12-18: 650 mg via ORAL
  Filled 2016-12-18: qty 2

## 2016-12-18 MED ORDER — METHYLPREDNISOLONE SODIUM SUCC 125 MG IJ SOLR
40.0000 mg | Freq: Once | INTRAMUSCULAR | Status: AC
  Administered 2016-12-18: 40 mg via INTRAVENOUS
  Filled 2016-12-18: qty 2

## 2016-12-18 MED ORDER — DIPHENHYDRAMINE HCL 25 MG PO CAPS
25.0000 mg | ORAL_CAPSULE | Freq: Once | ORAL | Status: AC
  Administered 2016-12-18: 25 mg via ORAL
  Filled 2016-12-18: qty 1

## 2016-12-18 MED ORDER — SODIUM CHLORIDE 0.9 % IV SOLN
250.0000 mL | Freq: Once | INTRAVENOUS | Status: AC
  Administered 2016-12-18: 250 mL via INTRAVENOUS
  Filled 2016-12-18: qty 250

## 2016-12-19 LAB — PREPARE PLATELET PHERESIS: UNIT DIVISION: 0

## 2016-12-19 LAB — BPAM PLATELET PHERESIS
Blood Product Expiration Date: 201810102359
ISSUE DATE / TIME: 201810090907
UNIT TYPE AND RH: 6200

## 2016-12-24 ENCOUNTER — Inpatient Hospital Stay: Payer: Medicare Other

## 2016-12-24 DIAGNOSIS — C50811 Malignant neoplasm of overlapping sites of right female breast: Secondary | ICD-10-CM

## 2016-12-24 DIAGNOSIS — Z170 Estrogen receptor positive status [ER+]: Secondary | ICD-10-CM | POA: Diagnosis not present

## 2016-12-24 DIAGNOSIS — D693 Immune thrombocytopenic purpura: Secondary | ICD-10-CM

## 2016-12-24 DIAGNOSIS — D61818 Other pancytopenia: Secondary | ICD-10-CM | POA: Diagnosis not present

## 2016-12-24 DIAGNOSIS — Z23 Encounter for immunization: Secondary | ICD-10-CM | POA: Diagnosis not present

## 2016-12-24 DIAGNOSIS — D469 Myelodysplastic syndrome, unspecified: Secondary | ICD-10-CM

## 2016-12-24 DIAGNOSIS — Z79899 Other long term (current) drug therapy: Secondary | ICD-10-CM | POA: Diagnosis not present

## 2016-12-24 DIAGNOSIS — Z79811 Long term (current) use of aromatase inhibitors: Secondary | ICD-10-CM | POA: Diagnosis not present

## 2016-12-24 LAB — CBC WITH DIFFERENTIAL/PLATELET
Basophils Absolute: 0 10*3/uL (ref 0–0.1)
Basophils Relative: 0 %
EOS ABS: 0 10*3/uL (ref 0–0.7)
Eosinophils Relative: 1 %
HEMATOCRIT: 29.3 % — AB (ref 35.0–47.0)
HEMOGLOBIN: 10.1 g/dL — AB (ref 12.0–16.0)
LYMPHS ABS: 1.3 10*3/uL (ref 1.0–3.6)
LYMPHS PCT: 38 %
MCH: 36.7 pg — AB (ref 26.0–34.0)
MCHC: 34.4 g/dL (ref 32.0–36.0)
MCV: 106.9 fL — AB (ref 80.0–100.0)
Monocytes Absolute: 0.3 10*3/uL (ref 0.2–0.9)
Monocytes Relative: 8 %
NEUTROS ABS: 1.7 10*3/uL (ref 1.4–6.5)
NEUTROS PCT: 53 %
Platelets: 21 10*3/uL — CL (ref 150–400)
RBC: 2.74 MIL/uL — AB (ref 3.80–5.20)
RDW: 21.9 % — ABNORMAL HIGH (ref 11.5–14.5)
WBC: 3.3 10*3/uL — AB (ref 3.6–11.0)

## 2016-12-24 LAB — SAMPLE TO BLOOD BANK

## 2016-12-24 MED ORDER — ROMIPLOSTIM INJECTION 500 MCG
690.0000 ug | Freq: Once | SUBCUTANEOUS | Status: AC
  Administered 2016-12-24: 690 ug via SUBCUTANEOUS
  Filled 2016-12-24: qty 1

## 2016-12-27 ENCOUNTER — Encounter: Payer: Self-pay | Admitting: Family Medicine

## 2016-12-27 ENCOUNTER — Ambulatory Visit (INDEPENDENT_AMBULATORY_CARE_PROVIDER_SITE_OTHER): Payer: Medicare Other | Admitting: Family Medicine

## 2016-12-27 DIAGNOSIS — Z170 Estrogen receptor positive status [ER+]: Secondary | ICD-10-CM | POA: Diagnosis not present

## 2016-12-27 DIAGNOSIS — E785 Hyperlipidemia, unspecified: Secondary | ICD-10-CM

## 2016-12-27 DIAGNOSIS — D696 Thrombocytopenia, unspecified: Secondary | ICD-10-CM | POA: Diagnosis not present

## 2016-12-27 DIAGNOSIS — C50811 Malignant neoplasm of overlapping sites of right female breast: Secondary | ICD-10-CM

## 2016-12-27 DIAGNOSIS — I10 Essential (primary) hypertension: Secondary | ICD-10-CM | POA: Diagnosis not present

## 2016-12-27 DIAGNOSIS — K5900 Constipation, unspecified: Secondary | ICD-10-CM | POA: Insufficient documentation

## 2016-12-27 NOTE — Assessment & Plan Note (Signed)
Following with hematology. She's getting a fourth opinion in [place].

## 2016-12-27 NOTE — Assessment & Plan Note (Signed)
Well controlled. Continue current medications  

## 2016-12-27 NOTE — Assessment & Plan Note (Signed)
Continue to follow with oncology.

## 2016-12-27 NOTE — Assessment & Plan Note (Signed)
Continue pravastatin 

## 2016-12-27 NOTE — Progress Notes (Signed)
  Tommi Rumps, MD Phone: [phone]  Shelly Arias is a 71 y.o. female who presents today for follow-up.  Hypertension: Taking metoprolol. No chest pain, shortness breath, or edema. Not checking at home.  Hyperlipidemia: Taking pravastatin. Right upper quadrant pain or myalgias.  She notes some constipation recently. Has had to strain some. Has bowel movement daily. Had a little bit of blood last week though this resolved. She took a laxative though her son advised her not to. She is going to start taking some MiraLAX for this.  She has a history of breast cancer followed by oncology. Notes metastatic to lungs and liver. Currently on letrozole. Also has thrombocytopenia and anemia and leukopenia. She is going to a cancer center in [place] for a fourth opinion. No obvious cause has been found on prior workups.  PMH: Smoker   ROS see history of present illness  Objective  Physical Exam Vitals:   12/27/16 1107  BP: 118/64  Pulse: (!) 104  Resp: 18  Temp: 97.9 F (36.6 C)  SpO2: 97%    BP Readings from Last 3 Encounters:  12/27/16 118/64  12/18/16 (!) 141/83  11/19/16 (!) 157/74   Wt Readings from Last 3 Encounters:  12/27/16 148 lb 2 oz (67.2 kg)  11/19/16 151 lb (68.5 kg)  10/19/16 151 lb (68.5 kg)    Physical Exam  Constitutional: No distress.  Cardiovascular: Normal rate, regular rhythm and normal heart sounds.   Pulmonary/Chest: Effort normal and breath sounds normal.  Abdominal: Soft. Bowel sounds are normal. She exhibits no distension. There is no tenderness. There is no rebound and no guarding.  Musculoskeletal: She exhibits no edema.  Neurological: She is alert. Gait normal.  Skin: Skin is warm and dry. She is not diaphoretic.     Assessment/Plan: Please see individual problem list.  Essential hypertension Well-controlled. Continue current medications.  Hyperlipidemia Continue pravastatin.  Carcinoma of overlapping sites of right  breast in female, estrogen receptor positive ([place]) Continue to follow with oncology.  Thrombocytopenia ([place]) Following with hematology. She's getting a fourth opinion in [place].  Constipation Patient with symptoms of constipation. She had a small amount of bleeding one week ago. Discussed doing a rectal exam though she deferred. Discussed checking stool cards though she deferred. She'll treat her constipation and if she continues to have issues she will let us know.   Tommi Rumps, MD [place]

## 2016-12-27 NOTE — Patient Instructions (Signed)
Nice to see you. Please take MiraLAX for constipation. Please watch the bleeding and if it returns please let us know. If you have excessive bleeding please be evaluated.

## 2016-12-27 NOTE — Assessment & Plan Note (Signed)
Patient with symptoms of constipation. She had a small amount of bleeding one week ago. Discussed doing a rectal exam though she deferred. Discussed checking stool cards though she deferred. She'll treat her constipation and if she continues to have issues she will let us know.

## 2016-12-31 ENCOUNTER — Inpatient Hospital Stay: Payer: Medicare Other

## 2016-12-31 DIAGNOSIS — C50811 Malignant neoplasm of overlapping sites of right female breast: Secondary | ICD-10-CM

## 2016-12-31 DIAGNOSIS — Z170 Estrogen receptor positive status [ER+]: Secondary | ICD-10-CM

## 2016-12-31 DIAGNOSIS — D61818 Other pancytopenia: Secondary | ICD-10-CM | POA: Diagnosis not present

## 2016-12-31 DIAGNOSIS — Z79899 Other long term (current) drug therapy: Secondary | ICD-10-CM | POA: Diagnosis not present

## 2016-12-31 DIAGNOSIS — Z79811 Long term (current) use of aromatase inhibitors: Secondary | ICD-10-CM | POA: Diagnosis not present

## 2016-12-31 DIAGNOSIS — D693 Immune thrombocytopenic purpura: Secondary | ICD-10-CM

## 2016-12-31 DIAGNOSIS — D469 Myelodysplastic syndrome, unspecified: Secondary | ICD-10-CM

## 2016-12-31 DIAGNOSIS — Z23 Encounter for immunization: Secondary | ICD-10-CM | POA: Diagnosis not present

## 2016-12-31 LAB — CBC WITH DIFFERENTIAL/PLATELET
BASOS ABS: 0 10*3/uL (ref 0–0.1)
Basophils Relative: 0 %
Eosinophils Absolute: 0 10*3/uL (ref 0–0.7)
Eosinophils Relative: 1 %
HCT: 27.2 % — ABNORMAL LOW (ref 35.0–47.0)
HEMOGLOBIN: 9.5 g/dL — AB (ref 12.0–16.0)
LYMPHS ABS: 1.2 10*3/uL (ref 1.0–3.6)
LYMPHS PCT: 38 %
MCH: 37.2 pg — AB (ref 26.0–34.0)
MCHC: 35 g/dL (ref 32.0–36.0)
MCV: 106.4 fL — AB (ref 80.0–100.0)
Monocytes Absolute: 0.2 10*3/uL (ref 0.2–0.9)
Monocytes Relative: 7 %
NEUTROS ABS: 1.7 10*3/uL (ref 1.4–6.5)
NEUTROS PCT: 54 %
PLATELETS: 22 10*3/uL — AB (ref 150–400)
RBC: 2.55 MIL/uL — AB (ref 3.80–5.20)
RDW: 22 % — ABNORMAL HIGH (ref 11.5–14.5)
WBC: 3.2 10*3/uL — AB (ref 3.6–11.0)

## 2016-12-31 LAB — SAMPLE TO BLOOD BANK

## 2016-12-31 MED ORDER — ROMIPLOSTIM INJECTION 500 MCG
690.0000 ug | Freq: Once | SUBCUTANEOUS | Status: AC
  Administered 2016-12-31: 690 ug via SUBCUTANEOUS
  Filled 2016-12-31: qty 1

## 2017-01-03 ENCOUNTER — Telehealth: Payer: Self-pay | Admitting: *Deleted

## 2017-01-03 ENCOUNTER — Telehealth: Payer: Self-pay | Admitting: Internal Medicine

## 2017-01-03 NOTE — Telephone Encounter (Signed)
[facility] has not received records from Dr B regarding patient. Pleases send records ASAP

## 2017-01-03 NOTE — Telephone Encounter (Addendum)
Patient called again and states that she spoke with [facility] and told them we are fed ex ing 400 pages, they are not sure when they will receive them as they have a special address for fed ex records, they request the last 3 labs be sent by e-mail to them : [email or web address]. Scanned results into my e-mail and sent to [place]

## 2017-01-03 NOTE — Telephone Encounter (Signed)
Patient informed of them being mailed and the qntysent

## 2017-01-03 NOTE — Telephone Encounter (Signed)
I spoke with Shelly Arias in Medical Records. She states that the records were sent on 12/27/16. She states that the medical records that were sent over contained over 400 page, so she mailed these records by [facility], rather than faxing. She states this could be why there is a delay.  She states that if the patient has anymore questions, they can call her directly at [phone].

## 2017-01-07 ENCOUNTER — Telehealth: Payer: Self-pay | Admitting: Internal Medicine

## 2017-01-07 ENCOUNTER — Inpatient Hospital Stay: Payer: Medicare Other

## 2017-01-07 ENCOUNTER — Other Ambulatory Visit: Payer: Self-pay | Admitting: Internal Medicine

## 2017-01-07 DIAGNOSIS — D469 Myelodysplastic syndrome, unspecified: Secondary | ICD-10-CM

## 2017-01-07 DIAGNOSIS — Z79899 Other long term (current) drug therapy: Secondary | ICD-10-CM | POA: Diagnosis not present

## 2017-01-07 DIAGNOSIS — Z170 Estrogen receptor positive status [ER+]: Principal | ICD-10-CM

## 2017-01-07 DIAGNOSIS — D61818 Other pancytopenia: Secondary | ICD-10-CM | POA: Diagnosis not present

## 2017-01-07 DIAGNOSIS — C50811 Malignant neoplasm of overlapping sites of right female breast: Secondary | ICD-10-CM

## 2017-01-07 DIAGNOSIS — D693 Immune thrombocytopenic purpura: Secondary | ICD-10-CM

## 2017-01-07 DIAGNOSIS — Z79811 Long term (current) use of aromatase inhibitors: Secondary | ICD-10-CM | POA: Diagnosis not present

## 2017-01-07 DIAGNOSIS — Z23 Encounter for immunization: Secondary | ICD-10-CM | POA: Diagnosis not present

## 2017-01-07 LAB — CBC WITH DIFFERENTIAL/PLATELET
BASOS ABS: 0 10*3/uL (ref 0–0.1)
BASOS PCT: 0 %
EOS ABS: 0 10*3/uL (ref 0–0.7)
Eosinophils Relative: 1 %
HCT: 23.8 % — ABNORMAL LOW (ref 35.0–47.0)
Hemoglobin: 8.4 g/dL — ABNORMAL LOW (ref 12.0–16.0)
LYMPHS PCT: 41 %
Lymphs Abs: 1 10*3/uL (ref 1.0–3.6)
MCH: 38 pg — ABNORMAL HIGH (ref 26.0–34.0)
MCHC: 35.2 g/dL (ref 32.0–36.0)
MCV: 107.8 fL — ABNORMAL HIGH (ref 80.0–100.0)
MONO ABS: 0.2 10*3/uL (ref 0.2–0.9)
Monocytes Relative: 7 %
NEUTROS ABS: 1.3 10*3/uL — AB (ref 1.4–6.5)
NEUTROS PCT: 51 %
Platelets: 17 10*3/uL — CL (ref 150–400)
RBC: 2.21 MIL/uL — ABNORMAL LOW (ref 3.80–5.20)
RDW: 22.3 % — AB (ref 11.5–14.5)
WBC: 2.5 10*3/uL — ABNORMAL LOW (ref 3.6–11.0)

## 2017-01-07 LAB — SAMPLE TO BLOOD BANK

## 2017-01-07 MED ORDER — ELTROMBOPAG OLAMINE 50 MG PO TABS: 50.0000 mg | ORAL_TABLET | Freq: Every day | ORAL | 4 refills | Status: DC

## 2017-01-07 MED ORDER — ROMIPLOSTIM INJECTION 500 MCG
690.0000 ug | Freq: Once | SUBCUTANEOUS | Status: AC
  Administered 2017-01-07: 690 ug via SUBCUTANEOUS
  Filled 2017-01-07: qty 1

## 2017-01-07 NOTE — Progress Notes (Signed)
Patient's platelets 17- while on N plate; suboptimal response/poor response. Recommend switching to Promacta 50 mg a day.

## 2017-01-07 NOTE — Progress Notes (Signed)
Patient informed that she is to receive Nplate with platelets of 17. Dr. Rogue Bussing is considereing trying to get Promacta approved with insurance for patient.  Patient info regarding Promacta printed from [email or web address] and given to patient.

## 2017-01-07 NOTE — Telephone Encounter (Signed)
Oral Oncology Patient Advocate Encounter  Received notification from [facility] that prior authorization for Promacta is required.  PA submitted on CoverMyMeds Key [id number] Status is pending  Oral Oncology Clinic will continue to follow.  [place] Patient Advocate [phone] 01/07/2017 2:42 PM

## 2017-01-08 NOTE — Telephone Encounter (Signed)
Oral Oncology Patient Advocate Encounter  Prior Authorization for Shelly Arias has been approved.    PA# [id number] [place] Effective dates: 01/07/2017 through 07/08/2017  Must use [facility].  Oral Oncology Clinic will continue to follow.    [place] Patient Advocate [phone] 01/08/2017 8:42 AM

## 2017-01-09 ENCOUNTER — Telehealth: Payer: Self-pay | Admitting: Pharmacist

## 2017-01-09 DIAGNOSIS — D696 Thrombocytopenia, unspecified: Secondary | ICD-10-CM

## 2017-01-09 MED ORDER — ELTROMBOPAG OLAMINE 50 MG PO TABS: 50.0000 mg | ORAL_TABLET | Freq: Every day | ORAL | 4 refills | Status: DC

## 2017-01-09 NOTE — Telephone Encounter (Signed)
Oral Oncology Pharmacist Encounter  Received new prescription for Promacta for the treatment of thrombocytopenia, planned duration until disease progression or unacceptable drug toxicity.  CBC from 01/07/17 assessed, no relevant lab abnormalities. CMP will be completed with next lab visit on 01/14/17. Prescription dose and frequency assessed.   Current medication list in Epic reviewed, no relevant DDIs with Promacta identified.  Prescription has been e-scribed to [facility] due to insurance restriction.  Oral Oncology Clinic will continue to follow for copayment issues, initial counseling and start date.  Darl Pikes, PharmD, BCPS Hematology/Oncology Clinical Pharmacist [facility] [place] [phone]  01/09/2017 1:40 PM

## 2017-01-14 ENCOUNTER — Inpatient Hospital Stay: Payer: Medicare Other

## 2017-01-14 ENCOUNTER — Other Ambulatory Visit: Payer: Self-pay | Admitting: Internal Medicine

## 2017-01-14 ENCOUNTER — Inpatient Hospital Stay (HOSPITAL_BASED_OUTPATIENT_CLINIC_OR_DEPARTMENT_OTHER): Payer: Medicare Other | Admitting: Internal Medicine

## 2017-01-14 ENCOUNTER — Inpatient Hospital Stay: Payer: Medicare Other | Attending: Internal Medicine

## 2017-01-14 VITALS — BP 135/65 | HR 97 | Temp 97.8°F | Resp 20 | Ht 61.0 in | Wt 148.9 lb

## 2017-01-14 DIAGNOSIS — D693 Immune thrombocytopenic purpura: Secondary | ICD-10-CM

## 2017-01-14 DIAGNOSIS — M1990 Unspecified osteoarthritis, unspecified site: Secondary | ICD-10-CM

## 2017-01-14 DIAGNOSIS — E785 Hyperlipidemia, unspecified: Secondary | ICD-10-CM | POA: Diagnosis not present

## 2017-01-14 DIAGNOSIS — C50811 Malignant neoplasm of overlapping sites of right female breast: Secondary | ICD-10-CM

## 2017-01-14 DIAGNOSIS — M810 Age-related osteoporosis without current pathological fracture: Secondary | ICD-10-CM

## 2017-01-14 DIAGNOSIS — C50911 Malignant neoplasm of unspecified site of right female breast: Secondary | ICD-10-CM | POA: Diagnosis not present

## 2017-01-14 DIAGNOSIS — Y848 Other medical procedures as the cause of abnormal reaction of the patient, or of later complication, without mention of misadventure at the time of the procedure: Secondary | ICD-10-CM | POA: Insufficient documentation

## 2017-01-14 DIAGNOSIS — I10 Essential (primary) hypertension: Secondary | ICD-10-CM | POA: Diagnosis not present

## 2017-01-14 DIAGNOSIS — Z85828 Personal history of other malignant neoplasm of skin: Secondary | ICD-10-CM | POA: Insufficient documentation

## 2017-01-14 DIAGNOSIS — D696 Thrombocytopenia, unspecified: Secondary | ICD-10-CM

## 2017-01-14 DIAGNOSIS — Z9221 Personal history of antineoplastic chemotherapy: Secondary | ICD-10-CM | POA: Diagnosis not present

## 2017-01-14 DIAGNOSIS — Z79899 Other long term (current) drug therapy: Secondary | ICD-10-CM | POA: Insufficient documentation

## 2017-01-14 DIAGNOSIS — D649 Anemia, unspecified: Secondary | ICD-10-CM

## 2017-01-14 DIAGNOSIS — Z803 Family history of malignant neoplasm of breast: Secondary | ICD-10-CM

## 2017-01-14 DIAGNOSIS — D469 Myelodysplastic syndrome, unspecified: Secondary | ICD-10-CM

## 2017-01-14 DIAGNOSIS — Z923 Personal history of irradiation: Secondary | ICD-10-CM

## 2017-01-14 DIAGNOSIS — R5383 Other fatigue: Secondary | ICD-10-CM | POA: Diagnosis not present

## 2017-01-14 DIAGNOSIS — Z79811 Long term (current) use of aromatase inhibitors: Secondary | ICD-10-CM | POA: Insufficient documentation

## 2017-01-14 DIAGNOSIS — Z170 Estrogen receptor positive status [ER+]: Secondary | ICD-10-CM

## 2017-01-14 DIAGNOSIS — R0602 Shortness of breath: Secondary | ICD-10-CM

## 2017-01-14 DIAGNOSIS — T8092XA Unspecified transfusion reaction, initial encounter: Secondary | ICD-10-CM | POA: Insufficient documentation

## 2017-01-14 DIAGNOSIS — F17210 Nicotine dependence, cigarettes, uncomplicated: Secondary | ICD-10-CM | POA: Insufficient documentation

## 2017-01-14 LAB — COMPREHENSIVE METABOLIC PANEL
ALK PHOS: 70 U/L (ref 38–126)
ALT: 14 U/L (ref 14–54)
AST: 19 U/L (ref 15–41)
Albumin: 3.9 g/dL (ref 3.5–5.0)
Anion gap: 7 (ref 5–15)
BUN: 11 mg/dL (ref 6–20)
CALCIUM: 8.8 mg/dL — AB (ref 8.9–10.3)
CO2: 25 mmol/L (ref 22–32)
CREATININE: 0.66 mg/dL (ref 0.44–1.00)
Chloride: 102 mmol/L (ref 101–111)
GFR calc non Af Amer: >60 mL/min (ref >60)
GLUCOSE: 91 mg/dL (ref 65–99)
Potassium: 4.1 mmol/L (ref 3.5–5.1)
SODIUM: 134 mmol/L — AB (ref 135–145)
Total Bilirubin: 0.7 mg/dL (ref 0.3–1.2)
Total Protein: 6.4 g/dL — ABNORMAL LOW (ref 6.5–8.1)

## 2017-01-14 LAB — CBC WITH DIFFERENTIAL/PLATELET
BASOS ABS: 0 10*3/uL (ref 0–0.1)
Basophils Relative: 0 %
EOS PCT: 1 %
Eosinophils Absolute: 0 10*3/uL (ref 0–0.7)
HCT: 23.4 % — ABNORMAL LOW (ref 35.0–47.0)
Hemoglobin: 8.1 g/dL — ABNORMAL LOW (ref 12.0–16.0)
LYMPHS ABS: 1 10*3/uL (ref 1.0–3.6)
LYMPHS PCT: 37 %
MCH: 37.5 pg — AB (ref 26.0–34.0)
MCHC: 34.5 g/dL (ref 32.0–36.0)
MCV: 108.6 fL — AB (ref 80.0–100.0)
MONO ABS: 0.2 10*3/uL (ref 0.2–0.9)
Monocytes Relative: 6 %
Neutro Abs: 1.6 10*3/uL (ref 1.4–6.5)
Neutrophils Relative %: 56 %
PLATELETS: 18 10*3/uL — AB (ref 150–400)
RBC: 2.16 MIL/uL — ABNORMAL LOW (ref 3.80–5.20)
RDW: 22.5 % — AB (ref 11.5–14.5)
WBC: 2.8 10*3/uL — ABNORMAL LOW (ref 3.6–11.0)

## 2017-01-14 LAB — SAMPLE TO BLOOD BANK

## 2017-01-14 MED ORDER — ROMIPLOSTIM INJECTION 500 MCG
690.0000 ug | Freq: Once | SUBCUTANEOUS | Status: AC
  Administered 2017-01-14: 690 ug via SUBCUTANEOUS
  Filled 2017-01-14: qty 0.38

## 2017-01-14 NOTE — Assessment & Plan Note (Addendum)
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 On Femara.  AUG 10th - improved response- improvement of the hilar/mediastinal adenopathy. Tolerating Femara well without any major side effects. No clinical progression noted.   # Severe thrombocytopenia//anemia- highly suspicious for MDS [s/p III opinion at [facility]] however extensive workup negative for any obvious cause of her severe thrombocytopenia.  Patient's platelets 17  today; continue N-plate q weekly [58 mics per kilogram weekly]. However given the suboptimal response from N-plate- I would recommend switching to Promacta 50 mg once a day. This has been approved by insurance- awaiting the drug from [facility]. Discussed with the oral pharmacist.  Patient will start once available.   # Severe anemia Anemia- Hb 8.1 s/p transfusion [approximately 1 month ago]. Awaiting opinion [facility] [place]- first week of December 2018. Discussed the likely need for a bone marrow biopsy [patient previously had 2 bone marrow biopsies without any clear diagnosis].   # Arthralgais- secondary to AI-improved. Recommend high-dose VitD3 weekly times. / glucoasome/ chrondrotin.   # weekly cbc/endplate/B12 q 4 week -  follow-up with me approximately 4 weeks; platelets- tomorrow; 1 week/cb/hold tube/ day-2 PRBC transfusion.

## 2017-01-14 NOTE — Progress Notes (Signed)
1055 am - received phone call from [facility] in cancer ct lab-critical plt count of 18. Read back process performed with lab tech. Dr. Rogue Bussing informed 1120 am of critical value. Read back process performed with md.  Pt reports intermittent nose bleed.

## 2017-01-14 NOTE — Progress Notes (Signed)
[place] OFFICE PROGRESS NOTE  Patient Care Team: [name], MD as PCP - General (Family Medicine) [name], DPM as Consulting Physician (Podiatry) [name], MD as Consulting Physician (Internal Medicine) Robert Bellow, MD (General Surgery)   SUMMARY OF ONCOLOGIC HISTORY:  Oncology History   # April 2018- Left chest wall Bx- IMC; ER-PR-POS; her 2 Neu NEG; PET- mild hilar/distal adenopathy; ~1 cm right lung nodule/ several sub-centimeter.   # [facility] Abemacliclib [on HOLD sec to cytopenia]  # April 2017-isolated Thrombocytopenia- platelets- 113; worsening PANCYTOPENIA- April 2018- BMBx- no evidence of malignancy; MDS/Acute leuk; Karyotype/MDS- FISH panel-NEG [d/w Dr.Smir];   # June 2018- REPEAT BMBx/[facility] [June 2018-Dr.Foster; Aug 2018- Dr.Powell at [facility]]; No Diagnosis.   # 2006- RIGHT BREAST CA [pT1cN0M0; STAGE I; ER/PRPos; Her 2 Neu-NEG] s/p FEC x 6 [NSABP B-36]; Femara [Dec 2007-2012]  # Osteoporosis s/p Reclast; BMD- 2015-wnl- ca+vit D   Dr.Stewart [derm ? Squamous cell s/p freeze-oct 2018]        Carcinoma of overlapping sites of right breast in female, estrogen receptor positive ([place])     INTERVAL HISTORY:  A very pleasant 71 year old female patient With newly diagnosed recurrent breast cancer- ER/PR positive HER-2/neu negative currently on Femara; And pancytopenia/ with more severe thrombocytopenia- currently on N-plate is here for follow-up.  Patient noted to have a nosebleed over the weekend. She also complains of easy bruising; no gum bleeding. She denies any petechial rash on the legs. She denies any blood in stools black stools or bleeding gums.   Patient also complains of increasing shortness of breath/fatigue. She continues to take Femara. No weight loss. Appetite is good.   REVIEW OF SYSTEMS:  A complete 10 point review of system is done which is negative except mentioned above/history of present  illness.   PAST MEDICAL HISTORY :  Past Medical History:  Diagnosis Date  . Breast cancer ([place])    right, lumpectomy, radiation, chemo  . Breast cancer ([place])    Right, 2007  . 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  . Headache   . History of chemotherapy   . History of radiation therapy   . Hyperlipidemia   . Hypertension   . Osteoarthritis    right hip  . Osteoporosis   . Squamous cell carcinoma    leg, Followed by Dr. Nicole Kindred    PAST SURGICAL HISTORY :   Past Surgical History:  Procedure Laterality Date  . ABDOMINAL HYSTERECTOMY    . BREAST BIOPSY Left 2002   left breast, calcifications  . BREAST BIOPSY Left 06/20/2016   INVASIVE DUCTAL CARCINOMA.  Marland Kitchen BREAST EXCISIONAL BIOPSY Right 2007   positive  . bunion repair    . left breast biopsy    . NASAL SINUS SURGERY    . right hip replacement    . SHOULDER SURGERY    . SQUAMOUS CELL CARCINOMA EXCISION     right leg, Dr. Nicole Kindred  . TOTAL HIP ARTHROPLASTY     right    FAMILY HISTORY :   Family History  Problem Relation Age of Onset  . Heart disease Father 51  . Heart disease Mother   . Hyperlipidemia Mother   . Heart disease Brother   . Diabetes Maternal Grandmother   . Breast cancer Cousin     SOCIAL HISTORY:   Social History   Tobacco Use  . Smoking status: Current Some Day Smoker    Packs/day: 0.10  Years: 30.00    Pack years: 3.00    Types: Cigarettes  . Smokeless tobacco: Never Used  Substance Use Topics  . Alcohol use: Yes    Alcohol/week: 0.0 oz    Comment: socially  . Drug use: No    ALLERGIES:  is allergic to codeine; fish-derived products; morphine sulfate; fish allergy; morphine sulfate; and oxycodone.  MEDICATIONS:  Current Outpatient Medications  Medication Sig Dispense Refill  . ALPRAZolam (XANAX) 0.5 MG tablet TAKE 1 TABLET BY MOUTH EVERY 8 HOURS AS NEEDED FOR ANXIETY OR SLEEP 30 tablet 2  . Cholecalciferol (VITAMIN D3) 1000 units CAPS Take 1 capsule by  mouth daily.     . ergocalciferol (VITAMIN D2) 50000 units capsule Take 1 capsule (50,000 Units total) by mouth once a week. 12 capsule 1  . Fexofenadine HCl (ALLEGRA PO) Take 1 capsule by mouth.    . glucosamine-chondroitin 500-400 MG tablet Take 1 tablet by mouth 3 (three) times daily.    . letrozole (FEMARA) 2.5 MG tablet Take 1 tablet (2.5 mg total) by mouth daily. Once a day. 90 tablet 3  . Melatonin 10 MG TABS Take 1 tablet by mouth at bedtime.    . metoprolol succinate (TOPROL-XL) 25 MG 24 hr tablet Take 1 tablet (25 mg total) by mouth daily. 90 tablet 3  . pravastatin (PRAVACHOL) 40 MG tablet Take 1 tablet (40 mg total) by mouth daily. 90 tablet 3  . Probiotic Product (PROBIOTIC & ACIDOPHILUS EX ST PO) Take 1 tablet by mouth daily.     . eltrombopag (PROMACTA) 50 MG tablet Take 1 tablet (50 mg total) by mouth daily. Take on an empty stomach 1 hour before a meal or 2 hours after (Patient not taking: Reported on 01/14/2017) 30 tablet 4   No current facility-administered medications for this visit.     PHYSICAL EXAMINATION: ECOG PERFORMANCE STATUS: 0 - Asymptomatic  BP 135/65 (Patient Position: Sitting)   Pulse 97   Temp 97.8 F (36.6 C) (Tympanic)   Resp 20   Ht 5' 1" (1.549 m)   Wt 148 lb 14.4 oz (67.5 kg)   BMI 28.13 kg/m   Filed Weights   01/14/17 1111  Weight: 148 lb 14.4 oz (67.5 kg)    GENERAL: Well-nourished well-developed; Alert, no distress and comfortable.  Accompanied by Her husband. EYES: WNL.  OROPHARYNX: no thrush or ulceration; good dentition. No bleeding noted. NECK: supple, no masses felt LYMPH:  no palpable lymphadenopathy in the cervical, axillary or inguinal regions LUNGS: clear to auscultation and  No wheeze or crackles HEART/CVS: regular rate & rhythm and no murmurs; No lower extremity edema ABDOMEN:abdomen soft, non-tender and normal bowel sounds Musculoskeletal:no cyanosis of digits and no clubbing  PSYCH: alert & oriented x 3 with fluent  speech NEURO: no focal motor/sensory deficits SKIN:  Faint bilateral lower extremity petechial rash noted.  Subcutaneous metastatic nodules felt. Improving.   LABORATORY DATA:  I have reviewed the data as listed    Component Value Date/Time   NA 134 (L) 01/14/2017 1037   K 4.1 01/14/2017 1037   CL 102 01/14/2017 1037   CO2 25 01/14/2017 1037   GLUCOSE 91 01/14/2017 1037   BUN 11 01/14/2017 1037   CREATININE 0.66 01/14/2017 1037   CREATININE 0.67 07/09/2014 1321   CREATININE 0.70 04/30/2014 1455   CALCIUM 8.8 (L) 01/14/2017 1037   PROT 6.4 (L) 01/14/2017 1037   PROT 7.3 07/09/2014 1321   ALBUMIN 3.9 01/14/2017 1037   ALBUMIN 4.3   07/09/2014 1321   AST 19 01/14/2017 1037   AST 19 07/09/2014 1321   ALT 14 01/14/2017 1037   ALT 18 07/09/2014 1321   ALKPHOS 70 01/14/2017 1037   ALKPHOS 66 07/09/2014 1321   BILITOT 0.7 01/14/2017 1037   BILITOT 0.6 07/09/2014 1321   GFRNONAA >60 01/14/2017 1037   GFRNONAA >60 07/09/2014 1321   GFRAA >60 01/14/2017 1037   GFRAA >60 07/09/2014 1321    No results found for: SPEP, UPEP  Lab Results  Component Value Date   WBC 2.8 (L) 01/14/2017   NEUTROABS 1.6 01/14/2017   HGB 8.1 (L) 01/14/2017   HCT 23.4 (L) 01/14/2017   MCV 108.6 (H) 01/14/2017   PLT 18 (LL) 01/14/2017      Chemistry      Component Value Date/Time   NA 134 (L) 01/14/2017 1037   K 4.1 01/14/2017 1037   CL 102 01/14/2017 1037   CO2 25 01/14/2017 1037   BUN 11 01/14/2017 1037   CREATININE 0.66 01/14/2017 1037   CREATININE 0.67 07/09/2014 1321   CREATININE 0.70 04/30/2014 1455      Component Value Date/Time   CALCIUM 8.8 (L) 01/14/2017 1037   ALKPHOS 70 01/14/2017 1037   ALKPHOS 66 07/09/2014 1321   AST 19 01/14/2017 1037   AST 19 07/09/2014 1321   ALT 14 01/14/2017 1037   ALT 18 07/09/2014 1321   BILITOT 0.7 01/14/2017 1037   BILITOT 0.6 07/09/2014 1321        ASSESSMENT & PLAN:   Carcinoma of overlapping sites of right breast in female, estrogen  receptor positive ([facility]) 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 On Femara.  AUG 10th - improved response- improvement of the hilar/mediastinal adenopathy. Tolerating Femara well without any major side effects. No clinical progression noted.   # Severe thrombocytopenia//anemia- highly suspicious for MDS [s/p III opinion at [facility]] however extensive workup negative for any obvious cause of her severe thrombocytopenia.  Patient's platelets 17  today; continue N-plate q weekly [10 mics per kilogram weekly]. However given the suboptimal response from N-plate- I would recommend switching to Promacta 50 mg once a day. This has been approved by insurance- [facility] Discussed with the oral pharmacist.  Patient will start once available.   # Severe anemia Anemia- Hb 8.1 s/p transfusion [approximately 1 month ago]. Awaiting opinion [facility] [place]- first week of December 2018. Discussed the likely need for a bone marrow biopsy [patient previously had 2 bone marrow biopsies without any clear diagnosis].   # Arthralgais- secondary to AI-improved. Recommend high-dose VitD3 weekly times. / glucoasome/ chrondrotin.   # weekly cbc/endplate/B12 q 4 week -  follow-up with me approximately 4 weeks; platelets- tomorrow; 1 week/cb/hold tube/ day-2 PRBC transfusion.      Govinda R Brahmanday, MD 01/14/2017 1:17 PM 

## 2017-01-15 ENCOUNTER — Telehealth: Payer: Self-pay | Admitting: Internal Medicine

## 2017-01-15 ENCOUNTER — Inpatient Hospital Stay: Payer: Medicare Other

## 2017-01-15 ENCOUNTER — Encounter: Payer: Self-pay | Admitting: Internal Medicine

## 2017-01-15 DIAGNOSIS — D649 Anemia, unspecified: Secondary | ICD-10-CM | POA: Diagnosis not present

## 2017-01-15 DIAGNOSIS — T8092XA Unspecified transfusion reaction, initial encounter: Secondary | ICD-10-CM | POA: Diagnosis not present

## 2017-01-15 DIAGNOSIS — Z170 Estrogen receptor positive status [ER+]: Secondary | ICD-10-CM | POA: Diagnosis not present

## 2017-01-15 DIAGNOSIS — Z79811 Long term (current) use of aromatase inhibitors: Secondary | ICD-10-CM | POA: Diagnosis not present

## 2017-01-15 DIAGNOSIS — D696 Thrombocytopenia, unspecified: Secondary | ICD-10-CM

## 2017-01-15 DIAGNOSIS — C50911 Malignant neoplasm of unspecified site of right female breast: Secondary | ICD-10-CM | POA: Diagnosis not present

## 2017-01-15 MED ORDER — SODIUM CHLORIDE 0.9 % IV SOLN
250.0000 mL | Freq: Once | INTRAVENOUS | Status: AC
  Administered 2017-01-15: 250 mL via INTRAVENOUS
  Filled 2017-01-15: qty 250

## 2017-01-15 MED ORDER — ACETAMINOPHEN 325 MG PO TABS
650.0000 mg | ORAL_TABLET | Freq: Once | ORAL | Status: AC
  Administered 2017-01-15: 650 mg via ORAL

## 2017-01-15 MED ORDER — METHYLPREDNISOLONE SODIUM SUCC 125 MG IJ SOLR
40.0000 mg | Freq: Once | INTRAMUSCULAR | Status: AC
  Administered 2017-01-15: 40 mg via INTRAVENOUS

## 2017-01-15 MED ORDER — DIPHENHYDRAMINE HCL 25 MG PO CAPS
25.0000 mg | ORAL_CAPSULE | Freq: Once | ORAL | Status: AC
  Administered 2017-01-15: 25 mg via ORAL

## 2017-01-15 NOTE — Telephone Encounter (Signed)
Oral Oncology Patient Advocate Encounter   Called [facility] Patient [facility] is [facility] [place] Patient will pick up from store. They have contacted our patient.    [place] Patient Advocate [phone] 01/15/2017 3:08 PM

## 2017-01-16 LAB — BPAM PLATELET PHERESIS
BLOOD PRODUCT EXPIRATION DATE: 201811082359
ISSUE DATE / TIME: 201811061421
Unit Type and Rh: 6200

## 2017-01-16 LAB — PREPARE PLATELET PHERESIS: UNIT DIVISION: 0

## 2017-01-21 ENCOUNTER — Other Ambulatory Visit: Payer: Self-pay | Admitting: *Deleted

## 2017-01-21 ENCOUNTER — Telehealth: Payer: Self-pay | Admitting: *Deleted

## 2017-01-21 ENCOUNTER — Inpatient Hospital Stay: Payer: Medicare Other

## 2017-01-21 DIAGNOSIS — D649 Anemia, unspecified: Secondary | ICD-10-CM

## 2017-01-21 DIAGNOSIS — Z79811 Long term (current) use of aromatase inhibitors: Secondary | ICD-10-CM | POA: Diagnosis not present

## 2017-01-21 DIAGNOSIS — C50911 Malignant neoplasm of unspecified site of right female breast: Secondary | ICD-10-CM | POA: Diagnosis not present

## 2017-01-21 DIAGNOSIS — T8092XA Unspecified transfusion reaction, initial encounter: Secondary | ICD-10-CM | POA: Diagnosis not present

## 2017-01-21 DIAGNOSIS — D696 Thrombocytopenia, unspecified: Secondary | ICD-10-CM | POA: Diagnosis not present

## 2017-01-21 DIAGNOSIS — C50811 Malignant neoplasm of overlapping sites of right female breast: Secondary | ICD-10-CM

## 2017-01-21 DIAGNOSIS — Z170 Estrogen receptor positive status [ER+]: Principal | ICD-10-CM

## 2017-01-21 DIAGNOSIS — D469 Myelodysplastic syndrome, unspecified: Secondary | ICD-10-CM

## 2017-01-21 LAB — CBC WITH DIFFERENTIAL/PLATELET
BASOS PCT: 0 %
Basophils Absolute: 0 10*3/uL (ref 0–0.1)
EOS ABS: 0 10*3/uL (ref 0–0.7)
EOS PCT: 1 %
HCT: 22.4 % — ABNORMAL LOW (ref 35.0–47.0)
Hemoglobin: 7.8 g/dL — ABNORMAL LOW (ref 12.0–16.0)
Lymphocytes Relative: 44 %
Lymphs Abs: 1.3 10*3/uL (ref 1.0–3.6)
MCH: 38.5 pg — ABNORMAL HIGH (ref 26.0–34.0)
MCHC: 34.9 g/dL (ref 32.0–36.0)
MCV: 110.3 fL — ABNORMAL HIGH (ref 80.0–100.0)
MONO ABS: 0.2 10*3/uL (ref 0.2–0.9)
MONOS PCT: 8 %
Neutro Abs: 1.4 10*3/uL (ref 1.4–6.5)
Neutrophils Relative %: 47 %
Platelets: 18 10*3/uL — CL (ref 150–400)
RBC: 2.03 MIL/uL — ABNORMAL LOW (ref 3.80–5.20)
RDW: 22.4 % — ABNORMAL HIGH (ref 11.5–14.5)
WBC: 3 10*3/uL — ABNORMAL LOW (ref 3.6–11.0)

## 2017-01-21 LAB — TYPE AND SCREEN
ABO/RH(D): O POS
Antibody Screen: POSITIVE
DONOR AG TYPE: NEGATIVE
UNIT DIVISION: 0
Unit tag comment: NEGATIVE

## 2017-01-21 LAB — BPAM RBC
BLOOD PRODUCT EXPIRATION DATE: 201810222359
ISSUE DATE / TIME: 201810091003
UNIT TYPE AND RH: 5100

## 2017-01-21 LAB — PREPARE RBC (CROSSMATCH)

## 2017-01-21 NOTE — Telephone Encounter (Signed)
Incoming call from [place] in ctr.ctr lab - 11:01 am. Critical plt count called to Lafaye Mcelmurry RN-plt count 18-estimate with out manual diff. Read back process completed. Dr. Rogue Bussing informed of critical plt count at 1110am. md will infuse 1 unit of plts and 1 unit of blood tom. (hgb 7.8)

## 2017-01-21 NOTE — Progress Notes (Signed)
Patient started the oral Promacta 3 days ago.  She will not be receiving Nplate injection while taking the Promacta.  Is scheduled to return tomorrow for blood and platelet transfusion.

## 2017-01-22 ENCOUNTER — Inpatient Hospital Stay: Payer: Medicare Other

## 2017-01-22 DIAGNOSIS — D649 Anemia, unspecified: Secondary | ICD-10-CM | POA: Diagnosis not present

## 2017-01-22 DIAGNOSIS — Z170 Estrogen receptor positive status [ER+]: Secondary | ICD-10-CM | POA: Diagnosis not present

## 2017-01-22 DIAGNOSIS — D696 Thrombocytopenia, unspecified: Secondary | ICD-10-CM | POA: Diagnosis not present

## 2017-01-22 DIAGNOSIS — C50911 Malignant neoplasm of unspecified site of right female breast: Secondary | ICD-10-CM | POA: Diagnosis not present

## 2017-01-22 DIAGNOSIS — Z79811 Long term (current) use of aromatase inhibitors: Secondary | ICD-10-CM | POA: Diagnosis not present

## 2017-01-22 DIAGNOSIS — T8092XA Unspecified transfusion reaction, initial encounter: Secondary | ICD-10-CM | POA: Diagnosis not present

## 2017-01-22 MED ORDER — ACETAMINOPHEN 325 MG PO TABS
ORAL_TABLET | ORAL | Status: AC
  Filled 2017-01-22: qty 2

## 2017-01-22 MED ORDER — SODIUM CHLORIDE 0.9 % IV SOLN
250.0000 mL | Freq: Once | INTRAVENOUS | Status: AC
  Administered 2017-01-22: 250 mL via INTRAVENOUS
  Filled 2017-01-22: qty 250

## 2017-01-22 MED ORDER — METHYLPREDNISOLONE SODIUM SUCC 125 MG IJ SOLR
INTRAMUSCULAR | Status: AC
  Filled 2017-01-22: qty 2

## 2017-01-22 MED ORDER — ACETAMINOPHEN 325 MG PO TABS
650.0000 mg | ORAL_TABLET | Freq: Once | ORAL | Status: AC
  Administered 2017-01-22: 650 mg via ORAL

## 2017-01-22 MED ORDER — DIPHENHYDRAMINE HCL 25 MG PO CAPS
25.0000 mg | ORAL_CAPSULE | Freq: Once | ORAL | Status: AC
  Administered 2017-01-22: 25 mg via ORAL

## 2017-01-22 MED ORDER — METHYLPREDNISOLONE SODIUM SUCC 125 MG IJ SOLR
40.0000 mg | Freq: Once | INTRAMUSCULAR | Status: AC
  Administered 2017-01-22: 40 mg via INTRAVENOUS

## 2017-01-22 MED ORDER — DIPHENHYDRAMINE HCL 25 MG PO CAPS
ORAL_CAPSULE | ORAL | Status: AC
  Filled 2017-01-22: qty 1

## 2017-01-23 LAB — TYPE AND SCREEN
ABO/RH(D): O POS
ANTIBODY SCREEN: NEGATIVE
Donor AG Type: NEGATIVE
UNIT DIVISION: 0
Unit tag comment: NEGATIVE

## 2017-01-23 LAB — BPAM RBC
BLOOD PRODUCT EXPIRATION DATE: 201812012359
ISSUE DATE / TIME: 201811131000
UNIT TYPE AND RH: 5100

## 2017-01-23 LAB — BPAM PLATELET PHERESIS
Blood Product Expiration Date: 201811162359
ISSUE DATE / TIME: 201811131207
Unit Type and Rh: 5100

## 2017-01-23 LAB — PREPARE PLATELET PHERESIS: Unit division: 0

## 2017-01-28 ENCOUNTER — Other Ambulatory Visit: Payer: Self-pay | Admitting: Internal Medicine

## 2017-01-28 ENCOUNTER — Inpatient Hospital Stay: Payer: Medicare Other

## 2017-01-28 ENCOUNTER — Other Ambulatory Visit: Payer: Self-pay | Admitting: *Deleted

## 2017-01-28 ENCOUNTER — Telehealth: Payer: Self-pay | Admitting: *Deleted

## 2017-01-28 DIAGNOSIS — D469 Myelodysplastic syndrome, unspecified: Secondary | ICD-10-CM

## 2017-01-28 DIAGNOSIS — D696 Thrombocytopenia, unspecified: Secondary | ICD-10-CM

## 2017-01-28 DIAGNOSIS — Z170 Estrogen receptor positive status [ER+]: Secondary | ICD-10-CM | POA: Diagnosis not present

## 2017-01-28 DIAGNOSIS — C50811 Malignant neoplasm of overlapping sites of right female breast: Secondary | ICD-10-CM

## 2017-01-28 DIAGNOSIS — Z79811 Long term (current) use of aromatase inhibitors: Secondary | ICD-10-CM | POA: Diagnosis not present

## 2017-01-28 DIAGNOSIS — T8092XA Unspecified transfusion reaction, initial encounter: Secondary | ICD-10-CM | POA: Diagnosis not present

## 2017-01-28 DIAGNOSIS — D649 Anemia, unspecified: Secondary | ICD-10-CM | POA: Diagnosis not present

## 2017-01-28 DIAGNOSIS — C50911 Malignant neoplasm of unspecified site of right female breast: Secondary | ICD-10-CM | POA: Diagnosis not present

## 2017-01-28 LAB — CBC WITH DIFFERENTIAL/PLATELET
Basophils Absolute: 0 10*3/uL (ref 0–0.1)
Basophils Relative: 0 %
Eosinophils Absolute: 0 10*3/uL (ref 0–0.7)
Eosinophils Relative: 1 %
HEMATOCRIT: 27.1 % — AB (ref 35.0–47.0)
HEMOGLOBIN: 9.5 g/dL — AB (ref 12.0–16.0)
LYMPHS PCT: 39 %
Lymphs Abs: 1.3 10*3/uL (ref 1.0–3.6)
MCH: 37.5 pg — ABNORMAL HIGH (ref 26.0–34.0)
MCHC: 34.9 g/dL (ref 32.0–36.0)
MCV: 107.3 fL — AB (ref 80.0–100.0)
MONO ABS: 0.2 10*3/uL (ref 0.2–0.9)
MONOS PCT: 7 %
NEUTROS ABS: 1.7 10*3/uL (ref 1.4–6.5)
Neutrophils Relative %: 53 %
Platelets: 16 10*3/uL — CL (ref 150–400)
RBC: 2.53 MIL/uL — ABNORMAL LOW (ref 3.80–5.20)
RDW: 21.4 % — AB (ref 11.5–14.5)
WBC: 3.3 10*3/uL — ABNORMAL LOW (ref 3.6–11.0)

## 2017-01-28 LAB — SAMPLE TO BLOOD BANK

## 2017-01-28 NOTE — Progress Notes (Signed)
I received a call from [facility] in cancer center lab at 11:35 am. Patient's critical plt count called in to me, with plt count 16 - this was an estimate with out manual diff. Read back process completed. Dr. Rogue Bussing informed of critical plt count. MD will infuse 1 unit of plts tomorrow 01/29/17. Orders will be put in.

## 2017-01-28 NOTE — Telephone Encounter (Signed)
Spoke with patient. Confirmed with patient-she will be in SDS at 8am tomorrow for her unit of plts.

## 2017-01-28 NOTE — Progress Notes (Signed)
Unable to accommodate 1 unit of plts in c.ctr on Tuesday/Wednesday due to high acuity. Left msg with charge nurse in [facility] dept to see if SDS dept can accommodate.

## 2017-01-28 NOTE — Telephone Encounter (Signed)
Left 2 vms for patient to return my phone call asap. Pt needs 1 unit of plt tom. SDS can accommodate pt tom, but pt must arrive at 8am to [facility] dept

## 2017-01-29 ENCOUNTER — Other Ambulatory Visit: Payer: Self-pay

## 2017-01-29 ENCOUNTER — Encounter: Payer: Self-pay | Admitting: Nurse Practitioner

## 2017-01-29 ENCOUNTER — Ambulatory Visit
Admission: RE | Admit: 2017-01-29 | Discharge: 2017-01-29 | Disposition: A | Discharge status: Home / Self Care | Payer: Medicare Other | Source: Ambulatory Visit | Attending: Internal Medicine | Admitting: Internal Medicine

## 2017-01-29 ENCOUNTER — Inpatient Hospital Stay (HOSPITAL_BASED_OUTPATIENT_CLINIC_OR_DEPARTMENT_OTHER): Payer: Medicare Other | Admitting: Nurse Practitioner

## 2017-01-29 VITALS — BP 146/76 | HR 79 | Temp 96.1°F

## 2017-01-29 DIAGNOSIS — D649 Anemia, unspecified: Secondary | ICD-10-CM | POA: Diagnosis not present

## 2017-01-29 DIAGNOSIS — Z79811 Long term (current) use of aromatase inhibitors: Secondary | ICD-10-CM | POA: Diagnosis not present

## 2017-01-29 DIAGNOSIS — R0602 Shortness of breath: Secondary | ICD-10-CM

## 2017-01-29 DIAGNOSIS — Z79899 Other long term (current) drug therapy: Secondary | ICD-10-CM

## 2017-01-29 DIAGNOSIS — Z85828 Personal history of other malignant neoplasm of skin: Secondary | ICD-10-CM

## 2017-01-29 DIAGNOSIS — Z923 Personal history of irradiation: Secondary | ICD-10-CM

## 2017-01-29 DIAGNOSIS — Z9221 Personal history of antineoplastic chemotherapy: Secondary | ICD-10-CM

## 2017-01-29 DIAGNOSIS — E785 Hyperlipidemia, unspecified: Secondary | ICD-10-CM

## 2017-01-29 DIAGNOSIS — R58 Hemorrhage, not elsewhere classified: Secondary | ICD-10-CM | POA: Insufficient documentation

## 2017-01-29 DIAGNOSIS — I10 Essential (primary) hypertension: Secondary | ICD-10-CM

## 2017-01-29 DIAGNOSIS — T8092XA Unspecified transfusion reaction, initial encounter: Secondary | ICD-10-CM | POA: Diagnosis not present

## 2017-01-29 DIAGNOSIS — D696 Thrombocytopenia, unspecified: Secondary | ICD-10-CM | POA: Diagnosis not present

## 2017-01-29 DIAGNOSIS — Z170 Estrogen receptor positive status [ER+]: Secondary | ICD-10-CM

## 2017-01-29 DIAGNOSIS — Z803 Family history of malignant neoplasm of breast: Secondary | ICD-10-CM

## 2017-01-29 DIAGNOSIS — C50911 Malignant neoplasm of unspecified site of right female breast: Secondary | ICD-10-CM | POA: Diagnosis not present

## 2017-01-29 DIAGNOSIS — M810 Age-related osteoporosis without current pathological fracture: Secondary | ICD-10-CM | POA: Diagnosis not present

## 2017-01-29 DIAGNOSIS — F17210 Nicotine dependence, cigarettes, uncomplicated: Secondary | ICD-10-CM

## 2017-01-29 DIAGNOSIS — R5383 Other fatigue: Secondary | ICD-10-CM | POA: Diagnosis not present

## 2017-01-29 DIAGNOSIS — M1990 Unspecified osteoarthritis, unspecified site: Secondary | ICD-10-CM

## 2017-01-29 MED ORDER — METHYLPREDNISOLONE SODIUM SUCC 125 MG IJ SOLR
40.0000 mg | Freq: Once | INTRAMUSCULAR | Status: AC
  Administered 2017-01-29: 40 mg via INTRAVENOUS

## 2017-01-29 MED ORDER — ACETAMINOPHEN 325 MG PO TABS
650.0000 mg | ORAL_TABLET | Freq: Once | ORAL | Status: AC
  Administered 2017-01-29: 650 mg via ORAL

## 2017-01-29 MED ORDER — DIPHENHYDRAMINE HCL 25 MG PO CAPS
ORAL_CAPSULE | ORAL | Status: AC
  Administered 2017-01-29: 25 mg via ORAL
  Filled 2017-01-29: qty 1

## 2017-01-29 MED ORDER — SODIUM CHLORIDE 0.9% FLUSH: 10.0000 mL | INTRAVENOUS | Status: DC | PRN

## 2017-01-29 MED ORDER — SODIUM CHLORIDE 0.9% FLUSH: 3.0000 mL | INTRAVENOUS | Status: DC | PRN

## 2017-01-29 MED ORDER — DIPHENHYDRAMINE HCL 25 MG PO CAPS
25.0000 mg | ORAL_CAPSULE | Freq: Once | ORAL | Status: AC
  Administered 2017-01-29: 25 mg via ORAL

## 2017-01-29 MED ORDER — METHYLPREDNISOLONE SODIUM SUCC 125 MG IJ SOLR
INTRAMUSCULAR | Status: AC
  Administered 2017-01-29: 40 mg via INTRAVENOUS
  Filled 2017-01-29: qty 2

## 2017-01-29 MED ORDER — SODIUM CHLORIDE 0.9 % IV SOLN
250.0000 mL | Freq: Once | INTRAVENOUS | Status: AC
  Administered 2017-01-29: 250 mL via INTRAVENOUS

## 2017-01-29 MED ORDER — PREDNISONE 20 MG PO TABS: 40.0000 mg | ORAL_TABLET | Freq: Every day | ORAL | 0 refills | Status: DC

## 2017-01-29 MED ORDER — ACETAMINOPHEN 325 MG PO TABS
ORAL_TABLET | ORAL | Status: AC
  Administered 2017-01-29: 650 mg via ORAL
  Filled 2017-01-29: qty 2

## 2017-01-29 NOTE — OR Nursing (Signed)
Pt and husband returned to room, pt states she is now itching and has a few whelps on arms,  Denies any chest pain or tightness or throat swelling.  Has 4 nickel sized whelps on right arm and three on right, no distress,  VSS.  Attempt to contact Dr Rogue Bussing at [facility] center.  Brooke at [employer] center told of patient situation.  Awaiting instruction and orders. At 1131

## 2017-01-29 NOTE — OR Nursing (Signed)
Orders from Dr Rogue Bussing to take patient to [facility] for evaluation by PA.  Pt escorted via w/c stable to Cancer center triage.  Whelps appear to be diminishing and less itchy.

## 2017-01-29 NOTE — Progress Notes (Signed)
Symptom Management Consult note [facility]  Telephone:([phone] [phone]  Patient Care Team: [name], MD as PCP - General (Family Medicine) [name], DPM as Consulting Physician (Podiatry) [name], MD as Consulting Physician (Internal Medicine) Bary Castilla Forest Gleason, MD (General Surgery)   Name of the patient: Shelly Arias  [id number]  Oct 10, 1945   Date of visit: 01/29/17  Diagnosis- ER/PR+ Her2Neu- right breast cancer pT1cN0M0 stage I  Chief complaint/ Reason for visit- hives during platelet infusion  Heme/Onc history: ER/PR positive HER-2/neu negative recurrent breast cancer, currently on Femara.  Most recently evaluated by primary oncologist, Dr. Rogue Bussing, 01/14/17. 10/19/16 imaging showed improved response of hilar and mediastinal adenopathy. Tolerating Femara well.  History of pancytopenia with more severe thrombocytopenia that is highly suspicious for MDS (s/p III opinion at [facility]) however extensive workup negative for obvious cause. She has taken N-plate weekly but response was suboptimal and patient was switched to Promacta 64m daily. She has required several transfusions of platelets including on 01/22/17. Patient has history of reactions to platelets. Additionally she has had severe anemia with transfusion of pRBCs on 01/22/17. [facility] [place]. Previously bone marrow biopsies have not shown clear diagnosis.   Interval history- Today, patient was receiving platelet infusion today in same day surgery. Per nursing, patient had tolerated infusion well denying any signs and symptoms of reaction. IV was discontinued and patient was discharged. Upon walking out, patient began complaining of itching and hives on arms. Nursing reported for nickel-sized hives on the right arm and 3 on left. Patient denied chest pain tightness, throat itching or swelling.  Denied difficulty breathing. Denied back pain. Vital signs were taken and were normal. Dr. BRogue Bussingwas consulted who recommended assessment of patient cancer Center by symptom management. Prior to transfusion patient had received Solu-Medrol 40 mg, Benadryl 25 mg, and Tylenol 650 mg.   On assessment, patient complains of itching on her arms and back. Denies dyspnea, wheezing, anxiety, shortness of breath, anxiety, palpitations, back pain, chills, shaking, nausea/vomiting, discolored urine, or dizziness. Denies swelling. States that she has had similar reactions before however this is mild in comparison. Feels symptoms are improving.  ECOG FS:0 - Asymptomatic  Review of systems- Review of Systems  Constitutional: Negative for chills, diaphoresis, fever and malaise/fatigue.  HENT: Negative for ear pain, nosebleeds and sore throat.   Eyes: Negative for blurred vision and double vision.  Respiratory: Negative for cough, hemoptysis, shortness of breath and wheezing.   Cardiovascular: Negative for chest pain, palpitations and leg swelling.  Gastrointestinal: Negative for abdominal pain, heartburn, nausea and vomiting.  Musculoskeletal: Negative for back pain, joint pain, myalgias and neck pain.  Skin: Positive for itching and rash.       'whelps' improving  Neurological: Negative for dizziness, tingling, sensory change, speech change, weakness and headaches.  Endo/Heme/Allergies: Bruises/bleeds easily.  Psychiatric/Behavioral: The patient is not nervous/anxious.      Current treatment- Promacta  Allergies  Allergen Reactions  . Codeine Anaphylaxis  . Fish-Derived Products Anaphylaxis  . Morphine Sulfate Anaphylaxis    REACTION: anaphylaxis  . Fish Allergy   . Morphine Sulfate     REACTION: anaphylaxis  . Oxycodone     Nausea and vomiting     Past Medical History:  Diagnosis Date  . Breast [place] ([place]    right, lumpectomy, radiation, chemo  . [place] ([place]    Right,  2007    . Breast cancer ([place]) 06/20/2016   INVASIVE DUCTAL CARCINOMA.  . Headache   . History of chemotherapy   . History of radiation therapy   . Hyperlipidemia   . Hypertension   . Osteoarthritis    right hip  . Osteoporosis   . Squamous cell carcinoma    leg, Followed by Dr. Nicole Kindred     Past Surgical History:  Procedure Laterality Date  . ABDOMINAL HYSTERECTOMY    . BREAST BIOPSY Left 2002   left breast, calcifications  . BREAST BIOPSY Left 06/20/2016   INVASIVE DUCTAL CARCINOMA.  Marland Kitchen BREAST EXCISIONAL BIOPSY Right 2007   positive  . bunion repair    . ESOPHAGOGASTRODUODENOSCOPY (EGD) WITH PROPOFOL N/A 08/05/2016   Procedure: ESOPHAGOGASTRODUODENOSCOPY (EGD) WITH PROPOFOL;  Surgeon: San Jetty, MD;  Location: [facility] ENDOSCOPY;  Service: General;  Laterality: N/A;  . left breast biopsy    . NASAL SINUS SURGERY    . right hip replacement    . SHOULDER SURGERY    . SQUAMOUS CELL CARCINOMA EXCISION     right leg, Dr. Nicole Kindred  . TOTAL HIP ARTHROPLASTY     right    Social History   Socioeconomic History  . Marital status: Married    Spouse name: Not on file  . Number of children: Not on file  . Years of education: Not on file  . Highest education level: Not on file  Social Needs  . Financial resource strain: Not on file  . Food insecurity - worry: Not on file  . Food insecurity - inability: Not on file  . Transportation needs - medical: Not on file  . Transportation needs - non-medical: Not on file  Occupational History  . Not on file  Tobacco Use  . Smoking status: Current Some Day Smoker    Packs/day: 0.10    Years: 30.00    Pack years: 3.00    Types: Cigarettes  . Smokeless tobacco: Never Used  Substance and Sexual Activity  . Alcohol use: Yes    Alcohol/week: 0.0 oz    Comment: socially  . Drug use: No  . Sexual activity: No  Other Topics Concern  . Not on file  Social History Narrative  . Not on file    Family History  Problem Relation Age of Onset   . Heart disease Father 47  . Heart disease Mother   . Hyperlipidemia Mother   . Heart disease Brother   . Diabetes Maternal Grandmother   . Breast cancer Cousin      Current Outpatient Medications:  .  ALPRAZolam (XANAX) 0.5 MG tablet, TAKE 1 TABLET BY MOUTH EVERY 8 HOURS AS NEEDED FOR ANXIETY OR SLEEP, Disp: 30 tablet, Rfl: 2 .  Cholecalciferol (VITAMIN D3) 1000 units CAPS, Take 1 capsule by mouth daily. , Disp: , Rfl:  .  eltrombopag (PROMACTA) 50 MG tablet, Take 1 tablet (50 mg total) by mouth daily. Take on an empty stomach 1 hour before a meal or 2 hours after (Patient not taking: Reported on 01/14/2017), Disp: 30 tablet, Rfl: 4 .  ergocalciferol (VITAMIN D2) 50000 units capsule, Take 1 capsule (50,000 Units total) by mouth once a week., Disp: 12 capsule, Rfl: 1 .  Fexofenadine HCl (ALLEGRA PO), Take 1 capsule by mouth., Disp: , Rfl:  .  glucosamine-chondroitin 500-400 MG tablet, Take 1 tablet by mouth 3 (three) times daily., Disp: , Rfl:  .  letrozole (FEMARA) 2.5 MG tablet, Take 1 tablet (2.5 mg total)  by mouth daily. Once a day., Disp: 90 tablet, Rfl: 3 .  Melatonin 10 MG TABS, Take 1 tablet by mouth at bedtime., Disp: , Rfl:  .  metoprolol succinate (TOPROL-XL) 25 MG 24 hr tablet, Take 1 tablet (25 mg total) by mouth daily., Disp: 90 tablet, Rfl: 3 .  pravastatin (PRAVACHOL) 40 MG tablet, Take 1 tablet (40 mg total) by mouth daily., Disp: 90 tablet, Rfl: 3 .  predniSONE (DELTASONE) 20 MG tablet, Take 2 tablets (40 mg total) by mouth daily with breakfast., Disp: 10 tablet, Rfl: 0 .  Probiotic Product (PROBIOTIC & ACIDOPHILUS EX ST PO), Take 1 tablet by mouth daily. , Disp: , Rfl:  No current facility-administered medications for this visit.   Facility-Administered Medications Ordered in Other Visits:  .  sodium chloride flush (NS) 0.9 % injection 10 mL, 10 mL, Intracatheter, PRN, Charlaine Dalton R, MD .  sodium chloride flush (NS) 0.9 % injection 3 mL, 3 mL, Intracatheter, PRN,  [name], MD  Physical exam:  Vitals:   01/29/17 1206  BP: (!) 146/76  Pulse: 79  Temp: (!) 96.1 F (35.6 C)  TempSrc: Tympanic   Physical Exam  Constitutional: She is oriented to person, place, and time and well-developed, well-nourished, and in no distress.  HENT:  Head: Normocephalic and atraumatic.  Mouth/Throat: Uvula is midline. No oropharyngeal exudate, posterior oropharyngeal edema or posterior oropharyngeal erythema.  Eyes: Pupils are equal, round, and reactive to light.  Neck: Normal range of motion. Neck supple.  Cardiovascular: Normal rate and regular rhythm.  Pulmonary/Chest: Effort normal and breath sounds normal. No respiratory distress. She has no wheezes.  Abdominal: Soft. There is no tenderness.  Musculoskeletal: She exhibits no deformity.  Neurological: She is alert and oriented to person, place, and time.  Skin: Skin is warm and dry. No ecchymosis, no petechiae and no purpura noted.  No obvious hives. Several areas of generalized redness consistent w/ pt scratching skin on arms, back, abdomen. Pt itching during visit. Skin flat. No purpura or petechiae. Evidence of bruises on legs.   Psychiatric: Mood, memory, affect and judgment normal.     CMP Latest Ref Rng & Units 01/14/2017  Glucose 65 - 99 mg/dL 91  BUN 6 - 20 mg/dL 11  Creatinine 0.44 - 1.00 mg/dL 0.66  Sodium 135 - 145 mmol/L 134(L)  Potassium 3.5 - 5.1 mmol/L 4.1  Chloride 101 - 111 mmol/L 102  CO2 22 - 32 mmol/L 25  Calcium 8.9 - 10.3 mg/dL 8.8(L)  Total Protein 6.5 - 8.1 g/dL 6.4(L)  Total Bilirubin 0.3 - 1.2 mg/dL 0.7  Alkaline Phos 38 - 126 U/L 70  AST 15 - 41 U/L 19  ALT 14 - 54 U/L 14   CBC Latest Ref Rng & Units 01/28/2017  WBC 3.6 - 11.0 K/uL 3.3(L)  Hemoglobin 12.0 - 16.0 g/dL 9.5(L)  Hematocrit 35.0 - 47.0 % 27.1(L)  Platelets 150 - 400 K/uL 16(LL)   Transfusion Reaction 01/29/17 12:59 Component 12:59  Post RXN DAT IgG NEG   DAT C3 NEG   Path interp tx rxn NO  LABORATORY EVIDENCE OF HEMOLYTIC TRANSFUSION REACTION PER DR Jenny Reichmann PATRICK 01/29/17 AT 1400.    No images are attached to the encounter.  No results found.   Assessment and plan- Patient is a 72 y.o. female with history severe thrombocytopenia and anemia who is referred to symptom management today for evaluation of concerns of possible reaction to platelet transfusion.  Initial Evaluation:  1. Transfusion Reaction-  symptoms improving. Reaction appears allergic. Patient stable appearing with benign, but bothersome complaints. No evidence of bronchospasm and symptoms are improving.  Vitals are stable. Will draw transfusion reaction labs and monitor at this time. If symptoms worsen or patient becomes unstable-consider IV fluids, Pepcid, Benadryl, and/or steroids.  Final Evaluation: 1. Transfusion reaction to platelets- Patient now observed for approximately 2 hours and requesting discharge. Transfusion reaction workup did not show evidence of hemolytic reaction. Vitals remain stable. No fever or hypotension. Cautioned patient's against delayed reactions with symptoms including but not limited to pain, fever, chest pain, dyspnea, sob, back pain, and jaundice, pallor, or discolored urine. Discussed the need for immediate medical assessment if symptoms appear. Will give 5 days of oral prednisone with daily Claritin or Benadryl OTC for itching. Patient to follow-up with Dr. Rogue Bussing in one week or sooner if patient requires additional transfusions.    Visit Diagnosis 1. Blood transfusion reaction, initial encounter     Patient expressed understanding and was in agreement with this plan. She also understands that She can call clinic at any time with any questions, concerns, or complaints. Please notify me if you've had no improvement in your symptoms, or symptoms become worse.   Beckey Rutter, DNP AGNP-C [facility] Pager- [place]-  [phone] 01/29/2017 3:42 PM  CC:  Dr. Rogue Bussing

## 2017-01-29 NOTE — OR Nursing (Signed)
Platelets infused,  VSSpt denies any s/s of reaction,states she  feels just fine.  IV discontinued and pt d/c home stable

## 2017-01-30 LAB — BPAM PLATELET PHERESIS
Blood Product Expiration Date: 201811212359
ISSUE DATE / TIME: 201811200816
Unit Type and Rh: 7300

## 2017-01-30 LAB — PREPARE PLATELET PHERESIS: UNIT DIVISION: 0

## 2017-01-30 LAB — TRANSFUSION REACTION
DAT C3: NEGATIVE
POST RXN DAT IGG: NEGATIVE

## 2017-02-04 ENCOUNTER — Encounter: Payer: Self-pay | Admitting: Nurse Practitioner

## 2017-02-04 ENCOUNTER — Inpatient Hospital Stay: Payer: Medicare Other

## 2017-02-04 ENCOUNTER — Other Ambulatory Visit: Payer: Self-pay

## 2017-02-04 ENCOUNTER — Inpatient Hospital Stay (HOSPITAL_BASED_OUTPATIENT_CLINIC_OR_DEPARTMENT_OTHER): Payer: Medicare Other | Admitting: Nurse Practitioner

## 2017-02-04 VITALS — BP 135/77 | HR 100 | Temp 97.8°F | Resp 20 | Ht 61.0 in | Wt 148.0 lb

## 2017-02-04 DIAGNOSIS — E785 Hyperlipidemia, unspecified: Secondary | ICD-10-CM

## 2017-02-04 DIAGNOSIS — Z79899 Other long term (current) drug therapy: Secondary | ICD-10-CM

## 2017-02-04 DIAGNOSIS — D693 Immune thrombocytopenic purpura: Secondary | ICD-10-CM

## 2017-02-04 DIAGNOSIS — D649 Anemia, unspecified: Secondary | ICD-10-CM

## 2017-02-04 DIAGNOSIS — Z170 Estrogen receptor positive status [ER+]: Secondary | ICD-10-CM

## 2017-02-04 DIAGNOSIS — E538 Deficiency of other specified B group vitamins: Secondary | ICD-10-CM

## 2017-02-04 DIAGNOSIS — C50911 Malignant neoplasm of unspecified site of right female breast: Secondary | ICD-10-CM

## 2017-02-04 DIAGNOSIS — T8092XD Unspecified transfusion reaction, subsequent encounter: Secondary | ICD-10-CM

## 2017-02-04 DIAGNOSIS — D469 Myelodysplastic syndrome, unspecified: Secondary | ICD-10-CM

## 2017-02-04 DIAGNOSIS — I10 Essential (primary) hypertension: Secondary | ICD-10-CM

## 2017-02-04 DIAGNOSIS — Z85828 Personal history of other malignant neoplasm of skin: Secondary | ICD-10-CM

## 2017-02-04 DIAGNOSIS — D696 Thrombocytopenia, unspecified: Secondary | ICD-10-CM | POA: Diagnosis not present

## 2017-02-04 DIAGNOSIS — F17210 Nicotine dependence, cigarettes, uncomplicated: Secondary | ICD-10-CM

## 2017-02-04 DIAGNOSIS — R5383 Other fatigue: Secondary | ICD-10-CM

## 2017-02-04 DIAGNOSIS — T8092XA Unspecified transfusion reaction, initial encounter: Secondary | ICD-10-CM

## 2017-02-04 DIAGNOSIS — C50811 Malignant neoplasm of overlapping sites of right female breast: Secondary | ICD-10-CM

## 2017-02-04 DIAGNOSIS — R0602 Shortness of breath: Secondary | ICD-10-CM

## 2017-02-04 DIAGNOSIS — M1990 Unspecified osteoarthritis, unspecified site: Secondary | ICD-10-CM

## 2017-02-04 DIAGNOSIS — Z79811 Long term (current) use of aromatase inhibitors: Secondary | ICD-10-CM

## 2017-02-04 DIAGNOSIS — Z923 Personal history of irradiation: Secondary | ICD-10-CM

## 2017-02-04 DIAGNOSIS — Y848 Other medical procedures as the cause of abnormal reaction of the patient, or of later complication, without mention of misadventure at the time of the procedure: Secondary | ICD-10-CM | POA: Diagnosis not present

## 2017-02-04 DIAGNOSIS — M810 Age-related osteoporosis without current pathological fracture: Secondary | ICD-10-CM

## 2017-02-04 DIAGNOSIS — Z9221 Personal history of antineoplastic chemotherapy: Secondary | ICD-10-CM

## 2017-02-04 DIAGNOSIS — Z803 Family history of malignant neoplasm of breast: Secondary | ICD-10-CM

## 2017-02-04 LAB — CBC WITH DIFFERENTIAL/PLATELET
BASOS ABS: 0 10*3/uL (ref 0–0.1)
Basophils Relative: 0 %
Eosinophils Absolute: 0 10*3/uL (ref 0–0.7)
Eosinophils Relative: 0 %
HCT: 25.6 % — ABNORMAL LOW (ref 35.0–47.0)
Hemoglobin: 9 g/dL — ABNORMAL LOW (ref 12.0–16.0)
LYMPHS PCT: 52 %
Lymphs Abs: 2.9 10*3/uL (ref 1.0–3.6)
MCH: 37.9 pg — ABNORMAL HIGH (ref 26.0–34.0)
MCHC: 35.2 g/dL (ref 32.0–36.0)
MCV: 107.8 fL — AB (ref 80.0–100.0)
MONO ABS: 0.4 10*3/uL (ref 0.2–0.9)
Monocytes Relative: 7 %
Neutro Abs: 2.3 10*3/uL (ref 1.4–6.5)
Neutrophils Relative %: 41 %
PLATELETS: 17 10*3/uL — AB (ref 150–400)
RBC: 2.38 MIL/uL — ABNORMAL LOW (ref 3.80–5.20)
RDW: 21.8 % — AB (ref 11.5–14.5)
WBC: 5.5 10*3/uL (ref 3.6–11.0)

## 2017-02-04 LAB — SAMPLE TO BLOOD BANK

## 2017-02-04 MED ORDER — CYANOCOBALAMIN 1000 MCG/ML IJ SOLN
1000.0000 ug | Freq: Once | INTRAMUSCULAR | Status: AC
  Administered 2017-02-04: 1000 ug via INTRAMUSCULAR
  Filled 2017-02-04: qty 1

## 2017-02-04 MED ORDER — ELTROMBOPAG OLAMINE 75 MG PO TABS: 75.0000 mg | ORAL_TABLET | Freq: Every day | ORAL | 2 refills | Status: DC

## 2017-02-04 NOTE — Progress Notes (Signed)
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  Most recently evaluated by primary oncologist, Dr. Rogue Bussing, 01/14/17. 10/19/16 imaging showed improved response of hilar and mediastinal adenopathy. Tolerating Femara well.  History of pancytopenia with more severe thrombocytopenia that is highly suspicious for MDS (s/p III opinion at [facility]) however extensive workup negative for obvious cause. She has taken N-plate weekly but response was suboptimal and patient was switched to Promacta 77m daily. History of platelet transfusion & reactions including hives, sob, and itching. Most recent transfusion 01/29/17 with hives & itching after transfusion. Additionally she has had severe anemia with transfusion of pRBCs on 01/22/17. [facility] [place]. Previously bone marrow biopsies have not shown clear diagnosis.   Interval history- Patient presents to symptom management clinic for follow-up after a platelet transfusion reaction on 01/29/17. Hives resolved spontaneously and patient was discharged with oral steroids for itching. Patient had received solu-medrol 434m benadryl 25 mg, and tylenol 65052ms pre-meds. Transfusion reaction blood work panel did not show evidence of hemolysis. Cultures were not drawn as patient was afebrile and gram  stain did not show organisms.   Today, patient reports that the day after she received transfusion, hives re-appeared, she was wheezing, and complained of back aches consistent with previous reactions. She initiated oral steroids as prescribed at that time and completed the medication yesterday. Symptoms have not reappeared. She has been on Promacta 52m37mr 2 weeks and Nplate is on hold. She is scheduled to [facility] 3 weeks. She reports feeling well. Denies dyspnea, wheezing, sob, rash, or back pain.   ECOG FS:0 - Asymptomatic  Review of systems- Review of Systems  Constitutional: Negative for chills, diaphoresis, fever and malaise/fatigue.  HENT: Negative for ear pain, nosebleeds and sore throat.   Eyes: Negative for blurred vision and double vision.  Respiratory: Negative for cough, hemoptysis, shortness of breath and wheezing.   Cardiovascular: Negative for chest pain, palpitations and leg swelling.  Gastrointestinal: Negative for abdominal pain, heartburn, nausea and vomiting.  Musculoskeletal: Negative for back pain, joint pain, myalgias and neck pain.  Skin: Negative for itching and rash.  Neurological: Negative for dizziness, tingling, sensory change, speech change, weakness and headaches.  Endo/Heme/Allergies: Bruises/bleeds easily.  Psychiatric/Behavioral: The patient is not nervous/anxious.    Current treatment- Promacta 52mg65mlergies  Allergen Reactions  . Codeine Anaphylaxis  . Fish-Derived Products Anaphylaxis  . Morphine Sulfate Anaphylaxis    REACTION: anaphylaxis  . Fish Allergy   . Morphine Sulfate     REACTION: anaphylaxis  . Oxycodone     Nausea and vomiting    Past Medical History:  Diagnosis Date  . Breast cancer (HCC) [place], lumpectomy, radiation, chemo  . Breast cancer ([place]CC) [place], 2007  . Breast cancer (HCC) [place]11/2018   INVASIVE DUCTAL CARCINOMA.  . Headache   .  History of chemotherapy   . History of  radiation therapy   . Hyperlipidemia   . Hypertension   . Osteoarthritis    right hip  . Osteoporosis   . 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 ABDOMINAL HYSTERECTOMY    . BREAST BIOPSY Left 2002   left breast, calcifications  . BREAST BIOPSY Left 06/20/2016   INVASIVE DUCTAL CARCINOMA.  Marland Kitchen BREAST EXCISIONAL BIOPSY Right 2007   positive  . bunion repair    . ESOPHAGOGASTRODUODENOSCOPY (EGD) WITH PROPOFOL N/A 08/05/2016   Procedure: ESOPHAGOGASTRODUODENOSCOPY (EGD) WITH PROPOFOL;  Surgeon: San Jetty, MD;  Location: [facility] ENDOSCOPY;  Service: General;  Laterality: N/A;  . left breast biopsy    . NASAL SINUS SURGERY    . right hip replacement    . SHOULDER SURGERY    . SQUAMOUS CELL CARCINOMA EXCISION     right leg, Dr. Nicole Kindred  . TOTAL HIP ARTHROPLASTY     right    Social History   Socioeconomic History  . Marital status: Married    Spouse name: Not on file  . Number of children: Not on file  . Years of education: Not on file  . Highest education level: Not on file  Social Needs  . Financial resource strain: Not on file  . Food insecurity - worry: Not on file  . Food insecurity - inability: Not on file  . Transportation needs - medical: Not on file  . Transportation needs - non-medical: Not on file  Occupational History  . Not on file  Tobacco Use  . Smoking status: Current Some Day Smoker    Packs/day: 0.10    Years: 30.00    Pack years: 3.00    Types: Cigarettes  . Smokeless tobacco: Never Used  Substance and Sexual Activity  . Alcohol use: Yes    Alcohol/week: 0.0 oz    Comment: socially  . Drug use: No  . Sexual activity: No  Other Topics Concern  . Not on file  Social History Narrative  . Not on file    Family History  Problem Relation Age of Onset  . Heart disease Father 67  . Heart disease Mother   . Hyperlipidemia Mother   . Heart disease Brother   . Diabetes  Maternal Grandmother   . Breast cancer Cousin     Current Outpatient Medications:  .  ALPRAZolam (XANAX) 0.5 MG tablet, TAKE 1 TABLET BY MOUTH EVERY 8 HOURS AS NEEDED FOR ANXIETY OR SLEEP, Disp: 30 tablet, Rfl: 2 .  Cholecalciferol (VITAMIN D3) 1000 units CAPS, Take 1 capsule by mouth daily. , Disp: , Rfl:  .  ergocalciferol (VITAMIN D2) 50000 units capsule, Take 1 capsule (50,000 Units total) by mouth once a week., Disp: 12 capsule, Rfl: 1 .  Fexofenadine HCl (ALLEGRA PO), Take 1 capsule by mouth., Disp: , Rfl:  .  glucosamine-chondroitin 500-400 MG tablet, Take 1 tablet by mouth 3 (three) times daily., Disp: , Rfl:  .  letrozole (FEMARA) 2.5 MG tablet, Take 1 tablet (2.5 mg total) by mouth daily. Once a day., Disp: 90 tablet, Rfl: 3 .  Melatonin 10 MG TABS, Take 1 tablet by mouth at bedtime., Disp: , Rfl:  .  metoprolol succinate (TOPROL-XL) 25 MG 24 hr tablet, Take 1 tablet (25 mg total) by mouth daily., Disp: 90 tablet, Rfl: 3 .  pravastatin (PRAVACHOL) 40 MG tablet, Take 1 tablet (  40 mg total) by mouth daily., Disp: 90 tablet, Rfl: 3 .  predniSONE (DELTASONE) 20 MG tablet, Take 2 tablets (40 mg total) by mouth daily with breakfast., Disp: 10 tablet, Rfl: 0 .  Probiotic Product (PROBIOTIC & ACIDOPHILUS EX ST PO), Take 1 tablet by mouth daily. , Disp: , Rfl:  .  eltrombopag (PROMACTA) 75 MG tablet, Take 1 tablet (75 mg total) by mouth daily. Take on an empty stomach 1 hour before meals or 2 hours after., Disp: 30 tablet, Rfl: 2  Physical exam:  Vitals:   02/04/17 1100 02/04/17 1112  BP: 135/77   Pulse: 100   Resp: 20   Temp: 97.8 F (36.6 C)   TempSrc: Tympanic   Weight:  148 lb (67.1 kg)  Height:  5' 1"  (1.549 m)   Physical Exam  Constitutional: She is oriented to person, place, and time and well-developed, well-nourished, and in no distress.  Pleasant, cheerful female. Accompanied by husband.  HENT:  Head: Normocephalic and atraumatic.  Mouth/Throat: Uvula is midline. No  oropharyngeal exudate, posterior oropharyngeal edema or posterior oropharyngeal erythema.  Eyes: Pupils are equal, round, and reactive to light.  Neck: Normal range of motion. Neck supple.  Cardiovascular: Normal rate and regular rhythm.  Pulmonary/Chest: Effort normal and breath sounds normal. No respiratory distress. She has no wheezes.  Abdominal: Soft. There is no tenderness.  Musculoskeletal: She exhibits no deformity.  Neurological: She is alert and oriented to person, place, and time.  Skin: Skin is warm and dry. No ecchymosis, no petechiae and no purpura noted.  No obvious hives. No purpura or petechiae. Evidence of bruises on legs.   Psychiatric: Mood, memory, affect and judgment normal.     CMP Latest Ref Rng & Units 01/14/2017  Glucose 65 - 99 mg/dL 91  BUN 6 - 20 mg/dL 11  Creatinine 0.44 - 1.00 mg/dL 0.66  Sodium 135 - 145 mmol/L 134(L)  Potassium 3.5 - 5.1 mmol/L 4.1  Chloride 101 - 111 mmol/L 102  CO2 22 - 32 mmol/L 25  Calcium 8.9 - 10.3 mg/dL 8.8(L)  Total Protein 6.5 - 8.1 g/dL 6.4(L)  Total Bilirubin 0.3 - 1.2 mg/dL 0.7  Alkaline Phos 38 - 126 U/L 70  AST 15 - 41 U/L 19  ALT 14 - 54 U/L 14   CBC Latest Ref Rng & Units 02/04/2017  WBC 3.6 - 11.0 K/uL 5.5  Hemoglobin 12.0 - 16.0 g/dL 9.0(L)  Hematocrit 35.0 - 47.0 % 25.6(L)  Platelets 150 - 400 K/uL 17(LL)   Transfusion Reaction 01/29/17 12:59 Component 12:59  Post RXN DAT IgG NEG   DAT C3 NEG   Path interp tx rxn NO LABORATORY EVIDENCE OF HEMOLYTIC TRANSFUSION REACTION PER DR Jenny Reichmann PATRICK 01/29/17 AT 1400.    No images are attached to the encounter.  No results found.   Assessment and plan- Patient is a 71 y.o. female with history severe thrombocytopenia and anemia presents to symptom management today for follow-up after platelet transfusion reaction.    1. Thrombocytopenia- etiology unknown. Awaiting fourth opinion at [facility] in 3 weeks. On Promacta 50 mg for 2 weeks. PLT- 17, tolerating well.  No abnormal bleeding/bruising. We will increase Promacta to 75 mg. Patient to start when she receives medication. Continue current dose until then.   2. Transfusion reaction- symptoms were consistent with allergic reaction. Symptoms now resolved & she completed oral steroids. Platelet count today is greater than 10 and appears stable over past several weeks. Patient is asymptomatic. With  high risk of transfusion reactions will avoid transfusion today with plan to transfuse if platelet count less than 10 or if symptomatic. Patient will continue to need premedication of Solu-Medrol and Benadryl along with monitoring for extended period posttransfusion. Discussed ER precautions.   3. Anemia- Hmg 9.0. Asymptomatic. Has not required transfusion for several weeks which she tolerated well. Awaiting consult from [facility]. Will continue to monitor closely. Anticipate transfusion if hemoglobin less than 8. Patient was receiving B12 monthly. Last dose early October. will repeat today.  B12 injection today. Return to clinic in 4 days for repeat lab work and evaluation.   Visit Diagnosis 1. Thrombocytopenia ([place])   2. B12 deficiency   3. Blood transfusion reaction, subsequent encounter     Patient expressed understanding and was in agreement with this plan. She also understands that She can call clinic at any time with any questions, concerns, or complaints. Please notify me if you've had no improvement in your symptoms, or symptoms become worse.   Beckey Rutter, DNP AGNP-C [facility] Pager- [place]- [phone] 02/04/2017 3:12 PM

## 2017-02-04 NOTE — Progress Notes (Signed)
Patient here to follow-up s/p IV platelet infusion reaction. She has no medical complaints today.  1114-Critical plt count called by Marc Morgans, Lab tech in cancer center. Read back process performed with lab tech and with MD (1118). Dr. Rogue Bussing also informed. plt count 17

## 2017-02-05 ENCOUNTER — Ambulatory Visit (INDEPENDENT_AMBULATORY_CARE_PROVIDER_SITE_OTHER): Payer: Medicare Other

## 2017-02-05 VITALS — BP 128/68 | HR 88 | Temp 98.5°F | Resp 15 | Ht 61.0 in | Wt 149.8 lb

## 2017-02-05 DIAGNOSIS — Z0000 Encounter for general adult medical examination without abnormal findings: Secondary | ICD-10-CM

## 2017-02-05 DIAGNOSIS — Z1331 Encounter for screening for depression: Secondary | ICD-10-CM | POA: Diagnosis not present

## 2017-02-05 NOTE — Progress Notes (Signed)
Subjective:   Shelly Arias is a 71 y.o. female who presents for Medicare Annual (Subsequent) preventive examination.  Review of Systems:  No ROS.  Medicare Wellness Visit. Additional risk factors are reflected in the social history.  Cardiac Risk Factors include: hypertension;advanced age (>41men, >22 women)     Objective:     Vitals: BP 128/68 (BP Location: Left Arm, Patient Position: Sitting, Cuff Size: Normal)   Pulse 88   Temp 98.5 F (36.9 C) (Oral)   Resp 15   Ht 5\' 1"  (1.549 m)   Wt 149 lb 12.8 oz (67.9 kg)   SpO2 97%   BMI 28.30 kg/m   Body mass index is 28.3 kg/m.   Tobacco Social History   Tobacco Use  Smoking Status Current Some Day Smoker  . Packs/day: 0.10  . Years: 30.00  . Pack years: 3.00  . Types: Cigarettes  Smokeless Tobacco Never Used     Ready to quit: No Counseling given: Not Answered   Past Medical History:  Diagnosis Date  . Breast cancer ([place])    right, lumpectomy, radiation, chemo  . Breast cancer ([place])    Right, 2007  . Breast cancer ([place]) 06/20/2016   INVASIVE DUCTAL CARCINOMA.  . Headache   . History of chemotherapy   . History of radiation therapy   . Hyperlipidemia   . Hypertension   . Osteoarthritis    right hip  . Osteoporosis   . Squamous cell carcinoma    leg, Followed by Dr. Nicole Kindred   Past Surgical History:  Procedure Laterality Date  . ABDOMINAL HYSTERECTOMY    . BREAST BIOPSY Left 2002   left breast, calcifications  . BREAST BIOPSY Left 06/20/2016   INVASIVE DUCTAL CARCINOMA.  Marland Kitchen BREAST EXCISIONAL BIOPSY Right 2007   positive  . bunion repair    . ESOPHAGOGASTRODUODENOSCOPY (EGD) WITH PROPOFOL N/A 08/05/2016   Procedure: ESOPHAGOGASTRODUODENOSCOPY (EGD) WITH PROPOFOL;  Surgeon: San Jetty, MD;  Location: [facility] ENDOSCOPY;  Service: General;  Laterality: N/A;  . left breast biopsy    . NASAL SINUS SURGERY    . right hip replacement    . SHOULDER SURGERY    . SQUAMOUS CELL CARCINOMA EXCISION     right leg, Dr. Nicole Kindred  . TOTAL HIP ARTHROPLASTY     right   Family History  Problem Relation Age of Onset  . Heart disease Father 4  . Heart disease Mother   . Hyperlipidemia Mother   . Heart disease Brother   . Diabetes Brother   . Diabetes Maternal Grandmother   . Breast cancer Cousin   . Kidney disease Brother    Social History   Substance and Sexual Activity  Sexual Activity No    Outpatient Encounter Medications as of 02/05/2017  Medication Sig  . ALPRAZolam (XANAX) 0.5 MG tablet TAKE 1 TABLET BY MOUTH EVERY 8 HOURS AS NEEDED FOR ANXIETY OR SLEEP  . eltrombopag (PROMACTA) 75 MG tablet Take 1 tablet (75 mg total) by mouth daily. Take on an empty stomach 1 hour before meals or 2 hours after.  . ergocalciferol (VITAMIN D2) 50000 units capsule Take 1 capsule (50,000 Units total) by mouth once a week.  Marland Kitchen Fexofenadine HCl (ALLEGRA PO) Take 1 capsule by mouth.  Marland Kitchen glucosamine-chondroitin 500-400 MG tablet Take 1 tablet by mouth daily.   Marland Kitchen letrozole (FEMARA) 2.5 MG tablet Take 1 tablet (2.5 mg total) by mouth daily. Once a day.  . metoprolol succinate (TOPROL-XL) 25 MG 24 hr tablet  Take 1 tablet (25 mg total) by mouth daily.  . pravastatin (PRAVACHOL) 40 MG tablet Take 1 tablet (40 mg total) by mouth daily.  . Probiotic Product (PROBIOTIC & ACIDOPHILUS EX ST PO) Take 1 tablet by mouth daily.   . [DISCONTINUED] Cholecalciferol (VITAMIN D3) 1000 units CAPS Take 1 capsule by mouth daily.   . [DISCONTINUED] Melatonin 10 MG TABS Take 1 tablet by mouth at bedtime.  . [DISCONTINUED] predniSONE (DELTASONE) 20 MG tablet Take 2 tablets (40 mg total) by mouth daily with breakfast.   No facility-administered encounter medications on file as of 02/05/2017.     Activities of Daily Living In your present state of health, do you have any difficulty performing the following activities: 02/05/2017 08/03/2016  Hearing? N N  Vision? N N  Difficulty concentrating or making decisions? Y N  Comment  Some difficulty focusing at times.  She works puzzles and reads for brain exercise.  -  Walking or climbing stairs? N N  Dressing or bathing? N N  Doing errands, shopping? N N  Preparing Food and eating ? N -  Using the Toilet? N -  In the past six months, have you accidently leaked urine? N -  Do you have problems with loss of bowel control? N -  Managing your Medications? N -  Managing your Finances? N -  Housekeeping or managing your Housekeeping? N -  Some recent data might be hidden    Patient Care Team: [name], MD as PCP - General (Family Medicine) [name], DPM as Consulting Physician (Podiatry) [name], MD as Consulting Physician (Internal Medicine) Bary Castilla Forest Gleason, MD (General Surgery)    Assessment:    This is a routine wellness examination for Shelly Arias. The goal of the wellness visit is to assist the patient how to close the gaps in care and create a preventative care plan for the patient.   The roster of all physicians providing medical care to patient is listed in the Snapshot section of the chart.  Osteoporosis reviewed.    Safety issues reviewed; Smoke and carbon monoxide detectors in the home. No firearms in the home.  Wears seatbelts when driving or riding with others. Patient does wear sunscreen or protective clothing when in direct sunlight. No violence in the home.  Depression- PHQ 2 &9 complete.  No signs/symptoms or verbal communication regarding little pleasure in doing things, feeling down, depressed or hopeless. No changes in sleeping, energy, eating, concentrating.  No thoughts of self harm or harm towards others.  Time spent on this topic is 8 minutes.   Patient is alert, normal appearance, oriented to person/place/and time. Correctly identified the president of the [place], recall of 2/3 words, and performing simple calculations. Displays appropriate judgement and can read correct time from watch face.   No new  identified risk were noted.  No failures at ADL's or IADL's.    BMI- discussed the importance of a healthy diet, water intake and the benefits of aerobic exercise. Educational material provided.   24 hour diet recall: Breakfast: honeybun Lunch: hotdog, french fries Dinner: soup  Daily fluid intake: 1 cups of caffeine,  4 cups of water  Dental-  Dr. Liliane Channel Daily  Eye- Visual acuity not assessed per patient preference.  Wears corrective lenses when reading.    Sleep patterns- Sleeps 7-8 hours at night.  Wakes feeling rested.  Health maintenance gaps- closed.  Patient Concerns: None at this time. Follow up with PCP as needed.  Exercise Activities and Dietary recommendations Current Exercise Habits: The patient does not participate in regular exercise at present  Goals    . Maintain Healthy Lifestyle     Healthy diet Stay hydrated Walk for exercise      Fall Risk Fall Risk  02/05/2017 01/22/2017 07/12/2016 02/17/2016 02/06/2016  Falls in the past year? No No No No No  Comment - - - Emmi Telephone Survey: data to providers prior to load -   Depression Screen PHQ 2/9 Scores 02/05/2017 02/06/2016 01/31/2015 11/19/2013  PHQ - 2 Score 0 0 0 0  PHQ- 9 Score 0 - - -     Cognitive Function MMSE - Mini Mental State Exam 01/31/2015  Orientation to time 5  Orientation to Place 5  Registration 3  Attention/ Calculation 5  Recall 3  Language- name 2 objects 2  Language- repeat 1  Language- follow 3 step command 3  Language- read & follow direction 1  Write a sentence 1  Copy design 1  Total score 30     6CIT Screen 02/05/2017 02/06/2016  What Year? 0 points 0 points  What month? 0 points 0 points  What time? 0 points 0 points  Count back from 20 0 points 0 points  Months in reverse 0 points 0 points  Repeat phrase 0 points 0 points  Total Score 0 0    Immunization History  Administered Date(s) Administered  . Influenza Split 12/12/2010, 01/01/2012  . Influenza  Whole 01/14/2007, 12/07/2008  . Influenza,inj,Quad PF,6+ Mos 12/25/2012, 11/19/2013, 12/17/2016  . Influenza-Unspecified 01/07/2015, 11/29/2015  . Pneumococcal Conjugate-13 11/19/2013  . Pneumococcal Polysaccharide-23 11/06/2012  . Td 08/24/2002  . Tdap 11/06/2012   Screening Tests Health Maintenance  Topic Date Due  . MAMMOGRAM  10/18/2017  . COLONOSCOPY  02/12/2018  . TETANUS/TDAP  11/07/2022  . INFLUENZA VACCINE  Completed  . DEXA SCAN  Completed  . Hepatitis C Screening  Completed  . PNA vac Low Risk Adult  Completed      Plan:    End of life planning; Advance aging; Advanced directives discussed. Copy of current HCPOA/Living Will on file.    I have personally reviewed and noted the following in the patient's chart:   . Medical and social history . Use of alcohol, tobacco or illicit drugs  . Current medications and supplements . Functional ability and status . Nutritional status . Physical activity . Advanced directives . List of other physicians . Hospitalizations, surgeries, and ER visits in previous 12 months . Vitals . Screenings to include cognitive, depression, and falls . Referrals and appointments  In addition, I have reviewed and discussed with patient certain preventive protocols, quality metrics, and best practice recommendations. A written personalized care plan for preventive services as well as general preventive health recommendations were provided to patient.     Varney Biles, LPN  12/16/1217

## 2017-02-05 NOTE — Patient Instructions (Addendum)
  Shelly Arias , Thank you for taking time to come for your Medicare Wellness Visit. I appreciate your ongoing commitment to your health goals. Please review the following plan we discussed and let me know if I can assist you in the future.   Follow up with Dr. Caryl Bis as needed.    Have a great day!  These are the goals we discussed: Goals    . Maintain Healthy Lifestyle     Healthy diet Stay hydrated Walk for exercise       This is a list of the screening recommended for you and due dates:  Health Maintenance  Topic Date Due  . Mammogram  10/18/2017  . Colon Cancer Screening  02/12/2018  . Tetanus Vaccine  11/07/2022  . Flu Shot  Completed  . DEXA scan (bone density measurement)  Completed  .  Hepatitis C: One time screening is recommended by Center for Disease Control  (CDC) for  adults born from [birth year] through 1963.   Completed  . Pneumonia vaccines  Completed

## 2017-02-08 ENCOUNTER — Inpatient Hospital Stay (HOSPITAL_BASED_OUTPATIENT_CLINIC_OR_DEPARTMENT_OTHER): Payer: Medicare Other | Admitting: Nurse Practitioner

## 2017-02-08 ENCOUNTER — Encounter: Payer: Self-pay | Admitting: Nurse Practitioner

## 2017-02-08 ENCOUNTER — Other Ambulatory Visit: Payer: Self-pay | Admitting: *Deleted

## 2017-02-08 ENCOUNTER — Inpatient Hospital Stay: Payer: Medicare Other

## 2017-02-08 ENCOUNTER — Other Ambulatory Visit: Payer: Self-pay

## 2017-02-08 ENCOUNTER — Telehealth: Payer: Self-pay | Admitting: Pharmacist

## 2017-02-08 VITALS — BP 125/78 | HR 89 | Temp 97.2°F | Resp 18 | Ht 61.0 in | Wt 149.0 lb

## 2017-02-08 DIAGNOSIS — Z85828 Personal history of other malignant neoplasm of skin: Secondary | ICD-10-CM

## 2017-02-08 DIAGNOSIS — R0602 Shortness of breath: Secondary | ICD-10-CM | POA: Diagnosis not present

## 2017-02-08 DIAGNOSIS — D649 Anemia, unspecified: Secondary | ICD-10-CM | POA: Diagnosis not present

## 2017-02-08 DIAGNOSIS — E785 Hyperlipidemia, unspecified: Secondary | ICD-10-CM

## 2017-02-08 DIAGNOSIS — L578 Other skin changes due to chronic exposure to nonionizing radiation: Secondary | ICD-10-CM | POA: Diagnosis not present

## 2017-02-08 DIAGNOSIS — Z79899 Other long term (current) drug therapy: Secondary | ICD-10-CM

## 2017-02-08 DIAGNOSIS — R5383 Other fatigue: Secondary | ICD-10-CM

## 2017-02-08 DIAGNOSIS — L820 Inflamed seborrheic keratosis: Secondary | ICD-10-CM | POA: Diagnosis not present

## 2017-02-08 DIAGNOSIS — T8092XA Unspecified transfusion reaction, initial encounter: Secondary | ICD-10-CM

## 2017-02-08 DIAGNOSIS — C50911 Malignant neoplasm of unspecified site of right female breast: Secondary | ICD-10-CM

## 2017-02-08 DIAGNOSIS — D696 Thrombocytopenia, unspecified: Secondary | ICD-10-CM

## 2017-02-08 DIAGNOSIS — Z803 Family history of malignant neoplasm of breast: Secondary | ICD-10-CM

## 2017-02-08 DIAGNOSIS — M1990 Unspecified osteoarthritis, unspecified site: Secondary | ICD-10-CM

## 2017-02-08 DIAGNOSIS — Z923 Personal history of irradiation: Secondary | ICD-10-CM | POA: Diagnosis not present

## 2017-02-08 DIAGNOSIS — Y848 Other medical procedures as the cause of abnormal reaction of the patient, or of later complication, without mention of misadventure at the time of the procedure: Secondary | ICD-10-CM

## 2017-02-08 DIAGNOSIS — Z170 Estrogen receptor positive status [ER+]: Secondary | ICD-10-CM

## 2017-02-08 DIAGNOSIS — Z9221 Personal history of antineoplastic chemotherapy: Secondary | ICD-10-CM

## 2017-02-08 DIAGNOSIS — L821 Other seborrheic keratosis: Secondary | ICD-10-CM | POA: Diagnosis not present

## 2017-02-08 DIAGNOSIS — I10 Essential (primary) hypertension: Secondary | ICD-10-CM

## 2017-02-08 DIAGNOSIS — M810 Age-related osteoporosis without current pathological fracture: Secondary | ICD-10-CM

## 2017-02-08 DIAGNOSIS — Z79811 Long term (current) use of aromatase inhibitors: Secondary | ICD-10-CM

## 2017-02-08 DIAGNOSIS — F17210 Nicotine dependence, cigarettes, uncomplicated: Secondary | ICD-10-CM

## 2017-02-08 DIAGNOSIS — Z87898 Personal history of other specified conditions: Secondary | ICD-10-CM | POA: Insufficient documentation

## 2017-02-08 LAB — CBC WITH DIFFERENTIAL/PLATELET
BASOS ABS: 0 10*3/uL (ref 0–0.1)
Basophils Relative: 0 %
Eosinophils Absolute: 0 10*3/uL (ref 0–0.7)
Eosinophils Relative: 1 %
HEMATOCRIT: 24.3 % — AB (ref 35.0–47.0)
HEMOGLOBIN: 8.6 g/dL — AB (ref 12.0–16.0)
LYMPHS PCT: 39 %
Lymphs Abs: 1.1 10*3/uL (ref 1.0–3.6)
MCH: 38.1 pg — ABNORMAL HIGH (ref 26.0–34.0)
MCHC: 35.2 g/dL (ref 32.0–36.0)
MCV: 108.3 fL — AB (ref 80.0–100.0)
MONO ABS: 0.2 10*3/uL (ref 0.2–0.9)
MONOS PCT: 7 %
NEUTROS ABS: 1.5 10*3/uL (ref 1.4–6.5)
NEUTROS PCT: 53 %
Platelets: 13 10*3/uL — CL (ref 150–400)
RBC: 2.25 MIL/uL — ABNORMAL LOW (ref 3.80–5.20)
RDW: 22.4 % — AB (ref 11.5–14.5)
WBC: 2.8 10*3/uL — ABNORMAL LOW (ref 3.6–11.0)

## 2017-02-08 LAB — COMPREHENSIVE METABOLIC PANEL
ALT: 12 U/L — ABNORMAL LOW (ref 14–54)
ANION GAP: 7 (ref 5–15)
AST: 18 U/L (ref 15–41)
Albumin: 3.8 g/dL (ref 3.5–5.0)
Alkaline Phosphatase: 66 U/L (ref 38–126)
BUN: 13 mg/dL (ref 6–20)
CO2: 26 mmol/L (ref 22–32)
Calcium: 8.7 mg/dL — ABNORMAL LOW (ref 8.9–10.3)
Chloride: 101 mmol/L (ref 101–111)
Creatinine, Ser: 0.77 mg/dL (ref 0.44–1.00)
GFR calc non Af Amer: >60 mL/min (ref >60)
Glucose, Bld: 100 mg/dL — ABNORMAL HIGH (ref 65–99)
POTASSIUM: 4.3 mmol/L (ref 3.5–5.1)
SODIUM: 134 mmol/L — AB (ref 135–145)
TOTAL PROTEIN: 6.2 g/dL — AB (ref 6.5–8.1)
Total Bilirubin: 0.6 mg/dL (ref 0.3–1.2)

## 2017-02-08 MED ORDER — SODIUM CHLORIDE 0.9% FLUSH
10.0000 mL | INTRAVENOUS | Status: AC | PRN
  Administered 2017-02-08: 10 mL
  Filled 2017-02-08: qty 10

## 2017-02-08 MED ORDER — ACETAMINOPHEN 325 MG PO TABS
650.0000 mg | ORAL_TABLET | Freq: Once | ORAL | Status: AC
  Administered 2017-02-08: 650 mg via ORAL
  Filled 2017-02-08: qty 2

## 2017-02-08 MED ORDER — SODIUM CHLORIDE 0.9 % IV SOLN
250.0000 mL | Freq: Once | INTRAVENOUS | Status: AC
  Administered 2017-02-08: 250 mL via INTRAVENOUS
  Filled 2017-02-08: qty 250

## 2017-02-08 MED ORDER — PREDNISONE 20 MG PO TABS: 40.0000 mg | ORAL_TABLET | Freq: Every day | ORAL | 0 refills | Status: DC

## 2017-02-08 MED ORDER — METHYLPREDNISOLONE SODIUM SUCC 125 MG IJ SOLR
60.0000 mg | Freq: Once | INTRAMUSCULAR | Status: AC
  Administered 2017-02-08: 60 mg via INTRAVENOUS
  Filled 2017-02-08: qty 2

## 2017-02-08 MED ORDER — DIPHENHYDRAMINE HCL 25 MG PO CAPS
50.0000 mg | ORAL_CAPSULE | Freq: Once | ORAL | Status: AC
  Administered 2017-02-08: 50 mg via ORAL
  Filled 2017-02-08: qty 2

## 2017-02-08 NOTE — Telephone Encounter (Signed)
Oral Chemotherapy Pharmacist Encounter  Called to check on status of her Promacta prescription. For some reason that have not reached out to her to schedule delivery of her increased dose Promacta.  Initated fill for Promacta 75mg . Prescription will [facility] and [facility] [address], [place], [place] [address] tomorrow 12/1.  Darl Pikes, PharmD, BCPS Hematology/Oncology Clinical Pharmacist [facility] [phone]  02/08/2017 4:28 PM

## 2017-02-08 NOTE — Progress Notes (Signed)
15 Post transfusion vitals-patient experienced legs cramps bilateral groin. Pt stated that she "I have these leg cramps at bedtime." pt repositioned and leg cramps improved.

## 2017-02-08 NOTE — Progress Notes (Signed)
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  Most recently evaluated by primary oncologist, Dr. Rogue Bussing, 01/14/17. 10/19/16 imaging showed improved response of hilar and mediastinal adenopathy. Tolerating Femara well.   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 Unknown etiology despite extensive workup. S/p- nplate, IVIG, steroids. Has required several platelet transfusions. Unfortunately, patient has suffered allergic type reactions with symptoms including hives, shortness of breath, and pruritus. Most recent transfusion on 01/29/17 or patient suffered hives and itching approximately 30 minutes after transfusion was complete and resolved spontaneously then reappeared 24 hours after transfusion. Patient was given oral steroids controlled symptoms. Blood work was drawn after transfusion which did not show evidence of hemolysis. Historically, patient has received Solu-Medrol 40 mg, Benadryl 25 mg, and Tylenol 650 mg as premedication.  Additionally, patient has had episodes of severe anemia and required transfusion of PRBCs. Most recent transfusion on 01/22/17. She receives vitamin B12 injections monthly. Last injection  02/04/17.   Interval history- very pleasant 71 year old female patient presents to symptom management clinic for follow-up for severe thrombocytopenia. Currently on Promacta 50 mg. Dose was increased to 75 mg at last visit. New dosage to start tomorrow due to shipping of medication. Tolerating well. Today, patient reports feeling well and is disappointed low platelet count. Denies fatigue, SOB, fever, chills, or malaise. Denies petechiae, bleeding of gums, nosebleeds, bruising, or other bleeding episodes.   ECOG FS:0 - Asymptomatic  Review of systems- Review of Systems  Constitutional: Negative for chills, diaphoresis, fever and malaise/fatigue.  HENT: Negative for ear pain, nosebleeds and sore throat.   Eyes: Negative for blurred vision and double vision.  Respiratory: Negative for cough, hemoptysis, shortness of breath and wheezing.   Cardiovascular: Negative for chest pain, palpitations and leg swelling.  Gastrointestinal: Negative for abdominal pain, blood in stool, heartburn, nausea and vomiting.  Genitourinary: Negative for flank pain.  Musculoskeletal: Negative for back pain, joint pain, myalgias and neck pain.  Skin: Negative for itching and rash.  Neurological: Negative for dizziness, tingling, sensory change, speech change, weakness and headaches.  Endo/Heme/Allergies: Bruises/bleeds easily.  Psychiatric/Behavioral: The patient is not nervous/anxious.    Current treatment- Promacta 50mg  (starts Promacta 75mg  02/09/17)  Allergies  Allergen Reactions  . Codeine Anaphylaxis  . Fish-Derived Products Anaphylaxis  . Morphine Sulfate Anaphylaxis    REACTION: anaphylaxis  . Fish Allergy   . Morphine Sulfate     REACTION: anaphylaxis  . Oxycodone     Nausea and vomiting    Past Medical History:  Diagnosis Date  . Breast cancer ([place])    right, lumpectomy, radiation, chemo  . Breast cancer ([place])    Right, 2007  . Breast cancer ([place]) 06/20/2016   INVASIVE DUCTAL CARCINOMA.    . Headache   . History of chemotherapy   . History of radiation therapy   .  Hyperlipidemia   . Hypertension   . Osteoarthritis    right hip  . Osteoporosis   . 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 ABDOMINAL HYSTERECTOMY    . BREAST BIOPSY Left 2002   left breast, calcifications  . BREAST BIOPSY Left 06/20/2016   INVASIVE DUCTAL CARCINOMA.  Marland Kitchen BREAST EXCISIONAL BIOPSY Right 2007   positive  . bunion repair    . ESOPHAGOGASTRODUODENOSCOPY (EGD) WITH PROPOFOL N/A 08/05/2016   Procedure: ESOPHAGOGASTRODUODENOSCOPY (EGD) WITH PROPOFOL;  Surgeon: San Jetty, MD;  Location: [facility] ENDOSCOPY;  Service: General;  Laterality: N/A;  . left breast biopsy    . NASAL SINUS SURGERY    . right hip replacement    . SHOULDER SURGERY    . SQUAMOUS CELL CARCINOMA EXCISION     right leg, Dr. Nicole Kindred  . TOTAL HIP ARTHROPLASTY     right    Social History   Socioeconomic History  . Marital status: Married    Spouse name: Not on file  . Number of children: Not on file  . Years of education: Not on file  . Highest education level: Not on file  Social Needs  . Financial resource strain: Not hard at all  . Food insecurity - worry: Not on file  . Food insecurity - inability: Not on file  . Transportation needs - medical: No  . Transportation needs - non-medical: No  Occupational History  . Not on file  Tobacco Use  . Smoking status: Current Some Day Smoker    Packs/day: 0.10    Years: 30.00    Pack years: 3.00    Types: Cigarettes  . Smokeless tobacco: Never Used  Substance and Sexual Activity  . Alcohol use: Yes    Alcohol/week: 0.0 oz    Comment: socially  . Drug use: No  . Sexual activity: No  Other Topics Concern  . Not on file  Social History Narrative  . Not on file    Family History  Problem Relation Age of Onset  . Heart disease Father 93  . Heart disease Mother   . Hyperlipidemia Mother    . Heart disease Brother   . Diabetes Brother   . Diabetes Maternal Grandmother   . Breast cancer Cousin   . Kidney disease Brother     Current Outpatient Medications:  .  ALPRAZolam (XANAX) 0.5 MG tablet, TAKE 1 TABLET BY MOUTH EVERY 8 HOURS AS NEEDED FOR ANXIETY OR SLEEP, Disp: 30 tablet, Rfl: 2 .  eltrombopag (PROMACTA) 75 MG tablet, Take 1 tablet (75 mg total) by mouth daily. Take on an empty stomach 1 hour before meals or 2 hours after., Disp: 30 tablet, Rfl: 2 .  ergocalciferol (VITAMIN D2) 50000 units capsule, Take 1 capsule (50,000 Units total) by mouth once a week., Disp: 12 capsule, Rfl: 1 .  Fexofenadine HCl (ALLEGRA PO), Take 1 capsule by mouth., Disp: , Rfl:  .  glucosamine-chondroitin 500-400 MG tablet, Take 1 tablet by mouth daily. , Disp: , Rfl:  .  letrozole (FEMARA) 2.5 MG tablet, Take 1 tablet (2.5 mg total) by mouth daily. Once a day., Disp: 90 tablet, Rfl: 3 .  metoprolol succinate (TOPROL-XL) 25 MG 24 hr tablet, Take 1 tablet (25 mg total) by mouth daily., Disp: 90 tablet, Rfl: 3 .  pravastatin (PRAVACHOL) 40 MG tablet, Take 1 tablet (40 mg total) by mouth daily., Disp: 90 tablet, Rfl:  3 .  Probiotic Product (PROBIOTIC & ACIDOPHILUS EX ST PO), Take 1 tablet by mouth daily. , Disp: , Rfl:   Physical exam:  Vitals:   02/08/17 0900  BP: 125/78  Pulse: 89  Resp: 18  Temp: (!) 97.2 F (36.2 C)  TempSrc: Tympanic  Weight: 149 lb (67.6 kg)  Height: 5\' 1"  (1.549 m)   Physical Exam  Constitutional: She is oriented to person, place, and time and well-developed, well-nourished, and in no distress.  Pleasant, cheerful female. Accompanied by husband.  HENT:  Head: Normocephalic and atraumatic.  Mouth/Throat: Uvula is midline. No oropharyngeal exudate, posterior oropharyngeal edema or posterior oropharyngeal erythema.  Eyes: Pupils are equal, round, and reactive to light.  Neck: Normal range of motion. Neck supple.  Cardiovascular: Normal rate and regular rhythm.   Pulmonary/Chest: Effort normal and breath sounds normal. No respiratory distress. She has no wheezes.  Abdominal: Soft. There is no tenderness.  Musculoskeletal: She exhibits no deformity.  Neurological: She is alert and oriented to person, place, and time.  Skin: Skin is warm and dry. No ecchymosis, no petechiae and no purpura noted.  No obvious hives. No purpura or petechiae. Evidence of bruises on legs.   Psychiatric: Mood, memory, affect and judgment normal.     CMP Latest Ref Rng & Units 02/08/2017  Glucose 65 - 99 mg/dL 100(H)  BUN 6 - 20 mg/dL 13  Creatinine 0.44 - 1.00 mg/dL 0.77  Sodium 135 - 145 mmol/L 134(L)  Potassium 3.5 - 5.1 mmol/L 4.3  Chloride 101 - 111 mmol/L 101  CO2 22 - 32 mmol/L 26  Calcium 8.9 - 10.3 mg/dL 8.7(L)  Total Protein 6.5 - 8.1 g/dL 6.2(L)  Total Bilirubin 0.3 - 1.2 mg/dL 0.6  Alkaline Phos 38 - 126 U/L 66  AST 15 - 41 U/L 18  ALT 14 - 54 U/L 12(L)   CBC Latest Ref Rng & Units 02/08/2017  WBC 3.6 - 11.0 K/uL 2.8(L)  Hemoglobin 12.0 - 16.0 g/dL 8.6(L)  Hematocrit 35.0 - 47.0 % 24.3(L)  Platelets 150 - 400 K/uL 13(LL)   Transfusion Reaction 01/29/17 12:59 Component 12:59  Post RXN DAT IgG NEG   DAT C3 NEG   Path interp tx rxn NO LABORATORY EVIDENCE OF HEMOLYTIC TRANSFUSION REACTION PER DR Jenny Reichmann PATRICK 01/29/17 AT 1400.    No images are attached to the encounter.  No results found.   Assessment and plan- Patient is a 71 y.o. female with history severe thrombocytopenia and anemia presents to symptom management today for follow-up and repeat labwork.    Initial Evaluation-  1. Thrombocytopenia- etiology unknown. PLT count today 13 compared to 17 (02/04/17). Currently no bleeding. [place] appointment. Awaiting increased dose of Promacta to arrive from [place]. Discussed with Dr. Rogue Bussing who recommended platelet transfusion. Discussed risk for allergic reactions/anaphylaxis with transfusions with patient who  wishes to proceed. Given her history, will give solu-medrol 60mg , tylenol 650 mg, and benadryl 50 mg. Will also plan for patient to keep IV access and stay for monitoring for 1 hour after transfusion is complete. Patient agrees with this plan. Will speak to [employer] (pharmacy) to check on status of Promacta.   2. Anemia- Hemoglobin 8.6 compared to 9.0 (02/04/17). Asymptomatic. Suspect that she will require transfusion in next 1-2 weeks. Will repeat labs at next appointment.    Final Evaluation-  1. Thrombocytopenia-  1 unit platelets transfused without complications. Patient did complain of leg cramps during infusion and nursing gave her mustard.  Patient stated she does consume tonic water for leg cramps. Unlikely given quantity that she reports drinking this could be related to thrombocytopenia. Patient currently stable. Now >1 hour from completion of transfusion. Advised patient and her spouse to call MD on call and go to ER if symptoms of reaction develop. Patient requests I provide patient with prescription for prednisone similar to last visit. Discussed that if she develops signs/symptoms of reaction, she is to contact MD on call for approval prior to initiating medication.  2. Anemia- No transfusion of pRBCs today. Patient has appointment to see dr. Rogue Bussing 02/11/17 for repeat labs and re-evaluation with infusion appointment on 02/12/17. Anticipate that she may need additional platelets and/or blood at that time.    Visit Diagnosis 1. Thrombocytopenia ([place])   2. History of blood transfusion reaction   3. Anemia, unspecified type     Patient expressed understanding and was in agreement with this plan. She also understands that She can call clinic at any time with any questions, concerns, or complaints. Please notify me if you've had no improvement in your symptoms, or symptoms become worse.  A total of (30) minutes of face-to-face time was spent with this patient with greater than 50% of that  time in counseling and care-coordination including but not limited to: discussion of risks and benefits of platelet transfusion, coordination of transfusion, discussions of management with med-onc, and counseling regarding signs and symptoms of transfusion reaction.    Beckey Rutter, DNP AGNP-C [facility] Pager- [place]- [phone] 02/08/2017 4:14 PM

## 2017-02-08 NOTE — Progress Notes (Signed)
[id number] am- left msg with Debbie, RN in SDS-to determine if pt can be added for I unit of plts. Spoke with blood bank-may take approximately an hr to prepare plt component. Pt has another md apt with with an hour and would prefer to come back by 1230 or 1 pm to receive plt component

## 2017-02-09 LAB — PREPARE PLATELET PHERESIS: UNIT DIVISION: 0

## 2017-02-09 LAB — BPAM PLATELET PHERESIS
BLOOD PRODUCT EXPIRATION DATE: 201812022359
ISSUE DATE / TIME: 201811301443
Unit Type and Rh: 5100

## 2017-02-11 ENCOUNTER — Inpatient Hospital Stay: Payer: Medicare Other | Attending: Internal Medicine

## 2017-02-11 ENCOUNTER — Other Ambulatory Visit: Payer: Self-pay | Admitting: *Deleted

## 2017-02-11 ENCOUNTER — Inpatient Hospital Stay (HOSPITAL_BASED_OUTPATIENT_CLINIC_OR_DEPARTMENT_OTHER): Payer: Medicare Other | Admitting: Internal Medicine

## 2017-02-11 ENCOUNTER — Inpatient Hospital Stay: Payer: Medicare Other

## 2017-02-11 VITALS — BP 100/74 | HR 98 | Temp 97.4°F | Resp 16 | Wt 145.0 lb

## 2017-02-11 DIAGNOSIS — M810 Age-related osteoporosis without current pathological fracture: Secondary | ICD-10-CM | POA: Diagnosis not present

## 2017-02-11 DIAGNOSIS — D696 Thrombocytopenia, unspecified: Secondary | ICD-10-CM | POA: Insufficient documentation

## 2017-02-11 DIAGNOSIS — M1990 Unspecified osteoarthritis, unspecified site: Secondary | ICD-10-CM | POA: Diagnosis not present

## 2017-02-11 DIAGNOSIS — Z79899 Other long term (current) drug therapy: Secondary | ICD-10-CM | POA: Diagnosis not present

## 2017-02-11 DIAGNOSIS — F17210 Nicotine dependence, cigarettes, uncomplicated: Secondary | ICD-10-CM | POA: Insufficient documentation

## 2017-02-11 DIAGNOSIS — Z803 Family history of malignant neoplasm of breast: Secondary | ICD-10-CM | POA: Insufficient documentation

## 2017-02-11 DIAGNOSIS — Z170 Estrogen receptor positive status [ER+]: Secondary | ICD-10-CM | POA: Insufficient documentation

## 2017-02-11 DIAGNOSIS — D469 Myelodysplastic syndrome, unspecified: Secondary | ICD-10-CM | POA: Diagnosis not present

## 2017-02-11 DIAGNOSIS — R21 Rash and other nonspecific skin eruption: Secondary | ICD-10-CM | POA: Diagnosis not present

## 2017-02-11 DIAGNOSIS — Z79811 Long term (current) use of aromatase inhibitors: Secondary | ICD-10-CM | POA: Insufficient documentation

## 2017-02-11 DIAGNOSIS — J189 Pneumonia, unspecified organism: Secondary | ICD-10-CM | POA: Insufficient documentation

## 2017-02-11 DIAGNOSIS — R000 Tachycardia, unspecified: Secondary | ICD-10-CM | POA: Insufficient documentation

## 2017-02-11 DIAGNOSIS — D61818 Other pancytopenia: Secondary | ICD-10-CM

## 2017-02-11 DIAGNOSIS — C50811 Malignant neoplasm of overlapping sites of right female breast: Secondary | ICD-10-CM | POA: Insufficient documentation

## 2017-02-11 DIAGNOSIS — D649 Anemia, unspecified: Secondary | ICD-10-CM | POA: Diagnosis not present

## 2017-02-11 DIAGNOSIS — Z853 Personal history of malignant neoplasm of breast: Secondary | ICD-10-CM

## 2017-02-11 DIAGNOSIS — I10 Essential (primary) hypertension: Secondary | ICD-10-CM | POA: Insufficient documentation

## 2017-02-11 DIAGNOSIS — Z923 Personal history of irradiation: Secondary | ICD-10-CM

## 2017-02-11 DIAGNOSIS — E785 Hyperlipidemia, unspecified: Secondary | ICD-10-CM

## 2017-02-11 LAB — CBC WITH DIFFERENTIAL/PLATELET
BASOS ABS: 0 10*3/uL (ref 0–0.1)
BASOS PCT: 0 %
EOS ABS: 0 10*3/uL (ref 0–0.7)
Eosinophils Relative: 1 %
HEMATOCRIT: 23.9 % — AB (ref 35.0–47.0)
HEMOGLOBIN: 8.5 g/dL — AB (ref 12.0–16.0)
Lymphocytes Relative: 43 %
Lymphs Abs: 1.6 10*3/uL (ref 1.0–3.6)
MCH: 38.5 pg — ABNORMAL HIGH (ref 26.0–34.0)
MCHC: 35.7 g/dL (ref 32.0–36.0)
MCV: 108 fL — ABNORMAL HIGH (ref 80.0–100.0)
Monocytes Absolute: 0.3 10*3/uL (ref 0.2–0.9)
Monocytes Relative: 8 %
NEUTROS ABS: 1.9 10*3/uL (ref 1.4–6.5)
NEUTROS PCT: 48 %
Platelets: 15 10*3/uL — CL (ref 150–400)
RBC: 2.22 MIL/uL — AB (ref 3.80–5.20)
RDW: 21.8 % — ABNORMAL HIGH (ref 11.5–14.5)
WBC: 3.9 10*3/uL (ref 3.6–11.0)

## 2017-02-11 LAB — SAMPLE TO BLOOD BANK

## 2017-02-11 NOTE — Assessment & Plan Note (Addendum)
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 On Femara.  AUG 10th - improved response- improvement of the hilar/mediastinal adenopathy. Tolerating Femara well without any major side effects. No clinical progression noted.   # Severe thrombocytopenia//anemia- highly suspicious for MDS [s/p III opinion at [facility]] however extensive workup negative for any obvious cause of her severe thrombocytopenia.  Patient currently on Promacta 75 mg a day [increase the dose 2 days ago]. patient's platelets 15  Today- s/p platelets 3 days ago. Awaiting appt at [facility]- on 12/06.  Given upcoming travel-2 DC recommend platelet transfusion tomorrow.  #Platelet transfusion reaction-status post steroids premedication improved.    # Severe anemia Anemia- Hb 8.1 s/p transfusion [approximately 1 month ago]. [facility]waiting [facility] this week.  # Arthralgais- secondary to AI-improved. Recommend high-dose VitD3 weekly times. / glucoasome/ chrondrotin.   # follow up in weekly/labs/hold tube/follow-up with me in 2 weeks.

## 2017-02-11 NOTE — Progress Notes (Signed)
11 am - Critical plt count 15 called by Marc Morgans in cancer center lab to [facility], [place]. MD informed at 1115 am. Read back process performed with lab tech and md.

## 2017-02-11 NOTE — Progress Notes (Signed)
[place] OFFICE PROGRESS NOTE  Patient Care Team: [name], MD as PCP - General (Family Medicine) [name], DPM as Consulting Physician (Podiatry) [name], MD as Consulting Physician (Internal Medicine) Robert Bellow, MD (General Surgery)   SUMMARY OF ONCOLOGIC HISTORY:  Oncology History   # April 2018- Left chest wall Bx- IMC; ER-PR-POS; her 2 Neu NEG; PET- mild hilar/distal adenopathy; ~1 cm right lung nodule/ several sub-centimeter.   # [facility]; Abemacliclib [on HOLD sec to cytopenia]  # April 2017-isolated Thrombocytopenia- platelets- 113; worsening PANCYTOPENIA- April 2018- BMBx- no evidence of malignancy; MDS/Acute leuk; Karyotype/MDS- FISH panel-NEG [d/w Dr.Smir];   # June 2018- REPEAT BMBx/[facility] [June 2018-Dr.Foster; Aug 2018- Dr.Powell at [facility]]; No Diagnosis.   # May 29th 2018- N-plate- suboptimal improvement; NOV 1st week start promacta 50 mg/day; suboptimal response; DEC 1st- start promacta 75 mg/day  # 2006- RIGHT BREAST CA [pT1cN0M0; STAGE I; ER/PRPos; Her 2 Neu-NEG] s/p FEC x 6 [NSABP B-36]; Femara [Dec 2007-2012]  # Osteoporosis s/p Reclast; BMD- 2015-wnl- ca+vit D   Dr.Stewart [derm ? 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  Patient has been needing platelet transfusion one almost every week.  Patient had a reaction from platelets with skin rash-improved with premedication/steroids.  Patient's dose of Promacta was just recently increased to 38; she just started taking 75 mg 2 days ago.  Patient noted to  have a nosebleed over the weekend. She also complains of easy bruising; no gum bleeding. She denies any petechial rash on the legs. She denies any blood in stools black stools or bleeding gums.   She continues to take Femara. No weight loss. Appetite is good.  She has an upcoming appointment at [facility] this week.  REVIEW OF SYSTEMS:  A complete 10 point review of system is done which is negative except mentioned above/history of present illness.   PAST MEDICAL HISTORY :  Past Medical History:  Diagnosis Date  . Breast cancer ([place])    right, lumpectomy, radiation, chemo  . Breast cancer ([place])    Right, 2007  . Breast cancer ([place]) 06/20/2016   INVASIVE DUCTAL CARCINOMA.  . Headache   . History of chemotherapy   . History of radiation therapy   . Hyperlipidemia   . Hypertension   . Osteoarthritis    right hip  . Osteoporosis   . Squamous cell carcinoma    leg, Followed by Dr. Nicole Kindred    PAST SURGICAL HISTORY :   Past Surgical History:  Procedure Laterality Date  . ABDOMINAL HYSTERECTOMY    . BREAST BIOPSY Left 2002   left breast, calcifications  . BREAST BIOPSY Left 06/20/2016   INVASIVE DUCTAL CARCINOMA.  Marland Kitchen BREAST EXCISIONAL BIOPSY Right 2007   positive  . bunion repair    . ESOPHAGOGASTRODUODENOSCOPY (EGD) WITH PROPOFOL N/A 08/05/2016   Procedure: ESOPHAGOGASTRODUODENOSCOPY (EGD) WITH PROPOFOL;  Surgeon: San Jetty, MD;  Location: [facility] ENDOSCOPY;  Service: General;  Laterality: N/A;  . left breast biopsy    . NASAL SINUS SURGERY    . right hip replacement    . SHOULDER SURGERY    .  SQUAMOUS CELL CARCINOMA EXCISION     right leg, Dr. Nicole Kindred  . TOTAL HIP ARTHROPLASTY     right    FAMILY HISTORY :   Family History  Problem Relation Age of Onset  . Heart disease Father 57  . Heart disease Mother   . Hyperlipidemia Mother   . Heart disease Brother   . Diabetes Brother   . Diabetes Maternal Grandmother   . Breast cancer Cousin   . Kidney disease Brother      SOCIAL HISTORY:   Social History   Tobacco Use  . Smoking status: Current Some Day Smoker    Packs/day: 0.10    Years: 30.00    Pack years: 3.00    Types: Cigarettes  . Smokeless tobacco: Never Used  Substance Use Topics  . Alcohol use: Yes    Alcohol/week: 0.0 oz    Comment: socially  . Drug use: No    ALLERGIES:  is allergic to codeine; fish-derived products; morphine sulfate; fish allergy; morphine sulfate; and oxycodone.  MEDICATIONS:  Current Outpatient Medications  Medication Sig Dispense Refill  . ALPRAZolam (XANAX) 0.5 MG tablet TAKE 1 TABLET BY MOUTH EVERY 8 HOURS AS NEEDED FOR ANXIETY OR SLEEP 30 tablet 2  . eltrombopag (PROMACTA) 75 MG tablet Take 1 tablet (75 mg total) by mouth daily. Take on an empty stomach 1 hour before meals or 2 hours after. 30 tablet 2  . ergocalciferol (VITAMIN D2) 50000 units capsule Take 1 capsule (50,000 Units total) by mouth once a week. 12 capsule 1  . Fexofenadine HCl (ALLEGRA PO) Take 1 capsule by mouth.    Marland Kitchen glucosamine-chondroitin 500-400 MG tablet Take 1 tablet by mouth daily.     Marland Kitchen letrozole (FEMARA) 2.5 MG tablet Take 1 tablet (2.5 mg total) by mouth daily. Once a day. 90 tablet 3  . metoprolol succinate (TOPROL-XL) 25 MG 24 hr tablet Take 1 tablet (25 mg total) by mouth daily. 90 tablet 3  . pravastatin (PRAVACHOL) 40 MG tablet Take 1 tablet (40 mg total) by mouth daily. 90 tablet 3  . predniSONE (DELTASONE) 20 MG tablet Take 2 tablets (40 mg total) by mouth daily with breakfast. 10 tablet 0  . Probiotic Product (PROBIOTIC & ACIDOPHILUS EX ST PO) Take 1 tablet by mouth daily.      No current facility-administered medications for this visit.     PHYSICAL EXAMINATION: ECOG PERFORMANCE STATUS: 0 - Asymptomatic  BP 100/74 (BP Location: Left Arm, Patient Position: Sitting)   Pulse 98   Temp (!) 97.4 F (36.3 C) (Tympanic)   Resp 16   Wt 145 lb (65.8 kg)   BMI 27.40 kg/m   Filed Weights   02/11/17 1107  Weight: 145 lb  (65.8 kg)    GENERAL: Well-nourished well-developed; Alert, no distress and comfortable.  Accompanied by Her husband. EYES: WNL.  OROPHARYNX: no thrush or ulceration; good dentition. No bleeding noted. NECK: supple, no masses felt LYMPH:  no palpable lymphadenopathy in the cervical, axillary or inguinal regions LUNGS: clear to auscultation and  No wheeze or crackles HEART/CVS: regular rate & rhythm and no murmurs; No lower extremity edema ABDOMEN:abdomen soft, non-tender and normal bowel sounds Musculoskeletal:no cyanosis of digits and no clubbing  PSYCH: alert & oriented x 3 with fluent speech NEURO: no focal motor/sensory deficits SKIN:  Faint bilateral lower extremity petechial rash noted.  Subcutaneous metastatic nodules felt. Improving.   LABORATORY DATA:  I have reviewed the data as listed    Component  Value Date/Time   NA 134 (L) 02/08/2017 0818   K 4.3 02/08/2017 0818   CL 101 02/08/2017 0818   CO2 26 02/08/2017 0818   GLUCOSE 100 (H) 02/08/2017 0818   BUN 13 02/08/2017 0818   CREATININE 0.77 02/08/2017 0818   CREATININE 0.67 07/09/2014 1321   CREATININE 0.70 04/30/2014 1455   CALCIUM 8.7 (L) 02/08/2017 0818   PROT 6.2 (L) 02/08/2017 0818   PROT 7.3 07/09/2014 1321   ALBUMIN 3.8 02/08/2017 0818   ALBUMIN 4.3 07/09/2014 1321   AST 18 02/08/2017 0818   AST 19 07/09/2014 1321   ALT 12 (L) 02/08/2017 0818   ALT 18 07/09/2014 1321   ALKPHOS 66 02/08/2017 0818   ALKPHOS 66 07/09/2014 1321   BILITOT 0.6 02/08/2017 0818   BILITOT 0.6 07/09/2014 1321   GFRNONAA >60 02/08/2017 0818   GFRNONAA >60 07/09/2014 1321   GFRAA >60 02/08/2017 0818   GFRAA >60 07/09/2014 1321    No results found for: SPEP, UPEP  Lab Results  Component Value Date   WBC 3.9 02/11/2017   NEUTROABS 1.9 02/11/2017   HGB 8.5 (L) 02/11/2017   HCT 23.9 (L) 02/11/2017   MCV 108.0 (H) 02/11/2017   PLT 15 (LL) 02/11/2017      Chemistry      Component Value Date/Time   NA 134 (L) 02/08/2017  0818   K 4.3 02/08/2017 0818   CL 101 02/08/2017 0818   CO2 26 02/08/2017 0818   BUN 13 02/08/2017 0818   CREATININE 0.77 02/08/2017 0818   CREATININE 0.67 07/09/2014 1321   CREATININE 0.70 04/30/2014 1455      Component Value Date/Time   CALCIUM 8.7 (L) 02/08/2017 0818   ALKPHOS 66 02/08/2017 0818   ALKPHOS 66 07/09/2014 1321   AST 18 02/08/2017 0818   AST 19 07/09/2014 1321   ALT 12 (L) 02/08/2017 0818   ALT 18 07/09/2014 1321   BILITOT 0.6 02/08/2017 0818   BILITOT 0.6 07/09/2014 1321        ASSESSMENT & PLAN:   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 On Femara.  AUG 10th - improved response- improvement of the hilar/mediastinal adenopathy. Tolerating Femara well without any major side effects. No clinical progression noted.   # Severe thrombocytopenia//anemia- highly suspicious for MDS [s/p III opinion at [facility]] however extensive workup negative for any obvious cause of her severe thrombocytopenia.  Patient currently on Promacta 75 mg a day [increase the dose 2 days ago]. patient's platelets 15  Today- s/p platelets 3 days ago. Awaiting appt at [facility]- on 12/06.  Given upcoming travel-2 DC recommend platelet transfusion tomorrow.  #Platelet transfusion reaction-status post steroids premedication improved.    # Severe anemia Anemia- Hb 8.1 s/p transfusion [approximately 1 month ago]. [facility] this week.  # Arthralgais- secondary to AI-improved. Recommend high-dose VitD3 weekly times. / glucoasome/ chrondrotin.   # follow up in weekly/labs/hold tube/follow-up with me in 2 weeks.     [name], MD 02/11/2017 2:33 PM

## 2017-02-12 ENCOUNTER — Inpatient Hospital Stay: Payer: Medicare Other

## 2017-02-12 DIAGNOSIS — D696 Thrombocytopenia, unspecified: Secondary | ICD-10-CM

## 2017-02-12 DIAGNOSIS — R21 Rash and other nonspecific skin eruption: Secondary | ICD-10-CM | POA: Diagnosis not present

## 2017-02-12 DIAGNOSIS — C50811 Malignant neoplasm of overlapping sites of right female breast: Secondary | ICD-10-CM | POA: Diagnosis not present

## 2017-02-12 DIAGNOSIS — Z170 Estrogen receptor positive status [ER+]: Secondary | ICD-10-CM | POA: Diagnosis not present

## 2017-02-12 DIAGNOSIS — J189 Pneumonia, unspecified organism: Secondary | ICD-10-CM | POA: Diagnosis not present

## 2017-02-12 DIAGNOSIS — D649 Anemia, unspecified: Secondary | ICD-10-CM | POA: Diagnosis not present

## 2017-02-12 MED ORDER — SODIUM CHLORIDE 0.9 % IV SOLN
250.0000 mL | Freq: Once | INTRAVENOUS | Status: AC
  Administered 2017-02-12: 250 mL via INTRAVENOUS
  Filled 2017-02-12: qty 250

## 2017-02-12 MED ORDER — ACETAMINOPHEN 325 MG PO TABS
650.0000 mg | ORAL_TABLET | Freq: Once | ORAL | Status: AC
  Administered 2017-02-12: 650 mg via ORAL
  Filled 2017-02-12: qty 2

## 2017-02-12 MED ORDER — SODIUM CHLORIDE 0.9% FLUSH
10.0000 mL | INTRAVENOUS | Status: DC | PRN
  Filled 2017-02-12: qty 10

## 2017-02-12 MED ORDER — METHYLPREDNISOLONE SODIUM SUCC 125 MG IJ SOLR
60.0000 mg | Freq: Once | INTRAMUSCULAR | Status: AC
  Administered 2017-02-12: 60 mg via INTRAVENOUS
  Filled 2017-02-12: qty 2

## 2017-02-12 MED ORDER — DIPHENHYDRAMINE HCL 25 MG PO CAPS
25.0000 mg | ORAL_CAPSULE | Freq: Once | ORAL | Status: AC
  Administered 2017-02-12: 25 mg via ORAL
  Filled 2017-02-12: qty 1

## 2017-02-13 DIAGNOSIS — C50919 Malignant neoplasm of unspecified site of unspecified female breast: Secondary | ICD-10-CM | POA: Diagnosis not present

## 2017-02-13 DIAGNOSIS — D61818 Other pancytopenia: Secondary | ICD-10-CM | POA: Diagnosis not present

## 2017-02-13 LAB — BPAM PLATELET PHERESIS
BLOOD PRODUCT EXPIRATION DATE: 201812052359
ISSUE DATE / TIME: 201812040938
Unit Type and Rh: 5100

## 2017-02-13 LAB — PREPARE PLATELET PHERESIS: UNIT DIVISION: 0

## 2017-02-14 DIAGNOSIS — C7802 Secondary malignant neoplasm of left lung: Secondary | ICD-10-CM | POA: Diagnosis not present

## 2017-02-14 DIAGNOSIS — Z853 Personal history of malignant neoplasm of breast: Secondary | ICD-10-CM | POA: Diagnosis not present

## 2017-02-14 DIAGNOSIS — C781 Secondary malignant neoplasm of mediastinum: Secondary | ICD-10-CM | POA: Diagnosis not present

## 2017-02-14 DIAGNOSIS — J9819 Other pulmonary collapse: Secondary | ICD-10-CM

## 2017-02-14 DIAGNOSIS — C50919 Malignant neoplasm of unspecified site of unspecified female breast: Secondary | ICD-10-CM | POA: Diagnosis not present

## 2017-02-14 DIAGNOSIS — C7801 Secondary malignant neoplasm of right lung: Secondary | ICD-10-CM | POA: Diagnosis not present

## 2017-02-14 DIAGNOSIS — D696 Thrombocytopenia, unspecified: Secondary | ICD-10-CM | POA: Diagnosis not present

## 2017-02-14 DIAGNOSIS — Z923 Personal history of irradiation: Secondary | ICD-10-CM | POA: Diagnosis not present

## 2017-02-14 DIAGNOSIS — Z9221 Personal history of antineoplastic chemotherapy: Secondary | ICD-10-CM | POA: Diagnosis not present

## 2017-02-14 DIAGNOSIS — Z79811 Long term (current) use of aromatase inhibitors: Secondary | ICD-10-CM | POA: Diagnosis not present

## 2017-02-14 DIAGNOSIS — R5383 Other fatigue: Secondary | ICD-10-CM | POA: Diagnosis not present

## 2017-02-14 DIAGNOSIS — D6869 Other thrombophilia: Secondary | ICD-10-CM | POA: Diagnosis not present

## 2017-02-14 DIAGNOSIS — R000 Tachycardia, unspecified: Secondary | ICD-10-CM | POA: Diagnosis not present

## 2017-02-14 HISTORY — DX: Other pulmonary collapse: J98.19

## 2017-02-18 ENCOUNTER — Ambulatory Visit: Payer: Medicare Other

## 2017-02-18 ENCOUNTER — Other Ambulatory Visit: Payer: Medicare Other

## 2017-02-19 ENCOUNTER — Telehealth: Payer: Self-pay | Admitting: Internal Medicine

## 2017-02-19 ENCOUNTER — Other Ambulatory Visit: Payer: Self-pay | Admitting: *Deleted

## 2017-02-19 ENCOUNTER — Inpatient Hospital Stay: Payer: Medicare Other

## 2017-02-19 ENCOUNTER — Telehealth: Payer: Self-pay | Admitting: *Deleted

## 2017-02-19 DIAGNOSIS — Z170 Estrogen receptor positive status [ER+]: Principal | ICD-10-CM

## 2017-02-19 DIAGNOSIS — D649 Anemia, unspecified: Secondary | ICD-10-CM

## 2017-02-19 DIAGNOSIS — C50811 Malignant neoplasm of overlapping sites of right female breast: Secondary | ICD-10-CM

## 2017-02-19 DIAGNOSIS — D696 Thrombocytopenia, unspecified: Secondary | ICD-10-CM | POA: Diagnosis not present

## 2017-02-19 DIAGNOSIS — J189 Pneumonia, unspecified organism: Secondary | ICD-10-CM | POA: Diagnosis not present

## 2017-02-19 DIAGNOSIS — D469 Myelodysplastic syndrome, unspecified: Secondary | ICD-10-CM

## 2017-02-19 DIAGNOSIS — R21 Rash and other nonspecific skin eruption: Secondary | ICD-10-CM | POA: Diagnosis not present

## 2017-02-19 LAB — CBC WITH DIFFERENTIAL/PLATELET
BASOS ABS: 0 10*3/uL (ref 0.0–0.1)
BASOS PCT: 0 %
EOS ABS: 0 10*3/uL (ref 0.0–0.7)
Eosinophils Relative: 1 %
HEMATOCRIT: 20.8 % — AB (ref 36.0–46.0)
HEMOGLOBIN: 7.2 g/dL — AB (ref 12.0–15.0)
Lymphocytes Relative: 37 %
Lymphs Abs: 1.2 10*3/uL (ref 0.7–4.0)
MCH: 37.8 pg — ABNORMAL HIGH (ref 26.0–34.0)
MCHC: 34.8 g/dL (ref 30.0–36.0)
MCV: 108.7 fL — ABNORMAL HIGH (ref 78.0–100.0)
Monocytes Absolute: 0.2 10*3/uL (ref 0.1–1.0)
Monocytes Relative: 6 %
NEUTROS ABS: 1.8 10*3/uL (ref 1.7–7.7)
NEUTROS PCT: 56 %
Platelets: 12 10*3/uL — CL (ref 150–400)
RBC: 1.91 MIL/uL — ABNORMAL LOW (ref 3.87–5.11)
RDW: 22.3 % — AB (ref 11.5–15.5)
WBC: 3.3 10*3/uL — ABNORMAL LOW (ref 4.0–10.5)

## 2017-02-19 LAB — PREPARE RBC (CROSSMATCH)

## 2017-02-19 LAB — SAMPLE TO BLOOD BANK

## 2017-02-19 MED ORDER — SODIUM CHLORIDE 0.9 % IV SOLN
250.0000 mL | Freq: Once | INTRAVENOUS | Status: AC
  Administered 2017-02-19: 250 mL via INTRAVENOUS
  Filled 2017-02-19: qty 250

## 2017-02-19 MED ORDER — ELTROMBOPAG OLAMINE 75 MG PO TABS: 150.0000 mg | ORAL_TABLET | Freq: Every day | ORAL | 2 refills | Status: DC

## 2017-02-19 MED ORDER — METHYLPREDNISOLONE SODIUM SUCC 125 MG IJ SOLR
60.0000 mg | Freq: Once | INTRAMUSCULAR | Status: AC
  Administered 2017-02-19: 60 mg via INTRAVENOUS
  Filled 2017-02-19: qty 2

## 2017-02-19 MED ORDER — DIPHENHYDRAMINE HCL 25 MG PO CAPS
50.0000 mg | ORAL_CAPSULE | Freq: Once | ORAL | Status: AC
  Administered 2017-02-19: 50 mg via ORAL
  Filled 2017-02-19: qty 2

## 2017-02-19 MED ORDER — ACETAMINOPHEN 325 MG PO TABS
650.0000 mg | ORAL_TABLET | Freq: Once | ORAL | Status: AC
  Administered 2017-02-19: 650 mg via ORAL
  Filled 2017-02-19: qty 2

## 2017-02-19 NOTE — Telephone Encounter (Signed)
Discussed with Dr.Cunnigham; Shelly Arias. ? Hypoplastic MDS vs others-repeating [facility] flow cytometry;  options- include- increasing Promacta to 150 mg/day; ATG plus cyclosporine.

## 2017-02-19 NOTE — Addendum Note (Signed)
Addended by: Darl Pikes on: 02/19/2017 04:07 PM   Modules accepted: Orders

## 2017-02-19 NOTE — Telephone Encounter (Signed)
Oral Chemotherapy Pharmacist Encounter  New prescription for Promacta 150mg  sent to [facility]. Will f/u to make sure prescription is processed by [facility].  Darl Pikes, PharmD, BCPS Hematology/Oncology Clinical Pharmacist [facility] [phone]  02/19/2017 3:58 PM

## 2017-02-19 NOTE — Telephone Encounter (Signed)
Lab called plt results of 12 and her hgb is 7.2 Per Infusion, there are no orders in for her

## 2017-02-19 NOTE — Telephone Encounter (Signed)
Spoke with Dr. Rogue Bussing- orders given for 1 unit of plts and 1 unit of blood. I spoke with Shelly Shearer, RN. The patient will be able to receive both plts and blood today. Orders entered and released for blood bank.

## 2017-02-20 LAB — TYPE AND SCREEN
ABO/RH(D): O POS
ANTIBODY SCREEN: POSITIVE
DONOR AG TYPE: NEGATIVE
UNIT TAG COMMENT: NEGATIVE
Unit division: 0

## 2017-02-20 LAB — BPAM RBC
Blood Product Expiration Date: 201812272359
ISSUE DATE / TIME: 201812111451
Unit Type and Rh: 5100

## 2017-02-20 LAB — PREPARE PLATELET PHERESIS: UNIT DIVISION: 0

## 2017-02-20 LAB — BPAM PLATELET PHERESIS
Blood Product Expiration Date: 201812132359
ISSUE DATE / TIME: 201812111358
UNIT TYPE AND RH: 5100

## 2017-02-22 ENCOUNTER — Ambulatory Visit
Admission: RE | Admit: 2017-02-22 | Discharge: 2017-02-22 | Disposition: A | Discharge status: Home / Self Care | Payer: Medicare Other | Source: Ambulatory Visit | Attending: Oncology | Admitting: Oncology

## 2017-02-22 ENCOUNTER — Encounter: Payer: Self-pay | Admitting: Oncology

## 2017-02-22 ENCOUNTER — Inpatient Hospital Stay (HOSPITAL_BASED_OUTPATIENT_CLINIC_OR_DEPARTMENT_OTHER): Payer: Medicare Other | Admitting: Oncology

## 2017-02-22 VITALS — BP 138/87 | HR 114 | Temp 98.9°F | Resp 16 | Wt 145.0 lb

## 2017-02-22 DIAGNOSIS — D61818 Other pancytopenia: Secondary | ICD-10-CM

## 2017-02-22 DIAGNOSIS — Z853 Personal history of malignant neoplasm of breast: Secondary | ICD-10-CM

## 2017-02-22 DIAGNOSIS — Z79811 Long term (current) use of aromatase inhibitors: Secondary | ICD-10-CM

## 2017-02-22 DIAGNOSIS — J189 Pneumonia, unspecified organism: Secondary | ICD-10-CM | POA: Diagnosis not present

## 2017-02-22 DIAGNOSIS — D649 Anemia, unspecified: Secondary | ICD-10-CM

## 2017-02-22 DIAGNOSIS — M810 Age-related osteoporosis without current pathological fracture: Secondary | ICD-10-CM | POA: Diagnosis not present

## 2017-02-22 DIAGNOSIS — I10 Essential (primary) hypertension: Secondary | ICD-10-CM

## 2017-02-22 DIAGNOSIS — R0602 Shortness of breath: Secondary | ICD-10-CM | POA: Diagnosis not present

## 2017-02-22 DIAGNOSIS — R000 Tachycardia, unspecified: Secondary | ICD-10-CM

## 2017-02-22 DIAGNOSIS — R918 Other nonspecific abnormal finding of lung field: Secondary | ICD-10-CM | POA: Diagnosis not present

## 2017-02-22 DIAGNOSIS — C50811 Malignant neoplasm of overlapping sites of right female breast: Secondary | ICD-10-CM

## 2017-02-22 DIAGNOSIS — E785 Hyperlipidemia, unspecified: Secondary | ICD-10-CM | POA: Diagnosis not present

## 2017-02-22 DIAGNOSIS — Z923 Personal history of irradiation: Secondary | ICD-10-CM

## 2017-02-22 DIAGNOSIS — Z79899 Other long term (current) drug therapy: Secondary | ICD-10-CM

## 2017-02-22 DIAGNOSIS — J181 Lobar pneumonia, unspecified organism: Principal | ICD-10-CM

## 2017-02-22 DIAGNOSIS — F17210 Nicotine dependence, cigarettes, uncomplicated: Secondary | ICD-10-CM

## 2017-02-22 DIAGNOSIS — Z170 Estrogen receptor positive status [ER+]: Secondary | ICD-10-CM | POA: Diagnosis not present

## 2017-02-22 DIAGNOSIS — R21 Rash and other nonspecific skin eruption: Secondary | ICD-10-CM | POA: Diagnosis not present

## 2017-02-22 DIAGNOSIS — D696 Thrombocytopenia, unspecified: Secondary | ICD-10-CM | POA: Diagnosis not present

## 2017-02-22 DIAGNOSIS — Z803 Family history of malignant neoplasm of breast: Secondary | ICD-10-CM

## 2017-02-22 DIAGNOSIS — M1990 Unspecified osteoarthritis, unspecified site: Secondary | ICD-10-CM

## 2017-02-22 MED ORDER — PREDNISONE 10 MG (21) PO TBPK: ORAL_TABLET | ORAL | 0 refills | Status: DC

## 2017-02-22 MED ORDER — LEVOFLOXACIN 500 MG PO TABS: 500.0000 mg | ORAL_TABLET | Freq: Every day | ORAL | 0 refills | Status: DC

## 2017-02-22 NOTE — Progress Notes (Signed)
Symptom Management Consult note [facility]  Telephone:([phone] [phone]  Patient Care Team: [name], MD as PCP - General (Family Medicine) [name], DPM as Consulting Physician (Podiatry) [name], MD as Consulting Physician (Internal Medicine) Bary Castilla Forest Gleason, MD (General Surgery)   Name of the patient: Shelly Arias  [id number]  1945-07-17   Date of visit: 02/22/17  Diagnosis- Carcinoma of overlapping sites of right breast in female, estrogen receptor positive [facility])  Chief complaint/ Reason for visit- Bronchitis/Shortness of Breath  Heme/Onc history:   # April 2018- Left chest wall Bx- IMC; ER-PR-POS; her 2 Neu NEG; PET- mild hilar/distal adenopathy; ~1 cm right lung nodule/ several sub-centimeter.   # May 2018[facility]; Abemacliclib [on HOLD sec to cytopenia]  # April 2017-isolated Thrombocytopenia- platelets- 113; worsening PANCYTOPENIA- April 2018- BMBx- no evidence of malignancy; MDS/Acute leuk; Karyotype/MDS- FISH panel-NEG [d/w Dr.Smir];   # June 2018- REPEAT BMBx/[facility] [June 2018-Dr.Foster; Aug 2018- Dr.Powell at [facility]]; No Diagnosis.   # May 29th 2018- N-plate- suboptimal improvement; NOV 1st week start promacta 50 mg/day; suboptimal response; DEC 1st- start promacta 75 mg/day  # 2006- RIGHT BREAST CA [pT1cN0M0; STAGE I; ER/PRPos; Her 2 Neu-NEG] s/p FEC x 6 [NSABP B-36]; Femara [Dec 2007-2012]  # Osteoporosis s/p Reclast; BMD- 2015-wnl- ca+vit D   Dr.Stewart [derm ? Squamous cell s/p freeze-oct 2018]      Interval history- Patient was last seen by Dr. Rogue Bussing on 02/11/2017. Patient has been seen weekly for the last several months for platelets transfusion but unfortunately has had several skin rash reactions. This has improved with premedication steroids. Patient also had adjustments made to her Promacta. Patient is scheduled for second opinion at [place]  next week.   Patient presents for possible bronchitis, shortness of breath, wheezing, and productive cough.   Patient presents for presents evaluation of sore throat, productive cough, shortness of breath, and wheezing . Symptoms began 3 days ago and have worsened since that time.  Past history is significant for occasional episodes of bronchitis. She noted last night chills but did not check her temperature. She states she has been stubborn and did not want to come in to be evaluated. Her cough is worse at night and she has been coughing up yellow sputum. Shortness of breath is mostly with exertion and she could hear some wheezing when lying down last night. Her appetite appears to be okay and she is drinking plenty of fluids.  ECOG FS:0 - Asymptomatic  Review of systems- Review of Systems  Constitutional: Positive for chills, fever and malaise/fatigue. Negative for weight loss.  HENT: Positive for congestion and sore throat.   Eyes: Negative.   Respiratory: Positive for cough, shortness of breath and wheezing.   Cardiovascular: Negative.   Gastrointestinal: Negative.   Genitourinary: Negative.   Musculoskeletal: Negative.   Skin: Negative.   Neurological: Positive for weakness.  Endo/Heme/Allergies: Negative.   Psychiatric/Behavioral: Negative.      Current treatment- Promacta  Allergies  Allergen Reactions  . Codeine Anaphylaxis  . Fish-Derived Products Anaphylaxis  . Morphine Sulfate Anaphylaxis    REACTION: anaphylaxis  . Fish Allergy   . Morphine Sulfate     REACTION: anaphylaxis  . Oxycodone     Nausea and vomiting     Past Medical History:  Diagnosis Date  . Breast cancer ([place])    right, lumpectomy, radiation, chemo  . Breast cancer ([place])    Right, 2007  .  Breast cancer ([place]) 06/20/2016   INVASIVE DUCTAL CARCINOMA.  . Headache   . History of chemotherapy   . History of radiation therapy   . Hyperlipidemia   . Hypertension   . Osteoarthritis    right hip    . Osteoporosis   . Squamous cell carcinoma    leg, Followed by Dr. Nicole Kindred     Past Surgical History:  Procedure Laterality Date  . ABDOMINAL HYSTERECTOMY    . BREAST BIOPSY Left 2002   left breast, calcifications  . BREAST BIOPSY Left 06/20/2016   INVASIVE DUCTAL CARCINOMA.  Marland Kitchen BREAST EXCISIONAL BIOPSY Right 2007   positive  . bunion repair    . ESOPHAGOGASTRODUODENOSCOPY (EGD) WITH PROPOFOL N/A 08/05/2016   Procedure: ESOPHAGOGASTRODUODENOSCOPY (EGD) WITH PROPOFOL;  Surgeon: San Jetty, MD;  Location: [facility] ENDOSCOPY;  Service: General;  Laterality: N/A;  . left breast biopsy    . NASAL SINUS SURGERY    . right hip replacement    . SHOULDER SURGERY    . SQUAMOUS CELL CARCINOMA EXCISION     right leg, Dr. Nicole Kindred  . TOTAL HIP ARTHROPLASTY     right    Social History   Socioeconomic History  . Marital status: Married    Spouse name: Not on file  . Number of children: Not on file  . Years of education: Not on file  . Highest education level: Not on file  Social Needs  . Financial resource strain: Not hard at all  . Food insecurity - worry: Not on file  . Food insecurity - inability: Not on file  . Transportation needs - medical: No  . Transportation needs - non-medical: No  Occupational History  . Not on file  Tobacco Use  . Smoking status: Current Some Day Smoker    Packs/day: 0.10    Years: 30.00    Pack years: 3.00    Types: Cigarettes  . Smokeless tobacco: Never Used  Substance and Sexual Activity  . Alcohol use: Yes    Alcohol/week: 0.0 oz    Comment: socially  . Drug use: No  . Sexual activity: No  Other Topics Concern  . Not on file  Social History Narrative  . Not on file    Family History  Problem Relation Age of Onset  . Heart disease Father 61  . Heart disease Mother   . Hyperlipidemia Mother   . Heart disease Brother   . Diabetes Brother   . Diabetes Maternal Grandmother   . Breast cancer Cousin   . Kidney disease Brother       Current Outpatient Medications:  .  ALPRAZolam (XANAX) 0.5 MG tablet, TAKE 1 TABLET BY MOUTH EVERY 8 HOURS AS NEEDED FOR ANXIETY OR SLEEP, Disp: 30 tablet, Rfl: 2 .  eltrombopag (PROMACTA) 75 MG tablet, Take 2 tablets (150 mg total) by mouth daily. Take on an empty stomach 1 hour before meals or 2 hours after., Disp: 60 tablet, Rfl: 2 .  ergocalciferol (VITAMIN D2) 50000 units capsule, Take 1 capsule (50,000 Units total) by mouth once a week., Disp: 12 capsule, Rfl: 1 .  Fexofenadine HCl (ALLEGRA PO), Take 1 capsule by mouth., Disp: , Rfl:  .  glucosamine-chondroitin 500-400 MG tablet, Take 1 tablet by mouth daily. , Disp: , Rfl:  .  letrozole (FEMARA) 2.5 MG tablet, Take 1 tablet (2.5 mg total) by mouth daily. Once a day., Disp: 90 tablet, Rfl: 3 .  metoprolol succinate (TOPROL-XL) 25 MG 24 hr tablet, Take 1  tablet (25 mg total) by mouth daily., Disp: 90 tablet, Rfl: 3 .  pravastatin (PRAVACHOL) 40 MG tablet, Take 1 tablet (40 mg total) by mouth daily., Disp: 90 tablet, Rfl: 3 .  predniSONE (DELTASONE) 20 MG tablet, Take 2 tablets (40 mg total) by mouth daily with breakfast. (Patient not taking: Reported on 02/25/2017), Disp: 10 tablet, Rfl: 0 .  Probiotic Product (PROBIOTIC & ACIDOPHILUS EX ST PO), Take 1 tablet by mouth daily. , Disp: , Rfl:  .  levofloxacin (LEVAQUIN) 500 MG tablet, Take 1 tablet (500 mg total) by mouth daily., Disp: 10 tablet, Rfl: 0 .  predniSONE (STERAPRED UNI-PAK 21 TAB) 10 MG (21) TBPK tablet, Take as presribed, Disp: 21 tablet, Rfl: 0  Physical exam:  Vitals:   02/22/17 1046  BP: 138/87  Pulse: (!) 114  Resp: 16  Temp: 98.9 F (37.2 C)  TempSrc: Tympanic  SpO2: 95%  Weight: 145 lb (65.8 kg)   Physical Exam  Constitutional: She is oriented to person, place, and time and well-developed, well-nourished, and in no distress. Vital signs are normal.  HENT:  Head: Normocephalic and atraumatic.  Eyes: Pupils are equal, round, and reactive to light.  Neck:  Normal range of motion.  Cardiovascular: Regular rhythm. Tachycardia present.  Pulmonary/Chest: Effort normal. She has decreased breath sounds. She has wheezes in the right upper field, the right lower field, the left upper field and the left lower field.  Abdominal: Soft. Normal appearance and bowel sounds are normal.  Musculoskeletal: Normal range of motion.  Neurological: She is alert and oriented to person, place, and time.  Skin: Skin is warm, dry and intact.     CMP Latest Ref Rng & Units 02/25/2017  Glucose 65 - 99 mg/dL -  BUN 6 - 20 mg/dL -  Creatinine 0.44 - 1.00 mg/dL 1.09(H)  Sodium 135 - 145 mmol/L -  Potassium 3.5 - 5.1 mmol/L -  Chloride 101 - 111 mmol/L -  CO2 22 - 32 mmol/L -  Calcium 8.9 - 10.3 mg/dL -  Total Protein 6.5 - 8.1 g/dL -  Total Bilirubin 0.3 - 1.2 mg/dL -  Alkaline Phos 38 - 126 U/L -  AST 15 - 41 U/L -  ALT 14 - 54 U/L -   CBC Latest Ref Rng & Units 02/25/2017  WBC 3.6 - 11.0 K/uL 5.8  Hemoglobin 12.0 - 16.0 g/dL 10.1(L)  Hematocrit 35.0 - 47.0 % 28.9(L)  Platelets 150 - 400 K/uL 23(LL)    No images are attached to the encounter.  Dg Chest 2 View  Result Date: 02/22/2017 CLINICAL DATA:  Shortness of breath. EXAM: CHEST  2 VIEW COMPARISON:  PET-CT 10/19/2016.  CT chest 06/13/2016. FINDINGS: Mediastinum hilar structures are normal. Mild right middle lobe infiltrate noted suggesting mild pneumonia. Reference is made to prior PET-CT of 10/19/2016 and 06/13/2016 for discussion of pulmonary nodules and adenopathy previously noted. No pleural effusion or pneumothorax. Surgical clips right chest. Degenerative changes scoliosis thoracic spine. IMPRESSION: Mild right middle lobe infiltrate suggesting mild pneumonia. Electronically Signed   By: Marcello Moores  Register   On: 02/22/2017 12:06     Assessment and plan- Patient is a 71 y.o. female presents with a sore throat, congestion, cough and wheezing. On initial assessment patient appears well. Non tender  sinuses palpated. Oropharynx  free from exudate and erythema. Lungs are wheezy bilaterally. Patient coughs frequently in examination room. No labs drawn today. Patient is tachycardic. She is afebrile. Blood pressure normal.   1. STAT Chest  X-ray. Chest X-ray revealed possible right lower lobe pneumonia. RX prednisone and RX Levaquin. Education provided to patient regarding side effects of the medications. Patient will be reevaluated by Dr. Rogue Bussing on Monday. Patient instructed to call if symptoms worsened. 2. RTC as scheduled to see Dr. Rogue Bussing on Monday for labs and a platelet transfusion.   Visit Diagnosis 1. Pneumonia of right lower lobe due to infectious organism ([place])   2. Shortness of breath     Patient expressed understanding and was in agreement with this plan. She also understands that She can call clinic at any time with any questions, concerns, or complaints.   Greater than 50% was spent in counseling and coordination of care with this patient including but not limited to discussion of the relevant topics above (See A&P) including, but not limited to diagnosis and management of acute and chronic medical conditions.    Faythe Casa, AGNP-C [facility]- [phone] Pager- [phone] 02/25/2017 3:56 PM

## 2017-02-25 ENCOUNTER — Other Ambulatory Visit: Payer: Self-pay

## 2017-02-25 ENCOUNTER — Inpatient Hospital Stay (HOSPITAL_BASED_OUTPATIENT_CLINIC_OR_DEPARTMENT_OTHER): Payer: Medicare Other | Admitting: Internal Medicine

## 2017-02-25 ENCOUNTER — Ambulatory Visit
Admission: RE | Admit: 2017-02-25 | Discharge: 2017-02-25 | Disposition: A | Discharge status: Home / Self Care | Payer: Medicare Other | Source: Ambulatory Visit | Attending: Internal Medicine | Admitting: Internal Medicine

## 2017-02-25 ENCOUNTER — Encounter: Payer: Self-pay | Admitting: Internal Medicine

## 2017-02-25 ENCOUNTER — Telehealth: Payer: Self-pay | Admitting: Internal Medicine

## 2017-02-25 ENCOUNTER — Inpatient Hospital Stay: Payer: Medicare Other

## 2017-02-25 VITALS — BP 138/75 | HR 80 | Temp 98.2°F | Resp 22 | Ht 61.0 in | Wt 139.0 lb

## 2017-02-25 DIAGNOSIS — R05 Cough: Secondary | ICD-10-CM | POA: Diagnosis not present

## 2017-02-25 DIAGNOSIS — Z923 Personal history of irradiation: Secondary | ICD-10-CM

## 2017-02-25 DIAGNOSIS — M810 Age-related osteoporosis without current pathological fracture: Secondary | ICD-10-CM

## 2017-02-25 DIAGNOSIS — E785 Hyperlipidemia, unspecified: Secondary | ICD-10-CM

## 2017-02-25 DIAGNOSIS — Z170 Estrogen receptor positive status [ER+]: Secondary | ICD-10-CM | POA: Insufficient documentation

## 2017-02-25 DIAGNOSIS — Z79811 Long term (current) use of aromatase inhibitors: Secondary | ICD-10-CM | POA: Diagnosis not present

## 2017-02-25 DIAGNOSIS — D649 Anemia, unspecified: Secondary | ICD-10-CM | POA: Diagnosis not present

## 2017-02-25 DIAGNOSIS — R21 Rash and other nonspecific skin eruption: Secondary | ICD-10-CM | POA: Diagnosis not present

## 2017-02-25 DIAGNOSIS — D469 Myelodysplastic syndrome, unspecified: Secondary | ICD-10-CM | POA: Diagnosis not present

## 2017-02-25 DIAGNOSIS — C50811 Malignant neoplasm of overlapping sites of right female breast: Secondary | ICD-10-CM | POA: Diagnosis not present

## 2017-02-25 DIAGNOSIS — I10 Essential (primary) hypertension: Secondary | ICD-10-CM

## 2017-02-25 DIAGNOSIS — R911 Solitary pulmonary nodule: Secondary | ICD-10-CM | POA: Insufficient documentation

## 2017-02-25 DIAGNOSIS — D61818 Other pancytopenia: Secondary | ICD-10-CM

## 2017-02-25 DIAGNOSIS — M1990 Unspecified osteoarthritis, unspecified site: Secondary | ICD-10-CM

## 2017-02-25 DIAGNOSIS — R000 Tachycardia, unspecified: Secondary | ICD-10-CM | POA: Diagnosis not present

## 2017-02-25 DIAGNOSIS — F17210 Nicotine dependence, cigarettes, uncomplicated: Secondary | ICD-10-CM

## 2017-02-25 DIAGNOSIS — D696 Thrombocytopenia, unspecified: Secondary | ICD-10-CM

## 2017-02-25 DIAGNOSIS — I700 Atherosclerosis of aorta: Secondary | ICD-10-CM | POA: Diagnosis not present

## 2017-02-25 DIAGNOSIS — J439 Emphysema, unspecified: Secondary | ICD-10-CM | POA: Insufficient documentation

## 2017-02-25 DIAGNOSIS — R058 Other specified cough: Secondary | ICD-10-CM | POA: Insufficient documentation

## 2017-02-25 DIAGNOSIS — Z853 Personal history of malignant neoplasm of breast: Secondary | ICD-10-CM

## 2017-02-25 DIAGNOSIS — J189 Pneumonia, unspecified organism: Secondary | ICD-10-CM

## 2017-02-25 DIAGNOSIS — Z79899 Other long term (current) drug therapy: Secondary | ICD-10-CM

## 2017-02-25 DIAGNOSIS — Z803 Family history of malignant neoplasm of breast: Secondary | ICD-10-CM

## 2017-02-25 LAB — CBC WITH DIFFERENTIAL/PLATELET
BASOS ABS: 0 10*3/uL (ref 0–0.1)
BASOS PCT: 0 %
EOS PCT: 0 %
Eosinophils Absolute: 0 10*3/uL (ref 0–0.7)
HCT: 28.9 % — ABNORMAL LOW (ref 35.0–47.0)
Hemoglobin: 10.1 g/dL — ABNORMAL LOW (ref 12.0–16.0)
LYMPHS PCT: 26 %
Lymphs Abs: 1.5 10*3/uL (ref 1.0–3.6)
MCH: 36 pg — ABNORMAL HIGH (ref 26.0–34.0)
MCHC: 34.9 g/dL (ref 32.0–36.0)
MCV: 103.1 fL — AB (ref 80.0–100.0)
Monocytes Absolute: 0.6 10*3/uL (ref 0.2–0.9)
Monocytes Relative: 10 %
Neutro Abs: 3.7 10*3/uL (ref 1.4–6.5)
Neutrophils Relative %: 64 %
PLATELETS: 23 10*3/uL — AB (ref 150–400)
RBC: 2.8 MIL/uL — AB (ref 3.80–5.20)
RDW: 23 % — ABNORMAL HIGH (ref 11.5–14.5)
WBC: 5.8 10*3/uL (ref 3.6–11.0)

## 2017-02-25 LAB — CREATININE, SERUM
CREATININE: 1.09 mg/dL — AB (ref 0.44–1.00)
GFR calc Af Amer: 59 mL/min — ABNORMAL LOW (ref >60)
GFR, EST NON AFRICAN AMERICAN: 51 mL/min — AB (ref >60)

## 2017-02-25 LAB — SAMPLE TO BLOOD BANK

## 2017-02-25 MED ORDER — IOPAMIDOL (ISOVUE-300) INJECTION 61%
75.0000 mL | Freq: Once | INTRAVENOUS | Status: AC | PRN
  Administered 2017-02-25: 75 mL via INTRAVENOUS

## 2017-02-25 NOTE — Progress Notes (Signed)
Patient here for follow-up for thrombocytopenia/breast cancer. Pt reports ongoing cough/wheeze. Currently being treated with Levaquin/prednisone taper. Pt has no appetite. Reports fatigue. States that she only rested for about 3-4 hours last evening.

## 2017-02-25 NOTE — Telephone Encounter (Signed)
STAT CT Chest schd and conf with patient for todat at 4 pm.  NPO after 12:30 p.m. Patient understands. Lab/Poss 1 Unit Blood, per 02/25/17 los.(per Vicky, will work around platelets) MF

## 2017-02-25 NOTE — Assessment & Plan Note (Addendum)
#  Severe thrombocytopenia//anemia- highly suspicious for MDS vs ITP; however extensive workup negative for any obvious cause.  Today hemoglobin is 10; platelets 23 [status post transfusions last week]  #Repeat evaluation at [facility]; discussed with Dr.Julia Cunningham-question hypoplastic MDS.  Question benefit using cyclosporine plus ATG.  For now recommend increasing the dose of Promacta to 150 mg once a day.  Recommend taking 75 mg 2 pills once a day- starting today.   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 On Femara.  Await CT scan ordered today [see discussion below].   #Right lower lobe pneumonia/worsening cough-on Levaquin/prednisone; given the slight improvement of the symptoms-recommend CT scan of the chest stat today.  # 1 week/labs;Hold tube; CT scan STAT.   Addendum: Right middle lobe collapse-new.  Otherwise improved metastatic lung nodules. No other lung nodules. I spoke to pt.  

## 2017-02-25 NOTE — Progress Notes (Signed)
[place] OFFICE PROGRESS NOTE  Patient Care Team: [name], MD as PCP - General (Family Medicine) [name], DPM as Consulting Physician (Podiatry) [name], MD as Consulting Physician (Internal Medicine) Robert Bellow, MD (General Surgery)   SUMMARY OF ONCOLOGIC HISTORY:  Oncology History   # April 2018- Left chest wall Bx- IMC; ER-PR-POS; her 2 Neu NEG; PET- mild hilar/distal adenopathy; ~1 cm right lung nodule/ several sub-centimeter.   # May 2018[facility]; Abemacliclib [on HOLD sec to cytopenia]  # April 2017-isolated Thrombocytopenia- platelets- 113; worsening PANCYTOPENIA- April 2018- BMBx- no evidence of malignancy; MDS/Acute leuk; Karyotype/MDS- FISH panel-NEG [d/w Dr.Smir];   # June 2018- REPEAT BMBx/[facility] [June 2018-Dr.Foster; Aug 2018- Dr.Powell at [facility]]; No Diagnosis.   # May 29th 2018- N-plate- suboptimal improvement; NOV 1st week start promacta 50 mg/day; suboptimal response; DEC 1st- start promacta 75 mg/day  # 2006- RIGHT BREAST CA [pT1cN0M0; STAGE I; ER/PRPos; Her 2 Neu-NEG] s/p FEC x 6 [NSABP B-36]; Femara [Dec 2007-2012]  # Osteoporosis s/p Reclast; BMD- 2015-wnl- ca+vit D   Dr.Stewart [derm ? Squamous cell s/p freeze-oct 2018]        Carcinoma of overlapping sites of right breast in female, estrogen receptor positive ([place])    MDS (myelodysplastic syndrome) ([place])     INTERVAL HISTORY:  A very pleasant 71 year old female patient With newly diagnosed recurrent breast cancer- ER/PR positive HER-2/neu negative currently on Femara; And pancytopenia/ with more severe thrombocytopenia- currently Promacta is here for follow-up.  The interim patient underwent a repeat evaluation at [facility] for her thrombocytopenia.  Also patient was recently evaluated by nurse practitioner in the symptom management clinic-for cough/shortness of breath/wheezing.  Chest x-ray showed possible right lower lobe  pneumonia.  Started on prednisone and Levaquin.  Patient symptoms are slightly better-however she continues to have cough.  Patient also received platelet transfusion and blood transfusion last week.  She denies any petechial rash on the legs. She denies any blood in stools black stools or bleeding gums.   She continues to take Femara. No weight loss.  Her appetite is fair.  REVIEW OF SYSTEMS:  A complete 10 point review of system is done which is negative except mentioned above/history of present illness.   PAST MEDICAL HISTORY :  Past Medical History:  Diagnosis Date  . Breast cancer ([place])    right, lumpectomy, radiation, chemo  . Breast cancer ([place])    Right, 2007  . Breast cancer ([place]) 06/20/2016   INVASIVE DUCTAL CARCINOMA.  . Headache   . History of chemotherapy   . History of radiation therapy   . Hyperlipidemia   . Hypertension   . Osteoarthritis    right hip  . Osteoporosis   . Squamous cell carcinoma    leg, Followed by Dr. Nicole Kindred    PAST SURGICAL HISTORY :   Past Surgical History:  Procedure Laterality Date  . ABDOMINAL HYSTERECTOMY    . BREAST BIOPSY Left 2002   left breast, calcifications  . BREAST BIOPSY Left 06/20/2016   INVASIVE DUCTAL CARCINOMA.  Marland Kitchen BREAST EXCISIONAL BIOPSY Right 2007   positive  . bunion repair    . ESOPHAGOGASTRODUODENOSCOPY (EGD) WITH PROPOFOL N/A 08/05/2016   Procedure: ESOPHAGOGASTRODUODENOSCOPY (EGD) WITH PROPOFOL;  Surgeon: San Jetty, MD;  Location: [facility] ENDOSCOPY;  Service: General;  Laterality: N/A;  . left breast biopsy    . NASAL SINUS SURGERY    . right hip replacement    . SHOULDER SURGERY    .  SQUAMOUS CELL CARCINOMA EXCISION     right leg, Dr. Nicole Kindred  . TOTAL HIP ARTHROPLASTY     right    FAMILY HISTORY :   Family History  Problem Relation Age of Onset  . Heart disease Father 60  . Heart disease Mother   . Hyperlipidemia Mother   . Heart disease Brother   . Diabetes Brother   . Diabetes Maternal  Grandmother   . Breast cancer Cousin   . Kidney disease Brother     SOCIAL HISTORY:   Social History   Tobacco Use  . Smoking status: Current Some Day Smoker    Packs/day: 0.10    Years: 30.00    Pack years: 3.00    Types: Cigarettes  . Smokeless tobacco: Never Used  Substance Use Topics  . Alcohol use: Yes    Alcohol/week: 0.0 oz    Comment: socially  . Drug use: No    ALLERGIES:  is allergic to codeine; fish-derived products; morphine sulfate; fish allergy; morphine sulfate; and oxycodone.  MEDICATIONS:  Current Outpatient Medications  Medication Sig Dispense Refill  . ALPRAZolam (XANAX) 0.5 MG tablet TAKE 1 TABLET BY MOUTH EVERY 8 HOURS AS NEEDED FOR ANXIETY OR SLEEP 30 tablet 2  . eltrombopag (PROMACTA) 75 MG tablet Take 2 tablets (150 mg total) by mouth daily. Take on an empty stomach 1 hour before meals or 2 hours after. 60 tablet 2  . ergocalciferol (VITAMIN D2) 50000 units capsule Take 1 capsule (50,000 Units total) by mouth once a week. 12 capsule 1  . Fexofenadine HCl (ALLEGRA PO) Take 1 capsule by mouth.    Marland Kitchen glucosamine-chondroitin 500-400 MG tablet Take 1 tablet by mouth daily.     Marland Kitchen letrozole (FEMARA) 2.5 MG tablet Take 1 tablet (2.5 mg total) by mouth daily. Once a day. 90 tablet 3  . levofloxacin (LEVAQUIN) 500 MG tablet Take 1 tablet (500 mg total) by mouth daily. 10 tablet 0  . metoprolol succinate (TOPROL-XL) 25 MG 24 hr tablet Take 1 tablet (25 mg total) by mouth daily. 90 tablet 3  . pravastatin (PRAVACHOL) 40 MG tablet Take 1 tablet (40 mg total) by mouth daily. 90 tablet 3  . predniSONE (STERAPRED UNI-PAK 21 TAB) 10 MG (21) TBPK tablet Take as presribed 21 tablet 0  . Probiotic Product (PROBIOTIC & ACIDOPHILUS EX ST PO) Take 1 tablet by mouth daily.     . predniSONE (DELTASONE) 20 MG tablet Take 2 tablets (40 mg total) by mouth daily with breakfast. (Patient not taking: Reported on 02/25/2017) 10 tablet 0   No current facility-administered medications  for this visit.     PHYSICAL EXAMINATION: ECOG PERFORMANCE STATUS: 0 - Asymptomatic  BP 138/75 (BP Location: Left Arm, Patient Position: Sitting)   Pulse 80   Temp 98.2 F (36.8 C) (Tympanic)   Resp (!) 22   Ht 5' 1"  (1.549 m)   Wt 139 lb (63 kg)   BMI 26.26 kg/m   Filed Weights   02/25/17 1023  Weight: 139 lb (63 kg)    GENERAL: Well-nourished well-developed; Alert, no distress and comfortable.  Accompanied by Her husband. EYES: WNL.  OROPHARYNX: no thrush or ulceration; good dentition. No bleeding noted. NECK: supple, no masses felt LYMPH:  no palpable lymphadenopathy in the cervical, axillary or inguinal regions LUNGS: Decreased breath sounds to auscultation bilaterally and  No wheeze or crackles HEART/CVS: regular rate & rhythm and no murmurs; No lower extremity edema ABDOMEN:abdomen soft, non-tender and normal bowel sounds  Musculoskeletal:no cyanosis of digits and no clubbing  PSYCH: alert & oriented x 3 with fluent speech NEURO: no focal motor/sensory deficits SKIN: Mild chronic ecchymosis.  Not any worse.   LABORATORY DATA:  I have reviewed the data as listed    Component Value Date/Time   NA 134 (L) 02/08/2017 0818   K 4.3 02/08/2017 0818   CL 101 02/08/2017 0818   CO2 26 02/08/2017 0818   GLUCOSE 100 (H) 02/08/2017 0818   BUN 13 02/08/2017 0818   CREATININE 1.09 (H) 02/25/2017 1200   CREATININE 0.67 07/09/2014 1321   CREATININE 0.70 04/30/2014 1455   CALCIUM 8.7 (L) 02/08/2017 0818   PROT 6.2 (L) 02/08/2017 0818   PROT 7.3 07/09/2014 1321   ALBUMIN 3.8 02/08/2017 0818   ALBUMIN 4.3 07/09/2014 1321   AST 18 02/08/2017 0818   AST 19 07/09/2014 1321   ALT 12 (L) 02/08/2017 0818   ALT 18 07/09/2014 1321   ALKPHOS 66 02/08/2017 0818   ALKPHOS 66 07/09/2014 1321   BILITOT 0.6 02/08/2017 0818   BILITOT 0.6 07/09/2014 1321   GFRNONAA 51 (L) 02/25/2017 1200   GFRNONAA >60 07/09/2014 1321   GFRAA 59 (L) 02/25/2017 1200   GFRAA >60 07/09/2014 1321     No results found for: SPEP, UPEP  Lab Results  Component Value Date   WBC 5.8 02/25/2017   NEUTROABS 3.7 02/25/2017   HGB 10.1 (L) 02/25/2017   HCT 28.9 (L) 02/25/2017   MCV 103.1 (H) 02/25/2017   PLT 23 (LL) 02/25/2017      Chemistry      Component Value Date/Time   NA 134 (L) 02/08/2017 0818   K 4.3 02/08/2017 0818   CL 101 02/08/2017 0818   CO2 26 02/08/2017 0818   BUN 13 02/08/2017 0818   CREATININE 1.09 (H) 02/25/2017 1200   CREATININE 0.67 07/09/2014 1321   CREATININE 0.70 04/30/2014 1455      Component Value Date/Time   CALCIUM 8.7 (L) 02/08/2017 0818   ALKPHOS 66 02/08/2017 0818   ALKPHOS 66 07/09/2014 1321   AST 18 02/08/2017 0818   AST 19 07/09/2014 1321   ALT 12 (L) 02/08/2017 0818   ALT 18 07/09/2014 1321   BILITOT 0.6 02/08/2017 0818   BILITOT 0.6 07/09/2014 1321        ASSESSMENT & PLAN:   MDS (myelodysplastic syndrome) ([facility]) # Severe thrombocytopenia//anemia- highly suspicious for MDS vs ITP; however extensive workup negative for any obvious cause.  Today hemoglobin is 10; platelets 23 [status post transfusions last week]  #Repeat evaluation at [place] center; discussed with Dr.Julia Cunningham-question hypoplastic MDS.  Question benefit using cyclosporine plus ATG.  For now recommend increasing the dose of Promacta to 150 mg once a day.  Recommend taking 75 mg 2 pills once a day- starting today.   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 On Femara.  Await CT scan ordered today [see discussion below].   #Right lower lobe pneumonia/worsening cough-on Levaquin/prednisone; given the slight improvement of the symptoms-recommend CT scan of the chest stat today.  # 1 week/labs;Hold tube; CT scan STAT.      [name], MD 02/25/2017 12:37 PM

## 2017-02-26 ENCOUNTER — Other Ambulatory Visit: Payer: Self-pay | Admitting: Internal Medicine

## 2017-02-26 DIAGNOSIS — D469 Myelodysplastic syndrome, unspecified: Secondary | ICD-10-CM

## 2017-02-26 NOTE — Progress Notes (Signed)
FYI- I ordered flowcytometry for PNH testing at next visit. Thx

## 2017-02-26 NOTE — Progress Notes (Signed)
CT scan review at tumor conference. FYI

## 2017-02-27 NOTE — Progress Notes (Signed)
noted 

## 2017-02-28 ENCOUNTER — Telehealth: Payer: Self-pay | Admitting: Pharmacist

## 2017-02-28 ENCOUNTER — Other Ambulatory Visit: Payer: Self-pay | Admitting: Internal Medicine

## 2017-02-28 DIAGNOSIS — F411 Generalized anxiety disorder: Secondary | ICD-10-CM

## 2017-02-28 NOTE — Telephone Encounter (Signed)
Oral Chemotherapy Pharmacist Encounter  Received a call from [name] stating she had not received her Promacta 150mg  prescription from [facility]. Called [facility] to see what was going on. They stated that they received the prescription on 02/19/17 but that for dose changes the patient has to call to initiate medication shipment. They would not let me initiate ship for her over the phone.   Called [name] to let her know to give [facility] a call. She will give them a call. She has been able to use her current supply of 75mg  tablets to get to her 150mg  daily dose.  Darl Pikes, PharmD, BCPS Hematology/Oncology Clinical Pharmacist [facility] [phone]  02/28/2017 11:33 AM

## 2017-03-04 ENCOUNTER — Inpatient Hospital Stay: Payer: Medicare Other

## 2017-03-04 ENCOUNTER — Other Ambulatory Visit: Payer: Self-pay | Admitting: *Deleted

## 2017-03-04 ENCOUNTER — Inpatient Hospital Stay (HOSPITAL_BASED_OUTPATIENT_CLINIC_OR_DEPARTMENT_OTHER): Payer: Medicare Other | Admitting: Internal Medicine

## 2017-03-04 ENCOUNTER — Encounter: Payer: Self-pay | Admitting: Internal Medicine

## 2017-03-04 ENCOUNTER — Other Ambulatory Visit: Payer: Self-pay

## 2017-03-04 VITALS — BP 130/75 | HR 80 | Temp 97.5°F | Resp 20 | Ht 61.0 in | Wt 138.7 lb

## 2017-03-04 DIAGNOSIS — R000 Tachycardia, unspecified: Secondary | ICD-10-CM

## 2017-03-04 DIAGNOSIS — D696 Thrombocytopenia, unspecified: Secondary | ICD-10-CM

## 2017-03-04 DIAGNOSIS — D61818 Other pancytopenia: Secondary | ICD-10-CM | POA: Diagnosis not present

## 2017-03-04 DIAGNOSIS — Z170 Estrogen receptor positive status [ER+]: Secondary | ICD-10-CM

## 2017-03-04 DIAGNOSIS — I10 Essential (primary) hypertension: Secondary | ICD-10-CM

## 2017-03-04 DIAGNOSIS — M1990 Unspecified osteoarthritis, unspecified site: Secondary | ICD-10-CM

## 2017-03-04 DIAGNOSIS — Z803 Family history of malignant neoplasm of breast: Secondary | ICD-10-CM

## 2017-03-04 DIAGNOSIS — D649 Anemia, unspecified: Secondary | ICD-10-CM

## 2017-03-04 DIAGNOSIS — C50811 Malignant neoplasm of overlapping sites of right female breast: Secondary | ICD-10-CM

## 2017-03-04 DIAGNOSIS — Z79899 Other long term (current) drug therapy: Secondary | ICD-10-CM

## 2017-03-04 DIAGNOSIS — E785 Hyperlipidemia, unspecified: Secondary | ICD-10-CM | POA: Diagnosis not present

## 2017-03-04 DIAGNOSIS — F17210 Nicotine dependence, cigarettes, uncomplicated: Secondary | ICD-10-CM

## 2017-03-04 DIAGNOSIS — D469 Myelodysplastic syndrome, unspecified: Secondary | ICD-10-CM

## 2017-03-04 DIAGNOSIS — Z79811 Long term (current) use of aromatase inhibitors: Secondary | ICD-10-CM

## 2017-03-04 DIAGNOSIS — J189 Pneumonia, unspecified organism: Secondary | ICD-10-CM | POA: Diagnosis not present

## 2017-03-04 DIAGNOSIS — Z923 Personal history of irradiation: Secondary | ICD-10-CM

## 2017-03-04 DIAGNOSIS — R21 Rash and other nonspecific skin eruption: Secondary | ICD-10-CM | POA: Diagnosis not present

## 2017-03-04 DIAGNOSIS — Z853 Personal history of malignant neoplasm of breast: Secondary | ICD-10-CM

## 2017-03-04 DIAGNOSIS — M810 Age-related osteoporosis without current pathological fracture: Secondary | ICD-10-CM

## 2017-03-04 LAB — CBC WITH DIFFERENTIAL/PLATELET
BASOS PCT: 0 %
Basophils Absolute: 0 10*3/uL (ref 0–0.1)
EOS ABS: 0 10*3/uL (ref 0–0.7)
EOS PCT: 1 %
HCT: 23.9 % — ABNORMAL LOW (ref 35.0–47.0)
Hemoglobin: 8.5 g/dL — ABNORMAL LOW (ref 12.0–16.0)
LYMPHS ABS: 1.4 10*3/uL (ref 1.0–3.6)
Lymphocytes Relative: 37 %
MCH: 36.6 pg — AB (ref 26.0–34.0)
MCHC: 35.4 g/dL (ref 32.0–36.0)
MCV: 103.4 fL — ABNORMAL HIGH (ref 80.0–100.0)
Monocytes Absolute: 0.3 10*3/uL (ref 0.2–0.9)
Monocytes Relative: 8 %
Neutro Abs: 2 10*3/uL (ref 1.4–6.5)
Neutrophils Relative %: 54 %
PLATELETS: 13 10*3/uL — AB (ref 150–400)
RBC: 2.31 MIL/uL — AB (ref 3.80–5.20)
RDW: 23.1 % — ABNORMAL HIGH (ref 11.5–14.5)
WBC: 3.8 10*3/uL (ref 3.6–11.0)

## 2017-03-04 LAB — BASIC METABOLIC PANEL
Anion gap: 7 (ref 5–15)
BUN: 17 mg/dL (ref 6–20)
CALCIUM: 8.6 mg/dL — AB (ref 8.9–10.3)
CHLORIDE: 100 mmol/L — AB (ref 101–111)
CO2: 24 mmol/L (ref 22–32)
CREATININE: 1.26 mg/dL — AB (ref 0.44–1.00)
GFR, EST AFRICAN AMERICAN: 49 mL/min — AB (ref >60)
GFR, EST NON AFRICAN AMERICAN: 42 mL/min — AB (ref >60)
Glucose, Bld: 101 mg/dL — ABNORMAL HIGH (ref 65–99)
Potassium: 3.8 mmol/L (ref 3.5–5.1)
SODIUM: 131 mmol/L — AB (ref 135–145)

## 2017-03-04 LAB — SAMPLE TO BLOOD BANK

## 2017-03-04 MED ORDER — DIPHENHYDRAMINE HCL 25 MG PO CAPS
50.0000 mg | ORAL_CAPSULE | Freq: Once | ORAL | Status: AC
  Administered 2017-03-04: 50 mg via ORAL
  Filled 2017-03-04: qty 2

## 2017-03-04 MED ORDER — METHYLPREDNISOLONE SODIUM SUCC 125 MG IJ SOLR
60.0000 mg | Freq: Once | INTRAMUSCULAR | Status: AC
  Administered 2017-03-04: 60 mg via INTRAVENOUS
  Filled 2017-03-04: qty 2

## 2017-03-04 MED ORDER — ACETAMINOPHEN 325 MG PO TABS
650.0000 mg | ORAL_TABLET | Freq: Once | ORAL | Status: AC
  Administered 2017-03-04: 650 mg via ORAL
  Filled 2017-03-04: qty 2

## 2017-03-04 MED ORDER — SODIUM CHLORIDE 0.9 % IV SOLN
250.0000 mL | Freq: Once | INTRAVENOUS | Status: AC
  Administered 2017-03-04: 250 mL via INTRAVENOUS
  Filled 2017-03-04: qty 250

## 2017-03-04 NOTE — Assessment & Plan Note (Deleted)
#  Severe thrombocytopenia//anemia- highly suspicious for MDS vs ITP; however extensive workup negative for any obvious cause.  Today hemoglobin is 10; platelets 23 [status post transfusions last week]  #Repeat evaluation at [facility]; discussed with Dr.Julia Cunningham-question hypoplastic MDS.  Question benefit using cyclosporine plus ATG.  For now recommend increasing the dose of Promacta to 150 mg once a day.  Recommend taking 75 mg 2 pills once a day- starting today.   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 On Femara.  Await CT scan ordered today [see discussion below].   #Right lower lobe pneumonia/worsening cough-on Levaquin/prednisone; given the slight improvement of the symptoms-recommend CT scan of the chest stat today.  # 1 week/labs;Hold tube; CT scan STAT.   Addendum: Right middle lobe collapse-new.  Otherwise improved metastatic lung nodules. No other lung nodules. I spoke to pt.

## 2017-03-04 NOTE — Progress Notes (Signed)
[place] OFFICE PROGRESS NOTE  Patient Care Team: [name], MD as PCP - General (Family Medicine) [name], DPM as Consulting Physician (Podiatry) [name], MD as Consulting Physician (Internal Medicine) Robert Bellow, MD (General Surgery)   SUMMARY OF ONCOLOGIC HISTORY:  Oncology History   # April 2018- Left chest wall Bx- IMC; ER-PR-POS; her 2 Neu NEG; PET- mild hilar/distal adenopathy; ~1 cm right lung nodule/ several sub-centimeter.   # May 2018[facility]; Abemacliclib [on HOLD sec to cytopenia]  # April 2017-isolated Thrombocytopenia- platelets- 113; worsening PANCYTOPENIA- April 2018- BMBx- no evidence of malignancy; MDS/Acute leuk; Karyotype/MDS- FISH panel-NEG [d/w Dr.Smir];   # June 2018- REPEAT BMBx/[facility] [June 2018-Dr.Foster; Aug 2018- Dr.Powell at [facility]]; No Diagnosis.   # May 29th 2018- N-plate- suboptimal improvement; NOV 1st week start promacta 50 mg/day; suboptimal response; DEC 1st- start promacta 75 mg/day  # DEC 17th-INCREASE PROMACTA to 156m/day  # 2006- RIGHT BREAST CA [pT1cN0M0; STAGE I; ER/PRPos; Her 2 Neu-NEG] s/p FEC x 6 [NSABP B-36]; Femara [Dec 2007-2012]  # Osteoporosis s/p Reclast; BMD- 2015-wnl- ca+vit D   Dr.Stewart [derm ? Squamous cell s/p freeze-oct 2018]        Carcinoma of overlapping sites of right breast in female, estrogen receptor positive ([facility]    MDS (myelodysplastic syndrome) ([facility]     INTERVAL HISTORY:  A very pleasant 71year old female patient With newly diagnosed recurrent breast cancer- ER/PR positive HER-2/neu negative currently on Femara; And pancytopenia/ with more severe thrombocytopenia- currently Promacta is here for follow-up.  In the interim patient was evaluated last week because of worsening shortness of breath; and cough.  She has been treated with Levaquin and prednisone.  Patient finished Levaquin yesterday.  She had significant improvement of her symptoms.   She feels she is back to her baseline.  She denies any petechial rash on the legs. She denies any blood in stools black stools or bleeding gums.   She continues to take Femara. No weight loss.  Her appetite is fair.  REVIEW OF SYSTEMS:  A complete 10 point review of system is done which is negative except mentioned above/history of present illness.   PAST MEDICAL HISTORY :  Past Medical History:  Diagnosis Date  . Breast cancer ([place]    right, lumpectomy, radiation, chemo  . Breast cancer ([place]    Right, 2007  . Breast [place] ([place]   INVASIVE DUCTAL CARCINOMA.  . Headache   . History of chemotherapy   . History of radiation therapy   . Hyperlipidemia   . Hypertension   . Osteoarthritis    right hip  . Osteoporosis   . Squamous cell carcinoma    leg, Followed by Dr. SNicole Kindred   PAST SURGICAL HISTORY :   Past Surgical History:  Procedure Laterality Date  . ABDOMINAL HYSTERECTOMY    . BREAST BIOPSY Left 2002   left breast, calcifications  . BREAST BIOPSY Left 06/20/2016   INVASIVE DUCTAL CARCINOMA.  .Marland KitchenBREAST EXCISIONAL BIOPSY Right 2007   positive  . bunion repair    . ESOPHAGOGASTRODUODENOSCOPY (EGD) WITH PROPOFOL N/A 08/05/2016   Procedure: ESOPHAGOGASTRODUODENOSCOPY (EGD) WITH PROPOFOL;  Surgeon: SSan Jetty MD;  Location: [facility] ENDOSCOPY;  Service: General;  Laterality: N/A;  . left breast biopsy    . NASAL SINUS SURGERY    . right hip replacement    . SHOULDER SURGERY    . SQUAMOUS CELL CARCINOMA EXCISION     right leg, Dr.  Nicole Kindred  . TOTAL HIP ARTHROPLASTY     right    FAMILY HISTORY :   Family History  Problem Relation Age of Onset  . Heart disease Father 44  . Heart disease Mother   . Hyperlipidemia Mother   . Heart disease Brother   . Diabetes Brother   . Diabetes Maternal Grandmother   . Breast cancer Cousin   . Kidney disease Brother     SOCIAL HISTORY:   Social History   Tobacco Use  . Smoking status: Current Some Day Smoker     Packs/day: 0.10    Years: 30.00    Pack years: 3.00    Types: Cigarettes  . Smokeless tobacco: Never Used  Substance Use Topics  . Alcohol use: Yes    Alcohol/week: 0.0 oz    Comment: socially  . Drug use: No    ALLERGIES:  is allergic to codeine; fish-derived products; morphine sulfate; fish allergy; morphine sulfate; and oxycodone.  MEDICATIONS:  Current Outpatient Medications  Medication Sig Dispense Refill  . ALPRAZolam (XANAX) 0.5 MG tablet TAKE 1 TABLET BY MOUTH EVERY 8 HOURS AS NEEDED FOR ANXIETY OR SLEEP 30 tablet 2  . eltrombopag (PROMACTA) 75 MG tablet Take 2 tablets (150 mg total) by mouth daily. Take on an empty stomach 1 hour before meals or 2 hours after. 60 tablet 2  . ergocalciferol (VITAMIN D2) 50000 units capsule Take 1 capsule (50,000 Units total) by mouth once a week. 12 capsule 1  . Fexofenadine HCl (ALLEGRA PO) Take 1 capsule by mouth.    Marland Kitchen glucosamine-chondroitin 500-400 MG tablet Take 1 tablet by mouth daily.     Marland Kitchen letrozole (FEMARA) 2.5 MG tablet Take 1 tablet (2.5 mg total) by mouth daily. Once a day. 90 tablet 3  . metoprolol succinate (TOPROL-XL) 25 MG 24 hr tablet Take 1 tablet (25 mg total) by mouth daily. 90 tablet 3  . pravastatin (PRAVACHOL) 40 MG tablet Take 1 tablet (40 mg total) by mouth daily. 90 tablet 3  . Probiotic Product (PROBIOTIC & ACIDOPHILUS EX ST PO) Take 1 tablet by mouth daily.      No current facility-administered medications for this visit.     PHYSICAL EXAMINATION: ECOG PERFORMANCE STATUS: 0 - Asymptomatic  BP 130/75   Pulse 80   Temp (!) 97.5 F (36.4 C) (Tympanic)   Resp 20   Ht 5' 1"  (1.549 m)   Wt 138 lb 11.2 oz (62.9 kg)   BMI 26.21 kg/m   Filed Weights   03/04/17 0857  Weight: 138 lb 11.2 oz (62.9 kg)    GENERAL: Well-nourished well-developed; Alert, no distress and comfortable.  She is alone. EYES: WNL.  OROPHARYNX: no thrush or ulceration; good dentition. No bleeding noted. NECK: supple, no masses  felt LYMPH:  no palpable lymphadenopathy in the cervical, axillary or inguinal regions LUNGS: Decreased breath sounds to auscultation bilaterally and  No wheeze or crackles HEART/CVS: regular rate & rhythm and no murmurs; No lower extremity edema ABDOMEN:abdomen soft, non-tender and normal bowel sounds Musculoskeletal:no cyanosis of digits and no clubbing  PSYCH: alert & oriented x 3 with fluent speech NEURO: no focal motor/sensory deficits SKIN: Mild chronic ecchymosis.  Not any worse.   LABORATORY DATA:  I have reviewed the data as listed    Component Value Date/Time   NA 131 (L) 03/04/2017 0818   K 3.8 03/04/2017 0818   CL 100 (L) 03/04/2017 0818   CO2 24 03/04/2017 0818   GLUCOSE 101 (H)  03/04/2017 0818   BUN 17 03/04/2017 0818   CREATININE 1.26 (H) 03/04/2017 0818   CREATININE 0.67 07/09/2014 1321   CREATININE 0.70 04/30/2014 1455   CALCIUM 8.6 (L) 03/04/2017 0818   PROT 6.2 (L) 02/08/2017 0818   PROT 7.3 07/09/2014 1321   ALBUMIN 3.8 02/08/2017 0818   ALBUMIN 4.3 07/09/2014 1321   AST 18 02/08/2017 0818   AST 19 07/09/2014 1321   ALT 12 (L) 02/08/2017 0818   ALT 18 07/09/2014 1321   ALKPHOS 66 02/08/2017 0818   ALKPHOS 66 07/09/2014 1321   BILITOT 0.6 02/08/2017 0818   BILITOT 0.6 07/09/2014 1321   GFRNONAA 42 (L) 03/04/2017 0818   GFRNONAA >60 07/09/2014 1321   GFRAA 49 (L) 03/04/2017 0818   GFRAA >60 07/09/2014 1321    No results found for: SPEP, UPEP  Lab Results  Component Value Date   WBC 3.8 03/04/2017   NEUTROABS 2.0 03/04/2017   HGB 8.5 (L) 03/04/2017   HCT 23.9 (L) 03/04/2017   MCV 103.4 (H) 03/04/2017   PLT 13 (LL) 03/04/2017      Chemistry      Component Value Date/Time   NA 131 (L) 03/04/2017 0818   K 3.8 03/04/2017 0818   CL 100 (L) 03/04/2017 0818   CO2 24 03/04/2017 0818   BUN 17 03/04/2017 0818   CREATININE 1.26 (H) 03/04/2017 0818   CREATININE 0.67 07/09/2014 1321   CREATININE 0.70 04/30/2014 1455      Component Value Date/Time    CALCIUM 8.6 (L) 03/04/2017 0818   ALKPHOS 66 02/08/2017 0818   ALKPHOS 66 07/09/2014 1321   AST 18 02/08/2017 0818   AST 19 07/09/2014 1321   ALT 12 (L) 02/08/2017 0818   ALT 18 07/09/2014 1321   BILITOT 0.6 02/08/2017 0818   BILITOT 0.6 07/09/2014 1321        ASSESSMENT & PLAN:   MDS (myelodysplastic syndrome) (HCC) # Severe thrombocytopenia//anemia- highly suspicious for MDS vs ITP; however extensive workup negative for any obvious cause.  Today hemoglobin is 8.5; platelets 13.  Recommend platelet transfusion-based upon availability  # pt currently on increased dose of Promacta to 150 mg once a day.  No significant improvement noted in platelet count yet.  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 On Femara.  CT scan shows improving right upper lobe lung nodule.  #Right lower lobe pneumonia/-new right middle lobe atelectasis-likely benign; question mucous plugging.  Reviewed the tumor conference.   #Follow-up to be decided;   # I reviewed the blood work- with the patient in detail; also reviewed the imaging independently [as summarized above]; and with the patient in detail.       [name], MD 03/04/2017 9:57 AM

## 2017-03-04 NOTE — Progress Notes (Signed)
833 am - Received phone Call  From [facility] in cancer center lab - Critical plt count of 11.  Read back process performed. Dr. Rogue Bussing informed 835 am-of critical value-read back process performed with md.

## 2017-03-04 NOTE — Assessment & Plan Note (Addendum)
#  Severe thrombocytopenia//anemia- highly suspicious for MDS vs ITP; however extensive workup negative for any obvious cause.  Today hemoglobin is 8.5; platelets 13.  Recommend platelet transfusion-based upon availability  # pt currently on increased dose of Promacta to 150 mg once a day.  No significant improvement noted in platelet count yet.  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 On Femara.  CT scan shows improving right upper lobe lung nodule.  #Right lower lobe pneumonia/-new right middle lobe atelectasis-likely benign; question mucous plugging.  Reviewed the tumor conference.   #Follow-up to be decided;   # I reviewed the blood work- with the patient in detail; also reviewed the imaging independently [as summarized above]; and with the patient in detail.   Addendum: plan platelets today; weekly labs; follow up in 3weeks.

## 2017-03-05 LAB — BPAM PLATELET PHERESIS
BLOOD PRODUCT EXPIRATION DATE: 201812251009
ISSUE DATE / TIME: 201812241112
UNIT TYPE AND RH: 6200

## 2017-03-05 LAB — PREPARE PLATELET PHERESIS: Unit division: 0

## 2017-03-06 ENCOUNTER — Encounter: Payer: Self-pay | Admitting: Internal Medicine

## 2017-03-06 ENCOUNTER — Inpatient Hospital Stay: Payer: Medicare Other

## 2017-03-11 ENCOUNTER — Other Ambulatory Visit: Payer: Self-pay | Admitting: *Deleted

## 2017-03-11 ENCOUNTER — Telehealth: Payer: Self-pay | Admitting: *Deleted

## 2017-03-11 ENCOUNTER — Inpatient Hospital Stay: Payer: Medicare Other

## 2017-03-11 VITALS — BP 120/76 | HR 81 | Temp 97.5°F | Resp 18

## 2017-03-11 DIAGNOSIS — Z170 Estrogen receptor positive status [ER+]: Secondary | ICD-10-CM | POA: Diagnosis not present

## 2017-03-11 DIAGNOSIS — D693 Immune thrombocytopenic purpura: Secondary | ICD-10-CM

## 2017-03-11 DIAGNOSIS — C50811 Malignant neoplasm of overlapping sites of right female breast: Secondary | ICD-10-CM

## 2017-03-11 DIAGNOSIS — D696 Thrombocytopenia, unspecified: Secondary | ICD-10-CM

## 2017-03-11 DIAGNOSIS — D649 Anemia, unspecified: Secondary | ICD-10-CM | POA: Diagnosis not present

## 2017-03-11 DIAGNOSIS — R21 Rash and other nonspecific skin eruption: Secondary | ICD-10-CM | POA: Diagnosis not present

## 2017-03-11 DIAGNOSIS — D62 Acute posthemorrhagic anemia: Secondary | ICD-10-CM

## 2017-03-11 DIAGNOSIS — J189 Pneumonia, unspecified organism: Secondary | ICD-10-CM | POA: Diagnosis not present

## 2017-03-11 LAB — CBC WITH DIFFERENTIAL/PLATELET
Basophils Absolute: 0 10*3/uL (ref 0–0.1)
Basophils Relative: 0 %
EOS ABS: 0 10*3/uL (ref 0–0.7)
EOS PCT: 1 %
HCT: 20.9 % — ABNORMAL LOW (ref 35.0–47.0)
Hemoglobin: 7.2 g/dL — ABNORMAL LOW (ref 12.0–16.0)
LYMPHS ABS: 1.2 10*3/uL (ref 1.0–3.6)
Lymphocytes Relative: 52 %
MCH: 36.8 pg — AB (ref 26.0–34.0)
MCHC: 34.6 g/dL (ref 32.0–36.0)
MCV: 106.3 fL — ABNORMAL HIGH (ref 80.0–100.0)
Monocytes Absolute: 0.2 10*3/uL (ref 0.2–0.9)
Monocytes Relative: 7 %
Neutro Abs: 0.9 10*3/uL — ABNORMAL LOW (ref 1.4–6.5)
Neutrophils Relative %: 40 %
PLATELETS: 7 10*3/uL — AB (ref 150–400)
RBC: 1.97 MIL/uL — AB (ref 3.80–5.20)
RDW: 23.7 % — ABNORMAL HIGH (ref 11.5–14.5)
WBC: 2.3 10*3/uL — AB (ref 3.6–11.0)

## 2017-03-11 LAB — PNH PROFILE (-HIGH SENSITIVITY)

## 2017-03-11 LAB — SAMPLE TO BLOOD BANK

## 2017-03-11 MED ORDER — SODIUM CHLORIDE 0.9 % IV SOLN
250.0000 mL | Freq: Once | INTRAVENOUS | Status: AC
  Administered 2017-03-11: 250 mL via INTRAVENOUS
  Filled 2017-03-11: qty 250

## 2017-03-11 MED ORDER — ACETAMINOPHEN 325 MG PO TABS
650.0000 mg | ORAL_TABLET | Freq: Once | ORAL | Status: AC
  Administered 2017-03-11: 650 mg via ORAL
  Filled 2017-03-11: qty 2

## 2017-03-11 MED ORDER — DIPHENHYDRAMINE HCL 25 MG PO CAPS
50.0000 mg | ORAL_CAPSULE | Freq: Once | ORAL | Status: AC
  Administered 2017-03-11: 50 mg via ORAL
  Filled 2017-03-11: qty 2

## 2017-03-11 MED ORDER — CYANOCOBALAMIN 1000 MCG/ML IJ SOLN
1000.0000 ug | Freq: Once | INTRAMUSCULAR | Status: AC
  Administered 2017-03-11: 1000 ug via INTRAMUSCULAR
  Filled 2017-03-11: qty 1

## 2017-03-11 MED ORDER — METHYLPREDNISOLONE SODIUM SUCC 125 MG IJ SOLR
60.0000 mg | Freq: Once | INTRAMUSCULAR | Status: AC
  Administered 2017-03-11: 60 mg via INTRAVENOUS
  Filled 2017-03-11: qty 2

## 2017-03-11 MED ORDER — FAMOTIDINE IN NACL 20-0.9 MG/50ML-% IV SOLN
20.0000 mg | Freq: Once | INTRAVENOUS | Status: AC
  Administered 2017-03-11: 20 mg via INTRAVENOUS

## 2017-03-11 MED ORDER — ALBUTEROL SULFATE (2.5 MG/3ML) 0.083% IN NEBU
2.5000 mg | INHALATION_SOLUTION | Freq: Once | RESPIRATORY_TRACT | Status: AC
  Administered 2017-03-11: 2.5 mg via RESPIRATORY_TRACT

## 2017-03-11 MED ORDER — SODIUM CHLORIDE 0.9 % IV SOLN
Freq: Once | INTRAVENOUS | Status: AC
  Administered 2017-03-11: 15:00:00 via INTRAVENOUS
  Filled 2017-03-11: qty 1000

## 2017-03-11 NOTE — Telephone Encounter (Signed)
Lab cat count of 7, Immediately called results to infusion charge nurse Lora Paula, RN. Alease Medina, NP was also notified

## 2017-03-11 NOTE — Progress Notes (Signed)
1440- Patient coughing, wheezing, and states, "my chest feels tight. Also, my ears and throat are itching." Platelet transfusion completed at 1435, line flushing with 0.9% Sodium Chloride at this time. 0.9% Sodium Chloride infusion stopped and New 0.9% Sodium Chloride with IV tubing initiated. VSS. Beckey Rutter, NP, notified and present at chair side. NP also notified Dr. Rogue Bussing via telephone. Blood bank notified and transfusion reaction protocol initiated and followed. See blood administration flow sheet. Per NP, Beckey Rutter, order: 0.9% Sodium Chloride infusing at 934ml/hr for 1 hour. Pepcid 20mg  IVPB over 15 minutes once and Albuterol Nebulizer solution 2.5mg  once orders entered. See MAR.   4163- Hives present to bilateral upper extremities. Beckey Rutter, NP, still at chair side and aware. No new orders. Continue to monitor patient. Per NP order: Hold pRBC transfusion today. Patient to return to clinic Wednesday, 03/13/17, for pRBC transfusion. Patient educated and aware.  1625- Patient states, "I do not have any further chest tightness, wheezing, or itching and the hives on my arms have went down to where I can barely see them. I think I am ready to go home." Vital signs remain stable. NP, Beckey Rutter, notified via telephone and gave order that patient may be discharged to home at this time. Patient discharged and instructed to return to emergency department if any further issues occur.

## 2017-03-12 LAB — PREPARE PLATELET PHERESIS: UNIT DIVISION: 0

## 2017-03-12 LAB — BPAM PLATELET PHERESIS
BLOOD PRODUCT EXPIRATION DATE: 201812312359
ISSUE DATE / TIME: 201812311321
Unit Type and Rh: 7300

## 2017-03-13 ENCOUNTER — Inpatient Hospital Stay (HOSPITAL_BASED_OUTPATIENT_CLINIC_OR_DEPARTMENT_OTHER): Payer: Medicare Other | Admitting: Internal Medicine

## 2017-03-13 ENCOUNTER — Inpatient Hospital Stay: Payer: Medicare Other

## 2017-03-13 ENCOUNTER — Encounter: Payer: Self-pay | Admitting: Internal Medicine

## 2017-03-13 ENCOUNTER — Inpatient Hospital Stay: Payer: Medicare Other | Attending: Internal Medicine

## 2017-03-13 ENCOUNTER — Other Ambulatory Visit: Payer: Self-pay

## 2017-03-13 ENCOUNTER — Other Ambulatory Visit: Payer: Self-pay | Admitting: *Deleted

## 2017-03-13 VITALS — BP 163/80 | HR 92 | Temp 97.8°F | Resp 20 | Ht 61.0 in | Wt 139.5 lb

## 2017-03-13 DIAGNOSIS — R21 Rash and other nonspecific skin eruption: Secondary | ICD-10-CM | POA: Diagnosis not present

## 2017-03-13 DIAGNOSIS — E785 Hyperlipidemia, unspecified: Secondary | ICD-10-CM

## 2017-03-13 DIAGNOSIS — Z79899 Other long term (current) drug therapy: Secondary | ICD-10-CM | POA: Diagnosis not present

## 2017-03-13 DIAGNOSIS — R0602 Shortness of breath: Secondary | ICD-10-CM | POA: Diagnosis not present

## 2017-03-13 DIAGNOSIS — M1990 Unspecified osteoarthritis, unspecified site: Secondary | ICD-10-CM | POA: Insufficient documentation

## 2017-03-13 DIAGNOSIS — Z90710 Acquired absence of both cervix and uterus: Secondary | ICD-10-CM | POA: Diagnosis not present

## 2017-03-13 DIAGNOSIS — Z170 Estrogen receptor positive status [ER+]: Secondary | ICD-10-CM | POA: Diagnosis not present

## 2017-03-13 DIAGNOSIS — D469 Myelodysplastic syndrome, unspecified: Secondary | ICD-10-CM | POA: Diagnosis not present

## 2017-03-13 DIAGNOSIS — Z79811 Long term (current) use of aromatase inhibitors: Secondary | ICD-10-CM | POA: Diagnosis not present

## 2017-03-13 DIAGNOSIS — Z79818 Long term (current) use of other agents affecting estrogen receptors and estrogen levels: Secondary | ICD-10-CM

## 2017-03-13 DIAGNOSIS — Z803 Family history of malignant neoplasm of breast: Secondary | ICD-10-CM

## 2017-03-13 DIAGNOSIS — Z923 Personal history of irradiation: Secondary | ICD-10-CM

## 2017-03-13 DIAGNOSIS — C7801 Secondary malignant neoplasm of right lung: Secondary | ICD-10-CM | POA: Insufficient documentation

## 2017-03-13 DIAGNOSIS — C50811 Malignant neoplasm of overlapping sites of right female breast: Secondary | ICD-10-CM

## 2017-03-13 DIAGNOSIS — F17210 Nicotine dependence, cigarettes, uncomplicated: Secondary | ICD-10-CM | POA: Insufficient documentation

## 2017-03-13 DIAGNOSIS — I10 Essential (primary) hypertension: Secondary | ICD-10-CM

## 2017-03-13 DIAGNOSIS — Z9221 Personal history of antineoplastic chemotherapy: Secondary | ICD-10-CM | POA: Insufficient documentation

## 2017-03-13 DIAGNOSIS — M810 Age-related osteoporosis without current pathological fracture: Secondary | ICD-10-CM | POA: Diagnosis not present

## 2017-03-13 DIAGNOSIS — D696 Thrombocytopenia, unspecified: Secondary | ICD-10-CM

## 2017-03-13 DIAGNOSIS — D649 Anemia, unspecified: Secondary | ICD-10-CM

## 2017-03-13 LAB — PREPARE RBC (CROSSMATCH)

## 2017-03-13 LAB — CBC WITH DIFFERENTIAL/PLATELET
Basophils Absolute: 0 10*3/uL (ref 0–0.1)
Basophils Relative: 0 %
EOS PCT: 0 %
Eosinophils Absolute: 0 10*3/uL (ref 0–0.7)
HEMATOCRIT: 18.8 % — AB (ref 35.0–47.0)
Hemoglobin: 6.6 g/dL — ABNORMAL LOW (ref 12.0–16.0)
LYMPHS ABS: 1.1 10*3/uL (ref 1.0–3.6)
LYMPHS PCT: 31 %
MCH: 37 pg — AB (ref 26.0–34.0)
MCHC: 35.1 g/dL (ref 32.0–36.0)
MCV: 105.5 fL — AB (ref 80.0–100.0)
MONO ABS: 0.2 10*3/uL (ref 0.2–0.9)
Monocytes Relative: 6 %
NEUTROS ABS: 2.2 10*3/uL (ref 1.4–6.5)
Neutrophils Relative %: 63 %
PLATELETS: 41 10*3/uL — AB (ref 150–440)
RBC: 1.78 MIL/uL — AB (ref 3.80–5.20)
RDW: 23.6 % — AB (ref 11.5–14.5)
WBC: 3.5 10*3/uL — ABNORMAL LOW (ref 3.6–11.0)

## 2017-03-13 LAB — TYPE AND SCREEN
ABO/RH(D): O POS
Antibody Screen: POSITIVE
Unit division: 0

## 2017-03-13 LAB — BPAM RBC
Blood Product Expiration Date: 201901172359
UNIT TYPE AND RH: 5100

## 2017-03-13 LAB — SAMPLE TO BLOOD BANK

## 2017-03-13 MED ORDER — METHYLPREDNISOLONE SODIUM SUCC 125 MG IJ SOLR
40.0000 mg | Freq: Once | INTRAMUSCULAR | Status: AC
  Administered 2017-03-13: 40 mg via INTRAVENOUS
  Filled 2017-03-13: qty 2

## 2017-03-13 MED ORDER — ACETAMINOPHEN 325 MG PO TABS
650.0000 mg | ORAL_TABLET | Freq: Once | ORAL | Status: AC
  Administered 2017-03-13: 650 mg via ORAL
  Filled 2017-03-13: qty 2

## 2017-03-13 MED ORDER — DIPHENHYDRAMINE HCL 25 MG PO CAPS
50.0000 mg | ORAL_CAPSULE | Freq: Once | ORAL | Status: AC
  Administered 2017-03-13: 50 mg via ORAL
  Filled 2017-03-13: qty 2

## 2017-03-13 MED ORDER — SODIUM CHLORIDE 0.9 % IV SOLN
250.0000 mL | Freq: Once | INTRAVENOUS | Status: AC
  Administered 2017-03-13: 250 mL via INTRAVENOUS
  Filled 2017-03-13: qty 250

## 2017-03-13 NOTE — Progress Notes (Signed)
[facility] OFFICE PROGRESS NOTE  Patient Care Team: [name], MD as PCP - General (Family Medicine) [name], DPM as Consulting Physician (Podiatry) [name], MD as Consulting Physician (Internal Medicine) Robert Bellow, MD (General Surgery)   SUMMARY OF ONCOLOGIC HISTORY:  Oncology History   # April 2018- Left chest wall Bx- IMC; ER-PR-POS; her 2 Neu NEG; PET- mild hilar/distal adenopathy; ~1 cm right lung nodule/ several sub-centimeter.   # [facility]; Abemacliclib [on HOLD sec to cytopenia]  # April 2017-isolated Thrombocytopenia- platelets- 113; worsening PANCYTOPENIA- April 2018- BMBx- no evidence of malignancy; MDS/Acute leuk; Karyotype/MDS- FISH panel-NEG [d/w Dr.Smir];   # June 2018- REPEAT BMBx/[facility] [June 2018-Dr.Foster; Aug 2018- Dr.Powell at [facility]]; No Diagnosis.   # May 29th 2018- N-plate- suboptimal improvement; NOV 1st week start promacta 50 mg/day; suboptimal response; DEC 1st- start promacta 75 mg/day  # DEC 18th-INCREASE PROMACTA to 112m/day  # 2006- RIGHT BREAST CA [pT1cN0M0; STAGE I; ER/PRPos; Her 2 Neu-NEG] s/p FEC x 6 [NSABP B-36]; Femara [Dec 2007-2012]  # Osteoporosis s/p Reclast; BMD- 2015-wnl- ca+vit D   Dr.Stewart [derm ? Squamous cell s/p freeze-oct 2018]        Carcinoma of overlapping sites of right breast in female, estrogen receptor positive ([place]    MDS (myelodysplastic syndrome) ([facility]     INTERVAL HISTORY:  A very pleasant 72year old female patient With newly diagnosed recurrent breast cancer- ER/PR positive HER-2/neu negative currently on Femara; And pancytopenia/ with more severe thrombocytopenia- currently Promacta is here for follow-up.  Patient is currently on Promacta 150 mg a day since December 18.  She has not noted significant improvement of her platelets.  Her platelets earlier in the week were 7; she received platelet transfusion had a reaction needing steroids.  She  had significant improvement of her symptoms cough.  Complains of shortness of breath with exertion.  No sputum production.  She denies any petechial rash on the legs. She denies any blood in stools black stools or bleeding gums.   She continues to take Femara. No weight loss.  Her appetite is fair.  REVIEW OF SYSTEMS:  A complete 10 point review of system is done which is negative except mentioned above/history of present illness.   PAST MEDICAL HISTORY :  Past Medical History:  Diagnosis Date  . Breast cancer ([facility]    right, lumpectomy, radiation, chemo  . Breast cancer ([place]    Right, 2007  . Breast cancer ([facility] 06/20/2016   INVASIVE DUCTAL CARCINOMA.  . Headache   . History of chemotherapy   . History of radiation therapy   . Hyperlipidemia   . Hypertension   . Osteoarthritis    right hip  . Osteoporosis   . Squamous cell carcinoma    leg, Followed by Dr. SNicole Kindred   PAST SURGICAL HISTORY :   Past Surgical History:  Procedure Laterality Date  . ABDOMINAL HYSTERECTOMY    . BREAST BIOPSY Left 2002   left breast, calcifications  . BREAST BIOPSY Left 06/20/2016   INVASIVE DUCTAL CARCINOMA.  .Marland KitchenBREAST EXCISIONAL BIOPSY Right 2007   positive  . bunion repair    . ESOPHAGOGASTRODUODENOSCOPY (EGD) WITH PROPOFOL N/A 08/05/2016   Procedure: ESOPHAGOGASTRODUODENOSCOPY (EGD) WITH PROPOFOL;  Surgeon: SSan Jetty MD;  Location: [facility] ENDOSCOPY;  Service: General;  Laterality: N/A;  . left breast biopsy    . NASAL SINUS SURGERY    . right hip replacement    . SHOULDER SURGERY    .  SQUAMOUS CELL CARCINOMA EXCISION     right leg, Dr. Nicole Kindred  . TOTAL HIP ARTHROPLASTY     right    FAMILY HISTORY :   Family History  Problem Relation Age of Onset  . Heart disease Father 34  . Heart disease Mother   . Hyperlipidemia Mother   . Heart disease Brother   . Diabetes Brother   . Diabetes Maternal Grandmother   . Breast cancer Cousin   . Kidney disease Brother     SOCIAL  HISTORY:   Social History   Tobacco Use  . Smoking status: Current Some Day Smoker    Packs/day: 0.10    Years: 30.00    Pack years: 3.00    Types: Cigarettes  . Smokeless tobacco: Never Used  Substance Use Topics  . Alcohol use: Yes    Alcohol/week: 0.0 oz    Comment: socially  . Drug use: No    ALLERGIES:  is allergic to codeine; fish-derived products; morphine sulfate; fish allergy; morphine sulfate; and oxycodone.  MEDICATIONS:  Current Outpatient Medications  Medication Sig Dispense Refill  . ALPRAZolam (XANAX) 0.5 MG tablet TAKE 1 TABLET BY MOUTH EVERY 8 HOURS AS NEEDED FOR ANXIETY OR SLEEP 30 tablet 2  . eltrombopag (PROMACTA) 75 MG tablet Take 2 tablets (150 mg total) by mouth daily. Take on an empty stomach 1 hour before meals or 2 hours after. 60 tablet 2  . ergocalciferol (VITAMIN D2) 50000 units capsule Take 1 capsule (50,000 Units total) by mouth once a week. 12 capsule 1  . Fexofenadine HCl (ALLEGRA PO) Take 1 capsule by mouth.    Marland Kitchen glucosamine-chondroitin 500-400 MG tablet Take 1 tablet by mouth daily.     Marland Kitchen letrozole (FEMARA) 2.5 MG tablet Take 1 tablet (2.5 mg total) by mouth daily. Once a day. 90 tablet 3  . metoprolol succinate (TOPROL-XL) 25 MG 24 hr tablet Take 1 tablet (25 mg total) by mouth daily. 90 tablet 3  . pravastatin (PRAVACHOL) 40 MG tablet Take 1 tablet (40 mg total) by mouth daily. 90 tablet 3  . predniSONE (STERAPRED UNI-PAK 21 TAB) 10 MG (21) TBPK tablet As directed  0  . Probiotic Product (PROBIOTIC & ACIDOPHILUS EX ST PO) Take 1 tablet by mouth daily.      No current facility-administered medications for this visit.     PHYSICAL EXAMINATION: ECOG PERFORMANCE STATUS: 0 - Asymptomatic  BP (!) 163/80 (BP Location: Left Arm, Patient Position: Sitting)   Pulse 92   Temp 97.8 F (36.6 C) (Oral)   Resp 20   Ht _0  (1.549 m)   Wt 139 lb 8 oz (63.3 kg)   SpO2 97%   BMI 26.36 kg/m   Filed Weights   03/13/17 1026  Weight: 139 lb 8 oz  (63.3 kg)    GENERAL: Well-nourished well-developed; Alert, no distress and comfortable.  She is alone. EYES: WNL.  OROPHARYNX: no thrush or ulceration; good dentition. No bleeding noted. NECK: supple, no masses felt LYMPH:  no palpable lymphadenopathy in the cervical, axillary or inguinal regions LUNGS: Decreased breath sounds to auscultation bilaterally and  No wheeze or crackles HEART/CVS: regular rate & rhythm and no murmurs; No lower extremity edema ABDOMEN:abdomen soft, non-tender and normal bowel sounds Musculoskeletal:no cyanosis of digits and no clubbing  PSYCH: alert & oriented x 3 with fluent speech NEURO: no focal motor/sensory deficits SKIN: Mild chronic ecchymosis.  Not any worse.   LABORATORY DATA:  I have reviewed the data as  listed    Component Value Date/Time   NA 131 (L) 03/04/2017 0818   K 3.8 03/04/2017 0818   CL 100 (L) 03/04/2017 0818   CO2 24 03/04/2017 0818   GLUCOSE 101 (H) 03/04/2017 0818   BUN 17 03/04/2017 0818   CREATININE 1.26 (H) 03/04/2017 0818   CREATININE 0.67 07/09/2014 1321   CREATININE 0.70 04/30/2014 1455   CALCIUM 8.6 (L) 03/04/2017 0818   PROT 6.2 (L) 02/08/2017 0818   PROT 7.3 07/09/2014 1321   ALBUMIN 3.8 02/08/2017 0818   ALBUMIN 4.3 07/09/2014 1321   AST 18 02/08/2017 0818   AST 19 07/09/2014 1321   ALT 12 (L) 02/08/2017 0818   ALT 18 07/09/2014 1321   ALKPHOS 66 02/08/2017 0818   ALKPHOS 66 07/09/2014 1321   BILITOT 0.6 02/08/2017 0818   BILITOT 0.6 07/09/2014 1321   GFRNONAA 42 (L) 03/04/2017 0818   GFRNONAA >60 07/09/2014 1321   GFRAA 49 (L) 03/04/2017 0818   GFRAA >60 07/09/2014 1321    No results found for: SPEP, UPEP  Lab Results  Component Value Date   WBC 3.5 (L) 03/13/2017   NEUTROABS 2.2 03/13/2017   HGB 6.6 (L) 03/13/2017   HCT 18.8 (L) 03/13/2017   MCV 105.5 (H) 03/13/2017   PLT 41 (L) 03/13/2017      Chemistry      Component Value Date/Time   NA 131 (L) 03/04/2017 0818   K 3.8 03/04/2017 0818    CL 100 (L) 03/04/2017 0818   CO2 24 03/04/2017 0818   BUN 17 03/04/2017 0818   CREATININE 1.26 (H) 03/04/2017 0818   CREATININE 0.67 07/09/2014 1321   CREATININE 0.70 04/30/2014 1455      Component Value Date/Time   CALCIUM 8.6 (L) 03/04/2017 0818   ALKPHOS 66 02/08/2017 0818   ALKPHOS 66 07/09/2014 1321   AST 18 02/08/2017 0818   AST 19 07/09/2014 1321   ALT 12 (L) 02/08/2017 0818   ALT 18 07/09/2014 1321   BILITOT 0.6 02/08/2017 0818   BILITOT 0.6 07/09/2014 1321        ASSESSMENT & PLAN:   MDS (myelodysplastic syndrome) (HCC) # Severe thrombocytopenia//anemia- highly suspicious for MDS vs ITP; however extensive workup negative for any obvious cause.  Today hemoglobin is 6.6 platelets 41.  Recommend PRBC tranfsusion.   # pt currently on increased dose of Promacta to 150 mg once a day.  No significant improvement noted in platelet count yet.  PNH panel shows a small clone-question significance.  Will discuss with [facility] Dr. Royce Macadamia.  Question of the need for repeat bone marrow biopsy last bone marrow biopsy was approximately 6 months ago.  Discussed with the patient.  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 On Femara.  CT scan shows improving right upper lobe lung nodule.  #Right lower lobe pneumonia/-new right middle lobe atelectasis-likely benign; question mucous plugging. CXR- in 1 week  # weekly cbc/hold tube/platelets; follow up in 3 weeks/MD. Will discuss with Dr.Foster.      [name], MD 03/13/2017 4:12 PM

## 2017-03-13 NOTE — Progress Notes (Signed)
Pt reports extreme fatigue with ADLs. hgb 6.6.

## 2017-03-13 NOTE — Assessment & Plan Note (Addendum)
#  Severe thrombocytopenia//anemia- highly suspicious for MDS vs ITP; however extensive workup negative for any obvious cause.  Today hemoglobin is 6.6 platelets 41.  Recommend PRBC tranfsusion.   # pt currently on increased dose of Promacta to 150 mg once a day.  No significant improvement noted in platelet count yet.  PNH panel shows a small clone-question significance.  Will discuss with [facility] Dr. Royce Macadamia.  Question of the need for repeat bone marrow biopsy last bone marrow biopsy was approximately 6 months ago.  Discussed with the patient.  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 On Femara.  CT scan shows improving right upper lobe lung nodule.  #Right lower lobe pneumonia/-new right middle lobe atelectasis-likely benign; question mucous plugging. CXR- in 1 week  # weekly cbc/hold tube/platelets; follow up in 3 weeks/MD. Will discuss with Dr.Foster.

## 2017-03-14 LAB — TYPE AND SCREEN
ABO/RH(D): O POS
Antibody Screen: NEGATIVE
Donor AG Type: NEGATIVE
UNIT DIVISION: 0

## 2017-03-14 LAB — BPAM RBC
BLOOD PRODUCT EXPIRATION DATE: 201901172359
ISSUE DATE / TIME: 201901021324
Unit Type and Rh: 5100

## 2017-03-15 ENCOUNTER — Ambulatory Visit
Admission: RE | Admit: 2017-03-15 | Discharge: 2017-03-15 | Disposition: A | Discharge status: Home / Self Care | Payer: Medicare Other | Source: Ambulatory Visit | Attending: Internal Medicine | Admitting: Internal Medicine

## 2017-03-15 DIAGNOSIS — Z8701 Personal history of pneumonia (recurrent): Secondary | ICD-10-CM | POA: Diagnosis not present

## 2017-03-15 DIAGNOSIS — R0602 Shortness of breath: Secondary | ICD-10-CM | POA: Insufficient documentation

## 2017-03-16 LAB — CULTURE, BLOOD (ROUTINE X 2)
Culture: NO GROWTH
Special Requests: ADEQUATE

## 2017-03-18 ENCOUNTER — Inpatient Hospital Stay: Payer: Medicare Other

## 2017-03-18 ENCOUNTER — Other Ambulatory Visit: Payer: Self-pay | Admitting: *Deleted

## 2017-03-18 DIAGNOSIS — D693 Immune thrombocytopenic purpura: Secondary | ICD-10-CM

## 2017-03-18 DIAGNOSIS — C7801 Secondary malignant neoplasm of right lung: Secondary | ICD-10-CM | POA: Diagnosis not present

## 2017-03-18 DIAGNOSIS — C50811 Malignant neoplasm of overlapping sites of right female breast: Secondary | ICD-10-CM

## 2017-03-18 DIAGNOSIS — Z170 Estrogen receptor positive status [ER+]: Secondary | ICD-10-CM | POA: Diagnosis not present

## 2017-03-18 DIAGNOSIS — D62 Acute posthemorrhagic anemia: Secondary | ICD-10-CM

## 2017-03-18 DIAGNOSIS — Z79818 Long term (current) use of other agents affecting estrogen receptors and estrogen levels: Secondary | ICD-10-CM | POA: Diagnosis not present

## 2017-03-18 DIAGNOSIS — D696 Thrombocytopenia, unspecified: Secondary | ICD-10-CM

## 2017-03-18 DIAGNOSIS — D469 Myelodysplastic syndrome, unspecified: Secondary | ICD-10-CM | POA: Diagnosis not present

## 2017-03-18 DIAGNOSIS — R21 Rash and other nonspecific skin eruption: Secondary | ICD-10-CM | POA: Diagnosis not present

## 2017-03-18 LAB — CBC WITH DIFFERENTIAL/PLATELET
BASOS ABS: 0 10*3/uL (ref 0–0.1)
BASOS PCT: 0 %
Eosinophils Absolute: 0 10*3/uL (ref 0–0.7)
Eosinophils Relative: 1 %
HEMATOCRIT: 24.9 % — AB (ref 35.0–47.0)
HEMOGLOBIN: 8.7 g/dL — AB (ref 12.0–16.0)
LYMPHS PCT: 52 %
Lymphs Abs: 1.3 10*3/uL (ref 1.0–3.6)
MCH: 34.6 pg — ABNORMAL HIGH (ref 26.0–34.0)
MCHC: 34.9 g/dL (ref 32.0–36.0)
MCV: 99.1 fL (ref 80.0–100.0)
MONO ABS: 0.2 10*3/uL (ref 0.2–0.9)
MONOS PCT: 7 %
NEUTROS ABS: 1 10*3/uL — AB (ref 1.4–6.5)
NEUTROS PCT: 40 %
Platelets: 9 10*3/uL — CL (ref 150–400)
RBC: 2.51 MIL/uL — ABNORMAL LOW (ref 3.80–5.20)
RDW: 23.5 % — AB (ref 11.5–14.5)
WBC: 2.5 10*3/uL — ABNORMAL LOW (ref 3.6–11.0)

## 2017-03-18 LAB — SAMPLE TO BLOOD BANK

## 2017-03-18 MED ORDER — METHYLPREDNISOLONE SODIUM SUCC 125 MG IJ SOLR
60.0000 mg | Freq: Once | INTRAMUSCULAR | Status: AC
  Administered 2017-03-18: 60 mg via INTRAVENOUS
  Filled 2017-03-18: qty 2

## 2017-03-18 MED ORDER — DIPHENHYDRAMINE HCL 25 MG PO CAPS
50.0000 mg | ORAL_CAPSULE | Freq: Once | ORAL | Status: AC
  Administered 2017-03-18: 50 mg via ORAL
  Filled 2017-03-18: qty 2

## 2017-03-18 MED ORDER — ACETAMINOPHEN 325 MG PO TABS
650.0000 mg | ORAL_TABLET | Freq: Once | ORAL | Status: AC
  Administered 2017-03-18: 650 mg via ORAL
  Filled 2017-03-18: qty 2

## 2017-03-18 MED ORDER — SODIUM CHLORIDE 0.9 % IV SOLN
250.0000 mL | Freq: Once | INTRAVENOUS | Status: AC
  Administered 2017-03-18: 250 mL via INTRAVENOUS
  Filled 2017-03-18: qty 250

## 2017-03-19 LAB — PREPARE PLATELET PHERESIS: UNIT DIVISION: 0

## 2017-03-19 LAB — BPAM PLATELET PHERESIS
Blood Product Expiration Date: 201901092359
ISSUE DATE / TIME: 201901071359
Unit Type and Rh: 9500

## 2017-03-27 ENCOUNTER — Other Ambulatory Visit: Payer: Self-pay | Admitting: *Deleted

## 2017-03-27 ENCOUNTER — Inpatient Hospital Stay (HOSPITAL_BASED_OUTPATIENT_CLINIC_OR_DEPARTMENT_OTHER): Payer: Medicare Other | Admitting: Internal Medicine

## 2017-03-27 ENCOUNTER — Inpatient Hospital Stay: Payer: Medicare Other

## 2017-03-27 VITALS — BP 116/69 | HR 104 | Temp 97.4°F | Resp 16 | Wt 139.4 lb

## 2017-03-27 DIAGNOSIS — Z170 Estrogen receptor positive status [ER+]: Secondary | ICD-10-CM | POA: Diagnosis not present

## 2017-03-27 DIAGNOSIS — M810 Age-related osteoporosis without current pathological fracture: Secondary | ICD-10-CM | POA: Diagnosis not present

## 2017-03-27 DIAGNOSIS — Z79899 Other long term (current) drug therapy: Secondary | ICD-10-CM | POA: Diagnosis not present

## 2017-03-27 DIAGNOSIS — D62 Acute posthemorrhagic anemia: Secondary | ICD-10-CM

## 2017-03-27 DIAGNOSIS — D696 Thrombocytopenia, unspecified: Secondary | ICD-10-CM

## 2017-03-27 DIAGNOSIS — E785 Hyperlipidemia, unspecified: Secondary | ICD-10-CM | POA: Diagnosis not present

## 2017-03-27 DIAGNOSIS — Z79811 Long term (current) use of aromatase inhibitors: Secondary | ICD-10-CM | POA: Diagnosis not present

## 2017-03-27 DIAGNOSIS — C50811 Malignant neoplasm of overlapping sites of right female breast: Secondary | ICD-10-CM | POA: Diagnosis not present

## 2017-03-27 DIAGNOSIS — R21 Rash and other nonspecific skin eruption: Secondary | ICD-10-CM

## 2017-03-27 DIAGNOSIS — I10 Essential (primary) hypertension: Secondary | ICD-10-CM

## 2017-03-27 DIAGNOSIS — Z803 Family history of malignant neoplasm of breast: Secondary | ICD-10-CM

## 2017-03-27 DIAGNOSIS — D649 Anemia, unspecified: Secondary | ICD-10-CM

## 2017-03-27 DIAGNOSIS — D469 Myelodysplastic syndrome, unspecified: Secondary | ICD-10-CM

## 2017-03-27 DIAGNOSIS — D693 Immune thrombocytopenic purpura: Secondary | ICD-10-CM

## 2017-03-27 DIAGNOSIS — F17210 Nicotine dependence, cigarettes, uncomplicated: Secondary | ICD-10-CM

## 2017-03-27 DIAGNOSIS — Z9221 Personal history of antineoplastic chemotherapy: Secondary | ICD-10-CM

## 2017-03-27 DIAGNOSIS — R0602 Shortness of breath: Secondary | ICD-10-CM | POA: Diagnosis not present

## 2017-03-27 DIAGNOSIS — C7801 Secondary malignant neoplasm of right lung: Secondary | ICD-10-CM

## 2017-03-27 DIAGNOSIS — Z79818 Long term (current) use of other agents affecting estrogen receptors and estrogen levels: Secondary | ICD-10-CM | POA: Diagnosis not present

## 2017-03-27 DIAGNOSIS — Z90710 Acquired absence of both cervix and uterus: Secondary | ICD-10-CM

## 2017-03-27 DIAGNOSIS — Z923 Personal history of irradiation: Secondary | ICD-10-CM

## 2017-03-27 DIAGNOSIS — M1990 Unspecified osteoarthritis, unspecified site: Secondary | ICD-10-CM

## 2017-03-27 LAB — CBC WITH DIFFERENTIAL/PLATELET
BASOS PCT: 0 %
Basophils Absolute: 0 10*3/uL (ref 0.0–0.1)
Eosinophils Absolute: 0 10*3/uL (ref 0.0–0.7)
Eosinophils Relative: 1 %
HEMATOCRIT: 21.5 % — AB (ref 36.0–46.0)
HEMOGLOBIN: 7.4 g/dL — AB (ref 12.0–15.0)
LYMPHS ABS: 1.1 10*3/uL (ref 0.7–4.0)
LYMPHS PCT: 53 %
MCH: 34.4 pg — ABNORMAL HIGH (ref 26.0–34.0)
MCHC: 34.7 g/dL (ref 30.0–36.0)
MCV: 99.1 fL (ref 78.0–100.0)
MONOS PCT: 6 %
Monocytes Absolute: 0.1 10*3/uL (ref 0.1–1.0)
NEUTROS ABS: 0.8 10*3/uL — AB (ref 1.7–7.7)
NEUTROS PCT: 41 %
Platelets: 7 10*3/uL — CL (ref 150–400)
RBC: 2.16 MIL/uL — ABNORMAL LOW (ref 3.87–5.11)
RDW: 23.8 % — ABNORMAL HIGH (ref 11.5–15.5)
WBC: 2 10*3/uL — ABNORMAL LOW (ref 4.0–10.5)

## 2017-03-27 LAB — SAMPLE TO BLOOD BANK

## 2017-03-27 LAB — PREPARE RBC (CROSSMATCH)

## 2017-03-27 LAB — TRANSFUSION REACTION
DAT C3: NEGATIVE
Post RXN DAT IgG: NEGATIVE

## 2017-03-27 MED ORDER — DIPHENHYDRAMINE HCL 50 MG/ML IJ SOLN
INTRAMUSCULAR | Status: AC
  Filled 2017-03-27: qty 1

## 2017-03-27 MED ORDER — ACETAMINOPHEN 325 MG PO TABS
650.0000 mg | ORAL_TABLET | Freq: Once | ORAL | Status: AC
  Administered 2017-03-27: 650 mg via ORAL
  Filled 2017-03-27: qty 2

## 2017-03-27 MED ORDER — FAMOTIDINE IN NACL 20-0.9 MG/50ML-% IV SOLN
20.0000 mg | Freq: Once | INTRAVENOUS | Status: AC
  Administered 2017-03-27: 20 mg via INTRAVENOUS

## 2017-03-27 MED ORDER — METHYLPREDNISOLONE SODIUM SUCC 125 MG IJ SOLR
60.0000 mg | Freq: Once | INTRAMUSCULAR | Status: AC
  Administered 2017-03-27: 60 mg via INTRAVENOUS
  Filled 2017-03-27: qty 2

## 2017-03-27 MED ORDER — DIPHENHYDRAMINE HCL 25 MG PO CAPS
50.0000 mg | ORAL_CAPSULE | Freq: Once | ORAL | Status: AC
  Administered 2017-03-27: 50 mg via ORAL
  Filled 2017-03-27: qty 2

## 2017-03-27 MED ORDER — ACETAMINOPHEN 325 MG PO TABS
650.0000 mg | ORAL_TABLET | Freq: Once | ORAL | Status: AC
  Administered 2017-03-27: 650 mg via ORAL

## 2017-03-27 MED ORDER — ACETAMINOPHEN 325 MG PO TABS
ORAL_TABLET | ORAL | Status: AC
  Filled 2017-03-27: qty 2

## 2017-03-27 MED ORDER — SODIUM CHLORIDE 0.9 % IV SOLN
250.0000 mL | Freq: Once | INTRAVENOUS | Status: AC
  Administered 2017-03-27: 250 mL via INTRAVENOUS
  Filled 2017-03-27: qty 250

## 2017-03-27 MED ORDER — METHYLPREDNISOLONE SODIUM SUCC 125 MG IJ SOLR
INTRAMUSCULAR | Status: AC
Start: 2017-03-27 — End: 2017-03-27
  Filled 2017-03-27: qty 2

## 2017-03-27 MED ORDER — DIPHENHYDRAMINE HCL 50 MG/ML IJ SOLN
25.0000 mg | Freq: Once | INTRAMUSCULAR | Status: AC
  Administered 2017-03-27: 25 mg via INTRAVENOUS

## 2017-03-27 MED ORDER — METHYLPREDNISOLONE SODIUM SUCC 125 MG IJ SOLR
60.0000 mg | Freq: Once | INTRAMUSCULAR | Status: AC
  Administered 2017-03-27: 60 mg via INTRAVENOUS

## 2017-03-27 NOTE — Progress Notes (Signed)
[facility] [place] OFFICE PROGRESS NOTE  Patient Care Team: [name], MD as PCP - General (Family Medicine) [name], DPM as Consulting Physician (Podiatry) [name], MD as Consulting Physician (Internal Medicine) Robert Bellow, MD (General Surgery)   SUMMARY OF ONCOLOGIC HISTORY:  Oncology History   # April 2018- Left chest wall Bx- IMC; ER-PR-POS; her 2 Neu NEG; PET- mild hilar/distal adenopathy; ~1 cm right lung nodule/ several sub-centimeter.   # [facility]; Abemacliclib [on HOLD sec to cytopenia]  # April 2017-isolated Thrombocytopenia- platelets- 113; worsening PANCYTOPENIA- April 2018- BMBx- no evidence of malignancy; MDS/Acute leuk; Karyotype/MDS- FISH panel-NEG [d/w Dr.Smir];   # June 2018- REPEAT BMBx/[facility] [June 2018-Dr.Foster; Aug 2018- Dr.Powell at [facility]]; No Diagnosis.   # May 29th 2018- N-plate- suboptimal improvement; NOV 1st week start promacta 50 mg/day; suboptimal response; DEC 1st- start promacta 75 mg/day  # DEC 18th-INCREASE PROMACTA to 111m/day  # 2006- RIGHT BREAST CA [pT1cN0M0; STAGE I; ER/PRPos; Her 2 Neu-NEG] s/p FEC x 6 [NSABP B-36]; Femara [Dec 2007-2012]  # Osteoporosis s/p Reclast; BMD- 2015-wnl- ca+vit D   Dr.Stewart [derm ? Squamous cell s/p freeze-oct 2018]        Carcinoma of overlapping sites of right breast in female, estrogen receptor positive ([facility]    MDS (myelodysplastic syndrome) ([facility]     INTERVAL HISTORY:  A very pleasant 72year old female patient With newly diagnosed recurrent breast cancer- ER/PR positive HER-2/neu negative currently on Femara; And pancytopenia/ with more severe thrombocytopenia- currently Promacta is here for follow-up.  Patient is currently on Promacta 150 mg a day since December 18.  She has not noted significant improvement of her platelets.   She had significant improvement of her symptoms cough.  Complains of shortness of breath with exertion.  No  sputum production.  She denies any petechial rash on the legs. She denies any blood in stools black stools or bleeding gums.   She continues to take Femara. No weight loss.  Her appetite is fair.  REVIEW OF SYSTEMS:  A complete 10 point review of system is done which is negative except mentioned above/history of present illness.   PAST MEDICAL HISTORY :  Past Medical History:  Diagnosis Date  . Breast cancer ([facility]    right, lumpectomy, radiation, chemo  . Breast cancer ([facility]    Right, 2007  . Breast cancer ([facility] 06/20/2016   INVASIVE DUCTAL CARCINOMA.  . Headache   . History of chemotherapy   . History of radiation therapy   . Hyperlipidemia   . Hypertension   . Osteoarthritis    right hip  . Osteoporosis   . Squamous cell carcinoma    leg, Followed by Dr. SNicole Kindred   PAST SURGICAL HISTORY :   Past Surgical History:  Procedure Laterality Date  . ABDOMINAL HYSTERECTOMY    . BREAST BIOPSY Left 2002   left breast, calcifications  . BREAST BIOPSY Left 06/20/2016   INVASIVE DUCTAL CARCINOMA.  .Marland KitchenBREAST EXCISIONAL BIOPSY Right 2007   positive  . bunion repair    . ESOPHAGOGASTRODUODENOSCOPY (EGD) WITH PROPOFOL N/A 08/05/2016   Procedure: ESOPHAGOGASTRODUODENOSCOPY (EGD) WITH PROPOFOL;  Surgeon: SSan Jetty MD;  Location: [facility] ENDOSCOPY;  Service: General;  Laterality: N/A;  . left breast biopsy    . NASAL SINUS SURGERY    . right hip replacement    . SHOULDER SURGERY    . SQUAMOUS CELL CARCINOMA EXCISION     right leg, Dr. SNicole Kindred .  TOTAL HIP ARTHROPLASTY     right    FAMILY HISTORY :   Family History  Problem Relation Age of Onset  . Heart disease Father 72  . Heart disease Mother   . Hyperlipidemia Mother   . Heart disease Brother   . Diabetes Brother   . Diabetes Maternal Grandmother   . Breast cancer Cousin   . Kidney disease Brother     SOCIAL HISTORY:   Social History   Tobacco Use  . Smoking status: Current Some Day Smoker    Packs/day: 0.10     Years: 30.00    Pack years: 3.00    Types: Cigarettes  . Smokeless tobacco: Never Used  Substance Use Topics  . Alcohol use: Yes    Alcohol/week: 0.0 oz    Comment: socially  . Drug use: No    ALLERGIES:  is allergic to codeine; fish-derived products; morphine sulfate; and oxycodone.  MEDICATIONS:  Current Outpatient Medications  Medication Sig Dispense Refill  . ALPRAZolam (XANAX) 0.5 MG tablet TAKE 1 TABLET BY MOUTH EVERY 8 HOURS AS NEEDED FOR ANXIETY OR SLEEP (Patient taking differently: take 1 tablet by mouth at bedtime) 30 tablet 2  . ergocalciferol (VITAMIN D2) 50000 units capsule Take 1 capsule (50,000 Units total) by mouth once a week. (Patient taking differently: Take 50,000 Units by mouth every Saturday. In the morning.) 12 capsule 1  . letrozole (FEMARA) 2.5 MG tablet Take 1 tablet (2.5 mg total) by mouth daily. Once a day. 90 tablet 3  . metoprolol succinate (TOPROL-XL) 25 MG 24 hr tablet Take 1 tablet (25 mg total) by mouth daily. 90 tablet 3  . pravastatin (PRAVACHOL) 40 MG tablet Take 1 tablet (40 mg total) by mouth daily. (Patient taking differently: Take 40 mg by mouth at bedtime. ) 90 tablet 3  . Probiotic Product (PROBIOTIC & ACIDOPHILUS EX ST PO) Take 1 tablet by mouth daily.     . cyanocobalamin (,VITAMIN B-12,) 1000 MCG/ML injection Inject 1,000 mcg into the muscle every 30 (thirty) days.    . fexofenadine (ALLEGRA) 180 MG tablet Take 180 mg by mouth daily.    . Glucosamine-Chondroitin (COSAMIN DS PO) Take 1 tablet by mouth daily. In the morning.    . neomycin-polymyxin-pramoxine (NEOSPORIN PLUS) 1 % cream Apply 1 application topically 2 (two) times daily as needed (Nasal passages as needed for dryness.).    Marland Kitchen ranitidine (ZANTAC) 75 MG tablet Take 75 mg by mouth daily.    . vitamin B-12 (CYANOCOBALAMIN) 1000 MCG tablet Take 1,000 mcg by mouth daily.     No current facility-administered medications for this visit.     PHYSICAL EXAMINATION: ECOG PERFORMANCE  STATUS: 0 - Asymptomatic  BP 116/69 (BP Location: Left Arm, Patient Position: Sitting)   Pulse (!) 104   Temp (!) 97.4 F (36.3 C) (Tympanic)   Resp 16   Wt 139 lb 6.4 oz (63.2 kg)   BMI 26.34 kg/m   Filed Weights   03/27/17 0927  Weight: 139 lb 6.4 oz (63.2 kg)    GENERAL: Well-nourished well-developed; Alert, no distress and comfortable.  She is alone. EYES: WNL.  OROPHARYNX: no thrush or ulceration; good dentition. No bleeding noted. NECK: supple, no masses felt LYMPH:  no palpable lymphadenopathy in the cervical, axillary or inguinal regions LUNGS: Decreased breath sounds to auscultation bilaterally and  No wheeze or crackles HEART/CVS: regular rate & rhythm and no murmurs; No lower extremity edema ABDOMEN:abdomen soft, non-tender and normal bowel sounds Musculoskeletal:no cyanosis  of digits and no clubbing  PSYCH: alert & oriented x 3 with fluent speech NEURO: no focal motor/sensory deficits SKIN: Mild chronic ecchymosis.  Not any worse.   LABORATORY DATA:  I have reviewed the data as listed    Component Value Date/Time   NA 131 (L) 03/04/2017 0818   K 3.8 03/04/2017 0818   CL 100 (L) 03/04/2017 0818   CO2 24 03/04/2017 0818   GLUCOSE 101 (H) 03/04/2017 0818   BUN 17 03/04/2017 0818   CREATININE 1.26 (H) 03/04/2017 0818   CREATININE 0.67 07/09/2014 1321   CREATININE 0.70 04/30/2014 1455   CALCIUM 8.6 (L) 03/04/2017 0818   PROT 6.2 (L) 02/08/2017 0818   PROT 7.3 07/09/2014 1321   ALBUMIN 3.8 02/08/2017 0818   ALBUMIN 4.3 07/09/2014 1321   AST 18 02/08/2017 0818   AST 19 07/09/2014 1321   ALT 12 (L) 02/08/2017 0818   ALT 18 07/09/2014 1321   ALKPHOS 66 02/08/2017 0818   ALKPHOS 66 07/09/2014 1321   BILITOT 0.6 02/08/2017 0818   BILITOT 0.6 07/09/2014 1321   GFRNONAA 42 (L) 03/04/2017 0818   GFRNONAA >60 07/09/2014 1321   GFRAA 49 (L) 03/04/2017 0818   GFRAA >60 07/09/2014 1321    No results found for: SPEP, UPEP  Lab Results  Component Value Date    WBC 3.8 04/08/2017   NEUTROABS 1.7 04/08/2017   HGB 7.5 (L) 04/08/2017   HCT 21.5 (L) 04/08/2017   MCV 95.0 04/08/2017   PLT 28 (LL) 04/08/2017      Chemistry      Component Value Date/Time   NA 131 (L) 03/04/2017 0818   K 3.8 03/04/2017 0818   CL 100 (L) 03/04/2017 0818   CO2 24 03/04/2017 0818   BUN 17 03/04/2017 0818   CREATININE 1.26 (H) 03/04/2017 0818   CREATININE 0.67 07/09/2014 1321   CREATININE 0.70 04/30/2014 1455      Component Value Date/Time   CALCIUM 8.6 (L) 03/04/2017 0818   ALKPHOS 66 02/08/2017 0818   ALKPHOS 66 07/09/2014 1321   AST 18 02/08/2017 0818   AST 19 07/09/2014 1321   ALT 12 (L) 02/08/2017 0818   ALT 18 07/09/2014 1321   BILITOT 0.6 02/08/2017 0818   BILITOT 0.6 07/09/2014 1321        ASSESSMENT & PLAN:   MDS (myelodysplastic syndrome) ([facility]) # Severe thrombocytopenia//anemia- highly suspicious for MDS.  Given the increasing platelet transfusion requirements/worsening anemia-recommend a repeat bone marrow biopsy with foundation 1 testing.  Also repeat [facility] panel-given the recent small clone noted.   # stop Promacta given the lack of significant improvement;   #Skin rash-secondary to platelet transfusion recommend washed platelets/also suspect alloimmunization recommend crossmatch platelets.  Also discussed with Dr. Royce Macadamia.  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 On Femara.  CT scan shows improving right upper lobe lung nodule. Continue femara.    #Mediport planned given poor IV access; we will plan infusion platelets  # weekly cbc/hold tube/platelets; follow up in 3 weeks/MD. also discussed with Dr.Foster.      [name], MD 04/09/2017 6:12 PM

## 2017-03-27 NOTE — Progress Notes (Signed)
Notified Dr. Rogue Bussing at [phone] that once patient was disconnected from platelet transfusion she began to itch on her arms and chest. Hives noted. No signs of SOB, chest discomfort or fever. Vital signs stable. Ordered to give Tylenol, Benadryl, Pepcid, and Solumedrol emergently. Will continue to monitor closely.

## 2017-03-27 NOTE — Assessment & Plan Note (Addendum)
#  Severe thrombocytopenia//anemia- highly suspicious for MDS.  Given the increasing platelet transfusion requirements/worsening anemia-recommend a repeat bone marrow biopsy with foundation 1 testing.  Also repeat [facility] panel-given the recent small clone noted.   # stop Promacta given the lack of significant improvement;   #Skin rash-secondary to platelet transfusion recommend washed platelets/also suspect alloimmunization recommend crossmatch platelets.  Also discussed with Dr. Foster.  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 On Femara.  CT scan shows improving right upper lobe lung nodule. Continue femara.    #Mediport planned given poor IV access; we will plan infusion platelets  # weekly cbc/hold tube/platelets; follow up in 3 weeks/MD. also discussed with Dr.Foster.  

## 2017-03-28 ENCOUNTER — Other Ambulatory Visit: Payer: Self-pay | Admitting: *Deleted

## 2017-03-28 ENCOUNTER — Telehealth: Payer: Self-pay | Admitting: *Deleted

## 2017-03-28 ENCOUNTER — Encounter: Payer: Self-pay | Admitting: Internal Medicine

## 2017-03-28 DIAGNOSIS — C50811 Malignant neoplasm of overlapping sites of right female breast: Secondary | ICD-10-CM

## 2017-03-28 DIAGNOSIS — Z170 Estrogen receptor positive status [ER+]: Secondary | ICD-10-CM

## 2017-03-28 DIAGNOSIS — D696 Thrombocytopenia, unspecified: Secondary | ICD-10-CM

## 2017-03-28 DIAGNOSIS — D61818 Other pancytopenia: Secondary | ICD-10-CM

## 2017-03-28 DIAGNOSIS — Z87898 Personal history of other specified conditions: Secondary | ICD-10-CM

## 2017-03-28 LAB — TYPE AND SCREEN
ABO/RH(D): O POS
Antibody Screen: POSITIVE
DONOR AG TYPE: NEGATIVE
UNIT DIVISION: 0

## 2017-03-28 LAB — BPAM PLATELET PHERESIS
Blood Product Expiration Date: 201901182359
ISSUE DATE / TIME: 201901161548
UNIT TYPE AND RH: 5100

## 2017-03-28 LAB — PREPARE PLATELET PHERESIS: Unit division: 0

## 2017-03-28 LAB — BPAM RBC
Blood Product Expiration Date: 201902092359
ISSUE DATE / TIME: 201901161343
UNIT TYPE AND RH: 5100

## 2017-03-28 MED ORDER — PREDNISONE 20 MG PO TABS: 40.0000 mg | ORAL_TABLET | Freq: Every day | ORAL | 0 refills | Status: AC

## 2017-03-28 NOTE — Telephone Encounter (Signed)
Contacted patient to f/u on her mychart email to Dr. Jacinto Reap. Pt still has a itching rash status post plt infusion reaction.  I discussed pt's case with Dr. Rogue Bussing. Patient took 60 mg of prednisone today as directed but only had 40 mg dosing level for tomorrow. Pt instructed to take prednisone 2 tablets (40 mg total) by mouth daily with breakfast for 4 days. Then take 20 mg for 3 days then stop. Pt informed that new steriod rx was sent to her pharmacy - [facility] [place]. Dr. Rogue Bussing will speak to blood bank dept to provide further recommendations on preventing blood transfusion reactions. MD will have you see Lauren, our NP next week before her next blood transfusion to follow-up on her reaction to the blood transfusion. He also spoke to Dr. Royce Macadamia, who recommends that the pt will have another bone marrow biopsy. Dr. B would like to plan for this biopsy next Friday 1/25. I will coordinate this and let pt know when this appointment time will be. pt instructed to call our office back if she should have any further questions.   Bone marrow biopsy orders entered in epic and worksheet faxed to spec. Scheduling team to arrange. Date/time of bone marrow bx - Pending approval from radiology team

## 2017-03-28 NOTE — Telephone Encounter (Addendum)
Bone marrow biopsy on 04/05/17. Arrival time at 830 am. NPO after midnight. Pt must bring a driver. I contacted pt with the information above. Patient gave verbal understanding of the plan of care.

## 2017-03-29 ENCOUNTER — Inpatient Hospital Stay: Payer: Medicare Other

## 2017-03-29 ENCOUNTER — Emergency Department
Admission: EM | Admit: 2017-03-29 | Discharge: 2017-03-29 | Disposition: A | Discharge status: Home / Self Care | Payer: Medicare Other | Attending: Emergency Medicine | Admitting: Emergency Medicine

## 2017-03-29 ENCOUNTER — Telehealth: Payer: Self-pay | Admitting: Internal Medicine

## 2017-03-29 ENCOUNTER — Telehealth: Payer: Self-pay | Admitting: *Deleted

## 2017-03-29 DIAGNOSIS — Z96641 Presence of right artificial hip joint: Secondary | ICD-10-CM | POA: Insufficient documentation

## 2017-03-29 DIAGNOSIS — D696 Thrombocytopenia, unspecified: Secondary | ICD-10-CM

## 2017-03-29 DIAGNOSIS — Z79891 Long term (current) use of opiate analgesic: Secondary | ICD-10-CM | POA: Insufficient documentation

## 2017-03-29 DIAGNOSIS — R040 Epistaxis: Secondary | ICD-10-CM

## 2017-03-29 DIAGNOSIS — Z170 Estrogen receptor positive status [ER+]: Secondary | ICD-10-CM | POA: Diagnosis not present

## 2017-03-29 DIAGNOSIS — F17210 Nicotine dependence, cigarettes, uncomplicated: Secondary | ICD-10-CM | POA: Insufficient documentation

## 2017-03-29 DIAGNOSIS — D469 Myelodysplastic syndrome, unspecified: Secondary | ICD-10-CM | POA: Diagnosis not present

## 2017-03-29 DIAGNOSIS — I10 Essential (primary) hypertension: Secondary | ICD-10-CM | POA: Diagnosis not present

## 2017-03-29 DIAGNOSIS — R21 Rash and other nonspecific skin eruption: Secondary | ICD-10-CM | POA: Diagnosis not present

## 2017-03-29 DIAGNOSIS — Z85828 Personal history of other malignant neoplasm of skin: Secondary | ICD-10-CM | POA: Diagnosis not present

## 2017-03-29 DIAGNOSIS — Z79818 Long term (current) use of other agents affecting estrogen receptors and estrogen levels: Secondary | ICD-10-CM | POA: Diagnosis not present

## 2017-03-29 DIAGNOSIS — Z853 Personal history of malignant neoplasm of breast: Secondary | ICD-10-CM | POA: Diagnosis not present

## 2017-03-29 DIAGNOSIS — C50811 Malignant neoplasm of overlapping sites of right female breast: Secondary | ICD-10-CM | POA: Diagnosis not present

## 2017-03-29 DIAGNOSIS — C7801 Secondary malignant neoplasm of right lung: Secondary | ICD-10-CM | POA: Diagnosis not present

## 2017-03-29 LAB — CBC WITH DIFFERENTIAL/PLATELET
BASOS ABS: 0 10*3/uL (ref 0–0.1)
Basophils Relative: 0 %
Eosinophils Absolute: 0 10*3/uL (ref 0–0.7)
Eosinophils Relative: 0 %
HCT: 26.4 % — ABNORMAL LOW (ref 35.0–47.0)
HEMOGLOBIN: 9.1 g/dL — AB (ref 12.0–16.0)
LYMPHS ABS: 0.5 10*3/uL — AB (ref 1.0–3.6)
Lymphocytes Relative: 15 %
MCH: 32.9 pg (ref 26.0–34.0)
MCHC: 34.6 g/dL (ref 32.0–36.0)
MCV: 95.1 fL (ref 80.0–100.0)
Monocytes Absolute: 0.1 10*3/uL — ABNORMAL LOW (ref 0.2–0.9)
Monocytes Relative: 2 %
NEUTROS PCT: 83 %
Neutro Abs: 2.7 10*3/uL (ref 1.4–6.5)
PLATELETS: 8 10*3/uL — AB (ref 150–440)
RBC: 2.78 MIL/uL — AB (ref 3.80–5.20)
RDW: 21.5 % — ABNORMAL HIGH (ref 11.5–14.5)
WBC: 3.2 10*3/uL — AB (ref 3.6–11.0)

## 2017-03-29 LAB — PROTIME-INR
INR: 0.99
Prothrombin Time: 13 seconds (ref 11.4–15.2)

## 2017-03-29 LAB — APTT: APTT: 26 s (ref 24–36)

## 2017-03-29 NOTE — Telephone Encounter (Signed)
Spoke to pt of the plan for possible "spl" platelets. Re: streoids depends on any subsequent transfusion reaction tonite.

## 2017-03-29 NOTE — Telephone Encounter (Signed)
Patient called and reports that she has a nose bleed for the past hour and that she has blood filled blisters in her mouth as well. Discussed with Dr B, patient to come in now for stat cbc, PT, PTT. She agrees to come in now. She will wait for results

## 2017-03-29 NOTE — ED Triage Notes (Signed)
Pt came in from PCP with PLT level of 7.  Pt states she is having to have platelets about once a week.  Pt states she has had reactions with each time she gets platelets.  Pt states that each bag she gets, the reactions get worse.  Pt states she is also on 40mg  of prednisone for 4 days, then 30mg  for 3 days.  Pt states she has to be pretreated for platelets.  Pt states she is also had a nose bleed earlier today.  Pt states she has metastatic cancer.  She has had bone marrow biopsies to see if this is the cause of the platelets being depleted.  Pt is seeing several specialists for this.  Pt is A&Ox4, in NAD.

## 2017-03-29 NOTE — Discharge Instructions (Signed)
If you have any bleeding please return to the emergency room.  You would prefer not to stay in the hospital which is certainly her choice but does limit our ability to watch you.  Avoid trauma or climbing ladders etc. be very careful in the tub.  If there is any bleeding return right away.  Otherwise, follow closely with primary care doctor as well as oncologist. as an outpatient and tomorrow, we do expect that you should have platelets that can hopefully be transfused to you in the emergency department

## 2017-03-29 NOTE — ED Provider Notes (Signed)
[facility] Emergency Department Provider Note  ____________________________________________   I have reviewed the triage vital signs and the nursing notes. Where available I have reviewed prior notes and, if possible and indicated, outside hospital notes.    HISTORY  Chief Complaint Abnormal Lab    HPI Shelly Arias is a 72 y.o. female is here today because she was sent in by oncology.  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  She was sent in by oncology to receive a transfusion of platelets preferably in the ER as an outpatient apparently.  Patient had allergic reactions consisting of hives with prior transfusions and has had to stay on steroids prior to getting transfusions.  In any event, oncology talked to the blood bank and they are trying to get washed platelets for the patient which have not yet been ordered.  When they do arrive the plan is to pretreat the patient and give her platelets.     Past Medical History:  Diagnosis Date  . Breast cancer ([place])    right, lumpectomy, radiation, chemo  . Breast cancer ([place])    Right, 2007  . 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  . Headache   . History of chemotherapy   . History of radiation therapy   . Hyperlipidemia   . Hypertension   . Osteoarthritis    right hip  . Osteoporosis   . Squamous cell carcinoma    leg, Followed by Dr. Nicole Kindred    Patient Active Problem List   Diagnosis Date Noted  . Recurrent productive cough 02/25/2017  . History of blood transfusion  reaction 02/08/2017  . Constipation 12/27/2016  . B12 deficiency 10/09/2016  . 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 Acute blood loss anemia 08/04/2016  . GI bleed 08/03/2016  . Thrombocytopenia ([place]) 08/03/2016  . 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 Persistent headaches 07/02/2016  . Mass of upper inner quadrant of left breast 06/20/2016  . Carcinoma of overlapping sites of right breast in female, estrogen receptor positive ([place]) 06/08/2016  . Fatigue 06/08/2016  . Chronic fatigue 06/08/2016  . Nutritional anemia, unspecified 06/08/2016  . Prediabetes 01/25/2016  . Tobacco abuse 11/06/2012  . History of breast cancer 11/06/2012  . Osteopenia 11/08/2011  . Hyperlipidemia 11/02/2010  . Essential hypertension 12/23/2006  . GERD 12/23/2006    Past Surgical History:  Procedure Laterality Date  . ABDOMINAL HYSTERECTOMY    . BREAST BIOPSY Left 2002   left breast, calcifications  . BREAST BIOPSY Left 06/20/2016   INVASIVE DUCTAL CARCINOMA.  Marland Kitchen BREAST EXCISIONAL BIOPSY Right 2007   positive  . bunion repair    . ESOPHAGOGASTRODUODENOSCOPY (EGD) WITH PROPOFOL N/A 08/05/2016   Procedure: ESOPHAGOGASTRODUODENOSCOPY (EGD) WITH PROPOFOL;  Surgeon: San Jetty, MD;  Location: [facility] ENDOSCOPY;  Service: General;  Laterality: N/A;  . left breast biopsy    . NASAL SINUS SURGERY    . right hip replacement    . SHOULDER SURGERY    . SQUAMOUS CELL CARCINOMA EXCISION     right leg, Dr. Nicole Kindred  .  TOTAL HIP ARTHROPLASTY     right    Prior to Admission medications   Medication Sig Start Date End Date Taking? Authorizing Provider  ALPRAZolam (XANAX) 0.5 MG tablet TAKE 1 TABLET BY MOUTH EVERY 8 HOURS AS NEEDED FOR ANXIETY OR SLEEP Patient taking differently: take 1 tablet by mouth at bedtime 02/28/17  Yes Verlon Au, NP  eltrombopag (PROMACTA) 75 MG tablet Take 2 tablets (150 mg total) by mouth daily. Take on an empty stomach 1 hour before meals or 2 hours after. 02/19/17  Yes  [name], MD  ergocalciferol (VITAMIN D2) 50000 units capsule Take 1 capsule (50,000 Units total) by mouth once a week. 12/17/16  Yes [name], MD  Fexofenadine HCl (ALLEGRA PO) Take 1 capsule by mouth.   Yes [provider]  glucosamine-chondroitin 500-400 MG tablet Take 1 tablet by mouth daily.    Yes [provider]  letrozole (FEMARA) 2.5 MG tablet Take 1 tablet (2.5 mg total) by mouth daily. Once a day. 07/02/16  Yes [name], MD  metoprolol succinate (TOPROL-XL) 25 MG 24 hr tablet Take 1 tablet (25 mg total) by mouth daily. 05/11/16  Yes Cook, Jayce G, DO  pravastatin (PRAVACHOL) 40 MG tablet Take 1 tablet (40 mg total) by mouth daily. 05/11/16  Yes Cook, Jayce G, DO  predniSONE (DELTASONE) 20 MG tablet Take 2 tablets (40 mg total) by mouth daily with breakfast for 4 days. Then take 20 mg for 3 days 03/28/17 04/01/17 Yes [name], MD  Probiotic Product (PROBIOTIC & ACIDOPHILUS EX ST PO) Take 1 tablet by mouth daily.    Yes [provider]  vitamin B-12 (CYANOCOBALAMIN) 1000 MCG tablet Take 1,000 mcg by mouth daily.   Yes [provider]    Allergies Codeine; Fish-derived products; Morphine sulfate; Fish allergy; Morphine sulfate; and Oxycodone  Family History  Problem Relation Age of Onset  . Heart disease Father 79  . Heart disease Mother   . Hyperlipidemia Mother   . Heart disease Brother   . Diabetes Brother   . Diabetes Maternal Grandmother   . Breast cancer Cousin   . Kidney disease Brother     Social History Social History   Tobacco Use  . Smoking status: Current Some Day Smoker    Packs/day: 0.10    Years: 30.00    Pack years: 3.00    Types: Cigarettes  . Smokeless tobacco: Never Used  Substance Use Topics  . Alcohol use: Yes    Alcohol/week: 0.0 oz    Comment: socially  . Drug use: No    Review of Systems Constitutional: No fever/chills Eyes: No visual changes. ENT: No sore  throat. No stiff neck no neck pain Cardiovascular: Denies chest pain. Respiratory: Denies shortness of breath. Gastrointestinal:   no vomiting.  No diarrhea.  No constipation. Genitourinary: Negative for dysuria. Musculoskeletal: Negative lower extremity swelling Skin: Negative for rash. Neurological: Negative for severe headaches, focal weakness or numbness.   ____________________________________________   PHYSICAL EXAM:  VITAL SIGNS: ED Triage Vitals [03/29/17 1652]  Enc Vitals Group     BP (!) 189/79     Pulse Rate 97     Resp 18     Temp 98.7 F (37.1 C)     Temp src      SpO2 97 %     Weight 139 lb (63 kg)     Height      Head Circumference  Peak Flow      Pain Score      Pain Loc      Pain Edu?      Excl. in [place]?     Constitutional: Alert and oriented. Well appearing and in no acute distress. Eyes: Conjunctivae are normal Head: Atraumatic HEENT: No congestion/rhinnorhea. Mucous membranes are moist.  Oropharynx non-erythematous there is a small bruise noted in the oropharynx which is not bleeding, on the buccal surface Neck:   Nontender with no meningismus, no masses, no stridor Cardiovascular: Normal rate, regular rhythm. Grossly normal heart sounds.  Good peripheral circulation. Respiratory: Normal respiratory effort.  No retractions. Lungs CTAB. Abdominal: Soft and nontender. No distention. No guarding no rebound Back:  There is no focal tenderness or step off.  there is no midline tenderness there are no lesions noted. there is no CVA tenderness Musculoskeletal: No lower extremity tenderness, no upper extremity tenderness. No joint effusions, no DVT signs strong distal pulses no edema Neurologic:  Normal speech and language. No gross focal neurologic deficits are appreciated.  Skin:  Skin is warm, dry and intact. No rash noted. Psychiatric: Mood and affect are normal. Speech and behavior are normal.  ____________________________________________    LABS (all labs ordered are listed, but only abnormal results are displayed)  Labs Reviewed - No data to display  Pertinent labs  results that were available during my care of the patient were reviewed by me and considered in my medical decision making (see chart for details). ____________________________________________  EKG  I personally interpreted any EKGs ordered by me or triage  ____________________________________________  RADIOLOGY  Pertinent labs & imaging results that were available during my care of the patient were reviewed by me and considered in my medical decision making (see chart for details). If possible, patient and/or family made aware of any abnormal findings.  No results found. ____________________________________________    PROCEDURES  Procedure(s) performed: None  Procedures  Critical Care performed: None  ____________________________________________   INITIAL IMPRESSION / ASSESSMENT AND PLAN / ED COURSE  Pertinent labs & imaging results that were available during my care of the patient were reviewed by me and considered in my medical decision making (see chart for details).  Patient is in no acute distress no evidence of active bleeding, I can review all the blood work from this morning, she has not been bleeding since this morning, unfortunately, the platelets are not here they have [place] from [place] of [place].  The patient does not want to stay in the hospital.  I also discussed extensively with Dr. Rogue Bussing, he and I talked about the utility of repeat transfusion in a patient who has allergic reactions to transfusion given that her platelet count almost immediately went back down to a very low number.  He understands our concerns, but feels very strong the patient should have a transfusion.  I discussed this with the patient, she is okay having a transfusion but she is adamant that she does not want to wait in the hospital for  platelets to be shipped.  She understands the risk of going home including bleeding.  I have therefore discussed with the blood bank, and they will order the platelets, in addition, as the patient refuses to spend the night in the hospital and the plan is for her to come back to the emergency room tomorrow to receive a platelet transfusion.  This is the only thing that can be arranged according to oncology.  Patient  is very comfortable with this plan and will come back if she has any bleeding or other concerns.  Given that she refuses to stay, I do not have platelets to give her, we will discharge her.  The blood bank is to call the ER when her platelets are ready    ____________________________________________   FINAL CLINICAL IMPRESSION(S) / ED DIAGNOSES  Final diagnoses:  Thrombocytopenia ([place])      This chart was dictated using voice recognition software.  Despite best efforts to proofread,  errors can occur which can change meaning.      Schuyler Amor, MD 03/29/17 2049

## 2017-03-29 NOTE — Telephone Encounter (Signed)
Call report to [facility] in ER. Patient has symptomatic thrombocytopenia. H/o breast cancer.  Pt is alert/oriented. C/o active Bleeding from nose and gums. Critical plt count of 7. Per Dr. Rogue Bussing pt was advised to go to ER-pt will require plt infusion. Hand off provided to [place]. Nurse in Ed notified that patient has previously reacted to IV plt infusion on multiple occassions. Patient usually received 650 mg of Tylenol, 50 mg of benadryl and 60 solumedrol dosing IV to prevent reactions. Even with this regimen, pt has reacted to IV plts. I did discuss that patient is currently on 40 mg of prednisone daily. Colletta Maryland will give this information to the nurse taking care of the patient.

## 2017-03-29 NOTE — Telephone Encounter (Signed)
Given nosebleed CBC check platelets 7; platelet transfusion yesterday;   patient has allergic reaction to platelets-in spite of premedications with steroids/Tylenol/Benadryl  Spoke to Dr. Thane [facility] [phone]; who kindly agrees to check into the possibility of substituting the plasma for alternative additive-which might cut down  the risk of platelet transfusion allergic reaction.   Spoke to Dr.Malinda in ER; re: above.

## 2017-03-30 ENCOUNTER — Telehealth: Payer: Self-pay | Admitting: Internal Medicine

## 2017-03-30 ENCOUNTER — Emergency Department
Admission: EM | Admit: 2017-03-30 | Discharge: 2017-03-30 | Disposition: A | Discharge status: Home / Self Care | Payer: Medicare Other | Attending: Emergency Medicine | Admitting: Emergency Medicine

## 2017-03-30 DIAGNOSIS — Z85828 Personal history of other malignant neoplasm of skin: Secondary | ICD-10-CM | POA: Diagnosis not present

## 2017-03-30 DIAGNOSIS — I10 Essential (primary) hypertension: Secondary | ICD-10-CM | POA: Diagnosis not present

## 2017-03-30 DIAGNOSIS — F17210 Nicotine dependence, cigarettes, uncomplicated: Secondary | ICD-10-CM | POA: Diagnosis not present

## 2017-03-30 DIAGNOSIS — D696 Thrombocytopenia, unspecified: Secondary | ICD-10-CM | POA: Diagnosis not present

## 2017-03-30 DIAGNOSIS — Z79899 Other long term (current) drug therapy: Secondary | ICD-10-CM | POA: Insufficient documentation

## 2017-03-30 DIAGNOSIS — Z853 Personal history of malignant neoplasm of breast: Secondary | ICD-10-CM | POA: Diagnosis not present

## 2017-03-30 DIAGNOSIS — E8771 Transfusion associated circulatory overload: Secondary | ICD-10-CM | POA: Diagnosis not present

## 2017-03-30 DIAGNOSIS — Z5189 Encounter for other specified aftercare: Secondary | ICD-10-CM

## 2017-03-30 DIAGNOSIS — Z96641 Presence of right artificial hip joint: Secondary | ICD-10-CM | POA: Insufficient documentation

## 2017-03-30 LAB — CBC WITH DIFFERENTIAL/PLATELET
BASOS ABS: 0 10*3/uL (ref 0–0.1)
Basophils Relative: 0 %
EOS PCT: 0 %
Eosinophils Absolute: 0 10*3/uL (ref 0–0.7)
HCT: 25.6 % — ABNORMAL LOW (ref 35.0–47.0)
Hemoglobin: 8.8 g/dL — ABNORMAL LOW (ref 12.0–16.0)
LYMPHS PCT: 48 %
Lymphs Abs: 2.2 10*3/uL (ref 1.0–3.6)
MCH: 32.8 pg (ref 26.0–34.0)
MCHC: 34.4 g/dL (ref 32.0–36.0)
MCV: 95.4 fL (ref 80.0–100.0)
MONO ABS: 0.4 10*3/uL (ref 0.2–0.9)
Monocytes Relative: 8 %
Neutro Abs: 2 10*3/uL (ref 1.4–6.5)
Neutrophils Relative %: 44 %
PLATELETS: 6 10*3/uL — AB (ref 150–440)
RBC: 2.69 MIL/uL — ABNORMAL LOW (ref 3.80–5.20)
RDW: 22.1 % — AB (ref 11.5–14.5)
WBC: 4.6 10*3/uL (ref 3.6–11.0)

## 2017-03-30 MED ORDER — ACETAMINOPHEN 325 MG PO TABS
650.0000 mg | ORAL_TABLET | Freq: Once | ORAL | Status: AC
  Administered 2017-03-30: 650 mg via ORAL
  Filled 2017-03-30: qty 2

## 2017-03-30 MED ORDER — DIPHENHYDRAMINE HCL 50 MG/ML IJ SOLN
25.0000 mg | INTRAMUSCULAR | Status: AC
  Administered 2017-03-30: 25 mg via INTRAVENOUS
  Filled 2017-03-30: qty 1

## 2017-03-30 MED ORDER — HYDROCORTISONE NA SUCCINATE PF 250 MG IJ SOLR
200.0000 mg | Freq: Once | INTRAMUSCULAR | Status: AC
  Administered 2017-03-30: 200 mg via INTRAVENOUS
  Filled 2017-03-30 (×2): qty 200

## 2017-03-30 MED ORDER — HYDROCORTISONE NA SUCCINATE PF 250 MG IJ SOLR
200.0000 mg | Freq: Once | INTRAMUSCULAR | Status: DC
  Filled 2017-03-30: qty 200

## 2017-03-30 NOTE — ED Notes (Signed)
Pt having no signs of reaction at this time. Pt is alert and talking to husband eating breakfast. Will continue to monitor.

## 2017-03-30 NOTE — ED Notes (Signed)
Critical result: notified Dr. Reita Cliche of platelet of 7.

## 2017-03-30 NOTE — Discharge Instructions (Addendum)
Return to the emergency room immediately for any rectal bleeding, black or bloody stools, abdominal pain or chest pain, coughing up blood, symptoms of allergic reaction such as rash, fever, nausea, vomiting, dizziness or passing out, or any other symptoms concerning to you.

## 2017-03-30 NOTE — ED Triage Notes (Signed)
Patient reports that she was asked to come to ED by PCP due to platelet count of 7.  Patient reports that she has allergic reactions to platelet transfusions and has to be premedicated.   Patient reports hx of cancer.

## 2017-03-30 NOTE — ED Provider Notes (Signed)
[facility] Emergency Department Provider Note  ____________________________________________   First MD Initiated Contact with Patient 03/30/17 [phone]     (approximate)  I have reviewed the triage vital signs and the nursing notes.   HISTORY  Chief Complaint Low Platelets    HPI Shelly Arias is a 72 y.o. female followed by Dr. Rogue Bussing in the heme on clinic who presents today for thrombocytopenia and platelet transfusion.  Please refer to the prior provider note by Dr. Burlene Arnt for additional details, but in short, the patient was sent to the emergency department last night by Dr. Rogue Bussing for a platelet transfusion.  [facility] does not have platelets available and the patient has had apparent transfusion reaction of itching in the past, so after several discussions between Dr. Burlene Arnt and Dr. Rogue Bussing, it was decided that Dr. Burlene Arnt would order the platelets for transfusion this morning and washed platelets would be prepared at [facility] and sent to [facility] around 8:00 AM.  The patient presents this morning for pretreatment followed by platelet transfusion.  She is in her usual state of health.  She denies fever/chills, chest pain, shortness of breath, nausea, vomiting, and abdominal pain.  She has had no bleeding.  She does not feel weak nor dizzy.  She has been treated as an outpatient with steroids but she did not take her dose of prednisone this morning, knowing that she was going to get additional steroids for pretreatment.  Past Medical History:  Diagnosis Date  . Breast cancer ([place])    right, lumpectomy, radiation, chemo  . Breast cancer ([place])    Right, 2007  . 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  . Headache   . History of chemotherapy   . History of radiation therapy   . Hyperlipidemia   . Hypertension   . Osteoarthritis    right hip  . Osteoporosis   . Squamous  cell carcinoma    leg, Followed by Dr. Nicole Kindred    Patient Active Problem List   Diagnosis Date Noted  . Recurrent productive cough 02/25/2017  . History of blood transfusion reaction 02/08/2017  . Constipation 12/27/2016  . B12 deficiency 10/09/2016  . MDS (myelodysplastic syndrome) ([place]) 09/24/2016  . Acute blood loss anemia 08/04/2016  . GI bleed 08/03/2016  . Thrombocytopenia ([place]) 08/03/2016  . 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 Persistent headaches 07/02/2016  . Mass of upper inner quadrant of left breast 06/20/2016  . Carcinoma of overlapping sites of right breast in female, estrogen receptor positive ([place]) 06/08/2016  . Fatigue 06/08/2016  . Chronic fatigue 06/08/2016  . Nutritional anemia, unspecified 06/08/2016  . Prediabetes 01/25/2016  . Tobacco abuse 11/06/2012  . History of breast cancer 11/06/2012  . Osteopenia 11/08/2011  . Hyperlipidemia 11/02/2010  . Essential hypertension 12/23/2006  . GERD 12/23/2006    Past Surgical History:  Procedure Laterality Date  . ABDOMINAL HYSTERECTOMY    . BREAST BIOPSY Left 2002   left breast, calcifications  . BREAST BIOPSY Left 06/20/2016   INVASIVE DUCTAL CARCINOMA.  Marland Kitchen BREAST EXCISIONAL BIOPSY Right 2007   positive  . bunion repair    . ESOPHAGOGASTRODUODENOSCOPY (EGD) WITH PROPOFOL N/A 08/05/2016   Procedure: ESOPHAGOGASTRODUODENOSCOPY (EGD) WITH PROPOFOL;  Surgeon: San Jetty, MD;  Location: [facility] ENDOSCOPY;  Service: General;  Laterality: N/A;  . left breast biopsy    . NASAL SINUS SURGERY    . right hip replacement    . SHOULDER  SURGERY    . SQUAMOUS CELL CARCINOMA EXCISION     right leg, Dr. Nicole Kindred  . TOTAL HIP ARTHROPLASTY     right    Prior to Admission medications   Medication Sig Start Date End Date Taking? Authorizing Provider  ALPRAZolam (XANAX) 0.5 MG tablet TAKE 1 TABLET BY MOUTH EVERY 8 HOURS AS NEEDED FOR ANXIETY OR SLEEP Patient taking differently: take 1 tablet by mouth at bedtime 02/28/17   Verlon Au, NP  eltrombopag (PROMACTA) 75 MG tablet Take 2 tablets (150 mg total) by mouth daily. Take on an empty stomach 1 hour before meals or 2 hours after. 02/19/17   [name], MD  ergocalciferol (VITAMIN D2) 50000 units capsule Take 1 capsule (50,000 Units total) by mouth once a week. 12/17/16   [name], MD  Fexofenadine HCl (ALLEGRA PO) Take 1 capsule by mouth.    [provider]  glucosamine-chondroitin 500-400 MG tablet Take 1 tablet by mouth daily.     [provider]  letrozole (FEMARA) 2.5 MG tablet Take 1 tablet (2.5 mg total) by mouth daily. Once a day. 07/02/16   [name], MD  metoprolol succinate (TOPROL-XL) 25 MG 24 hr tablet Take 1 tablet (25 mg total) by mouth daily. 05/11/16   Coral Spikes, DO  pravastatin (PRAVACHOL) 40 MG tablet Take 1 tablet (40 mg total) by mouth daily. 05/11/16   Coral Spikes, DO  predniSONE (DELTASONE) 20 MG tablet Take 2 tablets (40 mg total) by mouth daily with breakfast for 4 days. Then take 20 mg for 3 days 03/28/17 04/01/17  [name], MD  Probiotic Product (PROBIOTIC & ACIDOPHILUS EX ST PO) Take 1 tablet by mouth daily.     [provider]  vitamin B-12 (CYANOCOBALAMIN) 1000 MCG tablet Take 1,000 mcg by mouth daily.    [provider]    Allergies Codeine; Fish-derived products; Morphine sulfate; Fish allergy; Morphine sulfate; and Oxycodone  Family History  Problem Relation Age of Onset  . Heart disease Father 92  . Heart disease Mother   . Hyperlipidemia Mother   . Heart disease Brother   . Diabetes Brother   . Diabetes Maternal Grandmother   . Breast cancer Cousin   . Kidney disease Brother     Social History Social History   Tobacco Use  . Smoking status: Current Some Day Smoker    Packs/day: 0.10    Years: 30.00    Pack years: 3.00    Types: Cigarettes  . Smokeless tobacco: Never Used  Substance Use Topics  . Alcohol use: Yes    Alcohol/week:  0.0 oz    Comment: socially  . Drug use: No    Review of Systems Constitutional: No fever/chills Eyes: No visual changes. ENT: No sore throat. Cardiovascular: Denies chest pain. Respiratory: Denies shortness of breath. Gastrointestinal: No abdominal pain.  No nausea, no vomiting.  No diarrhea.  No constipation. Genitourinary: Negative for dysuria. Musculoskeletal: Negative for neck pain.  Negative for back pain. Integumentary: Negative for rash.  No bleeding. Neurological: Negative for headaches, focal weakness or numbness.   ____________________________________________   PHYSICAL EXAM:  VITAL SIGNS: ED Triage Vitals  Enc Vitals Group     BP 03/30/17 0601 (!) 143/72     Pulse Rate 03/30/17 0601 93     Resp 03/30/17 0601 18     Temp 03/30/17 0601 98.1 F (36.7 C)     Temp Source 03/30/17 0601 Oral  SpO2 03/30/17 0601 97 %     Weight 03/30/17 0600 63 kg (139 lb)     Height 03/30/17 0600 1.549 m (5\' 1" )     Head Circumference --      Peak Flow --      Pain Score 03/30/17 0553 0     Pain Loc --      Pain Edu? --      Excl. in [place]? --     Constitutional: Alert and oriented. Well appearing and in no acute distress. Eyes: Conjunctivae are normal.  Head: Atraumatic. Neck: No stridor.  No meningeal signs.   Cardiovascular: Normal rate, regular rhythm. Good peripheral circulation. Grossly normal heart sounds. Respiratory: Normal respiratory effort.  No retractions. Lungs CTAB. Gastrointestinal: Soft and nontender. No distention.  Musculoskeletal: No lower extremity tenderness nor edema. No gross deformities of extremities. Neurologic:  Normal speech and language. No gross focal neurologic deficits are appreciated.  Skin:  Skin is warm, dry and intact. No rash or ecchymoses noted Psychiatric: Mood and affect are normal. Speech and behavior are normal.  ____________________________________________   LABS (all labs ordered are listed, but only abnormal results are  displayed)  Labs Reviewed  CBC WITH DIFFERENTIAL/PLATELET - Abnormal; Notable for the following components:      Result Value   RBC 2.69 (*)    Hemoglobin 8.8 (*)    HCT 25.6 (*)    RDW 22.1 (*)    All other components within normal limits   ____________________________________________  EKG  None - EKG not ordered by ED physician ____________________________________________  RADIOLOGY   No results found.  ____________________________________________   PROCEDURES  Critical Care performed: No   Procedure(s) performed:   Procedures   ____________________________________________   INITIAL IMPRESSION / ASSESSMENT AND PLAN / ED COURSE  As part of my medical decision making, I reviewed the following data within the [place] notes reviewed and incorporated, Labs reviewed , Old chart reviewed and Notes from prior ED visits    Differential diagnosis includes, but is not limited to, idiopathic thrombocytopenia, HIT, ITP, TTP, etc.  However the patient is well-known to Dr. Rogue Bussing and he is not concerned about acute issues other than the acute that was cytopenia with platelets less than 10,000.  Although she has no acute bleeding at this time and understands the risks of nonemergent platelet transfusion in a patient without bleeding, he that the patient should be transfused.  As documented in the clinical course below, I verified with him his preferred pretreatment regimen.  The patient is in no acute distress and awaiting pretreatment and platelet transfusion. Her ED nurse, Dorothyann Gibbs, verified  with the blood bank that the platelets have been ordered and that the patient does not need another type and screen.  I also called the blood bank and verified that one unit of washed platelets is being prepared.  Clinical Course as of Mar 30 745  Sat Mar 30, 2017  4132 Discussed by phone with Dr. Rogue Bussing to verify the pretreatment regimen he prefers.  He  recommended hydrocortisone 100 mg IV, Benadryl 25 mg IV, and Tylenol 650 mg PO.  Starting pre-treatment now.   [CF]  0701 Completed risks/benefits/consent discussion, patient will sign electronically  [CF]  [phone] Transferring ED care to Dr. Reita Cliche to monitor platelet transfusion and reassess.  [CF]    Clinical Course User Index [CF] Hinda Kehr, MD    ____________________________________________  FINAL CLINICAL IMPRESSION(S) / ED DIAGNOSES  Final diagnoses:  Thrombocytopenia ([place])  Transfusion of platelets during current hospitalization     MEDICATIONS GIVEN DURING THIS VISIT:  Medications  diphenhydrAMINE (BENADRYL) injection 25 mg (not administered)  hydrocortisone sodium succinate (SOLU-CORTEF) injection 200 mg (not administered)  acetaminophen (TYLENOL) tablet 650 mg (650 mg Oral Given 03/30/17 3358)     ED Discharge Orders    None       Note:  This document was prepared using Dragon voice recognition software and may include unintentional dictation errors.    Hinda Kehr, MD 03/30/17 [phone]

## 2017-03-30 NOTE — Telephone Encounter (Signed)
Left VM for pt to check on her post transfusion; asked pt to call us back if any questions/concerns.    please check cbc/hold tube on 1/21. Please inform pt

## 2017-03-30 NOTE — ED Provider Notes (Deleted)
Clinical Course as of Mar 30 621  Sat Mar 30, 2017  3295 Discussed by phone with Dr. Rogue Bussing to verify the pretreatment regimen he prefers.  He recommended hydrocortisone 100 mg IV, Benadryl 25 mg IV, and Tylenol 650 mg PO.  Starting pre-treatment now.   [CF]    Clinical Course User Index [CF] Hinda Kehr, MD      Hinda Kehr, MD 03/30/17 [phone]

## 2017-03-30 NOTE — ED Notes (Signed)
Pt having no s/s of transfusion reaction.

## 2017-03-30 NOTE — ED Provider Notes (Signed)
[facility]  I accepted care from Dr. Karma Greaser ____________________________________________    LABS (pertinent positives/negatives)  I, Lisa Roca, MD have personally reviewed the lab reports noted below.  Labs Reviewed  CBC WITH DIFFERENTIAL/PLATELET - Abnormal; Notable for the following components:      Result Value   RBC 2.69 (*)    Hemoglobin 8.8 (*)    HCT 25.6 (*)    RDW 22.1 (*)    Platelets 6 (*)    All other components within normal limits       ____________________________________________   PROCEDURES  Procedure(s) performed: None  Critical Care performed: None  ____________________________________________   INITIAL IMPRESSION / ASSESSMENT AND PLAN / ED COURSE   Pertinent labs & imaging results that were available during my care of the patient were reviewed by me and considered in my medical decision making (see chart for details).  Patient was given routine platelet transfusion under emergency monitoring given history of allergic reaction with previous.  She was pretreated.  Patient received her platelet transfusion and watch for about an hour with no symptoms of allergic reaction.  I am comfortable that patient is able to go ahead and discharge home.  Her reaction previously was right at the end of the transfusion.  She feels comfortable as well.  She can follow-up with her oncologist, Dr. Rogue Bussing.    Patient / Family / Caregiver informed of clinical course, medical decision-making process, and agree with plan.   I discussed return precautions, follow-up instructions, and discharged instructions with patient and/or family.   Discharge instructions:  Return to the emergency room immediately for any rectal bleeding, black or bloody stools, abdominal pain or chest pain, coughing up blood, symptoms of allergic reaction such as rash, fever, nausea, vomiting, dizziness or passing out, or any other symptoms concerning to  you.   ____________________________________________   FINAL CLINICAL IMPRESSION(S) / ED DIAGNOSES  Final diagnoses:  Thrombocytopenia ([place])  Transfusion of platelets during current hospitalization        Lisa Roca, MD 03/30/17 1127

## 2017-03-31 LAB — PREPARE PLATELET PHERESIS: UNIT DIVISION: 0

## 2017-03-31 LAB — BPAM PLATELET PHERESIS
Blood Product Expiration Date: 201901191017
ISSUE DATE / TIME: 201901190834
Unit Type and Rh: 5100

## 2017-04-01 ENCOUNTER — Telehealth: Payer: Self-pay | Admitting: *Deleted

## 2017-04-01 ENCOUNTER — Other Ambulatory Visit: Payer: Self-pay | Admitting: *Deleted

## 2017-04-01 ENCOUNTER — Inpatient Hospital Stay: Payer: Medicare Other

## 2017-04-01 ENCOUNTER — Other Ambulatory Visit: Payer: Self-pay | Admitting: Internal Medicine

## 2017-04-01 DIAGNOSIS — R21 Rash and other nonspecific skin eruption: Secondary | ICD-10-CM | POA: Diagnosis not present

## 2017-04-01 DIAGNOSIS — D696 Thrombocytopenia, unspecified: Secondary | ICD-10-CM

## 2017-04-01 DIAGNOSIS — C50811 Malignant neoplasm of overlapping sites of right female breast: Secondary | ICD-10-CM

## 2017-04-01 DIAGNOSIS — Z170 Estrogen receptor positive status [ER+]: Principal | ICD-10-CM

## 2017-04-01 DIAGNOSIS — D469 Myelodysplastic syndrome, unspecified: Secondary | ICD-10-CM | POA: Diagnosis not present

## 2017-04-01 DIAGNOSIS — C7801 Secondary malignant neoplasm of right lung: Secondary | ICD-10-CM | POA: Diagnosis not present

## 2017-04-01 DIAGNOSIS — Z79818 Long term (current) use of other agents affecting estrogen receptors and estrogen levels: Secondary | ICD-10-CM | POA: Diagnosis not present

## 2017-04-01 LAB — CBC WITH DIFFERENTIAL/PLATELET
BASOS PCT: 0 %
Basophils Absolute: 0 10*3/uL (ref 0–0.1)
EOS ABS: 0 10*3/uL (ref 0–0.7)
EOS PCT: 0 %
HCT: 26.5 % — ABNORMAL LOW (ref 35.0–47.0)
HEMOGLOBIN: 9.3 g/dL — AB (ref 12.0–16.0)
LYMPHS ABS: 1.8 10*3/uL (ref 1.0–3.6)
Lymphocytes Relative: 39 %
MCH: 33.3 pg (ref 26.0–34.0)
MCHC: 34.9 g/dL (ref 32.0–36.0)
MCV: 95.5 fL (ref 80.0–100.0)
MONO ABS: 0.4 10*3/uL (ref 0.2–0.9)
MONOS PCT: 9 %
NEUTROS PCT: 52 %
Neutro Abs: 2.4 10*3/uL (ref 1.4–6.5)
PLATELETS: 6 10*3/uL — AB (ref 150–400)
RBC: 2.78 MIL/uL — ABNORMAL LOW (ref 3.80–5.20)
RDW: 20.9 % — AB (ref 11.5–14.5)
WBC: 4.7 10*3/uL (ref 3.6–11.0)

## 2017-04-01 LAB — SAMPLE TO BLOOD BANK

## 2017-04-01 NOTE — Telephone Encounter (Signed)
Patient presents to clinic today for lab only. Patient will require cross match plts and washed per blood bank. Will take approx. 24 hrs to get unit ready. Unit coming from [facility].

## 2017-04-01 NOTE — Telephone Encounter (Signed)
Lab orders entered per md order. Shelly Arias will call patient to obtain an add on apt today.

## 2017-04-02 ENCOUNTER — Other Ambulatory Visit: Payer: Self-pay | Admitting: Student

## 2017-04-02 ENCOUNTER — Inpatient Hospital Stay: Payer: Medicare Other

## 2017-04-02 DIAGNOSIS — Z170 Estrogen receptor positive status [ER+]: Secondary | ICD-10-CM | POA: Diagnosis not present

## 2017-04-02 DIAGNOSIS — D696 Thrombocytopenia, unspecified: Secondary | ICD-10-CM

## 2017-04-02 DIAGNOSIS — D469 Myelodysplastic syndrome, unspecified: Secondary | ICD-10-CM | POA: Diagnosis not present

## 2017-04-02 DIAGNOSIS — R21 Rash and other nonspecific skin eruption: Secondary | ICD-10-CM | POA: Diagnosis not present

## 2017-04-02 DIAGNOSIS — C7801 Secondary malignant neoplasm of right lung: Secondary | ICD-10-CM | POA: Diagnosis not present

## 2017-04-02 DIAGNOSIS — C50811 Malignant neoplasm of overlapping sites of right female breast: Secondary | ICD-10-CM | POA: Diagnosis not present

## 2017-04-02 DIAGNOSIS — Z79818 Long term (current) use of other agents affecting estrogen receptors and estrogen levels: Secondary | ICD-10-CM | POA: Diagnosis not present

## 2017-04-02 MED ORDER — SODIUM CHLORIDE 0.9 % IV SOLN
250.0000 mL | Freq: Once | INTRAVENOUS | Status: AC
  Administered 2017-04-02: 250 mL via INTRAVENOUS
  Filled 2017-04-02: qty 250

## 2017-04-02 MED ORDER — DIPHENHYDRAMINE HCL 25 MG PO CAPS
25.0000 mg | ORAL_CAPSULE | Freq: Once | ORAL | Status: AC
  Administered 2017-04-02: 25 mg via ORAL
  Filled 2017-04-02: qty 1

## 2017-04-02 MED ORDER — ACETAMINOPHEN 325 MG PO TABS
650.0000 mg | ORAL_TABLET | Freq: Once | ORAL | Status: AC
  Administered 2017-04-02: 650 mg via ORAL

## 2017-04-02 MED ORDER — ACETAMINOPHEN 325 MG PO TABS
ORAL_TABLET | ORAL | Status: AC
  Filled 2017-04-02: qty 2

## 2017-04-02 MED ORDER — METHYLPREDNISOLONE SODIUM SUCC 125 MG IJ SOLR
60.0000 mg | Freq: Once | INTRAMUSCULAR | Status: AC
  Administered 2017-04-02: 60 mg via INTRAVENOUS
  Filled 2017-04-02: qty 2

## 2017-04-03 ENCOUNTER — Telehealth: Payer: Self-pay | Admitting: *Deleted

## 2017-04-03 ENCOUNTER — Inpatient Hospital Stay: Payer: Medicare Other

## 2017-04-03 ENCOUNTER — Telehealth: Payer: Self-pay | Admitting: Internal Medicine

## 2017-04-03 ENCOUNTER — Other Ambulatory Visit: Payer: Self-pay | Admitting: Radiology

## 2017-04-03 DIAGNOSIS — Z79818 Long term (current) use of other agents affecting estrogen receptors and estrogen levels: Secondary | ICD-10-CM | POA: Diagnosis not present

## 2017-04-03 DIAGNOSIS — C50811 Malignant neoplasm of overlapping sites of right female breast: Secondary | ICD-10-CM | POA: Diagnosis not present

## 2017-04-03 DIAGNOSIS — Z170 Estrogen receptor positive status [ER+]: Secondary | ICD-10-CM | POA: Diagnosis not present

## 2017-04-03 DIAGNOSIS — R21 Rash and other nonspecific skin eruption: Secondary | ICD-10-CM | POA: Diagnosis not present

## 2017-04-03 DIAGNOSIS — C7801 Secondary malignant neoplasm of right lung: Secondary | ICD-10-CM | POA: Diagnosis not present

## 2017-04-03 DIAGNOSIS — D469 Myelodysplastic syndrome, unspecified: Secondary | ICD-10-CM

## 2017-04-03 LAB — CBC WITH DIFFERENTIAL/PLATELET
BASOS PCT: 0 %
Basophils Absolute: 0 10*3/uL (ref 0–0.1)
EOS ABS: 0 10*3/uL (ref 0–0.7)
Eosinophils Relative: 0 %
HEMATOCRIT: 23.9 % — AB (ref 35.0–47.0)
HEMOGLOBIN: 8.3 g/dL — AB (ref 12.0–16.0)
LYMPHS ABS: 2.8 10*3/uL (ref 1.0–3.6)
Lymphocytes Relative: 54 %
MCH: 32.8 pg (ref 26.0–34.0)
MCHC: 34.8 g/dL (ref 32.0–36.0)
MCV: 94.1 fL (ref 80.0–100.0)
MONO ABS: 0.5 10*3/uL (ref 0.2–0.9)
MONOS PCT: 9 %
NEUTROS ABS: 2 10*3/uL (ref 1.4–6.5)
Neutrophils Relative %: 37 %
Platelets: 80 10*3/uL — ABNORMAL LOW (ref 150–440)
RBC: 2.54 MIL/uL — ABNORMAL LOW (ref 3.80–5.20)
RDW: 20.9 % — AB (ref 11.5–14.5)
WBC: 5.3 10*3/uL (ref 3.6–11.0)

## 2017-04-03 LAB — SAMPLE TO BLOOD BANK

## 2017-04-03 NOTE — Telephone Encounter (Signed)
Discussed with Dr.Smir; will plan [facility] as per recommendations from [facility] Requisition for [facility] signed.

## 2017-04-03 NOTE — Telephone Encounter (Signed)
Left vm for patient- pt has an apt with Dr. Bary Castilla at 10:15 am. Left msg for patient to return my phone call.

## 2017-04-03 NOTE — Telephone Encounter (Addendum)
Spoke with patient in clinic today. plt count results from today were discussed in length. Approximately 45 mins spent face to face collaborating her care. Pt's plt count today is 80.  Pt received 1 unit of blood yesterday (crossmatched and washed). Pt requesting to have port placed by Dr. Bary Castilla asap since plts have improved to 48. I spoke with Dr. Rogue Bussing and he asked me to coordinate this with Dr. Dwyane Luo office. Patient's port will need to be coordinated around a possible plt infusion. I left a msg with Emily at Dr. Dwyane Luo office to explain that if plts are required, the must be ordered 24 hrs in advanced. Pt was able to get her plts from [facility] for the last infusion. The plts are only good for 4 hours upon release from the blood bank in [place]. Pt has had multiple infusion reactions to IV plts and the plts have to be specifically prepped for her to prevent an reaction.  Raquel Sarna will give Dr. Bary Castilla the msg and their office will reach out to me tomorrow.  Patient also inquired if she could stop her Promacta and steroids. I spoke with Dr. Rogue Bussing. V/o given to have patient stop these medi cation after today.  Discussed also with patient that her bone marrow biopsy would be post-poned until next Wed. 04/10/17. I explained that Dr. Rogue Bussing would like to send her bone marrow biopsy tissue/aspiration to [facility]. Per Butch Penny in [place] pathology, this test can not be performed on a Friday due to courier concerns. Pt made aware and agrees. I explained the purpose of this test. I contacted Juanita, RN in special procedures. She also spoke with the patient while she was in the clinic to provide the bone marrow biopsy instructions and apt times.  Pt aware that the bone marrow biposy results will take approximately 7+ days to result. The [facility] may have a longer turn around time. Pt gave verbal understanding. She has a tentative apt on 2/7 for bone marrow results.  She did state that she does  not want the port and bone marrow on the same day. She also stressed that she only wanted Dr. Bary Castilla to place her port. I reassured pt at that time that I would arrange an apt with Dr. Bary Castilla to discuss a port placement.  Will tentatively plan for lab/b12 injection on Monday per Dr. Rogue Bussing and plan for possible plts infusion on Tuesday at 1:30pm.  Bone marrow biopsy will be on 04/10/17.  Also adjustments will be made on pt's future apts for day 1 lab and day 2 possible plt infusion. Pt aware of the plan of care.   I also spoke with Shelly in blood bank. She states that 2 tall lav. Tubes and 2 plain red tubes must be drawn from the patient every time the comes in the clinic for possible plts. This is to safely crossmatch the patient's plts.  I communicated this to Tracie in the cancer center lab. I have also entered a comment on the hold tube for this lab request.

## 2017-04-04 ENCOUNTER — Inpatient Hospital Stay: Payer: Self-pay

## 2017-04-04 ENCOUNTER — Encounter: Payer: Self-pay | Admitting: General Surgery

## 2017-04-04 ENCOUNTER — Telehealth: Payer: Self-pay | Admitting: *Deleted

## 2017-04-04 ENCOUNTER — Inpatient Hospital Stay: Payer: Medicare Other

## 2017-04-04 ENCOUNTER — Inpatient Hospital Stay: Payer: Medicare Other | Admitting: Nurse Practitioner

## 2017-04-04 ENCOUNTER — Ambulatory Visit (INDEPENDENT_AMBULATORY_CARE_PROVIDER_SITE_OTHER): Payer: Medicare Other | Admitting: General Surgery

## 2017-04-04 VITALS — BP 130/78 | HR 86 | Resp 14 | Ht 61.0 in | Wt 139.0 lb

## 2017-04-04 DIAGNOSIS — C50919 Malignant neoplasm of unspecified site of unspecified female breast: Secondary | ICD-10-CM

## 2017-04-04 DIAGNOSIS — D696 Thrombocytopenia, unspecified: Secondary | ICD-10-CM

## 2017-04-04 NOTE — Patient Instructions (Signed)
Follow up appointment to be announced.  

## 2017-04-04 NOTE — Progress Notes (Signed)
Patient ID: Shelly Arias, female   DOB: [birth year], 72 y.o.   MRN: [id number]  Chief Complaint  Patient presents with  . Follow-up    HPI Shelly Arias is a 72 y.o. female here today for a evaluation of a port placement. Patient states she has been having low platelet. She has been to multiple blood transfusion and two PET scans. Patient is requiring frequent platelet transfusions.  Etiology unclear even after 2 bone marrows and 3 outside consultation.  Port placement has been requested due to difficult with peripheral access.  HPI  Past Medical History:  Diagnosis Date  . Breast cancer ([place])    right, lumpectomy, radiation, chemo  . Breast cancer ([place])    Right, 2007  . 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  . Headache   . History of chemotherapy   . History of radiation therapy   . Hyperlipidemia   . Hypertension   . Osteoarthritis    right hip  . Osteoporosis   . 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 ABDOMINAL HYSTERECTOMY    . BREAST BIOPSY Left 2002   left breast, calcifications  . BREAST BIOPSY Left 06/20/2016   INVASIVE DUCTAL CARCINOMA.  Marland Kitchen BREAST EXCISIONAL BIOPSY Right 2007   positive  . bunion repair    . ESOPHAGOGASTRODUODENOSCOPY (EGD) WITH PROPOFOL N/A 08/05/2016   Procedure: ESOPHAGOGASTRODUODENOSCOPY (EGD) WITH PROPOFOL;  Surgeon: San Jetty, MD;  Location: [facility] ENDOSCOPY;  Service: General;  Laterality: N/A;  . left breast biopsy    . NASAL SINUS SURGERY    . right hip replacement    . SHOULDER SURGERY    . SQUAMOUS CELL CARCINOMA EXCISION     right leg, Dr. Nicole Kindred  . TOTAL HIP ARTHROPLASTY     right    Family History  Problem Relation Age of Onset  . Heart disease Father 62  . Heart disease Mother   . Hyperlipidemia Mother   . Heart disease Brother   . Diabetes Brother   . Diabetes Maternal Grandmother   . Breast cancer Cousin    . Kidney disease Brother     Social History Social History   Tobacco Use  . Smoking status: Current Some Day Smoker    Packs/day: 0.10    Years: 30.00    Pack years: 3.00    Types: Cigarettes  . Smokeless tobacco: Never Used  Substance Use Topics  . Alcohol use: Yes    Alcohol/week: 0.0 oz    Comment: socially  . Drug use: No    Allergies  Allergen Reactions  . Codeine Anaphylaxis  . Fish-Derived Products Anaphylaxis  . Morphine Sulfate Anaphylaxis    REACTION: anaphylaxis  . Fish Allergy   . Morphine Sulfate     REACTION: anaphylaxis  . Oxycodone     Nausea and vomiting    Current Outpatient Medications  Medication Sig Dispense Refill  . ALPRAZolam (XANAX) 0.5 MG tablet TAKE 1 TABLET BY MOUTH EVERY 8 HOURS AS NEEDED FOR ANXIETY OR SLEEP (Patient taking differently: take 1 tablet by mouth at bedtime) 30 tablet 2  . ergocalciferol (VITAMIN D2) 50000 units capsule Take 1 capsule (50,000 Units total) by mouth once a week. 12 capsule 1  . Fexofenadine HCl (ALLEGRA PO) Take 1 capsule by mouth.    Marland Kitchen glucosamine-chondroitin 500-400 MG tablet Take 1 tablet by mouth daily.     Marland Kitchen  letrozole (FEMARA) 2.5 MG tablet Take 1 tablet (2.5 mg total) by mouth daily. Once a day. 90 tablet 3  . metoprolol succinate (TOPROL-XL) 25 MG 24 hr tablet Take 1 tablet (25 mg total) by mouth daily. 90 tablet 3  . pravastatin (PRAVACHOL) 40 MG tablet Take 1 tablet (40 mg total) by mouth daily. 90 tablet 3  . Probiotic Product (PROBIOTIC & ACIDOPHILUS EX ST PO) Take 1 tablet by mouth daily.     . vitamin B-12 (CYANOCOBALAMIN) 1000 MCG tablet Take 1,000 mcg by mouth daily.     No current facility-administered medications for this visit.     Review of Systems Review of Systems  Constitutional: Negative.   Respiratory: Negative.   Cardiovascular: Negative.     Blood pressure 130/78, pulse 86, resp. rate 14, height 5\' 1"  (1.549 m), weight 139 lb (63 kg).  Physical Exam Physical Exam   Constitutional: She is oriented to person, place, and time. She appears well-developed and well-nourished.  Cardiovascular: Normal rate, regular rhythm and normal heart sounds.  Pulmonary/Chest: Effort normal and breath sounds normal. Right breast exhibits no inverted nipple, no mass, no nipple discharge, no skin change and no tenderness. Left breast exhibits no inverted nipple, no mass, no nipple discharge, no skin change and no tenderness.    Neurological: She is alert and oriented to person, place, and time.  Skin: Skin is warm and dry.    Data Reviewed Most recent transfusion of platelets resulted in a platelet count of 80,000.  Personal discussion with Dr. Rogue Bussing.    Ultrasound examination of the bicipital groove did not show an adequate vein for peripheral cannulation.  The right internal jugular vein is widely patent.  Assessment    Thrombocytopenia, need for central venous access.         Plan  In discussion with medical oncology, 2 days notice will be required to obtain washed platelets for infusion the morning of the procedure.  We will likely choose the right IJ to minimize the risk of bleeding even with a normal platelet count.  Discussed port placement with patient. The patient is aware to call back for any questions or concerns.   HPI, Physical Exam, Assessment and Plan have been scribed under the direction and in the presence of Hervey Ard, MD.  Gaspar Cola, CMA  I have completed the exam and reviewed the above documentation for accuracy and completeness.  I agree with the above.  Haematologist has been used and any errors in dictation or transcription are unintentional.  Hervey Ard, M.D., F.A.C.S.  Forest Gleason Adante [place]ington 04/06/2017, 7:32 AM

## 2017-04-04 NOTE — Telephone Encounter (Signed)
Shelly Arias from [place] at [facility] left vm to talk to Dr B [phone].  I returned the phone call to Shelly Arias at [facility] pathology. Discussed that bone marrow biopsy was r/s for the patient on 04/10/17.

## 2017-04-05 ENCOUNTER — Encounter: Payer: Self-pay | Admitting: Internal Medicine

## 2017-04-05 ENCOUNTER — Ambulatory Visit: Admission: RE | Admit: 2017-04-05 | Payer: Medicare Other | Source: Ambulatory Visit

## 2017-04-06 LAB — PREPARE PLATELET PHERESIS: Unit division: 0

## 2017-04-06 LAB — SAMPLE TO BLOOD BANK

## 2017-04-06 LAB — BPAM PLATELET PHERESIS
BLOOD PRODUCT EXPIRATION DATE: 201901221430
ISSUE DATE / TIME: 201901221338
UNIT TYPE AND RH: 5100

## 2017-04-08 ENCOUNTER — Telehealth: Payer: Self-pay | Admitting: *Deleted

## 2017-04-08 ENCOUNTER — Telehealth: Payer: Self-pay | Admitting: Internal Medicine

## 2017-04-08 ENCOUNTER — Inpatient Hospital Stay: Payer: Medicare Other

## 2017-04-08 ENCOUNTER — Other Ambulatory Visit: Payer: Self-pay | Admitting: General Surgery

## 2017-04-08 ENCOUNTER — Other Ambulatory Visit: Payer: Self-pay | Admitting: *Deleted

## 2017-04-08 DIAGNOSIS — Z170 Estrogen receptor positive status [ER+]: Secondary | ICD-10-CM

## 2017-04-08 DIAGNOSIS — C50811 Malignant neoplasm of overlapping sites of right female breast: Secondary | ICD-10-CM

## 2017-04-08 DIAGNOSIS — R21 Rash and other nonspecific skin eruption: Secondary | ICD-10-CM | POA: Diagnosis not present

## 2017-04-08 DIAGNOSIS — C7801 Secondary malignant neoplasm of right lung: Secondary | ICD-10-CM | POA: Diagnosis not present

## 2017-04-08 DIAGNOSIS — D469 Myelodysplastic syndrome, unspecified: Secondary | ICD-10-CM

## 2017-04-08 DIAGNOSIS — Z79818 Long term (current) use of other agents affecting estrogen receptors and estrogen levels: Secondary | ICD-10-CM | POA: Diagnosis not present

## 2017-04-08 DIAGNOSIS — D693 Immune thrombocytopenic purpura: Secondary | ICD-10-CM

## 2017-04-08 DIAGNOSIS — D649 Anemia, unspecified: Secondary | ICD-10-CM

## 2017-04-08 DIAGNOSIS — D696 Thrombocytopenia, unspecified: Secondary | ICD-10-CM

## 2017-04-08 LAB — CBC WITH DIFFERENTIAL/PLATELET
BASOS ABS: 0 10*3/uL (ref 0–0.1)
BASOS PCT: 0 %
EOS PCT: 0 %
Eosinophils Absolute: 0 10*3/uL (ref 0–0.7)
HEMATOCRIT: 21.5 % — AB (ref 35.0–47.0)
Hemoglobin: 7.5 g/dL — ABNORMAL LOW (ref 12.0–16.0)
Lymphocytes Relative: 49 %
Lymphs Abs: 1.8 10*3/uL (ref 1.0–3.6)
MCH: 32.9 pg (ref 26.0–34.0)
MCHC: 34.7 g/dL (ref 32.0–36.0)
MCV: 95 fL (ref 80.0–100.0)
MONO ABS: 0.2 10*3/uL (ref 0.2–0.9)
MONOS PCT: 5 %
NEUTROS ABS: 1.7 10*3/uL (ref 1.4–6.5)
Neutrophils Relative %: 46 %
PLATELETS: 28 10*3/uL — AB (ref 150–400)
RBC: 2.26 MIL/uL — ABNORMAL LOW (ref 3.80–5.20)
RDW: 21.6 % — AB (ref 11.5–14.5)
WBC: 3.8 10*3/uL (ref 3.6–11.0)

## 2017-04-08 LAB — PREPARE RBC (CROSSMATCH)

## 2017-04-08 LAB — SAMPLE TO BLOOD BANK

## 2017-04-08 MED ORDER — CYANOCOBALAMIN 1000 MCG/ML IJ SOLN
1000.0000 ug | Freq: Once | INTRAMUSCULAR | Status: AC
  Administered 2017-04-08: 1000 ug via INTRAMUSCULAR
  Filled 2017-04-08: qty 1

## 2017-04-08 NOTE — Progress Notes (Signed)
The patient has thrombocytopenia of unknown origin requiring frequent platelet transfusion.  Central venous access has been requested.  The patient will receive a platelet transfusion the morning of surgery and then proceed to right internal jugular central venous line placement.

## 2017-04-08 NOTE — Telephone Encounter (Signed)
Patient's port placement has been scheduled for Friday, 04-12-17 at [facility]. We will be coordinating with medical oncology. Dr. Rogue Bussing did speak with Dr. Bary Castilla this morning. Patient will need platelets the morning of surgery. Renita Papa, RN notified so she can arrange accordingly.   Patient is aware to have no solids after midnight the evening prior to surgery. Also, told to have no clear liquids after 10 am.   The patient is aware she will need a driver day of surgery.   The patient was instructed to call the office should she have further questions. She verbalizes understanding.

## 2017-04-08 NOTE — Telephone Encounter (Signed)
Bone marrow planned on wed/ 1-30; port planned on 2/1 afternoon. Will plan platelet trasfusion accordingly. Discussed with Dr.Byrnett.

## 2017-04-08 NOTE — Telephone Encounter (Signed)
noted 

## 2017-04-09 ENCOUNTER — Other Ambulatory Visit: Payer: Self-pay | Admitting: General Surgery

## 2017-04-09 ENCOUNTER — Inpatient Hospital Stay: Payer: Medicare Other

## 2017-04-09 DIAGNOSIS — Z170 Estrogen receptor positive status [ER+]: Principal | ICD-10-CM

## 2017-04-09 DIAGNOSIS — R21 Rash and other nonspecific skin eruption: Secondary | ICD-10-CM | POA: Diagnosis not present

## 2017-04-09 DIAGNOSIS — D649 Anemia, unspecified: Secondary | ICD-10-CM

## 2017-04-09 DIAGNOSIS — Z79818 Long term (current) use of other agents affecting estrogen receptors and estrogen levels: Secondary | ICD-10-CM | POA: Diagnosis not present

## 2017-04-09 DIAGNOSIS — D469 Myelodysplastic syndrome, unspecified: Secondary | ICD-10-CM | POA: Diagnosis not present

## 2017-04-09 DIAGNOSIS — C50811 Malignant neoplasm of overlapping sites of right female breast: Secondary | ICD-10-CM

## 2017-04-09 DIAGNOSIS — C7801 Secondary malignant neoplasm of right lung: Secondary | ICD-10-CM | POA: Diagnosis not present

## 2017-04-09 MED ORDER — SODIUM CHLORIDE 0.9 % IV SOLN
250.0000 mL | Freq: Once | INTRAVENOUS | Status: AC
  Administered 2017-04-09: 250 mL via INTRAVENOUS
  Filled 2017-04-09: qty 250

## 2017-04-09 MED ORDER — ACETAMINOPHEN 325 MG PO TABS
650.0000 mg | ORAL_TABLET | Freq: Once | ORAL | Status: AC
  Administered 2017-04-09: 650 mg via ORAL
  Filled 2017-04-09: qty 2

## 2017-04-09 MED ORDER — DIPHENHYDRAMINE HCL 25 MG PO CAPS
25.0000 mg | ORAL_CAPSULE | Freq: Once | ORAL | Status: AC
  Administered 2017-04-09: 25 mg via ORAL
  Filled 2017-04-09: qty 1

## 2017-04-10 ENCOUNTER — Ambulatory Visit
Admission: RE | Admit: 2017-04-10 | Discharge: 2017-04-10 | Disposition: A | Discharge status: Home / Self Care | Payer: Medicare Other | Source: Ambulatory Visit | Attending: Internal Medicine | Admitting: Internal Medicine

## 2017-04-10 ENCOUNTER — Other Ambulatory Visit: Payer: Self-pay

## 2017-04-10 ENCOUNTER — Other Ambulatory Visit (HOSPITAL_COMMUNITY)
Admission: RE | Admit: 2017-04-10 | Disposition: A | Discharge status: Home / Self Care | Payer: Medicare Other | Source: Ambulatory Visit | Attending: Internal Medicine | Admitting: Internal Medicine

## 2017-04-10 ENCOUNTER — Encounter
Admission: RE | Admit: 2017-04-10 | Discharge: 2017-04-10 | Disposition: A | Discharge status: Home / Self Care | Payer: Medicare Other | Source: Ambulatory Visit | Attending: General Surgery | Admitting: General Surgery

## 2017-04-10 DIAGNOSIS — Z9221 Personal history of antineoplastic chemotherapy: Secondary | ICD-10-CM | POA: Insufficient documentation

## 2017-04-10 DIAGNOSIS — I10 Essential (primary) hypertension: Secondary | ICD-10-CM | POA: Diagnosis not present

## 2017-04-10 DIAGNOSIS — M1611 Unilateral primary osteoarthritis, right hip: Secondary | ICD-10-CM | POA: Diagnosis not present

## 2017-04-10 DIAGNOSIS — F17210 Nicotine dependence, cigarettes, uncomplicated: Secondary | ICD-10-CM | POA: Diagnosis not present

## 2017-04-10 DIAGNOSIS — M810 Age-related osteoporosis without current pathological fracture: Secondary | ICD-10-CM | POA: Insufficient documentation

## 2017-04-10 DIAGNOSIS — C7952 Secondary malignant neoplasm of bone marrow: Secondary | ICD-10-CM | POA: Diagnosis not present

## 2017-04-10 DIAGNOSIS — Z923 Personal history of irradiation: Secondary | ICD-10-CM | POA: Diagnosis not present

## 2017-04-10 DIAGNOSIS — D61818 Other pancytopenia: Secondary | ICD-10-CM | POA: Diagnosis not present

## 2017-04-10 DIAGNOSIS — D696 Thrombocytopenia, unspecified: Secondary | ICD-10-CM | POA: Insufficient documentation

## 2017-04-10 DIAGNOSIS — Z79899 Other long term (current) drug therapy: Secondary | ICD-10-CM | POA: Insufficient documentation

## 2017-04-10 DIAGNOSIS — E785 Hyperlipidemia, unspecified: Secondary | ICD-10-CM | POA: Insufficient documentation

## 2017-04-10 DIAGNOSIS — D72820 Lymphocytosis (symptomatic): Secondary | ICD-10-CM | POA: Insufficient documentation

## 2017-04-10 DIAGNOSIS — Z85828 Personal history of other malignant neoplasm of skin: Secondary | ICD-10-CM | POA: Insufficient documentation

## 2017-04-10 DIAGNOSIS — Z853 Personal history of malignant neoplasm of breast: Secondary | ICD-10-CM | POA: Diagnosis not present

## 2017-04-10 DIAGNOSIS — C801 Malignant (primary) neoplasm, unspecified: Secondary | ICD-10-CM | POA: Diagnosis not present

## 2017-04-10 DIAGNOSIS — Z170 Estrogen receptor positive status [ER+]: Secondary | ICD-10-CM

## 2017-04-10 DIAGNOSIS — C50811 Malignant neoplasm of overlapping sites of right female breast: Secondary | ICD-10-CM

## 2017-04-10 HISTORY — DX: Chronic obstructive pulmonary disease, unspecified: J44.9

## 2017-04-10 HISTORY — DX: Gastro-esophageal reflux disease without esophagitis: K21.9

## 2017-04-10 HISTORY — DX: Other complications of anesthesia, initial encounter: T88.59XA

## 2017-04-10 HISTORY — DX: Adverse effect of unspecified anesthetic, initial encounter: T41.45XA

## 2017-04-10 HISTORY — DX: Disease of blood and blood-forming organs, unspecified: D75.9

## 2017-04-10 HISTORY — DX: Other pulmonary collapse: J98.19

## 2017-04-10 HISTORY — DX: Personal history of Methicillin resistant Staphylococcus aureus infection: Z86.14

## 2017-04-10 LAB — TYPE AND SCREEN
ABO/RH(D): O POS
Antibody Screen: POSITIVE
Donor AG Type: NEGATIVE
UNIT DIVISION: 0

## 2017-04-10 LAB — CBC WITH DIFFERENTIAL/PLATELET
BASOS PCT: 0 %
Basophils Absolute: 0 10*3/uL (ref 0–0.1)
EOS PCT: 1 %
Eosinophils Absolute: 0 10*3/uL (ref 0–0.7)
HCT: 26.3 % — ABNORMAL LOW (ref 35.0–47.0)
Hemoglobin: 8.8 g/dL — ABNORMAL LOW (ref 12.0–16.0)
Lymphocytes Relative: 40 %
Lymphs Abs: 1.1 10*3/uL (ref 1.0–3.6)
MCH: 31.8 pg (ref 26.0–34.0)
MCHC: 33.6 g/dL (ref 32.0–36.0)
MCV: 94.7 fL (ref 80.0–100.0)
Monocytes Absolute: 0.1 10*3/uL — ABNORMAL LOW (ref 0.2–0.9)
Monocytes Relative: 5 %
Neutro Abs: 1.5 10*3/uL (ref 1.4–6.5)
Neutrophils Relative %: 54 %
PLATELETS: 16 10*3/uL — AB (ref 150–440)
RBC: 2.78 MIL/uL — AB (ref 3.80–5.20)
RDW: 18.6 % — ABNORMAL HIGH (ref 11.5–14.5)
WBC: 2.8 10*3/uL — AB (ref 3.6–11.0)

## 2017-04-10 LAB — PROTIME-INR
INR: 0.93
PROTHROMBIN TIME: 12.4 s (ref 11.4–15.2)

## 2017-04-10 LAB — BPAM RBC
BLOOD PRODUCT EXPIRATION DATE: 201902102359
ISSUE DATE / TIME: 201901291358
UNIT TYPE AND RH: 5100

## 2017-04-10 MED ORDER — FENTANYL CITRATE (PF) 100 MCG/2ML IJ SOLN
INTRAMUSCULAR | Status: AC | PRN
  Administered 2017-04-10: 50 ug via INTRAVENOUS
  Administered 2017-04-10: 25 ug via INTRAVENOUS

## 2017-04-10 MED ORDER — FENTANYL CITRATE (PF) 100 MCG/2ML IJ SOLN
INTRAMUSCULAR | Status: AC
  Filled 2017-04-10: qty 4

## 2017-04-10 MED ORDER — MIDAZOLAM HCL 5 MG/5ML IJ SOLN
INTRAMUSCULAR | Status: AC
  Filled 2017-04-10: qty 5

## 2017-04-10 MED ORDER — SODIUM CHLORIDE 0.9 % IV SOLN
INTRAVENOUS | Status: DC
  Administered 2017-04-10: 1000 mL via INTRAVENOUS

## 2017-04-10 MED ORDER — MIDAZOLAM HCL 5 MG/5ML IJ SOLN
INTRAMUSCULAR | Status: AC | PRN
  Administered 2017-04-10 (×2): 1 mg via INTRAVENOUS

## 2017-04-10 MED ORDER — HEPARIN SOD (PORK) LOCK FLUSH 100 UNIT/ML IV SOLN
INTRAVENOUS | Status: AC
  Filled 2017-04-10: qty 5

## 2017-04-10 MED ORDER — LIDOCAINE HCL (PF) 1 % IJ SOLN
INTRAMUSCULAR | Status: AC | PRN
  Administered 2017-04-10: 8 mL

## 2017-04-10 NOTE — Procedures (Signed)
Interventional Radiology Procedure Note  Procedure: CT guided aspirate and core biopsy of right posterior iliac bone Complications: None Recommendations: - Bedrest supine x 1 hrs - OTC's PRN  Pain - Follow biopsy results  Signed,  Sigifredo Pignato S. Makaylah Oddo, DO    

## 2017-04-10 NOTE — Patient Instructions (Signed)
Your procedure is scheduled on: 04-12-17 FRIDAY Report to Same Day Surgery 2nd floor medical mall [facility] Entrance-take elevator on left to 2nd floor.  Check in with surgery information desk.) To find out your arrival time please call [phone] between 1PM - 3PM on 04-11-17 THURSDAY  Remember: Instructions that are not followed completely may result in serious medical risk, up to and including death, or upon the discretion of your surgeon and anesthesiologist your surgery may need to be rescheduled.    _x___ 1. Do not eat food after midnight the night before your procedure. NO GUM OR CANDY AFTER MIDNIGHT.  You may drink clear liquids up to 2 hours before you are scheduled to arrive at the hospital for your procedure.  Do not drink clear liquids within 2 hours of your scheduled arrival to the hospital.  Clear liquids include  --Water or Apple juice without pulp  --Clear carbohydrate beverage such as ClearFast or Gatorade  --Black Coffee or Clear Tea (No milk, no creamers, do not add anything to the coffee or Tea      __x__ 2. No Alcohol for 24 hours before or after surgery.   __x__3. No Smoking for 24 prior to surgery.   ____  4. Bring all medications with you on the day of surgery if instructed.    __x__ 5. Notify your doctor if there is any change in your medical condition     (cold, fever, infections).     Do not wear jewelry, make-up, hairpins, clips or nail polish.  Do not wear lotions, powders, or perfumes. You may wear deodorant.  Do not shave 48 hours prior to surgery. Men may shave face and neck.  Do not bring valuables to the hospital.    [facility] is not responsible for any belongings or valuables.               Contacts, dentures or bridgework may not be worn into surgery.  Leave your suitcase in the car. After surgery it may be brought to your room.  For patients admitted to the hospital, discharge time is determined by your treatment team.   Patients  discharged the day of surgery will not be allowed to drive home.  You will need someone to drive you home and stay with you the night of your procedure.    Please read over the following fact sheets that you were given:   [facility] Preparing for Surgery and or MRSA Information   _x___ TAKE THE FOLLOWING MEDICATION THE MORNING OF SURGERY WITH A SMALL SIP OF WATER. These include:  1. METOPROLOL  2. ALLEGRA  3. ZANTAC  4. TAKE AN EXTRA ZANTAC Thursday NIGHT BEFORE BED  5.  6.  ____Fleets enema or Magnesium Citrate as directed.   _x___ Use CHG Soap or sage wipes as directed on instruction sheet   ____ Use inhalers on the day of surgery and bring to hospital day of surgery  ____ Stop Metformin and Janumet 2 days prior to surgery.    ____ Take 1/2 of usual insulin dose the night before surgery and none on the morning surgery.   ____ Follow recommendations from Cardiologist, Pulmonologist or PCP regarding stopping Aspirin, Coumadin, Plavix ,Eliquis, Effient, or Pradaxa, and Pletal.  ____Stop Anti-inflammatories such as Advil, Aleve, Ibuprofen, Motrin, Naproxen, Naprosyn, Goodies powders or aspirin products. OK to take Tylenol    _x___ Stop supplements until after surgery-STOP GLUCOSAMINE-CHONDROITIN NOW-MAY RESUME AFTER SURGERY   ____ Bring C-Pap to the  hospital.

## 2017-04-10 NOTE — H&P (Signed)
Chief Complaint: Thrombocytopenia  Referring Physician(s): [name]  Supervising Physician: Corrie Mckusick  Patient Status: [facility] - Out-pt  History of Present Illness: Shelly Arias is a 72 y.o. female presenting for imaging guided bone marrow biopsy, with history of thrombocytopenia.   Past Medical History:  Diagnosis Date  . Breast cancer ([place])    right, lumpectomy, radiation, chemo  . Breast cancer ([place])    Right, 2007  . 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  . Headache   . History of chemotherapy   . History of radiation therapy   . Hyperlipidemia   . Hypertension   . Osteoarthritis    right hip  . Osteoporosis   . 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 ABDOMINAL HYSTERECTOMY    . BREAST BIOPSY Left 2002   left breast, calcifications  . BREAST BIOPSY Left 06/20/2016   INVASIVE DUCTAL CARCINOMA.  Marland Kitchen BREAST EXCISIONAL BIOPSY Right 2007   positive  . bunion repair    . ESOPHAGOGASTRODUODENOSCOPY (EGD) WITH PROPOFOL N/A 08/05/2016   Procedure: ESOPHAGOGASTRODUODENOSCOPY (EGD) WITH PROPOFOL;  Surgeon: San Jetty, MD;  Location: [facility] ENDOSCOPY;  Service: General;  Laterality: N/A;  . left breast biopsy    . NASAL SINUS SURGERY    . right hip replacement    . SHOULDER SURGERY    . SQUAMOUS CELL CARCINOMA EXCISION     right leg, Dr. Nicole Kindred  . TOTAL HIP ARTHROPLASTY     right    Allergies: Codeine; Fish-derived products; Morphine sulfate; and Oxycodone  Medications: Prior to Admission medications   Medication Sig Start Date End Date Taking? Authorizing Provider  ALPRAZolam (XANAX) 0.5 MG tablet TAKE 1 TABLET BY MOUTH EVERY 8 HOURS AS NEEDED FOR ANXIETY OR SLEEP Patient taking differently: take 1 tablet by mouth at bedtime 02/28/17  Yes Verlon Au, NP  ergocalciferol (VITAMIN D2) 50000 units capsule Take 1 capsule (50,000 Units total) by  mouth once a week. Patient taking differently: Take 50,000 Units by mouth every Saturday. In the morning. 12/17/16  Yes [name], MD  fexofenadine (ALLEGRA) 180 MG tablet Take 180 mg by mouth daily.   Yes [provider]  Glucosamine-Chondroitin (COSAMIN DS PO) Take 1 tablet by mouth daily. In the morning.   Yes [provider]  letrozole (FEMARA) 2.5 MG tablet Take 1 tablet (2.5 mg total) by mouth daily. Once a day. 07/02/16  Yes [name], MD  metoprolol succinate (TOPROL-XL) 25 MG 24 hr tablet Take 1 tablet (25 mg total) by mouth daily. 05/11/16  Yes Cook, Jayce G, DO  neomycin-polymyxin-pramoxine (NEOSPORIN PLUS) 1 % cream Apply 1 application topically 2 (two) times daily as needed (Nasal passages as needed for dryness.).   Yes [provider]  pravastatin (PRAVACHOL) 40 MG tablet Take 1 tablet (40 mg total) by mouth daily. Patient taking differently: Take 40 mg by mouth at bedtime.  05/11/16  Yes Cook, Jayce G, DO  Probiotic Product (PROBIOTIC & ACIDOPHILUS EX ST PO) Take 1 tablet by mouth daily.    Yes [provider]  ranitidine (ZANTAC) 75 MG tablet Take 75 mg by mouth daily.   Yes [provider]  cyanocobalamin (,VITAMIN B-12,) 1000 MCG/ML injection Inject 1,000 mcg into the muscle every 30 (thirty) days.    [provider]  vitamin B-12 (CYANOCOBALAMIN) 1000 MCG tablet Take 1,000 mcg by mouth daily.  [provider]     Family History  Problem Relation Age of Onset  . Heart disease Father 37  . Heart disease Mother   . Hyperlipidemia Mother   . Heart disease Brother   . Diabetes Brother   . Diabetes Maternal Grandmother   . Breast cancer Cousin   . Kidney disease Brother     Social History   Socioeconomic History  . Marital status: Married    Spouse name: None  . Number of children: None  . Years of education: None  . Highest education level: None  Social Needs  . Financial resource  strain: Not hard at all  . Food insecurity - worry: None  . Food insecurity - inability: None  . Transportation needs - medical: No  . Transportation needs - non-medical: No  Occupational History  . None  Tobacco Use  . Smoking status: Current Some Day Smoker    Packs/day: 0.10    Years: 30.00    Pack years: 3.00    Types: Cigarettes  . Smokeless tobacco: Never Used  Substance and Sexual Activity  . Alcohol use: Yes    Alcohol/week: 0.0 oz    Comment: socially  . Drug use: No  . Sexual activity: No  Other Topics Concern  . None  Social History Narrative  . None      Review of Systems: A 12 point ROS discussed and pertinent positives are indicated in the HPI above.  All other systems are negative.  Review of Systems  Vital Signs: BP (!) 124/58   Pulse 75   Temp (!) 97 F (36.1 C) (Oral)   Resp 18   Ht _0  (1.549 m)   Wt 139 lb (63 kg)   SpO2 100%   BMI 26.26 kg/m   Physical Exam General: 72 yo female appearing  stated age.  Well-developed, well-nourished.  No distress. HEENT: Atraumatic, normocephalic.  Conjugate gaze, extra-ocular motor intact. No scleral icterus or scleral injection. No lesions on external ears, nose, lips, or gums.  Oral mucosa moist, pink.  Neck: Symmetric with no goiter enlargement.  Chest/Lungs:  Symmetric chest with inspiration/expiration.  No labored breathing.  Clear to auscultation with no wheezes, rhonchi, or rales.  Heart:  RRR, with no third heart sounds appreciated. No JVD appreciated.  Abdomen:  Soft, NT/ND, with + bowel sounds.   Genito-urinary: Deferred Neurologic: Alert & Oriented to person, place, and time.   Normal affect and insight.  Appropriate questions.  Moving all 4 extremities with gross sensory intact.     Imaging: Dg Chest 2 View  Result Date: 03/15/2017 CLINICAL DATA:  Short of breath. Follow-up pneumonia. Breast cancer EXAM: CHEST  2 VIEW COMPARISON:  02/23/2016 FINDINGS: Improving right middle lobe infiltrate  which is essentially returned to normal. No underlying mass lesion COPD with hyperinflation.  Negative for heart failure IMPRESSION: Resolution of right middle lobe pneumonia Electronically Signed   By: Franchot Gallo M.D.   On: 03/15/2017 14:11   US Breast Complete Uni Right Inc Axilla  Result Date: 04/06/2017 Ultrasound examination of the bicipital groove did not show an adequate vein for peripheral cannulation.  The right internal jugular vein is widely patent. No images.   Labs:  CBC: Recent Labs    04/01/17 1132 04/03/17 1050 04/08/17 1140 04/10/17 0920  WBC 4.7 5.3 3.8 2.8*  HGB 9.3* 8.3* 7.5* 8.8*  HCT 26.5* 23.9* 21.5* 26.3*  PLT 6* 80* 28* 16*    COAGS: Recent Labs  06/18/16 0804 06/28/16 1117 07/16/16 0950 08/03/16 1649 03/29/17 1550 04/10/17 0920  INR 0.95 1.02 0.98 1.03 0.99 0.93  APTT 28 <24* <24*  --  26  --     BMP: Recent Labs    08/04/16 0445 01/14/17 1037 02/08/17 0818 02/25/17 1200 03/04/17 0818  NA 130* 134* 134*  --  131*  K 4.0 4.1 4.3  --  3.8  CL 98* 102 101  --  100*  CO2 _0 --  24  GLUCOSE 116* 91 100*  --  101*  BUN _1 --  17  CALCIUM 8.6* 8.8* 8.7*  --  8.6*  CREATININE 0.69 0.66 0.77 1.09* 1.26*  GFRNONAA >60 >60 >60 51* 42*  GFRAA >60 >60 >60 59* 49*    LIVER FUNCTION TESTS: Recent Labs    07/30/16 1258 08/03/16 1453 01/14/17 1037 02/08/17 0818  BILITOT 0.5 0.7 0.7 0.6  AST _2 ALT _3 12*  ALKPHOS 61 64 70 66  PROT 8.1 8.0 6.4* 6.2*  ALBUMIN 3.7 3.7 3.9 3.8    TUMOR MARKERS: No results for input(s): AFPTM, CEA, CA199, CHROMGRNA in the last 8760 hours.  Assessment and Plan:  Ms Portier is 72 yo female with history of thrombocytopenia, presenting for bone marrow biopsy.   We have been asked to retrieve extra aspirate for foundation 1 testing.   Plan to proceed with Ct guided bone biopsy.   Risks and benefits discussed with the patient including, but not limited to bleeding,  infection, damage to adjacent structures or low yield requiring additional tests. All of the patient's questions were answered, patient is agreeable to proceed. Consent signed and in chart.    Thank you for this interesting consult.  I greatly enjoyed meeting Tomasa Dobransky and look forward to participating in their care.  A copy of this report was sent to the requesting provider on this date.  Electronically Signed: Corrie Mckusick, DO 04/10/2017, 10:53 AM   I spent a total of  30 Minutes   in face to face in clinical consultation, greater than 50% of which was counseling/coordinating care for bone marrow biopsy.

## 2017-04-11 ENCOUNTER — Emergency Department
Admission: EM | Admit: 2017-04-11 | Discharge: 2017-04-11 | Disposition: A | Discharge status: Home / Self Care | Payer: Medicare Other | Attending: Emergency Medicine | Admitting: Emergency Medicine

## 2017-04-11 ENCOUNTER — Ambulatory Visit: Payer: Medicare Other

## 2017-04-11 ENCOUNTER — Inpatient Hospital Stay: Payer: Medicare Other

## 2017-04-11 ENCOUNTER — Encounter: Payer: Self-pay | Admitting: Emergency Medicine

## 2017-04-11 ENCOUNTER — Telehealth: Payer: Self-pay | Admitting: *Deleted

## 2017-04-11 ENCOUNTER — Other Ambulatory Visit: Payer: Self-pay

## 2017-04-11 ENCOUNTER — Inpatient Hospital Stay: Admission: RE | Admit: 2017-04-11 | Payer: Medicare Other | Source: Ambulatory Visit

## 2017-04-11 DIAGNOSIS — C7801 Secondary malignant neoplasm of right lung: Secondary | ICD-10-CM | POA: Diagnosis not present

## 2017-04-11 DIAGNOSIS — F17210 Nicotine dependence, cigarettes, uncomplicated: Secondary | ICD-10-CM | POA: Insufficient documentation

## 2017-04-11 DIAGNOSIS — D696 Thrombocytopenia, unspecified: Secondary | ICD-10-CM | POA: Diagnosis not present

## 2017-04-11 DIAGNOSIS — Z7902 Long term (current) use of antithrombotics/antiplatelets: Secondary | ICD-10-CM | POA: Insufficient documentation

## 2017-04-11 DIAGNOSIS — Z96641 Presence of right artificial hip joint: Secondary | ICD-10-CM | POA: Diagnosis not present

## 2017-04-11 DIAGNOSIS — R040 Epistaxis: Secondary | ICD-10-CM

## 2017-04-11 DIAGNOSIS — Z170 Estrogen receptor positive status [ER+]: Principal | ICD-10-CM

## 2017-04-11 DIAGNOSIS — Z853 Personal history of malignant neoplasm of breast: Secondary | ICD-10-CM | POA: Insufficient documentation

## 2017-04-11 DIAGNOSIS — Z85828 Personal history of other malignant neoplasm of skin: Secondary | ICD-10-CM | POA: Diagnosis not present

## 2017-04-11 DIAGNOSIS — C50811 Malignant neoplasm of overlapping sites of right female breast: Secondary | ICD-10-CM

## 2017-04-11 DIAGNOSIS — I10 Essential (primary) hypertension: Secondary | ICD-10-CM | POA: Diagnosis not present

## 2017-04-11 DIAGNOSIS — J449 Chronic obstructive pulmonary disease, unspecified: Secondary | ICD-10-CM | POA: Insufficient documentation

## 2017-04-11 DIAGNOSIS — R21 Rash and other nonspecific skin eruption: Secondary | ICD-10-CM | POA: Diagnosis not present

## 2017-04-11 DIAGNOSIS — D469 Myelodysplastic syndrome, unspecified: Secondary | ICD-10-CM | POA: Diagnosis not present

## 2017-04-11 DIAGNOSIS — Z79899 Other long term (current) drug therapy: Secondary | ICD-10-CM | POA: Insufficient documentation

## 2017-04-11 DIAGNOSIS — Z79818 Long term (current) use of other agents affecting estrogen receptors and estrogen levels: Secondary | ICD-10-CM | POA: Diagnosis not present

## 2017-04-11 LAB — CBC WITH DIFFERENTIAL/PLATELET
Basophils Absolute: 0 10*3/uL (ref 0–0.1)
Basophils Relative: 0 %
Eosinophils Absolute: 0 10*3/uL (ref 0–0.7)
Eosinophils Relative: 1 %
HEMATOCRIT: 23.5 % — AB (ref 35.0–47.0)
HEMOGLOBIN: 8 g/dL — AB (ref 12.0–16.0)
LYMPHS ABS: 0.9 10*3/uL — AB (ref 1.0–3.6)
LYMPHS PCT: 38 %
MCH: 32.1 pg (ref 26.0–34.0)
MCHC: 34 g/dL (ref 32.0–36.0)
MCV: 94.6 fL (ref 80.0–100.0)
MONOS PCT: 6 %
Monocytes Absolute: 0.1 10*3/uL — ABNORMAL LOW (ref 0.2–0.9)
NEUTROS PCT: 55 %
Neutro Abs: 1.3 10*3/uL — ABNORMAL LOW (ref 1.4–6.5)
Platelets: 10 10*3/uL — CL (ref 150–440)
RBC: 2.48 MIL/uL — AB (ref 3.80–5.20)
RDW: 16.9 % — ABNORMAL HIGH (ref 11.5–14.5)
WBC: 2.3 10*3/uL — AB (ref 3.6–11.0)

## 2017-04-11 LAB — COMPREHENSIVE METABOLIC PANEL
ALK PHOS: 58 U/L (ref 38–126)
ALT: 15 U/L (ref 14–54)
AST: 18 U/L (ref 15–41)
Albumin: 3.5 g/dL (ref 3.5–5.0)
Anion gap: 8 (ref 5–15)
BILIRUBIN TOTAL: 0.3 mg/dL (ref 0.3–1.2)
BUN: 11 mg/dL (ref 6–20)
CALCIUM: 8.8 mg/dL — AB (ref 8.9–10.3)
CO2: 26 mmol/L (ref 22–32)
CREATININE: 0.73 mg/dL (ref 0.44–1.00)
Chloride: 102 mmol/L (ref 101–111)
GFR calc non Af Amer: >60 mL/min (ref >60)
Glucose, Bld: 114 mg/dL — ABNORMAL HIGH (ref 65–99)
Potassium: 4.2 mmol/L (ref 3.5–5.1)
SODIUM: 136 mmol/L (ref 135–145)
TOTAL PROTEIN: 6 g/dL — AB (ref 6.5–8.1)

## 2017-04-11 LAB — PROTIME-INR
INR: 0.92
Prothrombin Time: 12.3 seconds (ref 11.4–15.2)

## 2017-04-11 LAB — TYPE AND SCREEN
ABO/RH(D): O POS
Antibody Screen: POSITIVE

## 2017-04-11 MED ORDER — OXYMETAZOLINE HCL 0.05 % NA SOLN
NASAL | Status: AC
  Administered 2017-04-11: 1 via NASAL
  Filled 2017-04-11: qty 15

## 2017-04-11 MED ORDER — OXYMETAZOLINE HCL 0.05 % NA SOLN
1.0000 | Freq: Once | NASAL | Status: AC
  Administered 2017-04-11: 1 via NASAL

## 2017-04-11 MED ORDER — SODIUM CHLORIDE 0.9 % IV SOLN: Freq: Once | INTRAVENOUS | Status: DC

## 2017-04-11 NOTE — Telephone Encounter (Signed)
rn received phone call from blood bank. The pt's donor did not arrive to the blood bank drive on time. plts will not arrive until closer to 3 - 4 pm. Pt has a port placement scheduled after lunch tomorrow. Dr. Rogue Bussing reached Dr. Bary Castilla.  Port a cath will be post poned until next Wednesday. Will tentatively plan for port placement next Wednesday afternoon. Pt will need either plts on Tuesday afternoon or Wednesday morning. I made a lab apt for Monday and held an infusion chair for plts on Tuesday pm and Wed am. Pt aware of the plan. At this time, I do not know when her plts will arrive from blood bank. For this reason, I will hold the apt times for now.  Pt gave verbal understanding.  She states that Dr. Dwyane Luo office has also reached out to her to provide a new apt time.

## 2017-04-11 NOTE — Pre-Procedure Instructions (Addendum)
Called Dr Marcello Moores and informed him current platelet count and hgb.  Dr Marcello Moores wants me to call him back once Dr Bary Castilla reviews pts labs.

## 2017-04-11 NOTE — Telephone Encounter (Signed)
Patient's surgery has been moved from 04-11-17 to 04-17-17 due to not being able to get platelets in time.   Patient is aware and verbalizes understanding.

## 2017-04-11 NOTE — ED Notes (Signed)
Dr. Paduchowski at bedside.  

## 2017-04-11 NOTE — Pre-Procedure Instructions (Signed)
Spoke with Dr Marcello Moores regarding platelet count of 18,000.  Informed Dr Marcello Moores that she will be coming in to the [facility] prior to her surgery to receive platelets.  Dr Marcello Moores said that from Anesthesia's perspective because pt will not be receiving a block or spinal that her platelet count is not an issue.  Ok to proceed

## 2017-04-11 NOTE — ED Notes (Addendum)
Pt alert and oriented X4, active, cooperative, pt in NAD. RR even and unlabored, color WNL.  Pt informed to return if any life threatening symptoms occur.  Discharge and followup instructions reviewed.   Pt to go to [facility] morning per [place] and EDP after speaking with Dr. Rogue Bussing. Pt verbalizes understanding. Instructed to return for any further bleeding.

## 2017-04-11 NOTE — ED Provider Notes (Signed)
[facility] Emergency Department Provider Note  Time seen: 9:03 AM  I have reviewed the triage vital signs and the nursing notes.   HISTORY  Chief Complaint Epistaxis    HPI Hunter Pinkard is a 72 y.o. female with a past medical history of breast cancer status post lumpectomy radiation and chemo in the past, hypertension, hyperlipidemia, low platelet and blood counts currently being seen by hematology/oncology for transfusions, presents to the emergency department for a nosebleed.  According to the patient she typically will get nosebleeds every few weeks.  She states this is the first time she has required coming to the emergency department for a nosebleed as it would not stop at home despite holding pressure and using ice packs.  Patient states 2 days ago she had lab work performed showing a platelet count of 16,000.  Had a blood transfusion done 2 days ago.  Is scheduled for a platelet transfusion tomorrow prior to a port being placed.  States he just underwent a bone marrow biopsy 2 days ago and is awaiting results.  Patient states she has had 2 prior bone marrow biopsies with inconclusive results.  Denies any chest pain, shortness of breath, lightheadedness or dizziness.  Denies abdominal pain nausea or vomiting.  Largely negative review of systems.   Past Medical History:  Diagnosis Date  . Blood dyscrasia   . Breast cancer ([place])    right, lumpectomy, radiation, chemo  . Breast cancer ([place])    Right, 2007  . Breast cancer ([place]) 06/20/2016   INVASIVE DUCTAL CARCINOMA.  . Collapsed lung 02/14/2017   RIGHT  . Complication of anesthesia    PT STATED AFTER BIL HIP SURGERIES SHE STOPPED BREATHING THE NIGHT AFTER SURGERY  . COPD (chronic obstructive pulmonary disease) ([place])   . GERD (gastroesophageal reflux disease)   . Headache   . History of chemotherapy   . History of methicillin resistant staphylococcus aureus (MRSA) 2011  . History of radiation  therapy   . Hyperlipidemia   . Hypertension   . Osteoarthritis    right hip  . Osteoporosis   . Squamous cell carcinoma    leg, Followed by Dr. Nicole Kindred    Patient Active Problem List   Diagnosis Date Noted  . Recurrent productive cough 02/25/2017  . History of blood transfusion reaction 02/08/2017  . Constipation 12/27/2016  . B12 deficiency 10/09/2016  . 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 Acute blood loss anemia 08/04/2016  . GI bleed 08/03/2016  . Thrombocytopenia ([place]) 08/03/2016  . 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 Persistent headaches 07/02/2016  . Mass of upper inner quadrant of left breast 06/20/2016  . Carcinoma of overlapping sites of right breast in female, estrogen receptor positive ([place]) 06/08/2016  . Fatigue 06/08/2016  . Chronic fatigue 06/08/2016  . Nutritional anemia, unspecified 06/08/2016  . Prediabetes 01/25/2016  . Tobacco abuse 11/06/2012  . History of breast cancer 11/06/2012  . Osteopenia 11/08/2011  . Hyperlipidemia 11/02/2010  . Essential hypertension 12/23/2006  . GERD 12/23/2006    Past Surgical History:  Procedure Laterality Date  . ABDOMINAL HYSTERECTOMY    . BREAST BIOPSY Left 2002   left breast, calcifications  . BREAST BIOPSY Left 06/20/2016   INVASIVE DUCTAL CARCINOMA.  Marland Kitchen BREAST EXCISIONAL BIOPSY Right 2007   positive  . bunion repair    . ESOPHAGOGASTRODUODENOSCOPY (EGD) WITH PROPOFOL N/A 08/05/2016   Procedure: ESOPHAGOGASTRODUODENOSCOPY (EGD) WITH PROPOFOL;  Surgeon: San Jetty, MD;  Location: [facility] ENDOSCOPY;  Service:  General;  Laterality: N/A;  . JOINT REPLACEMENT    . left breast biopsy    . NASAL SINUS SURGERY    . right hip replacement    . SHOULDER SURGERY    . SQUAMOUS CELL CARCINOMA EXCISION     right leg, Dr. Nicole Kindred  . TOTAL HIP ARTHROPLASTY     right    Prior to Admission medications   Medication Sig Start Date End Date Taking? Authorizing Provider  ALPRAZolam (XANAX) 0.5 MG tablet TAKE 1 TABLET  BY MOUTH EVERY 8 HOURS AS NEEDED FOR ANXIETY OR SLEEP Patient taking differently: take 1 tablet by mouth at bedtime 02/28/17   Verlon Au, NP  cyanocobalamin (,VITAMIN B-12,) 1000 MCG/ML injection Inject 1,000 mcg into the muscle every 30 (thirty) days.    [provider]  ergocalciferol (VITAMIN D2) 50000 units capsule Take 1 capsule (50,000 Units total) by mouth once a week. Patient taking differently: Take 50,000 Units by mouth every Saturday. In the morning. 12/17/16   [name], MD  fexofenadine (ALLEGRA) 180 MG tablet Take 180 mg by mouth every morning.     [provider]  Glucosamine-Chondroitin (COSAMIN DS PO) Take 1 tablet by mouth daily. In the morning.    [provider]  letrozole (FEMARA) 2.5 MG tablet Take 1 tablet (2.5 mg total) by mouth daily. Once a day. 07/02/16   [name], MD  metoprolol succinate (TOPROL-XL) 25 MG 24 hr tablet Take 1 tablet (25 mg total) by mouth daily. Patient taking differently: Take 25 mg by mouth every morning.  05/11/16   Coral Spikes, DO  neomycin-polymyxin-pramoxine (NEOSPORIN PLUS) 1 % cream Apply 1 application topically 2 (two) times daily as needed (Nasal passages as needed for dryness.).    [provider]  pravastatin (PRAVACHOL) 40 MG tablet Take 1 tablet (40 mg total) by mouth daily. Patient taking differently: Take 40 mg by mouth at bedtime.  05/11/16   Coral Spikes, DO  Probiotic Product (PROBIOTIC & ACIDOPHILUS EX ST PO) Take 1 tablet by mouth daily.     [provider]  ranitidine (ZANTAC) 75 MG tablet Take 75 mg by mouth as needed.     [provider]  vitamin B-12 (CYANOCOBALAMIN) 1000 MCG tablet Take 1,000 mcg by mouth daily.     [provider]    Allergies  Allergen Reactions  . Codeine Anaphylaxis  . Fish-Derived Products Anaphylaxis  . Morphine Sulfate Anaphylaxis    REACTION: anaphylaxis  . Adhesive [Tape]     PAPER TAPE OK TO USE  .  Oxycodone     Nausea and vomiting    Family History  Problem Relation Age of Onset  . Heart disease Father 59  . Heart disease Mother   . Hyperlipidemia Mother   . Heart disease Brother   . Diabetes Brother   . Diabetes Maternal Grandmother   . Breast cancer Cousin   . Kidney disease Brother     Social History Social History   Tobacco Use  . Smoking status: Current Some Day Smoker    Packs/day: 0.10    Years: 30.00    Pack years: 3.00    Types: Cigarettes  . Smokeless tobacco: Never Used  Substance Use Topics  . Alcohol use: Yes    Alcohol/week: 0.0 oz    Comment: socially  . Drug use: No    Review of Systems Constitutional: Negative for fever. Eyes: Negative for visual complaints ENT: Positive for nosebleed  times 3 hours Cardiovascular: Negative for chest pain. Respiratory: Negative for shortness of breath. Gastrointestinal: Negative for abdominal pain, vomiting or nausea. Genitourinary: Negative for urinary compaints Musculoskeletal: Negative for musculoskeletal complaints Skin: Negative for skin complaints  Neurological: Negative for headache All other ROS negative  ____________________________________________   PHYSICAL EXAM:  VITAL SIGNS: ED Triage Vitals  Enc Vitals Group     BP 04/11/17 0817 (!) 172/70     Pulse Rate 04/11/17 0817 (!) 117     Resp --      Temp --      Temp src --      SpO2 --      Weight 04/11/17 0818 140 lb (63.5 kg)     Height --      Head Circumference --      Peak Flow --      Pain Score 04/11/17 0817 0     Pain Loc --      Pain Edu? --      Excl. in [place]? --    Constitutional: Alert and oriented. Well appearing and in no distress. Eyes: Normal exam ENT   Head: Normocephalic and atraumatic.  Patient has blood coming from the left nostril.  Small amount of blood going down the back of her throat.  With nasal speculum I am unable to identify the source of the bleed but she continues to have a mild bleed.  Does not  appear to be any blood in the right nostril.   Mouth/Throat: Mucous membranes are moist. Cardiovascular: Normal rate, regular rhythm. No murmur Respiratory: Normal respiratory effort without tachypnea nor retractions. Breath sounds are clear Gastrointestinal: Soft and nontender. No distention.  Musculoskeletal: Nontender with normal range of motion in all extremities. Neurologic:  Normal speech and language. No gross focal neurologic deficits  Skin:  Skin is warm, dry and intact.  Psychiatric: Mood and affect are normal.   ____________________________________________   INITIAL IMPRESSION / ASSESSMENT AND PLAN / ED COURSE  Pertinent labs & imaging results that were available during my care of the patient were reviewed by me and considered in my medical decision making (see chart for details).  Patient presents to the emergency department with a nosebleed from the left nostril.  History of the same in the past but this 1 will not stop on its own.  Will attempt Afrin administration and closely monitor in the emergency department.  Mild bleed currently.  Patient has a significant history of thrombocytopenia last count was 16,000.  Is scheduled for platelet transfusion tomorrow.  We will discussed with hematology/oncology, will repeat lab work today and continue to closely monitor.   Patient has remained hemostatic after Afrin use for greater than 1 hour at this time.  Patient's labs have resulted showing a platelet count of 10 and hemoglobin of 8.  Hemoglobin is largely unchanged, platelet count continues to trend down.  I discussed the patient with hematology/oncology Dr. Rogue Bussing.  States the patient requires a special type of platelet for transfusion, he is currently attempting to see if it would be possible to transfuse her in the office today.  He will call back and let me know.  Currently the patient appears well, no distress.  No further bleeding times 1 hour after Afrin use.  EKG  obtained for preop purposes as the patient states she was supposed to have an EKG obtained today regardless.  Reviewed and interpreted by myself shows normal sinus rhythm at 90 bpm with a narrow QRS, normal  axis, normal intervals, nonspecific but no concerning ST changes.  I discussed the patient with Dr. Burlene Arnt.  Patient has a platelet count of 10,000 and he believes the patient would benefit from platelet transfusion today.  Due to the patient's antibiotics, etc. she will need special platelet testing to be sent off to the [facility].  I discussed this with Dr. Burlene Arnt as well as the blood bank.  They state send to large [facility] tubes and to red top tubes.  Blood bank states [facility].  The platelets will then be infused this afternoon in the infusion center whenever they become available.  Patient is to call the hematologist around 1 PM for a better timeline.  Patient agreeable to this plan of care.  Blood bank is aware that the platelets will be transfused in the oncology office/transfusion center.  Patient remains hemostatic.  We will discharge from the emergency department after blood is been collected.  ____________________________________________   FINAL CLINICAL IMPRESSION(S) / ED DIAGNOSES  Epistaxis Thrombocytopenia   Harvest Dark, MD 04/11/17 1122

## 2017-04-11 NOTE — ED Notes (Signed)
Pt to be discharged from ER and to return to Cancer center this afternoon for platelet transfusion. Pt verbalizes understanding and is instructed to call Cancer center at approx 1300 to get update on when platelets will be ready. EDP Dr. Harvest Dark at bedside to explain all of this to patient. Family member at bedside verbalizes understanding.

## 2017-04-11 NOTE — Pre-Procedure Instructions (Signed)
Michele from Dr byrnetts office said that Dr Bary Castilla reveiwed pts recent labs and that he is ok with pt proceeding to surgery since she is having platelet transfusion in am prior to port placement.  I called Dr Marcello Moores and relayed this info to him.  He said as long as Dr Bary Castilla is ok with labs then ok to proceed with port placement

## 2017-04-11 NOTE — Discharge Instructions (Signed)
If your nosebleed restarts please spray 2 sprays of Afrin into each nostril and clamp for 15 minutes.  You may repeat this x1 if needed.  If you continue to have bleeding return to the emergency department for evaluation.  As we discussed please call the cancer center by the number provided at 1 PM, to confirm the time and place for the platelet transfusion to occur this afternoon.

## 2017-04-11 NOTE — ED Notes (Addendum)
[facility]bank called and [facility] until tomorrow morning. EDP informed and is calling Dr. Rogue Bussing.

## 2017-04-11 NOTE — ED Triage Notes (Signed)
Patient presents to ED via POV from home due to epistaxis that began 1 hour PTA. Patient has a bleeding disorder. Patient had 1 unit of blood on Tuesday. Patient had a bone marrow biopsy yesterday and is due to have a transfusion tomorrow. Bleeding still continues.

## 2017-04-11 NOTE — Pre-Procedure Instructions (Signed)
Shelly Sickle, MD  Physician  Oncology      Progress Notes  Signed     Encounter Date:  03/27/2017                 Signed          Expand All Collapse All            Expand widget buttonCollapse widget button     Hide copied text   Hover for detailscustomization button                                                                          This [facility] [place]  OFFICE PROGRESS NOTE     Patient Care Team:  Shelly Haven, MD as PCP - General (Family Medicine)  [name], DPM as Consulting Physician (Podiatry)  Shelly Sickle, MD as Consulting Physician (Internal Medicine)  Robert Bellow, MD (General Surgery)        SUMMARY OF ONCOLOGIC HISTORY:         Oncology History        # April 2018- Left chest wall Bx- IMC; ER-PR-POS; her 2 Neu NEG; PET- mild hilar/distal adenopathy; ~1 cm right lung nodule/ several sub-centimeter.      # [facility]; Abemacliclib [on HOLD sec to cytopenia]     # April 2017-isolated Thrombocytopenia- platelets- 113; worsening PANCYTOPENIA- April 2018- BMBx- no evidence of malignancy; MDS/Acute leuk; Karyotype/MDS- FISH panel-NEG [d/w Dr.Smir];      # June 2018- REPEAT BMBx/[facility] [June 2018-Dr.Foster; Aug 2018- Dr.Powell at [facility]]; No Diagnosis.      # May 29th 2018- N-plate- suboptimal improvement; NOV 1st week start promacta 50 mg/day; suboptimal response; DEC 1st- start promacta 75 mg/day     # DEC 18th-INCREASE PROMACTA to 137m/day     # 2006- RIGHT BREAST CA [pT1cN0M0; STAGE I; ER/PRPos; Her 2 Neu-NEG] s/p FEC x 6 [NSABP B-36]; Femara [Dec 2007-2012]     # Osteoporosis s/p Reclast; BMD- 2015-wnl- ca+vit D      Dr.Stewart [derm ? Squamous cell s/p freeze-oct 2018]                  Carcinoma of overlapping sites of right breast in female, estrogen  receptor positive ([facility]         MDS (myelodysplastic syndrome) ([facility]             INTERVAL HISTORY:     A very pleasant 72year old female patient With newly diagnosed recurrent breast cancer- ER/PR positive HER-2/neu negative currently on Femara; And pancytopenia/ with more severe thrombocytopenia- currently Promacta is here for follow-up.     Patient is currently on Promacta 150 mg a day since December 18.  She has not noted significant improvement of her platelets.      She had significant improvement of her symptoms cough.  Complains of shortness of breath with exertion.  No sputum production.     She denies any petechial rash on the legs. She denies any blood in stools black stools or bleeding gums.      She continues to take Femara. No weight loss.  Her appetite is fair.     REVIEW OF SYSTEMS:  A complete 10 point review of system is done which is negative except mentioned above/history of present illness.      PAST MEDICAL HISTORY :        Past Medical History:    Diagnosis   Date    .   Breast cancer ([place])            right, lumpectomy, radiation, chemo    .   Breast cancer ([place])            Right, 2007    .   Breast cancer ([place])   06/20/2016        INVASIVE DUCTAL CARCINOMA.    .   Headache        .   History of chemotherapy        .   History of radiation therapy        .   Hyperlipidemia        .   Hypertension        .   Osteoarthritis            right hip    .   Osteoporosis        .   Squamous cell carcinoma            leg, Followed by Dr. Nicole Kindred          PAST SURGICAL HISTORY :          Past Surgical History:    Procedure   Laterality   Date    .   ABDOMINAL HYSTERECTOMY            .   BREAST BIOPSY   Left   2002        left breast, calcifications    .   BREAST BIOPSY   Left   06/20/2016         INVASIVE DUCTAL CARCINOMA.    Marland Kitchen   BREAST EXCISIONAL BIOPSY   Right   2007        positive    .   bunion repair            .   ESOPHAGOGASTRODUODENOSCOPY (EGD) WITH PROPOFOL   N/A   08/05/2016        Procedure: ESOPHAGOGASTRODUODENOSCOPY (EGD) WITH PROPOFOL;  Surgeon: San Jetty, MD;  Location: [facility] ENDOSCOPY;  Service: General;  Laterality: N/A;    .   left breast biopsy            .   NASAL SINUS SURGERY            .   right hip replacement            .   SHOULDER SURGERY            .   SQUAMOUS CELL CARCINOMA EXCISION                right leg, Dr. Nicole Kindred    .   TOTAL HIP ARTHROPLASTY                right          FAMILY HISTORY :          Family History    Problem   Relation   Age of Onset    .   Heart disease   Father   84    .   Heart disease   Mother        .  Hyperlipidemia   Mother        .   Heart disease   Brother        .   Diabetes   Brother        .   Diabetes   Maternal Grandmother        .   Breast cancer   Cousin        .   Kidney disease   Brother              SOCIAL HISTORY:     Social History             Tobacco Use    .   Smoking status:   Current Some Day Smoker            Packs/day:   0.10            Years:   30.00            Pack years:   3.00            Types:   Cigarettes    .   Smokeless tobacco:   Never Used    Substance Use Topics    .   Alcohol use:   Yes            Alcohol/week:   0.0 oz            Comment: socially    .   Drug use:   No          ALLERGIES:  is allergic to codeine; fish-derived products; morphine sulfate; and oxycodone.     MEDICATIONS:          Current Outpatient Medications    Medication   Sig   Dispense   Refill    .   ALPRAZolam (XANAX) 0.5 MG  tablet   TAKE 1 TABLET BY MOUTH EVERY 8 HOURS AS NEEDED FOR ANXIETY OR SLEEP (Patient taking differently: take 1 tablet by mouth at bedtime)   30 tablet   2    .   ergocalciferol (VITAMIN D2) 50000 units capsule   Take 1 capsule (50,000 Units total) by mouth once a week. (Patient taking differently: Take 50,000 Units by mouth every Saturday. In the morning.)   12 capsule   1    .   letrozole (FEMARA) 2.5 MG tablet   Take 1 tablet (2.5 mg total) by mouth daily. Once a day.   90 tablet   3    .   metoprolol succinate (TOPROL-XL) 25 MG 24 hr tablet   Take 1 tablet (25 mg total) by mouth daily.   90 tablet   3    .   pravastatin (PRAVACHOL) 40 MG tablet   Take 1 tablet (40 mg total) by mouth daily. (Patient taking differently: Take 40 mg by mouth at bedtime. )   90 tablet   3    .   Probiotic Product (PROBIOTIC & ACIDOPHILUS EX ST PO)   Take 1 tablet by mouth daily.             .   cyanocobalamin (,VITAMIN B-12,) 1000 MCG/ML injection   Inject 1,000 mcg into the muscle every 30 (thirty) days.            .   fexofenadine (ALLEGRA) 180 MG tablet   Take 180 mg by mouth daily.            .   Glucosamine-Chondroitin (COSAMIN  DS PO)   Take 1 tablet by mouth daily. In the morning.            .   neomycin-polymyxin-pramoxine (NEOSPORIN PLUS) 1 % cream   Apply 1 application topically 2 (two) times daily as needed (Nasal passages as needed for dryness.).            Marland Kitchen   ranitidine (ZANTAC) 75 MG tablet   Take 75 mg by mouth daily.            .   vitamin B-12 (CYANOCOBALAMIN) 1000 MCG tablet   Take 1,000 mcg by mouth daily.                No current facility-administered medications for this visit.           PHYSICAL EXAMINATION:  ECOG PERFORMANCE STATUS: 0 - Asymptomatic     BP 116/69 (BP Location: Left Arm, Patient Position: Sitting)   Pulse (!) 104   Temp (!) 97.4 F (36.3 C)  (Tympanic)   Resp 16   Wt 139 lb 6.4 oz (63.2 kg)   BMI 26.34 kg/m          Filed Weights        03/27/17 0927    Weight:   139 lb 6.4 oz (63.2 kg)          GENERAL: Well-nourished well-developed; Alert, no distress and comfortable.  She is alone.  EYES: WNL.   OROPHARYNX: no thrush or ulceration; good dentition. No bleeding noted.  NECK: supple, no masses felt  LYMPH:  no palpable lymphadenopathy in the cervical, axillary or inguinal regions  LUNGS: Decreased breath sounds to auscultation bilaterally and  No wheeze or crackles  HEART/CVS: regular rate & rhythm and no murmurs; No lower extremity edema  ABDOMEN:abdomen soft, non-tender and normal bowel sounds  Musculoskeletal:no cyanosis of digits and no clubbing   PSYCH: alert & oriented x 3 with fluent speech  NEURO: no focal motor/sensory deficits  SKIN: Mild chronic ecchymosis.  Not any worse.        LABORATORY DATA:   I have reviewed the data as listed  Labs (Brief)  Recent Labs          Recent [employer]        Labs (Brief)                                                                                                                                       Labs (Brief)                                                                                                                                                                ASSESSMENT & PLAN:      MDS (myelodysplastic syndrome) (HCC)  # Severe thrombocytopenia//anemia- highly suspicious for MDS.  Given the increasing platelet transfusion requirements/worsening anemia-recommend a repeat bone marrow biopsy with foundation 1 testing.  Also repeat [facility] panel-given the recent small clone noted.      # stop Promacta given the lack of significant improvement;      #Skin rash-secondary to platelet transfusion recommend washed platelets/also suspect alloimmunization recommend crossmatch platelets.  Also discussed with Dr. Royce Macadamia.     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 On Femara.  CT scan shows improving right upper lobe lung nodule. Continue femara.      #Mediport planned given poor IV access; we will plan infusion platelets     # weekly cbc/hold tube/platelets; follow up in 3 weeks/MD. also discussed with Dr.Foster.          Shelly Sickle, MD  04/09/2017 6:12 PM  Electronically signed by Shelly Sickle, MD at 04/09/2017  6:12 PM               Office Visit on 03/27/2017                  Detailed Report

## 2017-04-11 NOTE — Pre-Procedure Instructions (Signed)
Called Dr [place] office and spoke with Selinda Eon regarding pt going to ED this am for a uncontrolled nose bleed.  The ED rechecked her platelet count from yesterday (04-10-17) which was 16,000 and now her platelet count is 10,000. Her hgb has gone from 8.8 on 04-10-17 to now 8.0 as of today. Nosebleed now stopped and pt was going to cancer center today at 1300 for platelet transfusion but was unable to get platelets so now pt will get platelet transfusion in am prior to port placement. Selinda Eon will relay info to Dr Bary Castilla.

## 2017-04-11 NOTE — Pre-Procedure Instructions (Signed)
Harvest Dark, MD  Physician  Emergency Medicine      ED Provider Notes  Signed     Date of Service:  04/11/2017  9:02 AM                 Signed                     Expand widget buttonCollapse widget button     Hide copied text   Hover for detailscustomization button                                                            [facility]  Emergency Department Provider Note     Time seen: 9:03 AM     I have reviewed the triage vital signs and the nursing notes.        HISTORY     Chief Complaint  Epistaxis           HPI  Shelly Arias is a 72 y.o. female with a past medical history of breast cancer status post lumpectomy radiation and chemo in the past, hypertension, hyperlipidemia, low platelet and blood counts currently being seen by hematology/oncology for transfusions, presents to the emergency department for a nosebleed.  According to the patient she typically will get nosebleeds every few weeks.  She states this is the first time she has required coming to the emergency department for a nosebleed as it would not stop at home despite holding pressure and using ice packs.  Patient states 2 days ago she had lab work performed showing a platelet count of 16,000.  Had a blood transfusion done 2 days ago.  Is scheduled for a platelet transfusion tomorrow prior to a port being placed.  States he just underwent a bone marrow biopsy 2 days ago and is awaiting results.  Patient states she has had 2 prior bone marrow biopsies with inconclusive results.  Denies any chest pain, shortness of breath, lightheadedness or dizziness.  Denies abdominal pain nausea or vomiting.  Largely negative review of systems.             Past Medical History:    Diagnosis   Date    .   Blood dyscrasia        .   Breast cancer ([place])             right, lumpectomy, radiation, chemo    .   Breast cancer ([place])            Right, 2007    .   Breast cancer ([place])   06/20/2016        INVASIVE DUCTAL CARCINOMA.    .   Collapsed lung   02/14/2017        RIGHT    .   Complication of anesthesia            PT STATED AFTER BIL HIP SURGERIES SHE STOPPED BREATHING THE NIGHT AFTER SURGERY    .   COPD (chronic obstructive pulmonary disease) ([place])        .   GERD (gastroesophageal reflux disease)        .   Headache        .   History of chemotherapy        .  History of methicillin resistant staphylococcus aureus (MRSA)   2011    .   History of radiation therapy        .   Hyperlipidemia        .   Hypertension        .   Osteoarthritis            right hip    .   Osteoporosis        .   Squamous cell carcinoma            leg, Followed by Dr. Nicole Kindred               Patient Active Problem List        Diagnosis   Date Noted    .   Recurrent productive cough   02/25/2017    .   History of blood transfusion reaction   02/08/2017    .   Constipation   12/27/2016    .   B12 deficiency   10/09/2016    .   MDS (myelodysplastic syndrome) ([place])   09/24/2016    .   Acute blood loss anemia   08/04/2016    .   GI bleed   08/03/2016    .   Thrombocytopenia ([place])   08/03/2016    .   Acute ITP ([place])   07/09/2016    .   Persistent headaches   07/02/2016    .   Mass of upper inner quadrant of left breast   06/20/2016    .   Carcinoma of overlapping sites of right breast in female, estrogen receptor positive ([place])   06/08/2016    .   Fatigue   06/08/2016    .   Chronic fatigue   06/08/2016    .   Nutritional anemia, unspecified   06/08/2016    .   Prediabetes   01/25/2016    .   Tobacco abuse   11/06/2012    .   History  of breast cancer   11/06/2012    .   Osteopenia   11/08/2011    .   Hyperlipidemia   11/02/2010    .   Essential hypertension   12/23/2006    .   GERD   12/23/2006                Past Surgical History:    Procedure   Laterality   Date    .   ABDOMINAL HYSTERECTOMY            .   BREAST BIOPSY   Left   2002        left breast, calcifications    .   BREAST BIOPSY   Left   06/20/2016        INVASIVE DUCTAL CARCINOMA.    Marland Kitchen   BREAST EXCISIONAL BIOPSY   Right   2007        positive    .   bunion repair            .   ESOPHAGOGASTRODUODENOSCOPY (EGD) WITH PROPOFOL   N/A   08/05/2016        Procedure: ESOPHAGOGASTRODUODENOSCOPY (EGD) WITH PROPOFOL;  Surgeon: San Jetty, MD;  Location: [facility] ENDOSCOPY;  Service: General;  Laterality: N/A;    .   JOINT REPLACEMENT            .   left breast biopsy            .  NASAL SINUS SURGERY            .   right hip replacement            .   SHOULDER SURGERY            .   SQUAMOUS CELL CARCINOMA EXCISION                right leg, Dr. Stewart    .   TOTAL HIP ARTHROPLASTY                right                  Prior to Admission medications     Medication   Sig   Start Date   End Date   Taking?   Authorizing Provider    ALPRAZolam (XANAX) 0.5 MG tablet   TAKE 1 TABLET BY MOUTH EVERY 8 HOURS AS NEEDED FOR ANXIETY OR SLEEP  Patient taking differently: take 1 tablet by mouth at bedtime   02/28/17           Allen, Lauren G, NP    cyanocobalamin (,VITAMIN B-12,) 1000 MCG/ML injection   Inject 1,000 mcg into the muscle every 30 (thirty) days.               [provider]    ergocalciferol (VITAMIN D2) 50000 units capsule   Take 1 capsule (50,000 Units total) by mouth once a week.  Patient taking differently: Take 50,000 Units by mouth  every Saturday. In the morning.   12/17/16           Brahmanday, Govinda R, MD    fexofenadine (ALLEGRA) 180 MG tablet   Take 180 mg by mouth every morning.                [provider]    Glucosamine-Chondroitin (COSAMIN DS PO)   Take 1 tablet by mouth daily. In the morning.               [provider]    letrozole (FEMARA) 2.5 MG tablet   Take 1 tablet (2.5 mg total) by mouth daily. Once a day.   07/02/16           Brahmanday, Govinda R, MD    metoprolol succinate (TOPROL-XL) 25 MG 24 hr tablet   Take 1 tablet (25 mg total) by mouth daily.  Patient taking differently: Take 25 mg by mouth every morning.    05/11/16           Cook, Jayce G, DO    neomycin-polymyxin-pramoxine (NEOSPORIN PLUS) 1 % cream   Apply 1 application topically 2 (two) times daily as needed (Nasal passages as needed for dryness.).               [provider]    pravastatin (PRAVACHOL) 40 MG tablet   Take 1 tablet (40 mg total) by mouth daily.  Patient taking differently: Take 40 mg by mouth at bedtime.    05/11/16           Cook, Jayce G, DO    Probiotic Product (PROBIOTIC & ACIDOPHILUS EX ST PO)   Take 1 tablet by mouth daily.                [provider]    ranitidine (ZANTAC) 75 MG tablet   Take 75 mg by mouth as needed.                  [provider]    vitamin B-12 (CYANOCOBALAMIN) 1000 MCG tablet   Take 1,000 mcg by mouth daily.                [provider]                Allergies    Allergen   Reactions    .   Codeine   Anaphylaxis    .   Fish-Derived Products   Anaphylaxis    .   Morphine Sulfate   Anaphylaxis            REACTION: anaphylaxis    .   Adhesive [Tape]                PAPER TAPE OK TO USE    .   Oxycodone                Nausea and vomiting                Family  History    Problem   Relation   Age of Onset    .   Heart disease   Father   59    .   Heart disease   Mother        .   Hyperlipidemia   Mother        .   Heart disease   Brother        .   Diabetes   Brother        .   Diabetes   Maternal Grandmother        .   Breast cancer   Cousin        .   Kidney disease   Brother              Social History   Social History             Tobacco Use    .   Smoking status:   Current Some Day Smoker            Packs/day:   0.10            Years:   30.00            Pack years:   3.00            Types:   Cigarettes    .   Smokeless tobacco:   Never Used    Substance Use Topics    .   Alcohol use:   Yes            Alcohol/week:   0.0 oz            Comment: socially    .   Drug use:   No          Review of Systems  Constitutional: Negative for fever.  Eyes: Negative for visual complaints  ENT: Positive for nosebleed times 3 hours  Cardiovascular: Negative for chest pain.  Respiratory: Negative for shortness of breath.  Gastrointestinal: Negative for abdominal pain, vomiting or nausea.  Genitourinary: Negative for urinary compaints  Musculoskeletal: Negative for musculoskeletal complaints  Skin: Negative for skin complaints   Neurological: Negative for headache  All other ROS negative     ____________________________________________        PHYSICAL EXAM:     VITAL SIGNS:       ED Triage Vitals    Enc Vitals Group  BP   04/11/17 0817   (!) 172/70       Pulse Rate   04/11/17 0817   (!) 117       Resp   --           Temp   --           Temp src   --           SpO2   --           Weight   04/11/17 0818   140 lb (63.5 kg)       Height   --           Head Circumference   --           Peak Flow    --           Pain Score   04/11/17 0817   0       Pain Loc   --           Pain Edu?   --           Excl. in [place]?   --           Constitutional: Alert and oriented. Well appearing and in no distress.  Eyes: Normal exam  ENT       Head: Normocephalic and atraumatic.  Patient has blood coming from the left nostril.  Small amount of blood going down the back of her throat.  With nasal speculum I am unable to identify the source of the bleed but she continues to have a mild bleed.  Does not appear to be any blood in the right nostril.       Mouth/Throat: Mucous membranes are moist.  Cardiovascular: Normal rate, regular rhythm. No murmur  Respiratory: Normal respiratory effort without tachypnea nor retractions. Breath sounds are clear  Gastrointestinal: Soft and nontender. No distention.   Musculoskeletal: Nontender with normal range of motion in all extremities.  Neurologic:  Normal speech and language. No gross focal neurologic deficits   Skin:  Skin is warm, dry and intact.   Psychiatric: Mood and affect are normal.      ____________________________________________        INITIAL IMPRESSION / ASSESSMENT AND PLAN / ED COURSE     Pertinent labs & imaging results that were available during my care of the patient were reviewed by me and considered in my medical decision making (see chart for details).     Patient presents to the emergency department with a nosebleed from the left nostril.  History of the same in the past but this 1 will not stop on its own.  Will attempt Afrin administration and closely monitor in the emergency department.  Mild bleed currently.  Patient has a significant history of thrombocytopenia last count was 16,000.  Is scheduled for platelet transfusion tomorrow.  We will discussed with hematology/oncology, will repeat lab work today and continue to closely monitor.        Patient has remained hemostatic after Afrin  use for greater than 1 hour at this time.  Patient's labs have resulted showing a platelet count of 10 and hemoglobin of 8.  Hemoglobin is largely unchanged, platelet count continues to trend down.  I discussed the patient with hematology/oncology Dr. Rogue Bussing.  States the patient requires a special type of platelet for transfusion, he is currently attempting to see if it would be possible to transfuse her in the office  today.  He will call back and let me know.  Currently the patient appears well, no distress.  No further bleeding times 1 hour after Afrin use.     EKG obtained for preop purposes as the patient states she was supposed to have an EKG obtained today regardless.  Reviewed and interpreted by myself shows normal sinus rhythm at 90 bpm with a narrow QRS, normal axis, normal intervals, nonspecific but no concerning ST changes.     I discussed the patient with Dr. Burlene Arnt.  Patient has a platelet count of 10,000 and he believes the patient would benefit from platelet transfusion today.  Due to the patient's antibiotics, etc. she will need special platelet testing to be sent off to the [facility].  I discussed this with Dr. Burlene Arnt as well as the blood bank.  They state send to large Lavender tubes and to red top tubes.  Blood bank states they will send [facility] the platelets.  The platelets will then be infused this afternoon in the infusion center whenever they become available.  Patient is to call the hematologist around 1 PM for a better timeline.  Patient agreeable to this plan of care.  Blood bank is aware that the platelets will be transfused in the oncology office/transfusion center.  Patient remains hemostatic.  We will discharge from the emergency department after blood is been collected.     ____________________________________________        FINAL CLINICAL IMPRESSION(S) / ED DIAGNOSES     Epistaxis  Thrombocytopenia     Harvest Dark,  MD  04/11/17 1122              Electronically signed by Harvest Dark, MD at 04/11/2017 11:22 AM               ED on 04/11/2017                  Detailed Report

## 2017-04-11 NOTE — ED Notes (Signed)
Nose has stopped bleeding at this time.

## 2017-04-12 ENCOUNTER — Inpatient Hospital Stay: Payer: Medicare Other | Attending: Internal Medicine

## 2017-04-12 ENCOUNTER — Other Ambulatory Visit: Payer: Self-pay | Admitting: *Deleted

## 2017-04-12 ENCOUNTER — Telehealth: Payer: Self-pay | Admitting: General Surgery

## 2017-04-12 DIAGNOSIS — Z923 Personal history of irradiation: Secondary | ICD-10-CM | POA: Insufficient documentation

## 2017-04-12 DIAGNOSIS — Z90710 Acquired absence of both cervix and uterus: Secondary | ICD-10-CM | POA: Insufficient documentation

## 2017-04-12 DIAGNOSIS — C50811 Malignant neoplasm of overlapping sites of right female breast: Secondary | ICD-10-CM | POA: Diagnosis not present

## 2017-04-12 DIAGNOSIS — D696 Thrombocytopenia, unspecified: Secondary | ICD-10-CM

## 2017-04-12 DIAGNOSIS — Z803 Family history of malignant neoplasm of breast: Secondary | ICD-10-CM | POA: Insufficient documentation

## 2017-04-12 DIAGNOSIS — D469 Myelodysplastic syndrome, unspecified: Secondary | ICD-10-CM | POA: Insufficient documentation

## 2017-04-12 DIAGNOSIS — R0602 Shortness of breath: Secondary | ICD-10-CM | POA: Insufficient documentation

## 2017-04-12 DIAGNOSIS — Z170 Estrogen receptor positive status [ER+]: Principal | ICD-10-CM

## 2017-04-12 DIAGNOSIS — M810 Age-related osteoporosis without current pathological fracture: Secondary | ICD-10-CM | POA: Insufficient documentation

## 2017-04-12 DIAGNOSIS — Z85828 Personal history of other malignant neoplasm of skin: Secondary | ICD-10-CM | POA: Insufficient documentation

## 2017-04-12 DIAGNOSIS — Z79899 Other long term (current) drug therapy: Secondary | ICD-10-CM | POA: Diagnosis not present

## 2017-04-12 DIAGNOSIS — E785 Hyperlipidemia, unspecified: Secondary | ICD-10-CM | POA: Diagnosis not present

## 2017-04-12 DIAGNOSIS — Z8614 Personal history of Methicillin resistant Staphylococcus aureus infection: Secondary | ICD-10-CM | POA: Diagnosis not present

## 2017-04-12 DIAGNOSIS — M1990 Unspecified osteoarthritis, unspecified site: Secondary | ICD-10-CM | POA: Insufficient documentation

## 2017-04-12 DIAGNOSIS — F17210 Nicotine dependence, cigarettes, uncomplicated: Secondary | ICD-10-CM | POA: Diagnosis not present

## 2017-04-12 DIAGNOSIS — J449 Chronic obstructive pulmonary disease, unspecified: Secondary | ICD-10-CM | POA: Diagnosis not present

## 2017-04-12 DIAGNOSIS — Z79811 Long term (current) use of aromatase inhibitors: Secondary | ICD-10-CM | POA: Diagnosis not present

## 2017-04-12 DIAGNOSIS — I10 Essential (primary) hypertension: Secondary | ICD-10-CM | POA: Diagnosis not present

## 2017-04-12 DIAGNOSIS — D61818 Other pancytopenia: Secondary | ICD-10-CM | POA: Insufficient documentation

## 2017-04-12 DIAGNOSIS — K219 Gastro-esophageal reflux disease without esophagitis: Secondary | ICD-10-CM | POA: Insufficient documentation

## 2017-04-12 MED ORDER — SODIUM CHLORIDE 0.9 % IV SOLN
250.0000 mL | Freq: Once | INTRAVENOUS | Status: AC
  Administered 2017-04-12: 250 mL via INTRAVENOUS
  Filled 2017-04-12: qty 250

## 2017-04-12 MED ORDER — ACETAMINOPHEN 325 MG PO TABS
ORAL_TABLET | ORAL | Status: AC
  Filled 2017-04-12: qty 2

## 2017-04-12 MED ORDER — CEFAZOLIN SODIUM-DEXTROSE 2-4 GM/100ML-% IV SOLN
2.0000 g | INTRAVENOUS | Status: AC
  Administered 2017-04-13: 2 g via INTRAVENOUS

## 2017-04-12 MED ORDER — DIPHENHYDRAMINE HCL 25 MG PO CAPS
25.0000 mg | ORAL_CAPSULE | Freq: Once | ORAL | Status: AC
  Administered 2017-04-12: 25 mg via ORAL

## 2017-04-12 MED ORDER — DIPHENHYDRAMINE HCL 25 MG PO CAPS
ORAL_CAPSULE | ORAL | Status: AC
  Filled 2017-04-12: qty 1

## 2017-04-12 MED ORDER — ACETAMINOPHEN 325 MG PO TABS
650.0000 mg | ORAL_TABLET | Freq: Once | ORAL | Status: AC
  Administered 2017-04-12: 650 mg via ORAL

## 2017-04-12 MED ORDER — METHYLPREDNISOLONE SODIUM SUCC 125 MG IJ SOLR
40.0000 mg | Freq: Once | INTRAMUSCULAR | Status: AC
  Administered 2017-04-12: 40 mg via INTRAVENOUS

## 2017-04-12 MED ORDER — METHYLPREDNISOLONE SODIUM SUCC 125 MG IJ SOLR
INTRAMUSCULAR | Status: AC
  Filled 2017-04-12: qty 2

## 2017-04-12 NOTE — Telephone Encounter (Signed)
The patient has been instructed that she should have nothing to eat after midnight, clear liquids only until 6 AM.  She is to report to the surgery desk at [facility] at 845 on Saturday, February 2.  We will plan to put her port and at that time.  Patient will have her platelet transfusion this evening through the cancer center.

## 2017-04-12 NOTE — Telephone Encounter (Signed)
I spoke with Dr. Rogue Bussing. Dr. Bary Castilla may be able to get the port scheduled tomorrow. If this is the case, the infusion nurse can saline lock patient's iv access per Dr. Jacinto Reap.  I spoke with Katharine Look, RN in infusion to provide this hand off.

## 2017-04-13 ENCOUNTER — Ambulatory Visit: Payer: Medicare Other

## 2017-04-13 ENCOUNTER — Ambulatory Visit
Admission: RE | Admit: 2017-04-13 | Discharge: 2017-04-13 | Disposition: A | Discharge status: Home / Self Care | Payer: Medicare Other | Source: Ambulatory Visit | Attending: General Surgery | Admitting: General Surgery

## 2017-04-13 ENCOUNTER — Ambulatory Visit: Payer: Medicare Other | Admitting: Anesthesiology

## 2017-04-13 ENCOUNTER — Encounter: Payer: Self-pay | Admitting: *Deleted

## 2017-04-13 ENCOUNTER — Encounter
Admission: RE | Disposition: A | Discharge status: Home / Self Care | Payer: Self-pay | Source: Ambulatory Visit | Attending: General Surgery

## 2017-04-13 DIAGNOSIS — Z853 Personal history of malignant neoplasm of breast: Secondary | ICD-10-CM | POA: Diagnosis not present

## 2017-04-13 DIAGNOSIS — Z85118 Personal history of other malignant neoplasm of bronchus and lung: Secondary | ICD-10-CM | POA: Diagnosis not present

## 2017-04-13 DIAGNOSIS — Z923 Personal history of irradiation: Secondary | ICD-10-CM | POA: Diagnosis not present

## 2017-04-13 DIAGNOSIS — J449 Chronic obstructive pulmonary disease, unspecified: Secondary | ICD-10-CM | POA: Insufficient documentation

## 2017-04-13 DIAGNOSIS — M1990 Unspecified osteoarthritis, unspecified site: Secondary | ICD-10-CM | POA: Insufficient documentation

## 2017-04-13 DIAGNOSIS — D696 Thrombocytopenia, unspecified: Secondary | ICD-10-CM | POA: Insufficient documentation

## 2017-04-13 DIAGNOSIS — E785 Hyperlipidemia, unspecified: Secondary | ICD-10-CM | POA: Insufficient documentation

## 2017-04-13 DIAGNOSIS — Z85828 Personal history of other malignant neoplasm of skin: Secondary | ICD-10-CM | POA: Diagnosis not present

## 2017-04-13 DIAGNOSIS — Z79899 Other long term (current) drug therapy: Secondary | ICD-10-CM | POA: Insufficient documentation

## 2017-04-13 DIAGNOSIS — Z452 Encounter for adjustment and management of vascular access device: Secondary | ICD-10-CM | POA: Diagnosis not present

## 2017-04-13 DIAGNOSIS — I10 Essential (primary) hypertension: Secondary | ICD-10-CM | POA: Diagnosis not present

## 2017-04-13 DIAGNOSIS — Z8505 Personal history of malignant neoplasm of liver: Secondary | ICD-10-CM | POA: Diagnosis not present

## 2017-04-13 DIAGNOSIS — M810 Age-related osteoporosis without current pathological fracture: Secondary | ICD-10-CM | POA: Diagnosis not present

## 2017-04-13 DIAGNOSIS — Z9221 Personal history of antineoplastic chemotherapy: Secondary | ICD-10-CM | POA: Insufficient documentation

## 2017-04-13 DIAGNOSIS — K219 Gastro-esophageal reflux disease without esophagitis: Secondary | ICD-10-CM | POA: Insufficient documentation

## 2017-04-13 DIAGNOSIS — Z95828 Presence of other vascular implants and grafts: Secondary | ICD-10-CM

## 2017-04-13 DIAGNOSIS — Z8614 Personal history of Methicillin resistant Staphylococcus aureus infection: Secondary | ICD-10-CM | POA: Insufficient documentation

## 2017-04-13 HISTORY — DX: Malignant neoplasm of unspecified part of unspecified bronchus or lung: C34.90

## 2017-04-13 HISTORY — PX: PORTACATH PLACEMENT: SHX2246

## 2017-04-13 HISTORY — DX: Malignant neoplasm of liver, not specified as primary or secondary: C22.9

## 2017-04-13 LAB — PREPARE PLATELET PHERESIS: Unit division: 0

## 2017-04-13 LAB — BPAM PLATELET PHERESIS
Blood Product Expiration Date: 201902011520
ISSUE DATE / TIME: 201902011459
Unit Type and Rh: 5100

## 2017-04-13 LAB — PLATELET COUNT: PLATELETS: 38 10*3/uL — AB (ref 150–440)

## 2017-04-13 SURGERY — INSERTION, TUNNELED CENTRAL VENOUS DEVICE, WITH PORT
Anesthesia: General | Laterality: Right | Wound class: Clean

## 2017-04-13 MED ORDER — SODIUM CHLORIDE 0.9 % IJ SOLN
INTRAMUSCULAR | Status: AC
  Filled 2017-04-13: qty 50

## 2017-04-13 MED ORDER — PROPOFOL 500 MG/50ML IV EMUL
INTRAVENOUS | Status: AC
  Filled 2017-04-13: qty 50

## 2017-04-13 MED ORDER — PROPOFOL 500 MG/50ML IV EMUL
INTRAVENOUS | Status: DC | PRN
  Administered 2017-04-13: 120 ug/kg/min via INTRAVENOUS

## 2017-04-13 MED ORDER — ONDANSETRON HCL 4 MG/2ML IJ SOLN
INTRAMUSCULAR | Status: DC | PRN
  Administered 2017-04-13: 4 mg via INTRAVENOUS

## 2017-04-13 MED ORDER — PROPOFOL 10 MG/ML IV BOLUS
INTRAVENOUS | Status: DC | PRN
  Administered 2017-04-13: 40 mg via INTRAVENOUS

## 2017-04-13 MED ORDER — PHENYLEPHRINE HCL 10 MG/ML IJ SOLN
INTRAMUSCULAR | Status: DC | PRN
  Administered 2017-04-13: 100 ug via INTRAVENOUS

## 2017-04-13 MED ORDER — MIDAZOLAM HCL 2 MG/2ML IJ SOLN
INTRAMUSCULAR | Status: DC | PRN
  Administered 2017-04-13: 1 mg via INTRAVENOUS

## 2017-04-13 MED ORDER — LIDOCAINE HCL (PF) 2 % IJ SOLN
INTRAMUSCULAR | Status: AC
  Filled 2017-04-13: qty 10

## 2017-04-13 MED ORDER — LIDOCAINE-EPINEPHRINE 0.5 %-1:200000 IJ SOLN
INTRAMUSCULAR | Status: AC
  Filled 2017-04-13: qty 1

## 2017-04-13 MED ORDER — SODIUM CHLORIDE 0.9 % IJ SOLN
INTRAMUSCULAR | Status: DC | PRN
  Administered 2017-04-13: 8 mL

## 2017-04-13 MED ORDER — FENTANYL CITRATE (PF) 100 MCG/2ML IJ SOLN
INTRAMUSCULAR | Status: AC
  Filled 2017-04-13: qty 2

## 2017-04-13 MED ORDER — LIDOCAINE-EPINEPHRINE (PF) 1 %-1:200000 IJ SOLN
INTRAMUSCULAR | Status: DC | PRN
  Administered 2017-04-13: 10 mL

## 2017-04-13 MED ORDER — FENTANYL CITRATE (PF) 100 MCG/2ML IJ SOLN
INTRAMUSCULAR | Status: DC | PRN
  Administered 2017-04-13 (×2): 25 ug via INTRAVENOUS

## 2017-04-13 MED ORDER — LACTATED RINGERS IV SOLN
INTRAVENOUS | Status: DC | PRN
  Administered 2017-04-13: 10:00:00 via INTRAVENOUS

## 2017-04-13 MED ORDER — LIDOCAINE-EPINEPHRINE (PF) 1 %-1:200000 IJ SOLN
INTRAMUSCULAR | Status: AC
  Filled 2017-04-13: qty 30

## 2017-04-13 MED ORDER — LACTATED RINGERS IV SOLN: INTRAVENOUS | Status: DC

## 2017-04-13 MED ORDER — MIDAZOLAM HCL 2 MG/2ML IJ SOLN
INTRAMUSCULAR | Status: AC
  Filled 2017-04-13: qty 2

## 2017-04-13 SURGICAL SUPPLY — 31 items
BLADE SURG 15 STRL SS SAFETY (BLADE) ×3 IMPLANT
CHLORAPREP W/TINT 26ML (MISCELLANEOUS) ×3 IMPLANT
CLOSURE WOUND 1/2 X4 (GAUZE/BANDAGES/DRESSINGS) ×1
COVER LIGHT HANDLE STERIS (MISCELLANEOUS) ×6 IMPLANT
DECANTER SPIKE VIAL GLASS SM (MISCELLANEOUS) ×6 IMPLANT
DRAPE C-ARM XRAY 36X54 (DRAPES) ×3 IMPLANT
DRAPE LAPAROTOMY TRNSV 106X77 (MISCELLANEOUS) ×3 IMPLANT
DRSG TEGADERM 2-3/8X2-3/4 SM (GAUZE/BANDAGES/DRESSINGS) ×3 IMPLANT
DRSG TEGADERM 4X4.75 (GAUZE/BANDAGES/DRESSINGS) ×3 IMPLANT
DRSG TELFA 4X3 1S NADH ST (GAUZE/BANDAGES/DRESSINGS) ×3 IMPLANT
ELECT REM PT RETURN 9FT ADLT (ELECTROSURGICAL) ×3
ELECTRODE REM PT RTRN 9FT ADLT (ELECTROSURGICAL) ×1 IMPLANT
GLOVE BIO SURGEON STRL SZ7.5 (GLOVE) ×3 IMPLANT
GLOVE INDICATOR 8.0 STRL GRN (GLOVE) ×3 IMPLANT
GOWN STRL REUS W/ TWL LRG LVL3 (GOWN DISPOSABLE) ×2 IMPLANT
GOWN STRL REUS W/TWL LRG LVL3 (GOWN DISPOSABLE) ×4
KIT PORT POWER 8FR ISP CVUE (Miscellaneous) ×3 IMPLANT
KIT TURNOVER KIT A (KITS) ×3 IMPLANT
LABEL OR SOLS (LABEL) ×3 IMPLANT
MICROPUNCTURE 5FR NT-U-SST (MISCELLANEOUS) ×3
NS IRRIG 500ML POUR BTL (IV SOLUTION) ×3 IMPLANT
PACK PORT-A-CATH (MISCELLANEOUS) ×3 IMPLANT
SET MICROPUNCTURE 5FR NT-U-SST (MISCELLANEOUS) ×1 IMPLANT
STRIP CLOSURE SKIN 1/2X4 (GAUZE/BANDAGES/DRESSINGS) ×2 IMPLANT
SUT PROLENE 3 0 SH DA (SUTURE) ×3 IMPLANT
SUT VIC AB 3-0 SH 27 (SUTURE) ×2
SUT VIC AB 3-0 SH 27X BRD (SUTURE) ×1 IMPLANT
SUT VIC AB 4-0 FS2 27 (SUTURE) ×3 IMPLANT
SWABSTK COMLB BENZOIN TINCTURE (MISCELLANEOUS) ×3 IMPLANT
SYR 10ML LL (SYRINGE) ×3 IMPLANT
SYR 10ML SLIP (SYRINGE) ×3 IMPLANT

## 2017-04-13 NOTE — Anesthesia Preprocedure Evaluation (Signed)
Anesthesia Evaluation  Patient identified by MRN, date of birth, ID band Patient awake    Reviewed: Allergy & Precautions, NPO status , Patient's Chart, lab work & pertinent test results, reviewed documented beta blocker date and time   History of Anesthesia Complications (+) history of anesthetic complications  Airway Mallampati: I  TM Distance: >3 FB     Dental  (+) Chipped, Caps, Dental Advidsory Given   Pulmonary neg shortness of breath, COPD, neg recent URI, Current Smoker,           Cardiovascular hypertension, Pt. on medications and Pt. on home beta blockers (-) angina(-) CAD, (-) Past MI, (-) Cardiac Stents and (-) CABG (-) dysrhythmias (-) Valvular Problems/Murmurs     Neuro/Psych  Headaches, neg Seizures    GI/Hepatic Neg liver ROS, GERD  ,  Endo/Other  negative endocrine ROS  Renal/GU negative Renal ROS     Musculoskeletal  (+) Arthritis ,   Abdominal   Peds  Hematology  (+) Blood dyscrasia, anemia ,   Anesthesia Other Findings Past Medical History: No date: Blood dyscrasia No date: Breast cancer ([place])     Comment:  right, lumpectomy, radiation, chemo No date: Breast cancer ([place])     Comment:  Right, 2007 06/20/2016: Breast cancer ([place])     Comment:  INVASIVE DUCTAL CARCINOMA. 02/14/2017: Collapsed lung     Comment:  RIGHT No date: Complication of anesthesia     Comment:  PT STATED AFTER BIL HIP SURGERIES SHE STOPPED BREATHING               THE NIGHT AFTER SURGERY No date: COPD (chronic obstructive pulmonary disease) (HCC) No date: GERD (gastroesophageal reflux disease) No date: Headache No date: History of chemotherapy 2011: History of methicillin resistant staphylococcus aureus (MRSA) No date: History of radiation therapy No date: Hyperlipidemia No date: Hypertension 05/2016: Liver cancer ([place]) 05/2016: Lung cancer ([place]) No date: Osteoarthritis     Comment:  right hip No date:  Osteoporosis No date: Squamous cell carcinoma     Comment:  leg, Followed by Dr. Nicole Kindred   Reproductive/Obstetrics negative OB ROS                             Anesthesia Physical  Anesthesia Plan  ASA: III  Anesthesia Plan: General   Post-op Pain Management:    Induction: Intravenous  PONV Risk Score and Plan: 1 and Propofol infusion  Airway Management Planned:   Additional Equipment:   Intra-op Plan:   Post-operative Plan:   Informed Consent: I have reviewed the patients History and Physical, chart, labs and discussed the procedure including the risks, benefits and alternatives for the proposed anesthesia with the patient or authorized representative who has indicated his/her understanding and acceptance.     Plan Discussed with: CRNA  Anesthesia Plan Comments:         Anesthesia Quick Evaluation

## 2017-04-13 NOTE — Transfer of Care (Signed)
Immediate Anesthesia Transfer of Care Note  Patient: Shelly Arias  Procedure(s) Performed: INSERTION PORT-A-CATH- RIGHT INTERNAL JUGULAR (Right )  Patient Location: PACU  Anesthesia Type:General  Level of Consciousness: awake, alert  and oriented  Airway & Oxygen Therapy: Patient Spontanous Breathing  Post-op Assessment: Report given to RN and Post -op Vital signs reviewed and stable  Post vital signs: Reviewed and stable  Last Vitals:  Vitals:   04/13/17 0904 04/13/17 1114  BP: 137/65 (!) 118/51  Pulse: 96   Resp: 18 20  Temp: (!) 36.2 C 36.8 C  SpO2: 100% 100%    Last Pain:  Vitals:   04/13/17 1114  TempSrc: Temporal         Complications: No apparent anesthesia complications

## 2017-04-13 NOTE — Discharge Instructions (Addendum)
Implanted Port Insertion, Care After °This sheet gives you information about how to care for yourself after your procedure. Your health care provider may also give you more specific instructions. If you have problems or questions, contact your health care provider. °What can I expect after the procedure? °After your procedure, it is common to have: °· Discomfort at the port insertion site. °· Bruising on the skin over the port. This should improve over 3-4 days. ° °Follow these instructions at home: °Port care °· After your port is placed, you will get a manufacturer's information card. The card has information about your port. Keep this card with you at all times. °· Take care of the port as told by your health care provider. Ask your health care provider if you or a family member can get training for taking care of the port at home. A home health care nurse may also take care of the port. °· Make sure to remember what type of port you have. °Incision care °· Follow instructions from your health care provider about how to take care of your port insertion site. Make sure you: °? Wash your hands with soap and water before you change your bandage (dressing). If soap and water are not available, use hand sanitizer. °? Change your dressing as told by your health care provider. °? Leave stitches (sutures), skin glue, or adhesive strips in place. These skin closures may need to stay in place for 2 weeks or longer. If adhesive strip edges start to loosen and curl up, you may trim the loose edges. Do not remove adhesive strips completely unless your health care provider tells you to do that. °· Check your port insertion site every day for signs of infection. Check for: °? More redness, swelling, or pain. °? More fluid or blood. °? Warmth. °? Pus or a bad smell. °General instructions °· Do not take baths, swim, or use a hot tub until your health care provider approves. °· Do not lift anything that is heavier than 10 lb (4.5  kg) for a week, or as told by your health care provider. °· Ask your health care provider when it is okay to: °? Return to work or school. °? Resume usual physical activities or sports. °· Do not drive for 24 hours if you were given a medicine to help you relax (sedative). °· Take over-the-counter and prescription medicines only as told by your health care provider. °· Wear a medical alert bracelet in case of an emergency. This will tell any health care providers that you have a port. °· Keep all follow-up visits as told by your health care provider. This is important. °Contact a health care provider if: °· You cannot flush your port with saline as directed, or you cannot draw blood from the port. °· You have a fever or chills. °· You have more redness, swelling, or pain around your port insertion site. °· You have more fluid or blood coming from your port insertion site. °· Your port insertion site feels warm to the touch. °· You have pus or a bad smell coming from the port insertion site. °Get help right away if: °· You have chest pain or shortness of breath. °· You have bleeding from your port that you cannot control. °Summary °· Take care of the port as told by your health care provider. °· Change your dressing as told by your health care provider. °· Keep all follow-up visits as told by your health care provider. °  This information is not intended to replace advice given to you by your health care provider. Make sure you discuss any questions you have with your health care provider. °Document Released: 12/17/2012 Document Revised: 01/18/2016 Document Reviewed: 01/18/2016 °Elsevier Interactive Patient Education © 2017 [employer] ° °

## 2017-04-13 NOTE — Anesthesia Postprocedure Evaluation (Signed)
Anesthesia Post Note  Patient: Shelly Arias  Procedure(s) Performed: INSERTION PORT-A-CATH- RIGHT INTERNAL JUGULAR (Right )  Patient location during evaluation: PACU Anesthesia Type: General Level of consciousness: awake and alert Pain management: pain level controlled Vital Signs Assessment: post-procedure vital signs reviewed and stable Respiratory status: spontaneous breathing, nonlabored ventilation, respiratory function stable and patient connected to nasal cannula oxygen Cardiovascular status: blood pressure returned to baseline and stable Postop Assessment: no apparent nausea or vomiting Anesthetic complications: no     Last Vitals:  Vitals:   04/13/17 1144 04/13/17 1200  BP: 133/60 (!) 120/55  Pulse: 96 81  Resp: 16 (!) 23  Temp:  36.7 C  SpO2: 99% 100%    Last Pain:  Vitals:   04/13/17 1114  TempSrc: Temporal                 Martha Clan

## 2017-04-13 NOTE — Anesthesia Post-op Follow-up Note (Signed)
Anesthesia QCDR form completed.        

## 2017-04-13 NOTE — Op Note (Signed)
Preoperative diagnosis: Thrombocytopenia, need for central venous access.  Postoperative diagnosis: Same.  Operative procedure: Right internal jugular PowerPort placement with ultrasound and fluoroscopic guidance.  Operating Surgeon: Hervey Ard, MD.  Anesthesia: IV sedation, 10 cc of 1% Xylocaine with 1-200,000 units of epinephrine.  Estimated blood loss: 5 cc.  Clinical note: This 72 year old woman has developed severe thrombocytopenia requiring frequent transfusions.  Central venous access was requested by her treating oncologist.  She received Kefzol prior to the procedure.  The patient was transfused platelets yesterday.  This morning's platelet count is 38,000.  Operative note: The patient was brought to the operating room and placed supine on the table.  In general Trendelenburg position and with the neck rolled to the left of the right neck chest and axilla was cleansed with ChloraPrep and draped.  Ultrasound was used to confirm patency of the internal jugular vein.  A micro puncture technique was utilized followed by a guidewire and 5 [place] dilator.  The primary guidewire followed by a 7 [place] dilator was placed followed by the port catheter.  Scant bleeding was noted.  The catheter was tunneled to a pocket on the right anterior chest.  The port easily irrigated and aspirated.  It was tacked to the deep tissue with interrupted 3-0 Prolene sutures x2.  The soft tissue was approximated with a running 3-0 Vicryl suture.  The skin was closed with a running 4-0 Vicryl subarticular suture.  Benzoin, Steri-Strips followed by Telfa and Tegaderm dressings were applied.  The patient was taken to the recovery room in stable condition.  Erect chest x-ray showed the catheter tip as described above and no evidence of pneumothorax.

## 2017-04-13 NOTE — H&P (Signed)
Patient with bone marrow failure requiring multiple transfusions,especially platelets. Central access requested for ease of care. Procedure reviewed with patient.  Awaiting today's platelet count (transfused late yesterday afternoon) prior to procedure. Health status unchanged.

## 2017-04-15 ENCOUNTER — Other Ambulatory Visit: Payer: Self-pay | Admitting: *Deleted

## 2017-04-15 ENCOUNTER — Inpatient Hospital Stay: Payer: Medicare Other

## 2017-04-15 ENCOUNTER — Telehealth: Payer: Self-pay | Admitting: Internal Medicine

## 2017-04-15 ENCOUNTER — Encounter: Payer: Self-pay | Admitting: General Surgery

## 2017-04-15 ENCOUNTER — Inpatient Hospital Stay (HOSPITAL_BASED_OUTPATIENT_CLINIC_OR_DEPARTMENT_OTHER): Payer: Medicare Other | Admitting: Internal Medicine

## 2017-04-15 VITALS — BP 115/79 | HR 91 | Temp 97.8°F | Resp 16 | Wt 139.0 lb

## 2017-04-15 DIAGNOSIS — M810 Age-related osteoporosis without current pathological fracture: Secondary | ICD-10-CM

## 2017-04-15 DIAGNOSIS — F17210 Nicotine dependence, cigarettes, uncomplicated: Secondary | ICD-10-CM

## 2017-04-15 DIAGNOSIS — E785 Hyperlipidemia, unspecified: Secondary | ICD-10-CM | POA: Diagnosis not present

## 2017-04-15 DIAGNOSIS — Z90710 Acquired absence of both cervix and uterus: Secondary | ICD-10-CM

## 2017-04-15 DIAGNOSIS — Z170 Estrogen receptor positive status [ER+]: Secondary | ICD-10-CM | POA: Diagnosis not present

## 2017-04-15 DIAGNOSIS — Z803 Family history of malignant neoplasm of breast: Secondary | ICD-10-CM

## 2017-04-15 DIAGNOSIS — Z85828 Personal history of other malignant neoplasm of skin: Secondary | ICD-10-CM

## 2017-04-15 DIAGNOSIS — C50811 Malignant neoplasm of overlapping sites of right female breast: Secondary | ICD-10-CM

## 2017-04-15 DIAGNOSIS — K219 Gastro-esophageal reflux disease without esophagitis: Secondary | ICD-10-CM | POA: Diagnosis not present

## 2017-04-15 DIAGNOSIS — R0602 Shortness of breath: Secondary | ICD-10-CM | POA: Diagnosis not present

## 2017-04-15 DIAGNOSIS — D61818 Other pancytopenia: Secondary | ICD-10-CM

## 2017-04-15 DIAGNOSIS — Z923 Personal history of irradiation: Secondary | ICD-10-CM

## 2017-04-15 DIAGNOSIS — D469 Myelodysplastic syndrome, unspecified: Secondary | ICD-10-CM

## 2017-04-15 DIAGNOSIS — D649 Anemia, unspecified: Secondary | ICD-10-CM

## 2017-04-15 DIAGNOSIS — Z79899 Other long term (current) drug therapy: Secondary | ICD-10-CM | POA: Diagnosis not present

## 2017-04-15 DIAGNOSIS — Z8614 Personal history of Methicillin resistant Staphylococcus aureus infection: Secondary | ICD-10-CM

## 2017-04-15 DIAGNOSIS — Z79811 Long term (current) use of aromatase inhibitors: Secondary | ICD-10-CM | POA: Diagnosis not present

## 2017-04-15 DIAGNOSIS — J449 Chronic obstructive pulmonary disease, unspecified: Secondary | ICD-10-CM | POA: Diagnosis not present

## 2017-04-15 DIAGNOSIS — M1990 Unspecified osteoarthritis, unspecified site: Secondary | ICD-10-CM | POA: Diagnosis not present

## 2017-04-15 DIAGNOSIS — I10 Essential (primary) hypertension: Secondary | ICD-10-CM

## 2017-04-15 LAB — CBC WITH DIFFERENTIAL/PLATELET
BASOS ABS: 0 10*3/uL (ref 0–0.1)
Basophils Relative: 0 %
EOS PCT: 1 %
Eosinophils Absolute: 0 10*3/uL (ref 0–0.7)
HEMATOCRIT: 22.3 % — AB (ref 35.0–47.0)
Hemoglobin: 7.7 g/dL — ABNORMAL LOW (ref 12.0–16.0)
LYMPHS PCT: 50 %
Lymphs Abs: 1.4 10*3/uL (ref 1.0–3.6)
MCH: 32.6 pg (ref 26.0–34.0)
MCHC: 34.4 g/dL (ref 32.0–36.0)
MCV: 94.8 fL (ref 80.0–100.0)
MONO ABS: 0.1 10*3/uL — AB (ref 0.2–0.9)
Monocytes Relative: 5 %
NEUTROS ABS: 1.2 10*3/uL — AB (ref 1.4–6.5)
Neutrophils Relative %: 44 %
PLATELETS: 25 10*3/uL — AB (ref 150–400)
RBC: 2.35 MIL/uL — ABNORMAL LOW (ref 3.80–5.20)
RDW: 17.5 % — AB (ref 11.5–14.5)
WBC: 2.7 10*3/uL — ABNORMAL LOW (ref 3.6–11.0)

## 2017-04-15 LAB — SAMPLE TO BLOOD BANK

## 2017-04-15 LAB — PREPARE RBC (CROSSMATCH)

## 2017-04-15 MED ORDER — LIDOCAINE-PRILOCAINE 2.5-2.5 % EX CREA: 1.0000 "application " | TOPICAL_CREAM | CUTANEOUS | 0 refills | Status: DC | PRN

## 2017-04-15 NOTE — Telephone Encounter (Signed)
Bone marrow biopsy report has been faxed to Dr. Royce Macadamia at [phone].

## 2017-04-15 NOTE — Assessment & Plan Note (Addendum)
#  Severe thrombocytopenia//anemia- highly suspicious for MDS; however repeat bone marrow biopsy April 11, 2017 [third bone marrow]-is still inconclusive; no obvious evidence of acute leukemia or obvious MDS except for mild dysplastic changes; and significantly reduced megakaryocytes.  Of note, tiny/subtle breast cancer metastases was also noted with aspiration.  Awaiting foundation 1 on the aspirate.  We will ask hematopathology for second opinion-given the difficult case; and also since patient follows up with Dr. Royce Macadamia at [facility].  #Today hemoglobin 7.7 platelets 25; hold of platelet transfusion; proceed with PRBC transfusion tomorrow.  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 On Femara.  CT scan shows improving right upper lobe lung nodule. Continue femara.    # s/p mediport-recommend EMLA cream.  #  Cbc/labs/hold tube in 1 week; same in 2 weeks/MD.

## 2017-04-15 NOTE — Progress Notes (Signed)
Patient's plt count 25 today. No plts required per Dr. Rogue Bussing. Read back process performed with md @ 930 am.

## 2017-04-15 NOTE — Progress Notes (Signed)
[facility] [place] OFFICE PROGRESS NOTE  Patient Care Team: [name], MD as PCP - General (Family Medicine) [name], DPM as Consulting Physician (Podiatry) [name], MD as Consulting Physician (Internal Medicine) Robert Bellow, MD (General Surgery)   SUMMARY OF ONCOLOGIC HISTORY:  Oncology History   # April 2018- Left chest wall Bx- IMC; ER-PR-POS; her 2 Neu NEG; PET- mild hilar/distal adenopathy; ~1 cm right lung nodule/ several sub-centimeter.   # [facility] Abemacliclib [on HOLD sec to cytopenia]  # April 2017-isolated Thrombocytopenia- platelets- 113; worsening PANCYTOPENIA- April 2018- BMBx- no evidence of malignancy; MDS/Acute leuk; Karyotype/MDS- FISH panel-NEG [d/w Dr.Smir];   # June 2018- REPEAT BMBx/[facility] [June 2018-Dr.Foster; Aug 2018- Dr.Powell at [facility]]; No Diagnosis.   # May 29th 2018- N-plate- suboptimal improvement; NOV 1st week start promacta 50 mg/day; suboptimal response; DEC 1st- start promacta 75 mg/day  # DEC 18th-INCREASE PROMACTA to 131m/day  # 2006- RIGHT BREAST CA [pT1cN0M0; STAGE I; ER/PRPos; Her 2 Neu-NEG] s/p FEC x 6 [NSABP B-36]; Femara [Dec 2007-2012]  # Osteoporosis s/p Reclast; BMD- 2015-wnl- ca+vit D   Dr.Stewart [derm ? Squamous cell s/p freeze-oct 2018]        Carcinoma of overlapping sites of right breast in female, estrogen receptor positive ([place]    MDS (myelodysplastic syndrome) ([place]     INTERVAL HISTORY:  A very pleasant 72year old female patient With newly diagnosed recurrent breast cancer- ER/PR positive HER-2/neu negative currently on Femara; And pancytopenia/ with more severe thrombocytopenia-is here for follow-up/reviewed the results of the bone marrow biopsy.  The interim patient was evaluated in the emergency room-had nosebleed platelets 10.  Patient needed crossmatch platelets; the next day platelet was 38; underwent Mediport placement on 2/2.  Procedure was  uneventful.  Currently denies any nosebleeds spontaneous.   Complains of shortness of breath with exertion.  No sputum production. She continues to take Femara. No weight loss.  Her appetite is fair.  REVIEW OF SYSTEMS:  A complete 10 point review of system is done which is negative except mentioned above/history of present illness.   PAST MEDICAL HISTORY :  Past Medical History:  Diagnosis Date  . Blood dyscrasia   . Breast cancer ([place]    right, lumpectomy, radiation, chemo  . Breast cancer ([facility]    Right, 2007  . Breast cancer ([place] 06/20/2016   INVASIVE DUCTAL CARCINOMA.  . Collapsed lung 02/14/2017   RIGHT  . Complication of anesthesia    PT STATED AFTER BIL HIP SURGERIES SHE STOPPED BREATHING THE NIGHT AFTER SURGERY  . COPD (chronic obstructive pulmonary disease) ([facility]   . GERD (gastroesophageal reflux disease)   . Headache   . History of chemotherapy   . History of methicillin resistant staphylococcus aureus (MRSA) 2011  . History of radiation therapy   . Hyperlipidemia   . Hypertension   . Liver cancer ([place] 05/2016  . Lung cancer ([place] 05/2016  . Osteoarthritis    right hip  . Osteoporosis   . Squamous cell carcinoma    leg, Followed by Dr. SNicole Kindred   PAST SURGICAL HISTORY :   Past Surgical History:  Procedure Laterality Date  . ABDOMINAL HYSTERECTOMY    . BREAST BIOPSY Left 2002   left breast, calcifications  . BREAST BIOPSY Left 06/20/2016   INVASIVE DUCTAL CARCINOMA.  .Marland KitchenBREAST EXCISIONAL BIOPSY Right 2007   positive  . bunion repair    . ESOPHAGOGASTRODUODENOSCOPY (EGD) WITH PROPOFOL N/A 08/05/2016   Procedure:  ESOPHAGOGASTRODUODENOSCOPY (EGD) WITH PROPOFOL;  Surgeon: San Jetty, MD;  Location: [facility] ENDOSCOPY;  Service: General;  Laterality: N/A;  . JOINT REPLACEMENT    . left breast biopsy    . NASAL SINUS SURGERY    . PORTACATH PLACEMENT Right 04/13/2017   Procedure: INSERTION PORT-A-CATH- RIGHT INTERNAL JUGULAR;  Surgeon: Robert Bellow, MD;   Location: [facility] ORS;  Service: General;  Laterality: Right;  . right hip replacement    . SHOULDER SURGERY    . SQUAMOUS CELL CARCINOMA EXCISION     right leg, Dr. Nicole Kindred  . TOTAL HIP ARTHROPLASTY     right    FAMILY HISTORY :   Family History  Problem Relation Age of Onset  . Heart disease Father 39  . Heart disease Mother   . Hyperlipidemia Mother   . Heart disease Brother   . Diabetes Brother   . Diabetes Maternal Grandmother   . Breast cancer Cousin   . Kidney disease Brother     SOCIAL HISTORY:   Social History   Tobacco Use  . Smoking status: Current Some Day Smoker    Packs/day: 0.10    Years: 30.00    Pack years: 3.00    Types: Cigarettes  . Smokeless tobacco: Never Used  Substance Use Topics  . Alcohol use: Yes    Alcohol/week: 0.0 oz    Comment: socially  . Drug use: No    ALLERGIES:  is allergic to codeine; fish-derived products; morphine sulfate; adhesive [tape]; and oxycodone.  MEDICATIONS:  Current Outpatient Medications  Medication Sig Dispense Refill  . ALPRAZolam (XANAX) 0.5 MG tablet TAKE 1 TABLET BY MOUTH EVERY 8 HOURS AS NEEDED FOR ANXIETY OR SLEEP (Patient taking differently: take 1 tablet by mouth at bedtime) 30 tablet 2  . cyanocobalamin (,VITAMIN B-12,) 1000 MCG/ML injection Inject 1,000 mcg into the muscle every 30 (thirty) days.    . ergocalciferol (VITAMIN D2) 50000 units capsule Take 1 capsule (50,000 Units total) by mouth once a week. (Patient taking differently: Take 50,000 Units by mouth every Saturday. In the morning.) 12 capsule 1  . fexofenadine (ALLEGRA) 180 MG tablet Take 180 mg by mouth every morning.     . Glucosamine-Chondroitin (COSAMIN DS PO) Take 1 tablet by mouth daily. In the morning.    Marland Kitchen letrozole (FEMARA) 2.5 MG tablet Take 1 tablet (2.5 mg total) by mouth daily. Once a day. 90 tablet 3  . metoprolol succinate (TOPROL-XL) 25 MG 24 hr tablet Take 1 tablet (25 mg total) by mouth daily. (Patient taking differently: Take 25  mg by mouth every morning. ) 90 tablet 3  . neomycin-polymyxin-pramoxine (NEOSPORIN PLUS) 1 % cream Apply 1 application topically 2 (two) times daily as needed (Nasal passages as needed for dryness.).    Marland Kitchen pravastatin (PRAVACHOL) 40 MG tablet Take 1 tablet (40 mg total) by mouth daily. (Patient taking differently: Take 40 mg by mouth at bedtime. ) 90 tablet 3  . Probiotic Product (PROBIOTIC & ACIDOPHILUS EX ST PO) Take 1 tablet by mouth daily.     . ranitidine (ZANTAC) 75 MG tablet Take 75 mg by mouth as needed.     . vitamin B-12 (CYANOCOBALAMIN) 1000 MCG tablet Take 1,000 mcg by mouth daily.     Marland Kitchen lidocaine-prilocaine (EMLA) cream Apply 1 application topically as needed. Apply generously over the Mediport 45 minutes prior to chemotherapy. 30 g 0   No current facility-administered medications for this visit.     PHYSICAL EXAMINATION: ECOG PERFORMANCE STATUS:  0 - Asymptomatic  BP 115/79 (BP Location: Right Arm, Patient Position: Sitting)   Pulse 91   Temp 97.8 F (36.6 C) (Tympanic)   Resp 16   Wt 139 lb (63 kg)   BMI 26.26 kg/m   Filed Weights   04/15/17 0933  Weight: 139 lb (63 kg)    GENERAL: Well-nourished well-developed; Alert, no distress and comfortable.  She is with her husband. EYES: WNL.  OROPHARYNX: no thrush or ulceration; good dentition. No bleeding noted. NECK: supple, no masses felt LYMPH:  no palpable lymphadenopathy in the cervical, axillary or inguinal regions LUNGS: Decreased breath sounds to auscultation bilaterally and  No wheeze or crackles HEART/CVS: regular rate & rhythm and no murmurs; No lower extremity edema ABDOMEN:abdomen soft, non-tender and normal bowel sounds Musculoskeletal:no cyanosis of digits and no clubbing  PSYCH: alert & oriented x 3 with fluent speech NEURO: no focal motor/sensory deficits SKIN: Mild chronic ecchymosis.  Not any worse.   LABORATORY DATA:  I have reviewed the data as listed    Component Value Date/Time   NA 136  04/11/2017 0941   K 4.2 04/11/2017 0941   CL 102 04/11/2017 0941   CO2 26 04/11/2017 0941   GLUCOSE 114 (H) 04/11/2017 0941   BUN 11 04/11/2017 0941   CREATININE 0.73 04/11/2017 0941   CREATININE 0.67 07/09/2014 1321   CREATININE 0.70 04/30/2014 1455   CALCIUM 8.8 (L) 04/11/2017 0941   PROT 6.0 (L) 04/11/2017 0941   PROT 7.3 07/09/2014 1321   ALBUMIN 3.5 04/11/2017 0941   ALBUMIN 4.3 07/09/2014 1321   AST 18 04/11/2017 0941   AST 19 07/09/2014 1321   ALT 15 04/11/2017 0941   ALT 18 07/09/2014 1321   ALKPHOS 58 04/11/2017 0941   ALKPHOS 66 07/09/2014 1321   BILITOT 0.3 04/11/2017 0941   BILITOT 0.6 07/09/2014 1321   GFRNONAA >60 04/11/2017 0941   GFRNONAA >60 07/09/2014 1321   GFRAA >60 04/11/2017 0941   GFRAA >60 07/09/2014 1321    No results found for: SPEP, UPEP  Lab Results  Component Value Date   WBC 2.7 (L) 04/15/2017   NEUTROABS 1.2 (L) 04/15/2017   HGB 7.7 (L) 04/15/2017   HCT 22.3 (L) 04/15/2017   MCV 94.8 04/15/2017   PLT 25 (LL) 04/15/2017      Chemistry      Component Value Date/Time   NA 136 04/11/2017 0941   K 4.2 04/11/2017 0941   CL 102 04/11/2017 0941   CO2 26 04/11/2017 0941   BUN 11 04/11/2017 0941   CREATININE 0.73 04/11/2017 0941   CREATININE 0.67 07/09/2014 1321   CREATININE 0.70 04/30/2014 1455      Component Value Date/Time   CALCIUM 8.8 (L) 04/11/2017 0941   ALKPHOS 58 04/11/2017 0941   ALKPHOS 66 07/09/2014 1321   AST 18 04/11/2017 0941   AST 19 07/09/2014 1321   ALT 15 04/11/2017 0941   ALT 18 07/09/2014 1321   BILITOT 0.3 04/11/2017 0941   BILITOT 0.6 07/09/2014 1321        ASSESSMENT & PLAN:   MDS (myelodysplastic syndrome) (HCC) # Severe thrombocytopenia//anemia- highly suspicious for MDS; however repeat bone marrow biopsy April 11, 2017 [third bone marrow]-is still inconclusive; no obvious evidence of acute leukemia or obvious MDS except for mild dysplastic changes; and significantly reduced megakaryocytes.  Of  note, tiny/subtle breast cancer metastases was also noted with aspiration.  Awaiting foundation 1 on the aspirate.  We will ask hematopathology for second opinion-given  the difficult case; and also since patient follows up with Dr. Royce Macadamia at [facility].  #Today hemoglobin 7.7 platelets 25; hold of platelet transfusion; proceed with PRBC transfusion tomorrow.  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 On Femara.  CT scan shows improving right upper lobe lung nodule. Continue femara.    # s/p mediport-recommend EMLA cream.  #  Cbc/labs/hold tube in 1 week; same in 2 weeks/MD.     [name], MD 04/15/2017 1:24 PM

## 2017-04-15 NOTE — Telephone Encounter (Signed)
Please fax a copy of the bone marrow biopsy report to Dr. Rosana Fret. Thx

## 2017-04-16 ENCOUNTER — Inpatient Hospital Stay: Payer: Medicare Other

## 2017-04-16 ENCOUNTER — Other Ambulatory Visit: Payer: Medicare Other

## 2017-04-16 DIAGNOSIS — D61818 Other pancytopenia: Secondary | ICD-10-CM | POA: Diagnosis not present

## 2017-04-16 DIAGNOSIS — Z79811 Long term (current) use of aromatase inhibitors: Secondary | ICD-10-CM | POA: Diagnosis not present

## 2017-04-16 DIAGNOSIS — C50811 Malignant neoplasm of overlapping sites of right female breast: Secondary | ICD-10-CM | POA: Diagnosis not present

## 2017-04-16 DIAGNOSIS — D469 Myelodysplastic syndrome, unspecified: Secondary | ICD-10-CM | POA: Diagnosis not present

## 2017-04-16 DIAGNOSIS — D649 Anemia, unspecified: Secondary | ICD-10-CM

## 2017-04-16 DIAGNOSIS — Z170 Estrogen receptor positive status [ER+]: Secondary | ICD-10-CM | POA: Diagnosis not present

## 2017-04-16 DIAGNOSIS — Z79899 Other long term (current) drug therapy: Secondary | ICD-10-CM | POA: Diagnosis not present

## 2017-04-16 MED ORDER — DIPHENHYDRAMINE HCL 25 MG PO CAPS
25.0000 mg | ORAL_CAPSULE | Freq: Once | ORAL | Status: AC
  Administered 2017-04-16: 25 mg via ORAL
  Filled 2017-04-16: qty 1

## 2017-04-16 MED ORDER — ACETAMINOPHEN 325 MG PO TABS
650.0000 mg | ORAL_TABLET | Freq: Once | ORAL | Status: AC
  Administered 2017-04-16: 650 mg via ORAL
  Filled 2017-04-16: qty 2

## 2017-04-16 MED ORDER — SODIUM CHLORIDE 0.9% FLUSH
10.0000 mL | INTRAVENOUS | Status: DC | PRN
  Administered 2017-04-16: 10 mL via INTRAVENOUS
  Filled 2017-04-16: qty 10

## 2017-04-16 MED ORDER — HEPARIN SOD (PORK) LOCK FLUSH 100 UNIT/ML IV SOLN
500.0000 [IU] | Freq: Once | INTRAVENOUS | Status: AC
  Administered 2017-04-16: 500 [IU] via INTRAVENOUS
  Filled 2017-04-16: qty 5

## 2017-04-16 MED ORDER — ACETAMINOPHEN 325 MG PO TABS: 650.0000 mg | ORAL_TABLET | Freq: Once | ORAL | Status: DC

## 2017-04-16 MED ORDER — SODIUM CHLORIDE 0.9 % IV SOLN
250.0000 mL | Freq: Once | INTRAVENOUS | Status: AC
  Administered 2017-04-16: 250 mL via INTRAVENOUS
  Filled 2017-04-16: qty 250

## 2017-04-16 MED ORDER — METHYLPREDNISOLONE SODIUM SUCC 125 MG IJ SOLR
40.0000 mg | Freq: Once | INTRAMUSCULAR | Status: AC
  Administered 2017-04-16: 40 mg via INTRAVENOUS
  Filled 2017-04-16: qty 2

## 2017-04-17 ENCOUNTER — Ambulatory Visit: Payer: Medicare Other

## 2017-04-17 LAB — TYPE AND SCREEN
ABO/RH(D): O POS
Antibody Screen: POSITIVE
DONOR AG TYPE: NEGATIVE
Unit division: 0

## 2017-04-17 LAB — BPAM RBC
Blood Product Expiration Date: 201902142359
ISSUE DATE / TIME: 201902051416
Unit Type and Rh: 5100

## 2017-04-18 ENCOUNTER — Telehealth: Payer: Self-pay | Admitting: *Deleted

## 2017-04-18 ENCOUNTER — Ambulatory Visit: Payer: Medicare Other | Admitting: Internal Medicine

## 2017-04-18 ENCOUNTER — Ambulatory Visit: Payer: Medicare Other

## 2017-04-18 ENCOUNTER — Other Ambulatory Visit: Payer: Self-pay | Admitting: *Deleted

## 2017-04-18 ENCOUNTER — Inpatient Hospital Stay: Payer: Medicare Other

## 2017-04-18 ENCOUNTER — Encounter: Payer: Self-pay | Admitting: General Surgery

## 2017-04-18 DIAGNOSIS — D696 Thrombocytopenia, unspecified: Secondary | ICD-10-CM

## 2017-04-18 DIAGNOSIS — D61818 Other pancytopenia: Secondary | ICD-10-CM | POA: Diagnosis not present

## 2017-04-18 DIAGNOSIS — Z79811 Long term (current) use of aromatase inhibitors: Secondary | ICD-10-CM | POA: Diagnosis not present

## 2017-04-18 DIAGNOSIS — D469 Myelodysplastic syndrome, unspecified: Secondary | ICD-10-CM | POA: Diagnosis not present

## 2017-04-18 DIAGNOSIS — Z79899 Other long term (current) drug therapy: Secondary | ICD-10-CM | POA: Diagnosis not present

## 2017-04-18 DIAGNOSIS — C50811 Malignant neoplasm of overlapping sites of right female breast: Secondary | ICD-10-CM | POA: Diagnosis not present

## 2017-04-18 DIAGNOSIS — Z170 Estrogen receptor positive status [ER+]: Secondary | ICD-10-CM | POA: Diagnosis not present

## 2017-04-18 LAB — CBC WITH DIFFERENTIAL/PLATELET
BASOS ABS: 0 10*3/uL (ref 0–0.1)
Basophils Relative: 0 %
Eosinophils Absolute: 0 10*3/uL (ref 0–0.7)
Eosinophils Relative: 1 %
HEMATOCRIT: 26.4 % — AB (ref 35.0–47.0)
HEMOGLOBIN: 9.1 g/dL — AB (ref 12.0–16.0)
LYMPHS PCT: 51 %
Lymphs Abs: 1.5 10*3/uL (ref 1.0–3.6)
MCH: 32.4 pg (ref 26.0–34.0)
MCHC: 34.6 g/dL (ref 32.0–36.0)
MCV: 93.5 fL (ref 80.0–100.0)
Monocytes Absolute: 0.2 10*3/uL (ref 0.2–0.9)
Monocytes Relative: 8 %
NEUTROS ABS: 1.1 10*3/uL — AB (ref 1.4–6.5)
NEUTROS PCT: 40 %
Platelets: 12 10*3/uL — CL (ref 150–400)
RBC: 2.82 MIL/uL — AB (ref 3.80–5.20)
RDW: 16 % — ABNORMAL HIGH (ref 11.5–14.5)
WBC: 2.8 10*3/uL — AB (ref 3.6–11.0)

## 2017-04-18 LAB — SAMPLE TO BLOOD BANK

## 2017-04-18 NOTE — Telephone Encounter (Signed)
Spoke with Dr. Rogue Bussing. Ordered 1 unit of cross matched/washed plts for patient. Pt has an apt on schedule for tom. @ 130pm. Pt was instructed to keep these apts.

## 2017-04-18 NOTE — Telephone Encounter (Signed)
Called Critical plt count of 12

## 2017-04-19 ENCOUNTER — Inpatient Hospital Stay: Payer: Medicare Other

## 2017-04-19 DIAGNOSIS — D61818 Other pancytopenia: Secondary | ICD-10-CM | POA: Diagnosis not present

## 2017-04-19 DIAGNOSIS — C50811 Malignant neoplasm of overlapping sites of right female breast: Secondary | ICD-10-CM | POA: Diagnosis not present

## 2017-04-19 DIAGNOSIS — Z170 Estrogen receptor positive status [ER+]: Secondary | ICD-10-CM | POA: Diagnosis not present

## 2017-04-19 DIAGNOSIS — Z79899 Other long term (current) drug therapy: Secondary | ICD-10-CM | POA: Diagnosis not present

## 2017-04-19 DIAGNOSIS — D469 Myelodysplastic syndrome, unspecified: Secondary | ICD-10-CM | POA: Diagnosis not present

## 2017-04-19 DIAGNOSIS — Z79811 Long term (current) use of aromatase inhibitors: Secondary | ICD-10-CM | POA: Diagnosis not present

## 2017-04-19 DIAGNOSIS — D696 Thrombocytopenia, unspecified: Secondary | ICD-10-CM

## 2017-04-19 MED ORDER — DIPHENHYDRAMINE HCL 25 MG PO CAPS
25.0000 mg | ORAL_CAPSULE | Freq: Once | ORAL | Status: AC
  Administered 2017-04-19: 25 mg via ORAL
  Filled 2017-04-19: qty 1

## 2017-04-19 MED ORDER — ACETAMINOPHEN 325 MG PO TABS
650.0000 mg | ORAL_TABLET | Freq: Once | ORAL | Status: AC
  Administered 2017-04-19: 650 mg via ORAL
  Filled 2017-04-19: qty 2

## 2017-04-19 MED ORDER — HEPARIN SOD (PORK) LOCK FLUSH 100 UNIT/ML IV SOLN: 500.0000 [IU] | Freq: Every day | INTRAVENOUS | Status: DC | PRN

## 2017-04-19 MED ORDER — SODIUM CHLORIDE 0.9% FLUSH
10.0000 mL | INTRAVENOUS | Status: AC | PRN
  Administered 2017-04-19: 10 mL
  Filled 2017-04-19: qty 10

## 2017-04-19 MED ORDER — SODIUM CHLORIDE 0.9 % IV SOLN
250.0000 mL | Freq: Once | INTRAVENOUS | Status: AC
  Administered 2017-04-19: 250 mL via INTRAVENOUS
  Filled 2017-04-19: qty 250

## 2017-04-19 MED ORDER — METHYLPREDNISOLONE SODIUM SUCC 125 MG IJ SOLR
40.0000 mg | Freq: Once | INTRAMUSCULAR | Status: AC
  Administered 2017-04-19: 40 mg via INTRAVENOUS
  Filled 2017-04-19: qty 2

## 2017-04-20 LAB — PREPARE PLATELET PHERESIS: Unit division: 0

## 2017-04-20 LAB — BPAM PLATELET PHERESIS
Blood Product Expiration Date: 201902081432
ISSUE DATE / TIME: 201902081357
Unit Type and Rh: 5100

## 2017-04-22 ENCOUNTER — Inpatient Hospital Stay: Payer: Medicare Other

## 2017-04-22 DIAGNOSIS — C50811 Malignant neoplasm of overlapping sites of right female breast: Secondary | ICD-10-CM | POA: Diagnosis not present

## 2017-04-22 DIAGNOSIS — D469 Myelodysplastic syndrome, unspecified: Secondary | ICD-10-CM

## 2017-04-22 DIAGNOSIS — D61818 Other pancytopenia: Secondary | ICD-10-CM | POA: Diagnosis not present

## 2017-04-22 DIAGNOSIS — Z79899 Other long term (current) drug therapy: Secondary | ICD-10-CM | POA: Diagnosis not present

## 2017-04-22 DIAGNOSIS — Z79811 Long term (current) use of aromatase inhibitors: Secondary | ICD-10-CM | POA: Diagnosis not present

## 2017-04-22 DIAGNOSIS — Z170 Estrogen receptor positive status [ER+]: Secondary | ICD-10-CM | POA: Diagnosis not present

## 2017-04-22 LAB — SAMPLE TO BLOOD BANK

## 2017-04-22 LAB — CBC WITH DIFFERENTIAL/PLATELET
BASOS ABS: 0 10*3/uL (ref 0–0.1)
BASOS PCT: 0 %
Eosinophils Absolute: 0 10*3/uL (ref 0–0.7)
Eosinophils Relative: 1 %
HCT: 24.5 % — ABNORMAL LOW (ref 35.0–47.0)
HEMOGLOBIN: 8.4 g/dL — AB (ref 12.0–16.0)
Lymphocytes Relative: 48 %
Lymphs Abs: 1.4 10*3/uL (ref 1.0–3.6)
MCH: 32.2 pg (ref 26.0–34.0)
MCHC: 34.4 g/dL (ref 32.0–36.0)
MCV: 93.7 fL (ref 80.0–100.0)
MONO ABS: 0.2 10*3/uL (ref 0.2–0.9)
Monocytes Relative: 7 %
NEUTROS ABS: 1.3 10*3/uL — AB (ref 1.4–6.5)
NEUTROS PCT: 44 %
Platelets: 21 10*3/uL — CL (ref 150–400)
RBC: 2.62 MIL/uL — AB (ref 3.80–5.20)
RDW: 15.2 % — AB (ref 11.5–14.5)
WBC: 3 10*3/uL — AB (ref 3.6–11.0)

## 2017-04-23 ENCOUNTER — Inpatient Hospital Stay: Payer: Medicare Other

## 2017-04-24 ENCOUNTER — Encounter: Payer: Self-pay | Admitting: Internal Medicine

## 2017-04-25 ENCOUNTER — Encounter: Payer: Self-pay | Admitting: Internal Medicine

## 2017-04-25 ENCOUNTER — Inpatient Hospital Stay: Payer: Medicare Other

## 2017-04-25 ENCOUNTER — Other Ambulatory Visit: Payer: Self-pay | Admitting: *Deleted

## 2017-04-25 ENCOUNTER — Telehealth: Payer: Self-pay | Admitting: *Deleted

## 2017-04-25 DIAGNOSIS — D61818 Other pancytopenia: Secondary | ICD-10-CM | POA: Diagnosis not present

## 2017-04-25 DIAGNOSIS — Z170 Estrogen receptor positive status [ER+]: Principal | ICD-10-CM

## 2017-04-25 DIAGNOSIS — D696 Thrombocytopenia, unspecified: Secondary | ICD-10-CM

## 2017-04-25 DIAGNOSIS — Z79899 Other long term (current) drug therapy: Secondary | ICD-10-CM | POA: Diagnosis not present

## 2017-04-25 DIAGNOSIS — C50811 Malignant neoplasm of overlapping sites of right female breast: Secondary | ICD-10-CM

## 2017-04-25 DIAGNOSIS — D469 Myelodysplastic syndrome, unspecified: Secondary | ICD-10-CM

## 2017-04-25 DIAGNOSIS — Z79811 Long term (current) use of aromatase inhibitors: Secondary | ICD-10-CM | POA: Diagnosis not present

## 2017-04-25 LAB — CBC WITH DIFFERENTIAL/PLATELET
Basophils Absolute: 0 10*3/uL (ref 0–0.1)
Basophils Relative: 0 %
EOS PCT: 1 %
Eosinophils Absolute: 0 10*3/uL (ref 0–0.7)
HEMATOCRIT: 24.7 % — AB (ref 35.0–47.0)
Hemoglobin: 8.6 g/dL — ABNORMAL LOW (ref 12.0–16.0)
LYMPHS ABS: 1.4 10*3/uL (ref 1.0–3.6)
LYMPHS PCT: 47 %
MCH: 32.5 pg (ref 26.0–34.0)
MCHC: 34.7 g/dL (ref 32.0–36.0)
MCV: 93.7 fL (ref 80.0–100.0)
MONO ABS: 0.2 10*3/uL (ref 0.2–0.9)
MONOS PCT: 5 %
Neutro Abs: 1.4 10*3/uL (ref 1.4–6.5)
Neutrophils Relative %: 47 %
PLATELETS: 13 10*3/uL — AB (ref 150–400)
RBC: 2.64 MIL/uL — ABNORMAL LOW (ref 3.80–5.20)
RDW: 15.5 % — AB (ref 11.5–14.5)
WBC: 3 10*3/uL — ABNORMAL LOW (ref 3.6–11.0)

## 2017-04-25 LAB — SAMPLE TO BLOOD BANK

## 2017-04-25 NOTE — Telephone Encounter (Signed)
Ordered 1 unit of plts per md tommorrow

## 2017-04-25 NOTE — Telephone Encounter (Signed)
Critical lab results called, Platelet count is 13 today

## 2017-04-26 ENCOUNTER — Telehealth: Payer: Self-pay | Admitting: *Deleted

## 2017-04-26 ENCOUNTER — Inpatient Hospital Stay: Payer: Medicare Other

## 2017-04-26 NOTE — Telephone Encounter (Signed)
Contacted patient regarding her plts. Blood bank unable to get her unit of plts until tomorrow. Patient opted to have plt infusion on Monday in cancer center. Per blood bank the pre labs for pt's crossmatch for her plts will still be able to be infused on Monday. Pt given new apts times.

## 2017-04-29 ENCOUNTER — Encounter: Payer: Self-pay | Admitting: Internal Medicine

## 2017-04-29 ENCOUNTER — Inpatient Hospital Stay (HOSPITAL_BASED_OUTPATIENT_CLINIC_OR_DEPARTMENT_OTHER): Payer: Medicare Other | Admitting: Internal Medicine

## 2017-04-29 ENCOUNTER — Other Ambulatory Visit: Payer: Self-pay

## 2017-04-29 ENCOUNTER — Inpatient Hospital Stay: Payer: Medicare Other

## 2017-04-29 ENCOUNTER — Other Ambulatory Visit: Payer: Self-pay | Admitting: *Deleted

## 2017-04-29 VITALS — BP 147/78 | HR 123 | Temp 97.9°F | Resp 20 | Ht 61.0 in | Wt 138.0 lb

## 2017-04-29 DIAGNOSIS — K219 Gastro-esophageal reflux disease without esophagitis: Secondary | ICD-10-CM | POA: Diagnosis not present

## 2017-04-29 DIAGNOSIS — Z79811 Long term (current) use of aromatase inhibitors: Secondary | ICD-10-CM

## 2017-04-29 DIAGNOSIS — D469 Myelodysplastic syndrome, unspecified: Secondary | ICD-10-CM

## 2017-04-29 DIAGNOSIS — D61818 Other pancytopenia: Secondary | ICD-10-CM | POA: Diagnosis not present

## 2017-04-29 DIAGNOSIS — J449 Chronic obstructive pulmonary disease, unspecified: Secondary | ICD-10-CM

## 2017-04-29 DIAGNOSIS — Z79899 Other long term (current) drug therapy: Secondary | ICD-10-CM | POA: Diagnosis not present

## 2017-04-29 DIAGNOSIS — C50811 Malignant neoplasm of overlapping sites of right female breast: Secondary | ICD-10-CM | POA: Diagnosis not present

## 2017-04-29 DIAGNOSIS — Z85828 Personal history of other malignant neoplasm of skin: Secondary | ICD-10-CM

## 2017-04-29 DIAGNOSIS — R0602 Shortness of breath: Secondary | ICD-10-CM | POA: Diagnosis not present

## 2017-04-29 DIAGNOSIS — D649 Anemia, unspecified: Secondary | ICD-10-CM

## 2017-04-29 DIAGNOSIS — E785 Hyperlipidemia, unspecified: Secondary | ICD-10-CM | POA: Diagnosis not present

## 2017-04-29 DIAGNOSIS — M810 Age-related osteoporosis without current pathological fracture: Secondary | ICD-10-CM

## 2017-04-29 DIAGNOSIS — M1990 Unspecified osteoarthritis, unspecified site: Secondary | ICD-10-CM | POA: Diagnosis not present

## 2017-04-29 DIAGNOSIS — F17210 Nicotine dependence, cigarettes, uncomplicated: Secondary | ICD-10-CM

## 2017-04-29 DIAGNOSIS — I10 Essential (primary) hypertension: Secondary | ICD-10-CM

## 2017-04-29 DIAGNOSIS — Z170 Estrogen receptor positive status [ER+]: Secondary | ICD-10-CM

## 2017-04-29 DIAGNOSIS — Z90710 Acquired absence of both cervix and uterus: Secondary | ICD-10-CM

## 2017-04-29 DIAGNOSIS — Z8614 Personal history of Methicillin resistant Staphylococcus aureus infection: Secondary | ICD-10-CM

## 2017-04-29 DIAGNOSIS — Z923 Personal history of irradiation: Secondary | ICD-10-CM

## 2017-04-29 DIAGNOSIS — Z803 Family history of malignant neoplasm of breast: Secondary | ICD-10-CM

## 2017-04-29 LAB — CBC WITH DIFFERENTIAL/PLATELET
BASOS ABS: 0 10*3/uL (ref 0–0.1)
BASOS PCT: 0 %
EOS ABS: 0 10*3/uL (ref 0–0.7)
Eosinophils Relative: 0 %
HCT: 20.8 % — ABNORMAL LOW (ref 35.0–47.0)
Hemoglobin: 7.4 g/dL — ABNORMAL LOW (ref 12.0–16.0)
Lymphocytes Relative: 45 %
Lymphs Abs: 1.2 10*3/uL (ref 1.0–3.6)
MCH: 32.6 pg (ref 26.0–34.0)
MCHC: 35.5 g/dL (ref 32.0–36.0)
MCV: 91.7 fL (ref 80.0–100.0)
Monocytes Absolute: 0.1 10*3/uL — ABNORMAL LOW (ref 0.2–0.9)
Monocytes Relative: 5 %
NEUTROS PCT: 50 %
Neutro Abs: 1.3 10*3/uL — ABNORMAL LOW (ref 1.4–6.5)
PLATELETS: 7 10*3/uL — AB (ref 150–400)
RBC: 2.27 MIL/uL — AB (ref 3.80–5.20)
RDW: 14.9 % — ABNORMAL HIGH (ref 11.5–14.5)
WBC: 2.6 10*3/uL — AB (ref 3.6–11.0)

## 2017-04-29 LAB — SAMPLE TO BLOOD BANK

## 2017-04-29 LAB — PNH PROFILE (-HIGH SENSITIVITY)

## 2017-04-29 LAB — PREPARE RBC (CROSSMATCH)

## 2017-04-29 MED ORDER — DIPHENHYDRAMINE HCL 25 MG PO CAPS
25.0000 mg | ORAL_CAPSULE | Freq: Once | ORAL | Status: AC
  Administered 2017-04-29: 25 mg via ORAL
  Filled 2017-04-29: qty 1

## 2017-04-29 MED ORDER — METHYLPREDNISOLONE SODIUM SUCC 125 MG IJ SOLR
40.0000 mg | Freq: Once | INTRAMUSCULAR | Status: AC
  Administered 2017-04-29: 40 mg via INTRAVENOUS
  Filled 2017-04-29: qty 2

## 2017-04-29 MED ORDER — HEPARIN SOD (PORK) LOCK FLUSH 100 UNIT/ML IV SOLN
500.0000 [IU] | Freq: Every day | INTRAVENOUS | Status: DC | PRN
  Filled 2017-04-29: qty 5

## 2017-04-29 MED ORDER — ACETAMINOPHEN 325 MG PO TABS
650.0000 mg | ORAL_TABLET | Freq: Once | ORAL | Status: AC
  Administered 2017-04-29: 650 mg via ORAL
  Filled 2017-04-29: qty 2

## 2017-04-29 MED ORDER — SODIUM CHLORIDE 0.9 % IV SOLN
250.0000 mL | Freq: Once | INTRAVENOUS | Status: AC
  Administered 2017-04-29: 250 mL via INTRAVENOUS
  Filled 2017-04-29: qty 250

## 2017-04-29 NOTE — Assessment & Plan Note (Addendum)
#  Severe thrombocytopenia//anemia- highly suspicious for MDS; however repeat bone marrow biopsy April 11, 2017 [third bone marrow]-is still inconclusive; no obvious evidence of acute leukemia or obvious MDS except for mild dysplastic changes; and significantly reduced megakaryocytes.  Status post second opinion at [facility] hematopathology.  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   Of note, tiny/subtle breast cancer metastases was also noted with aspiration.    #Discussed my concerns of MDS; although not clearly evident on the bone marrow biopsy again.  I will reachout to Dr. Royce Macadamia at [facility].  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 On Femara.  CT scan shows improving right upper lobe lung nodule. Continue femara.    # s/p mediport-recommend EMLA cream.  #  Cbc/labs/hold tube in 1 week; same in 2 weeks/MD.  Addendum: Discussed with Dr. Royce Macadamia; after review of pathology is concern of evolving MDS even though not very clearly noted on the bone marrow specimen.  I discussed with the patient that I would recommend follow-up at [facility] regarding discussion of treatment with hypo-methylating agent-benefits and the risk.  Patient agrees; Dr.Foster's office will reach out to the patient again.   # 40 minutes face-to-face with the patient discussing the above plan of care; more than 50% of time spent on prognosis/ natural history; counseling and coordination.

## 2017-04-29 NOTE — Progress Notes (Signed)
This [place] OFFICE PROGRESS NOTE  Patient Care Team: [name], MD as PCP - General (Family Medicine) [name], DPM as Consulting Physician (Podiatry) [name], MD as Consulting Physician (Internal Medicine) Robert Bellow, MD (General Surgery)   SUMMARY OF ONCOLOGIC HISTORY:  Oncology History   # April 2018- Left chest wall Bx- IMC; ER-PR-POS; her 2 Neu NEG; PET- mild hilar/distal adenopathy; ~1 cm right lung nodule/ several sub-centimeter.   # [facility] Abemacliclib [on HOLD sec to cytopenia]  # April 2017-isolated Thrombocytopenia- platelets- 113; worsening PANCYTOPENIA- April 2018- BMBx- no evidence of malignancy; MDS/Acute leuk; Karyotype/MDS- FISH panel-NEG [d/w Dr.Smir];   # June 2018- REPEAT BMBx/[facility] [June 2018-Dr.Foster; Aug 2018- Dr.Powell at [facility]]; No Diagnosis.   # May 29th 2018- N-plate- suboptimal improvement; NOV 1st week start promacta 50 mg/day; suboptimal response; DEC 1st- start promacta 75 mg/day  # DEC 18th-INCREASE PROMACTA to 157m/day  # 2006- RIGHT BREAST CA [pT1cN0M0; STAGE I; ER/PRPos; Her 2 Neu-NEG] s/p FEC x 6 [NSABP B-36]; Femara [Dec 2007-2012]  # Osteoporosis s/p Reclast; BMD- 2015-wnl- ca+vit D   Dr.Stewart [derm ? Squamous cell s/p freeze-oct 2018]        Carcinoma of overlapping sites of right breast in female, estrogen receptor positive ([facility]    MDS (myelodysplastic syndrome) ([facility]     INTERVAL HISTORY:  A very pleasant 72year old female patient With newly diagnosed recurrent breast cancer- ER/PR positive HER-2/neu negative currently on Femara; And pancytopenia/ with more severe thrombocytopenia-is here for follow-up.  Patient is needing platelet transfusions [washing crossmatched platelets] currently almost once a week; PRBC transfusion every 2 weeks or so.  She feels significantly better after PRBC transfusion.  Currently denies any nosebleeds spontaneous.  She has  been using Afrin for nosebleeds.     No sputum production. She continues to take Femara. No weight loss.  Her appetite is fair.  REVIEW OF SYSTEMS:  A complete 10 point review of system is done which is negative except mentioned above/history of present illness.   PAST MEDICAL HISTORY :  Past Medical History:  Diagnosis Date  . Blood dyscrasia   . Breast cancer ([place]    right, lumpectomy, radiation, chemo  . Breast cancer ([facility]    Right, 2007  . Breast cancer ([facility] 06/20/2016   INVASIVE DUCTAL CARCINOMA.  . Collapsed lung 02/14/2017   RIGHT  . Complication of anesthesia    PT STATED AFTER BIL HIP SURGERIES SHE STOPPED BREATHING THE NIGHT AFTER SURGERY  . COPD (chronic obstructive pulmonary disease) ([facility]   . GERD (gastroesophageal reflux disease)   . Headache   . History of chemotherapy   . History of methicillin resistant staphylococcus aureus (MRSA) 2011  . History of radiation therapy   . Hyperlipidemia   . Hypertension   . Liver cancer ([facility] 05[place]2016  . [place]  . Osteoarthritis    right hip  . Osteoporosis   . Squamous cell carcinoma    leg, Followed by Dr. SNicole Kindred   PAST SURGICAL HISTORY :   Past Surgical History:  Procedure Laterality Date  . ABDOMINAL HYSTERECTOMY    . BREAST BIOPSY Left 2002   left breast, calcifications  . BREAST BIOPSY Left 06/20/2016   INVASIVE DUCTAL CARCINOMA.  .Marland KitchenBREAST EXCISIONAL BIOPSY Right 2007   positive  . bunion repair    . ESOPHAGOGASTRODUODENOSCOPY (EGD) WITH PROPOFOL N/A 08/05/2016   Procedure: ESOPHAGOGASTRODUODENOSCOPY (EGD) WITH PROPOFOL;  Surgeon: SSan Jetty MD;  Location: [facility] ENDOSCOPY;  Service: General;  Laterality: N/A;  . JOINT REPLACEMENT    . left breast biopsy    . NASAL SINUS SURGERY    . PORTACATH PLACEMENT Right 04/13/2017   Procedure: INSERTION PORT-A-CATH- RIGHT INTERNAL JUGULAR;  Surgeon: Robert Bellow, MD;  Location: [facility] ORS;  Service: General;  Laterality: Right;  . right hip  replacement    . SHOULDER SURGERY    . SQUAMOUS CELL CARCINOMA EXCISION     right leg, Dr. Nicole Kindred  . TOTAL HIP ARTHROPLASTY     right    FAMILY HISTORY :   Family History  Problem Relation Age of Onset  . Heart disease Father 54  . Heart disease Mother   . Hyperlipidemia Mother   . Heart disease Brother   . Diabetes Brother   . Diabetes Maternal Grandmother   . Breast cancer Cousin   . Kidney disease Brother     SOCIAL HISTORY:   Social History   Tobacco Use  . Smoking status: Current Some Day Smoker    Packs/day: 0.10    Years: 30.00    Pack years: 3.00    Types: Cigarettes  . Smokeless tobacco: Never Used  Substance Use Topics  . Alcohol use: Yes    Alcohol/week: 0.0 oz    Comment: socially  . Drug use: No    ALLERGIES:  is allergic to codeine; fish-derived products; morphine sulfate; adhesive [tape]; and oxycodone.  MEDICATIONS:  Current Outpatient Medications  Medication Sig Dispense Refill  . ALPRAZolam (XANAX) 0.5 MG tablet TAKE 1 TABLET BY MOUTH EVERY 8 HOURS AS NEEDED FOR ANXIETY OR SLEEP (Patient taking differently: take 1 tablet by mouth at bedtime) 30 tablet 2  . cyanocobalamin (,VITAMIN B-12,) 1000 MCG/ML injection Inject 1,000 mcg into the muscle every 30 (thirty) days.    . ergocalciferol (VITAMIN D2) 50000 units capsule Take 1 capsule (50,000 Units total) by mouth once a week. (Patient taking differently: Take 50,000 Units by mouth every Saturday. In the morning.) 12 capsule 1  . fexofenadine (ALLEGRA) 180 MG tablet Take 180 mg by mouth every morning.     . Glucosamine-Chondroitin (COSAMIN DS PO) Take 1 tablet by mouth daily. In the morning.    Marland Kitchen letrozole (FEMARA) 2.5 MG tablet Take 1 tablet (2.5 mg total) by mouth daily. Once a day. 90 tablet 3  . lidocaine-prilocaine (EMLA) cream Apply 1 application topically as needed. Apply generously over the Mediport 45 minutes prior to chemotherapy. 30 g 0  . neomycin-polymyxin-pramoxine (NEOSPORIN PLUS) 1 %  cream Apply 1 application topically 2 (two) times daily as needed (Nasal passages as needed for dryness.).    Marland Kitchen Probiotic Product (PROBIOTIC & ACIDOPHILUS EX ST PO) Take 1 tablet by mouth daily.     . ranitidine (ZANTAC) 75 MG tablet Take 75 mg by mouth as needed.     . vitamin B-12 (CYANOCOBALAMIN) 1000 MCG tablet Take 1,000 mcg by mouth daily.     . metoprolol succinate (TOPROL-XL) 25 MG 24 hr tablet Take 1 tablet (25 mg total) by mouth daily. 90 tablet 3  . pravastatin (PRAVACHOL) 40 MG tablet Take 1 tablet (40 mg total) by mouth daily. 90 tablet 3   No current facility-administered medications for this visit.     PHYSICAL EXAMINATION: ECOG PERFORMANCE STATUS: 0 - Asymptomatic  BP (!) 147/78 (BP Location: Left Arm, Patient Position: Sitting)   Pulse (!) 123   Temp 97.9 F (36.6 C) (Tympanic)   Resp 20  Ht _0  (1.549 m)   Wt 138 lb (62.6 kg)   SpO2 100%   BMI 26.07 kg/m   Filed Weights   04/29/17 1049  Weight: 138 lb (62.6 kg)    GENERAL: Well-nourished well-developed; Alert, no distress and comfortable.  She is with her husband. EYES: WNL.  OROPHARYNX: no thrush or ulceration; good dentition. No bleeding noted. NECK: supple, no masses felt LYMPH:  no palpable lymphadenopathy in the cervical, axillary or inguinal regions LUNGS: Decreased breath sounds to auscultation bilaterally and  No wheeze or crackles HEART/CVS: regular rate & rhythm and no murmurs; No lower extremity edema ABDOMEN:abdomen soft, non-tender and normal bowel sounds Musculoskeletal:no cyanosis of digits and no clubbing  PSYCH: alert & oriented x 3 with fluent speech NEURO: no focal motor/sensory deficits SKIN: Mild chronic ecchymosis.  Not any worse.   LABORATORY DATA:  I have reviewed the data as listed    Component Value Date/Time   NA 136 04/11/2017 0941   K 4.2 04/11/2017 0941   CL 102 04/11/2017 0941   CO2 26 04/11/2017 0941   GLUCOSE 114 (H) 04/11/2017 0941   BUN 11 04/11/2017 0941    CREATININE 0.73 04/11/2017 0941   CREATININE 0.67 07/09/2014 1321   CREATININE 0.70 04/30/2014 1455   CALCIUM 8.8 (L) 04/11/2017 0941   PROT 6.0 (L) 04/11/2017 0941   PROT 7.3 07/09/2014 1321   ALBUMIN 3.5 04/11/2017 0941   ALBUMIN 4.3 07/09/2014 1321   AST 18 04/11/2017 0941   AST 19 07/09/2014 1321   ALT 15 04/11/2017 0941   ALT 18 07/09/2014 1321   ALKPHOS 58 04/11/2017 0941   ALKPHOS 66 07/09/2014 1321   BILITOT 0.3 04/11/2017 0941   BILITOT 0.6 07/09/2014 1321   GFRNONAA >60 04/11/2017 0941   GFRNONAA >60 07/09/2014 1321   GFRAA >60 04/11/2017 0941   GFRAA >60 07/09/2014 1321    No results found for: SPEP, UPEP  Lab Results  Component Value Date   WBC 2.4 (L) 05/06/2017   NEUTROABS 0.9 (L) 05/06/2017   HGB 8.2 (L) 05/06/2017   HCT 23.7 (L) 05/06/2017   MCV 89.8 05/06/2017   PLT 11 (LL) 05/06/2017      Chemistry      Component Value Date/Time   NA 136 04/11/2017 0941   K 4.2 04/11/2017 0941   CL 102 04/11/2017 0941   CO2 26 04/11/2017 0941   BUN 11 04/11/2017 0941   CREATININE 0.73 04/11/2017 0941   CREATININE 0.67 07/09/2014 1321   CREATININE 0.70 04/30/2014 1455      Component Value Date/Time   CALCIUM 8.8 (L) 04/11/2017 0941   ALKPHOS 58 04/11/2017 0941   ALKPHOS 66 07/09/2014 1321   AST 18 04/11/2017 0941   AST 19 07/09/2014 1321   ALT 15 04/11/2017 0941   ALT 18 07/09/2014 1321   BILITOT 0.3 04/11/2017 0941   BILITOT 0.6 07/09/2014 1321        ASSESSMENT & PLAN:   MDS (myelodysplastic syndrome) (HCC) # Severe thrombocytopenia//anemia- highly suspicious for MDS; however repeat bone marrow biopsy April 11, 2017 [third bone marrow]-is still inconclusive; no obvious evidence of acute leukemia or obvious MDS except for mild dysplastic changes; and significantly reduced megakaryocytes.  Status post second opinion at [facility] hematopathology.  FISH panel negative; cytogenetics-slightly abnormal-clone demonstrating trisomy 18 present in 2 out of 17  metaphase cells.   Of note, tiny/subtle breast cancer metastases was also noted with aspiration.    #Discussed my concerns of MDS; although  not clearly evident on the bone marrow biopsy again.  I will reachout to Dr. Royce Macadamia at [facility].  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 On Femara.  CT scan shows improving right upper lobe lung nodule. Continue femara.    # s/p mediport-recommend EMLA cream.  #  Cbc/labs/hold tube in 1 week; same in 2 weeks/MD.  Addendum: Discussed with Dr. Royce Macadamia; after review of pathology is concern of evolving MDS even though not very clearly noted on the bone marrow specimen.  I discussed with the patient that I would recommend follow-up at [facility] regarding discussion of treatment with hypo-methylating agent-benefits and the risk.  Patient agrees; Dr.Foster's office will reach out to the patient again.        [name], MD 05/07/2017 8:53 AM

## 2017-04-29 NOTE — Progress Notes (Signed)
Patient here for f/u for Breast cancer and thrombocytopenia. Pt reports a nosebleed this am. She is scheduled for 1 unit of plts today. She c/o shortness of breath and fatigue.  Pt has a critical plt count of 7; hgb 7.4 today.

## 2017-04-30 ENCOUNTER — Encounter: Payer: Self-pay | Admitting: Internal Medicine

## 2017-04-30 ENCOUNTER — Inpatient Hospital Stay: Payer: Medicare Other

## 2017-04-30 LAB — TYPE AND SCREEN
ABO/RH(D): O POS
Antibody Screen: POSITIVE
DONOR AG TYPE: NEGATIVE
UNIT DIVISION: 0

## 2017-04-30 LAB — BPAM RBC
Blood Product Expiration Date: 201903142359
ISSUE DATE / TIME: 201902181429
UNIT TYPE AND RH: 5100

## 2017-04-30 LAB — PREPARE PLATELET PHERESIS: Unit division: 0

## 2017-04-30 LAB — BPAM PLATELET PHERESIS
Blood Product Expiration Date: 201902181215
ISSUE DATE / TIME: 201902181203
UNIT TYPE AND RH: 600

## 2017-05-01 ENCOUNTER — Encounter (HOSPITAL_COMMUNITY): Payer: Self-pay

## 2017-05-02 ENCOUNTER — Inpatient Hospital Stay: Payer: Medicare Other

## 2017-05-02 ENCOUNTER — Other Ambulatory Visit: Payer: Self-pay

## 2017-05-02 ENCOUNTER — Telehealth: Payer: Self-pay | Admitting: *Deleted

## 2017-05-02 DIAGNOSIS — D469 Myelodysplastic syndrome, unspecified: Secondary | ICD-10-CM

## 2017-05-02 DIAGNOSIS — Z79811 Long term (current) use of aromatase inhibitors: Secondary | ICD-10-CM | POA: Diagnosis not present

## 2017-05-02 DIAGNOSIS — C50811 Malignant neoplasm of overlapping sites of right female breast: Secondary | ICD-10-CM | POA: Diagnosis not present

## 2017-05-02 DIAGNOSIS — Z79899 Other long term (current) drug therapy: Secondary | ICD-10-CM | POA: Diagnosis not present

## 2017-05-02 DIAGNOSIS — D61818 Other pancytopenia: Secondary | ICD-10-CM | POA: Diagnosis not present

## 2017-05-02 DIAGNOSIS — Z170 Estrogen receptor positive status [ER+]: Secondary | ICD-10-CM | POA: Diagnosis not present

## 2017-05-02 LAB — CBC WITH DIFFERENTIAL/PLATELET
BASOS ABS: 0 10*3/uL (ref 0–0.1)
BASOS PCT: 0 %
Eosinophils Absolute: 0 10*3/uL (ref 0–0.7)
Eosinophils Relative: 0 %
HEMATOCRIT: 26 % — AB (ref 35.0–47.0)
HEMOGLOBIN: 9 g/dL — AB (ref 12.0–16.0)
LYMPHS PCT: 47 %
Lymphs Abs: 1.2 10*3/uL (ref 1.0–3.6)
MCH: 31.2 pg (ref 26.0–34.0)
MCHC: 34.7 g/dL (ref 32.0–36.0)
MCV: 90.1 fL (ref 80.0–100.0)
MONO ABS: 0.2 10*3/uL (ref 0.2–0.9)
MONOS PCT: 6 %
NEUTROS ABS: 1.2 10*3/uL — AB (ref 1.4–6.5)
NEUTROS PCT: 47 %
Platelets: 32 10*3/uL — ABNORMAL LOW (ref 150–440)
RBC: 2.89 MIL/uL — ABNORMAL LOW (ref 3.80–5.20)
RDW: 14.9 % — AB (ref 11.5–14.5)
WBC: 2.6 10*3/uL — ABNORMAL LOW (ref 3.6–11.0)

## 2017-05-02 LAB — SAMPLE TO BLOOD BANK

## 2017-05-02 MED ORDER — PRAVASTATIN SODIUM 40 MG PO TABS: 40.0000 mg | ORAL_TABLET | Freq: Every day | ORAL | 3 refills | Status: DC

## 2017-05-02 MED ORDER — METOPROLOL SUCCINATE ER 25 MG PO TB24: 25.0000 mg | ORAL_TABLET | Freq: Every day | ORAL | 3 refills | Status: DC

## 2017-05-02 NOTE — Telephone Encounter (Signed)
Spoke with Ander Purpura, NP - plts are 32; hgb 9. At this time, pt will not need blood/plts infusion. Pt contacted. Instructed to keep lab only apt on Monday and will move the poss. plts infusion apt to Tuesday next week. Pt gave verbal understanding of the plan of care.

## 2017-05-02 NOTE — Telephone Encounter (Signed)
Last filled metoprolol by Dr.Cook 05/11/16/90 3rf

## 2017-05-02 NOTE — Telephone Encounter (Signed)
Sent to pharmacy 

## 2017-05-02 NOTE — Telephone Encounter (Signed)
Last OV 12/27/16 last filled by Dr.Cook 05/11/16 90 3rf

## 2017-05-03 LAB — CHROMOSOME ANALYSIS, BONE MARROW

## 2017-05-03 LAB — TISSUE HYBRIDIZATION (BONE MARROW)-NCBH

## 2017-05-06 ENCOUNTER — Other Ambulatory Visit: Payer: Self-pay | Admitting: *Deleted

## 2017-05-06 ENCOUNTER — Ambulatory Visit: Payer: Medicare Other

## 2017-05-06 ENCOUNTER — Telehealth: Payer: Self-pay | Admitting: Internal Medicine

## 2017-05-06 ENCOUNTER — Inpatient Hospital Stay: Payer: Medicare Other

## 2017-05-06 ENCOUNTER — Telehealth: Payer: Self-pay | Admitting: *Deleted

## 2017-05-06 DIAGNOSIS — D61818 Other pancytopenia: Secondary | ICD-10-CM | POA: Diagnosis not present

## 2017-05-06 DIAGNOSIS — C50811 Malignant neoplasm of overlapping sites of right female breast: Secondary | ICD-10-CM

## 2017-05-06 DIAGNOSIS — Z170 Estrogen receptor positive status [ER+]: Secondary | ICD-10-CM

## 2017-05-06 DIAGNOSIS — Z79899 Other long term (current) drug therapy: Secondary | ICD-10-CM | POA: Diagnosis not present

## 2017-05-06 DIAGNOSIS — Z79811 Long term (current) use of aromatase inhibitors: Secondary | ICD-10-CM | POA: Diagnosis not present

## 2017-05-06 DIAGNOSIS — D469 Myelodysplastic syndrome, unspecified: Secondary | ICD-10-CM

## 2017-05-06 DIAGNOSIS — D696 Thrombocytopenia, unspecified: Secondary | ICD-10-CM

## 2017-05-06 LAB — SAMPLE TO BLOOD BANK

## 2017-05-06 LAB — CBC WITH DIFFERENTIAL/PLATELET
BASOS PCT: 0 %
Basophils Absolute: 0 10*3/uL (ref 0–0.1)
EOS ABS: 0 10*3/uL (ref 0–0.7)
Eosinophils Relative: 1 %
HEMATOCRIT: 23.7 % — AB (ref 35.0–47.0)
Hemoglobin: 8.2 g/dL — ABNORMAL LOW (ref 12.0–16.0)
LYMPHS ABS: 1.3 10*3/uL (ref 1.0–3.6)
Lymphocytes Relative: 54 %
MCH: 31 pg (ref 26.0–34.0)
MCHC: 34.5 g/dL (ref 32.0–36.0)
MCV: 89.8 fL (ref 80.0–100.0)
MONO ABS: 0.2 10*3/uL (ref 0.2–0.9)
MONOS PCT: 6 %
Neutro Abs: 0.9 10*3/uL — ABNORMAL LOW (ref 1.4–6.5)
Neutrophils Relative %: 39 %
Platelets: 11 10*3/uL — CL (ref 150–400)
RBC: 2.64 MIL/uL — ABNORMAL LOW (ref 3.80–5.20)
RDW: 14.8 % — AB (ref 11.5–14.5)
WBC: 2.4 10*3/uL — ABNORMAL LOW (ref 3.6–11.0)

## 2017-05-06 NOTE — Telephone Encounter (Addendum)
I called to confirm the results of [facility]. Results were to be faxed to cancer center to Dr. Sharmaine Base attn.

## 2017-05-06 NOTE — Telephone Encounter (Signed)
Spoke to pt- will come to discuss the biopsy results.

## 2017-05-07 ENCOUNTER — Inpatient Hospital Stay: Payer: Medicare Other

## 2017-05-07 DIAGNOSIS — D696 Thrombocytopenia, unspecified: Secondary | ICD-10-CM

## 2017-05-07 DIAGNOSIS — D61818 Other pancytopenia: Secondary | ICD-10-CM | POA: Diagnosis not present

## 2017-05-07 DIAGNOSIS — C50811 Malignant neoplasm of overlapping sites of right female breast: Secondary | ICD-10-CM

## 2017-05-07 DIAGNOSIS — Z170 Estrogen receptor positive status [ER+]: Secondary | ICD-10-CM | POA: Diagnosis not present

## 2017-05-07 DIAGNOSIS — Z79811 Long term (current) use of aromatase inhibitors: Secondary | ICD-10-CM | POA: Diagnosis not present

## 2017-05-07 DIAGNOSIS — Z79899 Other long term (current) drug therapy: Secondary | ICD-10-CM | POA: Diagnosis not present

## 2017-05-07 DIAGNOSIS — D469 Myelodysplastic syndrome, unspecified: Secondary | ICD-10-CM | POA: Diagnosis not present

## 2017-05-07 MED ORDER — ACETAMINOPHEN 325 MG PO TABS
650.0000 mg | ORAL_TABLET | Freq: Once | ORAL | Status: AC
  Administered 2017-05-07: 650 mg via ORAL
  Filled 2017-05-07: qty 2

## 2017-05-07 MED ORDER — HEPARIN SOD (PORK) LOCK FLUSH 100 UNIT/ML IV SOLN
500.0000 [IU] | Freq: Every day | INTRAVENOUS | Status: AC | PRN
  Administered 2017-05-07: 500 [IU]
  Filled 2017-05-07 (×2): qty 5

## 2017-05-07 MED ORDER — DIPHENHYDRAMINE HCL 25 MG PO CAPS
25.0000 mg | ORAL_CAPSULE | Freq: Once | ORAL | Status: AC
  Administered 2017-05-07: 25 mg via ORAL
  Filled 2017-05-07: qty 1

## 2017-05-07 MED ORDER — SODIUM CHLORIDE 0.9% FLUSH
10.0000 mL | INTRAVENOUS | Status: DC | PRN
  Filled 2017-05-07: qty 10

## 2017-05-07 MED ORDER — METHYLPREDNISOLONE SODIUM SUCC 125 MG IJ SOLR
40.0000 mg | Freq: Once | INTRAMUSCULAR | Status: AC
  Administered 2017-05-07: 40 mg via INTRAVENOUS
  Filled 2017-05-07: qty 2

## 2017-05-08 ENCOUNTER — Other Ambulatory Visit: Payer: Self-pay | Admitting: *Deleted

## 2017-05-08 DIAGNOSIS — D693 Immune thrombocytopenic purpura: Secondary | ICD-10-CM

## 2017-05-08 DIAGNOSIS — D696 Thrombocytopenia, unspecified: Secondary | ICD-10-CM

## 2017-05-08 LAB — BPAM PLATELET PHERESIS
Blood Product Expiration Date: 201902261430
ISSUE DATE / TIME: 201902261412
UNIT TYPE AND RH: 9500

## 2017-05-08 LAB — PREPARE PLATELET PHERESIS: UNIT DIVISION: 0

## 2017-05-09 ENCOUNTER — Other Ambulatory Visit: Payer: Self-pay | Admitting: *Deleted

## 2017-05-09 ENCOUNTER — Inpatient Hospital Stay: Payer: Medicare Other

## 2017-05-09 DIAGNOSIS — D696 Thrombocytopenia, unspecified: Secondary | ICD-10-CM

## 2017-05-09 DIAGNOSIS — D469 Myelodysplastic syndrome, unspecified: Secondary | ICD-10-CM | POA: Diagnosis not present

## 2017-05-09 DIAGNOSIS — Z79899 Other long term (current) drug therapy: Secondary | ICD-10-CM | POA: Diagnosis not present

## 2017-05-09 DIAGNOSIS — Z79811 Long term (current) use of aromatase inhibitors: Secondary | ICD-10-CM | POA: Diagnosis not present

## 2017-05-09 DIAGNOSIS — D649 Anemia, unspecified: Secondary | ICD-10-CM

## 2017-05-09 DIAGNOSIS — C50811 Malignant neoplasm of overlapping sites of right female breast: Secondary | ICD-10-CM | POA: Diagnosis not present

## 2017-05-09 DIAGNOSIS — Z170 Estrogen receptor positive status [ER+]: Secondary | ICD-10-CM | POA: Diagnosis not present

## 2017-05-09 DIAGNOSIS — D61818 Other pancytopenia: Secondary | ICD-10-CM | POA: Diagnosis not present

## 2017-05-09 LAB — CBC WITH DIFFERENTIAL/PLATELET
Basophils Absolute: 0 10*3/uL (ref 0–0.1)
Basophils Relative: 0 %
EOS ABS: 0 10*3/uL (ref 0–0.7)
Eosinophils Relative: 1 %
HEMATOCRIT: 22.6 % — AB (ref 35.0–47.0)
Hemoglobin: 7.9 g/dL — ABNORMAL LOW (ref 12.0–16.0)
LYMPHS ABS: 1.8 10*3/uL (ref 1.0–3.6)
Lymphocytes Relative: 51 %
MCH: 31.1 pg (ref 26.0–34.0)
MCHC: 35 g/dL (ref 32.0–36.0)
MCV: 88.9 fL (ref 80.0–100.0)
MONOS PCT: 6 %
Monocytes Absolute: 0.2 10*3/uL (ref 0.2–0.9)
NEUTROS PCT: 42 %
Neutro Abs: 1.5 10*3/uL (ref 1.4–6.5)
Platelets: 25 10*3/uL — CL (ref 150–400)
RBC: 2.54 MIL/uL — ABNORMAL LOW (ref 3.80–5.20)
RDW: 14.6 % — ABNORMAL HIGH (ref 11.5–14.5)
WBC: 3.5 10*3/uL — ABNORMAL LOW (ref 3.6–11.0)

## 2017-05-09 LAB — PREPARE RBC (CROSSMATCH)

## 2017-05-09 LAB — SAMPLE TO BLOOD BANK

## 2017-05-10 ENCOUNTER — Inpatient Hospital Stay: Payer: Medicare Other | Attending: Internal Medicine

## 2017-05-10 DIAGNOSIS — D469 Myelodysplastic syndrome, unspecified: Secondary | ICD-10-CM | POA: Insufficient documentation

## 2017-05-10 DIAGNOSIS — J449 Chronic obstructive pulmonary disease, unspecified: Secondary | ICD-10-CM | POA: Insufficient documentation

## 2017-05-10 DIAGNOSIS — D61818 Other pancytopenia: Secondary | ICD-10-CM | POA: Insufficient documentation

## 2017-05-10 DIAGNOSIS — M1990 Unspecified osteoarthritis, unspecified site: Secondary | ICD-10-CM | POA: Diagnosis not present

## 2017-05-10 DIAGNOSIS — I10 Essential (primary) hypertension: Secondary | ICD-10-CM | POA: Diagnosis not present

## 2017-05-10 DIAGNOSIS — D649 Anemia, unspecified: Secondary | ICD-10-CM

## 2017-05-10 DIAGNOSIS — E785 Hyperlipidemia, unspecified: Secondary | ICD-10-CM | POA: Insufficient documentation

## 2017-05-10 DIAGNOSIS — Z923 Personal history of irradiation: Secondary | ICD-10-CM | POA: Diagnosis not present

## 2017-05-10 DIAGNOSIS — C50811 Malignant neoplasm of overlapping sites of right female breast: Secondary | ICD-10-CM | POA: Insufficient documentation

## 2017-05-10 DIAGNOSIS — Z79899 Other long term (current) drug therapy: Secondary | ICD-10-CM | POA: Diagnosis not present

## 2017-05-10 DIAGNOSIS — K219 Gastro-esophageal reflux disease without esophagitis: Secondary | ICD-10-CM | POA: Diagnosis not present

## 2017-05-10 DIAGNOSIS — Z79811 Long term (current) use of aromatase inhibitors: Secondary | ICD-10-CM | POA: Diagnosis not present

## 2017-05-10 DIAGNOSIS — F17210 Nicotine dependence, cigarettes, uncomplicated: Secondary | ICD-10-CM | POA: Insufficient documentation

## 2017-05-10 DIAGNOSIS — C787 Secondary malignant neoplasm of liver and intrahepatic bile duct: Secondary | ICD-10-CM | POA: Diagnosis not present

## 2017-05-10 DIAGNOSIS — C7801 Secondary malignant neoplasm of right lung: Secondary | ICD-10-CM | POA: Insufficient documentation

## 2017-05-10 DIAGNOSIS — Z803 Family history of malignant neoplasm of breast: Secondary | ICD-10-CM | POA: Insufficient documentation

## 2017-05-10 DIAGNOSIS — Z9221 Personal history of antineoplastic chemotherapy: Secondary | ICD-10-CM | POA: Diagnosis not present

## 2017-05-10 DIAGNOSIS — Z8614 Personal history of Methicillin resistant Staphylococcus aureus infection: Secondary | ICD-10-CM | POA: Insufficient documentation

## 2017-05-10 DIAGNOSIS — D696 Thrombocytopenia, unspecified: Secondary | ICD-10-CM | POA: Insufficient documentation

## 2017-05-10 DIAGNOSIS — Z170 Estrogen receptor positive status [ER+]: Secondary | ICD-10-CM | POA: Diagnosis not present

## 2017-05-10 DIAGNOSIS — Z853 Personal history of malignant neoplasm of breast: Secondary | ICD-10-CM | POA: Insufficient documentation

## 2017-05-10 DIAGNOSIS — M810 Age-related osteoporosis without current pathological fracture: Secondary | ICD-10-CM | POA: Insufficient documentation

## 2017-05-10 MED ORDER — SODIUM CHLORIDE 0.9 % IV SOLN
250.0000 mL | Freq: Once | INTRAVENOUS | Status: AC
  Administered 2017-05-10: 250 mL via INTRAVENOUS
  Filled 2017-05-10: qty 250

## 2017-05-10 MED ORDER — ACETAMINOPHEN 325 MG PO TABS
650.0000 mg | ORAL_TABLET | Freq: Once | ORAL | Status: AC
  Administered 2017-05-10: 650 mg via ORAL
  Filled 2017-05-10: qty 2

## 2017-05-10 MED ORDER — METHYLPREDNISOLONE SODIUM SUCC 125 MG IJ SOLR
40.0000 mg | Freq: Once | INTRAMUSCULAR | Status: AC
  Administered 2017-05-10: 40 mg via INTRAVENOUS
  Filled 2017-05-10: qty 2

## 2017-05-10 MED ORDER — DIPHENHYDRAMINE HCL 25 MG PO CAPS
25.0000 mg | ORAL_CAPSULE | Freq: Once | ORAL | Status: AC
  Administered 2017-05-10: 25 mg via ORAL
  Filled 2017-05-10: qty 1

## 2017-05-10 MED ORDER — SODIUM CHLORIDE 0.9% FLUSH
10.0000 mL | INTRAVENOUS | Status: DC | PRN
  Filled 2017-05-10: qty 10

## 2017-05-10 MED ORDER — HEPARIN SOD (PORK) LOCK FLUSH 100 UNIT/ML IV SOLN
500.0000 [IU] | Freq: Every day | INTRAVENOUS | Status: AC | PRN
  Administered 2017-05-10: 500 [IU]
  Filled 2017-05-10: qty 5

## 2017-05-11 LAB — TYPE AND SCREEN
ABO/RH(D): O POS
ANTIBODY SCREEN: POSITIVE
UNIT DIVISION: 0

## 2017-05-11 LAB — BPAM RBC
BLOOD PRODUCT EXPIRATION DATE: 201903142359
ISSUE DATE / TIME: 201903011353
Unit Type and Rh: 5100

## 2017-05-13 ENCOUNTER — Inpatient Hospital Stay: Payer: Medicare Other

## 2017-05-13 ENCOUNTER — Encounter: Payer: Self-pay | Admitting: Internal Medicine

## 2017-05-13 ENCOUNTER — Ambulatory Visit: Payer: Medicare Other

## 2017-05-13 ENCOUNTER — Inpatient Hospital Stay (HOSPITAL_BASED_OUTPATIENT_CLINIC_OR_DEPARTMENT_OTHER): Payer: Medicare Other | Admitting: Internal Medicine

## 2017-05-13 ENCOUNTER — Telehealth: Payer: Self-pay | Admitting: *Deleted

## 2017-05-13 ENCOUNTER — Other Ambulatory Visit: Payer: Self-pay | Admitting: *Deleted

## 2017-05-13 VITALS — BP 133/70 | HR 94 | Temp 98.1°F | Resp 16 | Wt 139.8 lb

## 2017-05-13 DIAGNOSIS — D61818 Other pancytopenia: Secondary | ICD-10-CM

## 2017-05-13 DIAGNOSIS — C50811 Malignant neoplasm of overlapping sites of right female breast: Secondary | ICD-10-CM

## 2017-05-13 DIAGNOSIS — E785 Hyperlipidemia, unspecified: Secondary | ICD-10-CM

## 2017-05-13 DIAGNOSIS — M810 Age-related osteoporosis without current pathological fracture: Secondary | ICD-10-CM

## 2017-05-13 DIAGNOSIS — Z170 Estrogen receptor positive status [ER+]: Secondary | ICD-10-CM | POA: Diagnosis not present

## 2017-05-13 DIAGNOSIS — K219 Gastro-esophageal reflux disease without esophagitis: Secondary | ICD-10-CM

## 2017-05-13 DIAGNOSIS — Z8614 Personal history of Methicillin resistant Staphylococcus aureus infection: Secondary | ICD-10-CM

## 2017-05-13 DIAGNOSIS — D469 Myelodysplastic syndrome, unspecified: Secondary | ICD-10-CM

## 2017-05-13 DIAGNOSIS — F17210 Nicotine dependence, cigarettes, uncomplicated: Secondary | ICD-10-CM

## 2017-05-13 DIAGNOSIS — D696 Thrombocytopenia, unspecified: Secondary | ICD-10-CM

## 2017-05-13 DIAGNOSIS — Z923 Personal history of irradiation: Secondary | ICD-10-CM

## 2017-05-13 DIAGNOSIS — Z9221 Personal history of antineoplastic chemotherapy: Secondary | ICD-10-CM

## 2017-05-13 DIAGNOSIS — Z79899 Other long term (current) drug therapy: Secondary | ICD-10-CM

## 2017-05-13 DIAGNOSIS — C787 Secondary malignant neoplasm of liver and intrahepatic bile duct: Secondary | ICD-10-CM

## 2017-05-13 DIAGNOSIS — C7801 Secondary malignant neoplasm of right lung: Secondary | ICD-10-CM | POA: Diagnosis not present

## 2017-05-13 DIAGNOSIS — J449 Chronic obstructive pulmonary disease, unspecified: Secondary | ICD-10-CM

## 2017-05-13 DIAGNOSIS — Z79811 Long term (current) use of aromatase inhibitors: Secondary | ICD-10-CM

## 2017-05-13 DIAGNOSIS — Z803 Family history of malignant neoplasm of breast: Secondary | ICD-10-CM

## 2017-05-13 DIAGNOSIS — Z853 Personal history of malignant neoplasm of breast: Secondary | ICD-10-CM

## 2017-05-13 DIAGNOSIS — M1990 Unspecified osteoarthritis, unspecified site: Secondary | ICD-10-CM

## 2017-05-13 DIAGNOSIS — I10 Essential (primary) hypertension: Secondary | ICD-10-CM

## 2017-05-13 LAB — CBC WITH DIFFERENTIAL/PLATELET
Basophils Absolute: 0 10*3/uL (ref 0–0.1)
Basophils Relative: 0 %
Eosinophils Absolute: 0 10*3/uL (ref 0–0.7)
Eosinophils Relative: 1 %
HEMATOCRIT: 27.8 % — AB (ref 35.0–47.0)
HEMOGLOBIN: 9.8 g/dL — AB (ref 12.0–16.0)
LYMPHS PCT: 41 %
Lymphs Abs: 1.3 10*3/uL (ref 1.0–3.6)
MCH: 30.5 pg (ref 26.0–34.0)
MCHC: 35.2 g/dL (ref 32.0–36.0)
MCV: 86.6 fL (ref 80.0–100.0)
Monocytes Absolute: 0.2 10*3/uL (ref 0.2–0.9)
Monocytes Relative: 7 %
NEUTROS ABS: 1.6 10*3/uL (ref 1.4–6.5)
Neutrophils Relative %: 51 %
Platelets: 8 10*3/uL — CL (ref 150–400)
RBC: 3.21 MIL/uL — AB (ref 3.80–5.20)
RDW: 14.3 % (ref 11.5–14.5)
WBC: 3.2 10*3/uL — AB (ref 3.6–11.0)

## 2017-05-13 LAB — SAMPLE TO BLOOD BANK

## 2017-05-13 NOTE — Telephone Encounter (Signed)
Lab called and reports that  Platelet count 8 this morning

## 2017-05-13 NOTE — Telephone Encounter (Signed)
md aware. Orders for 1 unit of plts for tom.

## 2017-05-13 NOTE — Progress Notes (Signed)
This [facility] [place] OFFICE PROGRESS NOTE  Patient Care Team: [name], MD as PCP - General (Family Medicine) [name], DPM as Consulting Physician (Podiatry) [name], MD as Consulting Physician (Internal Medicine) Robert Bellow, MD (General Surgery)   SUMMARY OF ONCOLOGIC HISTORY:  Oncology History   # April 2018- Left chest wall Bx- IMC; ER-PR-POS; her 2 Neu NEG; PET- mild hilar/distal adenopathy; ~1 cm right lung nodule/ several sub-centimeter.   # [facility]; Abemacliclib [on HOLD sec to cytopenia]  # April 2017-isolated Thrombocytopenia- platelets- 113; worsening PANCYTOPENIA- April 2018- BMBx- no evidence of malignancy; MDS/Acute leuk; Karyotype/MDS- FISH panel-NEG [d/w Dr.Smir];   # June 2018- REPEAT BMBx/[facility] [June 2018-Dr.Foster; Aug 2018- Dr.Powell at [facility]]; No Diagnosis.   # April 11, 2017 [third bone marrow]-is still inconclusive; no obvious evidence of acute leukemia or obvious MDS except for mild dysplastic changes; and significantly reduced megakaryocytes. 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 [reviwed at [facility]; [facility]-NEG]   # May 29th 2018- N-plate- suboptimal improvement; NOV 1st week start promacta 50 mg/day; suboptimal response; DEC 1st- start promacta 75 mg/day  # DEC 18th-INCREASE PROMACTA to 147m/day  # 2006- RIGHT BREAST CA [pT1cN0M0; STAGE I; ER/PRPos; Her 2 Neu-NEG] s/p FEC x 6 [NSABP B-36]; Femara [Dec 2007-2012]  # Osteoporosis s/p Reclast; BMD- 2015-wnl- ca+vit D   Dr.Stewart [derm ? Squamous cell s/p freeze-oct 2018]        Carcinoma of overlapping sites of right breast in female, estrogen receptor positive ([facility]    MDS (myelodysplastic syndrome) ([facility] [place]     INTERVAL HISTORY:  A very pleasant 72year old female patient With newly diagnosed recurrent breast cancer- ER/PR positive HER-2/neu  negative currently on Femara; And pancytopenia/ with more severe thrombocytopenia-is here for follow-up.  Patient has intermittent nosebleeds for which she uses Afrin nose spray. Patient is needing platelet transfusions [washing crossmatched platelets] currently almost once a week; PRBC transfusion every 2 weeks or so.  She feels significantly better after PRBC transfusion.  She continues to take Femara; no body aches or joint pains.  Denies any headaches.  No new lumps or bumps.  REVIEW OF SYSTEMS:  A complete 10 point review of system is done which is negative except mentioned above/history of present illness.   PAST MEDICAL HISTORY :  Past Medical History:  Diagnosis Date  . Blood dyscrasia   . Breast cancer ([facility]    right, lumpectomy, radiation, chemo  . Breast cancer ([facility]    Right, 2007  . Breast cancer ([place] 06/20/2016   INVASIVE DUCTAL CARCINOMA.  . Collapsed lung 02/14/2017   RIGHT  . Complication of anesthesia    PT STATED AFTER BIL HIP SURGERIES SHE STOPPED BREATHING THE NIGHT AFTER SURGERY  . COPD (chronic obstructive pulmonary disease) ([facility]   . GERD (gastroesophageal reflux disease)   . Headache   . History of chemotherapy   . History of methicillin resistant staphylococcus aureus (MRSA) 2011  . History of radiation therapy   . Hyperlipidemia   . Hypertension   . Liver cancer ([facility] 05/2016  . Lung cancer ([facility] 05/2016  . Osteoarthritis    right hip  . Osteoporosis   . Squamous cell carcinoma    leg, Followed by Dr. SNicole Kindred   PAST SURGICAL HISTORY :   Past Surgical History:  Procedure Laterality Date  . ABDOMINAL HYSTERECTOMY    . BREAST BIOPSY Left 2002   left  breast, calcifications  . BREAST BIOPSY Left 06/20/2016   INVASIVE DUCTAL CARCINOMA.  Marland Kitchen BREAST EXCISIONAL BIOPSY Right 2007   positive  . bunion repair    . ESOPHAGOGASTRODUODENOSCOPY (EGD) WITH PROPOFOL N/A 08/05/2016   Procedure: ESOPHAGOGASTRODUODENOSCOPY (EGD) WITH PROPOFOL;  Surgeon: San Jetty, MD;  Location: [facility] ENDOSCOPY;  Service: General;  Laterality: N/A;  . JOINT REPLACEMENT    . left breast biopsy    . NASAL SINUS SURGERY    . PORTACATH PLACEMENT Right 04/13/2017   Procedure: INSERTION PORT-A-CATH- RIGHT INTERNAL JUGULAR;  Surgeon: Robert Bellow, MD;  Location: [facility] ORS;  Service: General;  Laterality: Right;  . right hip replacement    . SHOULDER SURGERY    . SQUAMOUS CELL CARCINOMA EXCISION     right leg, Dr. Nicole Kindred  . TOTAL HIP ARTHROPLASTY     right    FAMILY HISTORY :   Family History  Problem Relation Age of Onset  . Heart disease Father 75  . Heart disease Mother   . Hyperlipidemia Mother   . Heart disease Brother   . Diabetes Brother   . Diabetes Maternal Grandmother   . Breast cancer Cousin   . Kidney disease Brother     SOCIAL HISTORY:   Social History   Tobacco Use  . Smoking status: Current Some Day Smoker    Packs/day: 0.10    Years: 30.00    Pack years: 3.00    Types: Cigarettes  . Smokeless tobacco: Never Used  Substance Use Topics  . Alcohol use: Yes    Alcohol/week: 0.0 oz    Comment: socially  . Drug use: No    ALLERGIES:  is allergic to codeine; fish-derived products; morphine sulfate; adhesive [tape]; and oxycodone.  MEDICATIONS:  Current Outpatient Medications  Medication Sig Dispense Refill  . ALPRAZolam (XANAX) 0.5 MG tablet TAKE 1 TABLET BY MOUTH EVERY 8 HOURS AS NEEDED FOR ANXIETY OR SLEEP (Patient taking differently: take 1 tablet by mouth at bedtime) 30 tablet 2  . cyanocobalamin (,VITAMIN B-12,) 1000 MCG/ML injection Inject 1,000 mcg into the muscle every 30 (thirty) days.    . ergocalciferol (VITAMIN D2) 50000 units capsule Take 1 capsule (50,000 Units total) by mouth once a week. (Patient taking differently: Take 50,000 Units by mouth every Saturday. In the morning.) 12 capsule 1  . fexofenadine (ALLEGRA) 180 MG tablet Take 180 mg by mouth every morning.     . Glucosamine-Chondroitin (COSAMIN DS PO) Take  1 tablet by mouth daily. In the morning.    Marland Kitchen letrozole (FEMARA) 2.5 MG tablet Take 1 tablet (2.5 mg total) by mouth daily. Once a day. 90 tablet 3  . lidocaine-prilocaine (EMLA) cream Apply 1 application topically as needed. Apply generously over the Mediport 45 minutes prior to chemotherapy. 30 g 0  . metoprolol succinate (TOPROL-XL) 25 MG 24 hr tablet Take 1 tablet (25 mg total) by mouth daily. 90 tablet 3  . neomycin-polymyxin-pramoxine (NEOSPORIN PLUS) 1 % cream Apply 1 application topically 2 (two) times daily as needed (Nasal passages as needed for dryness.).    Marland Kitchen pravastatin (PRAVACHOL) 40 MG tablet Take 1 tablet (40 mg total) by mouth daily. 90 tablet 3  . Probiotic Product (PROBIOTIC & ACIDOPHILUS EX ST PO) Take 1 tablet by mouth daily.     . ranitidine (ZANTAC) 75 MG tablet Take 75 mg by mouth as needed.     . vitamin B-12 (CYANOCOBALAMIN) 1000 MCG tablet Take 1,000 mcg by mouth daily.  No current facility-administered medications for this visit.     PHYSICAL EXAMINATION: ECOG PERFORMANCE STATUS: 0 - Asymptomatic  BP 133/70 (BP Location: Left Arm, Patient Position: Sitting)   Pulse 94   Temp 98.1 F (36.7 C) (Tympanic)   Resp 16   Wt 139 lb 12.8 oz (63.4 kg)   BMI 26.41 kg/m   Filed Weights   05/13/17 0929 05/13/17 0932  Weight: 139 lb 12.8 oz (63.4 kg) 139 lb 12.8 oz (63.4 kg)    GENERAL: Well-nourished well-developed; Alert, no distress and comfortable.  She is alone. EYES: WNL.  OROPHARYNX: no thrush or ulceration; good dentition. No bleeding noted. NECK: supple, no masses felt LYMPH:  no palpable lymphadenopathy in the cervical, axillary or inguinal regions LUNGS: Decreased breath sounds to auscultation bilaterally and  No wheeze or crackles HEART/CVS: regular rate & rhythm and no murmurs; No lower extremity edema ABDOMEN:abdomen soft, non-tender and normal bowel sounds Musculoskeletal:no cyanosis of digits and no clubbing  PSYCH: alert & oriented x 3 with  fluent speech NEURO: no focal motor/sensory deficits SKIN: Mild chronic ecchymosis.  Not any worse.   LABORATORY DATA:  I have reviewed the data as listed    Component Value Date/Time   NA 136 04/11/2017 0941   K 4.2 04/11/2017 0941   CL 102 04/11/2017 0941   CO2 26 04/11/2017 0941   GLUCOSE 114 (H) 04/11/2017 0941   BUN 11 04/11/2017 0941   CREATININE 0.73 04/11/2017 0941   CREATININE 0.67 07/09/2014 1321   CREATININE 0.70 04/30/2014 1455   CALCIUM 8.8 (L) 04/11/2017 0941   PROT 6.0 (L) 04/11/2017 0941   PROT 7.3 07/09/2014 1321   ALBUMIN 3.5 04/11/2017 0941   ALBUMIN 4.3 07/09/2014 1321   AST 18 04/11/2017 0941   AST 19 07/09/2014 1321   ALT 15 04/11/2017 0941   ALT 18 07/09/2014 1321   ALKPHOS 58 04/11/2017 0941   ALKPHOS 66 07/09/2014 1321   BILITOT 0.3 04/11/2017 0941   BILITOT 0.6 07/09/2014 1321   GFRNONAA >60 04/11/2017 0941   GFRNONAA >60 07/09/2014 1321   GFRAA >60 04/11/2017 0941   GFRAA >60 07/09/2014 1321    No results found for: SPEP, UPEP  Lab Results  Component Value Date   WBC 3.2 (L) 05/13/2017   NEUTROABS 1.6 05/13/2017   HGB 9.8 (L) 05/13/2017   HCT 27.8 (L) 05/13/2017   MCV 86.6 05/13/2017   PLT 8 (LL) 05/13/2017      Chemistry      Component Value Date/Time   NA 136 04/11/2017 0941   K 4.2 04/11/2017 0941   CL 102 04/11/2017 0941   CO2 26 04/11/2017 0941   BUN 11 04/11/2017 0941   CREATININE 0.73 04/11/2017 0941   CREATININE 0.67 07/09/2014 1321   CREATININE 0.70 04/30/2014 1455      Component Value Date/Time   CALCIUM 8.8 (L) 04/11/2017 0941   ALKPHOS 58 04/11/2017 0941   ALKPHOS 66 07/09/2014 1321   AST 18 04/11/2017 0941   AST 19 07/09/2014 1321   ALT 15 04/11/2017 0941   ALT 18 07/09/2014 1321   BILITOT 0.3 04/11/2017 0941   BILITOT 0.6 07/09/2014 1321        ASSESSMENT & PLAN:   MDS (myelodysplastic syndrome) (HCC) # Severe thrombocytopenia//anemia- highly suspicious for MDS; however repeat bone marrow biopsy  April 11, 2017 [third bone marrow]-is still inconclusive; no obvious evidence of acute leukemia or obvious MDS except for mild dysplastic changes; and significantly reduced megakaryocytes. FISH panel  negative; cytogenetics-slightly abnormal-clone demonstrating trisomy 18 present in 2 out of 17 metaphase cells.  #Given the subtle cytogenetic changes even in absence of overt MDS morphology in the bone marrow-it is conceivable that patient has underlying MDS at this time.  Discussed at length regarding treatment options include Vidaza/Dacogen [hypo-methylating agents].  Discussed the schedule; possible side effects including drop in blood counts/risk of infections diarrhea etc.  #I have spoken with Dr. Royce Macadamia from [facility]; encourage the patient to call [facility] for an appointment ASAP.  #Today platelets are 8; hemoglobin 9.2; white count 3.2 ANC 1.6-proceed with platelet transfusion tomorrow/washed-crossmatch.  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 On Femara.  CT scan shows improving right upper lobe lung nodule. Continue femara.    # mon/thursday-lab possible platelets.  # follow up in 2week/MD.          [name], MD 05/14/2017 7:20 AM

## 2017-05-13 NOTE — Assessment & Plan Note (Addendum)
#  Severe thrombocytopenia//anemia- highly suspicious for MDS; however repeat bone marrow biopsy April 11, 2017 [third bone marrow]-is still inconclusive; no obvious evidence of acute leukemia or obvious MDS except for mild dysplastic changes; and significantly reduced megakaryocytes. 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  #Given the subtle cytogenetic changes even in absence of overt MDS morphology in the bone marrow-it is conceivable that patient has underlying MDS at this time.  Discussed at length regarding treatment options include Vidaza/Dacogen [hypo-methylating agents].  Discussed the schedule; possible side effects including drop in blood counts/risk of infections diarrhea etc. she understands treatments are palliative.  #I have spoken with Dr. Royce Macadamia from [facility]; encourage the patient to call [facility] for an appointment ASAP.  #Today platelets are 8; hemoglobin 9.2; white count 3.2 ANC 1.6-proceed with platelet transfusion tomorrow/washed-crossmatch.  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 On Femara.  CT scan shows improving right upper lobe lung nodule. Continue femara.    # mon/thursday-lab possible platelets.  # follow up in 2week/MD.

## 2017-05-14 ENCOUNTER — Inpatient Hospital Stay: Payer: Medicare Other

## 2017-05-14 DIAGNOSIS — C50811 Malignant neoplasm of overlapping sites of right female breast: Secondary | ICD-10-CM | POA: Diagnosis not present

## 2017-05-14 DIAGNOSIS — D469 Myelodysplastic syndrome, unspecified: Secondary | ICD-10-CM | POA: Diagnosis not present

## 2017-05-14 DIAGNOSIS — Z79811 Long term (current) use of aromatase inhibitors: Secondary | ICD-10-CM | POA: Diagnosis not present

## 2017-05-14 DIAGNOSIS — Z170 Estrogen receptor positive status [ER+]: Secondary | ICD-10-CM | POA: Diagnosis not present

## 2017-05-14 DIAGNOSIS — C787 Secondary malignant neoplasm of liver and intrahepatic bile duct: Secondary | ICD-10-CM | POA: Diagnosis not present

## 2017-05-14 DIAGNOSIS — D696 Thrombocytopenia, unspecified: Secondary | ICD-10-CM

## 2017-05-14 DIAGNOSIS — C7801 Secondary malignant neoplasm of right lung: Secondary | ICD-10-CM | POA: Diagnosis not present

## 2017-05-14 MED ORDER — METHYLPREDNISOLONE SODIUM SUCC 125 MG IJ SOLR
40.0000 mg | Freq: Once | INTRAMUSCULAR | Status: AC
  Administered 2017-05-14: 40 mg via INTRAVENOUS
  Filled 2017-05-14: qty 2

## 2017-05-14 MED ORDER — ACETAMINOPHEN 325 MG PO TABS
650.0000 mg | ORAL_TABLET | Freq: Once | ORAL | Status: AC
  Administered 2017-05-14: 650 mg via ORAL
  Filled 2017-05-14: qty 2

## 2017-05-14 MED ORDER — SODIUM CHLORIDE 0.9 % IV SOLN
250.0000 mL | Freq: Once | INTRAVENOUS | Status: AC
  Administered 2017-05-14: 250 mL via INTRAVENOUS
  Filled 2017-05-14: qty 250

## 2017-05-14 MED ORDER — DIPHENHYDRAMINE HCL 25 MG PO CAPS
25.0000 mg | ORAL_CAPSULE | Freq: Once | ORAL | Status: AC
  Administered 2017-05-14: 25 mg via ORAL
  Filled 2017-05-14: qty 1

## 2017-05-14 MED ORDER — HEPARIN SOD (PORK) LOCK FLUSH 100 UNIT/ML IV SOLN
500.0000 [IU] | Freq: Every day | INTRAVENOUS | Status: AC | PRN
  Administered 2017-05-14: 500 [IU]
  Filled 2017-05-14: qty 5

## 2017-05-15 ENCOUNTER — Other Ambulatory Visit: Payer: Self-pay | Admitting: *Deleted

## 2017-05-15 ENCOUNTER — Encounter: Payer: Self-pay | Admitting: Internal Medicine

## 2017-05-15 DIAGNOSIS — C50919 Malignant neoplasm of unspecified site of unspecified female breast: Secondary | ICD-10-CM | POA: Diagnosis not present

## 2017-05-15 DIAGNOSIS — D61818 Other pancytopenia: Secondary | ICD-10-CM | POA: Diagnosis not present

## 2017-05-15 DIAGNOSIS — Z170 Estrogen receptor positive status [ER+]: Secondary | ICD-10-CM | POA: Diagnosis not present

## 2017-05-15 DIAGNOSIS — R768 Other specified abnormal immunological findings in serum: Secondary | ICD-10-CM | POA: Diagnosis not present

## 2017-05-15 DIAGNOSIS — C7801 Secondary malignant neoplasm of right lung: Secondary | ICD-10-CM | POA: Diagnosis not present

## 2017-05-15 DIAGNOSIS — D696 Thrombocytopenia, unspecified: Secondary | ICD-10-CM

## 2017-05-15 DIAGNOSIS — D6959 Other secondary thrombocytopenia: Secondary | ICD-10-CM | POA: Diagnosis not present

## 2017-05-15 DIAGNOSIS — D649 Anemia, unspecified: Secondary | ICD-10-CM

## 2017-05-15 DIAGNOSIS — C787 Secondary malignant neoplasm of liver and intrahepatic bile duct: Secondary | ICD-10-CM | POA: Diagnosis not present

## 2017-05-15 DIAGNOSIS — Z79811 Long term (current) use of aromatase inhibitors: Secondary | ICD-10-CM | POA: Diagnosis not present

## 2017-05-15 DIAGNOSIS — D693 Immune thrombocytopenic purpura: Secondary | ICD-10-CM

## 2017-05-15 DIAGNOSIS — D47Z9 Other specified neoplasms of uncertain behavior of lymphoid, hematopoietic and related tissue: Secondary | ICD-10-CM | POA: Diagnosis not present

## 2017-05-15 DIAGNOSIS — D469 Myelodysplastic syndrome, unspecified: Secondary | ICD-10-CM | POA: Diagnosis not present

## 2017-05-15 DIAGNOSIS — C50811 Malignant neoplasm of overlapping sites of right female breast: Secondary | ICD-10-CM | POA: Diagnosis not present

## 2017-05-15 LAB — PREPARE PLATELET PHERESIS: Unit division: 0

## 2017-05-15 LAB — BPAM PLATELET PHERESIS
BLOOD PRODUCT EXPIRATION DATE: 201903051430
ISSUE DATE / TIME: 201903051416
UNIT TYPE AND RH: 6200

## 2017-05-17 ENCOUNTER — Inpatient Hospital Stay: Payer: Medicare Other

## 2017-05-17 ENCOUNTER — Telehealth: Payer: Self-pay

## 2017-05-17 DIAGNOSIS — D649 Anemia, unspecified: Secondary | ICD-10-CM

## 2017-05-17 DIAGNOSIS — D469 Myelodysplastic syndrome, unspecified: Secondary | ICD-10-CM | POA: Diagnosis not present

## 2017-05-17 DIAGNOSIS — C50811 Malignant neoplasm of overlapping sites of right female breast: Secondary | ICD-10-CM | POA: Diagnosis not present

## 2017-05-17 DIAGNOSIS — Z170 Estrogen receptor positive status [ER+]: Secondary | ICD-10-CM | POA: Diagnosis not present

## 2017-05-17 DIAGNOSIS — D696 Thrombocytopenia, unspecified: Secondary | ICD-10-CM

## 2017-05-17 DIAGNOSIS — Z79811 Long term (current) use of aromatase inhibitors: Secondary | ICD-10-CM | POA: Diagnosis not present

## 2017-05-17 DIAGNOSIS — C787 Secondary malignant neoplasm of liver and intrahepatic bile duct: Secondary | ICD-10-CM | POA: Diagnosis not present

## 2017-05-17 DIAGNOSIS — C7801 Secondary malignant neoplasm of right lung: Secondary | ICD-10-CM | POA: Diagnosis not present

## 2017-05-17 LAB — CBC WITH DIFFERENTIAL/PLATELET
BASOS ABS: 0 10*3/uL (ref 0–0.1)
BASOS PCT: 0 %
EOS ABS: 0 10*3/uL (ref 0–0.7)
EOS PCT: 0 %
HCT: 26.1 % — ABNORMAL LOW (ref 35.0–47.0)
Hemoglobin: 8.9 g/dL — ABNORMAL LOW (ref 12.0–16.0)
Lymphocytes Relative: 57 %
Lymphs Abs: 1.9 10*3/uL (ref 1.0–3.6)
MCH: 29.9 pg (ref 26.0–34.0)
MCHC: 34.3 g/dL (ref 32.0–36.0)
MCV: 87.2 fL (ref 80.0–100.0)
MONO ABS: 0.2 10*3/uL (ref 0.2–0.9)
Monocytes Relative: 5 %
Neutro Abs: 1.3 10*3/uL — ABNORMAL LOW (ref 1.4–6.5)
Neutrophils Relative %: 38 %
PLATELETS: 20 10*3/uL — AB (ref 150–400)
RBC: 2.99 MIL/uL — ABNORMAL LOW (ref 3.80–5.20)
RDW: 14.5 % (ref 11.5–14.5)
WBC: 3.3 10*3/uL — ABNORMAL LOW (ref 3.6–11.0)

## 2017-05-17 LAB — SAMPLE TO BLOOD BANK

## 2017-05-17 NOTE — Telephone Encounter (Signed)
Per Dr. Rogue Bussing, patient does not need to come for infusion on Monday - but patient will need to keep scheduled lab appt.

## 2017-05-17 NOTE — Telephone Encounter (Signed)
Patient notified of these lab results and Dr. Aletha Halim recommendations. Patient verbalized understanding.

## 2017-05-17 NOTE — Telephone Encounter (Signed)
Patient would like a call regarding today's lab results and if she need to come Monday for transfusion.

## 2017-05-20 ENCOUNTER — Inpatient Hospital Stay: Payer: Medicare Other

## 2017-05-20 ENCOUNTER — Other Ambulatory Visit: Payer: Self-pay | Admitting: *Deleted

## 2017-05-20 ENCOUNTER — Other Ambulatory Visit: Payer: Medicare Other

## 2017-05-20 DIAGNOSIS — C50811 Malignant neoplasm of overlapping sites of right female breast: Secondary | ICD-10-CM | POA: Diagnosis not present

## 2017-05-20 DIAGNOSIS — D469 Myelodysplastic syndrome, unspecified: Secondary | ICD-10-CM

## 2017-05-20 DIAGNOSIS — C787 Secondary malignant neoplasm of liver and intrahepatic bile duct: Secondary | ICD-10-CM | POA: Diagnosis not present

## 2017-05-20 DIAGNOSIS — C7801 Secondary malignant neoplasm of right lung: Secondary | ICD-10-CM | POA: Diagnosis not present

## 2017-05-20 DIAGNOSIS — D696 Thrombocytopenia, unspecified: Secondary | ICD-10-CM

## 2017-05-20 DIAGNOSIS — Z79811 Long term (current) use of aromatase inhibitors: Secondary | ICD-10-CM | POA: Diagnosis not present

## 2017-05-20 DIAGNOSIS — Z170 Estrogen receptor positive status [ER+]: Secondary | ICD-10-CM | POA: Diagnosis not present

## 2017-05-20 LAB — SAMPLE TO BLOOD BANK

## 2017-05-20 LAB — CBC WITH DIFFERENTIAL/PLATELET
Basophils Absolute: 0 10*3/uL (ref 0–0.1)
Basophils Relative: 0 %
EOS ABS: 0 10*3/uL (ref 0–0.7)
EOS PCT: 0 %
HCT: 24.2 % — ABNORMAL LOW (ref 35.0–47.0)
Hemoglobin: 8.3 g/dL — ABNORMAL LOW (ref 12.0–16.0)
LYMPHS PCT: 51 %
Lymphs Abs: 1.3 10*3/uL (ref 1.0–3.6)
MCH: 29.9 pg (ref 26.0–34.0)
MCHC: 34.6 g/dL (ref 32.0–36.0)
MCV: 86.5 fL (ref 80.0–100.0)
MONO ABS: 0.1 10*3/uL — AB (ref 0.2–0.9)
Monocytes Relative: 5 %
Neutro Abs: 1.1 10*3/uL — ABNORMAL LOW (ref 1.4–6.5)
Neutrophils Relative %: 44 %
PLATELETS: 10 10*3/uL — AB (ref 150–400)
RBC: 2.79 MIL/uL — AB (ref 3.80–5.20)
RDW: 14.5 % (ref 11.5–14.5)
WBC: 2.5 10*3/uL — AB (ref 3.6–11.0)

## 2017-05-21 ENCOUNTER — Inpatient Hospital Stay: Payer: Medicare Other

## 2017-05-21 ENCOUNTER — Other Ambulatory Visit: Payer: Self-pay | Admitting: *Deleted

## 2017-05-21 ENCOUNTER — Encounter: Payer: Self-pay | Admitting: Internal Medicine

## 2017-05-21 VITALS — BP 118/72 | HR 94 | Temp 97.0°F | Resp 18

## 2017-05-21 DIAGNOSIS — D696 Thrombocytopenia, unspecified: Secondary | ICD-10-CM

## 2017-05-21 DIAGNOSIS — Z170 Estrogen receptor positive status [ER+]: Secondary | ICD-10-CM

## 2017-05-21 DIAGNOSIS — C7801 Secondary malignant neoplasm of right lung: Secondary | ICD-10-CM | POA: Diagnosis not present

## 2017-05-21 DIAGNOSIS — Z79811 Long term (current) use of aromatase inhibitors: Secondary | ICD-10-CM | POA: Diagnosis not present

## 2017-05-21 DIAGNOSIS — C50811 Malignant neoplasm of overlapping sites of right female breast: Secondary | ICD-10-CM | POA: Diagnosis not present

## 2017-05-21 DIAGNOSIS — D469 Myelodysplastic syndrome, unspecified: Secondary | ICD-10-CM | POA: Diagnosis not present

## 2017-05-21 DIAGNOSIS — D693 Immune thrombocytopenic purpura: Secondary | ICD-10-CM

## 2017-05-21 DIAGNOSIS — C787 Secondary malignant neoplasm of liver and intrahepatic bile duct: Secondary | ICD-10-CM | POA: Diagnosis not present

## 2017-05-21 MED ORDER — ACETAMINOPHEN 325 MG PO TABS
650.0000 mg | ORAL_TABLET | Freq: Once | ORAL | Status: AC
  Administered 2017-05-21: 650 mg via ORAL
  Filled 2017-05-21: qty 2

## 2017-05-21 MED ORDER — SODIUM CHLORIDE 0.9 % IV SOLN
250.0000 mL | Freq: Once | INTRAVENOUS | Status: AC
  Administered 2017-05-21: 250 mL via INTRAVENOUS
  Filled 2017-05-21: qty 250

## 2017-05-21 MED ORDER — METHYLPREDNISOLONE SODIUM SUCC 125 MG IJ SOLR
40.0000 mg | Freq: Once | INTRAMUSCULAR | Status: AC
  Administered 2017-05-21: 40 mg via INTRAVENOUS
  Filled 2017-05-21: qty 2

## 2017-05-21 MED ORDER — DIPHENHYDRAMINE HCL 25 MG PO CAPS
25.0000 mg | ORAL_CAPSULE | Freq: Once | ORAL | Status: AC
  Administered 2017-05-21: 25 mg via ORAL
  Filled 2017-05-21: qty 1

## 2017-05-21 MED ORDER — HEPARIN SOD (PORK) LOCK FLUSH 100 UNIT/ML IV SOLN
500.0000 [IU] | Freq: Every day | INTRAVENOUS | Status: DC | PRN
  Filled 2017-05-21: qty 5

## 2017-05-21 MED ORDER — CYANOCOBALAMIN 1000 MCG/ML IJ SOLN
1000.0000 ug | Freq: Once | INTRAMUSCULAR | Status: AC
  Administered 2017-05-21: 1000 ug via INTRAMUSCULAR
  Filled 2017-05-21: qty 1

## 2017-05-22 ENCOUNTER — Encounter: Payer: Self-pay | Admitting: Internal Medicine

## 2017-05-22 LAB — PREPARE PLATELET PHERESIS: Unit division: 0

## 2017-05-22 LAB — BPAM PLATELET PHERESIS
Blood Product Expiration Date: 201903121430
ISSUE DATE / TIME: 201903121407
UNIT TYPE AND RH: 9500

## 2017-05-23 ENCOUNTER — Other Ambulatory Visit: Payer: Self-pay | Admitting: *Deleted

## 2017-05-23 ENCOUNTER — Inpatient Hospital Stay: Payer: Medicare Other

## 2017-05-23 DIAGNOSIS — Z79811 Long term (current) use of aromatase inhibitors: Secondary | ICD-10-CM | POA: Diagnosis not present

## 2017-05-23 DIAGNOSIS — Z170 Estrogen receptor positive status [ER+]: Principal | ICD-10-CM

## 2017-05-23 DIAGNOSIS — C50811 Malignant neoplasm of overlapping sites of right female breast: Secondary | ICD-10-CM | POA: Diagnosis not present

## 2017-05-23 DIAGNOSIS — D649 Anemia, unspecified: Secondary | ICD-10-CM

## 2017-05-23 DIAGNOSIS — D469 Myelodysplastic syndrome, unspecified: Secondary | ICD-10-CM

## 2017-05-23 DIAGNOSIS — C787 Secondary malignant neoplasm of liver and intrahepatic bile duct: Secondary | ICD-10-CM | POA: Diagnosis not present

## 2017-05-23 DIAGNOSIS — C7801 Secondary malignant neoplasm of right lung: Secondary | ICD-10-CM | POA: Diagnosis not present

## 2017-05-23 LAB — CBC WITH DIFFERENTIAL/PLATELET
BASOS ABS: 0 10*3/uL (ref 0–0.1)
Basophils Relative: 0 %
EOS PCT: 0 %
Eosinophils Absolute: 0 10*3/uL (ref 0–0.7)
HCT: 22.5 % — ABNORMAL LOW (ref 35.0–47.0)
Hemoglobin: 7.8 g/dL — ABNORMAL LOW (ref 12.0–16.0)
LYMPHS ABS: 1.6 10*3/uL (ref 1.0–3.6)
LYMPHS PCT: 54 %
MCH: 30.2 pg (ref 26.0–34.0)
MCHC: 34.7 g/dL (ref 32.0–36.0)
MCV: 87.1 fL (ref 80.0–100.0)
MONO ABS: 0.1 10*3/uL — AB (ref 0.2–0.9)
Monocytes Relative: 4 %
Neutro Abs: 1.3 10*3/uL — ABNORMAL LOW (ref 1.4–6.5)
Neutrophils Relative %: 42 %
PLATELETS: 33 10*3/uL — AB (ref 150–440)
RBC: 2.58 MIL/uL — AB (ref 3.80–5.20)
RDW: 14.7 % — ABNORMAL HIGH (ref 11.5–14.5)
WBC: 3 10*3/uL — AB (ref 3.6–11.0)

## 2017-05-23 LAB — SAMPLE TO BLOOD BANK

## 2017-05-23 LAB — PREPARE RBC (CROSSMATCH)

## 2017-05-23 MED ORDER — ERGOCALCIFEROL 1.25 MG (50000 UT) PO CAPS: 50000.0000 [IU] | ORAL_CAPSULE | ORAL | 0 refills | Status: DC

## 2017-05-24 ENCOUNTER — Inpatient Hospital Stay: Payer: Medicare Other

## 2017-05-24 DIAGNOSIS — C50811 Malignant neoplasm of overlapping sites of right female breast: Secondary | ICD-10-CM | POA: Diagnosis not present

## 2017-05-24 DIAGNOSIS — Z170 Estrogen receptor positive status [ER+]: Secondary | ICD-10-CM | POA: Diagnosis not present

## 2017-05-24 DIAGNOSIS — C7801 Secondary malignant neoplasm of right lung: Secondary | ICD-10-CM | POA: Diagnosis not present

## 2017-05-24 DIAGNOSIS — C787 Secondary malignant neoplasm of liver and intrahepatic bile duct: Secondary | ICD-10-CM | POA: Diagnosis not present

## 2017-05-24 DIAGNOSIS — D469 Myelodysplastic syndrome, unspecified: Secondary | ICD-10-CM | POA: Diagnosis not present

## 2017-05-24 DIAGNOSIS — D649 Anemia, unspecified: Secondary | ICD-10-CM

## 2017-05-24 DIAGNOSIS — Z79811 Long term (current) use of aromatase inhibitors: Secondary | ICD-10-CM | POA: Diagnosis not present

## 2017-05-24 MED ORDER — METHYLPREDNISOLONE SODIUM SUCC 125 MG IJ SOLR
40.0000 mg | Freq: Once | INTRAMUSCULAR | Status: AC
  Administered 2017-05-24: 40 mg via INTRAVENOUS
  Filled 2017-05-24: qty 2

## 2017-05-24 MED ORDER — HEPARIN SOD (PORK) LOCK FLUSH 100 UNIT/ML IV SOLN
500.0000 [IU] | Freq: Every day | INTRAVENOUS | Status: AC | PRN
  Administered 2017-05-24: 500 [IU]
  Filled 2017-05-24: qty 5

## 2017-05-24 MED ORDER — SODIUM CHLORIDE 0.9 % IV SOLN
250.0000 mL | Freq: Once | INTRAVENOUS | Status: AC
  Administered 2017-05-24: 250 mL via INTRAVENOUS
  Filled 2017-05-24: qty 250

## 2017-05-24 MED ORDER — SODIUM CHLORIDE 0.9% FLUSH
10.0000 mL | INTRAVENOUS | Status: AC | PRN
  Administered 2017-05-24: 10 mL
  Filled 2017-05-24: qty 10

## 2017-05-24 MED ORDER — ACETAMINOPHEN 325 MG PO TABS
650.0000 mg | ORAL_TABLET | Freq: Once | ORAL | Status: AC
  Administered 2017-05-24: 650 mg via ORAL
  Filled 2017-05-24: qty 2

## 2017-05-24 MED ORDER — DIPHENHYDRAMINE HCL 25 MG PO CAPS
25.0000 mg | ORAL_CAPSULE | Freq: Once | ORAL | Status: AC
  Administered 2017-05-24: 25 mg via ORAL
  Filled 2017-05-24: qty 1

## 2017-05-25 LAB — TYPE AND SCREEN
ABO/RH(D): O POS
Antibody Screen: POSITIVE
Donor AG Type: NEGATIVE
UNIT DIVISION: 0

## 2017-05-25 LAB — BPAM RBC
BLOOD PRODUCT EXPIRATION DATE: 201904072359
ISSUE DATE / TIME: 201903151349
UNIT TYPE AND RH: 5100

## 2017-05-27 ENCOUNTER — Other Ambulatory Visit: Payer: Self-pay | Admitting: *Deleted

## 2017-05-27 ENCOUNTER — Inpatient Hospital Stay: Payer: Medicare Other

## 2017-05-27 ENCOUNTER — Inpatient Hospital Stay (HOSPITAL_BASED_OUTPATIENT_CLINIC_OR_DEPARTMENT_OTHER): Payer: Medicare Other | Admitting: Internal Medicine

## 2017-05-27 VITALS — BP 126/74 | Temp 97.9°F | Resp 16 | Wt 139.4 lb

## 2017-05-27 DIAGNOSIS — D61818 Other pancytopenia: Secondary | ICD-10-CM

## 2017-05-27 DIAGNOSIS — D696 Thrombocytopenia, unspecified: Secondary | ICD-10-CM

## 2017-05-27 DIAGNOSIS — D469 Myelodysplastic syndrome, unspecified: Secondary | ICD-10-CM

## 2017-05-27 DIAGNOSIS — Z923 Personal history of irradiation: Secondary | ICD-10-CM

## 2017-05-27 DIAGNOSIS — Z853 Personal history of malignant neoplasm of breast: Secondary | ICD-10-CM

## 2017-05-27 DIAGNOSIS — E785 Hyperlipidemia, unspecified: Secondary | ICD-10-CM

## 2017-05-27 DIAGNOSIS — K219 Gastro-esophageal reflux disease without esophagitis: Secondary | ICD-10-CM | POA: Diagnosis not present

## 2017-05-27 DIAGNOSIS — C787 Secondary malignant neoplasm of liver and intrahepatic bile duct: Secondary | ICD-10-CM

## 2017-05-27 DIAGNOSIS — C7911 Secondary malignant neoplasm of bladder: Secondary | ICD-10-CM | POA: Diagnosis not present

## 2017-05-27 DIAGNOSIS — C50811 Malignant neoplasm of overlapping sites of right female breast: Secondary | ICD-10-CM | POA: Diagnosis not present

## 2017-05-27 DIAGNOSIS — M810 Age-related osteoporosis without current pathological fracture: Secondary | ICD-10-CM

## 2017-05-27 DIAGNOSIS — I10 Essential (primary) hypertension: Secondary | ICD-10-CM

## 2017-05-27 DIAGNOSIS — Z79811 Long term (current) use of aromatase inhibitors: Secondary | ICD-10-CM | POA: Diagnosis not present

## 2017-05-27 DIAGNOSIS — K449 Diaphragmatic hernia without obstruction or gangrene: Secondary | ICD-10-CM | POA: Diagnosis not present

## 2017-05-27 DIAGNOSIS — F17210 Nicotine dependence, cigarettes, uncomplicated: Secondary | ICD-10-CM

## 2017-05-27 DIAGNOSIS — M1990 Unspecified osteoarthritis, unspecified site: Secondary | ICD-10-CM

## 2017-05-27 DIAGNOSIS — Z8614 Personal history of Methicillin resistant Staphylococcus aureus infection: Secondary | ICD-10-CM

## 2017-05-27 DIAGNOSIS — Z803 Family history of malignant neoplasm of breast: Secondary | ICD-10-CM

## 2017-05-27 DIAGNOSIS — Z170 Estrogen receptor positive status [ER+]: Secondary | ICD-10-CM

## 2017-05-27 DIAGNOSIS — C7801 Secondary malignant neoplasm of right lung: Secondary | ICD-10-CM

## 2017-05-27 DIAGNOSIS — Z9221 Personal history of antineoplastic chemotherapy: Secondary | ICD-10-CM

## 2017-05-27 DIAGNOSIS — Z79899 Other long term (current) drug therapy: Secondary | ICD-10-CM

## 2017-05-27 LAB — CBC WITH DIFFERENTIAL/PLATELET
Basophils Absolute: 0 10*3/uL (ref 0–0.1)
Basophils Relative: 0 %
EOS PCT: 1 %
Eosinophils Absolute: 0 10*3/uL (ref 0–0.7)
HCT: 26.7 % — ABNORMAL LOW (ref 35.0–47.0)
Hemoglobin: 9.3 g/dL — ABNORMAL LOW (ref 12.0–16.0)
LYMPHS ABS: 1.1 10*3/uL (ref 1.0–3.6)
LYMPHS PCT: 55 %
MCH: 30.1 pg (ref 26.0–34.0)
MCHC: 34.9 g/dL (ref 32.0–36.0)
MCV: 86.4 fL (ref 80.0–100.0)
Monocytes Absolute: 0.1 10*3/uL — ABNORMAL LOW (ref 0.2–0.9)
Monocytes Relative: 5 %
Neutro Abs: 0.8 10*3/uL — ABNORMAL LOW (ref 1.4–6.5)
Neutrophils Relative %: 39 %
PLATELETS: 12 10*3/uL — AB (ref 150–400)
RBC: 3.09 MIL/uL — AB (ref 3.80–5.20)
RDW: 14.9 % — ABNORMAL HIGH (ref 11.5–14.5)
WBC: 1.9 10*3/uL — AB (ref 3.6–11.0)

## 2017-05-27 LAB — SAMPLE TO BLOOD BANK

## 2017-05-27 NOTE — Progress Notes (Signed)
This [facility] [place] OFFICE PROGRESS NOTE  Patient Care Team: [name], MD as PCP - General (Family Medicine) [name], DPM as Consulting Physician (Podiatry) [name], MD as Consulting Physician (Internal Medicine) Robert Bellow, MD (General Surgery)   SUMMARY OF ONCOLOGIC HISTORY:  Oncology History   # April 2018- Left chest wall Bx- IMC; ER-PR-POS; her 2 Neu NEG; PET- mild hilar/distal adenopathy; ~1 cm right lung nodule/ several sub-centimeter.   # May 2018[facility]; Abemacliclib [on HOLD sec to cytopenia]  # April 2017-isolated Thrombocytopenia- platelets- 113; worsening PANCYTOPENIA- April 2018- BMBx- no evidence of malignancy; MDS/Acute leuk; Karyotype/MDS- FISH panel-NEG [d/w Dr.Smir];   # June 2018- REPEAT BMBx/[facility] [June 2018-Dr.Foster; Aug 2018- Dr.Powell at [facility]]; No Diagnosis.   # April 11, 2017 [third bone marrow]-is still inconclusive; no obvious evidence of acute leukemia or obvious MDS except for mild dysplastic changes; and significantly reduced megakaryocytes. 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 [reviwed at [facility]; [facility]-NEG]   # May 29th 2018- N-plate- suboptimal improvement; NOV 1st week start promacta 50 mg/day; suboptimal response; DEC 1st- start promacta 75 mg/day  # DEC 18th-INCREASE PROMACTA to 128m/day  # 2006- RIGHT BREAST CA [pT1cN0M0; STAGE I; ER/PRPos; Her 2 Neu-NEG] s/p FEC x 6 [NSABP B-36]; Femara [Dec 2007-2012]  # Osteoporosis s/p Reclast; BMD- 2015-wnl- ca+vit D   Dr.Stewart [derm ? Squamous cell s/p freeze-oct 2018]        Carcinoma of overlapping sites of right breast in female, estrogen receptor positive ([place]    MDS (myelodysplastic syndrome) ([place]     INTERVAL HISTORY:  A very pleasant 71year old female patient With newly diagnosed recurrent breast cancer- ER/PR positive HER-2/neu  negative currently on Femara; And pancytopenia/ with more severe thrombocytopenia-is here for follow-up.  [facility] FD[facility]ison Mascotreview the treatment plan for possible MDS.  Patient has intermittent nosebleeds for which he uses Afrin nose spray.  Patient has been needing platelet transfusions approximately once a week and PRBC transfusion every 2-4 weeks. All She continues to take Femara; no body aches or joint pains.  Denies any headaches.  No new lumps or bumps.  REVIEW OF SYSTEMS:  A complete 10 point review of system is done which is negative except mentioned above/history of present illness.   PAST MEDICAL HISTORY :  Past Medical History:  Diagnosis Date  . Blood dyscrasia   . Breast cancer ([facility]    right, lumpectomy, radiation, chemo  . Breast cancer ([facility]    Right, 2007  . Breast cancer ([facility] 06/20/2016   INVASIVE DUCTAL CARCINOMA.  . Collapsed lung 02/14/2017   RIGHT  . Complication of anesthesia    PT STATED AFTER BIL HIP SURGERIES SHE STOPPED BREATHING THE NIGHT AFTER SURGERY  . COPD (chronic obstructive pulmonary disease) ([facility]   . GERD (gastroesophageal reflux disease)   . Headache   . History of chemotherapy   . History of methicillin resistant staphylococcus aureus (MRSA) 2011  . History of radiation therapy   . Hyperlipidemia   . Hypertension   . Liver cancer ([place] 05/2016  . Lung cancer ([place] 05/2016  . Osteoarthritis    right hip  . Osteoporosis   . Squamous cell carcinoma    leg, Followed by Dr. SNicole Kindred   PAST SURGICAL HISTORY :   Past Surgical History:  Procedure Laterality Date  . ABDOMINAL HYSTERECTOMY    .  BREAST BIOPSY Left 2002   left breast, calcifications  . BREAST BIOPSY Left 06/20/2016   INVASIVE DUCTAL CARCINOMA.  Marland Kitchen BREAST EXCISIONAL BIOPSY Right 2007   positive  . bunion repair    . ESOPHAGOGASTRODUODENOSCOPY (EGD) WITH PROPOFOL N/A 08/05/2016   Procedure: ESOPHAGOGASTRODUODENOSCOPY (EGD) WITH  PROPOFOL;  Surgeon: San Jetty, MD;  Location: [facility] ENDOSCOPY;  Service: General;  Laterality: N/A;  . JOINT REPLACEMENT    . left breast biopsy    . NASAL SINUS SURGERY    . PORTACATH PLACEMENT Right 04/13/2017   Procedure: INSERTION PORT-A-CATH- RIGHT INTERNAL JUGULAR;  Surgeon: Robert Bellow, MD;  Location: [facility] ORS;  Service: General;  Laterality: Right;  . right hip replacement    . SHOULDER SURGERY    . SQUAMOUS CELL CARCINOMA EXCISION     right leg, Dr. Nicole Kindred  . TOTAL HIP ARTHROPLASTY     right    FAMILY HISTORY :   Family History  Problem Relation Age of Onset  . Heart disease Father 82  . Heart disease Mother   . Hyperlipidemia Mother   . Heart disease Brother   . Diabetes Brother   . Diabetes Maternal Grandmother   . Breast cancer Cousin   . Kidney disease Brother     SOCIAL HISTORY:   Social History   Tobacco Use  . Smoking status: Current Some Day Smoker    Packs/day: 0.10    Years: 30.00    Pack years: 3.00    Types: Cigarettes  . Smokeless tobacco: Never Used  Substance Use Topics  . Alcohol use: Yes    Alcohol/week: 0.0 oz    Comment: socially  . Drug use: No    ALLERGIES:  is allergic to codeine; fish-derived products; morphine sulfate; adhesive [tape]; and oxycodone.  MEDICATIONS:  Current Outpatient Medications  Medication Sig Dispense Refill  . ALPRAZolam (XANAX) 0.5 MG tablet TAKE 1 TABLET BY MOUTH EVERY 8 HOURS AS NEEDED FOR ANXIETY OR SLEEP (Patient taking differently: take 1 tablet by mouth at bedtime) 30 tablet 2  . cyanocobalamin (,VITAMIN B-12,) 1000 MCG/ML injection Inject 1,000 mcg into the muscle every 30 (thirty) days.    . ergocalciferol (VITAMIN D2) 50000 units capsule Take 1 capsule (50,000 Units total) by mouth every Saturday. In the morning. 12 capsule 0  . fexofenadine (ALLEGRA) 180 MG tablet Take 180 mg by mouth every morning.     . Glucosamine-Chondroitin (COSAMIN DS PO) Take 1 tablet by mouth daily. In the morning.     Marland Kitchen letrozole (FEMARA) 2.5 MG tablet Take 1 tablet (2.5 mg total) by mouth daily. Once a day. 90 tablet 3  . lidocaine-prilocaine (EMLA) cream Apply 1 application topically as needed. Apply generously over the Mediport 45 minutes prior to chemotherapy. 30 g 0  . metoprolol succinate (TOPROL-XL) 25 MG 24 hr tablet Take 1 tablet (25 mg total) by mouth daily. 90 tablet 3  . neomycin-polymyxin-pramoxine (NEOSPORIN PLUS) 1 % cream Apply 1 application topically 2 (two) times daily as needed (Nasal passages as needed for dryness.).    Marland Kitchen pravastatin (PRAVACHOL) 40 MG tablet Take 1 tablet (40 mg total) by mouth daily. 90 tablet 3  . Probiotic Product (PROBIOTIC & ACIDOPHILUS EX ST PO) Take 1 tablet by mouth daily.     . ranitidine (ZANTAC) 75 MG tablet Take 75 mg by mouth as needed.     . vitamin B-12 (CYANOCOBALAMIN) 1000 MCG tablet Take 1,000 mcg by mouth daily.      No current  facility-administered medications for this visit.     PHYSICAL EXAMINATION: ECOG PERFORMANCE STATUS: 0 - Asymptomatic  BP 126/74 (BP Location: Left Arm, Patient Position: Sitting)   Temp 97.9 F (36.6 C) (Tympanic)   Resp 16   Wt 139 lb 6.4 oz (63.2 kg)   BMI 26.34 kg/m   Filed Weights   05/27/17 1204 05/27/17 1215  Weight: 139 lb 9.6 oz (63.3 kg) 139 lb 6.4 oz (63.2 kg)    GENERAL: Well-nourished well-developed; Alert, no distress and comfortable.  She is alone. EYES: WNL.  OROPHARYNX: no thrush or ulceration; good dentition. No bleeding noted. NECK: supple, no masses felt LYMPH:  no palpable lymphadenopathy in the cervical, axillary or inguinal regions LUNGS: Decreased breath sounds to auscultation bilaterally and  No wheeze or crackles HEART/CVS: regular rate & rhythm and no murmurs; No lower extremity edema ABDOMEN:abdomen soft, non-tender and normal bowel sounds Musculoskeletal:no cyanosis of digits and no clubbing  PSYCH: alert & oriented x 3 with fluent speech NEURO: no focal motor/sensory deficits SKIN:  Mild chronic ecchymosis.  Not any worse.   LABORATORY DATA:  I have reviewed the data as listed    Component Value Date/Time   NA 136 04/11/2017 0941   K 4.2 04/11/2017 0941   CL 102 04/11/2017 0941   CO2 26 04/11/2017 0941   GLUCOSE 114 (H) 04/11/2017 0941   BUN 11 04/11/2017 0941   CREATININE 0.73 04/11/2017 0941   CREATININE 0.67 07/09/2014 1321   CREATININE 0.70 04/30/2014 1455   CALCIUM 8.8 (L) 04/11/2017 0941   PROT 6.0 (L) 04/11/2017 0941   PROT 7.3 07/09/2014 1321   ALBUMIN 3.5 04/11/2017 0941   ALBUMIN 4.3 07/09/2014 1321   AST 18 04/11/2017 0941   AST 19 07/09/2014 1321   ALT 15 04/11/2017 0941   ALT 18 07/09/2014 1321   ALKPHOS 58 04/11/2017 0941   ALKPHOS 66 07/09/2014 1321   BILITOT 0.3 04/11/2017 0941   BILITOT 0.6 07/09/2014 1321   GFRNONAA >60 04/11/2017 0941   GFRNONAA >60 07/09/2014 1321   GFRAA >60 04/11/2017 0941   GFRAA >60 07/09/2014 1321    No results found for: SPEP, UPEP  Lab Results  Component Value Date   WBC 1.9 (L) 05/27/2017   NEUTROABS 0.8 (L) 05/27/2017   HGB 9.3 (L) 05/27/2017   HCT 26.7 (L) 05/27/2017   MCV 86.4 05/27/2017   PLT 12 (LL) 05/27/2017      Chemistry      Component Value Date/Time   NA 136 04/11/2017 0941   K 4.2 04/11/2017 0941   CL 102 04/11/2017 0941   CO2 26 04/11/2017 0941   BUN 11 04/11/2017 0941   CREATININE 0.73 04/11/2017 0941   CREATININE 0.67 07/09/2014 1321   CREATININE 0.70 04/30/2014 1455      Component Value Date/Time   CALCIUM 8.8 (L) 04/11/2017 0941   ALKPHOS 58 04/11/2017 0941   ALKPHOS 66 07/09/2014 1321   AST 18 04/11/2017 0941   AST 19 07/09/2014 1321   ALT 15 04/11/2017 0941   ALT 18 07/09/2014 1321   BILITOT 0.3 04/11/2017 0941   BILITOT 0.6 07/09/2014 1321        ASSESSMENT & PLAN:   MDS (myelodysplastic syndrome) (HCC) # Severe thrombocytopenia//anemia- highly suspicious for MDS; however repeat bone marrow biopsy April 11, 2017 [third bone marrow]-is still  inconclusive; no obvious evidence of acute leukemia or obvious MDS except for mild dysplastic changes; and significantly reduced megakaryocytes. FISH panel negative; cytogenetics-slightly abnormal-clone demonstrating trisomy  18 present in 2 out of 17 metaphase cells.  Foundation 1- negative for any genetic aberrations.  #  #Given the clinical suspicion of MDS-recommend treatment with Vidaza/Dacogen [hypo-methylating agents].  Discussed the schedule; possible side effects including drop in blood counts/risk of infections diarrhea etc. she understands treatments are palliative.  #Proceed with Dacogen 20 mg/m square day 1 through 5 every 4 weeks.  Patient is interested in IV treatments.  Long discussion with the patient that responses are usually noted only after 2-4 cycles.   #Today platelets are 12; hemoglobin 9.2; white count 3.2 ANC 1.6-proceed with platelet transfusion tomorrow/washed-crossmatch.  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 On Femara.  CT scan shows improving right upper lobe lung nodule. Continue femara.  Recommend a PET scan for evaluation -for possible bone marrow involvement.  # mon/thursday-lab possible platelets.  #Follow-up in 2 weeks Dacogen Monday through Friday; Mon-Friday. Discussed with Dr.Foster.  Very much appreciate Dr. Luis Abed expertise/recommendations.      [name], MD 05/27/2017 6:32 PM

## 2017-05-27 NOTE — Assessment & Plan Note (Addendum)
#  Severe thrombocytopenia//anemia- highly suspicious for MDS; however repeat bone marrow biopsy April 11, 2017 [third bone marrow]-is still inconclusive; no obvious evidence of acute leukemia or obvious MDS except for mild dysplastic changes; and significantly reduced megakaryocytes. 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  Foundation 1- negative for any genetic aberrations.  #  #Given the clinical suspicion of MDS-recommend treatment with Vidaza/Dacogen [hypo-methylating agents].  Discussed the schedule; possible side effects including drop in blood counts/risk of infections diarrhea etc. she understands treatments are palliative.  #Proceed with Dacogen 20 mg/m square day 1 through 5 every 4 weeks.  Patient is interested in IV treatments.  Long discussion with the patient that responses are usually noted only after 2-4 cycles.   #Today platelets are 12; hemoglobin 9.2; white count 3.2 ANC 1.6-proceed with platelet transfusion tomorrow/washed-crossmatch.  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 On Femara.  CT scan shows improving right upper lobe lung nodule. Continue femara.  Recommend a PET scan for evaluation -for possible bone marrow involvement.  # mon/thursday-lab possible platelets.  #Follow-up in 2 weeks Dacogen Monday through Friday; Mon-Friday. Discussed with Dr.Foster.  Very much appreciate Dr. Luis Abed expertise/recommendations.

## 2017-05-27 NOTE — Progress Notes (Signed)
START ON PATHWAY REGIMEN - MDS     A cycle is every 28 days:     Azacitidine   **Always confirm dose/schedule in your pharmacy ordering system**    Patient Characteristics: Higher-Risk (IPSS-R Score > 3.5), First Line, Not a Transplant Candidate WHO Disease Classification: MDS-MLD Bone Marrow Blasts (percent): ? 2% Cytogenetic Category: Poor Platelets (x 10^9/L): < 50 Absolute Neutrophil Count (x 10^9/L): ? 0.8 Line of Therapy: First Line IPSS-R Risk Category: High IPSS-R Risk Score: 5.5 Check here if patient's risk score was calculated prior to the International Prognostic Scoring System-Revised (IPSS-R): false Hemoglobin (g/dl): < 8 Patient Characteristics: Not a Transplant Candidate Intent of Therapy: Non-Curative / Palliative Intent, Discussed with Patient

## 2017-05-28 ENCOUNTER — Inpatient Hospital Stay: Payer: Medicare Other

## 2017-05-28 DIAGNOSIS — Z170 Estrogen receptor positive status [ER+]: Secondary | ICD-10-CM

## 2017-05-28 DIAGNOSIS — D696 Thrombocytopenia, unspecified: Secondary | ICD-10-CM

## 2017-05-28 DIAGNOSIS — C50811 Malignant neoplasm of overlapping sites of right female breast: Secondary | ICD-10-CM

## 2017-05-28 DIAGNOSIS — C7801 Secondary malignant neoplasm of right lung: Secondary | ICD-10-CM | POA: Diagnosis not present

## 2017-05-28 DIAGNOSIS — D469 Myelodysplastic syndrome, unspecified: Secondary | ICD-10-CM | POA: Diagnosis not present

## 2017-05-28 DIAGNOSIS — C787 Secondary malignant neoplasm of liver and intrahepatic bile duct: Secondary | ICD-10-CM | POA: Diagnosis not present

## 2017-05-28 DIAGNOSIS — Z79811 Long term (current) use of aromatase inhibitors: Secondary | ICD-10-CM | POA: Diagnosis not present

## 2017-05-28 MED ORDER — METHYLPREDNISOLONE SODIUM SUCC 125 MG IJ SOLR
40.0000 mg | Freq: Once | INTRAMUSCULAR | Status: AC
  Administered 2017-05-28: 40 mg via INTRAVENOUS
  Filled 2017-05-28: qty 2

## 2017-05-28 MED ORDER — SODIUM CHLORIDE 0.9% FLUSH
10.0000 mL | INTRAVENOUS | Status: AC | PRN
  Administered 2017-05-28: 10 mL
  Filled 2017-05-28: qty 10

## 2017-05-28 MED ORDER — DIPHENHYDRAMINE HCL 25 MG PO CAPS
25.0000 mg | ORAL_CAPSULE | Freq: Once | ORAL | Status: AC
  Administered 2017-05-28: 25 mg via ORAL
  Filled 2017-05-28: qty 1

## 2017-05-28 MED ORDER — SODIUM CHLORIDE 0.9 % IV SOLN
250.0000 mL | Freq: Once | INTRAVENOUS | Status: AC
  Administered 2017-05-28: 250 mL via INTRAVENOUS
  Filled 2017-05-28: qty 250

## 2017-05-28 MED ORDER — HEPARIN SOD (PORK) LOCK FLUSH 100 UNIT/ML IV SOLN
500.0000 [IU] | Freq: Every day | INTRAVENOUS | Status: AC | PRN
  Administered 2017-05-28: 500 [IU]
  Filled 2017-05-28: qty 5

## 2017-05-29 LAB — BPAM PLATELET PHERESIS
Blood Product Expiration Date: 201903191430
ISSUE DATE / TIME: 201903191407
UNIT TYPE AND RH: 5100

## 2017-05-29 LAB — PREPARE PLATELET PHERESIS: Unit division: 0

## 2017-05-30 ENCOUNTER — Telehealth: Payer: Self-pay | Admitting: *Deleted

## 2017-05-30 ENCOUNTER — Inpatient Hospital Stay: Payer: Medicare Other

## 2017-05-30 DIAGNOSIS — Z170 Estrogen receptor positive status [ER+]: Secondary | ICD-10-CM | POA: Diagnosis not present

## 2017-05-30 DIAGNOSIS — Z79811 Long term (current) use of aromatase inhibitors: Secondary | ICD-10-CM | POA: Diagnosis not present

## 2017-05-30 DIAGNOSIS — D469 Myelodysplastic syndrome, unspecified: Secondary | ICD-10-CM

## 2017-05-30 DIAGNOSIS — C50811 Malignant neoplasm of overlapping sites of right female breast: Secondary | ICD-10-CM | POA: Diagnosis not present

## 2017-05-30 DIAGNOSIS — C787 Secondary malignant neoplasm of liver and intrahepatic bile duct: Secondary | ICD-10-CM | POA: Diagnosis not present

## 2017-05-30 DIAGNOSIS — C7801 Secondary malignant neoplasm of right lung: Secondary | ICD-10-CM | POA: Diagnosis not present

## 2017-05-30 LAB — SAMPLE TO BLOOD BANK

## 2017-05-30 LAB — CBC WITH DIFFERENTIAL/PLATELET
BASOS ABS: 0 10*3/uL (ref 0–0.1)
BASOS PCT: 0 %
EOS PCT: 0 %
Eosinophils Absolute: 0 10*3/uL (ref 0–0.7)
HCT: 24.4 % — ABNORMAL LOW (ref 35.0–47.0)
Hemoglobin: 8.4 g/dL — ABNORMAL LOW (ref 12.0–16.0)
LYMPHS PCT: 59 %
Lymphs Abs: 1.5 10*3/uL (ref 1.0–3.6)
MCH: 30.1 pg (ref 26.0–34.0)
MCHC: 34.6 g/dL (ref 32.0–36.0)
MCV: 87.1 fL (ref 80.0–100.0)
Monocytes Absolute: 0.1 10*3/uL — ABNORMAL LOW (ref 0.2–0.9)
Monocytes Relative: 5 %
Neutro Abs: 0.9 10*3/uL — ABNORMAL LOW (ref 1.4–6.5)
Neutrophils Relative %: 36 %
PLATELETS: 46 10*3/uL — AB (ref 150–440)
RBC: 2.8 MIL/uL — AB (ref 3.80–5.20)
RDW: 14.8 % — ABNORMAL HIGH (ref 11.5–14.5)
WBC: 2.5 10*3/uL — AB (ref 3.6–11.0)

## 2017-05-30 NOTE — Telephone Encounter (Signed)
Patient called asking if she needs to keep her transfusion appointment tomorrow or not. Please advise

## 2017-05-30 NOTE — Telephone Encounter (Signed)
Per Sonia Baller, NP- no blood or plts. Keep standing apts

## 2017-05-30 NOTE — Telephone Encounter (Addendum)
Discussed with NP Zenia Resides. Patient informed no need for transfusions

## 2017-05-31 ENCOUNTER — Inpatient Hospital Stay: Payer: Medicare Other

## 2017-05-31 ENCOUNTER — Other Ambulatory Visit: Payer: Self-pay | Admitting: *Deleted

## 2017-06-03 ENCOUNTER — Telehealth: Payer: Self-pay | Admitting: *Deleted

## 2017-06-03 ENCOUNTER — Inpatient Hospital Stay: Payer: Medicare Other

## 2017-06-03 ENCOUNTER — Encounter
Admission: RE | Admit: 2017-06-03 | Discharge: 2017-06-03 | Disposition: A | Discharge status: Home / Self Care | Payer: Medicare Other | Source: Ambulatory Visit | Attending: Internal Medicine | Admitting: Internal Medicine

## 2017-06-03 DIAGNOSIS — C787 Secondary malignant neoplasm of liver and intrahepatic bile duct: Secondary | ICD-10-CM | POA: Diagnosis not present

## 2017-06-03 DIAGNOSIS — C50811 Malignant neoplasm of overlapping sites of right female breast: Secondary | ICD-10-CM | POA: Diagnosis not present

## 2017-06-03 DIAGNOSIS — Z170 Estrogen receptor positive status [ER+]: Secondary | ICD-10-CM | POA: Insufficient documentation

## 2017-06-03 DIAGNOSIS — Z79811 Long term (current) use of aromatase inhibitors: Secondary | ICD-10-CM | POA: Diagnosis not present

## 2017-06-03 DIAGNOSIS — D469 Myelodysplastic syndrome, unspecified: Secondary | ICD-10-CM

## 2017-06-03 DIAGNOSIS — C50911 Malignant neoplasm of unspecified site of right female breast: Secondary | ICD-10-CM | POA: Diagnosis not present

## 2017-06-03 DIAGNOSIS — C7801 Secondary malignant neoplasm of right lung: Secondary | ICD-10-CM | POA: Diagnosis not present

## 2017-06-03 LAB — CBC WITH DIFFERENTIAL/PLATELET
BASOS PCT: 0 %
Basophils Absolute: 0 10*3/uL (ref 0–0.1)
Eosinophils Absolute: 0 10*3/uL (ref 0–0.7)
Eosinophils Relative: 0 %
HEMATOCRIT: 22.8 % — AB (ref 35.0–47.0)
HEMOGLOBIN: 8 g/dL — AB (ref 12.0–16.0)
LYMPHS ABS: 1.2 10*3/uL (ref 1.0–3.6)
LYMPHS PCT: 53 %
MCH: 30.2 pg (ref 26.0–34.0)
MCHC: 35 g/dL (ref 32.0–36.0)
MCV: 86.3 fL (ref 80.0–100.0)
MONO ABS: 0.1 10*3/uL — AB (ref 0.2–0.9)
Monocytes Relative: 4 %
NEUTROS ABS: 1 10*3/uL — AB (ref 1.4–6.5)
NEUTROS PCT: 43 %
Platelets: 14 10*3/uL — CL (ref 150–440)
RBC: 2.64 MIL/uL — ABNORMAL LOW (ref 3.80–5.20)
RDW: 15 % — AB (ref 11.5–14.5)
WBC: 2.4 10*3/uL — ABNORMAL LOW (ref 3.6–11.0)

## 2017-06-03 LAB — GLUCOSE, CAPILLARY: Glucose-Capillary: 56 mg/dL — ABNORMAL LOW (ref 65–99)

## 2017-06-03 LAB — SAMPLE TO BLOOD BANK

## 2017-06-03 MED ORDER — FLUDEOXYGLUCOSE F - 18 (FDG) INJECTION
7.2000 | Freq: Once | INTRAVENOUS | Status: AC | PRN
  Administered 2017-06-03: 7.84 via INTRAVENOUS

## 2017-06-03 NOTE — Telephone Encounter (Signed)
Per Dr. Rogue Bussing - plts are 14 today- critical, but stable per Dr. Rogue Bussing. hgb is 8.0. Patient declines blood transfusion today.  Per md, does not need plt infusion tomorrow.  Pt contacted. Will keep her schedule apts on Thursday for lab draws. Pt gave verbal understanding of the plan of care.

## 2017-06-04 ENCOUNTER — Inpatient Hospital Stay: Payer: Medicare Other

## 2017-06-05 ENCOUNTER — Telehealth: Payer: Self-pay | Admitting: Internal Medicine

## 2017-06-05 NOTE — Telephone Encounter (Signed)
Spoke to pt re: results of PET- looks good/ no evidence of metastatic breast cancer.   Heather-please check if patient can have blood- 1 unit tomorrow [as her hemoglobin was 8/she feels tired; she is awaiting blood work tomorrow].  Thanks!

## 2017-06-05 NOTE — Telephone Encounter (Signed)
Pt made aware - that unit of blood can be given on Friday. 4/29.

## 2017-06-06 ENCOUNTER — Inpatient Hospital Stay: Payer: Medicare Other | Admitting: *Deleted

## 2017-06-06 ENCOUNTER — Other Ambulatory Visit: Payer: Self-pay

## 2017-06-06 ENCOUNTER — Other Ambulatory Visit: Payer: Self-pay | Admitting: *Deleted

## 2017-06-06 DIAGNOSIS — C7801 Secondary malignant neoplasm of right lung: Secondary | ICD-10-CM | POA: Diagnosis not present

## 2017-06-06 DIAGNOSIS — D649 Anemia, unspecified: Secondary | ICD-10-CM

## 2017-06-06 DIAGNOSIS — Z170 Estrogen receptor positive status [ER+]: Secondary | ICD-10-CM | POA: Diagnosis not present

## 2017-06-06 DIAGNOSIS — D696 Thrombocytopenia, unspecified: Secondary | ICD-10-CM

## 2017-06-06 DIAGNOSIS — Z95828 Presence of other vascular implants and grafts: Secondary | ICD-10-CM

## 2017-06-06 DIAGNOSIS — Z79811 Long term (current) use of aromatase inhibitors: Secondary | ICD-10-CM | POA: Diagnosis not present

## 2017-06-06 DIAGNOSIS — C787 Secondary malignant neoplasm of liver and intrahepatic bile duct: Secondary | ICD-10-CM | POA: Diagnosis not present

## 2017-06-06 DIAGNOSIS — D469 Myelodysplastic syndrome, unspecified: Secondary | ICD-10-CM | POA: Diagnosis not present

## 2017-06-06 DIAGNOSIS — C50811 Malignant neoplasm of overlapping sites of right female breast: Secondary | ICD-10-CM | POA: Diagnosis not present

## 2017-06-06 LAB — CBC WITH DIFFERENTIAL/PLATELET
BASOS PCT: 0 %
Basophils Absolute: 0 10*3/uL (ref 0–0.1)
Eosinophils Absolute: 0 10*3/uL (ref 0–0.7)
Eosinophils Relative: 0 %
HEMATOCRIT: 21.3 % — AB (ref 35.0–47.0)
HEMOGLOBIN: 7.6 g/dL — AB (ref 12.0–16.0)
Lymphocytes Relative: 63 %
Lymphs Abs: 1.5 10*3/uL (ref 1.0–3.6)
MCH: 30.6 pg (ref 26.0–34.0)
MCHC: 35.8 g/dL (ref 32.0–36.0)
MCV: 85.5 fL (ref 80.0–100.0)
MONOS PCT: 4 %
Monocytes Absolute: 0.1 10*3/uL — ABNORMAL LOW (ref 0.2–0.9)
NEUTROS ABS: 0.8 10*3/uL — AB (ref 1.4–6.5)
NEUTROS PCT: 33 %
Platelets: <5 10*3/uL — CL (ref 150–440)
RBC: 2.49 MIL/uL — ABNORMAL LOW (ref 3.80–5.20)
RDW: 14.9 % — ABNORMAL HIGH (ref 11.5–14.5)
WBC: 2.3 10*3/uL — ABNORMAL LOW (ref 3.6–11.0)

## 2017-06-06 LAB — SAMPLE TO BLOOD BANK

## 2017-06-06 LAB — PREPARE RBC (CROSSMATCH)

## 2017-06-06 MED ORDER — HEPARIN SOD (PORK) LOCK FLUSH 100 UNIT/ML IV SOLN
500.0000 [IU] | INTRAVENOUS | Status: AC | PRN
  Administered 2017-06-06: 500 [IU]

## 2017-06-06 MED ORDER — SODIUM CHLORIDE 0.9% FLUSH
10.0000 mL | INTRAVENOUS | Status: AC | PRN
  Administered 2017-06-06: 10 mL
  Filled 2017-06-06: qty 10

## 2017-06-07 ENCOUNTER — Inpatient Hospital Stay: Payer: Medicare Other

## 2017-06-07 DIAGNOSIS — D696 Thrombocytopenia, unspecified: Secondary | ICD-10-CM

## 2017-06-07 DIAGNOSIS — D469 Myelodysplastic syndrome, unspecified: Secondary | ICD-10-CM | POA: Diagnosis not present

## 2017-06-07 DIAGNOSIS — Z79811 Long term (current) use of aromatase inhibitors: Secondary | ICD-10-CM | POA: Diagnosis not present

## 2017-06-07 DIAGNOSIS — C7801 Secondary malignant neoplasm of right lung: Secondary | ICD-10-CM | POA: Diagnosis not present

## 2017-06-07 DIAGNOSIS — D649 Anemia, unspecified: Secondary | ICD-10-CM

## 2017-06-07 DIAGNOSIS — Z170 Estrogen receptor positive status [ER+]: Secondary | ICD-10-CM | POA: Diagnosis not present

## 2017-06-07 DIAGNOSIS — C787 Secondary malignant neoplasm of liver and intrahepatic bile duct: Secondary | ICD-10-CM | POA: Diagnosis not present

## 2017-06-07 DIAGNOSIS — C50811 Malignant neoplasm of overlapping sites of right female breast: Secondary | ICD-10-CM | POA: Diagnosis not present

## 2017-06-07 MED ORDER — HEPARIN SOD (PORK) LOCK FLUSH 100 UNIT/ML IV SOLN
500.0000 [IU] | Freq: Every day | INTRAVENOUS | Status: AC | PRN
  Administered 2017-06-07: 500 [IU]
  Filled 2017-06-07: qty 5

## 2017-06-07 MED ORDER — SODIUM CHLORIDE 0.9 % IV SOLN
250.0000 mL | Freq: Once | INTRAVENOUS | Status: AC
  Administered 2017-06-07: 250 mL via INTRAVENOUS
  Filled 2017-06-07: qty 250

## 2017-06-07 MED ORDER — METHYLPREDNISOLONE SODIUM SUCC 125 MG IJ SOLR
40.0000 mg | Freq: Once | INTRAMUSCULAR | Status: AC
  Administered 2017-06-07: 40 mg via INTRAVENOUS
  Filled 2017-06-07: qty 2

## 2017-06-07 MED ORDER — DIPHENHYDRAMINE HCL 25 MG PO CAPS
25.0000 mg | ORAL_CAPSULE | Freq: Once | ORAL | Status: AC
  Administered 2017-06-07: 25 mg via ORAL
  Filled 2017-06-07: qty 1

## 2017-06-07 MED ORDER — ACETAMINOPHEN 325 MG PO TABS
650.0000 mg | ORAL_TABLET | Freq: Once | ORAL | Status: AC
  Administered 2017-06-07: 650 mg via ORAL
  Filled 2017-06-07: qty 2

## 2017-06-07 MED ORDER — SODIUM CHLORIDE 0.9% FLUSH
10.0000 mL | INTRAVENOUS | Status: AC | PRN
  Administered 2017-06-07: 10 mL
  Filled 2017-06-07: qty 10

## 2017-06-08 LAB — PREPARE PLATELET PHERESIS: Unit division: 0

## 2017-06-08 LAB — BPAM PLATELET PHERESIS
Blood Product Expiration Date: 201903291310
ISSUE DATE / TIME: 201903291225
Unit Type and Rh: 5100

## 2017-06-09 LAB — TYPE AND SCREEN
ABO/RH(D): O POS
Antibody Screen: POSITIVE
Donor AG Type: NEGATIVE
UNIT DIVISION: 0

## 2017-06-09 LAB — BPAM RBC
BLOOD PRODUCT EXPIRATION DATE: 201904212359
ISSUE DATE / TIME: 201903291318
UNIT TYPE AND RH: 5100

## 2017-06-10 ENCOUNTER — Encounter: Payer: Self-pay | Admitting: Internal Medicine

## 2017-06-10 ENCOUNTER — Inpatient Hospital Stay: Payer: Medicare Other | Attending: Internal Medicine

## 2017-06-10 ENCOUNTER — Other Ambulatory Visit: Payer: Self-pay

## 2017-06-10 ENCOUNTER — Inpatient Hospital Stay (HOSPITAL_BASED_OUTPATIENT_CLINIC_OR_DEPARTMENT_OTHER): Payer: Medicare Other | Admitting: Internal Medicine

## 2017-06-10 ENCOUNTER — Other Ambulatory Visit: Payer: Self-pay | Admitting: Internal Medicine

## 2017-06-10 ENCOUNTER — Telehealth: Payer: Self-pay | Admitting: Internal Medicine

## 2017-06-10 ENCOUNTER — Inpatient Hospital Stay: Payer: Medicare Other

## 2017-06-10 VITALS — BP 132/96 | HR 89 | Temp 97.9°F | Resp 20

## 2017-06-10 DIAGNOSIS — C50911 Malignant neoplasm of unspecified site of right female breast: Secondary | ICD-10-CM | POA: Insufficient documentation

## 2017-06-10 DIAGNOSIS — M810 Age-related osteoporosis without current pathological fracture: Secondary | ICD-10-CM | POA: Insufficient documentation

## 2017-06-10 DIAGNOSIS — F17210 Nicotine dependence, cigarettes, uncomplicated: Secondary | ICD-10-CM | POA: Diagnosis not present

## 2017-06-10 DIAGNOSIS — D696 Thrombocytopenia, unspecified: Secondary | ICD-10-CM

## 2017-06-10 DIAGNOSIS — J449 Chronic obstructive pulmonary disease, unspecified: Secondary | ICD-10-CM

## 2017-06-10 DIAGNOSIS — I10 Essential (primary) hypertension: Secondary | ICD-10-CM

## 2017-06-10 DIAGNOSIS — Z9221 Personal history of antineoplastic chemotherapy: Secondary | ICD-10-CM | POA: Insufficient documentation

## 2017-06-10 DIAGNOSIS — Z79811 Long term (current) use of aromatase inhibitors: Secondary | ICD-10-CM | POA: Insufficient documentation

## 2017-06-10 DIAGNOSIS — M1990 Unspecified osteoarthritis, unspecified site: Secondary | ICD-10-CM

## 2017-06-10 DIAGNOSIS — Z923 Personal history of irradiation: Secondary | ICD-10-CM | POA: Diagnosis not present

## 2017-06-10 DIAGNOSIS — K219 Gastro-esophageal reflux disease without esophagitis: Secondary | ICD-10-CM | POA: Insufficient documentation

## 2017-06-10 DIAGNOSIS — R509 Fever, unspecified: Secondary | ICD-10-CM | POA: Diagnosis not present

## 2017-06-10 DIAGNOSIS — C7801 Secondary malignant neoplasm of right lung: Secondary | ICD-10-CM | POA: Insufficient documentation

## 2017-06-10 DIAGNOSIS — J029 Acute pharyngitis, unspecified: Secondary | ICD-10-CM | POA: Insufficient documentation

## 2017-06-10 DIAGNOSIS — D469 Myelodysplastic syndrome, unspecified: Secondary | ICD-10-CM | POA: Diagnosis not present

## 2017-06-10 DIAGNOSIS — Z170 Estrogen receptor positive status [ER+]: Secondary | ICD-10-CM | POA: Diagnosis not present

## 2017-06-10 DIAGNOSIS — H9201 Otalgia, right ear: Secondary | ICD-10-CM | POA: Insufficient documentation

## 2017-06-10 DIAGNOSIS — Z5111 Encounter for antineoplastic chemotherapy: Secondary | ICD-10-CM | POA: Diagnosis not present

## 2017-06-10 DIAGNOSIS — Z803 Family history of malignant neoplasm of breast: Secondary | ICD-10-CM | POA: Insufficient documentation

## 2017-06-10 DIAGNOSIS — Z8614 Personal history of Methicillin resistant Staphylococcus aureus infection: Secondary | ICD-10-CM | POA: Insufficient documentation

## 2017-06-10 DIAGNOSIS — D649 Anemia, unspecified: Secondary | ICD-10-CM | POA: Diagnosis not present

## 2017-06-10 DIAGNOSIS — H00014 Hordeolum externum left upper eyelid: Secondary | ICD-10-CM | POA: Insufficient documentation

## 2017-06-10 LAB — COMPREHENSIVE METABOLIC PANEL
ALBUMIN: 3.5 g/dL (ref 3.5–5.0)
ALK PHOS: 71 U/L (ref 38–126)
ALT: 17 U/L (ref 14–54)
AST: 18 U/L (ref 15–41)
Anion gap: 7 (ref 5–15)
BILIRUBIN TOTAL: 0.7 mg/dL (ref 0.3–1.2)
BUN: 14 mg/dL (ref 6–20)
CALCIUM: 9.1 mg/dL (ref 8.9–10.3)
CO2: 26 mmol/L (ref 22–32)
Chloride: 102 mmol/L (ref 101–111)
Creatinine, Ser: 0.7 mg/dL (ref 0.44–1.00)
GFR calc Af Amer: >60 mL/min (ref >60)
GFR calc non Af Amer: >60 mL/min (ref >60)
GLUCOSE: 116 mg/dL — AB (ref 65–99)
POTASSIUM: 4.3 mmol/L (ref 3.5–5.1)
SODIUM: 135 mmol/L (ref 135–145)
TOTAL PROTEIN: 6.2 g/dL — AB (ref 6.5–8.1)

## 2017-06-10 LAB — CBC WITH DIFFERENTIAL/PLATELET
BASOS ABS: 0 10*3/uL (ref 0–0.1)
Basophils Relative: 0 %
EOS PCT: 1 %
Eosinophils Absolute: 0 10*3/uL (ref 0–0.7)
HEMATOCRIT: 25.6 % — AB (ref 35.0–47.0)
Hemoglobin: 9.1 g/dL — ABNORMAL LOW (ref 12.0–16.0)
LYMPHS PCT: 58 %
Lymphs Abs: 1.1 10*3/uL (ref 1.0–3.6)
MCH: 30.1 pg (ref 26.0–34.0)
MCHC: 35.3 g/dL (ref 32.0–36.0)
MCV: 85.1 fL (ref 80.0–100.0)
MONO ABS: 0.1 10*3/uL — AB (ref 0.2–0.9)
MONOS PCT: 5 %
NEUTROS ABS: 0.7 10*3/uL — AB (ref 1.4–6.5)
Neutrophils Relative %: 36 %
PLATELETS: 14 10*3/uL — AB (ref 150–400)
RBC: 3.01 MIL/uL — ABNORMAL LOW (ref 3.80–5.20)
RDW: 14.3 % (ref 11.5–14.5)
WBC: 1.9 10*3/uL — ABNORMAL LOW (ref 3.6–11.0)

## 2017-06-10 LAB — SAMPLE TO BLOOD BANK

## 2017-06-10 MED ORDER — SODIUM CHLORIDE 0.9 % IV SOLN
Freq: Once | INTRAVENOUS | Status: AC
  Administered 2017-06-10: 12:00:00 via INTRAVENOUS
  Filled 2017-06-10: qty 1000

## 2017-06-10 MED ORDER — SODIUM CHLORIDE 0.9% FLUSH
10.0000 mL | INTRAVENOUS | Status: DC | PRN
  Filled 2017-06-10: qty 10

## 2017-06-10 MED ORDER — HEPARIN SOD (PORK) LOCK FLUSH 100 UNIT/ML IV SOLN
500.0000 [IU] | Freq: Once | INTRAVENOUS | Status: AC
  Administered 2017-06-10: 500 [IU] via INTRAVENOUS

## 2017-06-10 MED ORDER — PROCHLORPERAZINE MALEATE 10 MG PO TABS
10.0000 mg | ORAL_TABLET | Freq: Once | ORAL | Status: AC
  Administered 2017-06-10: 10 mg via ORAL
  Filled 2017-06-10: qty 1

## 2017-06-10 MED ORDER — SODIUM CHLORIDE 0.9 % IV SOLN
20.0000 mg/m2 | Freq: Once | INTRAVENOUS | Status: AC
  Administered 2017-06-10: 35 mg via INTRAVENOUS
  Filled 2017-06-10: qty 7

## 2017-06-10 NOTE — Progress Notes (Signed)
Proceed with Dacogen today per Dr. Rogue Bussing.

## 2017-06-10 NOTE — Progress Notes (Signed)
1043 am - Per Lonnie in lab - called Critical platelet count of 14; hgb=9.1. Read back process performed.  Discussed bleeding precautions & risk for infection for the patient as pt is neutropenic. Patient very anxious about her lab values today and "worried that she will not be treated with Dacogen." pt reports intermittent right shoulder discomfort. H/o shoulder cuff surgery. Patient states that her shoulder has been "making clicking like sounds in the joint." pt taking extra strength tylenol as needed for arthritic discomfort in her shoulder. Pt educated to take her temperature before taking tylenol as the tylenol may mask fevers.

## 2017-06-10 NOTE — Assessment & Plan Note (Addendum)
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   # Given the clinical suspicion of MDS-recommend treatment with Dacogen.  I again discussed the schedule; possible side effects including drop in blood counts/risk of infections diarrhea etc. she understands treatments are palliative.  #Proceed with Dacogen 20 mg/m square day 1 through 5 every 4 weeks; cycle #1 today.  #Today platelets are 14; hemoglobin 9.2; white count 1.9 ANC 0.9-proceed with platelet transfusion tomorrow/washed-crossmatch tomorrow.  # right shoulder pain ? Arthritis-no obvious evidence of malignancy noted on the PET scan.  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 On Femara. PET- significant response noted.   # mon/thursday-lab possible platelets.  Plan platelet transfusion tomorrow.  # follow up in 2 weeks/MD.

## 2017-06-10 NOTE — Progress Notes (Signed)
[facility] [place] OFFICE PROGRESS NOTE  Patient Care Team: [name], MD as PCP - General (Family Medicine) [name], DPM as Consulting Physician (Podiatry) [name], MD as Consulting Physician (Internal Medicine) Robert Bellow, MD (General Surgery)   SUMMARY OF ONCOLOGIC HISTORY:  Oncology History   # April 2018- Left chest wall Bx- IMC; ER-PR-POS; her 2 Neu NEG; PET- mild hilar/distal adenopathy; ~1 cm right lung nodule/ several sub-centimeter.   # May 2018- cont [facility]; Abemacliclib [on HOLD sec to cytopenia]; MARCH 2019- PET near CR from breast cancer  # April 2017-isolated Thrombocytopenia- platelets- 113; worsening PANCYTOPENIA- April 2018- BMBx- no evidence of malignancy; MDS/Acute leuk; Karyotype/MDS- FISH panel-NEG [d/w Dr.Smir];   # June 2018- REPEAT BMBx/[facility] [June 2018-Dr.Foster; Aug 2018- Dr.Powell at [facility]]; No Diagnosis.   # April 11, 2017 [third bone marrow]-is still inconclusive; no obvious evidence of acute leukemia or obvious MDS except for mild dysplastic changes; and significantly reduced megakaryocytes. 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 [reviwed at [facility]; Foundation One-NEG]  # April 1st 2019- Dacogen   # May 29th 2018- N-plate- suboptimal improvement; NOV 1st week start promacta 50 mg/day; suboptimal response; DEC 1st- start promacta 75 mg/day  # DEC 18th-INCREASE PROMACTA to 129m/day  # 2006- RIGHT BREAST CA [pT1cN0M0; STAGE I; ER/PRPos; Her 2 Neu-NEG] s/p FEC x 6 [NSABP B-36]; Femara [Dec 2007-2012]  # Osteoporosis s/p Reclast; BMD- 2015-wnl- ca+vit D   Dr.Stewart [derm ? Squamous cell s/p freeze-oct 2018]        Carcinoma of overlapping sites of right breast in female, estrogen receptor positive ([place]    MDS (myelodysplastic syndrome) ([place]     INTERVAL HISTORY:  A very pleasant 72year old female patient  metastatic /recurrent breast cancer- ER/PR positive HER-2/neu negative currently on Femara; And pancytopenia/ with more severe thrombocytopenia-likely secondary MDS is here for follow-up/also here to review the results of the recent PET scan.  Patient complains of right shoulder pain especially with movement of the last few weeks.  She denies any recent trauma. Prior history of dislocation of the shoulde in the 342s   Patient has been needing platelet transfusion about once a week; and PRBC transfusion every 3-4 weeks.  Otherwise denies any blood in stools or black colored stools.  She continues to take Femara; no body aches or joint pains.  Denies any headaches.  No new lumps or bumps.  REVIEW OF SYSTEMS:  A complete 10 point review of system is done which is negative except mentioned above/history of present illness.   PAST MEDICAL HISTORY :  Past Medical History:  Diagnosis Date  . Blood dyscrasia   . Breast cancer ([facility]    right, lumpectomy, radiation, chemo  . Breast cancer ([facility]    Right, 2007  . Breast cancer ([place] 06/20/2016   INVASIVE DUCTAL CARCINOMA.  . Collapsed lung 02/14/2017   RIGHT  . Complication of anesthesia    PT STATED AFTER BIL HIP SURGERIES SHE STOPPED BREATHING THE NIGHT AFTER SURGERY  . COPD (chronic obstructive pulmonary disease) ([place]   . GERD (gastroesophageal reflux disease)   . Headache   . History of chemotherapy   . History of methicillin resistant staphylococcus aureus (MRSA) 2011  . History of radiation therapy   . Hyperlipidemia   . Hypertension   . Liver cancer ([facility] 05/2016  . Lung cancer ([facility] 05/2016  . Osteoarthritis    right hip  .  Osteoporosis   . Squamous cell carcinoma    leg, Followed by Dr. Nicole Kindred    PAST SURGICAL HISTORY :   Past Surgical History:  Procedure Laterality Date  . ABDOMINAL HYSTERECTOMY    . BREAST BIOPSY Left 2002   left breast, calcifications  . BREAST BIOPSY Left 06/20/2016   INVASIVE DUCTAL CARCINOMA.  Marland Kitchen  BREAST EXCISIONAL BIOPSY Right 2007   positive  . bunion repair    . ESOPHAGOGASTRODUODENOSCOPY (EGD) WITH PROPOFOL N/A 08/05/2016   Procedure: ESOPHAGOGASTRODUODENOSCOPY (EGD) WITH PROPOFOL;  Surgeon: San Jetty, MD;  Location: [facility] ENDOSCOPY;  Service: General;  Laterality: N/A;  . JOINT REPLACEMENT    . left breast biopsy    . NASAL SINUS SURGERY    . PORTACATH PLACEMENT Right 04/13/2017   Procedure: INSERTION PORT-A-CATH- RIGHT INTERNAL JUGULAR;  Surgeon: Robert Bellow, MD;  Location: [facility] ORS;  Service: General;  Laterality: Right;  . right hip replacement    . SHOULDER SURGERY    . SQUAMOUS CELL CARCINOMA EXCISION     right leg, Dr. Nicole Kindred  . TOTAL HIP ARTHROPLASTY     right    FAMILY HISTORY :   Family History  Problem Relation Age of Onset  . Heart disease Father 9  . Heart disease Mother   . Hyperlipidemia Mother   . Heart disease Brother   . Diabetes Brother   . Diabetes Maternal Grandmother   . Breast cancer Cousin   . Kidney disease Brother     SOCIAL HISTORY:   Social History   Tobacco Use  . Smoking status: Current Some Day Smoker    Packs/day: 0.10    Years: 30.00    Pack years: 3.00    Types: Cigarettes  . Smokeless tobacco: Never Used  Substance Use Topics  . Alcohol use: Yes    Alcohol/week: 0.0 oz    Comment: socially  . Drug use: No    ALLERGIES:  is allergic to codeine; fish-derived products; morphine sulfate; adhesive [tape]; and oxycodone.  MEDICATIONS:  Current Outpatient Medications  Medication Sig Dispense Refill  . ALPRAZolam (XANAX) 0.5 MG tablet TAKE 1 TABLET BY MOUTH EVERY 8 HOURS AS NEEDED FOR ANXIETY OR SLEEP (Patient taking differently: take 1 tablet by mouth at bedtime) 30 tablet 2  . cyanocobalamin (,VITAMIN B-12,) 1000 MCG/ML injection Inject 1,000 mcg into the muscle every 30 (thirty) days.    . ergocalciferol (VITAMIN D2) 50000 units capsule Take 1 capsule (50,000 Units total) by mouth every Saturday. In the morning.  12 capsule 0  . fexofenadine (ALLEGRA) 180 MG tablet Take 180 mg by mouth every morning.     . Glucosamine-Chondroitin (COSAMIN DS PO) Take 1 tablet by mouth daily. In the morning.    Marland Kitchen letrozole (FEMARA) 2.5 MG tablet Take 1 tablet (2.5 mg total) by mouth daily. Once a day. 90 tablet 3  . lidocaine-prilocaine (EMLA) cream Apply 1 application topically as needed. Apply generously over the Mediport 45 minutes prior to chemotherapy. 30 g 0  . metoprolol succinate (TOPROL-XL) 25 MG 24 hr tablet Take 1 tablet (25 mg total) by mouth daily. 90 tablet 3  . neomycin-polymyxin-pramoxine (NEOSPORIN PLUS) 1 % cream Apply 1 application topically 2 (two) times daily as needed (Nasal passages as needed for dryness.).    Marland Kitchen pravastatin (PRAVACHOL) 40 MG tablet Take 1 tablet (40 mg total) by mouth daily. 90 tablet 3  . Probiotic Product (PROBIOTIC & ACIDOPHILUS EX ST PO) Take 1 tablet by mouth daily.     Marland Kitchen  ranitidine (ZANTAC) 75 MG tablet Take 75 mg by mouth as needed.     . vitamin B-12 (CYANOCOBALAMIN) 1000 MCG tablet Take 1,000 mcg by mouth daily.      No current facility-administered medications for this visit.    Facility-Administered Medications Ordered in Other Visits  Medication Dose Route Frequency Provider Last Rate Last Dose  . sodium chloride flush (NS) 0.9 % injection 10 mL  10 mL Intravenous PRN [name], MD        PHYSICAL EXAMINATION: ECOG PERFORMANCE STATUS: 0 - Asymptomatic  BP (!) 132/96   Pulse 89   Temp 97.9 F (36.6 C) (Tympanic)   Resp 20   There were no vitals filed for this visit.  GENERAL: Well-nourished well-developed; Alert, no distress and comfortable.  She is alone. EYES: WNL.  OROPHARYNX: no thrush or ulceration; good dentition. No bleeding noted. NECK: supple, no masses felt LYMPH:  no palpable lymphadenopathy in the cervical, axillary or inguinal regions LUNGS: Decreased breath sounds to auscultation bilaterally and  No wheeze or crackles HEART/CVS:  regular rate & rhythm and no murmurs; No lower extremity edema ABDOMEN:abdomen soft, non-tender and normal bowel sounds Musculoskeletal:no cyanosis of digits and no clubbing  PSYCH: alert & oriented x 3 with fluent speech NEURO: no focal motor/sensory deficits SKIN: Mild chronic ecchymosis.  Not any worse.   LABORATORY DATA:  I have reviewed the data as listed    Component Value Date/Time   NA 135 06/10/2017 1028   K 4.3 06/10/2017 1028   CL 102 06/10/2017 1028   CO2 26 06/10/2017 1028   GLUCOSE 116 (H) 06/10/2017 1028   BUN 14 06/10/2017 1028   CREATININE 0.70 06/10/2017 1028   CREATININE 0.67 07/09/2014 1321   CREATININE 0.70 04/30/2014 1455   CALCIUM 9.1 06/10/2017 1028   PROT 6.2 (L) 06/10/2017 1028   PROT 7.3 07/09/2014 1321   ALBUMIN 3.5 06/10/2017 1028   ALBUMIN 4.3 07/09/2014 1321   AST 18 06/10/2017 1028   AST 19 07/09/2014 1321   ALT 17 06/10/2017 1028   ALT 18 07/09/2014 1321   ALKPHOS 71 06/10/2017 1028   ALKPHOS 66 07/09/2014 1321   BILITOT 0.7 06/10/2017 1028   BILITOT 0.6 07/09/2014 1321   GFRNONAA >60 06/10/2017 1028   GFRNONAA >60 07/09/2014 1321   GFRAA >60 06/10/2017 1028   GFRAA >60 07/09/2014 1321    No results found for: SPEP, UPEP  Lab Results  Component Value Date   WBC 1.9 (L) 06/10/2017   NEUTROABS 0.7 (L) 06/10/2017   HGB 9.1 (L) 06/10/2017   HCT 25.6 (L) 06/10/2017   MCV 85.1 06/10/2017   PLT 14 (LL) 06/10/2017      Chemistry      Component Value Date/Time   NA 135 06/10/2017 1028   K 4.3 06/10/2017 1028   CL 102 06/10/2017 1028   CO2 26 06/10/2017 1028   BUN 14 06/10/2017 1028   CREATININE 0.70 06/10/2017 1028   CREATININE 0.67 07/09/2014 1321   CREATININE 0.70 04/30/2014 1455      Component Value Date/Time   CALCIUM 9.1 06/10/2017 1028   ALKPHOS 71 06/10/2017 1028   ALKPHOS 66 07/09/2014 1321   AST 18 06/10/2017 1028   AST 19 07/09/2014 1321   ALT 17 06/10/2017 1028   ALT 18 07/09/2014 1321   BILITOT 0.7 06/10/2017  1028   BILITOT 0.6 07/09/2014 1321        ASSESSMENT & PLAN:   MDS (myelodysplastic syndrome) ([place]) #  Severe thrombocytopenia//anemia- highly suspicious for MDS;/Katotype- slightly abnormal- trisomy 18 in 2 out of 17 metaphase cells.   # Given the clinical suspicion of MDS-recommend treatment with Dacogen.  I again discussed the schedule; possible side effects including drop in blood counts/risk of infections diarrhea etc. she understands treatments are palliative.  #Proceed with Dacogen 20 mg/m square day 1 through 5 every 4 weeks; cycle #1 today.  #Today platelets are 14; hemoglobin 9.2; white count 1.9 ANC 0.9-proceed with platelet transfusion tomorrow/washed-crossmatch tomorrow.  # right shoulder pain ? Arthritis-no obvious evidence of malignancy noted on the PET scan.  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 On Femara. PET- significant response noted.   # mon/thursday-lab possible platelets.  Plan platelet transfusion tomorrow.  # follow up in 2 weeks/MD.      [name], MD 06/10/2017 1:35 PM

## 2017-06-10 NOTE — Telephone Encounter (Signed)
Please take patient off Dacogen schedule when the patient sees me in 2 weeks; keep the rest of the appointments. Wilson Singer is given every 4 weeks]

## 2017-06-11 ENCOUNTER — Inpatient Hospital Stay: Payer: Medicare Other

## 2017-06-11 VITALS — BP 138/81 | HR 83 | Temp 96.3°F | Resp 18

## 2017-06-11 DIAGNOSIS — Z170 Estrogen receptor positive status [ER+]: Secondary | ICD-10-CM | POA: Diagnosis not present

## 2017-06-11 DIAGNOSIS — C7801 Secondary malignant neoplasm of right lung: Secondary | ICD-10-CM | POA: Diagnosis not present

## 2017-06-11 DIAGNOSIS — Z79811 Long term (current) use of aromatase inhibitors: Secondary | ICD-10-CM | POA: Diagnosis not present

## 2017-06-11 DIAGNOSIS — D469 Myelodysplastic syndrome, unspecified: Secondary | ICD-10-CM | POA: Diagnosis not present

## 2017-06-11 DIAGNOSIS — D696 Thrombocytopenia, unspecified: Secondary | ICD-10-CM

## 2017-06-11 DIAGNOSIS — Z5111 Encounter for antineoplastic chemotherapy: Secondary | ICD-10-CM | POA: Diagnosis not present

## 2017-06-11 DIAGNOSIS — C50911 Malignant neoplasm of unspecified site of right female breast: Secondary | ICD-10-CM | POA: Diagnosis not present

## 2017-06-11 MED ORDER — SODIUM CHLORIDE 0.9 % IV SOLN
20.0000 mg/m2 | Freq: Once | INTRAVENOUS | Status: AC
  Administered 2017-06-11: 35 mg via INTRAVENOUS
  Filled 2017-06-11: qty 7

## 2017-06-11 MED ORDER — DIPHENHYDRAMINE HCL 25 MG PO CAPS
25.0000 mg | ORAL_CAPSULE | Freq: Once | ORAL | Status: AC
  Administered 2017-06-11: 25 mg via ORAL
  Filled 2017-06-11: qty 1

## 2017-06-11 MED ORDER — SODIUM CHLORIDE 0.9 % IV SOLN
250.0000 mL | Freq: Once | INTRAVENOUS | Status: AC
  Administered 2017-06-11: 250 mL via INTRAVENOUS
  Filled 2017-06-11: qty 250

## 2017-06-11 MED ORDER — ACETAMINOPHEN 325 MG PO TABS
650.0000 mg | ORAL_TABLET | Freq: Once | ORAL | Status: AC
  Administered 2017-06-11: 650 mg via ORAL
  Filled 2017-06-11: qty 2

## 2017-06-11 MED ORDER — HEPARIN SOD (PORK) LOCK FLUSH 100 UNIT/ML IV SOLN
500.0000 [IU] | Freq: Once | INTRAVENOUS | Status: AC | PRN
  Administered 2017-06-11: 500 [IU]
  Filled 2017-06-11: qty 5

## 2017-06-11 MED ORDER — METHYLPREDNISOLONE SODIUM SUCC 125 MG IJ SOLR
40.0000 mg | Freq: Once | INTRAMUSCULAR | Status: AC
  Administered 2017-06-11: 40 mg via INTRAVENOUS
  Filled 2017-06-11: qty 2

## 2017-06-11 MED ORDER — PROCHLORPERAZINE MALEATE 10 MG PO TABS
10.0000 mg | ORAL_TABLET | Freq: Once | ORAL | Status: AC
  Administered 2017-06-11: 10 mg via ORAL
  Filled 2017-06-11: qty 1

## 2017-06-11 MED ORDER — SODIUM CHLORIDE 0.9 % IV SOLN
Freq: Once | INTRAVENOUS | Status: AC
  Administered 2017-06-11: 10:00:00 via INTRAVENOUS
  Filled 2017-06-11: qty 1000

## 2017-06-11 NOTE — Progress Notes (Signed)
Clarified MD orders. Per Jerene Pitch CMA per Dr. Rogue Bussing pt not receiving Nplate, and pt to receive Vitamin B12 next week.

## 2017-06-12 ENCOUNTER — Inpatient Hospital Stay: Payer: Medicare Other

## 2017-06-12 ENCOUNTER — Encounter: Payer: Self-pay | Admitting: Internal Medicine

## 2017-06-12 VITALS — BP 122/71 | HR 90 | Temp 97.2°F | Resp 18

## 2017-06-12 DIAGNOSIS — Z170 Estrogen receptor positive status [ER+]: Secondary | ICD-10-CM | POA: Diagnosis not present

## 2017-06-12 DIAGNOSIS — C50911 Malignant neoplasm of unspecified site of right female breast: Secondary | ICD-10-CM | POA: Diagnosis not present

## 2017-06-12 DIAGNOSIS — D469 Myelodysplastic syndrome, unspecified: Secondary | ICD-10-CM | POA: Diagnosis not present

## 2017-06-12 DIAGNOSIS — C7801 Secondary malignant neoplasm of right lung: Secondary | ICD-10-CM | POA: Diagnosis not present

## 2017-06-12 DIAGNOSIS — Z5111 Encounter for antineoplastic chemotherapy: Secondary | ICD-10-CM | POA: Diagnosis not present

## 2017-06-12 DIAGNOSIS — Z79811 Long term (current) use of aromatase inhibitors: Secondary | ICD-10-CM | POA: Diagnosis not present

## 2017-06-12 LAB — BPAM PLATELET PHERESIS
Blood Product Expiration Date: 201904021440
ISSUE DATE / TIME: 201904021418
Unit Type and Rh: 5100

## 2017-06-12 LAB — PREPARE PLATELET PHERESIS: Unit division: 0

## 2017-06-12 MED ORDER — SODIUM CHLORIDE 0.9 % IV SOLN
20.0000 mg/m2 | Freq: Once | INTRAVENOUS | Status: AC
  Administered 2017-06-12: 35 mg via INTRAVENOUS
  Filled 2017-06-12: qty 7

## 2017-06-12 MED ORDER — PROCHLORPERAZINE MALEATE 10 MG PO TABS
10.0000 mg | ORAL_TABLET | Freq: Once | ORAL | Status: AC
  Administered 2017-06-12: 10 mg via ORAL
  Filled 2017-06-12: qty 1

## 2017-06-12 MED ORDER — HEPARIN SOD (PORK) LOCK FLUSH 100 UNIT/ML IV SOLN
500.0000 [IU] | Freq: Once | INTRAVENOUS | Status: AC | PRN
  Administered 2017-06-12: 500 [IU]
  Filled 2017-06-12: qty 5

## 2017-06-12 MED ORDER — SODIUM CHLORIDE 0.9 % IV SOLN
Freq: Once | INTRAVENOUS | Status: AC
  Administered 2017-06-12: 14:00:00 via INTRAVENOUS
  Filled 2017-06-12: qty 1000

## 2017-06-13 ENCOUNTER — Inpatient Hospital Stay: Payer: Medicare Other

## 2017-06-13 ENCOUNTER — Telehealth: Payer: Self-pay | Admitting: *Deleted

## 2017-06-13 VITALS — BP 105/70 | HR 93 | Temp 98.2°F | Resp 18

## 2017-06-13 DIAGNOSIS — D469 Myelodysplastic syndrome, unspecified: Secondary | ICD-10-CM

## 2017-06-13 DIAGNOSIS — Z170 Estrogen receptor positive status [ER+]: Secondary | ICD-10-CM | POA: Diagnosis not present

## 2017-06-13 DIAGNOSIS — Z79811 Long term (current) use of aromatase inhibitors: Secondary | ICD-10-CM | POA: Diagnosis not present

## 2017-06-13 DIAGNOSIS — C50911 Malignant neoplasm of unspecified site of right female breast: Secondary | ICD-10-CM | POA: Diagnosis not present

## 2017-06-13 DIAGNOSIS — C7801 Secondary malignant neoplasm of right lung: Secondary | ICD-10-CM | POA: Diagnosis not present

## 2017-06-13 DIAGNOSIS — Z5111 Encounter for antineoplastic chemotherapy: Secondary | ICD-10-CM | POA: Diagnosis not present

## 2017-06-13 LAB — CBC WITH DIFFERENTIAL/PLATELET
BASOS PCT: 0 %
Basophils Absolute: 0 10*3/uL (ref 0–0.1)
Eosinophils Absolute: 0 10*3/uL (ref 0–0.7)
Eosinophils Relative: 1 %
HEMATOCRIT: 23.1 % — AB (ref 35.0–47.0)
Hemoglobin: 8.2 g/dL — ABNORMAL LOW (ref 12.0–16.0)
LYMPHS PCT: 63 %
Lymphs Abs: 1.2 10*3/uL (ref 1.0–3.6)
MCH: 30.3 pg (ref 26.0–34.0)
MCHC: 35.7 g/dL (ref 32.0–36.0)
MCV: 85.1 fL (ref 80.0–100.0)
MONOS PCT: 4 %
Monocytes Absolute: 0.1 10*3/uL — ABNORMAL LOW (ref 0.2–0.9)
NEUTROS ABS: 0.6 10*3/uL — AB (ref 1.4–6.5)
NEUTROS PCT: 32 %
Platelets: 21 10*3/uL — CL (ref 150–400)
RBC: 2.72 MIL/uL — ABNORMAL LOW (ref 3.80–5.20)
RDW: 14.3 % (ref 11.5–14.5)
WBC: 2 10*3/uL — ABNORMAL LOW (ref 3.6–11.0)

## 2017-06-13 LAB — SAMPLE TO BLOOD BANK

## 2017-06-13 MED ORDER — SODIUM CHLORIDE 0.9 % IV SOLN
Freq: Once | INTRAVENOUS | Status: AC
  Administered 2017-06-13: 14:00:00 via INTRAVENOUS
  Filled 2017-06-13: qty 1000

## 2017-06-13 MED ORDER — PROCHLORPERAZINE MALEATE 10 MG PO TABS
10.0000 mg | ORAL_TABLET | Freq: Once | ORAL | Status: AC
  Administered 2017-06-13: 10 mg via ORAL
  Filled 2017-06-13: qty 1

## 2017-06-13 MED ORDER — SODIUM CHLORIDE 0.9 % IV SOLN
20.0000 mg/m2 | Freq: Once | INTRAVENOUS | Status: AC
  Administered 2017-06-13: 35 mg via INTRAVENOUS
  Filled 2017-06-13: qty 7

## 2017-06-13 MED ORDER — HEPARIN SOD (PORK) LOCK FLUSH 100 UNIT/ML IV SOLN
INTRAVENOUS | Status: AC
  Filled 2017-06-13: qty 5

## 2017-06-13 MED ORDER — HEPARIN SOD (PORK) LOCK FLUSH 100 UNIT/ML IV SOLN
500.0000 [IU] | Freq: Once | INTRAVENOUS | Status: AC
  Administered 2017-06-13: 500 [IU] via INTRAVENOUS

## 2017-06-13 NOTE — Telephone Encounter (Signed)
43 - Received call from [place] in cancer ctr lab regarding a Critical plt count of 21 for Shelly Arias 12/22/1945. 4- Dr. Rogue Bussing informed -Will not infuse today per Dr. Rogue Bussing. Patient informed of lab values. She states that she is tolerating her currently chemotherapy course well. She reports some dry mouth, but is using baking soda mouth rinses daily.  She has no other complaints.

## 2017-06-14 ENCOUNTER — Inpatient Hospital Stay: Payer: Medicare Other

## 2017-06-14 DIAGNOSIS — Z79811 Long term (current) use of aromatase inhibitors: Secondary | ICD-10-CM | POA: Diagnosis not present

## 2017-06-14 DIAGNOSIS — D469 Myelodysplastic syndrome, unspecified: Secondary | ICD-10-CM | POA: Diagnosis not present

## 2017-06-14 DIAGNOSIS — Z5111 Encounter for antineoplastic chemotherapy: Secondary | ICD-10-CM | POA: Diagnosis not present

## 2017-06-14 DIAGNOSIS — C50911 Malignant neoplasm of unspecified site of right female breast: Secondary | ICD-10-CM | POA: Diagnosis not present

## 2017-06-14 DIAGNOSIS — C7801 Secondary malignant neoplasm of right lung: Secondary | ICD-10-CM | POA: Diagnosis not present

## 2017-06-14 DIAGNOSIS — Z170 Estrogen receptor positive status [ER+]: Secondary | ICD-10-CM | POA: Diagnosis not present

## 2017-06-14 MED ORDER — SODIUM CHLORIDE 0.9 % IV SOLN
20.0000 mg/m2 | Freq: Once | INTRAVENOUS | Status: AC
  Administered 2017-06-14: 35 mg via INTRAVENOUS
  Filled 2017-06-14: qty 7

## 2017-06-14 MED ORDER — HEPARIN SOD (PORK) LOCK FLUSH 100 UNIT/ML IV SOLN
500.0000 [IU] | Freq: Once | INTRAVENOUS | Status: AC | PRN
  Administered 2017-06-14: 500 [IU]

## 2017-06-14 MED ORDER — SODIUM CHLORIDE 0.9 % IV SOLN
Freq: Once | INTRAVENOUS | Status: AC
  Administered 2017-06-14: 10:00:00 via INTRAVENOUS
  Filled 2017-06-14: qty 1000

## 2017-06-14 MED ORDER — PROCHLORPERAZINE MALEATE 10 MG PO TABS
10.0000 mg | ORAL_TABLET | Freq: Once | ORAL | Status: AC
  Administered 2017-06-14: 10 mg via ORAL
  Filled 2017-06-14: qty 1

## 2017-06-17 ENCOUNTER — Other Ambulatory Visit: Payer: Self-pay

## 2017-06-17 ENCOUNTER — Other Ambulatory Visit: Payer: Self-pay | Admitting: Internal Medicine

## 2017-06-17 ENCOUNTER — Inpatient Hospital Stay: Payer: Medicare Other

## 2017-06-17 DIAGNOSIS — Z79811 Long term (current) use of aromatase inhibitors: Secondary | ICD-10-CM | POA: Diagnosis not present

## 2017-06-17 DIAGNOSIS — C7801 Secondary malignant neoplasm of right lung: Secondary | ICD-10-CM | POA: Diagnosis not present

## 2017-06-17 DIAGNOSIS — D469 Myelodysplastic syndrome, unspecified: Secondary | ICD-10-CM | POA: Diagnosis not present

## 2017-06-17 DIAGNOSIS — C50911 Malignant neoplasm of unspecified site of right female breast: Secondary | ICD-10-CM | POA: Diagnosis not present

## 2017-06-17 DIAGNOSIS — Z5111 Encounter for antineoplastic chemotherapy: Secondary | ICD-10-CM | POA: Diagnosis not present

## 2017-06-17 DIAGNOSIS — D696 Thrombocytopenia, unspecified: Secondary | ICD-10-CM

## 2017-06-17 DIAGNOSIS — Z170 Estrogen receptor positive status [ER+]: Secondary | ICD-10-CM | POA: Diagnosis not present

## 2017-06-17 LAB — CBC WITH DIFFERENTIAL/PLATELET
BASOS ABS: 0 10*3/uL (ref 0–0.1)
Basophils Relative: 0 %
Eosinophils Absolute: 0 10*3/uL (ref 0–0.7)
Eosinophils Relative: 1 %
HEMATOCRIT: 23.3 % — AB (ref 35.0–47.0)
Hemoglobin: 8.3 g/dL — ABNORMAL LOW (ref 12.0–16.0)
LYMPHS PCT: 75 %
Lymphs Abs: 1.2 10*3/uL (ref 1.0–3.6)
MCH: 30.1 pg (ref 26.0–34.0)
MCHC: 35.6 g/dL (ref 32.0–36.0)
MCV: 84.5 fL (ref 80.0–100.0)
Monocytes Absolute: 0 10*3/uL — ABNORMAL LOW (ref 0.2–0.9)
Monocytes Relative: 1 %
NEUTROS ABS: 0.4 10*3/uL — AB (ref 1.4–6.5)
Neutrophils Relative %: 23 %
Platelets: 6 10*3/uL — CL (ref 150–400)
RBC: 2.76 MIL/uL — AB (ref 3.80–5.20)
RDW: 14.3 % (ref 11.5–14.5)
WBC: 1.6 10*3/uL — AB (ref 3.6–11.0)

## 2017-06-17 LAB — SAMPLE TO BLOOD BANK

## 2017-06-18 ENCOUNTER — Inpatient Hospital Stay: Payer: Medicare Other

## 2017-06-18 VITALS — BP 119/72 | HR 85 | Temp 97.0°F | Resp 20

## 2017-06-18 DIAGNOSIS — C50911 Malignant neoplasm of unspecified site of right female breast: Secondary | ICD-10-CM | POA: Diagnosis not present

## 2017-06-18 DIAGNOSIS — C7801 Secondary malignant neoplasm of right lung: Secondary | ICD-10-CM | POA: Diagnosis not present

## 2017-06-18 DIAGNOSIS — C50811 Malignant neoplasm of overlapping sites of right female breast: Secondary | ICD-10-CM

## 2017-06-18 DIAGNOSIS — D469 Myelodysplastic syndrome, unspecified: Secondary | ICD-10-CM | POA: Diagnosis not present

## 2017-06-18 DIAGNOSIS — Z79811 Long term (current) use of aromatase inhibitors: Secondary | ICD-10-CM | POA: Diagnosis not present

## 2017-06-18 DIAGNOSIS — Z170 Estrogen receptor positive status [ER+]: Secondary | ICD-10-CM

## 2017-06-18 DIAGNOSIS — D693 Immune thrombocytopenic purpura: Secondary | ICD-10-CM

## 2017-06-18 DIAGNOSIS — Z5111 Encounter for antineoplastic chemotherapy: Secondary | ICD-10-CM | POA: Diagnosis not present

## 2017-06-18 DIAGNOSIS — D696 Thrombocytopenia, unspecified: Secondary | ICD-10-CM

## 2017-06-18 MED ORDER — METHYLPREDNISOLONE SODIUM SUCC 125 MG IJ SOLR
40.0000 mg | Freq: Once | INTRAMUSCULAR | Status: AC
  Administered 2017-06-18: 40 mg via INTRAVENOUS
  Filled 2017-06-18: qty 2

## 2017-06-18 MED ORDER — HEPARIN SOD (PORK) LOCK FLUSH 100 UNIT/ML IV SOLN
500.0000 [IU] | Freq: Every day | INTRAVENOUS | Status: AC | PRN
  Administered 2017-06-18: 500 [IU]
  Filled 2017-06-18: qty 5

## 2017-06-18 MED ORDER — ACETAMINOPHEN 325 MG PO TABS
650.0000 mg | ORAL_TABLET | Freq: Once | ORAL | Status: AC
  Administered 2017-06-18: 650 mg via ORAL
  Filled 2017-06-18: qty 2

## 2017-06-18 MED ORDER — CYANOCOBALAMIN 1000 MCG/ML IJ SOLN
1000.0000 ug | Freq: Once | INTRAMUSCULAR | Status: AC
  Administered 2017-06-18: 1000 ug via INTRAMUSCULAR
  Filled 2017-06-18: qty 1

## 2017-06-18 MED ORDER — SODIUM CHLORIDE 0.9 % IV SOLN
250.0000 mL | Freq: Once | INTRAVENOUS | Status: AC
  Administered 2017-06-18: 250 mL via INTRAVENOUS
  Filled 2017-06-18: qty 250

## 2017-06-18 MED ORDER — SODIUM CHLORIDE 0.9% FLUSH
10.0000 mL | INTRAVENOUS | Status: AC | PRN
  Administered 2017-06-18: 10 mL
  Filled 2017-06-18: qty 10

## 2017-06-18 MED ORDER — DIPHENHYDRAMINE HCL 25 MG PO CAPS
25.0000 mg | ORAL_CAPSULE | Freq: Once | ORAL | Status: AC
  Administered 2017-06-18: 25 mg via ORAL
  Filled 2017-06-18: qty 1

## 2017-06-19 LAB — BPAM PLATELET PHERESIS
Blood Product Expiration Date: 201904091430
ISSUE DATE / TIME: 201904091343
UNIT TYPE AND RH: 5100

## 2017-06-19 LAB — PREPARE PLATELET PHERESIS: Unit division: 0

## 2017-06-20 ENCOUNTER — Inpatient Hospital Stay: Payer: Medicare Other

## 2017-06-20 ENCOUNTER — Telehealth: Payer: Self-pay

## 2017-06-20 ENCOUNTER — Other Ambulatory Visit: Payer: Self-pay | Admitting: *Deleted

## 2017-06-20 ENCOUNTER — Other Ambulatory Visit: Payer: Self-pay | Admitting: Internal Medicine

## 2017-06-20 DIAGNOSIS — D469 Myelodysplastic syndrome, unspecified: Secondary | ICD-10-CM

## 2017-06-20 DIAGNOSIS — Z5111 Encounter for antineoplastic chemotherapy: Secondary | ICD-10-CM | POA: Diagnosis not present

## 2017-06-20 DIAGNOSIS — Z79811 Long term (current) use of aromatase inhibitors: Secondary | ICD-10-CM | POA: Diagnosis not present

## 2017-06-20 DIAGNOSIS — Z170 Estrogen receptor positive status [ER+]: Secondary | ICD-10-CM | POA: Diagnosis not present

## 2017-06-20 DIAGNOSIS — D649 Anemia, unspecified: Secondary | ICD-10-CM

## 2017-06-20 DIAGNOSIS — C7801 Secondary malignant neoplasm of right lung: Secondary | ICD-10-CM | POA: Diagnosis not present

## 2017-06-20 DIAGNOSIS — C50911 Malignant neoplasm of unspecified site of right female breast: Secondary | ICD-10-CM | POA: Diagnosis not present

## 2017-06-20 DIAGNOSIS — D696 Thrombocytopenia, unspecified: Secondary | ICD-10-CM

## 2017-06-20 LAB — CBC WITH DIFFERENTIAL/PLATELET
Basophils Absolute: 0 10*3/uL (ref 0–0.1)
Basophils Relative: 0 %
EOS PCT: 0 %
Eosinophils Absolute: 0 10*3/uL (ref 0–0.7)
HEMATOCRIT: 21 % — AB (ref 35.0–47.0)
Hemoglobin: 7.5 g/dL — ABNORMAL LOW (ref 12.0–16.0)
LYMPHS PCT: 92 %
Lymphs Abs: 1.4 10*3/uL (ref 1.0–3.6)
MCH: 30.1 pg (ref 26.0–34.0)
MCHC: 35.6 g/dL (ref 32.0–36.0)
MCV: 84.8 fL (ref 80.0–100.0)
MONO ABS: 0 10*3/uL — AB (ref 0.2–0.9)
Monocytes Relative: 1 %
NEUTROS ABS: 0.1 10*3/uL — AB (ref 1.4–6.5)
Neutrophils Relative %: 7 %
PLATELETS: 41 10*3/uL — AB (ref 150–440)
RBC: 2.48 MIL/uL — AB (ref 3.80–5.20)
RDW: 14 % (ref 11.5–14.5)
WBC: 1.5 10*3/uL — ABNORMAL LOW (ref 3.6–11.0)

## 2017-06-20 LAB — SAMPLE TO BLOOD BANK

## 2017-06-20 LAB — PREPARE RBC (CROSSMATCH)

## 2017-06-20 MED ORDER — LETROZOLE 2.5 MG PO TABS: 2.5000 mg | ORAL_TABLET | Freq: Every day | ORAL | 0 refills | Status: DC

## 2017-06-20 NOTE — Telephone Encounter (Signed)
Added order for 1 unit of blood tomorrow & blood bank notified.

## 2017-06-20 NOTE — Telephone Encounter (Signed)
Patient made aware of the plan of care.

## 2017-06-20 NOTE — Telephone Encounter (Signed)
I received a phone call from Maudie Mercury in the lab with a critical lab value for patient.  ANC is 0.1 Hemoglobin is 7.5 Platelets are 41 Read back process preformed with lab tech & Dr. Rogue Bussing notified at 1:30PM.

## 2017-06-21 ENCOUNTER — Inpatient Hospital Stay: Payer: Medicare Other

## 2017-06-21 DIAGNOSIS — D469 Myelodysplastic syndrome, unspecified: Secondary | ICD-10-CM | POA: Diagnosis not present

## 2017-06-21 DIAGNOSIS — Z79811 Long term (current) use of aromatase inhibitors: Secondary | ICD-10-CM | POA: Diagnosis not present

## 2017-06-21 DIAGNOSIS — Z170 Estrogen receptor positive status [ER+]: Secondary | ICD-10-CM | POA: Diagnosis not present

## 2017-06-21 DIAGNOSIS — C7801 Secondary malignant neoplasm of right lung: Secondary | ICD-10-CM | POA: Diagnosis not present

## 2017-06-21 DIAGNOSIS — C50911 Malignant neoplasm of unspecified site of right female breast: Secondary | ICD-10-CM | POA: Diagnosis not present

## 2017-06-21 DIAGNOSIS — D649 Anemia, unspecified: Secondary | ICD-10-CM

## 2017-06-21 DIAGNOSIS — Z5111 Encounter for antineoplastic chemotherapy: Secondary | ICD-10-CM | POA: Diagnosis not present

## 2017-06-21 MED ORDER — SODIUM CHLORIDE 0.9 % IV SOLN
250.0000 mL | Freq: Once | INTRAVENOUS | Status: AC
  Administered 2017-06-21: 250 mL via INTRAVENOUS
  Filled 2017-06-21: qty 250

## 2017-06-21 MED ORDER — ACETAMINOPHEN 325 MG PO TABS
650.0000 mg | ORAL_TABLET | Freq: Once | ORAL | Status: AC
  Administered 2017-06-21: 650 mg via ORAL
  Filled 2017-06-21: qty 2

## 2017-06-21 MED ORDER — DIPHENHYDRAMINE HCL 25 MG PO CAPS
25.0000 mg | ORAL_CAPSULE | Freq: Once | ORAL | Status: AC
  Administered 2017-06-21: 25 mg via ORAL
  Filled 2017-06-21: qty 1

## 2017-06-21 MED ORDER — SODIUM CHLORIDE 0.9% FLUSH
3.0000 mL | INTRAVENOUS | Status: DC | PRN
  Filled 2017-06-21: qty 3

## 2017-06-21 MED ORDER — HEPARIN SOD (PORK) LOCK FLUSH 100 UNIT/ML IV SOLN
500.0000 [IU] | Freq: Once | INTRAVENOUS | Status: AC
  Administered 2017-06-21: 500 [IU] via INTRAVENOUS
  Filled 2017-06-21: qty 5

## 2017-06-21 NOTE — Progress Notes (Signed)
Pt reports intermittent abdominal cramping. States it has been all week, and that it happens after she eats and that it could be related to the chocolate milk she has been drinking. She states she normally has BM everyday "like clock work" and she did not go Monday, she took miralax on Monday andhad normal BM on Tuesday, Wednesday and Thursday.  Pt is currently taking Zofran. Pt educated to keep a food diary and track the abdominal cramping and bring that to MD visit Monday, and to continue to use stool softeners and miralax as needed and push fluids. Pt verbalizes understanding. MD made aware by Jerilee Hoh.

## 2017-06-22 LAB — TYPE AND SCREEN
ABO/RH(D): O POS
ANTIBODY SCREEN: POSITIVE
Unit division: 0

## 2017-06-22 LAB — BPAM RBC
BLOOD PRODUCT EXPIRATION DATE: 201905042359
ISSUE DATE / TIME: 201904121352
UNIT TYPE AND RH: 5100

## 2017-06-24 ENCOUNTER — Other Ambulatory Visit: Payer: Self-pay

## 2017-06-24 ENCOUNTER — Ambulatory Visit: Payer: Medicare Other

## 2017-06-24 ENCOUNTER — Inpatient Hospital Stay (HOSPITAL_BASED_OUTPATIENT_CLINIC_OR_DEPARTMENT_OTHER): Payer: Medicare Other | Admitting: Internal Medicine

## 2017-06-24 ENCOUNTER — Telehealth: Payer: Self-pay | Admitting: *Deleted

## 2017-06-24 ENCOUNTER — Encounter: Payer: Self-pay | Admitting: Internal Medicine

## 2017-06-24 ENCOUNTER — Inpatient Hospital Stay: Payer: Medicare Other

## 2017-06-24 VITALS — BP 110/67 | HR 114 | Temp 97.6°F | Resp 20 | Ht 61.0 in | Wt 138.0 lb

## 2017-06-24 DIAGNOSIS — Z79811 Long term (current) use of aromatase inhibitors: Secondary | ICD-10-CM | POA: Diagnosis not present

## 2017-06-24 DIAGNOSIS — F17210 Nicotine dependence, cigarettes, uncomplicated: Secondary | ICD-10-CM | POA: Diagnosis not present

## 2017-06-24 DIAGNOSIS — D696 Thrombocytopenia, unspecified: Secondary | ICD-10-CM | POA: Diagnosis not present

## 2017-06-24 DIAGNOSIS — I10 Essential (primary) hypertension: Secondary | ICD-10-CM | POA: Diagnosis not present

## 2017-06-24 DIAGNOSIS — C50911 Malignant neoplasm of unspecified site of right female breast: Secondary | ICD-10-CM | POA: Diagnosis not present

## 2017-06-24 DIAGNOSIS — M1990 Unspecified osteoarthritis, unspecified site: Secondary | ICD-10-CM | POA: Diagnosis not present

## 2017-06-24 DIAGNOSIS — C7801 Secondary malignant neoplasm of right lung: Secondary | ICD-10-CM | POA: Diagnosis not present

## 2017-06-24 DIAGNOSIS — J449 Chronic obstructive pulmonary disease, unspecified: Secondary | ICD-10-CM

## 2017-06-24 DIAGNOSIS — D469 Myelodysplastic syndrome, unspecified: Secondary | ICD-10-CM

## 2017-06-24 DIAGNOSIS — K219 Gastro-esophageal reflux disease without esophagitis: Secondary | ICD-10-CM | POA: Diagnosis not present

## 2017-06-24 DIAGNOSIS — Z170 Estrogen receptor positive status [ER+]: Secondary | ICD-10-CM | POA: Diagnosis not present

## 2017-06-24 DIAGNOSIS — Z8614 Personal history of Methicillin resistant Staphylococcus aureus infection: Secondary | ICD-10-CM

## 2017-06-24 DIAGNOSIS — Z5111 Encounter for antineoplastic chemotherapy: Secondary | ICD-10-CM

## 2017-06-24 DIAGNOSIS — Z9221 Personal history of antineoplastic chemotherapy: Secondary | ICD-10-CM

## 2017-06-24 DIAGNOSIS — Z923 Personal history of irradiation: Secondary | ICD-10-CM

## 2017-06-24 DIAGNOSIS — Z803 Family history of malignant neoplasm of breast: Secondary | ICD-10-CM

## 2017-06-24 DIAGNOSIS — M810 Age-related osteoporosis without current pathological fracture: Secondary | ICD-10-CM | POA: Diagnosis not present

## 2017-06-24 LAB — CBC WITH DIFFERENTIAL/PLATELET
BASOS ABS: 0 10*3/uL (ref 0–0.1)
Basophils Relative: 0 %
Eosinophils Absolute: 0 10*3/uL (ref 0–0.7)
Eosinophils Relative: 0 %
HEMATOCRIT: 27.1 % — AB (ref 35.0–47.0)
Hemoglobin: 9.5 g/dL — ABNORMAL LOW (ref 12.0–16.0)
LYMPHS PCT: 98 %
Lymphs Abs: 0.9 10*3/uL — ABNORMAL LOW (ref 1.0–3.6)
MCH: 29.9 pg (ref 26.0–34.0)
MCHC: 35.1 g/dL (ref 32.0–36.0)
MCV: 85.2 fL (ref 80.0–100.0)
MONO ABS: 0 10*3/uL — AB (ref 0.2–0.9)
MONOS PCT: 1 %
NEUTROS ABS: 0 10*3/uL — AB (ref 1.4–6.5)
Neutrophils Relative %: 1 %
Platelets: 15 10*3/uL — CL (ref 150–400)
RBC: 3.18 MIL/uL — ABNORMAL LOW (ref 3.80–5.20)
RDW: 14.3 % (ref 11.5–14.5)
WBC: 0.9 10*3/uL — CL (ref 3.6–11.0)

## 2017-06-24 LAB — SAMPLE TO BLOOD BANK

## 2017-06-24 MED ORDER — FLUCONAZOLE 200 MG PO TABS: 200.0000 mg | ORAL_TABLET | Freq: Every day | ORAL | 3 refills | Status: DC

## 2017-06-24 MED ORDER — LEVOFLOXACIN 500 MG PO TABS: 500.0000 mg | ORAL_TABLET | Freq: Every day | ORAL | 3 refills | Status: DC

## 2017-06-24 MED ORDER — ACYCLOVIR 400 MG PO TABS: 400.0000 mg | ORAL_TABLET | Freq: Two times a day (BID) | ORAL | 3 refills | Status: DC

## 2017-06-24 NOTE — Telephone Encounter (Signed)
hgb 9.0 per Maudie Mercury in cancer ctr lab

## 2017-06-24 NOTE — Telephone Encounter (Signed)
Lab called Platelet 15 ANC 0.0

## 2017-06-24 NOTE — Progress Notes (Signed)
[facility] [place] OFFICE PROGRESS NOTE  Patient Care Team: [name], MD as PCP - General (Family Medicine) [name], DPM as Consulting Physician (Podiatry) [name], MD as Consulting Physician (Internal Medicine) Robert Bellow, MD (General Surgery)   SUMMARY OF ONCOLOGIC HISTORY:  Oncology History   # April 2018- Left chest wall Bx- IMC; ER-PR-POS; her 2 Neu NEG; PET- mild hilar/distal adenopathy; ~1 cm right lung nodule/ several sub-centimeter.   # May 2018- cont [facility]; Abemacliclib [on HOLD sec to cytopenia]; MARCH 2019- PET near CR from breast cancer  # April 2017-isolated Thrombocytopenia- platelets- 113; worsening PANCYTOPENIA- April 2018- BMBx- no evidence of malignancy; MDS/Acute leuk; Karyotype/MDS- FISH panel-NEG [d/w Dr.Smir];   # June 2018- REPEAT BMBx/[facility] [June 2018-Dr.Foster; Aug 2018- Dr.Powell at [facility]]; No Diagnosis.   # April 11, 2017 [third bone marrow]-is still inconclusive; no obvious evidence of acute leukemia or obvious MDS except for mild dysplastic changes; and significantly reduced megakaryocytes. 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 [reviwed at [facility]; [facility]-NEG]  # April 1st 2019- Dacogen   # May 29th 2018- N-plate- suboptimal improvement; NOV 1st week start promacta 50 mg/day; suboptimal response; DEC 1st- start promacta 75 mg/day  # DEC 18th-INCREASE PROMACTA to 150m/day  # 2006- RIGHT BREAST CA [[id number]; STAGE I; ER/PRPos; Her 2 Neu-NEG] s/p FEC x 6 [NSABP B-36]; Femara [Dec 2007-2012]  # Osteoporosis s/p Reclast; BMD- 2015-wnl- ca+vit D   Dr.Stewart [derm ? 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  Patient started Dacogen approximately 2 weeks ago.  Patient noted to have mild abdominal discomfort with mild diarrhea.  She also had nausea posttreatment. Question attributed to lactose intolerance; modified diet. Currently resolved.    Patient has been needing platelet transfusion about once a week; and PRBC transfusion every 3-4 weeks.  Otherwise denies any blood in stools or black colored stools.  She continues to take Femara; no body aches or joint pains.  Denies any headaches.  No new lumps or bumps.  No fever no chills.  REVIEW OF SYSTEMS:  A complete 10 point review of system is done which is negative except mentioned above/history of present illness.   PAST MEDICAL HISTORY :  Past Medical History:  Diagnosis Date  . Blood dyscrasia   . Breast cancer ([facility]    right, lumpectomy, radiation, chemo  . [place] [place]    Right, [place]  . Breast [place] ([facility]   INVASIVE DUCTAL CARCINOMA.  . Collapsed lung 02/14/2017   RIGHT  . Complication of anesthesia    PT STATED AFTER BIL HIP SURGERIES SHE STOPPED BREATHING THE NIGHT AFTER SURGERY  . COPD (chronic obstructive pulmonary disease) ([facility]   . GERD (gastroesophageal reflux disease)   . Headache   . History of chemotherapy   . History of methicillin resistant staphylococcus aureus (MRSA) 2011  . History of radiation therapy   . Hyperlipidemia   . Hypertension   . Liver cancer ([facility] 05[place]  . Lung cancer ([facility] 05/2016  . Osteoarthritis    right hip  . Osteoporosis   .  Squamous cell carcinoma    leg, Followed by Dr. Nicole Kindred    PAST SURGICAL HISTORY :   Past Surgical History:  Procedure Laterality Date  . ABDOMINAL HYSTERECTOMY    . BREAST BIOPSY Left 2002   left breast, calcifications  . BREAST BIOPSY Left 06/20/2016    INVASIVE DUCTAL CARCINOMA.  Marland Kitchen BREAST EXCISIONAL BIOPSY Right 2007   positive  . bunion repair    . ESOPHAGOGASTRODUODENOSCOPY (EGD) WITH PROPOFOL N/A 08/05/2016   Procedure: ESOPHAGOGASTRODUODENOSCOPY (EGD) WITH PROPOFOL;  Surgeon: San Jetty, MD;  Location: [facility] ENDOSCOPY;  Service: General;  Laterality: N/A;  . JOINT REPLACEMENT    . left breast biopsy    . NASAL SINUS SURGERY    . PORTACATH PLACEMENT Right 04/13/2017   Procedure: INSERTION PORT-A-CATH- RIGHT INTERNAL JUGULAR;  Surgeon: Robert Bellow, MD;  Location: [facility] ORS;  Service: General;  Laterality: Right;  . right hip replacement    . SHOULDER SURGERY    . SQUAMOUS CELL CARCINOMA EXCISION     right leg, Dr. Nicole Kindred  . TOTAL HIP ARTHROPLASTY     right    FAMILY HISTORY :   Family History  Problem Relation Age of Onset  . Heart disease Father 59  . Heart disease Mother   . Hyperlipidemia Mother   . Heart disease Brother   . Diabetes Brother   . Diabetes Maternal Grandmother   . Breast cancer Cousin   . Kidney disease Brother     SOCIAL HISTORY:   Social History   Tobacco Use  . Smoking status: Current Some Day Smoker    Packs/day: 0.10    Years: 30.00    Pack years: 3.00    Types: Cigarettes  . Smokeless tobacco: Never Used  Substance Use Topics  . Alcohol use: Yes    Alcohol/week: 0.0 oz    Comment: socially  . Drug use: No    ALLERGIES:  is allergic to codeine; fish-derived products; morphine sulfate; adhesive [tape]; and oxycodone.  MEDICATIONS:  Current Outpatient Medications  Medication Sig Dispense Refill  . ALPRAZolam (XANAX) 0.5 MG tablet TAKE 1 TABLET BY MOUTH EVERY 8 HOURS AS NEEDED FOR ANXIETY OR SLEEP (Patient taking differently: take 1 tablet by mouth at bedtime) 30 tablet 2  . cyanocobalamin (,VITAMIN B-12,) 1000 MCG/ML injection Inject 1,000 mcg into the muscle every 30 (thirty) days.    . ergocalciferol (VITAMIN D2) 50000 units capsule Take 1 capsule (50,000 Units total) by mouth  every Saturday. In the morning. 12 capsule 0  . fexofenadine (ALLEGRA) 180 MG tablet Take 180 mg by mouth every morning.     . Glucosamine-Chondroitin (COSAMIN DS PO) Take 1 tablet by mouth daily. In the morning.    Marland Kitchen letrozole (FEMARA) 2.5 MG tablet Take 1 tablet (2.5 mg total) by mouth daily. Once a day. 90 tablet 0  . lidocaine-prilocaine (EMLA) cream Apply 1 application topically as needed. Apply generously over the Mediport 45 minutes prior to chemotherapy. 30 g 0  . metoprolol succinate (TOPROL-XL) 25 MG 24 hr tablet Take 1 tablet (25 mg total) by mouth daily. 90 tablet 3  . ondansetron (ZOFRAN) 8 MG tablet Take 1 tablet by mouth every 8 (eight) hours as needed for nausea/vomiting.    . pravastatin (PRAVACHOL) 40 MG tablet Take 1 tablet (40 mg total) by mouth daily. 90 tablet 3  . Probiotic Product (PROBIOTIC & ACIDOPHILUS EX ST PO) Take 1 tablet by mouth daily.     . ranitidine (ZANTAC) 75 MG tablet  Take 75 mg by mouth as needed.     . vitamin B-12 (CYANOCOBALAMIN) 1000 MCG tablet Take 1,000 mcg by mouth daily.     Marland Kitchen acyclovir (ZOVIRAX) 400 MG tablet Take 1 tablet (400 mg total) by mouth 2 (two) times daily. 60 tablet 3  . fluconazole (DIFLUCAN) 200 MG tablet Take 1 tablet (200 mg total) by mouth daily. 30 tablet 3  . levofloxacin (LEVAQUIN) 500 MG tablet Take 1 tablet (500 mg total) by mouth daily. 30 tablet 3  . neomycin-polymyxin-pramoxine (NEOSPORIN PLUS) 1 % cream Apply 1 application topically 2 (two) times daily as needed (Nasal passages as needed for dryness.).     No current facility-administered medications for this visit.     PHYSICAL EXAMINATION: ECOG PERFORMANCE STATUS: 0 - Asymptomatic  BP 110/67 Comment: sitting-orthostatics  Pulse (!) 114   Temp 97.6 F (36.4 C) (Tympanic)   Resp 20   Ht _0  (1.549 m)   Wt 138 lb (62.6 kg)   BMI 26.07 kg/m   Filed Weights   06/24/17 1039  Weight: 138 lb (62.6 kg)    GENERAL: Well-nourished well-developed; Alert, no  distress and comfortable.  She is alone. EYES: WNL.  OROPHARYNX: no thrush or ulceration; good dentition. No bleeding noted. NECK: supple, no masses felt LYMPH:  no palpable lymphadenopathy in the cervical, axillary or inguinal regions LUNGS: Decreased breath sounds to auscultation bilaterally and  No wheeze or crackles HEART/CVS: regular rate & rhythm and no murmurs; No lower extremity edema ABDOMEN:abdomen soft, non-tender and normal bowel sounds Musculoskeletal:no cyanosis of digits and no clubbing  PSYCH: alert & oriented x 3 with fluent speech NEURO: no focal motor/sensory deficits SKIN: Mild chronic ecchymosis.  Not any worse.   LABORATORY DATA:  I have reviewed the data as listed    Component Value Date/Time   NA 135 06/10/2017 1028   K 4.3 06/10/2017 1028   CL 102 06/10/2017 1028   CO2 26 06/10/2017 1028   GLUCOSE 116 (H) 06/10/2017 1028   BUN 14 06/10/2017 1028   CREATININE 0.70 06/10/2017 1028   CREATININE 0.67 07/09/2014 1321   CREATININE 0.70 04/30/2014 1455   CALCIUM 9.1 06/10/2017 1028   PROT 6.2 (L) 06/10/2017 1028   PROT 7.3 07/09/2014 1321   ALBUMIN 3.5 06/10/2017 1028   ALBUMIN 4.3 07/09/2014 1321   AST 18 06/10/2017 1028   AST 19 07/09/2014 1321   ALT 17 06/10/2017 1028   ALT 18 07/09/2014 1321   ALKPHOS 71 06/10/2017 1028   ALKPHOS 66 07/09/2014 1321   BILITOT 0.7 06/10/2017 1028   BILITOT 0.6 07/09/2014 1321   GFRNONAA >60 06/10/2017 1028   GFRNONAA >60 07/09/2014 1321   GFRAA >60 06/10/2017 1028   GFRAA >60 07/09/2014 1321    No results found for: SPEP, UPEP  Lab Results  Component Value Date   WBC 0.9 (LL) 06/24/2017   NEUTROABS 0.0 (L) 06/24/2017   HGB 9.5 (L) 06/24/2017   HCT 27.1 (L) 06/24/2017   MCV 85.2 06/24/2017   PLT 15 (LL) 06/24/2017      Chemistry      Component Value Date/Time   NA 135 06/10/2017 1028   K 4.3 06/10/2017 1028   CL 102 06/10/2017 1028   CO2 26 06/10/2017 1028   BUN 14 06/10/2017 1028   CREATININE 0.70  06/10/2017 1028   CREATININE 0.67 07/09/2014 1321   CREATININE 0.70 04/30/2014 1455      Component Value Date/Time   CALCIUM 9.1 06/10/2017 1028  ALKPHOS 71 06/10/2017 1028   ALKPHOS 66 07/09/2014 1321   AST 18 06/10/2017 1028   AST 19 07/09/2014 1321   ALT 17 06/10/2017 1028   ALT 18 07/09/2014 1321   BILITOT 0.7 06/10/2017 1028   BILITOT 0.6 07/09/2014 1321        ASSESSMENT & PLAN:   MDS (myelodysplastic syndrome) (HCC) # Severe thrombocytopenia//anemia- highly suspicious for MDS;/Katotype- slightly abnormal- trisomy 18 in 2 out of 17 metaphase cells.  Patient currently on Dacogen 20 mg/m square day 1 through 5 every 4 weeks; patient tolerated treatment fairly well except for mild abdominal cramping/diarrhea [see discussion below]  # cycle # 1day- 15 today; Today platelets are 15; hemoglobin 9.2; white count 1.9 ANC 0.  Hold off platelet transfusion today; plan but end of the week.  # Mild nausea abdominal cramping diarrhea-question lactose intolerance versus Dacogen.  Currently improved  # Anti-biotic prophylaxis-given severe neutropenia-recommend levofloxacin 500 milligrams a day/ acyclovir/dilfucan.  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 On Femara. PET- significant response noted.   #Continue labs Mondays and Thursdays; with possible transfusion the day after.  # follow up in 2 weeks/MD/dacogen 1-5.      [name], MD 06/25/2017 8:17 AM

## 2017-06-24 NOTE — Assessment & Plan Note (Addendum)
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  Patient currently on Dacogen 20 mg/m square day 1 through 5 every 4 weeks; patient tolerated treatment fairly well except for mild abdominal cramping/diarrhea [see discussion below]  # cycle # 1day- 15 today; Today platelets are 15; hemoglobin 9.2; white count 1.9 ANC 0.  Hold off platelet transfusion today; plan but end of the week.  # Mild nausea abdominal cramping diarrhea-question lactose intolerance versus Dacogen.  Currently improved  # Anti-biotic prophylaxis-given severe neutropenia-recommend levofloxacin 500 milligrams a day/ acyclovir/dilfucan.  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 On Femara. PET- significant response noted.   #Continue labs Mondays and Thursdays; with possible transfusion the day after.  # follow up in 2 weeks/MD/dacogen 1-5.

## 2017-06-25 ENCOUNTER — Inpatient Hospital Stay: Payer: Medicare Other

## 2017-06-26 ENCOUNTER — Inpatient Hospital Stay: Payer: Medicare Other

## 2017-06-26 ENCOUNTER — Inpatient Hospital Stay (HOSPITAL_BASED_OUTPATIENT_CLINIC_OR_DEPARTMENT_OTHER): Payer: Medicare Other | Admitting: Oncology

## 2017-06-26 ENCOUNTER — Other Ambulatory Visit: Payer: Self-pay | Admitting: *Deleted

## 2017-06-26 ENCOUNTER — Ambulatory Visit: Payer: Medicare Other

## 2017-06-26 ENCOUNTER — Telehealth: Payer: Self-pay | Admitting: *Deleted

## 2017-06-26 ENCOUNTER — Encounter: Payer: Self-pay | Admitting: Internal Medicine

## 2017-06-26 VITALS — BP 129/77 | HR 118 | Temp 98.6°F | Resp 18 | Wt 135.0 lb

## 2017-06-26 DIAGNOSIS — D469 Myelodysplastic syndrome, unspecified: Secondary | ICD-10-CM

## 2017-06-26 DIAGNOSIS — Z923 Personal history of irradiation: Secondary | ICD-10-CM

## 2017-06-26 DIAGNOSIS — J449 Chronic obstructive pulmonary disease, unspecified: Secondary | ICD-10-CM | POA: Diagnosis not present

## 2017-06-26 DIAGNOSIS — T451X5A Adverse effect of antineoplastic and immunosuppressive drugs, initial encounter: Secondary | ICD-10-CM

## 2017-06-26 DIAGNOSIS — Z803 Family history of malignant neoplasm of breast: Secondary | ICD-10-CM

## 2017-06-26 DIAGNOSIS — Z5111 Encounter for antineoplastic chemotherapy: Secondary | ICD-10-CM | POA: Diagnosis not present

## 2017-06-26 DIAGNOSIS — M1990 Unspecified osteoarthritis, unspecified site: Secondary | ICD-10-CM

## 2017-06-26 DIAGNOSIS — Z8614 Personal history of Methicillin resistant Staphylococcus aureus infection: Secondary | ICD-10-CM

## 2017-06-26 DIAGNOSIS — D696 Thrombocytopenia, unspecified: Secondary | ICD-10-CM

## 2017-06-26 DIAGNOSIS — M810 Age-related osteoporosis without current pathological fracture: Secondary | ICD-10-CM | POA: Diagnosis not present

## 2017-06-26 DIAGNOSIS — K219 Gastro-esophageal reflux disease without esophagitis: Secondary | ICD-10-CM

## 2017-06-26 DIAGNOSIS — I10 Essential (primary) hypertension: Secondary | ICD-10-CM | POA: Diagnosis not present

## 2017-06-26 DIAGNOSIS — Z170 Estrogen receptor positive status [ER+]: Secondary | ICD-10-CM

## 2017-06-26 DIAGNOSIS — D701 Agranulocytosis secondary to cancer chemotherapy: Secondary | ICD-10-CM

## 2017-06-26 DIAGNOSIS — C50911 Malignant neoplasm of unspecified site of right female breast: Secondary | ICD-10-CM

## 2017-06-26 DIAGNOSIS — H00014 Hordeolum externum left upper eyelid: Secondary | ICD-10-CM | POA: Diagnosis not present

## 2017-06-26 DIAGNOSIS — Z9221 Personal history of antineoplastic chemotherapy: Secondary | ICD-10-CM

## 2017-06-26 DIAGNOSIS — D649 Anemia, unspecified: Secondary | ICD-10-CM

## 2017-06-26 DIAGNOSIS — Z79811 Long term (current) use of aromatase inhibitors: Secondary | ICD-10-CM | POA: Diagnosis not present

## 2017-06-26 DIAGNOSIS — F17210 Nicotine dependence, cigarettes, uncomplicated: Secondary | ICD-10-CM

## 2017-06-26 DIAGNOSIS — C7801 Secondary malignant neoplasm of right lung: Secondary | ICD-10-CM | POA: Diagnosis not present

## 2017-06-26 LAB — CBC WITH DIFFERENTIAL/PLATELET
BASOS ABS: 0 10*3/uL (ref 0–0.1)
BASOS PCT: 0 %
EOS PCT: 0 %
Eosinophils Absolute: 0 10*3/uL (ref 0–0.7)
HEMATOCRIT: 24.9 % — AB (ref 35.0–47.0)
Hemoglobin: 8.7 g/dL — ABNORMAL LOW (ref 12.0–16.0)
Lymphocytes Relative: 98 %
Lymphs Abs: 0.9 10*3/uL — ABNORMAL LOW (ref 1.0–3.6)
MCH: 29.8 pg (ref 26.0–34.0)
MCHC: 35.1 g/dL (ref 32.0–36.0)
MCV: 84.8 fL (ref 80.0–100.0)
MONO ABS: 0 10*3/uL — AB (ref 0.2–0.9)
Monocytes Relative: 1 %
NEUTROS ABS: 0 10*3/uL — AB (ref 1.4–6.5)
Neutrophils Relative %: 1 %
PLATELETS: 6 10*3/uL — AB (ref 150–400)
RBC: 2.93 MIL/uL — AB (ref 3.80–5.20)
RDW: 13.8 % (ref 11.5–14.5)
WBC: 0.9 10*3/uL — AB (ref 3.6–11.0)

## 2017-06-26 LAB — SAMPLE TO BLOOD BANK

## 2017-06-26 MED ORDER — ERYTHROMYCIN 5 MG/GM OP OINT: 1.0000 "application " | TOPICAL_OINTMENT | Freq: Every day | OPHTHALMIC | 0 refills | Status: AC

## 2017-06-26 NOTE — Telephone Encounter (Signed)
Patient called reporting that she has an eye infection and asking if she can be seen here or if she should go to her eye doctor. She states her ;left eye is swollen and red and painful. It started yesterday. Per Lorretta Harp, NP see patient in our clinic. Patient accepts appointment for 130 today

## 2017-06-26 NOTE — Progress Notes (Signed)
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 Rogue Bussing on 06/24/2017 for follow-up after initiating new chemotherapy. She recently started cycle 1 Dacogen (cycle 1 day 1 (06/10/17), cycle 1 day 2 (06/11/17), cycle 1 day 3 (06/12/17), cycle 1 day 4 (06/13/17), cycle 1 day 5 (06/14/17). At that time she was noted to have mild abdominal discomfort with some diarrhea and post treatment nausea. Her diet was modified to exclude lactose. She had been requiring weekly platelet transfusions and monthly packed red blood cell transfusions. She continued to take Femara and denied body aches or joint aches. She was given prophylactic antibiotics, antivirals and antifungal ( Levaquin 500 mg daily, acyclovir and Diflucan) given her severe neutropenia.  Patient initially diagnosed in April 2018 of right breast cancer by left chest wall biopsy. She was found to have stage IV breast cancer with a 1 cm right lung nodule. In May 2018 she was started on Femara and Verzenio. Verzenio was d/c was due to severe cytopenia.   Several bone marrows have been conducted and have and continue to be nonconclusive for any type of blood disorder. Most recent was in January 2019.   Severe thrombocytopenia: Patient was started on N-plate in May 9629 with suboptimal improvement. Started on Promacta in November 2018 with an increase in December 2018. Currently on 150 mg  daily a Promacta.   Interval history- Shelly Arias is a 72 y.o. female who presents for evaluation of erythema and pain in the left eye. She has noticed the above symptoms for 1 day.  Onset was acute.  Patient denies blurred vision, discharge, foreign body sensation, itching and tearing.  There is no previous history.  She has tried an treatment for her eye.   ECOG FS:1 - Symptomatic but completely ambulatory  Review of systems- Review of Systems  Constitutional: Negative.  Negative for chills, fever, malaise/fatigue and weight loss.  HENT: Negative for congestion and ear pain.   Eyes: Positive for pain and redness. Negative for blurred vision and double vision.       Left eye  Respiratory: Negative.  Negative for cough, sputum production and shortness of breath.   Cardiovascular: Negative.  Negative for chest pain, palpitations and leg swelling.  Gastrointestinal: Negative.  Negative for abdominal pain, constipation, diarrhea, nausea and vomiting.  Genitourinary: Negative for dysuria, frequency and urgency.  Musculoskeletal: Negative for back pain and falls.  Skin: Negative.  Negative for rash.  Neurological: Negative.  Negative for weakness and headaches.  Endo/Heme/Allergies: Negative.  Does not bruise/bleed easily.  Psychiatric/Behavioral: Negative.  Negative for depression. The patient is not nervous/anxious and does not have insomnia.    Current treatment- Dacogen  Allergies  Allergen Reactions  . Codeine Anaphylaxis  . Fish-Derived Products Anaphylaxis  . Morphine Sulfate Anaphylaxis    REACTION: anaphylaxis  . Adhesive [Tape]     PAPER TAPE OK TO USE  . Oxycodone     Nausea and vomiting     Past Medical  History:  Diagnosis Date  . Blood dyscrasia   . Breast cancer ([place])    right, lumpectomy, radiation, chemo  . Breast cancer ([place])    Right, 2007  . Breast cancer ([place]) 06/20/2016   INVASIVE DUCTAL CARCINOMA.  . Collapsed lung 02/14/2017   RIGHT  .  Complication of anesthesia    PT STATED AFTER BIL HIP SURGERIES SHE STOPPED BREATHING THE NIGHT AFTER SURGERY  . COPD (chronic obstructive pulmonary disease) ([place])   . GERD (gastroesophageal reflux disease)   . Headache   . History of chemotherapy   . History of methicillin resistant staphylococcus aureus (MRSA) 2011  . History of radiation therapy   . Hyperlipidemia   . Hypertension   . Liver cancer ([place]) 05/2016  . Lung cancer ([place]) 05/2016  . Osteoarthritis    right hip  . Osteoporosis   . Squamous cell carcinoma    leg, Followed by Dr. Nicole Kindred     Past Surgical History:  Procedure Laterality Date  . ABDOMINAL HYSTERECTOMY    . BREAST BIOPSY Left 2002   left breast, calcifications  . BREAST BIOPSY Left 06/20/2016   INVASIVE DUCTAL CARCINOMA.  Marland Kitchen BREAST EXCISIONAL BIOPSY Right 2007   positive  . bunion repair    . ESOPHAGOGASTRODUODENOSCOPY (EGD) WITH PROPOFOL N/A 08/05/2016   Procedure: ESOPHAGOGASTRODUODENOSCOPY (EGD) WITH PROPOFOL;  Surgeon: San Jetty, MD;  Location: [facility] ENDOSCOPY;  Service: General;  Laterality: N/A;  . JOINT REPLACEMENT    . left breast biopsy    . NASAL SINUS SURGERY    . PORTACATH PLACEMENT Right 04/13/2017   Procedure: INSERTION PORT-A-CATH- RIGHT INTERNAL JUGULAR;  Surgeon: Robert Bellow, MD;  Location: [facility] ORS;  Service: General;  Laterality: Right;  . right hip replacement    . SHOULDER SURGERY    . SQUAMOUS CELL CARCINOMA EXCISION     right leg, Dr. Nicole Kindred  . TOTAL HIP ARTHROPLASTY     right    Social History   Socioeconomic History  . Marital status: Married    Spouse name: Not on file  . Number of children: Not on file  . Years of education: Not on file  . Highest education level: Not on file  Occupational History  . Not on file  Social Needs  . Financial resource strain: Not hard at all  . Food insecurity:    Worry: Not on file    Inability: Not on file  . Transportation needs:    Medical: No    Non-medical: No    Tobacco Use  . Smoking status: Current Some Day Smoker    Packs/day: 0.10    Years: 30.00    Pack years: 3.00    Types: Cigarettes  . Smokeless tobacco: Never Used  Substance and Sexual Activity  . Alcohol use: Yes    Alcohol/week: 0.0 oz    Comment: socially  . Drug use: No  . Sexual activity: Never  Lifestyle  . Physical activity:    Days per week: Not on file    Minutes per session: Not on file  . Stress: Not on file  Relationships  . Social connections:    Talks on phone: Not on file    Gets together: Not on file    Attends religious service: Not on file    Active member of club or organization: Not on file    Attends meetings of clubs or organizations: Not on file    Relationship status: Not on file  . Intimate partner violence:  Fear of current or ex partner: Not on file    Emotionally abused: Not on file    Physically abused: Not on file    Forced sexual activity: Not on file  Other Topics Concern  . Not on file  Social History Narrative  . Not on file    Family History  Problem Relation Age of Onset  . Heart disease Father 59  . Heart disease Mother   . Hyperlipidemia Mother   . Heart disease Brother   . Diabetes Brother   . Diabetes Maternal Grandmother   . Breast cancer Cousin   . Kidney disease Brother      Current Outpatient Medications:  .  acyclovir (ZOVIRAX) 400 MG tablet, Take 1 tablet (400 mg total) by mouth 2 (two) times daily., Disp: 60 tablet, Rfl: 3 .  ALPRAZolam (XANAX) 0.5 MG tablet, TAKE 1 TABLET BY MOUTH EVERY 8 HOURS AS NEEDED FOR ANXIETY OR SLEEP (Patient taking differently: take 1 tablet by mouth at bedtime), Disp: 30 tablet, Rfl: 2 .  cyanocobalamin (,VITAMIN B-12,) 1000 MCG/ML injection, Inject 1,000 mcg into the muscle every 30 (thirty) days., Disp: , Rfl:  .  ergocalciferol (VITAMIN D2) 50000 units capsule, Take 1 capsule (50,000 Units total) by mouth every Saturday. In the morning., Disp: 12 capsule, Rfl: 0 .  fexofenadine  (ALLEGRA) 180 MG tablet, Take 180 mg by mouth every morning. , Disp: , Rfl:  .  fluconazole (DIFLUCAN) 200 MG tablet, Take 1 tablet (200 mg total) by mouth daily., Disp: 30 tablet, Rfl: 3 .  Glucosamine-Chondroitin (COSAMIN DS PO), Take 1 tablet by mouth daily. In the morning., Disp: , Rfl:  .  letrozole (FEMARA) 2.5 MG tablet, Take 1 tablet (2.5 mg total) by mouth daily. Once a day., Disp: 90 tablet, Rfl: 0 .  levofloxacin (LEVAQUIN) 500 MG tablet, Take 1 tablet (500 mg total) by mouth daily., Disp: 30 tablet, Rfl: 3 .  lidocaine-prilocaine (EMLA) cream, Apply 1 application topically as needed. Apply generously over the Mediport 45 minutes prior to chemotherapy., Disp: 30 g, Rfl: 0 .  metoprolol succinate (TOPROL-XL) 25 MG 24 hr tablet, Take 1 tablet (25 mg total) by mouth daily., Disp: 90 tablet, Rfl: 3 .  neomycin-polymyxin-pramoxine (NEOSPORIN PLUS) 1 % cream, Apply 1 application topically 2 (two) times daily as needed (Nasal passages as needed for dryness.)., Disp: , Rfl:  .  ondansetron (ZOFRAN) 8 MG tablet, Take 1 tablet by mouth every 8 (eight) hours as needed for nausea/vomiting., Disp: , Rfl:  .  pravastatin (PRAVACHOL) 40 MG tablet, Take 1 tablet (40 mg total) by mouth daily., Disp: 90 tablet, Rfl: 3 .  Probiotic Product (PROBIOTIC & ACIDOPHILUS EX ST PO), Take 1 tablet by mouth daily. , Disp: , Rfl:  .  ranitidine (ZANTAC) 75 MG tablet, Take 75 mg by mouth as needed. , Disp: , Rfl:  .  vitamin B-12 (CYANOCOBALAMIN) 1000 MCG tablet, Take 1,000 mcg by mouth daily. , Disp: , Rfl:  .  erythromycin ophthalmic ointment, Place 1 application into the left eye at bedtime for 7 days., Disp: 7 g, Rfl: 0  Physical exam:  Vitals:   06/26/17 1347  BP: 129/77  Pulse: (!) 118  Resp: 18  Temp: 98.6 F (37 C)  TempSrc: Tympanic  SpO2: 99%  Weight: 135 lb (61.2 kg)   Physical Exam  Constitutional: She is oriented to person, place, and time. Vital signs are normal.  HENT:  Head: Normocephalic  and atraumatic.  Eyes: Pupils  are equal, round, and reactive to light. Left eye exhibits hordeolum. Left conjunctiva is injected.    Neck: Normal range of motion.  Cardiovascular: Normal rate, regular rhythm and normal heart sounds.  No murmur heard. Pulmonary/Chest: Effort normal and breath sounds normal. She has no wheezes.  Abdominal: Soft. Normal appearance and bowel sounds are normal. She exhibits no distension. There is no tenderness.  Musculoskeletal: Normal range of motion. She exhibits no edema.  Neurological: She is alert and oriented to person, place, and time.  Skin: Skin is warm and dry. No rash noted. There is erythema.  Psychiatric: Judgment normal.            CMP Latest Ref Rng & Units 06/10/2017  Glucose 65 - 99 mg/dL 116(H)  BUN 6 - 20 mg/dL 14  Creatinine 0.44 - 1.00 mg/dL 0.70  Sodium 135 - 145 mmol/L 135  Potassium 3.5 - 5.1 mmol/L 4.3  Chloride 101 - 111 mmol/L 102  CO2 22 - 32 mmol/L 26  Calcium 8.9 - 10.3 mg/dL 9.1  Total Protein 6.5 - 8.1 g/dL 6.2(L)  Total Bilirubin 0.3 - 1.2 mg/dL 0.7  Alkaline Phos 38 - 126 U/L 71  AST 15 - 41 U/L 18  ALT 14 - 54 U/L 17   CBC Latest Ref Rng & Units 06/26/2017  WBC 3.6 - 11.0 K/uL 0.9(LL)  Hemoglobin 12.0 - 16.0 g/dL 8.7(L)  Hematocrit 35.0 - 47.0 % 24.9(L)  Platelets 150 - 400 K/uL 6(LL)    No images are attached to the encounter.  Nm Pet Image Restag (ps) Skull Base To Thigh  Result Date: 06/03/2017 CLINICAL DATA:  Subsequent treatment strategy for right breast cancer. EXAM: NUCLEAR MEDICINE PET SKULL BASE TO THIGH TECHNIQUE: 7.8 mCi F-18 FDG was injected intravenously. Full-ring PET imaging was performed from the skull base to thigh after the radiotracer. CT data was obtained and used for attenuation correction and anatomic localization. Fasting blood glucose: 56 mg/dl Mediastinal blood pool activity: SUV max 2.6 COMPARISON:  CT chest 02/25/2017 and PET 10/19/2016. FINDINGS: NECK: No hypermetabolic lymph  nodes. Incidental CT findings: None. CHEST: No hypermetabolic mediastinal, hilar, internal mammary or axillary adenopathy. No hypermetabolic pulmonary nodules. Linear density in the anterior segment right upper lobe (series 3, image 87), unchanged from 02/25/2017 and too small for PET resolution. Incidental CT findings: Right IJ power port terminates at the SVC RA junction. Atherosclerotic calcification of the arterial vasculature, including coronary arteries. No pericardial or pleural effusion. A few scattered millimetric pulmonary nodules are too small for PET resolution. ABDOMEN/PELVIS: No abnormal hypermetabolism in the liver, adrenal glands, spleen or pancreas. No hypermetabolic lymph nodes. Incidental CT findings: Small stone in the gallbladder. Atherosclerotic calcification of the arterial vasculature without abdominal aortic aneurysm. No free fluid. SKELETON: No abnormal osseous hypermetabolism. Incidental CT findings: Bilateral hip arthroplasties. Degenerative changes in the spine. IMPRESSION: 1. No evidence of hypermetabolic metastatic disease. A linear density in the anterior segment right upper lobe is unchanged morphologically from 02/25/2017 and is too small for PET resolution. 2. Aortic atherosclerosis (ICD10-170.0). Coronary artery calcification. 3. Cholelithiasis. Electronically Signed   By: Lorin Picket M.D.   On: 06/03/2017 11:02     Assessment and plan- Patient is a 72 y.o. female who presents with left eye erythema and pain.  1. Right breast cancer/MDS: Patient currently receiving Dacogen days 1- 5 every 28 days. Status post cycle 1. Tolerating well except for severe pancytopenia. Patient frequently having labs drawn for close monitoring. Recently started on prophylactic  antibiotics, antiviral and antifungal on Monday. Patient is scheduled to return to clinic tomorrow for labs and possible platelet transfusion on Friday.  2. Hordeolum of left eye: Ecchymosis and Erythema noted of left  eyelid and conjunctiva. Painful to touch. No drainage present. Small fluctuant whitehead present on eyelash. Typical approach is managed with warm compresses and massage and gentle wiping of the affected eyelid. Unfortunately, patient is severely pancytopenic with no immune system, so we will also offer her a topical antibiotics. A prescription for erythromycin ointment daily for 7 days is ordered and sent to pharmacy. Patient will return to clinic on Friday for possible platelet transfusion and labs. We will reevaluate at that time.   Visit Diagnosis 1. Hordeolum externum of left upper eyelid   2. Thrombocytopenia ([place])   3. Chemotherapy-induced neutropenia ([facility])     Patient expressed understanding and was in agreement with this plan. She also understands that She can call clinic at any time with any questions, concerns, or complaints.   Greater than 50% was spent in counseling and coordination of care with this patient including but not limited to discussion of the relevant topics above (See A&P) including, but not limited to diagnosis and management of acute and chronic medical conditions.    Faythe Casa, AGNP-C [place] at [facility] Cell phone:  06/28/2017 9:33 AM

## 2017-06-27 ENCOUNTER — Inpatient Hospital Stay: Payer: Medicare Other

## 2017-06-27 ENCOUNTER — Ambulatory Visit: Payer: Medicare Other

## 2017-06-28 ENCOUNTER — Inpatient Hospital Stay: Payer: Medicare Other

## 2017-06-28 ENCOUNTER — Ambulatory Visit: Payer: Medicare Other | Admitting: Family Medicine

## 2017-06-28 ENCOUNTER — Encounter: Payer: Self-pay | Admitting: Oncology

## 2017-06-28 DIAGNOSIS — D696 Thrombocytopenia, unspecified: Secondary | ICD-10-CM

## 2017-06-28 DIAGNOSIS — Z79811 Long term (current) use of aromatase inhibitors: Secondary | ICD-10-CM | POA: Diagnosis not present

## 2017-06-28 DIAGNOSIS — C50911 Malignant neoplasm of unspecified site of right female breast: Secondary | ICD-10-CM | POA: Diagnosis not present

## 2017-06-28 DIAGNOSIS — Z5111 Encounter for antineoplastic chemotherapy: Secondary | ICD-10-CM | POA: Diagnosis not present

## 2017-06-28 DIAGNOSIS — C7801 Secondary malignant neoplasm of right lung: Secondary | ICD-10-CM | POA: Diagnosis not present

## 2017-06-28 DIAGNOSIS — D469 Myelodysplastic syndrome, unspecified: Secondary | ICD-10-CM | POA: Diagnosis not present

## 2017-06-28 DIAGNOSIS — Z170 Estrogen receptor positive status [ER+]: Secondary | ICD-10-CM | POA: Diagnosis not present

## 2017-06-28 MED ORDER — DIPHENHYDRAMINE HCL 25 MG PO CAPS
25.0000 mg | ORAL_CAPSULE | Freq: Once | ORAL | Status: AC
  Administered 2017-06-28: 25 mg via ORAL
  Filled 2017-06-28: qty 1

## 2017-06-28 MED ORDER — SODIUM CHLORIDE 0.9% FLUSH
10.0000 mL | INTRAVENOUS | Status: AC | PRN
  Filled 2017-06-28: qty 10

## 2017-06-28 MED ORDER — SODIUM CHLORIDE 0.9 % IV SOLN
INTRAVENOUS | Status: AC
  Administered 2017-06-28: 14:00:00 via INTRAVENOUS
  Filled 2017-06-28: qty 1000

## 2017-06-28 MED ORDER — METHYLPREDNISOLONE SODIUM SUCC 125 MG IJ SOLR
40.0000 mg | Freq: Once | INTRAMUSCULAR | Status: AC
  Administered 2017-06-28: 40 mg via INTRAVENOUS
  Filled 2017-06-28: qty 2

## 2017-06-28 MED ORDER — ACETAMINOPHEN 325 MG PO TABS
650.0000 mg | ORAL_TABLET | Freq: Once | ORAL | Status: AC
  Administered 2017-06-28: 650 mg via ORAL
  Filled 2017-06-28: qty 2

## 2017-06-28 MED ORDER — HEPARIN SOD (PORK) LOCK FLUSH 100 UNIT/ML IV SOLN
500.0000 [IU] | Freq: Once | INTRAVENOUS | Status: AC
  Administered 2017-06-28: 500 [IU] via INTRAVENOUS
  Filled 2017-06-28: qty 5

## 2017-06-28 NOTE — Progress Notes (Signed)
Patient reevaluated in infusion today. She was here to receive 1 unit of platelets. Left eye appears more ecchymotic but "less painful" per the patient. She has been diligent about cleaning eye daily and applying hot compresses per the pharmacist. Patient is on prophylactic antibiotic, antiviral and antifungal for current therapy. She has remained afebrile. Stye appears less red And is almost completely resolved.  Instructed patient to continue with erythromycin ointment but to stop warm compresses given bruising appears to be spreading. Not surprising given platelet count is 6 today. Try ice compresses instead. Patient will be reevaluated on Monday.  Faythe Casa, NP 06/28/2017 3:08 PM

## 2017-06-29 LAB — BPAM PLATELET PHERESIS
BLOOD PRODUCT EXPIRATION DATE: 201904191430
ISSUE DATE / TIME: 201904191348
Unit Type and Rh: 5100

## 2017-06-29 LAB — PREPARE PLATELET PHERESIS: UNIT DIVISION: 0

## 2017-07-01 ENCOUNTER — Telehealth: Payer: Self-pay | Admitting: *Deleted

## 2017-07-01 ENCOUNTER — Inpatient Hospital Stay: Payer: Medicare Other

## 2017-07-01 ENCOUNTER — Other Ambulatory Visit: Payer: Self-pay | Admitting: *Deleted

## 2017-07-01 DIAGNOSIS — D469 Myelodysplastic syndrome, unspecified: Secondary | ICD-10-CM | POA: Diagnosis not present

## 2017-07-01 DIAGNOSIS — D649 Anemia, unspecified: Secondary | ICD-10-CM

## 2017-07-01 DIAGNOSIS — Z79811 Long term (current) use of aromatase inhibitors: Secondary | ICD-10-CM | POA: Diagnosis not present

## 2017-07-01 DIAGNOSIS — C7801 Secondary malignant neoplasm of right lung: Secondary | ICD-10-CM | POA: Diagnosis not present

## 2017-07-01 DIAGNOSIS — C50911 Malignant neoplasm of unspecified site of right female breast: Secondary | ICD-10-CM | POA: Diagnosis not present

## 2017-07-01 DIAGNOSIS — Z170 Estrogen receptor positive status [ER+]: Secondary | ICD-10-CM | POA: Diagnosis not present

## 2017-07-01 DIAGNOSIS — Z5111 Encounter for antineoplastic chemotherapy: Secondary | ICD-10-CM | POA: Diagnosis not present

## 2017-07-01 LAB — CBC WITH DIFFERENTIAL/PLATELET
BASOS PCT: 0 %
Basophils Absolute: 0 10*3/uL (ref 0–0.1)
EOS ABS: 0 10*3/uL (ref 0–0.7)
Eosinophils Relative: 0 %
HCT: 21 % — ABNORMAL LOW (ref 35.0–47.0)
HEMOGLOBIN: 7.5 g/dL — AB (ref 12.0–16.0)
LYMPHS ABS: 1.1 10*3/uL (ref 1.0–3.6)
Lymphocytes Relative: 99 %
MCH: 30 pg (ref 26.0–34.0)
MCHC: 35.6 g/dL (ref 32.0–36.0)
MCV: 84.1 fL (ref 80.0–100.0)
MONO ABS: 0 10*3/uL — AB (ref 0.2–0.9)
MONOS PCT: 1 %
Neutro Abs: 0 10*3/uL — ABNORMAL LOW (ref 1.4–6.5)
Neutrophils Relative %: 0 %
Platelets: 36 10*3/uL — ABNORMAL LOW (ref 150–440)
RBC: 2.49 MIL/uL — ABNORMAL LOW (ref 3.80–5.20)
RDW: 13.5 % (ref 11.5–14.5)
WBC: 1.1 10*3/uL — CL (ref 3.6–11.0)

## 2017-07-01 LAB — SAMPLE TO BLOOD BANK

## 2017-07-01 LAB — PREPARE RBC (CROSSMATCH)

## 2017-07-01 NOTE — Telephone Encounter (Signed)
1108- Received ph call from [facility] in cancer center lab. critical anc at 0.0. Read back process performed.   hgb 7.5 and plts  36  I spoke with Dr. Rogue Bussing- He would like the patient to have 1 unit of blood tomorrow.  I left a vm for the patient regarding the plan of care.

## 2017-07-02 ENCOUNTER — Inpatient Hospital Stay: Payer: Medicare Other

## 2017-07-02 DIAGNOSIS — D469 Myelodysplastic syndrome, unspecified: Secondary | ICD-10-CM | POA: Diagnosis not present

## 2017-07-02 DIAGNOSIS — Z5111 Encounter for antineoplastic chemotherapy: Secondary | ICD-10-CM | POA: Diagnosis not present

## 2017-07-02 DIAGNOSIS — C50911 Malignant neoplasm of unspecified site of right female breast: Secondary | ICD-10-CM | POA: Diagnosis not present

## 2017-07-02 DIAGNOSIS — Z170 Estrogen receptor positive status [ER+]: Secondary | ICD-10-CM | POA: Diagnosis not present

## 2017-07-02 DIAGNOSIS — Z79811 Long term (current) use of aromatase inhibitors: Secondary | ICD-10-CM | POA: Diagnosis not present

## 2017-07-02 DIAGNOSIS — C7801 Secondary malignant neoplasm of right lung: Secondary | ICD-10-CM | POA: Diagnosis not present

## 2017-07-02 DIAGNOSIS — D649 Anemia, unspecified: Secondary | ICD-10-CM

## 2017-07-02 MED ORDER — DIPHENHYDRAMINE HCL 25 MG PO CAPS
25.0000 mg | ORAL_CAPSULE | Freq: Once | ORAL | Status: AC
  Administered 2017-07-02: 25 mg via ORAL
  Filled 2017-07-02: qty 1

## 2017-07-02 MED ORDER — ACETAMINOPHEN 325 MG PO TABS
650.0000 mg | ORAL_TABLET | Freq: Once | ORAL | Status: AC
  Administered 2017-07-02: 650 mg via ORAL
  Filled 2017-07-02: qty 2

## 2017-07-02 MED ORDER — HEPARIN SOD (PORK) LOCK FLUSH 100 UNIT/ML IV SOLN
500.0000 [IU] | Freq: Every day | INTRAVENOUS | Status: AC | PRN
  Administered 2017-07-02: 500 [IU]
  Filled 2017-07-02 (×2): qty 5

## 2017-07-02 MED ORDER — SODIUM CHLORIDE 0.9 % IV SOLN
250.0000 mL | Freq: Once | INTRAVENOUS | Status: AC
  Administered 2017-07-02: 250 mL via INTRAVENOUS
  Filled 2017-07-02: qty 250

## 2017-07-03 ENCOUNTER — Ambulatory Visit: Payer: Medicare Other | Admitting: Family Medicine

## 2017-07-03 LAB — BPAM RBC
Blood Product Expiration Date: 201905152359
ISSUE DATE / TIME: 201904231341
UNIT TYPE AND RH: 5100

## 2017-07-03 LAB — TYPE AND SCREEN
ABO/RH(D): O POS
ANTIBODY SCREEN: POSITIVE
Donor AG Type: NEGATIVE
Unit division: 0

## 2017-07-04 ENCOUNTER — Telehealth: Payer: Self-pay | Admitting: *Deleted

## 2017-07-04 ENCOUNTER — Inpatient Hospital Stay: Payer: Medicare Other

## 2017-07-04 ENCOUNTER — Other Ambulatory Visit: Payer: Self-pay

## 2017-07-04 ENCOUNTER — Other Ambulatory Visit: Payer: Self-pay | Admitting: *Deleted

## 2017-07-04 DIAGNOSIS — C7801 Secondary malignant neoplasm of right lung: Secondary | ICD-10-CM | POA: Diagnosis not present

## 2017-07-04 DIAGNOSIS — D469 Myelodysplastic syndrome, unspecified: Secondary | ICD-10-CM | POA: Diagnosis not present

## 2017-07-04 DIAGNOSIS — D696 Thrombocytopenia, unspecified: Secondary | ICD-10-CM

## 2017-07-04 DIAGNOSIS — C50911 Malignant neoplasm of unspecified site of right female breast: Secondary | ICD-10-CM | POA: Diagnosis not present

## 2017-07-04 DIAGNOSIS — J029 Acute pharyngitis, unspecified: Secondary | ICD-10-CM

## 2017-07-04 DIAGNOSIS — Z170 Estrogen receptor positive status [ER+]: Secondary | ICD-10-CM | POA: Diagnosis not present

## 2017-07-04 DIAGNOSIS — Z79811 Long term (current) use of aromatase inhibitors: Secondary | ICD-10-CM | POA: Diagnosis not present

## 2017-07-04 DIAGNOSIS — Z5111 Encounter for antineoplastic chemotherapy: Secondary | ICD-10-CM | POA: Diagnosis not present

## 2017-07-04 LAB — CBC WITH DIFFERENTIAL/PLATELET
BASOS ABS: 0 10*3/uL (ref 0–0.1)
BASOS PCT: 0 %
EOS ABS: 0 10*3/uL (ref 0–0.7)
Eosinophils Relative: 0 %
HCT: 26.3 % — ABNORMAL LOW (ref 35.0–47.0)
HEMOGLOBIN: 9.3 g/dL — AB (ref 12.0–16.0)
LYMPHS ABS: 0.9 10*3/uL — AB (ref 1.0–3.6)
Lymphocytes Relative: 98 %
MCH: 29.4 pg (ref 26.0–34.0)
MCHC: 35.3 g/dL (ref 32.0–36.0)
MCV: 83.2 fL (ref 80.0–100.0)
Monocytes Absolute: 0 10*3/uL — ABNORMAL LOW (ref 0.2–0.9)
Monocytes Relative: 1 %
Neutro Abs: 0 10*3/uL — ABNORMAL LOW (ref 1.4–6.5)
Neutrophils Relative %: 1 %
PLATELETS: 18 10*3/uL — AB (ref 150–400)
RBC: 3.15 MIL/uL — AB (ref 3.80–5.20)
RDW: 14 % (ref 11.5–14.5)
WBC: 0.9 10*3/uL — AB (ref 3.6–11.0)

## 2017-07-04 LAB — SAMPLE TO BLOOD BANK

## 2017-07-04 MED ORDER — MAGIC MOUTHWASH W/LIDOCAINE: 5.0000 mL | Freq: Four times a day (QID) | ORAL | 3 refills | Status: DC

## 2017-07-04 NOTE — Telephone Encounter (Signed)
Spoke with Sonia Baller, NP.  NP approved magic mouthwash rx. Script faxed to pts pharmacy. Sonia Baller agreed to see pt after her plt infusion tom. Patient made aware. Apt added in chl.

## 2017-07-04 NOTE — Telephone Encounter (Signed)
I contacted patient to let her know that she needs 1 unit of plts tom at 1:30 pm. She wanted to know if the NP could evaluate her tomorrow in the infusion suite or if she could come around 11:30am to see the NP. She states that she has an ear ache and her throat is still very sore. Pt has gargled with salt water. She is taking her levaquin as directed but not getting any better. ANC is still 0.0. She has not been exposed to any one with viral like illness or strep. She has not been anywhere but to her apts at the cancer center. She would like NP to look at her ear and throat. She does report that it hurts to swallow. She feels that her glands in her neck are also reactive and swollen. She would like a script for magic mouth swish/swallow if possible to see if this helps her symptoms (please send to [facility]). She declined to come back to [facility] today for an apt with NP.  Pls advise.

## 2017-07-04 NOTE — Addendum Note (Signed)
Addended by: Sabino Gasser on: 07/04/2017 02:07 PM   Modules accepted: Orders

## 2017-07-05 ENCOUNTER — Inpatient Hospital Stay (HOSPITAL_BASED_OUTPATIENT_CLINIC_OR_DEPARTMENT_OTHER): Payer: Medicare Other | Admitting: Oncology

## 2017-07-05 ENCOUNTER — Inpatient Hospital Stay: Payer: Medicare Other

## 2017-07-05 DIAGNOSIS — Z8614 Personal history of Methicillin resistant Staphylococcus aureus infection: Secondary | ICD-10-CM

## 2017-07-05 DIAGNOSIS — D701 Agranulocytosis secondary to cancer chemotherapy: Secondary | ICD-10-CM

## 2017-07-05 DIAGNOSIS — Z170 Estrogen receptor positive status [ER+]: Secondary | ICD-10-CM | POA: Diagnosis not present

## 2017-07-05 DIAGNOSIS — R509 Fever, unspecified: Secondary | ICD-10-CM

## 2017-07-05 DIAGNOSIS — D649 Anemia, unspecified: Secondary | ICD-10-CM | POA: Diagnosis not present

## 2017-07-05 DIAGNOSIS — H00014 Hordeolum externum left upper eyelid: Secondary | ICD-10-CM | POA: Diagnosis not present

## 2017-07-05 DIAGNOSIS — F17210 Nicotine dependence, cigarettes, uncomplicated: Secondary | ICD-10-CM

## 2017-07-05 DIAGNOSIS — K219 Gastro-esophageal reflux disease without esophagitis: Secondary | ICD-10-CM

## 2017-07-05 DIAGNOSIS — C50911 Malignant neoplasm of unspecified site of right female breast: Secondary | ICD-10-CM | POA: Diagnosis not present

## 2017-07-05 DIAGNOSIS — Z79811 Long term (current) use of aromatase inhibitors: Secondary | ICD-10-CM | POA: Diagnosis not present

## 2017-07-05 DIAGNOSIS — D696 Thrombocytopenia, unspecified: Secondary | ICD-10-CM

## 2017-07-05 DIAGNOSIS — D469 Myelodysplastic syndrome, unspecified: Secondary | ICD-10-CM | POA: Diagnosis not present

## 2017-07-05 DIAGNOSIS — H9201 Otalgia, right ear: Secondary | ICD-10-CM

## 2017-07-05 DIAGNOSIS — Z923 Personal history of irradiation: Secondary | ICD-10-CM

## 2017-07-05 DIAGNOSIS — I10 Essential (primary) hypertension: Secondary | ICD-10-CM

## 2017-07-05 DIAGNOSIS — M810 Age-related osteoporosis without current pathological fracture: Secondary | ICD-10-CM

## 2017-07-05 DIAGNOSIS — C7801 Secondary malignant neoplasm of right lung: Secondary | ICD-10-CM

## 2017-07-05 DIAGNOSIS — Z9221 Personal history of antineoplastic chemotherapy: Secondary | ICD-10-CM

## 2017-07-05 DIAGNOSIS — Z5111 Encounter for antineoplastic chemotherapy: Secondary | ICD-10-CM

## 2017-07-05 DIAGNOSIS — Z853 Personal history of malignant neoplasm of breast: Secondary | ICD-10-CM

## 2017-07-05 DIAGNOSIS — T451X5A Adverse effect of antineoplastic and immunosuppressive drugs, initial encounter: Secondary | ICD-10-CM

## 2017-07-05 DIAGNOSIS — J029 Acute pharyngitis, unspecified: Secondary | ICD-10-CM | POA: Diagnosis not present

## 2017-07-05 DIAGNOSIS — J449 Chronic obstructive pulmonary disease, unspecified: Secondary | ICD-10-CM

## 2017-07-05 DIAGNOSIS — M1990 Unspecified osteoarthritis, unspecified site: Secondary | ICD-10-CM

## 2017-07-05 DIAGNOSIS — Z803 Family history of malignant neoplasm of breast: Secondary | ICD-10-CM

## 2017-07-05 MED ORDER — AMOXICILLIN-POT CLAVULANATE 875-125 MG PO TABS: 1.0000 | ORAL_TABLET | Freq: Two times a day (BID) | ORAL | 0 refills | Status: DC

## 2017-07-05 MED ORDER — ACETAMINOPHEN 325 MG PO TABS
650.0000 mg | ORAL_TABLET | Freq: Once | ORAL | Status: AC
  Administered 2017-07-05: 650 mg via ORAL
  Filled 2017-07-05: qty 2

## 2017-07-05 MED ORDER — SODIUM CHLORIDE 0.9% FLUSH
10.0000 mL | INTRAVENOUS | Status: DC | PRN
  Filled 2017-07-05: qty 10

## 2017-07-05 MED ORDER — HEPARIN SOD (PORK) LOCK FLUSH 100 UNIT/ML IV SOLN
500.0000 [IU] | Freq: Every day | INTRAVENOUS | Status: AC | PRN
  Administered 2017-07-05: 500 [IU]
  Filled 2017-07-05: qty 5

## 2017-07-05 MED ORDER — SODIUM CHLORIDE 0.9 % IV SOLN
250.0000 mL | Freq: Once | INTRAVENOUS | Status: AC
  Administered 2017-07-05: 250 mL via INTRAVENOUS
  Filled 2017-07-05: qty 250

## 2017-07-05 MED ORDER — DIPHENHYDRAMINE HCL 25 MG PO CAPS
25.0000 mg | ORAL_CAPSULE | Freq: Once | ORAL | Status: AC
  Administered 2017-07-05: 25 mg via ORAL
  Filled 2017-07-05: qty 1

## 2017-07-05 MED ORDER — METHYLPREDNISOLONE SODIUM SUCC 125 MG IJ SOLR
40.0000 mg | Freq: Once | INTRAMUSCULAR | Status: AC
  Administered 2017-07-05: 40 mg via INTRAVENOUS
  Filled 2017-07-05: qty 2

## 2017-07-05 NOTE — Progress Notes (Signed)
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  She was found to have a hordeolum of left eye and treated with topical antibiotics.  She was given erythromycin ointment for 7 days.  She was also told to apply warm compresses with massage and gentle wiping of the affected eyelid.  She was initially diagnosed in April 2018 with right breast cancer by left chest wall biopsy.  She was found to have stage IV breast cancer with 1 cm right lung nodule.  In May 2018, she was started on Femara and Verzenio.   Verzenio was discontinued due to severe cytopenias  She has had several  bone marrow biopsies that continue to be nonconclusive for any type of blood disorder.  Patient has had severe thrombocytopenia and was started on Nplate in May 0814 with little improvement began Promacta in November 2018 with several increases with again ittle improvement.  Seen by primary medical oncologist Dr. Rogue Bussing on 06/24/2017 for follow-up after initiating cycle 1 of Dacogen.  Tolerated well.  She continued Femara.  She was given prophylactic antibiotics, antivirals and antifungals given her severe neutropenia.  ANC of 0.0.  Interval history-  Patient complains of sore throat and right sided ear pain.  Associated symptoms include fevers up to 100.6 degrees, right ear pain, hoarseness, pain while swallowing, sore throat and  swollen glands. Onset of symptoms was 1 day ago, stable since that time.  She is drinking plenty of fluids.  She has not had recent close exposure to someone with proven streptococcal pharyngitis.   ECOG FS:1 - Symptomatic but completely ambulatory  Review of systems- Review of Systems  Constitutional: Positive for fever and malaise/fatigue. Negative for chills and weight loss.  HENT: Positive for ear pain (Right) and sore throat. Negative for congestion.   Eyes: Negative.  Negative for blurred vision and double vision.  Respiratory: Negative.  Negative for cough, sputum production and shortness of breath.   Cardiovascular: Negative.  Negative for chest pain, palpitations and leg swelling.  Gastrointestinal: Negative.  Negative for abdominal pain, constipation, diarrhea, nausea and vomiting.  Genitourinary: Negative for dysuria, frequency and urgency.  Musculoskeletal: Negative for back pain and falls.  Skin: Negative.  Negative for rash.  Neurological: Negative.  Negative for weakness and headaches.  Endo/Heme/Allergies: Negative.  Does not bruise/bleed easily.  Psychiatric/Behavioral: Negative.  Negative for depression. The patient is not nervous/anxious and does not have insomnia.      Current treatment- S/p Cycle 1 Dacogen Day 1-5 (last 06/14/17)  Allergies  Allergen Reactions  . Codeine Anaphylaxis  . Fish-Derived Products Anaphylaxis  . Morphine Sulfate Anaphylaxis    REACTION: anaphylaxis  . Adhesive [Tape]     PAPER TAPE OK TO USE  . Oxycodone     Nausea and vomiting     Past Medical History:  Diagnosis Date  . Blood dyscrasia   . Breast cancer ([place])    right, lumpectomy, radiation, chemo  . Breast cancer ([place])  Right, 2007  . 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  . Collapsed lung 02/14/2017   RIGHT  . Complication of anesthesia    PT STATED AFTER BIL HIP SURGERIES SHE STOPPED BREATHING THE NIGHT AFTER SURGERY  . COPD (chronic  obstructive pulmonary disease) ([place])   . GERD (gastroesophageal reflux disease)   . Headache   . History of chemotherapy   . History of methicillin resistant staphylococcus aureus (MRSA) 2011  . History of radiation therapy   . Hyperlipidemia   . Hypertension   . Liver cancer ([place]) 05/2016  . Lung cancer ([place]) 05/2016  . Osteoarthritis    right hip  . Osteoporosis   . Squamous cell carcinoma    leg, Followed by Dr. Nicole Kindred     Past Surgical History:  Procedure Laterality Date  . ABDOMINAL HYSTERECTOMY    . BREAST BIOPSY Left 2002   left breast, calcifications  . BREAST BIOPSY Left 06/20/2016   INVASIVE DUCTAL CARCINOMA.  Marland Kitchen BREAST EXCISIONAL BIOPSY Right 2007   positive  . bunion repair    . ESOPHAGOGASTRODUODENOSCOPY (EGD) WITH PROPOFOL N/A 08/05/2016   Procedure: ESOPHAGOGASTRODUODENOSCOPY (EGD) WITH PROPOFOL;  Surgeon: San Jetty, MD;  Location: [facility];  Service: General;  Laterality: N/A;  . JOINT REPLACEMENT    . left breast biopsy    . NASAL SINUS SURGERY    . PORTACATH PLACEMENT Right 04/13/2017   Procedure: INSERTION PORT-A-CATH- RIGHT INTERNAL JUGULAR;  Surgeon: Robert Bellow, MD;  Location: [facility] ORS;  Service: General;  Laterality: Right;  . right hip replacement    . SHOULDER SURGERY    . SQUAMOUS CELL CARCINOMA EXCISION     right leg, Dr. Nicole Kindred  . TOTAL HIP ARTHROPLASTY     right    Social History   Socioeconomic History  . Marital status: Married    Spouse name: Not on file  . Number of children: Not on file  . Years of education: Not on file  . Highest education level: Not on file  Occupational History  . Not on file  Social Needs  . Financial resource strain: Not hard at all  . Food insecurity:    Worry: Not on file    Inability: Not on file  . Transportation needs:    Medical: No    Non-medical: No  Tobacco Use  . Smoking status: Current Some Day Smoker    Packs/day: 0.10    Years: 30.00    Pack years: 3.00    Types:  Cigarettes  . Smokeless tobacco: Never Used  Substance and Sexual Activity  . Alcohol use: Yes    Alcohol/week: 0.0 oz    Comment: socially  . Drug use: No  . Sexual activity: Never  Lifestyle  . Physical activity:    Days per week: Not on file    Minutes per session: Not on file  . Stress: Not on file  Relationships  . Social connections:    Talks on phone: Not on file    Gets together: Not on file    Attends religious service: Not on file    Active member of club or organization: Not on file    Attends meetings of clubs or organizations: Not on file    Relationship status: Not on file  . Intimate partner violence:    Fear of current or ex partner: Not on file    Emotionally abused: Not on file    Physically abused: Not on file  Forced sexual activity: Not on file  Other Topics Concern  . Not on file  Social History Narrative  . Not on file    Family History  Problem Relation Age of Onset  . Heart disease Father 2  . Heart disease Mother   . Hyperlipidemia Mother   . Heart disease Brother   . Diabetes Brother   . Diabetes Maternal Grandmother   . Breast cancer Cousin   . Kidney disease Brother      Current Outpatient Medications:  .  acyclovir (ZOVIRAX) 400 MG tablet, Take 1 tablet (400 mg total) by mouth 2 (two) times daily., Disp: 60 tablet, Rfl: 3 .  ALPRAZolam (XANAX) 0.5 MG tablet, TAKE 1 TABLET BY MOUTH EVERY 8 HOURS AS NEEDED FOR ANXIETY OR SLEEP (Patient not taking: Reported on 07/08/2017), Disp: 30 tablet, Rfl: 2 .  amoxicillin-clavulanate (AUGMENTIN) 875-125 MG tablet, Take 1 tablet by mouth 2 (two) times daily., Disp: 14 tablet, Rfl: 0 .  cyanocobalamin (,VITAMIN B-12,) 1000 MCG/ML injection, Inject 1,000 mcg into the muscle every 30 (thirty) days., Disp: , Rfl:  .  ergocalciferol (VITAMIN D2) 50000 units capsule, Take 1 capsule (50,000 Units total) by mouth every Saturday. In the morning., Disp: 12 capsule, Rfl: 0 .  fexofenadine (ALLEGRA) 180 MG  tablet, Take 180 mg by mouth every morning. , Disp: , Rfl:  .  fluconazole (DIFLUCAN) 200 MG tablet, Take 1 tablet (200 mg total) by mouth daily., Disp: 30 tablet, Rfl: 3 .  Glucosamine-Chondroitin (COSAMIN DS PO), Take 1 tablet by mouth daily. In the morning., Disp: , Rfl:  .  letrozole (FEMARA) 2.5 MG tablet, Take 1 tablet (2.5 mg total) by mouth daily. Once a day., Disp: 90 tablet, Rfl: 0 .  lidocaine-prilocaine (EMLA) cream, Apply 1 application topically as needed. Apply generously over the Mediport 45 minutes prior to chemotherapy., Disp: 30 g, Rfl: 0 .  magic mouthwash w/lidocaine SOLN, Take 5 mLs by mouth 4 (four) times daily., Disp: 480 mL, Rfl: 3 .  metoprolol succinate (TOPROL-XL) 25 MG 24 hr tablet, Take 1 tablet (25 mg total) by mouth daily., Disp: 90 tablet, Rfl: 3 .  neomycin-polymyxin-pramoxine (NEOSPORIN PLUS) 1 % cream, Apply 1 application topically 2 (two) times daily as needed (Nasal passages as needed for dryness.)., Disp: , Rfl:  .  ondansetron (ZOFRAN) 8 MG tablet, Take 1 tablet by mouth every 8 (eight) hours as needed for nausea/vomiting., Disp: , Rfl:  .  pravastatin (PRAVACHOL) 40 MG tablet, Take 1 tablet (40 mg total) by mouth daily., Disp: 90 tablet, Rfl: 3 .  Probiotic Product (PROBIOTIC & ACIDOPHILUS EX ST PO), Take 1 tablet by mouth daily. , Disp: , Rfl:  .  ranitidine (ZANTAC) 75 MG tablet, Take 75 mg by mouth as needed. , Disp: , Rfl:  .  vitamin B-12 (CYANOCOBALAMIN) 1000 MCG tablet, Take 1,000 mcg by mouth daily. , Disp: , Rfl:  No current facility-administered medications for this visit.   Facility-Administered Medications Ordered in Other Visits:  .  0.9 %  sodium chloride infusion, , Intravenous, Continuous, [name], MD, Stopped at 06/28/17 1450 .  heparin lock flush 100 unit/mL, 500 Units, Intravenous, Once, Brahmanday, Lenetta Quaker R, MD .  sodium chloride flush (NS) 0.9 % injection 10 mL, 10 mL, Intravenous, PRN, [name], MD  Physical  exam: There were no vitals filed for this visit. Physical Exam  Constitutional: She is oriented to person, place, and time. Vital signs are normal.  HENT:  Head: Normocephalic and atraumatic.  Right Ear: Tympanic membrane and ear canal normal. No drainage, swelling or tenderness. No middle ear effusion.  Left Ear: Tympanic membrane and ear canal normal.  Mouth/Throat: Mucous membranes are normal. Oropharyngeal exudate present.  Eyes: Pupils are equal, round, and reactive to light.  Neck: Normal range of motion.  Cardiovascular: Normal rate, regular rhythm and normal heart sounds.  No murmur heard. Pulmonary/Chest: Effort normal and breath sounds normal. She has no wheezes.  Abdominal: Soft. Normal appearance and bowel sounds are normal. She exhibits no distension. There is no tenderness.  Musculoskeletal: Normal range of motion. She exhibits no edema.  Lymphadenopathy:       Head (right side): No tonsillar adenopathy present.       Head (left side): No tonsillar adenopathy present.  No tonsillar adenopathy but tenderness noted during palpation.  Neurological: She is alert and oriented to person, place, and time.  Skin: Skin is warm and dry. No rash noted.  Psychiatric: Judgment normal.     CMP Latest Ref Rng & Units 06/10/2017  Glucose 65 - 99 mg/dL 116(H)  BUN 6 - 20 mg/dL 14  Creatinine 0.44 - 1.00 mg/dL 0.70  Sodium 135 - 145 mmol/L 135  Potassium 3.5 - 5.1 mmol/L 4.3  Chloride 101 - 111 mmol/L 102  CO2 22 - 32 mmol/L 26  Calcium 8.9 - 10.3 mg/dL 9.1  Total Protein 6.5 - 8.1 g/dL 6.2(L)  Total Bilirubin 0.3 - 1.2 mg/dL 0.7  Alkaline Phos 38 - 126 U/L 71  AST 15 - 41 U/L 18  ALT 14 - 54 U/L 17   CBC Latest Ref Rng & Units 07/08/2017  WBC 3.6 - 11.0 K/uL 0.9(LL)  Hemoglobin 12.0 - 16.0 g/dL 8.2(L)  Hematocrit 35.0 - 47.0 % 23.0(L)  Platelets 150 - 400 K/uL 14(LL)    No images are attached to the encounter.  No results found.   Assessment and plan- Patient is a 72 y.o.  female who presents to symptom management for fever right ear pain and sore throat.  1.  Right breast cancer/MDS: S/p Dacogen days 1 through 5 every 28 days. S/p cycle 1 given on 06/14/2017.  Tolerated well except for severe pancytopenia.  Requiring frequent lab draws close monitoring along with blood and platelet transfusions weekly.  Started on prophylactic antibiotics, antivirals and antifungals approximately 10 days ago.  Scheduled to return to clinic on Monday for consideration of cycle 2 of Dacogen.  Received platelets today.  2.  Sore throat/Fever: Currently on prophylactic antibiotics Cipro 500 mg for ANC of 0.  Small amount of oral exudate present on exam.  She is on Diflucan prophylactically.  We will add Magic mouthwash to help with residual thrush.  Possible she has esophagitis but she also could be devloping sinus infection.  Both ears clear on exam.  No adenopathy present but tenderness of right tonsillar and submandibular lymph nodes.  Consulted Dr. Rogue Bussing, and he recommends switching antibiotic from Cipro 500 mg daily to Augmentin 875-125 mg BID for seven days to cover for possible developing sinus infection.  Patient in agreement with plan and will be reevaluated on Monday.  Encourage patient to take Tylenol for fevers.    Visit Diagnosis 1. Sore throat   2. Chemotherapy-induced neutropenia (HCC)   3. History of breast cancer   4. Ear pain, right     Patient expressed understanding and was in agreement with this plan. She also understands that She can call clinic at any  time with any questions, concerns, or complaints.   Greater than 50% was spent in counseling and coordination of care with this patient including but not limited to discussion of the relevant topics above (See A&P) including, but not limited to diagnosis and management of acute and chronic medical conditions.    Faythe Casa, AGNP-C [facility]- [phone] Pager-  [phone] 07/08/2017 3:51 PM

## 2017-07-06 LAB — BPAM PLATELET PHERESIS
Blood Product Expiration Date: 201904261435
ISSUE DATE / TIME: 201904261433
UNIT TYPE AND RH: 5100

## 2017-07-06 LAB — PREPARE PLATELET PHERESIS: UNIT DIVISION: 0

## 2017-07-08 ENCOUNTER — Inpatient Hospital Stay: Payer: Medicare Other

## 2017-07-08 ENCOUNTER — Other Ambulatory Visit: Payer: Self-pay | Admitting: Internal Medicine

## 2017-07-08 ENCOUNTER — Other Ambulatory Visit: Payer: Self-pay

## 2017-07-08 ENCOUNTER — Encounter: Payer: Self-pay | Admitting: Internal Medicine

## 2017-07-08 ENCOUNTER — Inpatient Hospital Stay (HOSPITAL_BASED_OUTPATIENT_CLINIC_OR_DEPARTMENT_OTHER): Payer: Medicare Other | Admitting: Internal Medicine

## 2017-07-08 VITALS — BP 122/67 | HR 96 | Temp 97.7°F | Resp 18 | Wt 135.2 lb

## 2017-07-08 DIAGNOSIS — D469 Myelodysplastic syndrome, unspecified: Secondary | ICD-10-CM

## 2017-07-08 DIAGNOSIS — D696 Thrombocytopenia, unspecified: Secondary | ICD-10-CM

## 2017-07-08 DIAGNOSIS — C7801 Secondary malignant neoplasm of right lung: Secondary | ICD-10-CM

## 2017-07-08 DIAGNOSIS — Z5111 Encounter for antineoplastic chemotherapy: Secondary | ICD-10-CM | POA: Diagnosis not present

## 2017-07-08 DIAGNOSIS — D649 Anemia, unspecified: Secondary | ICD-10-CM

## 2017-07-08 DIAGNOSIS — R509 Fever, unspecified: Secondary | ICD-10-CM | POA: Diagnosis not present

## 2017-07-08 DIAGNOSIS — H9201 Otalgia, right ear: Secondary | ICD-10-CM

## 2017-07-08 DIAGNOSIS — Z8614 Personal history of Methicillin resistant Staphylococcus aureus infection: Secondary | ICD-10-CM

## 2017-07-08 DIAGNOSIS — Z803 Family history of malignant neoplasm of breast: Secondary | ICD-10-CM

## 2017-07-08 DIAGNOSIS — F17210 Nicotine dependence, cigarettes, uncomplicated: Secondary | ICD-10-CM

## 2017-07-08 DIAGNOSIS — M810 Age-related osteoporosis without current pathological fracture: Secondary | ICD-10-CM

## 2017-07-08 DIAGNOSIS — H00014 Hordeolum externum left upper eyelid: Secondary | ICD-10-CM | POA: Diagnosis not present

## 2017-07-08 DIAGNOSIS — D693 Immune thrombocytopenic purpura: Secondary | ICD-10-CM

## 2017-07-08 DIAGNOSIS — M1990 Unspecified osteoarthritis, unspecified site: Secondary | ICD-10-CM

## 2017-07-08 DIAGNOSIS — J029 Acute pharyngitis, unspecified: Secondary | ICD-10-CM | POA: Diagnosis not present

## 2017-07-08 DIAGNOSIS — Z170 Estrogen receptor positive status [ER+]: Secondary | ICD-10-CM | POA: Diagnosis not present

## 2017-07-08 DIAGNOSIS — Z923 Personal history of irradiation: Secondary | ICD-10-CM

## 2017-07-08 DIAGNOSIS — J449 Chronic obstructive pulmonary disease, unspecified: Secondary | ICD-10-CM

## 2017-07-08 DIAGNOSIS — I10 Essential (primary) hypertension: Secondary | ICD-10-CM

## 2017-07-08 DIAGNOSIS — Z79811 Long term (current) use of aromatase inhibitors: Secondary | ICD-10-CM

## 2017-07-08 DIAGNOSIS — K219 Gastro-esophageal reflux disease without esophagitis: Secondary | ICD-10-CM

## 2017-07-08 DIAGNOSIS — C50911 Malignant neoplasm of unspecified site of right female breast: Secondary | ICD-10-CM

## 2017-07-08 DIAGNOSIS — Z9221 Personal history of antineoplastic chemotherapy: Secondary | ICD-10-CM

## 2017-07-08 LAB — CBC WITH DIFFERENTIAL/PLATELET
BASOS ABS: 0 10*3/uL (ref 0–0.1)
Basophils Relative: 0 %
EOS PCT: 0 %
Eosinophils Absolute: 0 10*3/uL (ref 0–0.7)
HEMATOCRIT: 23 % — AB (ref 35.0–47.0)
HEMOGLOBIN: 8.2 g/dL — AB (ref 12.0–16.0)
LYMPHS ABS: 0.9 10*3/uL — AB (ref 1.0–3.6)
LYMPHS PCT: 98 %
MCH: 29.6 pg (ref 26.0–34.0)
MCHC: 35.7 g/dL (ref 32.0–36.0)
MCV: 82.9 fL (ref 80.0–100.0)
Monocytes Absolute: 0 10*3/uL — ABNORMAL LOW (ref 0.2–0.9)
Monocytes Relative: 1 %
NEUTROS ABS: 0 10*3/uL — AB (ref 1.4–6.5)
NEUTROS PCT: 1 %
Platelets: 14 10*3/uL — CL (ref 150–400)
RBC: 2.78 MIL/uL — AB (ref 3.80–5.20)
RDW: 13.6 % (ref 11.5–14.5)
WBC: 0.9 10*3/uL — AB (ref 3.6–11.0)

## 2017-07-08 LAB — SAMPLE TO BLOOD BANK

## 2017-07-08 MED ORDER — HEPARIN SOD (PORK) LOCK FLUSH 100 UNIT/ML IV SOLN: 500.0000 [IU] | Freq: Once | INTRAVENOUS | Status: AC

## 2017-07-08 MED ORDER — HEPARIN SOD (PORK) LOCK FLUSH 100 UNIT/ML IV SOLN
INTRAVENOUS | Status: AC
  Filled 2017-07-08: qty 5

## 2017-07-08 NOTE — Progress Notes (Signed)
[facility] [place] OFFICE PROGRESS NOTE  Patient Care Team: [name], MD as PCP - General (Family Medicine) [name], DPM as Consulting Physician (Podiatry) [name], MD as Consulting Physician (Internal Medicine) Robert Bellow, MD (General Surgery)   SUMMARY OF ONCOLOGIC HISTORY:  Oncology History   # April 2018- Left chest wall Bx- IMC; ER-PR-POS; her 2 Neu NEG; PET- mild hilar/distal adenopathy; ~1 cm right lung nodule/ several sub-centimeter.   # May 2018- cont [facility]; Abemacliclib [on HOLD sec to cytopenia]; MARCH 2019- PET near CR from breast cancer  # April 2017-isolated Thrombocytopenia- platelets- 113; worsening PANCYTOPENIA- April 2018- BMBx- no evidence of malignancy; MDS/Acute leuk; Karyotype/MDS- FISH panel-NEG [d/w Dr.Smir];   # June 2018- REPEAT BMBx/[facility] [June 2018-Dr.Foster; Aug 2018- Dr.Powell at [facility]]; No Diagnosis.   # April 11, 2017 [third bone marrow]-is still inconclusive; no obvious evidence of acute leukemia or obvious MDS except for mild dysplastic changes; and significantly reduced megakaryocytes. 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 [reviwed at [facility]; [facility]-NEG]  # April 1st 2019- Dacogen   # May 29th 2018- N-plate- suboptimal improvement; NOV 1st week start promacta 50 mg/day; suboptimal response; DEC 1st- start promacta 75 mg/day  # DEC 18th-INCREASE PROMACTA to 188m/day  # 2006- RIGHT BREAST CA [pT1cN0M0; STAGE I; ER/PRPos; Her 2 Neu-NEG] s/p FEC x 6 [NSABP B-36]; Femara [Dec 2007-2012]  # Osteoporosis s/p Reclast; BMD- 2015-wnl- ca+vit D   Dr.Stewart [derm ? Squamous cell s/p freeze-oct 2018]        Carcinoma of overlapping sites of right breast in female, estrogen receptor positive ([place]    MDS (myelodysplastic syndrome) ([place]     INTERVAL HISTORY:  A very pleasant 72year old female patient  metastatic /recurrent breast cancer- ER/PR positive HER-2/neu negative currently on Femara; and pancytopenia/ with more severe thrombocytopenia-likely secondary MDS is here for follow-up.  Patient started Dacogen approximately 4 weeks ago.  Patient currently has severe neutropenia-secondary to MDS/Dacogen.  Patient noted to have a sore throat/pain with swallowing over the last 4 days.  Patient was evaluated at the symptom management clinic-started on Augmentin twice a day.  No fever no chills.  No nausea no vomiting.  Complains of fatigue.  No bleeding gums or bleeding nose.  Patient has been needing platelet transfusion about once a week; and PRBC transfusion every 1-2 weeks.  Otherwise denies any blood in stools or black colored stools.  She continues to take Femara; no body aches or joint pains.No new lumps or bumps.   REVIEW OF SYSTEMS:  A complete 10 point review of system is done which is negative except mentioned above/history of present illness.   PAST MEDICAL HISTORY :  Past Medical History:  Diagnosis Date  . Blood dyscrasia   . Breast cancer ([facility]    right, lumpectomy, radiation, chemo  . Breast cancer ([facility]    Right, 2007  . Breast cancer ([place] 06/20/2016   INVASIVE DUCTAL CARCINOMA.  . Collapsed lung 02/14/2017   RIGHT  . Complication of anesthesia    PT STATED AFTER BIL HIP SURGERIES SHE STOPPED BREATHING THE NIGHT AFTER SURGERY  . COPD (chronic obstructive pulmonary disease) ([facility]   . GERD (gastroesophageal reflux disease)   . Headache   . History of chemotherapy   . History of methicillin resistant staphylococcus aureus (MRSA) 2011  . History of radiation therapy   . Hyperlipidemia   . Hypertension   . Liver  cancer ([place]) 05/2016  . Lung cancer ([place]) 05/2016  . Osteoarthritis    right hip  . Osteoporosis   . Squamous cell carcinoma    leg, Followed by Dr. Nicole Kindred    PAST SURGICAL HISTORY :   Past Surgical History:  Procedure Laterality Date  . ABDOMINAL  HYSTERECTOMY    . BREAST BIOPSY Left 2002   left breast, calcifications  . BREAST BIOPSY Left 06/20/2016   INVASIVE DUCTAL CARCINOMA.  Marland Kitchen BREAST EXCISIONAL BIOPSY Right 2007   positive  . bunion repair    . ESOPHAGOGASTRODUODENOSCOPY (EGD) WITH PROPOFOL N/A 08/05/2016   Procedure: ESOPHAGOGASTRODUODENOSCOPY (EGD) WITH PROPOFOL;  Surgeon: San Jetty, MD;  Location: [facility] ENDOSCOPY;  Service: General;  Laterality: N/A;  . JOINT REPLACEMENT    . left breast biopsy    . NASAL SINUS SURGERY    . PORTACATH PLACEMENT Right 04/13/2017   Procedure: INSERTION PORT-A-CATH- RIGHT INTERNAL JUGULAR;  Surgeon: Robert Bellow, MD;  Location: [facility] ORS;  Service: General;  Laterality: Right;  . right hip replacement    . SHOULDER SURGERY    . SQUAMOUS CELL CARCINOMA EXCISION     right leg, Dr. Nicole Kindred  . TOTAL HIP ARTHROPLASTY     right    FAMILY HISTORY :   Family History  Problem Relation Age of Onset  . Heart disease Father 82  . Heart disease Mother   . Hyperlipidemia Mother   . Heart disease Brother   . Diabetes Brother   . Diabetes Maternal Grandmother   . Breast cancer Cousin   . Kidney disease Brother     SOCIAL HISTORY:   Social History   Tobacco Use  . Smoking status: Current Some Day Smoker    Packs/day: 0.10    Years: 30.00    Pack years: 3.00    Types: Cigarettes  . Smokeless tobacco: Never Used  Substance Use Topics  . Alcohol use: Yes    Alcohol/week: 0.0 oz    Comment: socially  . Drug use: No    ALLERGIES:  is allergic to codeine; fish-derived products; morphine sulfate; adhesive [tape]; and oxycodone.  MEDICATIONS:  Current Outpatient Medications  Medication Sig Dispense Refill  . acyclovir (ZOVIRAX) 400 MG tablet Take 1 tablet (400 mg total) by mouth 2 (two) times daily. 60 tablet 3  . amoxicillin-clavulanate (AUGMENTIN) 875-125 MG tablet Take 1 tablet by mouth 2 (two) times daily. 14 tablet 0  . cyanocobalamin (,VITAMIN B-12,) 1000 MCG/ML injection  Inject 1,000 mcg into the muscle every 30 (thirty) days.    . ergocalciferol (VITAMIN D2) 50000 units capsule Take 1 capsule (50,000 Units total) by mouth every Saturday. In the morning. 12 capsule 0  . fexofenadine (ALLEGRA) 180 MG tablet Take 180 mg by mouth every morning.     . fluconazole (DIFLUCAN) 200 MG tablet Take 1 tablet (200 mg total) by mouth daily. 30 tablet 3  . Glucosamine-Chondroitin (COSAMIN DS PO) Take 1 tablet by mouth daily. In the morning.    Marland Kitchen letrozole (FEMARA) 2.5 MG tablet Take 1 tablet (2.5 mg total) by mouth daily. Once a day. 90 tablet 0  . lidocaine-prilocaine (EMLA) cream Apply 1 application topically as needed. Apply generously over the Mediport 45 minutes prior to chemotherapy. 30 g 0  . magic mouthwash w/lidocaine SOLN Take 5 mLs by mouth 4 (four) times daily. 480 mL 3  . metoprolol succinate (TOPROL-XL) 25 MG 24 hr tablet Take 1 tablet (25 mg total) by mouth daily. 90 tablet 3  .  pravastatin (PRAVACHOL) 40 MG tablet Take 1 tablet (40 mg total) by mouth daily. 90 tablet 3  . Probiotic Product (PROBIOTIC & ACIDOPHILUS EX ST PO) Take 1 tablet by mouth daily.     . ranitidine (ZANTAC) 75 MG tablet Take 75 mg by mouth as needed.     . vitamin B-12 (CYANOCOBALAMIN) 1000 MCG tablet Take 1,000 mcg by mouth daily.     Marland Kitchen ALPRAZolam (XANAX) 0.5 MG tablet TAKE 1 TABLET BY MOUTH EVERY 8 HOURS AS NEEDED FOR ANXIETY OR SLEEP (Patient not taking: Reported on 07/08/2017) 30 tablet 2  . neomycin-polymyxin-pramoxine (NEOSPORIN PLUS) 1 % cream Apply 1 application topically 2 (two) times daily as needed (Nasal passages as needed for dryness.).    Marland Kitchen ondansetron (ZOFRAN) 8 MG tablet Take 1 tablet by mouth every 8 (eight) hours as needed for nausea/vomiting.     No current facility-administered medications for this visit.    Facility-Administered Medications Ordered in Other Visits  Medication Dose Route Frequency Provider Last Rate Last Dose  . 0.9 %  sodium chloride infusion    Intravenous Continuous [name], MD   Stopped at 06/28/17 1450  . heparin lock flush 100 unit/mL  500 Units Intravenous Once Charlaine Dalton R, MD      . sodium chloride flush (NS) 0.9 % injection 10 mL  10 mL Intravenous PRN [name], MD        PHYSICAL EXAMINATION: ECOG PERFORMANCE STATUS: 0 - Asymptomatic  BP 122/67 (BP Location: Left Arm, Patient Position: Sitting)   Pulse 96   Temp 97.7 F (36.5 C) (Tympanic)   Resp 18   Wt 135 lb 3.2 oz (61.3 kg)   BMI 25.55 kg/m   Filed Weights   07/08/17 1040  Weight: 135 lb 3.2 oz (61.3 kg)    GENERAL: Well-nourished well-developed; Alert, no distress and comfortable.  She is accompanied by husband EYES: WNL.  OROPHARYNX: no thrush or ulceration; good dentition. No bleeding noted. NECK: supple, no masses felt LYMPH:  no palpable lymphadenopathy in the cervical, axillary or inguinal regions LUNGS: Decreased breath sounds to auscultation bilaterally and  No wheeze or crackles HEART/CVS: regular rate & rhythm and no murmurs; No lower extremity edema ABDOMEN:abdomen soft, non-tender and normal bowel sounds Musculoskeletal:no cyanosis of digits and no clubbing  PSYCH: alert & oriented x 3 with fluent speech NEURO: no focal motor/sensory deficits SKIN: Mild chronic ecchymosis.  Not any worse.   LABORATORY DATA:  I have reviewed the data as listed    Component Value Date/Time   NA 135 06/10/2017 1028   K 4.3 06/10/2017 1028   CL 102 06/10/2017 1028   CO2 26 06/10/2017 1028   GLUCOSE 116 (H) 06/10/2017 1028   BUN 14 06/10/2017 1028   CREATININE 0.70 06/10/2017 1028   CREATININE 0.67 07/09/2014 1321   CREATININE 0.70 04/30/2014 1455   CALCIUM 9.1 06/10/2017 1028   PROT 6.2 (L) 06/10/2017 1028   PROT 7.3 07/09/2014 1321   ALBUMIN 3.5 06/10/2017 1028   ALBUMIN 4.3 07/09/2014 1321   AST 18 06/10/2017 1028   AST 19 07/09/2014 1321   ALT 17 06/10/2017 1028   ALT 18 07/09/2014 1321   ALKPHOS 71  06/10/2017 1028   ALKPHOS 66 07/09/2014 1321   BILITOT 0.7 06/10/2017 1028   BILITOT 0.6 07/09/2014 1321   GFRNONAA >60 06/10/2017 1028   GFRNONAA >60 07/09/2014 1321   GFRAA >60 06/10/2017 1028   GFRAA >60 07/09/2014 1321    No  results found for: SPEP, UPEP  Lab Results  Component Value Date   WBC 0.9 (LL) 07/08/2017   NEUTROABS 0.0 (L) 07/08/2017   HGB 8.2 (L) 07/08/2017   HCT 23.0 (L) 07/08/2017   MCV 82.9 07/08/2017   PLT 14 (LL) 07/08/2017      Chemistry      Component Value Date/Time   NA 135 06/10/2017 1028   K 4.3 06/10/2017 1028   CL 102 06/10/2017 1028   CO2 26 06/10/2017 1028   BUN 14 06/10/2017 1028   CREATININE 0.70 06/10/2017 1028   CREATININE 0.67 07/09/2014 1321   CREATININE 0.70 04/30/2014 1455      Component Value Date/Time   CALCIUM 9.1 06/10/2017 1028   ALKPHOS 71 06/10/2017 1028   ALKPHOS 66 07/09/2014 1321   AST 18 06/10/2017 1028   AST 19 07/09/2014 1321   ALT 17 06/10/2017 1028   ALT 18 07/09/2014 1321   BILITOT 0.7 06/10/2017 1028   BILITOT 0.6 07/09/2014 1321        ASSESSMENT & PLAN:   MDS (myelodysplastic syndrome) (HCC) # Severe thrombocytopenia/anemia- highly suspicious for MDS;/Katotype- slightly abnormal- trisomy 18 in 2 out of 17 metaphase cells.  Patient currently on Dacogen 20 mg/m square day 1 through 5 every 4 weeks; patient tolerated treatment fairly well  Throat pain/severe neutropenia [see discussion below]  # cycle # 1day- 28; HOLD DACOGEN today-Today platelets are 14; hemoglobin 8.2; white count 1.9 ANC 0.  PLAN platelet transfusion tomorrow.  Discussed that if white count does not improve-Granix/Neupogen patient would be recommended.  # sore throrat s/p augumentin x7 day [5/3]; continue mouth wash [expensive].  Resume Levaquin after finishing Augmentin.  # Anti-biotic prophylaxis-given severe neutropenia-recommend levofloxacin 500 milligrams a day/ acyclovir/dilfucan.  #  Stage IV recurrent/metastatic. ER/PR  positive HER-2/neu negative breast cancer. On Femara. PET- significant response noted.   #Continue labs Mondays and Thursdays; with possible transfusion the day after.  # follow up in 2 weeks/MD/dacogen 1-5. Possible PRBC trasnfusion on 5/3; platlets on 4/30/.      [name], MD 07/08/2017 1:25 PM

## 2017-07-08 NOTE — Assessment & Plan Note (Addendum)
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  Patient currently on Dacogen 20 mg/m square day 1 through 5 every 4 weeks; patient tolerated treatment fairly well  Throat pain/severe neutropenia [see discussion below]  # cycle # 1day- 28; HOLD DACOGEN today-Today platelets are 14; hemoglobin 8.2; white count 1.9 ANC 0.  PLAN platelet transfusion tomorrow.  Discussed that if white count does not improve-Granix/Neupogen patient would be recommended.  # sore throrat s/p augumentin x7 day [5/3]; continue mouth wash [expensive].  Resume Levaquin after finishing Augmentin.  # Anti-biotic prophylaxis-given severe neutropenia-recommend levofloxacin 500 milligrams a day/ acyclovir/dilfucan.  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 On Femara. PET- significant response noted.   #Continue labs Mondays and Thursdays; with possible transfusion the day after.  # follow up in 2 weeks/MD/dacogen 1-5. Possible PRBC trasnfusion on 5/3; platlets on 4/30/.

## 2017-07-08 NOTE — Progress Notes (Signed)
Here for follow up. Still having mouth pain-better per pt right sided and appetite poor and not eating well per pt and husband.  Still on ABX  For throat per pt

## 2017-07-09 ENCOUNTER — Inpatient Hospital Stay: Payer: Medicare Other

## 2017-07-09 DIAGNOSIS — C50911 Malignant neoplasm of unspecified site of right female breast: Secondary | ICD-10-CM | POA: Diagnosis not present

## 2017-07-09 DIAGNOSIS — Z5111 Encounter for antineoplastic chemotherapy: Secondary | ICD-10-CM | POA: Diagnosis not present

## 2017-07-09 DIAGNOSIS — D693 Immune thrombocytopenic purpura: Secondary | ICD-10-CM

## 2017-07-09 DIAGNOSIS — D469 Myelodysplastic syndrome, unspecified: Secondary | ICD-10-CM | POA: Diagnosis not present

## 2017-07-09 DIAGNOSIS — Z170 Estrogen receptor positive status [ER+]: Secondary | ICD-10-CM | POA: Diagnosis not present

## 2017-07-09 DIAGNOSIS — Z79811 Long term (current) use of aromatase inhibitors: Secondary | ICD-10-CM | POA: Diagnosis not present

## 2017-07-09 DIAGNOSIS — C7801 Secondary malignant neoplasm of right lung: Secondary | ICD-10-CM | POA: Diagnosis not present

## 2017-07-09 MED ORDER — SODIUM CHLORIDE 0.9% FLUSH
3.0000 mL | INTRAVENOUS | Status: DC | PRN
  Filled 2017-07-09: qty 3

## 2017-07-09 MED ORDER — SODIUM CHLORIDE 0.9% FLUSH
10.0000 mL | INTRAVENOUS | Status: DC | PRN
  Filled 2017-07-09: qty 10

## 2017-07-09 MED ORDER — DIPHENHYDRAMINE HCL 25 MG PO CAPS
25.0000 mg | ORAL_CAPSULE | Freq: Once | ORAL | Status: AC
  Administered 2017-07-09: 25 mg via ORAL
  Filled 2017-07-09: qty 1

## 2017-07-09 MED ORDER — METHYLPREDNISOLONE SODIUM SUCC 125 MG IJ SOLR
40.0000 mg | Freq: Once | INTRAMUSCULAR | Status: AC
  Administered 2017-07-09: 40 mg via INTRAVENOUS
  Filled 2017-07-09: qty 2

## 2017-07-09 MED ORDER — ACETAMINOPHEN 325 MG PO TABS
650.0000 mg | ORAL_TABLET | Freq: Once | ORAL | Status: AC
  Administered 2017-07-09: 650 mg via ORAL
  Filled 2017-07-09: qty 2

## 2017-07-09 MED ORDER — HEPARIN SOD (PORK) LOCK FLUSH 100 UNIT/ML IV SOLN
500.0000 [IU] | Freq: Every day | INTRAVENOUS | Status: AC | PRN
  Administered 2017-07-09: 500 [IU]
  Filled 2017-07-09: qty 5

## 2017-07-10 ENCOUNTER — Inpatient Hospital Stay: Payer: Medicare Other

## 2017-07-10 LAB — PREPARE PLATELET PHERESIS: Unit division: 0

## 2017-07-10 LAB — BPAM PLATELET PHERESIS
BLOOD PRODUCT EXPIRATION DATE: 201904301430
ISSUE DATE / TIME: 201904301353
Unit Type and Rh: 9500

## 2017-07-11 ENCOUNTER — Other Ambulatory Visit: Payer: Self-pay | Admitting: Internal Medicine

## 2017-07-11 ENCOUNTER — Ambulatory Visit: Payer: Medicare Other

## 2017-07-11 ENCOUNTER — Inpatient Hospital Stay: Payer: Medicare Other | Attending: Internal Medicine

## 2017-07-11 ENCOUNTER — Telehealth: Payer: Self-pay | Admitting: *Deleted

## 2017-07-11 DIAGNOSIS — R630 Anorexia: Secondary | ICD-10-CM | POA: Diagnosis not present

## 2017-07-11 DIAGNOSIS — D649 Anemia, unspecified: Secondary | ICD-10-CM | POA: Insufficient documentation

## 2017-07-11 DIAGNOSIS — R5383 Other fatigue: Secondary | ICD-10-CM | POA: Insufficient documentation

## 2017-07-11 DIAGNOSIS — C50811 Malignant neoplasm of overlapping sites of right female breast: Secondary | ICD-10-CM | POA: Insufficient documentation

## 2017-07-11 DIAGNOSIS — Z8614 Personal history of Methicillin resistant Staphylococcus aureus infection: Secondary | ICD-10-CM | POA: Insufficient documentation

## 2017-07-11 DIAGNOSIS — Z923 Personal history of irradiation: Secondary | ICD-10-CM | POA: Insufficient documentation

## 2017-07-11 DIAGNOSIS — D62 Acute posthemorrhagic anemia: Secondary | ICD-10-CM

## 2017-07-11 DIAGNOSIS — Z79899 Other long term (current) drug therapy: Secondary | ICD-10-CM | POA: Insufficient documentation

## 2017-07-11 DIAGNOSIS — E46 Unspecified protein-calorie malnutrition: Secondary | ICD-10-CM | POA: Diagnosis not present

## 2017-07-11 DIAGNOSIS — Z803 Family history of malignant neoplasm of breast: Secondary | ICD-10-CM | POA: Insufficient documentation

## 2017-07-11 DIAGNOSIS — K219 Gastro-esophageal reflux disease without esophagitis: Secondary | ICD-10-CM | POA: Diagnosis not present

## 2017-07-11 DIAGNOSIS — R0602 Shortness of breath: Secondary | ICD-10-CM | POA: Insufficient documentation

## 2017-07-11 DIAGNOSIS — C787 Secondary malignant neoplasm of liver and intrahepatic bile duct: Secondary | ICD-10-CM | POA: Insufficient documentation

## 2017-07-11 DIAGNOSIS — D469 Myelodysplastic syndrome, unspecified: Secondary | ICD-10-CM | POA: Diagnosis not present

## 2017-07-11 DIAGNOSIS — D701 Agranulocytosis secondary to cancer chemotherapy: Secondary | ICD-10-CM | POA: Diagnosis not present

## 2017-07-11 DIAGNOSIS — Z170 Estrogen receptor positive status [ER+]: Secondary | ICD-10-CM | POA: Insufficient documentation

## 2017-07-11 DIAGNOSIS — E785 Hyperlipidemia, unspecified: Secondary | ICD-10-CM | POA: Insufficient documentation

## 2017-07-11 DIAGNOSIS — C7800 Secondary malignant neoplasm of unspecified lung: Secondary | ICD-10-CM | POA: Insufficient documentation

## 2017-07-11 DIAGNOSIS — Z9221 Personal history of antineoplastic chemotherapy: Secondary | ICD-10-CM | POA: Insufficient documentation

## 2017-07-11 DIAGNOSIS — J449 Chronic obstructive pulmonary disease, unspecified: Secondary | ICD-10-CM | POA: Diagnosis not present

## 2017-07-11 DIAGNOSIS — F17210 Nicotine dependence, cigarettes, uncomplicated: Secondary | ICD-10-CM | POA: Diagnosis not present

## 2017-07-11 DIAGNOSIS — M810 Age-related osteoporosis without current pathological fracture: Secondary | ICD-10-CM | POA: Insufficient documentation

## 2017-07-11 DIAGNOSIS — M1990 Unspecified osteoarthritis, unspecified site: Secondary | ICD-10-CM | POA: Diagnosis not present

## 2017-07-11 DIAGNOSIS — K1230 Oral mucositis (ulcerative), unspecified: Secondary | ICD-10-CM | POA: Insufficient documentation

## 2017-07-11 DIAGNOSIS — K5900 Constipation, unspecified: Secondary | ICD-10-CM | POA: Diagnosis not present

## 2017-07-11 DIAGNOSIS — R51 Headache: Secondary | ICD-10-CM | POA: Insufficient documentation

## 2017-07-11 DIAGNOSIS — D696 Thrombocytopenia, unspecified: Secondary | ICD-10-CM | POA: Insufficient documentation

## 2017-07-11 DIAGNOSIS — I10 Essential (primary) hypertension: Secondary | ICD-10-CM | POA: Insufficient documentation

## 2017-07-11 DIAGNOSIS — Z85828 Personal history of other malignant neoplasm of skin: Secondary | ICD-10-CM | POA: Insufficient documentation

## 2017-07-11 LAB — CBC WITH DIFFERENTIAL/PLATELET
BASOS ABS: 0 10*3/uL (ref 0–0.1)
Basophils Relative: 0 %
EOS ABS: 0 10*3/uL (ref 0–0.7)
EOS PCT: 0 %
HEMATOCRIT: 21.4 % — AB (ref 35.0–47.0)
Hemoglobin: 7.7 g/dL — ABNORMAL LOW (ref 12.0–16.0)
Lymphocytes Relative: 98 %
Lymphs Abs: 1.2 10*3/uL (ref 1.0–3.6)
MCH: 30 pg (ref 26.0–34.0)
MCHC: 36.1 g/dL — AB (ref 32.0–36.0)
MCV: 83.1 fL (ref 80.0–100.0)
MONOS PCT: 1 %
Monocytes Absolute: 0 10*3/uL — ABNORMAL LOW (ref 0.2–0.9)
NEUTROS ABS: 0 10*3/uL — AB (ref 1.4–6.5)
Neutrophils Relative %: 1 %
PLATELETS: 41 10*3/uL — AB (ref 150–440)
RBC: 2.57 MIL/uL — ABNORMAL LOW (ref 3.80–5.20)
RDW: 14 % (ref 11.5–14.5)
WBC: 1.2 10*3/uL — CL (ref 3.6–11.0)

## 2017-07-11 LAB — SAMPLE TO BLOOD BANK

## 2017-07-11 LAB — PREPARE RBC (CROSSMATCH)

## 2017-07-11 NOTE — Telephone Encounter (Signed)
Critical Lab called ANC 0.0

## 2017-07-12 ENCOUNTER — Inpatient Hospital Stay: Payer: Medicare Other

## 2017-07-12 DIAGNOSIS — C50811 Malignant neoplasm of overlapping sites of right female breast: Secondary | ICD-10-CM | POA: Diagnosis not present

## 2017-07-12 DIAGNOSIS — C787 Secondary malignant neoplasm of liver and intrahepatic bile duct: Secondary | ICD-10-CM | POA: Diagnosis not present

## 2017-07-12 DIAGNOSIS — D469 Myelodysplastic syndrome, unspecified: Secondary | ICD-10-CM | POA: Diagnosis not present

## 2017-07-12 DIAGNOSIS — Z170 Estrogen receptor positive status [ER+]: Secondary | ICD-10-CM | POA: Diagnosis not present

## 2017-07-12 DIAGNOSIS — C7800 Secondary malignant neoplasm of unspecified lung: Secondary | ICD-10-CM | POA: Diagnosis not present

## 2017-07-12 DIAGNOSIS — D696 Thrombocytopenia, unspecified: Secondary | ICD-10-CM

## 2017-07-12 DIAGNOSIS — D649 Anemia, unspecified: Secondary | ICD-10-CM | POA: Diagnosis not present

## 2017-07-12 DIAGNOSIS — D62 Acute posthemorrhagic anemia: Secondary | ICD-10-CM

## 2017-07-12 MED ORDER — DIPHENHYDRAMINE HCL 25 MG PO CAPS
25.0000 mg | ORAL_CAPSULE | Freq: Once | ORAL | Status: AC
  Administered 2017-07-12: 25 mg via ORAL
  Filled 2017-07-12: qty 1

## 2017-07-12 MED ORDER — ACETAMINOPHEN 325 MG PO TABS
650.0000 mg | ORAL_TABLET | Freq: Once | ORAL | Status: AC
  Administered 2017-07-12: 650 mg via ORAL
  Filled 2017-07-12: qty 2

## 2017-07-12 MED ORDER — SODIUM CHLORIDE 0.9 % IV SOLN
250.0000 mL | Freq: Once | INTRAVENOUS | Status: AC
  Administered 2017-07-12: 250 mL via INTRAVENOUS
  Filled 2017-07-12: qty 250

## 2017-07-12 MED ORDER — HEPARIN SOD (PORK) LOCK FLUSH 100 UNIT/ML IV SOLN
500.0000 [IU] | Freq: Every day | INTRAVENOUS | Status: AC | PRN
  Administered 2017-07-12: 500 [IU]
  Filled 2017-07-12: qty 5

## 2017-07-12 MED ORDER — DEXAMETHASONE SODIUM PHOSPHATE 10 MG/ML IJ SOLN
INTRAMUSCULAR | Status: AC
Start: 2017-07-12 — End: 2017-07-12
  Filled 2017-07-12: qty 1

## 2017-07-12 MED ORDER — SODIUM CHLORIDE 0.9 % IV SOLN: 8.0000 mg | Freq: Once | INTRAVENOUS | Status: DC

## 2017-07-12 MED ORDER — DEXAMETHASONE SODIUM PHOSPHATE 10 MG/ML IJ SOLN
8.0000 mg | Freq: Once | INTRAMUSCULAR | Status: AC
  Administered 2017-07-12: 8 mg via INTRAVENOUS

## 2017-07-12 NOTE — Progress Notes (Signed)
Pt reports that she has not been eating and drinking because she is constantly nauseous. pt states the Zofran is making her constipated so she does not like to take it. Heather RN aware, and will call in Compazine prescription per Dr. Rogue Bussing, also pt to receive 8 mg IV decadron in clinic. Pt aware and verbalizes understanding. Per Nira Conn RN per Dr. Rogue Bussing okay to proceed with blood transfusion with VS.

## 2017-07-13 LAB — TYPE AND SCREEN
ABO/RH(D): O POS
Antibody Screen: POSITIVE
DONOR AG TYPE: NEGATIVE
UNIT DIVISION: 0

## 2017-07-13 LAB — BPAM RBC
Blood Product Expiration Date: 201905222359
ISSUE DATE / TIME: 201905031017
UNIT TYPE AND RH: 5100

## 2017-07-15 ENCOUNTER — Inpatient Hospital Stay (HOSPITAL_BASED_OUTPATIENT_CLINIC_OR_DEPARTMENT_OTHER): Payer: Medicare Other | Admitting: Nurse Practitioner

## 2017-07-15 ENCOUNTER — Other Ambulatory Visit: Payer: Self-pay | Admitting: *Deleted

## 2017-07-15 ENCOUNTER — Other Ambulatory Visit: Payer: Self-pay

## 2017-07-15 ENCOUNTER — Inpatient Hospital Stay: Payer: Medicare Other

## 2017-07-15 VITALS — BP 123/62 | HR 123 | Temp 99.9°F

## 2017-07-15 DIAGNOSIS — Z170 Estrogen receptor positive status [ER+]: Secondary | ICD-10-CM | POA: Diagnosis not present

## 2017-07-15 DIAGNOSIS — D696 Thrombocytopenia, unspecified: Secondary | ICD-10-CM | POA: Diagnosis not present

## 2017-07-15 DIAGNOSIS — J449 Chronic obstructive pulmonary disease, unspecified: Secondary | ICD-10-CM

## 2017-07-15 DIAGNOSIS — D469 Myelodysplastic syndrome, unspecified: Secondary | ICD-10-CM

## 2017-07-15 DIAGNOSIS — D693 Immune thrombocytopenic purpura: Secondary | ICD-10-CM

## 2017-07-15 DIAGNOSIS — Z923 Personal history of irradiation: Secondary | ICD-10-CM

## 2017-07-15 DIAGNOSIS — E860 Dehydration: Secondary | ICD-10-CM

## 2017-07-15 DIAGNOSIS — C50811 Malignant neoplasm of overlapping sites of right female breast: Secondary | ICD-10-CM | POA: Diagnosis not present

## 2017-07-15 DIAGNOSIS — Z79899 Other long term (current) drug therapy: Secondary | ICD-10-CM

## 2017-07-15 DIAGNOSIS — K1230 Oral mucositis (ulcerative), unspecified: Secondary | ICD-10-CM | POA: Diagnosis not present

## 2017-07-15 DIAGNOSIS — R630 Anorexia: Secondary | ICD-10-CM

## 2017-07-15 DIAGNOSIS — Z95828 Presence of other vascular implants and grafts: Secondary | ICD-10-CM

## 2017-07-15 DIAGNOSIS — R5081 Fever presenting with conditions classified elsewhere: Principal | ICD-10-CM

## 2017-07-15 DIAGNOSIS — Z9221 Personal history of antineoplastic chemotherapy: Secondary | ICD-10-CM

## 2017-07-15 DIAGNOSIS — Z85828 Personal history of other malignant neoplasm of skin: Secondary | ICD-10-CM

## 2017-07-15 DIAGNOSIS — E46 Unspecified protein-calorie malnutrition: Secondary | ICD-10-CM | POA: Diagnosis not present

## 2017-07-15 DIAGNOSIS — M810 Age-related osteoporosis without current pathological fracture: Secondary | ICD-10-CM

## 2017-07-15 DIAGNOSIS — K1231 Oral mucositis (ulcerative) due to antineoplastic therapy: Secondary | ICD-10-CM

## 2017-07-15 DIAGNOSIS — R51 Headache: Secondary | ICD-10-CM

## 2017-07-15 DIAGNOSIS — D649 Anemia, unspecified: Secondary | ICD-10-CM

## 2017-07-15 DIAGNOSIS — D709 Neutropenia, unspecified: Secondary | ICD-10-CM

## 2017-07-15 DIAGNOSIS — Z803 Family history of malignant neoplasm of breast: Secondary | ICD-10-CM

## 2017-07-15 DIAGNOSIS — T451X5A Adverse effect of antineoplastic and immunosuppressive drugs, initial encounter: Secondary | ICD-10-CM

## 2017-07-15 DIAGNOSIS — Z8614 Personal history of Methicillin resistant Staphylococcus aureus infection: Secondary | ICD-10-CM

## 2017-07-15 DIAGNOSIS — K5900 Constipation, unspecified: Secondary | ICD-10-CM | POA: Diagnosis not present

## 2017-07-15 DIAGNOSIS — M1990 Unspecified osteoarthritis, unspecified site: Secondary | ICD-10-CM

## 2017-07-15 DIAGNOSIS — C7800 Secondary malignant neoplasm of unspecified lung: Secondary | ICD-10-CM | POA: Diagnosis not present

## 2017-07-15 DIAGNOSIS — D701 Agranulocytosis secondary to cancer chemotherapy: Secondary | ICD-10-CM

## 2017-07-15 DIAGNOSIS — I10 Essential (primary) hypertension: Secondary | ICD-10-CM

## 2017-07-15 DIAGNOSIS — K219 Gastro-esophageal reflux disease without esophagitis: Secondary | ICD-10-CM

## 2017-07-15 DIAGNOSIS — C787 Secondary malignant neoplasm of liver and intrahepatic bile duct: Secondary | ICD-10-CM | POA: Diagnosis not present

## 2017-07-15 DIAGNOSIS — R1032 Left lower quadrant pain: Secondary | ICD-10-CM

## 2017-07-15 DIAGNOSIS — E785 Hyperlipidemia, unspecified: Secondary | ICD-10-CM

## 2017-07-15 DIAGNOSIS — F17210 Nicotine dependence, cigarettes, uncomplicated: Secondary | ICD-10-CM

## 2017-07-15 LAB — CBC WITH DIFFERENTIAL/PLATELET
BASOS ABS: 0 10*3/uL (ref 0.0–0.1)
Basophils Relative: 0 %
Eosinophils Absolute: 0 10*3/uL (ref 0.0–0.7)
Eosinophils Relative: 0 %
HEMATOCRIT: 26.5 % — AB (ref 36.0–46.0)
Hemoglobin: 9.3 g/dL — ABNORMAL LOW (ref 12.0–15.0)
LYMPHS PCT: 94 %
Lymphs Abs: 0.7 10*3/uL (ref 0.7–4.0)
MCH: 29.5 pg (ref 26.0–34.0)
MCHC: 34.9 g/dL (ref 30.0–36.0)
MCV: 84.6 fL (ref 78.0–100.0)
MONO ABS: 0 10*3/uL — AB (ref 0.1–1.0)
Monocytes Relative: 2 %
NEUTROS ABS: 0 10*3/uL — AB (ref 1.7–7.7)
Neutrophils Relative %: 4 %
Platelets: 10 10*3/uL — CL (ref 150–400)
RBC: 3.14 MIL/uL — ABNORMAL LOW (ref 3.87–5.11)
RDW: 14.3 % (ref 11.5–14.5)
WBC: 0.7 10*3/uL — CL (ref 4.0–10.5)

## 2017-07-15 LAB — COMPREHENSIVE METABOLIC PANEL
ALBUMIN: 2.7 g/dL — AB (ref 3.5–5.0)
ALK PHOS: 62 U/L (ref 38–126)
ALT: 28 U/L (ref 14–54)
AST: 33 U/L (ref 15–41)
Anion gap: 12 (ref 5–15)
BILIRUBIN TOTAL: 0.7 mg/dL (ref 0.3–1.2)
BUN: 17 mg/dL (ref 6–20)
CALCIUM: 8.7 mg/dL — AB (ref 8.9–10.3)
CO2: 22 mmol/L (ref 22–32)
Chloride: 95 mmol/L — ABNORMAL LOW (ref 101–111)
Creatinine, Ser: 0.95 mg/dL (ref 0.44–1.00)
GFR calc Af Amer: >60 mL/min (ref >60)
GFR calc non Af Amer: 60 mL/min — ABNORMAL LOW (ref >60)
GLUCOSE: 122 mg/dL — AB (ref 65–99)
Potassium: 4.1 mmol/L (ref 3.5–5.1)
Sodium: 129 mmol/L — ABNORMAL LOW (ref 135–145)
TOTAL PROTEIN: 6.4 g/dL — AB (ref 6.5–8.1)

## 2017-07-15 LAB — LACTIC ACID, PLASMA: Lactic Acid, Venous: 1.5 mmol/L (ref 0.5–1.9)

## 2017-07-15 LAB — SAMPLE TO BLOOD BANK

## 2017-07-15 MED ORDER — PROCHLORPERAZINE MALEATE 10 MG PO TABS: 10.0000 mg | ORAL_TABLET | Freq: Four times a day (QID) | ORAL | 2 refills | Status: DC | PRN

## 2017-07-15 MED ORDER — TBO-FILGRASTIM 480 MCG/0.8ML ~~LOC~~ SOSY
480.0000 ug | PREFILLED_SYRINGE | Freq: Once | SUBCUTANEOUS | Status: AC
  Administered 2017-07-15: 480 ug via SUBCUTANEOUS

## 2017-07-15 MED ORDER — SODIUM CHLORIDE 0.9 % IV SOLN
Freq: Once | INTRAVENOUS | Status: AC
  Administered 2017-07-15: 12:00:00 via INTRAVENOUS
  Filled 2017-07-15: qty 1000

## 2017-07-15 MED ORDER — HEPARIN SOD (PORK) LOCK FLUSH 100 UNIT/ML IV SOLN
500.0000 [IU] | Freq: Once | INTRAVENOUS | Status: AC
  Administered 2017-07-15: 500 [IU] via INTRAVENOUS

## 2017-07-15 MED ORDER — SODIUM CHLORIDE 0.9% FLUSH
10.0000 mL | INTRAVENOUS | Status: AC | PRN
  Filled 2017-07-15: qty 10

## 2017-07-15 NOTE — Progress Notes (Signed)
[facility] [place]  [phone] [phone]  Patient Care Team: [name], MD as PCP - General (Family Medicine) [name], DPM as Consulting Physician (Podiatry) [name], MD as Consulting Physician (Internal Medicine) Bary Castilla Forest Gleason, MD (General Surgery)   Name of the patient: Shelly Arias  [id number]  07/07/1966   Date of visit: 07/15/17  Diagnosis- Possible MDS/Right Breast Cancer  Chief complaint/ Reason for visit- Abdominal Pain  Heme/Onc history: Patient last evaluated by primary oncologist, Dr. Rogue Bussing on 07/08/2017.  Oncology History   # April 2018- Left chest wall Bx- [facility]; ER-PR-POS; her 2 Neu NEG; PET- mild hilar/distal adenopathy; ~1 cm right lung nodule/ several sub-centimeter.   # May 2018- cont [facility]; Abemacliclib [on HOLD sec to cytopenia]; MARCH 2019- PET near CR from breast cancer  # April 2017-isolated Thrombocytopenia- platelets- 113; worsening PANCYTOPENIA- April 2018- BMBx- no evidence of malignancy; MDS/Acute leuk; Karyotype/MDS- FISH panel-NEG [d/w Dr.Smir];   # June 2018- REPEAT BMBx/[facility] [June 2018-Dr.Foster; Aug 2018- Dr.Powell at [facility]]; No Diagnosis.   # April 11, 2017 [third bone marrow]-is still inconclusive; no obvious evidence of acute leukemia or obvious MDS except for mild dysplastic changes; and significantly reduced megakaryocytes. 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 [reviwed at [facility]; [facility]-NEG]  # April 1st 2019- Dacogen   # May 29th 2018- N-plate- suboptimal improvement; NOV 1st week start promacta 50 mg/day; suboptimal response; DEC 1st- start promacta 75 mg/day  # DEC 18th-INCREASE PROMACTA to 150mg /day  # 2006- RIGHT BREAST CA [pT1cN0M0; STAGE I; ER/PRPos; Her 2 Neu-NEG] s/p FEC x 6 [NSABP B-36]; Femara [Dec 2007-2012]  # Osteoporosis s/p  Reclast; BMD- 2015-wnl- ca+vit D   Dr.Stewart [derm ? Squamous cell s/p freeze-oct 2018]        Carcinoma of overlapping sites of right breast in female, estrogen receptor positive ([place])    MDS (myelodysplastic syndrome) (HCC)    Interval history- Patient complains of abdominal pain. Symptoms began 1 day ago. She describes mild to moderate pain that severely worsened this morning. She states she was 'doubled over' in pain. Yesterday she was having rectal pressure and feeling of needing to have BM. Today, pain improved after BM. Previously, pain localized to RLQ and LLQ of abdomen. She describes recent BM as normal in color, was soft, and normal volume. She also reports cold chills, nausea, headache with standing, poor appetite (worse than usual). She did not take anything for symptoms and symptoms have since resolved. She states she has a history of diverticulitis w/o exacerbations in 'long time'. She does not believe these symptoms have happened before and she has not evaluation for these symptoms previously. She has suffered neutropenia since recent infusion of decitabine.  She has been taking prophylactic acyclovir, Levaquin, and Diflucan.  States she was recently on Augmentin for eye infection that has since resolved.  ECOG FS:1 - Symptomatic but completely ambulatory  Review of systems- Review of Systems  Constitutional: Positive for chills, fever, malaise/fatigue and weight loss. Negative for diaphoresis.  HENT: Negative.   Eyes: Negative.   Respiratory: Negative.   Cardiovascular: Negative.   Gastrointestinal: Positive for abdominal pain, constipation and nausea. Negative for blood in stool, diarrhea, heartburn, melena and vomiting.  Genitourinary: Negative.   Musculoskeletal: Negative.   Skin: Negative.   Neurological: Negative.   Endo/Heme/Allergies: Bruises/bleeds easily.  Psychiatric/Behavioral: Negative.      Current treatment- decitabine (Dacogen) 20mg /m2 (  last 06/14/2017),  pRBCs 1 unit 07/11/2017, platelet transfusion (07/08/2017)  Allergies  Allergen Reactions  . Codeine Anaphylaxis  . Fish-Derived Products Anaphylaxis  . Morphine Sulfate Anaphylaxis    REACTION: anaphylaxis  . Adhesive [Tape]     PAPER TAPE OK TO USE  . Oxycodone     Nausea and vomiting    Past Medical History:  Diagnosis Date  . Blood dyscrasia   . Breast cancer ([place])    right, lumpectomy, radiation, chemo  . Breast cancer ([place])    Right, 2007  . Breast cancer ([place]) 06/20/2016   INVASIVE DUCTAL CARCINOMA.  . Collapsed lung 02/14/2017   RIGHT  . Complication of anesthesia    PT STATED AFTER BIL HIP SURGERIES SHE STOPPED BREATHING THE NIGHT AFTER SURGERY  . COPD (chronic obstructive pulmonary disease) ([place])   . GERD (gastroesophageal reflux disease)   . Headache   . History of chemotherapy   . History of methicillin resistant staphylococcus aureus (MRSA) 2011  . History of radiation therapy   . Hyperlipidemia   . Hypertension   . Liver cancer ([place]) 05/2016  . Lung cancer ([place]) 05/2016  . Osteoarthritis    right hip  . Osteoporosis   . 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 ABDOMINAL HYSTERECTOMY    . BREAST BIOPSY Left 2002   left breast, calcifications  . BREAST BIOPSY Left 06/20/2016   INVASIVE DUCTAL CARCINOMA.  Marland Kitchen BREAST EXCISIONAL BIOPSY Right 2007   positive  . bunion repair    . ESOPHAGOGASTRODUODENOSCOPY (EGD) WITH PROPOFOL N/A 08/05/2016   Procedure: ESOPHAGOGASTRODUODENOSCOPY (EGD) WITH PROPOFOL;  Surgeon: San Jetty, MD;  Location: [facility] ENDOSCOPY;  Service: General;  Laterality: N/A;  . JOINT REPLACEMENT    . left breast biopsy    . NASAL SINUS SURGERY    . PORTACATH PLACEMENT Right 04/13/2017   Procedure: INSERTION PORT-A-CATH- RIGHT INTERNAL JUGULAR;  Surgeon: Robert Bellow, MD;  Location: [facility] ORS;  Service: General;  Laterality: Right;  . right hip replacement    .  SHOULDER SURGERY    . SQUAMOUS CELL CARCINOMA EXCISION     right leg, Dr. Nicole Kindred  . TOTAL HIP ARTHROPLASTY     right    Social History   Socioeconomic History  . Marital status: Married    Spouse name: Not on file  . Number of children: Not on file  . Years of education: Not on file  . Highest education level: Not on file  Occupational History  . Not on file  Social Needs  . Financial resource strain: Not hard at all  . Food insecurity:    Worry: Not on file    Inability: Not on file  . Transportation needs:    Medical: No    Non-medical: No  Tobacco Use  . Smoking status: Current Some Day Smoker    Packs/day: 0.10    Years: 30.00    Pack years: 3.00    Types: Cigarettes  . Smokeless tobacco: Never Used  Substance and Sexual Activity  . Alcohol use: Yes    Alcohol/week: 0.0 oz    Comment: socially  . Drug use: No  . Sexual activity: Never  Lifestyle  . Physical activity:    Days per week: Not on file    Minutes per session: Not on file  . Stress: Not on file  Relationships  . Social connections:    Talks  on phone: Not on file    Gets together: Not on file    Attends religious service: Not on file    Active member of club or organization: Not on file    Attends meetings of clubs or organizations: Not on file    Relationship status: Not on file  . Intimate partner violence:    Fear of current or ex partner: Not on file    Emotionally abused: Not on file    Physically abused: Not on file    Forced sexual activity: Not on file  Other Topics Concern  . Not on file  Social History Narrative  . Not on file    Family History  Problem Relation Age of Onset  . Heart disease Father 71  . Heart disease Mother   . Hyperlipidemia Mother   . Heart disease Brother   . Diabetes Brother   . Diabetes Maternal Grandmother   . Breast cancer Cousin   . Kidney disease Brother     Current Outpatient Medications:  .  acyclovir (ZOVIRAX) 400 MG tablet, Take 1 tablet  (400 mg total) by mouth 2 (two) times daily., Disp: 60 tablet, Rfl: 3 .  ALPRAZolam (XANAX) 0.5 MG tablet, TAKE 1 TABLET BY MOUTH EVERY 8 HOURS AS NEEDED FOR ANXIETY OR SLEEP, Disp: 30 tablet, Rfl: 2 .  amoxicillin-clavulanate (AUGMENTIN) 875-125 MG tablet, Take 1 tablet by mouth 2 (two) times daily., Disp: 14 tablet, Rfl: 0 .  cyanocobalamin (,VITAMIN B-12,) 1000 MCG/ML injection, Inject 1,000 mcg into the muscle every 30 (thirty) days., Disp: , Rfl:  .  ergocalciferol (VITAMIN D2) 50000 units capsule, Take 1 capsule (50,000 Units total) by mouth every Saturday. In the morning., Disp: 12 capsule, Rfl: 0 .  fexofenadine (ALLEGRA) 180 MG tablet, Take 180 mg by mouth every morning. , Disp: , Rfl:  .  fluconazole (DIFLUCAN) 200 MG tablet, Take 1 tablet (200 mg total) by mouth daily., Disp: 30 tablet, Rfl: 3 .  Glucosamine-Chondroitin (COSAMIN DS PO), Take 1 tablet by mouth daily. In the morning., Disp: , Rfl:  .  letrozole (FEMARA) 2.5 MG tablet, Take 1 tablet (2.5 mg total) by mouth daily. Once a day., Disp: 90 tablet, Rfl: 0 .  lidocaine-prilocaine (EMLA) cream, Apply 1 application topically as needed. Apply generously over the Mediport 45 minutes prior to chemotherapy., Disp: 30 g, Rfl: 0 .  magic mouthwash w/lidocaine SOLN, Take 5 mLs by mouth 4 (four) times daily., Disp: 480 mL, Rfl: 3 .  metoprolol succinate (TOPROL-XL) 25 MG 24 hr tablet, Take 1 tablet (25 mg total) by mouth daily., Disp: 90 tablet, Rfl: 3 .  neomycin-polymyxin-pramoxine (NEOSPORIN PLUS) 1 % cream, Apply 1 application topically 2 (two) times daily as needed (Nasal passages as needed for dryness.)., Disp: , Rfl:  .  ondansetron (ZOFRAN) 8 MG tablet, Take 1 tablet by mouth every 8 (eight) hours as needed for nausea/vomiting., Disp: , Rfl:  .  pravastatin (PRAVACHOL) 40 MG tablet, Take 1 tablet (40 mg total) by mouth daily., Disp: 90 tablet, Rfl: 3 .  Probiotic Product (PROBIOTIC & ACIDOPHILUS EX ST PO), Take 1 tablet by mouth daily.  , Disp: , Rfl:  .  ranitidine (ZANTAC) 75 MG tablet, Take 75 mg by mouth as needed. , Disp: , Rfl:  .  vitamin B-12 (CYANOCOBALAMIN) 1000 MCG tablet, Take 1,000 mcg by mouth daily. , Disp: , Rfl:  No current facility-administered medications for this visit.   Facility-Administered Medications Ordered in Other Visits:  .  0.9 %  sodium chloride infusion, , Intravenous, Continuous, Charlaine Dalton R, MD, Stopped at 06/28/17 1450 .  heparin lock flush 100 unit/mL, 500 Units, Intravenous, Once, Brahmanday, Govinda R, MD .  sodium chloride flush (NS) 0.9 % injection 10 mL, 10 mL, Intravenous, PRN, [name], MD  Physical exam:  Vitals:   07/15/17 1144  BP: 123/62  Pulse: (!) 123  Temp: 99.9 F (37.7 C)  TempSrc: Tympanic  SpO2: 100%   Physical Exam  Constitutional: She is oriented to person, place, and time. She appears well-developed and well-nourished. She appears ill. No distress.  HENT:  Head: Normocephalic and atraumatic.  Mouth/Throat: Oropharynx is clear and moist.  Eyes: Pupils are equal, round, and reactive to light. EOM are normal. No scleral icterus.  Cardiovascular: Regular rhythm.  tachycardic  Pulmonary/Chest: Effort normal and breath sounds normal. She has no wheezes. She has no rhonchi.  Abdominal: Soft. Normal appearance and bowel sounds are normal. She exhibits no distension and no ascites. There is no splenomegaly or hepatomegaly. There is tenderness (mild LLQ). There is no rebound, no CVA tenderness, no tenderness at McBurney's point and negative Murphy's sign.  Neurological: She is alert and oriented to person, place, and time.  Skin: Skin is warm and dry. There is pallor.  Psychiatric: She has a normal mood and affect.  Vitals reviewed.    CMP Latest Ref Rng & Units 06/10/2017  Glucose 65 - 99 mg/dL 116(H)  BUN 6 - 20 mg/dL 14  Creatinine 0.44 - 1.00 mg/dL 0.70  Sodium 135 - 145 mmol/L 135  Potassium 3.5 - 5.1 mmol/L 4.3  Chloride 101 - 111  mmol/L 102  CO2 22 - 32 mmol/L 26  Calcium 8.9 - 10.3 mg/dL 9.1  Total Protein 6.5 - 8.1 g/dL 6.2(L)  Total Bilirubin 0.3 - 1.2 mg/dL 0.7  Alkaline Phos 38 - 126 U/L 71  AST 15 - 41 U/L 18  ALT 14 - 54 U/L 17   CBC Latest Ref Rng & Units 07/15/2017  WBC 3.6 - 11.0 K/uL 5.0  Hemoglobin 12.0 - 16.0 g/dL 12.0  Hematocrit 35.0 - 47.0 % 35.3  Platelets 150 - 440 K/uL 131(L)    No images are attached to the encounter.  No results found.   Assessment and plan- Patient is a 72 y.o. female with MDS and metastatic breast cancer who presents to symptom management for abdominal pain.  1. MDS with chronic thrombocytopenia and anemia- s/p Dacogen cycle 1. plt 131, hmg 12.0 today. Has been requiring transfusions 1-2 times per week. Requires irradiated, leukopoor platelets. Plan for transfusion tomorrow. Type and screen drawn today. Will require pre-meds. Dr. Aletha Halim team managing.  2. Metastatic Breast Cancer- s/p lumpectomy, radiation, and chemo. currently on Femara.   3. Chemotherapy Induced Neutropenia- ANC 0.0. Start on on daily Granix. Will check blood counts twice a week. Discussed neutropenic precautions including avoiding sick contacts, regularly monitoring temperature at home. Avoiding tylenol for pain as this may mask a fever and elevated temperatures are significant in setting of neutropenia.    4. Neutropenic Fever- Low grade temp today of 99.9. Tachycardic at 123. Blood cultures did not grow. Lactic acid 1.5 (not elevated). IV fluids given in clinic today. Vital signs improved and temp normalized to 98.4. advised patient that she should go to the emergency room if oral temperature reading 100.5 or greater. Per Dr. Rogue Bussing, given normal lactic acid, negative cultures, and normalization of temperature, he suggests holding off on empiric antibiotics at  this time. If symptoms persist, would consider further work-up.   5. Abdominal Pain & Nausea- Mild LLQ pain to palpation. Per patient,  significant improvement with BM. Perhaps r/t constipation? Would consider imaging if symptoms return.   6. Malnutrition/Anorexia- Discussed importance of adding protein at every meal and consuming nutritional supplements such as boost or Ensure daily or twice daily to ensure adequate calorie intake.  7. Constipation- constipation w/ zofran. Start compazine, continue miralax daily, increase fluid intake. High fiber diet.   8. Mucositis- has previously tolerated Magic Mouthwash well. Reports issue with cost recently d/t some ingredients. She does not require a refill at this time but can speak to pharmacist and revise prescription when refill needed.   Patient advised to monitor temperature at home and given er precautions for temp of 100.5 or greater. Advised if no improvement in symptoms over next 24-48 hours to please call clinic for further evaluation. Follow-up with Dr. Rogue Bussing as scheduled on 07/22/17.   Visit Diagnosis 1. Febrile neutropenia (HCC)   2. Chemotherapy-induced neutropenia (HCC)   3. Carcinoma of overlapping sites of right breast in female, estrogen receptor positive ([place])   4. MDS (myelodysplastic syndrome) ([place])   5. Left lower quadrant pain   6. Anorexia   7. Constipation, unspecified constipation type   8. Mucositis due to chemotherapy     Patient expressed understanding and was in agreement with this plan. She also understands that She can call clinic at any time with any questions, concerns, or complaints.   A total of (25) minutes of face-to-face time was spent with this patient with greater than 50% of that time in counseling and care-coordination.  Beckey Rutter, DNP, AGNP-C [place] [facility] [phone] (work cell) [phone] (office) 07/15/17 6:10 PM

## 2017-07-16 ENCOUNTER — Inpatient Hospital Stay: Payer: Medicare Other

## 2017-07-16 VITALS — BP 120/73 | HR 105 | Temp 97.4°F | Resp 18

## 2017-07-16 DIAGNOSIS — C787 Secondary malignant neoplasm of liver and intrahepatic bile duct: Secondary | ICD-10-CM | POA: Diagnosis not present

## 2017-07-16 DIAGNOSIS — Z170 Estrogen receptor positive status [ER+]: Secondary | ICD-10-CM | POA: Diagnosis not present

## 2017-07-16 DIAGNOSIS — D649 Anemia, unspecified: Secondary | ICD-10-CM | POA: Diagnosis not present

## 2017-07-16 DIAGNOSIS — C50811 Malignant neoplasm of overlapping sites of right female breast: Secondary | ICD-10-CM | POA: Diagnosis not present

## 2017-07-16 DIAGNOSIS — D693 Immune thrombocytopenic purpura: Secondary | ICD-10-CM

## 2017-07-16 DIAGNOSIS — D469 Myelodysplastic syndrome, unspecified: Secondary | ICD-10-CM | POA: Diagnosis not present

## 2017-07-16 DIAGNOSIS — C7800 Secondary malignant neoplasm of unspecified lung: Secondary | ICD-10-CM | POA: Diagnosis not present

## 2017-07-16 DIAGNOSIS — D696 Thrombocytopenia, unspecified: Secondary | ICD-10-CM

## 2017-07-16 MED ORDER — SODIUM CHLORIDE 0.9% FLUSH
10.0000 mL | INTRAVENOUS | Status: DC | PRN
  Administered 2017-07-16: 10 mL via INTRAVENOUS
  Filled 2017-07-16: qty 10

## 2017-07-16 MED ORDER — TBO-FILGRASTIM 480 MCG/0.8ML ~~LOC~~ SOSY
480.0000 ug | PREFILLED_SYRINGE | Freq: Once | SUBCUTANEOUS | Status: AC
  Administered 2017-07-16: 480 ug via SUBCUTANEOUS

## 2017-07-16 MED ORDER — HEPARIN SOD (PORK) LOCK FLUSH 100 UNIT/ML IV SOLN
500.0000 [IU] | Freq: Once | INTRAVENOUS | Status: AC
  Administered 2017-07-16: 500 [IU] via INTRAVENOUS
  Filled 2017-07-16: qty 5

## 2017-07-16 MED ORDER — ACETAMINOPHEN 325 MG PO TABS
650.0000 mg | ORAL_TABLET | Freq: Once | ORAL | Status: AC
  Administered 2017-07-16: 650 mg via ORAL
  Filled 2017-07-16: qty 2

## 2017-07-16 MED ORDER — METHYLPREDNISOLONE SODIUM SUCC 125 MG IJ SOLR
40.0000 mg | Freq: Once | INTRAMUSCULAR | Status: AC
  Administered 2017-07-16: 40 mg via INTRAVENOUS
  Filled 2017-07-16: qty 2

## 2017-07-16 MED ORDER — DIPHENHYDRAMINE HCL 25 MG PO CAPS
25.0000 mg | ORAL_CAPSULE | Freq: Once | ORAL | Status: AC
  Administered 2017-07-16: 25 mg via ORAL
  Filled 2017-07-16: qty 1

## 2017-07-16 MED ORDER — SODIUM CHLORIDE 0.9 % IV SOLN
250.0000 mL | Freq: Once | INTRAVENOUS | Status: AC
  Administered 2017-07-16: 250 mL via INTRAVENOUS
  Filled 2017-07-16: qty 250

## 2017-07-17 ENCOUNTER — Inpatient Hospital Stay: Payer: Medicare Other

## 2017-07-17 DIAGNOSIS — Z170 Estrogen receptor positive status [ER+]: Secondary | ICD-10-CM

## 2017-07-17 DIAGNOSIS — D693 Immune thrombocytopenic purpura: Secondary | ICD-10-CM

## 2017-07-17 DIAGNOSIS — D649 Anemia, unspecified: Secondary | ICD-10-CM | POA: Diagnosis not present

## 2017-07-17 DIAGNOSIS — C50811 Malignant neoplasm of overlapping sites of right female breast: Secondary | ICD-10-CM | POA: Diagnosis not present

## 2017-07-17 DIAGNOSIS — C787 Secondary malignant neoplasm of liver and intrahepatic bile duct: Secondary | ICD-10-CM | POA: Diagnosis not present

## 2017-07-17 DIAGNOSIS — C7800 Secondary malignant neoplasm of unspecified lung: Secondary | ICD-10-CM | POA: Diagnosis not present

## 2017-07-17 DIAGNOSIS — D469 Myelodysplastic syndrome, unspecified: Secondary | ICD-10-CM | POA: Diagnosis not present

## 2017-07-17 LAB — BPAM PLATELET PHERESIS
Blood Product Expiration Date: 201905071430
ISSUE DATE / TIME: 201905071346
Unit Type and Rh: 5100

## 2017-07-17 LAB — PREPARE PLATELET PHERESIS: UNIT DIVISION: 0

## 2017-07-17 MED ORDER — TBO-FILGRASTIM 480 MCG/0.8ML ~~LOC~~ SOSY
480.0000 ug | PREFILLED_SYRINGE | Freq: Once | SUBCUTANEOUS | Status: AC
  Administered 2017-07-17: 480 ug via SUBCUTANEOUS

## 2017-07-18 ENCOUNTER — Inpatient Hospital Stay: Payer: Medicare Other

## 2017-07-18 ENCOUNTER — Other Ambulatory Visit: Payer: Self-pay | Admitting: *Deleted

## 2017-07-18 DIAGNOSIS — C50811 Malignant neoplasm of overlapping sites of right female breast: Secondary | ICD-10-CM

## 2017-07-18 DIAGNOSIS — Z170 Estrogen receptor positive status [ER+]: Secondary | ICD-10-CM

## 2017-07-18 DIAGNOSIS — D469 Myelodysplastic syndrome, unspecified: Secondary | ICD-10-CM

## 2017-07-18 DIAGNOSIS — D693 Immune thrombocytopenic purpura: Secondary | ICD-10-CM

## 2017-07-18 LAB — CBC WITH DIFFERENTIAL/PLATELET
Basophils Absolute: 0 10*3/uL (ref 0–0.1)
Basophils Relative: 0 %
EOS ABS: 0 10*3/uL (ref 0–0.7)
EOS PCT: 0 %
HCT: 23.3 % — ABNORMAL LOW (ref 35.0–47.0)
Hemoglobin: 8.2 g/dL — ABNORMAL LOW (ref 12.0–16.0)
Lymphocytes Relative: 93 %
Lymphs Abs: 0.6 10*3/uL — ABNORMAL LOW (ref 1.0–3.6)
MCH: 29.7 pg (ref 26.0–34.0)
MCHC: 35.4 g/dL (ref 32.0–36.0)
MCV: 83.8 fL (ref 80.0–100.0)
Monocytes Absolute: 0 10*3/uL — ABNORMAL LOW (ref 0.2–0.9)
Monocytes Relative: 1 %
NEUTROS PCT: 6 %
Neutro Abs: 0 10*3/uL — ABNORMAL LOW (ref 1.4–6.5)
PLATELETS: 16 10*3/uL — AB (ref 150–400)
RBC: 2.77 MIL/uL — AB (ref 3.80–5.20)
RDW: 14.1 % (ref 11.5–14.5)
WBC: 0.7 10*3/uL — AB (ref 3.6–11.0)

## 2017-07-18 LAB — SAMPLE TO BLOOD BANK

## 2017-07-18 MED ORDER — TBO-FILGRASTIM 480 MCG/0.8ML ~~LOC~~ SOSY
480.0000 ug | PREFILLED_SYRINGE | Freq: Once | SUBCUTANEOUS | Status: AC
  Administered 2017-07-18: 480 ug via SUBCUTANEOUS

## 2017-07-19 ENCOUNTER — Inpatient Hospital Stay: Payer: Medicare Other

## 2017-07-19 VITALS — BP 146/73 | HR 102 | Temp 98.9°F | Resp 18

## 2017-07-19 DIAGNOSIS — C50811 Malignant neoplasm of overlapping sites of right female breast: Secondary | ICD-10-CM

## 2017-07-19 DIAGNOSIS — D693 Immune thrombocytopenic purpura: Secondary | ICD-10-CM

## 2017-07-19 DIAGNOSIS — D469 Myelodysplastic syndrome, unspecified: Secondary | ICD-10-CM

## 2017-07-19 DIAGNOSIS — Z170 Estrogen receptor positive status [ER+]: Secondary | ICD-10-CM

## 2017-07-19 MED ORDER — TBO-FILGRASTIM 480 MCG/0.8ML ~~LOC~~ SOSY
480.0000 ug | PREFILLED_SYRINGE | Freq: Once | SUBCUTANEOUS | Status: AC
  Administered 2017-07-19: 480 ug via SUBCUTANEOUS

## 2017-07-19 MED ORDER — METHYLPREDNISOLONE SODIUM SUCC 125 MG IJ SOLR
40.0000 mg | Freq: Once | INTRAMUSCULAR | Status: AC
  Administered 2017-07-19: 40 mg via INTRAVENOUS
  Filled 2017-07-19: qty 2

## 2017-07-19 MED ORDER — DIPHENHYDRAMINE HCL 25 MG PO CAPS
25.0000 mg | ORAL_CAPSULE | Freq: Once | ORAL | Status: AC
  Administered 2017-07-19: 25 mg via ORAL
  Filled 2017-07-19: qty 1

## 2017-07-19 MED ORDER — HEPARIN SOD (PORK) LOCK FLUSH 100 UNIT/ML IV SOLN
500.0000 [IU] | Freq: Every day | INTRAVENOUS | Status: AC | PRN
  Administered 2017-07-19: 500 [IU]
  Filled 2017-07-19: qty 5

## 2017-07-19 MED ORDER — SODIUM CHLORIDE 0.9 % IV SOLN
250.0000 mL | Freq: Once | INTRAVENOUS | Status: AC
  Administered 2017-07-19: 250 mL via INTRAVENOUS
  Filled 2017-07-19: qty 250

## 2017-07-19 MED ORDER — SODIUM CHLORIDE 0.9% FLUSH
10.0000 mL | INTRAVENOUS | Status: DC | PRN
  Filled 2017-07-19: qty 10

## 2017-07-19 MED ORDER — ACETAMINOPHEN 325 MG PO TABS
650.0000 mg | ORAL_TABLET | Freq: Once | ORAL | Status: AC
  Administered 2017-07-19: 650 mg via ORAL
  Filled 2017-07-19: qty 2

## 2017-07-20 LAB — CULTURE, BLOOD (ROUTINE X 2)
Culture: NO GROWTH
Culture: NO GROWTH

## 2017-07-20 LAB — BPAM PLATELET PHERESIS
BLOOD PRODUCT EXPIRATION DATE: 201905101430
ISSUE DATE / TIME: 201905101411
UNIT TYPE AND RH: 5100

## 2017-07-20 LAB — PREPARE PLATELET PHERESIS: Unit division: 0

## 2017-07-21 ENCOUNTER — Other Ambulatory Visit: Payer: Self-pay

## 2017-07-21 ENCOUNTER — Encounter: Payer: Self-pay | Admitting: Emergency Medicine

## 2017-07-21 ENCOUNTER — Inpatient Hospital Stay
Admission: EM | Admit: 2017-07-21 | Discharge: 2017-07-25 | DRG: 872 | Disposition: A | Discharge status: Home / Self Care | Payer: Medicare Other | Attending: Internal Medicine | Admitting: Internal Medicine

## 2017-07-21 ENCOUNTER — Emergency Department: Payer: Medicare Other

## 2017-07-21 DIAGNOSIS — E785 Hyperlipidemia, unspecified: Secondary | ICD-10-CM | POA: Diagnosis present

## 2017-07-21 DIAGNOSIS — C7800 Secondary malignant neoplasm of unspecified lung: Secondary | ICD-10-CM | POA: Diagnosis present

## 2017-07-21 DIAGNOSIS — Z96641 Presence of right artificial hip joint: Secondary | ICD-10-CM | POA: Diagnosis present

## 2017-07-21 DIAGNOSIS — Z170 Estrogen receptor positive status [ER+]: Secondary | ICD-10-CM

## 2017-07-21 DIAGNOSIS — Z90710 Acquired absence of both cervix and uterus: Secondary | ICD-10-CM

## 2017-07-21 DIAGNOSIS — D6959 Other secondary thrombocytopenia: Secondary | ICD-10-CM | POA: Diagnosis present

## 2017-07-21 DIAGNOSIS — M810 Age-related osteoporosis without current pathological fracture: Secondary | ICD-10-CM | POA: Diagnosis present

## 2017-07-21 DIAGNOSIS — R509 Fever, unspecified: Secondary | ICD-10-CM | POA: Diagnosis present

## 2017-07-21 DIAGNOSIS — Z8505 Personal history of malignant neoplasm of liver: Secondary | ICD-10-CM

## 2017-07-21 DIAGNOSIS — Z91013 Allergy to seafood: Secondary | ICD-10-CM

## 2017-07-21 DIAGNOSIS — I10 Essential (primary) hypertension: Secondary | ICD-10-CM | POA: Diagnosis present

## 2017-07-21 DIAGNOSIS — D701 Agranulocytosis secondary to cancer chemotherapy: Secondary | ICD-10-CM

## 2017-07-21 DIAGNOSIS — F17210 Nicotine dependence, cigarettes, uncomplicated: Secondary | ICD-10-CM | POA: Diagnosis present

## 2017-07-21 DIAGNOSIS — C7981 Secondary malignant neoplasm of breast: Secondary | ICD-10-CM | POA: Diagnosis present

## 2017-07-21 DIAGNOSIS — T451X5A Adverse effect of antineoplastic and immunosuppressive drugs, initial encounter: Secondary | ICD-10-CM | POA: Diagnosis present

## 2017-07-21 DIAGNOSIS — D638 Anemia in other chronic diseases classified elsewhere: Secondary | ICD-10-CM | POA: Diagnosis present

## 2017-07-21 DIAGNOSIS — Z79811 Long term (current) use of aromatase inhibitors: Secondary | ICD-10-CM | POA: Diagnosis not present

## 2017-07-21 DIAGNOSIS — Z9221 Personal history of antineoplastic chemotherapy: Secondary | ICD-10-CM

## 2017-07-21 DIAGNOSIS — D709 Neutropenia, unspecified: Secondary | ICD-10-CM | POA: Diagnosis present

## 2017-07-21 DIAGNOSIS — Z885 Allergy status to narcotic agent status: Secondary | ICD-10-CM

## 2017-07-21 DIAGNOSIS — Z923 Personal history of irradiation: Secondary | ICD-10-CM | POA: Diagnosis not present

## 2017-07-21 DIAGNOSIS — R5081 Fever presenting with conditions classified elsewhere: Secondary | ICD-10-CM | POA: Diagnosis present

## 2017-07-21 DIAGNOSIS — K219 Gastro-esophageal reflux disease without esophagitis: Secondary | ICD-10-CM | POA: Diagnosis not present

## 2017-07-21 DIAGNOSIS — I472 Ventricular tachycardia: Secondary | ICD-10-CM | POA: Diagnosis not present

## 2017-07-21 DIAGNOSIS — D469 Myelodysplastic syndrome, unspecified: Secondary | ICD-10-CM | POA: Diagnosis present

## 2017-07-21 DIAGNOSIS — R0602 Shortness of breath: Secondary | ICD-10-CM | POA: Diagnosis not present

## 2017-07-21 DIAGNOSIS — D649 Anemia, unspecified: Secondary | ICD-10-CM | POA: Diagnosis not present

## 2017-07-21 DIAGNOSIS — A419 Sepsis, unspecified organism: Secondary | ICD-10-CM | POA: Diagnosis not present

## 2017-07-21 DIAGNOSIS — Z8614 Personal history of Methicillin resistant Staphylococcus aureus infection: Secondary | ICD-10-CM

## 2017-07-21 DIAGNOSIS — Z79899 Other long term (current) drug therapy: Secondary | ICD-10-CM

## 2017-07-21 DIAGNOSIS — D696 Thrombocytopenia, unspecified: Secondary | ICD-10-CM | POA: Diagnosis not present

## 2017-07-21 DIAGNOSIS — Z95828 Presence of other vascular implants and grafts: Secondary | ICD-10-CM

## 2017-07-21 DIAGNOSIS — Z8249 Family history of ischemic heart disease and other diseases of the circulatory system: Secondary | ICD-10-CM

## 2017-07-21 DIAGNOSIS — J449 Chronic obstructive pulmonary disease, unspecified: Secondary | ICD-10-CM | POA: Diagnosis present

## 2017-07-21 DIAGNOSIS — R630 Anorexia: Secondary | ICD-10-CM | POA: Diagnosis not present

## 2017-07-21 DIAGNOSIS — T82594A Other mechanical complication of infusion catheter, initial encounter: Secondary | ICD-10-CM | POA: Diagnosis not present

## 2017-07-21 DIAGNOSIS — Z91048 Other nonmedicinal substance allergy status: Secondary | ICD-10-CM

## 2017-07-21 DIAGNOSIS — Z803 Family history of malignant neoplasm of breast: Secondary | ICD-10-CM

## 2017-07-21 DIAGNOSIS — R000 Tachycardia, unspecified: Secondary | ICD-10-CM | POA: Diagnosis not present

## 2017-07-21 DIAGNOSIS — C50811 Malignant neoplasm of overlapping sites of right female breast: Secondary | ICD-10-CM | POA: Diagnosis not present

## 2017-07-21 DIAGNOSIS — C50919 Malignant neoplasm of unspecified site of unspecified female breast: Secondary | ICD-10-CM | POA: Diagnosis not present

## 2017-07-21 LAB — COMPREHENSIVE METABOLIC PANEL
ALBUMIN: 2.8 g/dL — AB (ref 3.5–5.0)
ALT: 21 U/L (ref 14–54)
AST: 18 U/L (ref 15–41)
Alkaline Phosphatase: 57 U/L (ref 38–126)
Anion gap: 9 (ref 5–15)
BILIRUBIN TOTAL: 0.5 mg/dL (ref 0.3–1.2)
BUN: 19 mg/dL (ref 6–20)
CHLORIDE: 98 mmol/L — AB (ref 101–111)
CO2: 26 mmol/L (ref 22–32)
Calcium: 8.7 mg/dL — ABNORMAL LOW (ref 8.9–10.3)
Creatinine, Ser: 0.7 mg/dL (ref 0.44–1.00)
GFR calc Af Amer: >60 mL/min (ref >60)
GLUCOSE: 124 mg/dL — AB (ref 65–99)
Potassium: 4.1 mmol/L (ref 3.5–5.1)
Sodium: 133 mmol/L — ABNORMAL LOW (ref 135–145)
TOTAL PROTEIN: 6.5 g/dL (ref 6.5–8.1)

## 2017-07-21 LAB — CBC WITH DIFFERENTIAL/PLATELET
BASOS ABS: 0 10*3/uL (ref 0–0.1)
BASOS PCT: 0 %
Eosinophils Absolute: 0 10*3/uL (ref 0–0.7)
Eosinophils Relative: 0 %
HEMATOCRIT: 22.7 % — AB (ref 35.0–47.0)
HEMOGLOBIN: 7.9 g/dL — AB (ref 12.0–16.0)
LYMPHS ABS: 0.9 10*3/uL — AB (ref 1.0–3.6)
LYMPHS PCT: 91 %
MCH: 29.1 pg (ref 26.0–34.0)
MCHC: 34.7 g/dL (ref 32.0–36.0)
MCV: 83.9 fL (ref 80.0–100.0)
MONOS PCT: 2 %
Monocytes Absolute: 0 10*3/uL — ABNORMAL LOW (ref 0.2–0.9)
NEUTROS ABS: 0.1 10*3/uL — AB (ref 1.4–6.5)
Neutrophils Relative %: 7 %
Platelets: 12 10*3/uL — CL (ref 150–440)
RBC: 2.71 MIL/uL — ABNORMAL LOW (ref 3.80–5.20)
RDW: 14 % (ref 11.5–14.5)
WBC: 1 10*3/uL — CL (ref 3.6–11.0)

## 2017-07-21 LAB — URINALYSIS, COMPLETE (UACMP) WITH MICROSCOPIC
BACTERIA UA: NONE SEEN
Bilirubin Urine: NEGATIVE
GLUCOSE, UA: NEGATIVE mg/dL
HGB URINE DIPSTICK: NEGATIVE
KETONES UR: NEGATIVE mg/dL
Leukocytes, UA: NEGATIVE
NITRITE: NEGATIVE
PH: 7 (ref 5.0–8.0)
Protein, ur: NEGATIVE mg/dL
Specific Gravity, Urine: 1.017 (ref 1.005–1.030)
Squamous Epithelial / LPF: NONE SEEN (ref 0–5)

## 2017-07-21 LAB — LACTIC ACID, PLASMA: LACTIC ACID, VENOUS: 1.6 mmol/L (ref 0.5–1.9)

## 2017-07-21 LAB — PROTIME-INR
INR: 1.13
PROTHROMBIN TIME: 14.4 s (ref 11.4–15.2)

## 2017-07-21 LAB — TROPONIN I: Troponin I: <0.03 ng/mL (ref <0.03)

## 2017-07-21 LAB — MRSA PCR SCREENING: MRSA by PCR: NEGATIVE

## 2017-07-21 MED ORDER — CALCIUM GLUCONATE 10 % IV SOLN: 1.0000 g | Freq: Once | INTRAVENOUS | Status: DC

## 2017-07-21 MED ORDER — PIPERACILLIN-TAZOBACTAM 3.375 G IVPB 30 MIN
3.3750 g | Freq: Once | INTRAVENOUS | Status: AC
  Administered 2017-07-21: 3.375 g via INTRAVENOUS
  Filled 2017-07-21: qty 50

## 2017-07-21 MED ORDER — VANCOMYCIN HCL IN DEXTROSE 750-5 MG/150ML-% IV SOLN
750.0000 mg | INTRAVENOUS | Status: DC
Start: 2017-07-22 — End: 2017-07-25
  Administered 2017-07-22 – 2017-07-25 (×4): 750 mg via INTRAVENOUS
  Filled 2017-07-21 (×4): qty 150

## 2017-07-21 MED ORDER — SODIUM CHLORIDE 0.9 % IV BOLUS
1000.0000 mL | Freq: Once | INTRAVENOUS | Status: AC
  Administered 2017-07-21: 1000 mL via INTRAVENOUS

## 2017-07-21 MED ORDER — ALPRAZOLAM 0.5 MG PO TABS
0.5000 mg | ORAL_TABLET | Freq: Every evening | ORAL | Status: DC | PRN
  Administered 2017-07-21 – 2017-07-24 (×3): 0.5 mg via ORAL
  Filled 2017-07-21 (×3): qty 1

## 2017-07-21 MED ORDER — LORATADINE 10 MG PO TABS
10.0000 mg | ORAL_TABLET | Freq: Every day | ORAL | Status: DC
  Administered 2017-07-22 – 2017-07-25 (×4): 10 mg via ORAL
  Filled 2017-07-21 (×4): qty 1

## 2017-07-21 MED ORDER — SODIUM CHLORIDE 0.9 % IV SOLN
INTRAVENOUS | Status: DC
  Administered 2017-07-21 – 2017-07-22 (×2): via INTRAVENOUS

## 2017-07-21 MED ORDER — ONDANSETRON HCL 4 MG PO TABS: 4.0000 mg | ORAL_TABLET | Freq: Four times a day (QID) | ORAL | Status: DC | PRN

## 2017-07-21 MED ORDER — PIPERACILLIN-TAZOBACTAM 3.375 G IVPB
3.3750 g | Freq: Three times a day (TID) | INTRAVENOUS | Status: DC
  Administered 2017-07-21 – 2017-07-25 (×11): 3.375 g via INTRAVENOUS
  Filled 2017-07-21 (×11): qty 50

## 2017-07-21 MED ORDER — BISACODYL 5 MG PO TBEC: 5.0000 mg | DELAYED_RELEASE_TABLET | Freq: Every day | ORAL | Status: DC | PRN

## 2017-07-21 MED ORDER — LETROZOLE 2.5 MG PO TABS
2.5000 mg | ORAL_TABLET | Freq: Every day | ORAL | Status: DC
  Administered 2017-07-22 – 2017-07-25 (×4): 2.5 mg via ORAL
  Filled 2017-07-21 (×4): qty 1

## 2017-07-21 MED ORDER — ACETAMINOPHEN 650 MG RE SUPP: 650.0000 mg | Freq: Four times a day (QID) | RECTAL | Status: DC | PRN

## 2017-07-21 MED ORDER — TBO-FILGRASTIM 480 MCG/0.8ML ~~LOC~~ SOSY
480.0000 ug | PREFILLED_SYRINGE | Freq: Every day | SUBCUTANEOUS | Status: DC
  Administered 2017-07-21 – 2017-07-25 (×5): 480 ug via SUBCUTANEOUS
  Filled 2017-07-21 (×7): qty 0.8

## 2017-07-21 MED ORDER — VANCOMYCIN HCL IN DEXTROSE 1-5 GM/200ML-% IV SOLN
1000.0000 mg | Freq: Once | INTRAVENOUS | Status: AC
  Administered 2017-07-21: 1000 mg via INTRAVENOUS
  Filled 2017-07-21: qty 200

## 2017-07-21 MED ORDER — IBUPROFEN 400 MG PO TABS: 400.0000 mg | ORAL_TABLET | Freq: Four times a day (QID) | ORAL | Status: DC | PRN

## 2017-07-21 MED ORDER — ONDANSETRON HCL 4 MG/2ML IJ SOLN: 4.0000 mg | Freq: Four times a day (QID) | INTRAMUSCULAR | Status: DC | PRN

## 2017-07-21 MED ORDER — FAMOTIDINE 20 MG PO TABS
20.0000 mg | ORAL_TABLET | Freq: Every day | ORAL | Status: DC
  Administered 2017-07-22 – 2017-07-25 (×4): 20 mg via ORAL
  Filled 2017-07-21 (×4): qty 1

## 2017-07-21 MED ORDER — POLYETHYLENE GLYCOL 3350 17 G PO PACK: 17.0000 g | PACK | Freq: Every day | ORAL | Status: DC | PRN

## 2017-07-21 MED ORDER — PRAVASTATIN SODIUM 20 MG PO TABS
40.0000 mg | ORAL_TABLET | Freq: Every day | ORAL | Status: DC
  Administered 2017-07-21 – 2017-07-24 (×4): 40 mg via ORAL
  Filled 2017-07-21 (×2): qty 2
  Filled 2017-07-21: qty 1
  Filled 2017-07-21 (×2): qty 2

## 2017-07-21 MED ORDER — METOPROLOL SUCCINATE ER 25 MG PO TB24
25.0000 mg | ORAL_TABLET | Freq: Every day | ORAL | Status: DC
  Administered 2017-07-22 – 2017-07-25 (×4): 25 mg via ORAL
  Filled 2017-07-21 (×4): qty 1

## 2017-07-21 MED ORDER — ACETAMINOPHEN 325 MG PO TABS
650.0000 mg | ORAL_TABLET | Freq: Four times a day (QID) | ORAL | Status: DC | PRN
  Administered 2017-07-22 – 2017-07-23 (×2): 650 mg via ORAL
  Filled 2017-07-21 (×2): qty 2

## 2017-07-21 NOTE — H&P (Signed)
[facility] [place] NAME: Shelly Arias    MR#:  [id number]  DATE OF BIRTH:  1945/03/17  DATE OF ADMISSION:  07/21/2017  PRIMARY CARE PHYSICIAN: [name], MD   REQUESTING/REFERRING PHYSICIAN: dr Dineen Kid  CHIEF COMPLAINT:   fever HISTORY OF PRESENT ILLNESS:  Shelly Arias  is a 72 y.o. female with a known history of MDS and metastatic breast cancer on Femara who presents today to the emergency room due to fever of greater than 100.5. Patient reports that she has been on new chemo agent since April and since that time she has been receiving weekly transfusions of platelets and blood. She has also been on prophylaxis acyclovir, Levaquin and Diflucan since April.  She was recently on Augmentin for an eye infection which is now cleared up.  She denies cough, chills, abdominal pain, dysuria.  PAST MEDICAL HISTORY:   Past Medical History:  Diagnosis Date  . Blood dyscrasia   . Breast cancer ([place])    right, lumpectomy, radiation, chemo  . Breast cancer ([place])    Right, 2007  . Breast cancer ([place]) 06/20/2016   INVASIVE DUCTAL CARCINOMA.  . Collapsed lung 02/14/2017   RIGHT  . Complication of anesthesia    PT STATED AFTER BIL HIP SURGERIES SHE STOPPED BREATHING THE NIGHT AFTER SURGERY  . COPD (chronic obstructive pulmonary disease) ([place])   . GERD (gastroesophageal reflux disease)   . Headache   . History of chemotherapy   . History of methicillin resistant staphylococcus aureus (MRSA) 2011  . History of radiation therapy   . Hyperlipidemia   . Hypertension   . Liver cancer ([place]) 05/2016  . Lung cancer ([place]) 05/2016  . Osteoarthritis    right hip  . Osteoporosis   . Squamous cell carcinoma    leg, Followed by Dr. Nicole Kindred    PAST SURGICAL HISTORY:   Past Surgical History:  Procedure Laterality Date  . ABDOMINAL HYSTERECTOMY    . BREAST BIOPSY Left 2002   left breast, calcifications  . BREAST BIOPSY Left 06/20/2016   INVASIVE DUCTAL CARCINOMA.  Marland Kitchen BREAST EXCISIONAL BIOPSY Right 2007   positive  . bunion repair    . ESOPHAGOGASTRODUODENOSCOPY (EGD) WITH PROPOFOL N/A 08/05/2016   Procedure: ESOPHAGOGASTRODUODENOSCOPY (EGD) WITH PROPOFOL;  Surgeon: San Jetty, MD;  Location: [facility] ENDOSCOPY;  Service: General;  Laterality: N/A;  . JOINT REPLACEMENT    . left breast biopsy    . NASAL SINUS SURGERY    . PORTACATH PLACEMENT Right 04/13/2017   Procedure: INSERTION PORT-A-CATH- RIGHT INTERNAL JUGULAR;  Surgeon: Robert Bellow, MD;  Location: [facility] ORS;  Service: General;  Laterality: Right;  . right hip replacement    . SHOULDER SURGERY    . SQUAMOUS CELL CARCINOMA EXCISION     right leg, Dr. Nicole Kindred  . TOTAL HIP ARTHROPLASTY     right    SOCIAL HISTORY:   Social History   Tobacco Use  . Smoking status: Current Some Day Smoker    Packs/day: 0.10    Years: 30.00    Pack years: 3.00    Types: Cigarettes  . Smokeless tobacco: Never Used  Substance Use Topics  . Alcohol use: Yes    Alcohol/week: 0.0 oz    Comment: socially    FAMILY HISTORY:   Family History  Problem Relation Age of Onset  . Heart disease Father 76  . Heart disease Mother   . Hyperlipidemia Mother   . Heart disease Brother   .  Diabetes Brother   . Diabetes Maternal Grandmother   . Breast cancer Cousin   . Kidney disease Brother     DRUG ALLERGIES:   Allergies  Allergen Reactions  . Codeine Anaphylaxis  . Fish-Derived Products Anaphylaxis  . Morphine Sulfate Anaphylaxis    REACTION: anaphylaxis  . Adhesive [Tape]     PAPER TAPE OK TO USE  . Oxycodone     Nausea and vomiting    REVIEW OF SYSTEMS:   Review of Systems  Constitutional: Positive for fever. Negative for chills and malaise/fatigue.  HENT: Negative.  Negative for ear discharge, ear pain, hearing loss, nosebleeds and sore throat.   Eyes: Negative.  Negative for blurred vision and pain.  Respiratory: Negative.  Negative for cough, hemoptysis,  shortness of breath and wheezing.   Cardiovascular: Negative.  Negative for chest pain, palpitations and leg swelling.  Gastrointestinal: Negative.  Negative for abdominal pain, blood in stool, diarrhea, nausea and vomiting.  Genitourinary: Negative.  Negative for dysuria.  Musculoskeletal: Negative.  Negative for back pain.  Skin: Negative.   Neurological: Negative for dizziness, tremors, speech change, focal weakness, seizures and headaches.  Endo/Heme/Allergies: Negative.  Does not bruise/bleed easily.  Psychiatric/Behavioral: Negative.  Negative for depression, hallucinations and suicidal ideas.    MEDICATIONS AT HOME:   Prior to Admission medications   Medication Sig Start Date End Date Taking? Authorizing Provider  acyclovir (ZOVIRAX) 400 MG tablet Take 1 tablet (400 mg total) by mouth 2 (two) times daily. 06/24/17   [name], MD  ALPRAZolam Duanne Moron) 0.5 MG tablet TAKE 1 TABLET BY MOUTH EVERY 8 HOURS AS NEEDED FOR ANXIETY OR SLEEP 02/28/17   Verlon Au, NP  amoxicillin-clavulanate (AUGMENTIN) 875-125 MG tablet Take 1 tablet by mouth 2 (two) times daily. 07/05/17   Jacquelin Hawking, NP  cyanocobalamin (,VITAMIN B-12,) 1000 MCG/ML injection Inject 1,000 mcg into the muscle every 30 (thirty) days.    [provider]  ergocalciferol (VITAMIN D2) 50000 units capsule Take 1 capsule (50,000 Units total) by mouth every Saturday. In the morning. 05/25/17   [name], MD  fexofenadine (ALLEGRA) 180 MG tablet Take 180 mg by mouth every morning.     [provider]  fluconazole (DIFLUCAN) 200 MG tablet Take 1 tablet (200 mg total) by mouth daily. 06/24/17   [name], MD  Glucosamine-Chondroitin (COSAMIN DS PO) Take 1 tablet by mouth daily. In the morning.    [provider]  letrozole (FEMARA) 2.5 MG tablet Take 1 tablet (2.5 mg total) by mouth daily. Once a day. 06/20/17   [name], MD  lidocaine-prilocaine (EMLA) cream  Apply 1 application topically as needed. Apply generously over the Mediport 45 minutes prior to chemotherapy. 04/15/17   [name], MD  magic mouthwash w/lidocaine SOLN Take 5 mLs by mouth 4 (four) times daily. 07/04/17   Jacquelin Hawking, NP  metoprolol succinate (TOPROL-XL) 25 MG 24 hr tablet Take 1 tablet (25 mg total) by mouth daily. 05/02/17   [name], MD  neomycin-polymyxin-pramoxine (NEOSPORIN PLUS) 1 % cream Apply 1 application topically 2 (two) times daily as needed (Nasal passages as needed for dryness.).    [provider]  ondansetron (ZOFRAN) 8 MG tablet Take 1 tablet by mouth every 8 (eight) hours as needed for nausea/vomiting. 07/20/16   [provider]  pravastatin (PRAVACHOL) 40 MG tablet Take 1 tablet (40 mg total) by mouth daily. 05/02/17   [name], MD  Probiotic Product (PROBIOTIC & ACIDOPHILUS EX ST PO) Take 1 tablet by mouth daily.     [provider]  prochlorperazine (COMPAZINE) 10 MG tablet Take 1 tablet (10 mg total) by mouth every 6 (six) hours as needed for nausea or vomiting. 07/15/17   Verlon Au, NP  ranitidine (ZANTAC) 75 MG tablet Take 75 mg by mouth as needed.     [provider]  vitamin B-12 (CYANOCOBALAMIN) 1000 MCG tablet Take 1,000 mcg by mouth daily.     [provider]      VITAL SIGNS:  Blood pressure (!) 139/95, pulse (!) 140, temperature (!) 100.8 F (38.2 C), temperature source Oral, resp. rate 18, height 5\' 1"  (1.549 m), weight 60.3 kg (133 lb), SpO2 96 %.  PHYSICAL EXAMINATION:   Physical Exam  Constitutional: She is oriented to person, place, and time. No distress.  HENT:  Head: Normocephalic.  Eyes: No scleral icterus.  Neck: Normal range of motion. Neck supple. No JVD present. No tracheal deviation present.  Cardiovascular: Normal rate, regular rhythm and normal heart sounds. Exam reveals no gallop and no friction rub.  No murmur heard. Pulmonary/Chest: Effort  normal and breath sounds normal. No respiratory distress. She has no wheezes. She has no rales. She exhibits no tenderness.  Abdominal: Soft. Bowel sounds are normal. She exhibits no distension and no mass. There is no tenderness. There is no rebound and no guarding.  Musculoskeletal: Normal range of motion. She exhibits no edema.  Neurological: She is alert and oriented to person, place, and time.  Skin: Skin is warm. No rash noted. No erythema.  Psychiatric: Judgment normal.      LABORATORY PANEL:   CBC Recent Labs  Lab 07/18/17 1420  WBC 0.7*  HGB 8.2*  HCT 23.3*  PLT 16*   ------------------------------------------------------------------------------------------------------------------  Chemistries  Recent Labs  Lab 07/15/17 1221  NA 129*  K 4.1  CL 95*  CO2 22  GLUCOSE 122*  BUN 17  CREATININE 0.95  CALCIUM 8.7*  AST 33  ALT 28  ALKPHOS 62  BILITOT 0.7   ------------------------------------------------------------------------------------------------------------------  Cardiac Enzymes No results for input(s): TROPONINI in the last 168 hours. ------------------------------------------------------------------------------------------------------------------  RADIOLOGY:  No results found.  EKG:   Sinus tachycardia with Q waves in the anterior leads which are old  IMPRESSION AND PLAN:   72 year old female with a history of MDS and metastatic breast cancer on Femara Who presents to the emergency room due to fever.  1.  Neutropenic fever: Patient will continue on empiric Zosyn and vancomycin. Follow-up on UA, blood cultures and urine culture Follow-up CBC in a.m. Oncology consultation requested Dr. B will order medication for neutropenia. For now I will discontinue acyclovir, Diflucan Oncology may reorder these if indicated.  2.  Metastatic breast cancer: Continue Femara  3.  MDS with chronic thrombocytopenia and anemia: Patient needs irradiated,  leuko-poor platelets which come from [place].  4.  Essential hypertension: Continue metoprolol   All the records are reviewed and case discussed with ED provider. Management plans discussed with the patient and she is in agreement  CODE STATUS: full  TOTAL TIME TAKING CARE OF THIS PATIENT: 40 minutes.    Stiven Kaspar M.D on 07/21/2017 at 3:48 PM  Between 7am to 6pm - Pager - [phone]  After 6pm go to [email or web address] - password [email or web address]  Office  [phone]  CC: Primary care physician; [name], MD

## 2017-07-21 NOTE — ED Notes (Signed)
Date and time results received: 07/21/17 1640 (use smartphrase ".now" to insert current time)  Test: WBC, platelets, neutro abs Critical Value: 1.0, 12, 0.1 respectively  Name of Provider Notified: Dr. Clearnce Hasten  Orders Received? Or Actions Taken?: no new orders at this time

## 2017-07-21 NOTE — ED Provider Notes (Addendum)
[facility] Emergency Department Provider Note  ___________________________________________   First MD Initiated Contact with Patient 07/21/17 1545     (approximate)  I have reviewed the triage vital signs and the nursing notes.   HISTORY  Chief Complaint Cancer and Fever   HPI Shelly Arias is a 72 y.o. female with a history of myelodysplastic syndrome, metastatic breast cancer as well as neutropenia on daily Levaquin, Diflucan and acyclovir who is presenting with fever.  She says that she began having chills and fever this morning.  Came to the emergency department as instructed by her oncologist, Dr. Rogue Bussing.  Patient denies any new cough or runny nose.  Denies any pain.  Denies any burning with urination.  Patient has required frequent platelet transfusions and is also been borderline for blood transfusion as well.  Does not report any blood in her stool.  Patient also currently on chemotherapy.   Past Medical History:  Diagnosis Date  . Blood dyscrasia   . Breast cancer ([place])    right, lumpectomy, radiation, chemo  . Breast cancer ([place])    Right, 2007  . Breast cancer ([place]) 06/20/2016   INVASIVE DUCTAL CARCINOMA.  . Collapsed lung 02/14/2017   RIGHT  . Complication of anesthesia    PT STATED AFTER BIL HIP SURGERIES SHE STOPPED BREATHING THE NIGHT AFTER SURGERY  . COPD (chronic obstructive pulmonary disease) ([place])   . GERD (gastroesophageal reflux disease)   . Headache   . History of chemotherapy   . History of methicillin resistant staphylococcus aureus (MRSA) 2011  . History of radiation therapy   . Hyperlipidemia   . Hypertension   . Liver cancer ([place]) 05/2016  . Lung cancer ([place]) 05/2016  . Osteoarthritis    right hip  . Osteoporosis   . Squamous cell carcinoma    leg, Followed by Dr. Nicole Kindred    Patient Active Problem List   Diagnosis Date Noted  . Fever 07/21/2017  . Recurrent productive cough 02/25/2017  . History of  blood transfusion reaction 02/08/2017  . Constipation 12/27/2016  . B12 deficiency 10/09/2016  . MDS (myelodysplastic syndrome) ([place]) 09/24/2016  . Acute blood loss anemia 08/04/2016  . GI bleed 08/03/2016  . Thrombocytopenia ([place]) 08/03/2016  . 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 Persistent headaches 07/02/2016  . Mass of upper inner quadrant of left breast 06/20/2016  . Carcinoma of overlapping sites of right breast in female, estrogen receptor positive ([place]) 06/08/2016  . Fatigue 06/08/2016  . Chronic fatigue 06/08/2016  . Nutritional anemia, unspecified 06/08/2016  . Prediabetes 01/25/2016  . Tobacco abuse 11/06/2012  . History of breast cancer 11/06/2012  . Osteopenia 11/08/2011  . Hyperlipidemia 11/02/2010  . Essential hypertension 12/23/2006  . GERD 12/23/2006    Past Surgical History:  Procedure Laterality Date  . ABDOMINAL HYSTERECTOMY    . BREAST BIOPSY Left 2002   left breast, calcifications  . BREAST BIOPSY Left 06/20/2016   INVASIVE DUCTAL CARCINOMA.  Marland Kitchen BREAST EXCISIONAL BIOPSY Right 2007   positive  . bunion repair    . ESOPHAGOGASTRODUODENOSCOPY (EGD) WITH PROPOFOL N/A 08/05/2016   Procedure: ESOPHAGOGASTRODUODENOSCOPY (EGD) WITH PROPOFOL;  Surgeon: San Jetty, MD;  Location: [facility] ENDOSCOPY;  Service: General;  Laterality: N/A;  . JOINT REPLACEMENT    . left breast biopsy    . NASAL SINUS SURGERY    . PORTACATH PLACEMENT Right 04/13/2017   Procedure: INSERTION PORT-A-CATH- RIGHT INTERNAL JUGULAR;  Surgeon: Robert Bellow, MD;  Location: [facility] ORS;  Service: General;  Laterality: Right;  . right hip replacement    . SHOULDER SURGERY    . SQUAMOUS CELL CARCINOMA EXCISION     right leg, Dr. Nicole Kindred  . TOTAL HIP ARTHROPLASTY     right    Prior to Admission medications   Medication Sig Start Date End Date Taking? Authorizing Provider  acyclovir (ZOVIRAX) 400 MG tablet Take 1 tablet (400 mg total) by mouth 2 (two) times daily. 06/24/17   [name],  MD  ALPRAZolam Duanne Moron) 0.5 MG tablet TAKE 1 TABLET BY MOUTH EVERY 8 HOURS AS NEEDED FOR ANXIETY OR SLEEP 02/28/17   Verlon Au, NP  amoxicillin-clavulanate (AUGMENTIN) 875-125 MG tablet Take 1 tablet by mouth 2 (two) times daily. 07/05/17   Jacquelin Hawking, NP  cyanocobalamin (,VITAMIN B-12,) 1000 MCG/ML injection Inject 1,000 mcg into the muscle every 30 (thirty) days.    [provider]  ergocalciferol (VITAMIN D2) 50000 units capsule Take 1 capsule (50,000 Units total) by mouth every Saturday. In the morning. 05/25/17   [name], MD  fexofenadine (ALLEGRA) 180 MG tablet Take 180 mg by mouth every morning.     [provider]  fluconazole (DIFLUCAN) 200 MG tablet Take 1 tablet (200 mg total) by mouth daily. 06/24/17   [name], MD  Glucosamine-Chondroitin (COSAMIN DS PO) Take 1 tablet by mouth daily. In the morning.    [provider]  letrozole (FEMARA) 2.5 MG tablet Take 1 tablet (2.5 mg total) by mouth daily. Once a day. 06/20/17   [name], MD  lidocaine-prilocaine (EMLA) cream Apply 1 application topically as needed. Apply generously over the Mediport 45 minutes prior to chemotherapy. 04/15/17   [name], MD  magic mouthwash w/lidocaine SOLN Take 5 mLs by mouth 4 (four) times daily. 07/04/17   Jacquelin Hawking, NP  metoprolol succinate (TOPROL-XL) 25 MG 24 hr tablet Take 1 tablet (25 mg total) by mouth daily. 05/02/17   [name], MD  neomycin-polymyxin-pramoxine (NEOSPORIN PLUS) 1 % cream Apply 1 application topically 2 (two) times daily as needed (Nasal passages as needed for dryness.).    [provider]  ondansetron (ZOFRAN) 8 MG tablet Take 1 tablet by mouth every 8 (eight) hours as needed for nausea/vomiting. 07/20/16   [provider]  pravastatin (PRAVACHOL) 40 MG tablet Take 1 tablet (40 mg total) by mouth daily. 05/02/17   [name], MD  Probiotic Product (PROBIOTIC &  ACIDOPHILUS EX ST PO) Take 1 tablet by mouth daily.     [provider]  prochlorperazine (COMPAZINE) 10 MG tablet Take 1 tablet (10 mg total) by mouth every 6 (six) hours as needed for nausea or vomiting. 07/15/17   Verlon Au, NP  ranitidine (ZANTAC) 75 MG tablet Take 75 mg by mouth as needed.     [provider]  vitamin B-12 (CYANOCOBALAMIN) 1000 MCG tablet Take 1,000 mcg by mouth daily.     [provider]    Allergies Codeine; Fish-derived products; Morphine sulfate; Adhesive [tape]; and Oxycodone  Family History  Problem Relation Age of Onset  . Heart disease Father 51  . Heart disease Mother   . Hyperlipidemia Mother   . Heart disease Brother   . Diabetes Brother   . Diabetes Maternal Grandmother   . Breast cancer Cousin   . Kidney disease Brother     Social History Social History   Tobacco Use  . Smoking status: Current Some  Day Smoker    Packs/day: 0.10    Years: 30.00    Pack years: 3.00    Types: Cigarettes  . Smokeless tobacco: Never Used  Substance Use Topics  . Alcohol use: Yes    Alcohol/week: 0.0 oz    Comment: socially  . Drug use: No    Review of Systems  Constitutional: As above Eyes: No visual changes. ENT: No sore throat. Cardiovascular: Denies chest pain. Respiratory: Denies shortness of breath. Gastrointestinal: No abdominal pain.  No nausea, no vomiting.  No diarrhea.  No constipation. Genitourinary: Negative for dysuria. Musculoskeletal: Negative for back pain. Skin: Negative for rash. Neurological: Negative for headaches, focal weakness or numbness.   ____________________________________________   PHYSICAL EXAM:  VITAL SIGNS: ED Triage Vitals  Enc Vitals Group     BP 07/21/17 1502 (!) 139/95     Pulse Rate 07/21/17 1502 (!) 140     Resp 07/21/17 1502 18     Temp 07/21/17 1502 (!) 100.8 F (38.2 C)     Temp Source 07/21/17 1502 Oral     SpO2 07/21/17 1502 96 %     Weight 07/21/17 1503 133 lb  (60.3 kg)     Height 07/21/17 1503 5\' 1"  (1.549 m)     Head Circumference --      Peak Flow --      Pain Score 07/21/17 1503 0     Pain Loc --      Pain Edu? --      Excl. in [place]? --     Constitutional: Alert and oriented. Well appearing and in no acute distress. Eyes: Conjunctivae are normal.  Head: Atraumatic. Nose: No congestion/rhinnorhea. Mouth/Throat: Mucous membranes are moist.  Neck: No stridor.   Cardiovascular: Normal rate, regular rhythm. Grossly normal heart sounds.   Respiratory: Normal respiratory effort.  No retractions. Lungs CTAB. Gastrointestinal: Soft and nontender. No distention. No CVA tenderness. Musculoskeletal: No lower extremity tenderness nor edema.  No joint effusions. Neurologic:  Normal speech and language. No gross focal neurologic deficits are appreciated. Skin:  Skin is warm, dry and intact. No rash noted. Psychiatric: Mood and affect are normal. Speech and behavior are normal.  ____________________________________________   LABS (all labs ordered are listed, but only abnormal results are displayed)  Labs Reviewed  CULTURE, BLOOD (ROUTINE X 2)  CULTURE, BLOOD (ROUTINE X 2)  MRSA PCR SCREENING  URINE CULTURE  COMPREHENSIVE METABOLIC PANEL  LACTIC ACID, PLASMA  LACTIC ACID, PLASMA  CBC WITH DIFFERENTIAL/PLATELET  PROTIME-INR  URINALYSIS, COMPLETE (UACMP) WITH MICROSCOPIC   ____________________________________________  EKG  ED ECG REPORT I, Doran Stabler, the attending physician, personally viewed and interpreted this ECG.   Date: 07/21/2017  EKG Time: 1607  Rate: 125  Rhythm: sinus tachycardia  Axis: Normal  Intervals:Borderline wide-complex QRS.  ST&T Change: Tall, peaked T waves in V3 and V4 which are new from previous.  EKG machine reads as an acute MI.  No reciprocal depression.  Single T wave inversion in V6.  This represents a change from her previous  EKGs.  ____________________________________________  RADIOLOGY  Chest x-ray without any acute disease ____________________________________________   PROCEDURES  Procedure(s) performed:   .Critical Care Performed by: Orbie Pyo, MD Authorized by: Orbie Pyo, MD   Critical care provider statement:    Critical care time (minutes):  35   Critical care was necessary to treat or prevent imminent or life-threatening deterioration of the following conditions:  Sepsis   Critical care was time  spent personally by me on the following activities:  Discussions with consultants, examination of patient, ordering and performing treatments and interventions, ordering and review of radiographic studies, ordering and review of laboratory studies and re-evaluation of patient's condition    Critical Care performed:   ____________________________________________   INITIAL IMPRESSION / [place] / ED COURSE  Pertinent labs & imaging results that were available during my care of the patient were reviewed by me and considered in my medical decision making (see chart for details).  DDX: Neutropenic fever, sepsis, bacteremia, pneumonia, UTI As part of my medical decision making, I reviewed the following data within the [place] outpatient visits.  ----------------------------------------- 4:04 PM on 07/21/2017 -----------------------------------------  I discussed case Dr. Rogue Bussing who agrees with admission as well as broad-spectrum antibiotic coverage.  We discussed that if the patient is to require a platelet transfer that she will need single donor platelets from [place].  Dr. Lynett Fish says that the regular PRBCs will be sufficient for blood transfusion.  Patient aware of diagnosis and need for admission to the hospital.  Signed out to Dr. Genia Harold.  ----------------------------------------- 4:12 PM on  07/21/2017 -----------------------------------------  Patient's EKG reads acute MI.  It does represent a change from previous EKGs.  However, the patient denies any chest pain.  She is in no distress.  Is not diaphoretic.  She has been fully awake and alert and interactive with a pleasant demeanor.  I do not believe the patient is having an acute MI.  Patient troponin negative. ____________________________________________   FINAL CLINICAL IMPRESSION(S) / ED DIAGNOSES  Neutropenic fever.  Sepsis.    NEW MEDICATIONS STARTED DURING THIS VISIT:  New Prescriptions   No medications on file     Note:  This document was prepared using Dragon voice recognition software and may include unintentional dictation errors.     Orbie Pyo, MD 07/21/17 1605    Clearnce Hasten, Randall An, MD 07/21/17 [phone]

## 2017-07-21 NOTE — ED Notes (Signed)
Lab called to check on troponin result. Order placed 1611, unable to see any results in Epic. Per lab, troponin level is <0.03.

## 2017-07-21 NOTE — ED Triage Notes (Signed)
Patient presents to the ED with fever >100.5 getting a new chemo starting in April.  Patient states, "ever since then my neutrophils have been zero."  Patient just recently started on a immune boosting medication this week.  Patient states, "I have to get platelets and blood pretty much every week."

## 2017-07-21 NOTE — Progress Notes (Signed)
Family Meeting Note  Advance Directive:yes  Today a meeting took place with the Patient.and family The following clinical team members were present during this meeting:MD  The following were discussed:Patient's diagnosis: Neutropenic fever with metastatic breast cancer and MDS, Patient's progosis: Unable to determine and Goals for treatment: Full Code  Additional follow-up to be provided: Patient is full code.  Advanced directives in place and no need to change advanced directives as per patient.  Time spent during discussion: 16 minutes  Cowan Pilar, MD

## 2017-07-21 NOTE — Progress Notes (Signed)
CODE SEPSIS - PHARMACY COMMUNICATION  **Broad Spectrum Antibiotics should be administered within 1 hour of Sepsis diagnosis**  Time Code Sepsis Called/Page Received: 1536  Antibiotics Ordered: Zosyn and Vancomycin  Time of 1st antibiotic administration: 1556  Additional action taken by pharmacy: N/A  If necessary, Name of Provider/Nurse Contacted: [place] ,PharmD Pharmacy Resident 07/21/2017  4:17 PM

## 2017-07-21 NOTE — Progress Notes (Signed)
Pharmacy Antibiotic Note  Shelly Arias is a 72 y.o. female admitted on 07/21/2017 with sepsis.  Pharmacy has been consulted for vancomycin and Zosyn dosing.  Plan: Zosyn 3.375g IV q8h (Extended Infusion)   Vancomycin 750mg  IV q24h with a 14 hour stack dose (second dose given 14 hours after first dose). Vancomycin trough estimate 15.3 with a goal of 15-20. Will check first vancomycin trough before 4th dose.   T1/2 16.1, Vd 33.5, Ke 0.043  Height: 5\' 1"  (154.9 cm) Weight: 133 lb (60.3 kg) IBW/kg (Calculated) : 47.8  Temp (24hrs), Avg:100.8 F (38.2 C), Min:100.8 F (38.2 C), Max:100.8 F (38.2 C)  Recent Labs  Lab 07/15/17 1139 07/15/17 1221 07/18/17 1420 07/21/17 1532  WBC 0.7*  --  0.7*  --   CREATININE  --  0.95  --   --   LATICACIDVEN  --  1.5  --  1.6    Estimated Creatinine Clearance: 46.6 mL/min (by C-G formula based on SCr of 0.95 mg/dL).    Allergies  Allergen Reactions  . Codeine Anaphylaxis  . Fish-Derived Products Anaphylaxis  . Morphine Sulfate Anaphylaxis    REACTION: anaphylaxis  . Adhesive [Tape]     PAPER TAPE OK TO USE  . Oxycodone     Nausea and vomiting    Antimicrobials this admission: Zosyn 5/12 >>  Vancomycin 5/12 >>  Dose adjustments this admission:   Microbiology results: 5/12 BCx: sent 5/12 UCx: sent 5/12 MRSA PCR: sent  Thank you for allowing pharmacy to be a part of this patient's care.  Candelaria Stagers, PharmD Pharmacy Resident  07/21/2017 4:24 PM

## 2017-07-22 ENCOUNTER — Inpatient Hospital Stay: Payer: Medicare Other

## 2017-07-22 ENCOUNTER — Inpatient Hospital Stay: Payer: Medicare Other | Admitting: Internal Medicine

## 2017-07-22 DIAGNOSIS — Z803 Family history of malignant neoplasm of breast: Secondary | ICD-10-CM

## 2017-07-22 DIAGNOSIS — Z170 Estrogen receptor positive status [ER+]: Secondary | ICD-10-CM

## 2017-07-22 DIAGNOSIS — Z79811 Long term (current) use of aromatase inhibitors: Secondary | ICD-10-CM

## 2017-07-22 DIAGNOSIS — K219 Gastro-esophageal reflux disease without esophagitis: Secondary | ICD-10-CM

## 2017-07-22 DIAGNOSIS — D709 Neutropenia, unspecified: Secondary | ICD-10-CM

## 2017-07-22 DIAGNOSIS — D696 Thrombocytopenia, unspecified: Secondary | ICD-10-CM

## 2017-07-22 DIAGNOSIS — D469 Myelodysplastic syndrome, unspecified: Secondary | ICD-10-CM

## 2017-07-22 DIAGNOSIS — Z9221 Personal history of antineoplastic chemotherapy: Secondary | ICD-10-CM

## 2017-07-22 DIAGNOSIS — M1990 Unspecified osteoarthritis, unspecified site: Secondary | ICD-10-CM

## 2017-07-22 DIAGNOSIS — M810 Age-related osteoporosis without current pathological fracture: Secondary | ICD-10-CM

## 2017-07-22 DIAGNOSIS — F17210 Nicotine dependence, cigarettes, uncomplicated: Secondary | ICD-10-CM

## 2017-07-22 DIAGNOSIS — D649 Anemia, unspecified: Secondary | ICD-10-CM

## 2017-07-22 DIAGNOSIS — Z923 Personal history of irradiation: Secondary | ICD-10-CM

## 2017-07-22 DIAGNOSIS — J449 Chronic obstructive pulmonary disease, unspecified: Secondary | ICD-10-CM

## 2017-07-22 DIAGNOSIS — I10 Essential (primary) hypertension: Secondary | ICD-10-CM

## 2017-07-22 DIAGNOSIS — R509 Fever, unspecified: Secondary | ICD-10-CM

## 2017-07-22 DIAGNOSIS — Z8614 Personal history of Methicillin resistant Staphylococcus aureus infection: Secondary | ICD-10-CM

## 2017-07-22 DIAGNOSIS — C50811 Malignant neoplasm of overlapping sites of right female breast: Secondary | ICD-10-CM

## 2017-07-22 DIAGNOSIS — R630 Anorexia: Secondary | ICD-10-CM

## 2017-07-22 LAB — CBC
HCT: 17.7 % — ABNORMAL LOW (ref 35.0–47.0)
Hemoglobin: 6.2 g/dL — ABNORMAL LOW (ref 12.0–16.0)
MCH: 29.6 pg (ref 26.0–34.0)
MCHC: 35 g/dL (ref 32.0–36.0)
MCV: 84.6 fL (ref 80.0–100.0)
PLATELETS: 7 10*3/uL — AB (ref 150–440)
RBC: 2.09 MIL/uL — ABNORMAL LOW (ref 3.80–5.20)
RDW: 14.1 % (ref 11.5–14.5)
WBC: 0.8 10*3/uL — AB (ref 3.6–11.0)

## 2017-07-22 LAB — BASIC METABOLIC PANEL
Anion gap: 7 (ref 5–15)
BUN: 12 mg/dL (ref 6–20)
CO2: 24 mmol/L (ref 22–32)
CREATININE: 0.74 mg/dL (ref 0.44–1.00)
Calcium: 7.9 mg/dL — ABNORMAL LOW (ref 8.9–10.3)
Chloride: 100 mmol/L — ABNORMAL LOW (ref 101–111)
GFR calc Af Amer: >60 mL/min (ref >60)
GFR calc non Af Amer: >60 mL/min (ref >60)
Glucose, Bld: 111 mg/dL — ABNORMAL HIGH (ref 65–99)
Potassium: 3.9 mmol/L (ref 3.5–5.1)
SODIUM: 131 mmol/L — AB (ref 135–145)

## 2017-07-22 LAB — MAGNESIUM: Magnesium: 1.5 mg/dL — ABNORMAL LOW (ref 1.7–2.4)

## 2017-07-22 LAB — PREPARE RBC (CROSSMATCH)

## 2017-07-22 MED ORDER — SODIUM CHLORIDE 0.9 % IV SOLN
Freq: Once | INTRAVENOUS | Status: AC
  Administered 2017-07-22: 15:00:00 via INTRAVENOUS

## 2017-07-22 MED ORDER — SODIUM CHLORIDE 0.9 % IV SOLN
Freq: Once | INTRAVENOUS | Status: AC
  Administered 2017-07-22: 20:00:00 via INTRAVENOUS

## 2017-07-22 MED ORDER — DIPHENHYDRAMINE HCL 50 MG/ML IJ SOLN
25.0000 mg | Freq: Once | INTRAMUSCULAR | Status: AC
  Administered 2017-07-22: 25 mg via INTRAVENOUS
  Filled 2017-07-22: qty 0.5

## 2017-07-22 MED ORDER — MAGNESIUM SULFATE IN D5W 1-5 GM/100ML-% IV SOLN
1.0000 g | Freq: Once | INTRAVENOUS | Status: AC
  Administered 2017-07-22: 14:00:00 1 g via INTRAVENOUS
  Filled 2017-07-22: qty 100

## 2017-07-22 MED ORDER — SODIUM CHLORIDE 0.9 % IV SOLN: Freq: Once | INTRAVENOUS | Status: DC

## 2017-07-22 NOTE — Plan of Care (Signed)
Telemetry called - pt had 7 beat run of V Tach.  Checked on patient and she was resting, didn't report feeling any different.  Called prime doc - she sd that infection can cause this when I told her she was here for neutropenic fever.  She wanted to make sure that K+ and Mg are checked in the am.

## 2017-07-22 NOTE — Progress Notes (Signed)
[facility] [place] NAME: Shelly Arias    MR#:  [id number]  DATE OF BIRTH:  06/18/1945  SUBJECTIVE: Admitted for neutropenic fever.  Patient blood cultures, urine cultures have been negative, no further fever, unprotected antibiotics.  Found to have severe anemia, thrombocytopenia.  CHIEF COMPLAINT:   Chief Complaint  Patient presents with  . Cancer  . Fever    REVIEW OF SYSTEMS:    Review of Systems  Constitutional: Positive for malaise/fatigue and weight loss. Negative for chills and fever.  HENT: Negative for hearing loss.   Eyes: Negative for blurred vision, double vision and photophobia.  Respiratory: Positive for cough. Negative for hemoptysis and shortness of breath.   Cardiovascular: Negative for palpitations, orthopnea and leg swelling.  Gastrointestinal: Negative for abdominal pain, diarrhea and vomiting.  Genitourinary: Negative for dysuria and urgency.  Musculoskeletal: Negative for myalgias and neck pain.  Skin: Negative for rash.  Neurological: Negative for dizziness, focal weakness, seizures, weakness and headaches.  Psychiatric/Behavioral: Negative for memory loss. The patient does not have insomnia.     Nutrition:  poor appetite and does not want to eat Tolerating Diet: Tolerating PT:      DRUG ALLERGIES:   Allergies  Allergen Reactions  . Codeine Anaphylaxis  . Fish-Derived Products Anaphylaxis  . Morphine Sulfate Anaphylaxis    REACTION: anaphylaxis  . Adhesive [Tape]     PAPER TAPE OK TO USE  . Oxycodone     Nausea and vomiting    VITALS:  Blood pressure (!) 128/105, pulse (!) 105, temperature 98.1 F (36.7 C), temperature source Oral, resp. rate 14, height 5\' 1"  (1.549 m), weight 60.3 kg (133 lb), SpO2 98 %.  PHYSICAL EXAMINATION:   Physical Exam  GENERAL:  72 y.o.-year-old patient lying in the bed with no acute distress.  Appears cachectic. EYES: Pupils equal, round, pale conjunctiva.   HEENT: Head atraumatic, normocephalic. Oropharynx and nasopharynx clear.  NECK:  Supple, no jugular venous distention. No thyroid enlargement, no tenderness.  LUNGS: Normal breath sounds bilaterally, no wheezing, rales,rhonchi or crepitation. No use of accessory muscles of respiration.  CARDIOVASCULAR: S1, S2 normal. No murmurs, rubs, or gallops.  ABDOMEN: Soft, nontender, nondistended. Bowel sounds present. No organomegaly or mass.  EXTREMITIES: No pedal edema, cyanosis, or clubbing.  NEUROLOGIC: Cranial nerves II through XII are intact. Muscle strength 5/5 in all extremities. Sensation intact. Gait not checked.  PSYCHIATRIC: The patient is alert and oriented x 3.  SKIN: No obvious rash, lesion, or ulcer.    LABORATORY PANEL:   CBC Recent Labs  Lab 07/22/17 0406  WBC 0.8*  HGB 6.2*  HCT 17.7*  PLT 7*   ------------------------------------------------------------------------------------------------------------------  Chemistries  Recent Labs  Lab 07/21/17 1532 07/22/17 0406  NA 133* 131*  K 4.1 3.9  CL 98* 100*  CO2 26 24  GLUCOSE 124* 111*  BUN 19 12  CREATININE 0.70 0.74  CALCIUM 8.7* 7.9*  MG  --  1.5*  AST 18  --   ALT 21  --   ALKPHOS 57  --   BILITOT 0.5  --    ------------------------------------------------------------------------------------------------------------------  Cardiac Enzymes Recent Labs  Lab 07/21/17 1558  TROPONINI <0.03   ------------------------------------------------------------------------------------------------------------------  RADIOLOGY:  Dg Chest 2 View  Result Date: 07/21/2017 CLINICAL DATA:  Fever today. EXAM: CHEST - 2 VIEW COMPARISON:  PET CT scan 06/03/2017. PA and lateral chest 03/15/2017. Single-view of the chest 04/13/2017. FINDINGS: Right Port-A-Cath is unchanged since the most  recent exam. The patient is status post right axillary dissection. Lungs are clear. Heart size is normal. No pneumothorax or pleural fluid.  Aortic atherosclerosis is identified. Convex left lumbar scoliosis and multilevel thoracic and lumbar degenerative disease are noted. IMPRESSION: No acute disease. Electronically Signed   By: Inge Rise M.D.   On: 07/21/2017 15:49     ASSESSMENT AND PLAN:   Active Problems:   Fever   Neutropenic fever: Continue broad-spectrum antibiotics with vancomycin, cefepime.  So far pattern urine cultures have been negative. 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  #3 history of nonsustained V. tach: Asymptomatic.   keep potassium more than 4, magnesium  1.5 so replace magnesium 1 g of IV magnesium ordered.  Monitor on telemetry.  #4 history of COPD 5.  GERD 6.  History of liver cancer, metastatic lung cancer now Patient high risk for cardiac arrest, CODE STATUS full code.   All the records are reviewed and case discussed with Care Management/Social Workerr. Management plans discussed with the patient, family and they are in agreement.  CODE STATUS: full  TOTAL TIME TAKING CARE OF THIS PATIENT:  55 minutes.   POSSIBLE D/C IN  1-2DAYS, DEPENDING ON CLINICAL CONDITION.   Epifanio Lesches M.D on 07/22/2017 at 2:11 PM  Between 7am to 6pm - Pager - [phone]  After 6pm go to [email or web address] - password [email or web address] [facility]  [facility]  Office  [phone]  CC: Primary care physician; [name], MD

## 2017-07-22 NOTE — Progress Notes (Signed)
   07/22/17 1545  Clinical Encounter Type  Visited With Patient  Visit Type Initial  Referral From Nurse  Consult/Referral To Chaplain  Spiritual Encounters  Spiritual Needs Prayer;Emotional   [facility] visited with PT while rounding on 1C. [place] knows PT from the [facility]. CH offered encouragement and prayer. CH will follow up.

## 2017-07-22 NOTE — Consult Note (Signed)
[facility] CONSULT NOTE  Patient Care Team: [name], MD as PCP - General (Family Medicine) [name], DPM as Consulting Physician (Podiatry) [name], MD as Consulting Physician (Internal Medicine) Bary Castilla, Forest Gleason, MD (General Surgery)  CHIEF COMPLAINTS/PURPOSE OF CONSULTATION:  Neutropenic fever  HISTORY OF PRESENTING ILLNESS:  Shelly Arias 72 y.o.  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  Patient has prolonged severe thrombocytopenia/anemia/severe neutropenia-secondary to underlying MDS/ worsened by Dacogen.  Patient's absolute neutrophil count is 0/PRBC transfusion once every 1 to 2 weeks; and also platelet transfusion almost twice a week.  Patient needs special platelets-crossmatched washed.   Patient noted to have a fever on the day of admission.  She was having Reiger's.  Denies any cough.  Denies any diarrhea.  Denies abdominal pain.  Poor appetite.  No oral ulceration.  She feels weak overall.  Denies any gum bleeding or mucosal bleeding.  ROS: A complete 10 point review of system is done which is negative except mentioned above in history of present illness  MEDICAL HISTORY:  Past Medical History:  Diagnosis Date  . Blood dyscrasia   . Breast cancer ([place])    right, lumpectomy, radiation, chemo  . Breast cancer ([place])    Right, 2007  . 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  . Collapsed lung 02/14/2017   RIGHT  . Complication of anesthesia    PT STATED AFTER BIL HIP SURGERIES SHE STOPPED BREATHING THE NIGHT AFTER SURGERY  . COPD (chronic obstructive pulmonary disease) ([place])   . GERD (gastroesophageal reflux disease)   . Headache   . History of chemotherapy   . History of methicillin resistant staphylococcus aureus (MRSA) 2011  . History of radiation therapy   .  Hyperlipidemia   . Hypertension   . Liver cancer ([place]) 05/2016  . Lung cancer ([place]) 05/2016  . Osteoarthritis    right hip  . Osteoporosis   . Squamous cell carcinoma    leg, Followed by Dr. Nicole Kindred    SURGICAL HISTORY: Past Surgical History:  Procedure Laterality Date  . ABDOMINAL HYSTERECTOMY    . BREAST BIOPSY Left 2002   left breast, calcifications  . BREAST BIOPSY Left 06/20/2016   INVASIVE DUCTAL CARCINOMA.  Marland Kitchen BREAST EXCISIONAL BIOPSY Right 2007   positive  . bunion repair    . ESOPHAGOGASTRODUODENOSCOPY (EGD) WITH PROPOFOL N/A 08/05/2016   Procedure: ESOPHAGOGASTRODUODENOSCOPY (EGD) WITH PROPOFOL;  Surgeon: San Jetty, MD;  Location: [facility] ENDOSCOPY;  Service: General;  Laterality: N/A;  . JOINT REPLACEMENT    . left breast biopsy    . NASAL SINUS SURGERY    . PORTACATH PLACEMENT Right 04/13/2017   Procedure: INSERTION PORT-A-CATH- RIGHT INTERNAL JUGULAR;  Surgeon: Robert Bellow, MD;  Location: [facility] ORS;  Service: General;  Laterality: Right;  . right hip replacement    . SHOULDER SURGERY    . SQUAMOUS CELL CARCINOMA EXCISION     right leg, Dr. Nicole Kindred  . TOTAL HIP ARTHROPLASTY     right    SOCIAL HISTORY: Social History   Socioeconomic History  . Marital status: Married    Spouse name: Not on file  . Number of children: Not on file  . Years of education: Not on file  . Highest education level: Not on file  Occupational History  . Not on file  Social Needs  . Financial  resource strain: Not hard at all  . Food insecurity:    Worry: Never true    Inability: Never true  . Transportation needs:    Medical: No    Non-medical: No  Tobacco Use  . Smoking status: Current Some Day Smoker    Packs/day: 0.10    Years: 30.00    Pack years: 3.00    Types: Cigarettes  . Smokeless tobacco: Never Used  Substance and Sexual Activity  . Alcohol use: Yes    Alcohol/week: 0.0 oz    Comment: socially  . Drug use: No  . Sexual activity: Never  Lifestyle  .  Physical activity:    Days per week: 4 days    Minutes per session: 30 min  . Stress: Not at all  Relationships  . Social connections:    Talks on phone: Three times a week    Gets together: Twice a week    Attends religious service: More than 4 times per year    Active member of club or organization: No    Attends meetings of clubs or organizations: Never    Relationship status: Married  . Intimate partner violence:    Fear of current or ex partner: No    Emotionally abused: No    Physically abused: No    Forced sexual activity: No  Other Topics Concern  . Not on file  Social History Narrative  . Not on file    FAMILY HISTORY: Family History  Problem Relation Age of Onset  . Heart disease Father 52  . Heart disease Mother   . Hyperlipidemia Mother   . Heart disease Brother   . Diabetes Brother   . Diabetes Maternal Grandmother   . Breast cancer Cousin   . Kidney disease Brother     ALLERGIES:  is allergic to codeine; fish-derived products; morphine sulfate; adhesive [tape]; and oxycodone.  MEDICATIONS:  Current Facility-Administered Medications  Medication Dose Route Frequency Provider Last Rate Last Dose  . 0.9 %  sodium chloride infusion   Intravenous Continuous Bettey Costa, MD 75 mL/hr at 07/22/17 0546    . 0.9 %  sodium chloride infusion   Intravenous Once Sridharan, Prasanna, MD      . 0.9 %  sodium chloride infusion   Intravenous Once Charlaine Dalton R, MD      . acetaminophen (TYLENOL) tablet 650 mg  650 mg Oral Q6H PRN Bettey Costa, MD   650 mg at 07/22/17 1743   Or  . acetaminophen (TYLENOL) suppository 650 mg  650 mg Rectal Q6H PRN Bettey Costa, MD      . ALPRAZolam Duanne Moron) tablet 0.5 mg  0.5 mg Oral QHS PRN Bettey Costa, MD   0.5 mg at 07/21/17 2258  . bisacodyl (DULCOLAX) EC tablet 5 mg  5 mg Oral Daily PRN Bettey Costa, MD      . famotidine (PEPCID) tablet 20 mg  20 mg Oral Daily Mody, Sital, MD   20 mg at 07/22/17 1017  . ibuprofen (ADVIL,MOTRIN)  tablet 400 mg  400 mg Oral Q6H PRN Bettey Costa, MD      . letrozole [facility]) tablet 2.5 mg  2.5 mg Oral Daily Mody, Sital, MD   2.5 mg at 07/22/17 1017  . loratadine (CLARITIN) tablet 10 mg  10 mg Oral Daily Bettey Costa, MD   10 mg at 07/22/17 1017  . metoprolol succinate (TOPROL-XL) 24 hr tablet 25 mg  25 mg Oral Daily Bettey Costa, MD   25 mg  at 07/22/17 1017  . ondansetron (ZOFRAN) tablet 4 mg  4 mg Oral Q6H PRN Bettey Costa, MD       Or  . ondansetron (ZOFRAN) injection 4 mg  4 mg Intravenous Q6H PRN Mody, Sital, MD      . piperacillin-tazobactam (ZOSYN) IVPB 3.375 g  3.375 g Intravenous Q8H Candelaria Stagers, [facility]   Stopped at 07/22/17 1830  . polyethylene glycol (MIRALAX / GLYCOLAX) packet 17 g  17 g Oral Daily PRN Mody, Sital, MD      . pravastatin (PRAVACHOL) tablet 40 mg  40 mg Oral q1800 Bettey Costa, MD   40 mg at 07/22/17 1742  . Tbo-Filgrastim [facility]) injection 480 mcg  480 mcg Subcutaneous Daily Charlaine Dalton R, MD   480 mcg at 07/22/17 1017  . vancomycin (VANCOCIN) IVPB 750 mg/150 ml premix  750 mg Intravenous Q24H Candelaria Stagers, RPH 150 mL/hr at 07/22/17 0538 750 mg at 07/22/17 9562   Facility-Administered Medications Ordered in Other Encounters  Medication Dose Route Frequency Provider Last Rate Last Dose  . 0.9 %  sodium chloride infusion   Intravenous Continuous [name], MD   Stopped at 06/28/17 1450  . heparin lock flush 100 unit/mL  500 Units Intravenous Once Charlaine Dalton R, MD      . sodium chloride flush (NS) 0.9 % injection 10 mL  10 mL Intravenous PRN Charlaine Dalton R, MD      . sodium chloride flush (NS) 0.9 % injection 10 mL  10 mL Intravenous PRN Verlon Au, NP          .  PHYSICAL EXAMINATION:  Vitals:   07/22/17 1937 07/22/17 1958  BP: (!) 132/56 138/61  Pulse: (!) 101 92  Resp: 16 15  Temp: 98.9 F (37.2 C) 98.4 F (36.9 C)  SpO2: 96%    Filed Weights   07/21/17 1503  Weight: 133 lb (60.3 kg)    GENERAL:  Well-nourished well-developed; Alert, no distress and comfortable.   Alone. EYES: Positive for pallor no icterus. OROPHARYNX: no thrush or ulceration. NECK: supple, no masses felt LYMPH:  no palpable lymphadenopathy in the cervical, axillary or inguinal regions LUNGS: decreased breath sounds to auscultation at bases and  No wheeze or crackles HEART/CVS: regular rate & rhythm and no murmurs; No lower extremity edema ABDOMEN: abdomen soft, non-tender and normal bowel sounds Musculoskeletal:no cyanosis of digits and no clubbing  PSYCH: alert & oriented x 3 with fluent speech NEURO: no focal motor/sensory deficits SKIN:  no rashes or significant lesions  LABORATORY DATA:  I have reviewed the data as listed Lab Results  Component Value Date   WBC 0.8 (LL) 07/22/2017   HGB 6.2 (L) 07/22/2017   HCT 17.7 (L) 07/22/2017   MCV 84.6 07/22/2017   PLT 7 (LL) 07/22/2017   Recent Labs    06/10/17 1028 07/15/17 1221 07/21/17 1532 07/22/17 0406  NA 135 129* 133* 131*  K 4.3 4.1 4.1 3.9  CL 102 95* 98* 100*  CO2 26 22 26 24   GLUCOSE 116* 122* 124* 111*  BUN 14 17 19 12   CREATININE 0.70 0.95 0.70 0.74  CALCIUM 9.1 8.7* 8.7* 7.9*  GFRNONAA >60 60* >60 >60  GFRAA >60 >60 >60 >60  PROT 6.2* 6.4* 6.5  --   ALBUMIN 3.5 2.7* 2.8*  --   AST 18 33 18  --   ALT 17 28 21   --   ALKPHOS 71 62 57  --   BILITOT 0.7 0.7  0.5  --     RADIOGRAPHIC STUDIES: I have personally reviewed the radiological images as listed and agreed with the findings in the report. Dg Chest 2 View  Result Date: 07/21/2017 CLINICAL DATA:  Fever today. EXAM: CHEST - 2 VIEW COMPARISON:  PET CT scan 06/03/2017. PA and lateral chest 03/15/2017. Single-view of the chest 04/13/2017. FINDINGS: Right Port-A-Cath is unchanged since the most recent exam. The patient is status post right axillary dissection. Lungs are clear. Heart size is normal. No pneumothorax or pleural fluid. Aortic atherosclerosis is identified. Convex left lumbar  scoliosis and multilevel thoracic and lumbar degenerative disease are noted. IMPRESSION: No acute disease. Electronically Signed   By: Inge Rise M.D.   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  Continue broad-spectrum antibiotics.  Continue Granix  #Severe anemia/thrombocytopenia-hemoglobin 6.8; proceed with 2 units of PRBC transfusion.  Platelet count 6-no active bleeding; recommend platelet transfusion 1 unit [crossmatched/washed].  Spoke to blood bank; lakelets likely be available on May 14th.   # Likely MDS-status post Dacogen cycle #1 approximately 6 weeks ago; plan to hold further therapy given severe neutropenia's/neutropenic fever.  #Metastatic breast cancer-currently stable; continue letrozole.  Thank you Dr.Konidena for allowing me to participate in the care of your pleasant patient. Please do not hesitate to contact me with questions or concerns in the interim. Discussed with Dr.Konidena.  Overall prognosis is guarded.  All questions were answered. The patient knows to call the clinic with any problems, questions or concerns.   [name], MD 07/22/2017 9:18 PM

## 2017-07-22 NOTE — Plan of Care (Signed)
Labs this morning show drop in WBC from 1.0 to 0.8, platelets from 12 to 7 and Hgb from 7.9 to 6.2.  Called prime doc and said that drop in all 3 was largely due to dilution but said he was going to put in for 1u PRBC.  Also told him that Oncology consult is pending.

## 2017-07-23 ENCOUNTER — Encounter: Payer: Self-pay | Admitting: Nurse Practitioner

## 2017-07-23 ENCOUNTER — Inpatient Hospital Stay: Payer: Medicare Other

## 2017-07-23 LAB — TYPE AND SCREEN
ABO/RH(D): O POS
ANTIBODY SCREEN: POSITIVE
Donor AG Type: NEGATIVE
Donor AG Type: NEGATIVE
Unit division: 0
Unit division: 0

## 2017-07-23 LAB — MAGNESIUM: MAGNESIUM: 1.6 mg/dL — AB (ref 1.7–2.4)

## 2017-07-23 LAB — BPAM RBC
Blood Product Expiration Date: 201905292359
Blood Product Expiration Date: 201906012359
ISSUE DATE / TIME: 201905131931
ISSUE DATE / TIME: 201905131442
Unit Type and Rh: 5100
Unit Type and Rh: 5100

## 2017-07-23 LAB — URINE CULTURE: CULTURE: NO GROWTH

## 2017-07-23 LAB — HEMOGLOBIN AND HEMATOCRIT, BLOOD
HEMATOCRIT: 29.3 % — AB (ref 35.0–47.0)
Hemoglobin: 10.2 g/dL — ABNORMAL LOW (ref 12.0–16.0)

## 2017-07-23 MED ORDER — METHYLPREDNISOLONE SODIUM SUCC 40 MG IJ SOLR
40.0000 mg | Freq: Once | INTRAMUSCULAR | Status: AC
  Administered 2017-07-23: 40 mg via INTRAVENOUS
  Filled 2017-07-23: qty 1

## 2017-07-23 MED ORDER — DIPHENHYDRAMINE HCL 25 MG PO CAPS
25.0000 mg | ORAL_CAPSULE | Freq: Once | ORAL | Status: AC
  Administered 2017-07-23: 25 mg via ORAL
  Filled 2017-07-23: qty 1

## 2017-07-23 MED ORDER — SODIUM CHLORIDE 0.9 % IV SOLN
Freq: Once | INTRAVENOUS | Status: AC
  Administered 2017-07-23: 14:00:00 via INTRAVENOUS

## 2017-07-23 MED ORDER — ACETAMINOPHEN 325 MG PO TABS
650.0000 mg | ORAL_TABLET | Freq: Once | ORAL | Status: AC
  Administered 2017-07-23: 650 mg via ORAL
  Filled 2017-07-23: qty 2

## 2017-07-23 NOTE — Progress Notes (Signed)
[facility] [place] NAME: Shelly Arias    MR#:  [id number]  DATE OF BIRTH:  Dec 13, 1945  Patient still spiking, fever 100.1 this morning.  Patient denies any complaints but noted to have pedal edema.  No cough.  Cultures have been negative.  Getting platelets now.  Poor appetite, poor p.o. intake.  CHIEF COMPLAINT:   Chief Complaint  Patient presents with  . Cancer  . Fever    REVIEW OF SYSTEMS:    Review of Systems  Constitutional: Positive for malaise/fatigue and weight loss. Negative for chills and fever.  HENT: Negative for hearing loss.   Eyes: Negative for blurred vision, double vision and photophobia.  Respiratory: Negative for cough, hemoptysis and shortness of breath.   Cardiovascular: Negative for palpitations, orthopnea and leg swelling.  Gastrointestinal: Negative for abdominal pain, diarrhea and vomiting.  Genitourinary: Negative for dysuria and urgency.  Musculoskeletal: Negative for myalgias and neck pain.  Skin: Negative for rash.  Neurological: Negative for dizziness, focal weakness, seizures, weakness and headaches.  Psychiatric/Behavioral: Negative for memory loss. The patient does not have insomnia.     Nutrition:  poor appetite and does not want to eat Tolerating Diet: Tolerating PT:      DRUG ALLERGIES:   Allergies  Allergen Reactions  . Codeine Anaphylaxis  . Fish-Derived Products Anaphylaxis  . Morphine Sulfate Anaphylaxis    REACTION: anaphylaxis  . Adhesive [Tape]     PAPER TAPE OK TO USE  . Oxycodone     Nausea and vomiting    VITALS:  Blood pressure 127/62, pulse 95, temperature 98.2 F (36.8 C), temperature source Oral, resp. rate 18, height 5\' 1"  (1.549 m), weight 60.3 kg (133 lb), SpO2 98 %.  PHYSICAL EXAMINATION:   Physical Exam  GENERAL:  72 y.o.-year-old patient lying in the bed with no acute distress.  Appears cachectic. EYES: Pupils equal, round, pale conjunctiva.  HEENT:  Head atraumatic, normocephalic. Oropharynx and nasopharynx clear.  NECK:  Supple, no jugular venous distention. No thyroid enlargement, no tenderness.  LUNGS: Normal breath sounds bilaterally, no wheezing, rales,rhonchi or crepitation. No use of accessory muscles of respiration.  CARDIOVASCULAR: S1, S2 normal. No murmurs, rubs, or gallops.  ABDOMEN: Soft, nontender, nondistended. Bowel sounds present. No organomegaly or mass.  EXTREMITIES: .  Slight 1+ pedal edema.  NEUROLOGIC: Cranial nerves II through XII are intact. Muscle strength 5/5 in all extremities. Sensation intact. Gait not checked.  PSYCHIATRIC: The patient is alert and oriented x 3.  SKIN: No obvious rash, lesion, or ulcer.    LABORATORY PANEL:   CBC Recent Labs  Lab 07/22/17 0406 07/23/17 0244  WBC 0.8*  --   HGB 6.2* 10.2*  HCT 17.7* 29.3*  PLT 7*  --    ------------------------------------------------------------------------------------------------------------------  Chemistries  Recent Labs  Lab 07/21/17 1532  07/22/17 0406 07/23/17 0244  NA 133*  --  131*  --   K 4.1  --  3.9  --   CL 98*  --  100*  --   CO2 26  --  24  --   GLUCOSE 124*  --  111*  --   BUN 19  --  12  --   CREATININE 0.70  --  0.74  --   CALCIUM 8.7*  --  7.9*  --   MG  --    < > 1.5* 1.6*  AST 18  --   --   --   ALT 21  --   --   --  ALKPHOS 57  --   --   --   BILITOT 0.5  --   --   --    < > = values in this interval not displayed.   ------------------------------------------------------------------------------------------------------------------  Cardiac Enzymes Recent Labs  Lab 07/21/17 1558  TROPONINI <0.03   ------------------------------------------------------------------------------------------------------------------  RADIOLOGY:  Dg Chest 2 View  Result Date: 07/21/2017 CLINICAL DATA:  Fever today. EXAM: CHEST - 2 VIEW COMPARISON:  PET CT scan 06/03/2017. PA and lateral chest 03/15/2017. Single-view of the  chest 04/13/2017. FINDINGS: Right Port-A-Cath is unchanged since the most recent exam. The patient is status post right axillary dissection. Lungs are clear. Heart size is normal. No pneumothorax or pleural fluid. Aortic atherosclerosis is identified. Convex left lumbar scoliosis and multilevel thoracic and lumbar degenerative disease are noted. IMPRESSION: No acute disease. Electronically Signed   By: Inge Rise M.D.   On: 07/21/2017 15:49     ASSESSMENT AND PLAN:   Active Problems:   Fever   Neutropenic fever: Continue broad-spectrum antibiotics with vancomycin, cefepime.  So far pattern urine cultures have been negative.  Continue vancomycin, cefepime till tomorrow and discharge home with Augmentin if okay with oncology.  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  #3 history of nonsustained V. tach: Return to telemetry, continue to replace potassium, magnesium.  #4 history of COPD 5.  GERD 6.  History of liver cancer, metastatic lung cancer now Patient high risk for cardiac arrest, CODE STATUS full code. Likely discharge tomorrow home.  All the records are reviewed and case discussed with Care Management/Social Workerr. Management plans discussed with the patient, family and they are in agreement.  CODE STATUS: full  TOTAL TIME TAKING CARE OF THIS PATIENT:  55 minutes.   POSSIBLE D/C IN  1-2DAYS, DEPENDING ON CLINICAL CONDITION.   Epifanio Lesches M.D on 07/23/2017 at 2:27 PM  Between 7am to 6pm - Pager - [phone]  After 6pm go to [email or web address] - password [email or web address] [facility]  [facility]  Office  [phone]  CC: Primary care physician; [name], MD

## 2017-07-23 NOTE — Progress Notes (Signed)
   07/23/17 0930  Clinical Encounter Type  Visited With Patient  Visit Type Follow-up  Referral From Nurse  Consult/Referral To Chaplain  Spiritual Encounters  Spiritual Needs Emotional   CH followed up with PT during rounding on 1C. PT seemed to be happy as she had some good lab results. CH provided emotional support for PT.

## 2017-07-23 NOTE — Patient Instructions (Signed)
Neutropenic Fever Neutropenic fever is a type of fever that can develop in someone who has a very low number of a certain kind of white blood cells called neutrophils (neutropenia). These blood cells are important for fighting infections caused by bacteria and fungi. When you have neutropenia, you could be in danger of a severe infection. You may need to start taking antibiotic medicines. What are the causes? Neutrophils are made in the spongy tissue inside your bones (bone marrow). Anything that damages your bone marrow or damages neutrophils after they leave your bone marrow can cause neutropenia. Once you have a dangerously low level of neutrophils, you are at risk for infection and neutropenic fever. Causes of neutropenia may include:  Cancer treatments.  Bone marrow cancer.  Cancer of the white blood cells (leukemia or myeloma).  Severe infection.  Bone marrow failure (aplastic anemia).  Many types of medicines.  Diseases of the body's defense system (autoimmune diseases).  Inherited genes that cause neutropenia.  Vitamin B deficiency.  Spleen enlargement in rheumatoid arthritis (Felty syndrome).  What are the signs or symptoms? Fever is the main symptom of neutropenic fever. Other signs and symptoms may include:  Chills.  Fatigue.  Painful mouth ulcers.  Cough.  Shortness of breath.  Swollen glands (lymph nodes).  Sore throat.  Sinus and ear infections.  Gum disease.  Skin infection.  Burning and frequent urination.  Rectal infections.  Vaginal discharge or itching.  How is this diagnosed? Your health care provider may diagnose neutropenic fever if your neutrophil count is less than 500 neutrophils per microliter of blood and you have a fever of at least 100.30F (38.0C).  Blood tests and other tests that measure neutrophils will be done. These may include: ? A complete blood count (CBC) and a differential white blood count (WBC). ? Peripheral smear.  This test involves checking a blood sample under a microscope.  Other types of tests may also be done, including: ? Chest X-rays. ? Cultures of blood and body fluids to look for a source of infection.  Your health care provider will also determine if your neutropenic fever is high risk or low risk.  You may have high-risk neutropenic fever if: ? Your neutrophil count is less than 100 neutrophils per microliter of blood. ? You have also been diagnosed with pneumonia or another serious medical problem. ? Your condition requires you to be treated in the hospital.  You may have low-risk neutropenic fever if: ? Your neutrophil count is more than 100 neutrophils per microliter of blood. ? Your chest X-ray is normal. ? You do not have an active illness or any other problems that require you to be in the hospital.  How is this treated? You may start treatment as soon as you get diagnosed with neutropenic fever, even if your health care provider is still looking for the source of infection.  Treatment for high-risk neutropenic fever is antibiotic medicine given through an IV access tube. This is done in the hospital. You may be given a single antibiotic or a combination of antibiotics.  Low-risk neutropenic fever may be treated at home. You may have to take one or two different oral antibiotics. In some cases, you may need to be treated with IV antibiotics that are given by a home health care provider who visits your home.  If your health care provider finds a specific cause of infection, you may be switched to the antibiotics that work best against those particular bacteria.  If  a fungal infection is found, your medicine will be changed to an antifungal medicine.  If the fever goes away in 3-5 days, you may have to take medicine for about 7 days. If the fever is not responding, you may have to take medicine longer.  You may have to stop taking any medicine that could be causing neutropenic  fever.  If you have neutropenic fever from cancer treatment drugs (chemotherapy), you may need to take a type of medicine called white blood cell growth factors. This medicine can help prevent fever.  Follow these instructions at home:  Only take medicines as directed by your health care provider. ? If you are being treated with oral antibiotics at home, you may need to return to your health care provider every day to have your CBC checked. You may have to do this until your fever responds. ? Take your antibiotics as directed. Make sure you finish them even if you start to feel better.  Preventing infection is important when you have neutropenia. Here are some ways to prevent infections: ? Avoid sick friends and family members. ? Wash your hands often. ? Do noteat uncooked or undercooked meats. ? Wash all fruits and vegetables. ? Do not eat or drink unpasteurized dairy products. ? Get regular dental care, and maintain good dental hygiene. ? Get a flu shot. Ask your health care provider whether you need any other vaccines. ? Wear gloves when gardening.  Follow up with your health care provider as directed. Contact a health care provider if:  You have chills.  You have a fever.  You have signs or symptoms of infection. Get help right away if:  You have trouble breathing.  You have chest pain. This information is not intended to replace advice given to you by your health care provider. Make sure you discuss any questions you have with your health care provider. Document Released: 03/03/2013 Document Revised: 08/04/2015 Document Reviewed: 08/31/2015 Elsevier Interactive Patient Education  2018 [employer].  Neutropenia Neutropenia is a condition that occurs when you have a lower-than-normal level of a type of white blood cell (neutrophil) in your body. Neutrophils are made in the spongy center of large bones (bone marrow) and they fight infections. Neutrophils are your body's  main defense against bacterial and fungal infections. The fewer neutrophils you have and the longer your body remains without them, the greater your risk of getting a severe infection. What are the causes? This condition can occur if your body uses up or destroys neutrophils faster than your bone marrow can make them. This problem may happen because of:  Bacterial or fungal infection.  Allergic disorders.  Reactions to some medicines.  Autoimmune disease.  An enlarged spleen.  This condition can also occur if your bone marrow does not produce enough neutrophils. This problem may be caused by:  Cancer.  Cancer treatments, such as radiation or chemotherapy.  Viral infections.  Medicines, such as phenytoin.  Vitamin B12 deficiency.  Diseases of the bone marrow.  Environmental toxins, such as insecticides.  What are the signs or symptoms? This condition does not usually cause symptoms. If symptoms are present, they are usually caused by an underlying infection. Symptoms of an infection may include:  Fever.  Chills.  Swollen glands.  Oral or anal ulcers.  Cough and shortness of breath.  Rash.  Skin infection.  Fatigue.  How is this diagnosed? Your health care provider may suspect neutropenia if you have:  A condition that may cause  neutropenia.  Symptoms of infection, especially fever.  Frequent and unusual infections.  You will have a medical history and physical exam. Tests will also be done, such as:  A complete blood count (CBC).  A procedure to collect a sample of bone marrow for examination (bone marrow biopsy).  A chest X-ray.  A urine culture.  A blood culture.  How is this treated? Treatment depends on the underlying cause and severity of your condition. Mild neutropenia may not require treatment. Treatment may include medicines, such as:  Antibiotic medicine given through an IV tube.  Antiviral medicines.  Antifungal medicines.  A  medicine to increase neutrophil production (colony-stimulating factor). You may get this drug through an IV tube or by injection.  Steroids given through an IV tube.  If an underlying condition is causing neutropenia, you may need treatment for that condition. If medicines you are taking are causing neutropenia, your health care provider may have you stop taking those medicines. Follow these instructions at home: Medicines  Take over-the-counter and prescription medicines only as told by your health care provider.  Get a seasonal flu shot (influenza vaccine). Lifestyle  Do not eat unpasteurized foods.Do not eat unwashed raw fruits or vegetables.  Avoid exposure to groups of people or children.  Avoid being around people who are sick.  Avoid being around dirt or dust, such as in construction areas or gardens.  Do not provide direct care for pets. Avoid animal droppings. Do not clean litter boxes and bird cages. Hygiene   Bathe daily.  Clean the area between the genitals and the anus (perineal area) after you urinate or have a bowel movement. If you are female, wipe from front to back.  Brush your teeth with a soft toothbrush before and after meals.  Do not use a razor that has a blade. Use an electric razor to remove hair.  Wash your hands often. Make sure others who come in contact with you also wash their hands. If soap and water are not available, use hand sanitizer. General instructions  Do not have sex unless your health care provider has approved.  Take actions to avoid cuts and burns. For example: ? Be cautious when you use knives. Always cut away from yourself. ? Keep knives in protective sheaths or guards when not in use. ? Use oven mitts when you cook with a hot stove, oven, or grill. ? Stand a safe distance away from open fires.  Avoid people who received a vaccine in the past 30 days if that vaccine contained a live version of the germ (live vaccine). You should  not get a live vaccine. Common live vaccines are varicella, measles, mumps, and rubella.  Do not share food utensils.  Do not use tampons, enemas, or rectal suppositories unless your health care provider has approved.  Keep all appointments as told by your health care provider. This is important. Contact a health care provider if:  You have a fever.  You have chills or you start to shake.  You have: ? A sore throat. ? A warm, red, or tender area on your skin. ? A cough. ? Frequent or painful urination. ? Vaginal discharge or itching.  You develop: ? Sores in your mouth or anus. ? Swollen lymph nodes. ? Red streaks on the skin. ? A rash.  You feel: ? Nauseous or you vomit. ? Very fatigued. ? Short of breath. This information is not intended to replace advice given to you by your health  care provider. Make sure you discuss any questions you have with your health care provider. Document Released: 08/18/2001 Document Revised: 08/04/2015 Document Reviewed: 09/08/2014 Elsevier Interactive Patient Education  2018 [employer].  Managing Low Blood Counts During Cancer Treatment Cancer treatments such as chemotherapy and radiation can sometimes cause a drop in the supply of blood cells in the body, including red blood cells, white blood cells, and platelets. These blood cells are produced in the body and are released into the blood to perform specific functions:  Red blood cells carry gases such as oxygen and carbon dioxide to and from your lungs.  White blood cells help protect you from infection.  Platelets help your body to form blood clots to prevent and control bleeding.  When cancer treatments cause a drop in blood cell counts, your body may not have enough cells to keep up its normal functions. Symptoms or problems that may result will vary depending on which type of blood cells the treatment is affecting. If your blood counts are low, you can take steps to help manage any  problems. How can low blood counts affect me? Low blood counts have various effects depending on the type of blood cells involved:  If you have a low number of red blood cells, you have a condition called anemia. This can cause symptoms such as: ? Feeling tired and weak. ? Feeling light-headed. ? Being short of breath.  If you have a low number of white blood cells, you may be at higher risk for infections.  If you have a low number of platelets, you may bleed more easily, or your body may have trouble stopping any bleeding. You may also have more bruising.  How to manage symptoms or prevent problems from a low blood count If you have a low blood count, you can take steps to manage symptoms or prevent problems that may develop. The steps to take will depend on which type of blood cell is low. Low red blood cells Take these steps to help manage the symptoms of anemia:  Go for a walk or do some light exercise each day.  Take short naps during the day.  Eat foods that contain a lot of iron and protein. These include leafy vegetables, meat and fish, beans, sweet potatoes, and dried fruit such as prunes, raisins, and apricots.  Ask for help with errands and with work that needs to be done around the house. It is important to save your energy.  Take vitamins or supplements-such as iron, vitamin R74, or folic acid-as told by your health care provider.  Practice relaxation techniques, such as yoga or meditation.  Low white blood cells Take these steps to help prevent infections:  Wash your hands often with warm, soapy water.  Avoid crowds of people and any person who has the flu or a fever.  Take care when cleaning yourself after using the bathroom. Tell your health care provider if you have any rectal sores or bleeding.  Avoid dental work. Check your mouth each day for sores or signs of infection.  Do not share utensils.  Avoid contact with pet waste. Wash your hands after  handling pets.  If you get a scrape or cut, clean it thoroughly right away.  Avoid fresh plants or dried flowers.  Do not swim or wade in lakes, ponds, rivers, water parks, or hot tubs.  Follow food safety guidelines. Cook meat thoroughly and wash all raw fruits and vegetables.  You may be instructed  to wear a mask when around others to protect yourself.  Low platelets Take these steps to help prevent or control bleeding and bruising:  Use an electric razor for shaving instead of a blade.  Use a soft toothbrush and be careful during oral care. Talk with your cancer care team about whether you should avoid flossing. If your mouth is bleeding, rinse it with ice water.  Avoid activities that could cause injury, such as contact sports.  Talk with your health care provider about using laxatives or stool softeners to avoid constipation.  Do not use medicines such as ibuprofen, aspirin, or naproxen unless your health care provider tells you to.  Limit alcohol use.  Monitor any bleeding closely. If you start bleeding, hold pressure on the area for 5 minutes to stop the bleeding. Bleeding that does not stop is considered an emergency.  What treatments can help increase a low blood count? If needed, your health care provider may recommend treatment for a low blood count. Treatment will depend on the type of blood cell that is low and the severity of your condition. Treatment options may include:  Taking medicines to help stimulate the growth of blood cells. This is an option for treating a low red blood cell count. Your health care provider may also recommend that you take iron, folic acid, or vitamin B12 supplements.  Making dietary changes. Including more iron and protein in your diet can help stimulate the growth of red blood cells.  Adjusting your current medicines to help raise blood counts.  Making changes to your treatment plan.  Having a blood transfusion. This may be done if  your blood count is very low.  Contact a health care provider if:  You feel extremely tired and weak.  You have more bruising or bleeding.  You feel ill or you develop a cough.  You have swelling or redness.  You have mouth sores or a sore throat.  You have painful urination or you have blood in your urine or stool.  You are thinking of taking any new supplements or vitamins or making dietary changes. Get help right away if:  You are short of breath, have chest pain, or feel dizzy.  You have a fever or chills.  You have abdominal pain or diarrhea.  You have bleeding that will not stop. Summary  Cancer treatments such as chemotherapy and radiation can sometimes cause a drop in the supply of blood cells in the body, including red blood cells, white blood cells, and platelets.  If you have a low blood count, you can take steps to manage symptoms or prevent problems that may develop.  Depending on which type of blood cell is low, you may need to take steps to prevent infection, prevent bleeding, or manage symptoms that may develop.  If needed, your health care provider may recommend treatment for a low blood count. This information is not intended to replace advice given to you by your health care provider. Make sure you discuss any questions you have with your health care provider. Document Released: 10/22/2015 Document Revised: 10/22/2015 Document Reviewed: 10/22/2015 Elsevier Interactive Patient Education  [employer].

## 2017-07-24 ENCOUNTER — Ambulatory Visit: Payer: Medicare Other

## 2017-07-24 DIAGNOSIS — R0602 Shortness of breath: Secondary | ICD-10-CM

## 2017-07-24 LAB — PREPARE PLATELET PHERESIS: Unit division: 0

## 2017-07-24 LAB — MAGNESIUM: MAGNESIUM: 1.8 mg/dL (ref 1.7–2.4)

## 2017-07-24 LAB — BPAM PLATELET PHERESIS
Blood Product Expiration Date: 201905141430
ISSUE DATE / TIME: 201905141422
UNIT TYPE AND RH: 5100

## 2017-07-24 MED ORDER — MAGNESIUM SULFATE 2 GM/50ML IV SOLN
2.0000 g | Freq: Once | INTRAVENOUS | Status: AC
  Administered 2017-07-24: 2 g via INTRAVENOUS
  Filled 2017-07-24: qty 50

## 2017-07-24 NOTE — Care Management Important Message (Signed)
Important Message  Patient Details  Name: Shelly Arias MRN: [id number] Date of Birth: Jul 07, 1945   Medicare Important Message Given:  Yes    Juliann Pulse A Tevita Gomer 07/24/2017, 10:33 AM

## 2017-07-24 NOTE — Progress Notes (Signed)
[place] [facility] [place] NAME: Shelly Arias    MR#:  [id number]  DATE OF BIRTH:  Jun 03, 1945  Patient denies any complaints.  Eager to go home.  Received blood and platelets also.  CHIEF COMPLAINT:   Chief Complaint  Patient presents with  . Cancer  . Fever    REVIEW OF SYSTEMS:    Review of Systems  Constitutional: Positive for malaise/fatigue and weight loss. Negative for chills and fever.  HENT: Negative for hearing loss.   Eyes: Negative for blurred vision, double vision and photophobia.  Respiratory: Negative for cough, hemoptysis and shortness of breath.   Cardiovascular: Negative for palpitations, orthopnea and leg swelling.  Gastrointestinal: Negative for abdominal pain, diarrhea and vomiting.  Genitourinary: Negative for dysuria and urgency.  Musculoskeletal: Negative for myalgias and neck pain.  Skin: Negative for rash.  Neurological: Negative for dizziness, focal weakness, seizures, weakness and headaches.  Psychiatric/Behavioral: Negative for memory loss. The patient does not have insomnia.     Nutrition:  poor appetite and does not want to eat Tolerating Diet: Tolerating PT:      DRUG ALLERGIES:   Allergies  Allergen Reactions  . Codeine Anaphylaxis  . Fish-Derived Products Anaphylaxis  . Morphine Sulfate Anaphylaxis    REACTION: anaphylaxis  . Adhesive [Tape]     PAPER TAPE OK TO USE  . Oxycodone     Nausea and vomiting    VITALS:  Blood pressure 130/69, pulse 74, temperature (!) 97.5 F (36.4 C), temperature source Oral, resp. rate 17, height 5\' 1"  (1.549 m), weight 60.3 kg (133 lb), SpO2 98 %.  PHYSICAL EXAMINATION:   Physical Exam  GENERAL:  72 y.o.-year-old patient lying in the bed with no acute distress.  Appears cachectic. EYES: Pupils equal, round, pale conjunctiva.  HEENT: Head atraumatic, normocephalic. Oropharynx and nasopharynx clear.  NECK:  Supple, no jugular venous distention. No  thyroid enlargement, no tenderness.  LUNGS: Normal breath sounds bilaterally, no wheezing, rales,rhonchi or crepitation. No use of accessory muscles of respiration.  CARDIOVASCULAR: S1, S2 normal. No murmurs, rubs, or gallops.  ABDOMEN: Soft, nontender, nondistended. Bowel sounds present. No organomegaly or mass.  EXTREMITIES: .  Slight 1+ pedal edema.  NEUROLOGIC: Cranial nerves II through XII are intact. Muscle strength 5/5 in all extremities. Sensation intact. Gait not checked.  PSYCHIATRIC: The patient is alert and oriented x 3.  SKIN: No obvious rash, lesion, or ulcer.    LABORATORY PANEL:   CBC Recent Labs  Lab 07/22/17 0406 07/23/17 0244  WBC 0.8*  --   HGB 6.2* 10.2*  HCT 17.7* 29.3*  PLT 7*  --    ------------------------------------------------------------------------------------------------------------------  Chemistries  Recent Labs  Lab 07/21/17 1532 07/22/17 0406  07/24/17 0725  NA 133* 131*  --   --   K 4.1 3.9  --   --   CL 98* 100*  --   --   CO2 26 24  --   --   GLUCOSE 124* 111*  --   --   BUN 19 12  --   --   CREATININE 0.70 0.74  --   --   CALCIUM 8.7* 7.9*  --   --   MG  --  1.5*   < > 1.8  AST 18  --   --   --   ALT 21  --   --   --   ALKPHOS 57  --   --   --  BILITOT 0.5  --   --   --    < > = values in this interval not displayed.   ------------------------------------------------------------------------------------------------------------------  Cardiac Enzymes Recent Labs  Lab 07/21/17 1558  TROPONINI <0.03   ------------------------------------------------------------------------------------------------------------------  RADIOLOGY:  No results found.   ASSESSMENT AND PLAN:   Active Problems:   Fever   Neutropenic fever: Continue broad-spectrum antibiotics with vancomycin, cefepime.  So far blood, urine cultures have been negative.  Continue vancomycin, cefepime A. fib patient has to be afebrile for 24 hours, likely  discharge home tomorrow, discussed with Dr. Rogue Bussing recommended to keep her till tomorrow and discharge tomorrow. 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 #3 history of nonsustained V. tach: Resolved  #4 history of COPD 5.  GERD 6.  History of liver cancer, metastatic lung cancer now Patient high risk for cardiac arrest, CODE STATUS full code. If afebrile for 24 hours, discharge home tomorrow.  Patient agreeable for this plan.   All the records are reviewed and case discussed with Care Management/Social Workerr. Management plans discussed with the patient, family and they are in agreement.  CODE STATUS: full  TOTAL TIME TAKING CARE OF THIS PATIENT:  55 minutes.   POSSIBLE D/C IN  1-2DAYS, DEPENDING ON CLINICAL CONDITION.   Epifanio Lesches M.D on 07/24/2017 at 12:28 PM  Between 7am to 6pm - Pager - [phone]  After 6pm go to [email or web address] - password [email or web address] [facility]  [facility]  Office  [phone]  CC: Primary care physician; [name], MD

## 2017-07-24 NOTE — Progress Notes (Signed)
Pharmacy Antibiotic Note  Shelly Arias is a 72 y.o. female admitted on 07/21/2017 with sepsis.  Pharmacy has been consulted for vancomycin and Zosyn dosing.  Plan: Zosyn 3.375g IV q8h (Extended Infusion)   Vancomycin 750mg  IV q24h with a 14 hour stack dose (second dose given 14 hours after first dose). Vancomycin trough estimate 15.3 with a goal of 15-20. Will check first vancomycin trough before 4th dose.   T1/2 16.1, Vd 33.5, Ke 0.043  Height: 5\' 1"  (154.9 cm) Weight: 133 lb (60.3 kg) IBW/kg (Calculated) : 47.8  Temp (24hrs), Avg:98.6 F (37 C), Min:97.5 F (36.4 C), Max:100.2 F (37.9 C)  Recent Labs  Lab 07/18/17 1420 07/21/17 1532 07/22/17 0406  WBC 0.7* 1.0* 0.8*  CREATININE  --  0.70 0.74  LATICACIDVEN  --  1.6  --     Estimated Creatinine Clearance: 55.3 mL/min (by C-G formula based on SCr of 0.74 mg/dL).    Allergies  Allergen Reactions  . Codeine Anaphylaxis  . Fish-Derived Products Anaphylaxis  . Morphine Sulfate Anaphylaxis    REACTION: anaphylaxis  . Adhesive [Tape]     PAPER TAPE OK TO USE  . Oxycodone     Nausea and vomiting    Antimicrobials this admission: Zosyn 5/12 >>  Vancomycin 5/12 >>  Dose adjustments this admission: 0515 vanc level not drawn before dose administered. Rescheduled for 5/16 AM   Microbiology results: 5/12 BCx: sent 5/12 UCx: sent 5/12 MRSA PCR: sent  Thank you for allowing pharmacy to be a part of this patient's care.  Eloise Harman, PharmD Pharmacy Resident  07/24/2017 6:29 AM

## 2017-07-24 NOTE — Progress Notes (Signed)
Shelly Arias   DOB:1945/09/14   MW#:[id number]    Subjective: Patient feels much better since yesterday.  She had one temperature of 100.4 yesterday.  Denies any cough.  Admits to shortness of breath on exertion.  Poor appetite.  Otherwise no nausea no vomiting no blood in stools no black or stools.  No more Rigors.  She is eager to go home.  Objective:  Vitals:   07/24/17 1530 07/24/17 2021  BP: (!) 146/65 (!) 158/76  Pulse: 80 91  Resp: (!) 24 18  Temp: 98.6 F (37 C) 98.7 F (37.1 C)  SpO2: 99% 100%     Intake/Output Summary (Last 24 hours) at 07/24/2017 2152 Last data filed at 07/24/2017 1853 Gross per 24 hour  Intake 240 ml  Output -  Net 240 ml   GENERAL: Well-nourished well-developed; Alert, no distress and comfortable.    She is accompanied with husband. EYES: Positive for pallor no icterus. OROPHARYNX: no thrush or ulceration. NECK: supple, no masses felt LYMPH:  no palpable lymphadenopathy in the cervical, axillary or inguinal regions LUNGS: decreased breath sounds to auscultation at bases and  No wheeze or crackles HEART/CVS: regular rate & rhythm and no murmurs; No lower extremity edema ABDOMEN: abdomen soft, non-tender and normal bowel sounds Musculoskeletal:no cyanosis of digits and no clubbing  PSYCH: alert & oriented x 3 with fluent speech NEURO: no focal motor/sensory deficits SKIN:  no rashes or significant lesions   Labs:  Lab Results  Component Value Date   WBC 0.8 (LL) 07/22/2017   HGB 10.2 (L) 07/23/2017   HCT 29.3 (L) 07/23/2017   MCV 84.6 07/22/2017   PLT 7 (LL) 07/22/2017   NEUTROABS 0.1 (L) 07/21/2017    Lab Results  Component Value Date   NA 131 (L) 07/22/2017   K 3.9 07/22/2017   CL 100 (L) 07/22/2017   CO2 24 07/22/2017    Studies:  No results found.  Assessment & Plan:   72 year old female patient likely MDS/metastatic breast cancer-is currently admitted to the hospital for neutropenic fever  #Neutropenic fever/severe  neutropenia-etiology unclear; infectious work-up in progress blood cultures negative so far/.    Improved;  last fever approximately 24 hours ago.  Continue broad-spectrum antibiotics.  Continue Granix  #Severe anemia/thrombocytopenia-hemoglobin 6.8;  status post 2 units of transfusion hemoglobin 10.2; also platelet transfusion yesterday.  No labs available today.  # Likely MDS-status post Dacogen cycle #1 approximately 6 weeks ago-  Hold further therapy for now  #Metastatic breast cancer-currently stable; continue letrozole.  #   Disposition if patient is afebrile for the next 24 hours/labs stable-potentially could be discharged home.  Discussed with Dr. Vianne Bulls.  [name], MD 07/24/2017  9:52 PM

## 2017-07-24 NOTE — Progress Notes (Signed)
   07/24/17 1005  Clinical Encounter Type  Visited With Patient and family together  Visit Type Follow-up  Referral From Nurse  Consult/Referral To Chaplain  Spiritual Encounters  Spiritual Needs Prayer;Emotional   Eva checked in on PT while rounding on 1C. PT stated that she "was feeling better today and hopes to go home tomorrow." [facility] listened PT to talk about her family and tell funny stories. PT appeared happy, as she usually does. [place] will follow up as needed.

## 2017-07-25 ENCOUNTER — Inpatient Hospital Stay: Payer: Medicare Other

## 2017-07-25 ENCOUNTER — Encounter: Payer: Self-pay | Admitting: Diagnostic Radiology

## 2017-07-25 ENCOUNTER — Telehealth: Payer: Self-pay | Admitting: Internal Medicine

## 2017-07-25 ENCOUNTER — Ambulatory Visit: Payer: Medicare Other

## 2017-07-25 HISTORY — PX: IR FLUORO GUIDE CV LINE RIGHT: IMG2283

## 2017-07-25 LAB — CBC
HCT: 26.3 % — ABNORMAL LOW (ref 35.0–47.0)
Hemoglobin: 9.3 g/dL — ABNORMAL LOW (ref 12.0–16.0)
MCH: 30.5 pg (ref 26.0–34.0)
MCHC: 35.2 g/dL (ref 32.0–36.0)
MCV: 86.7 fL (ref 80.0–100.0)
Platelets: 19 K/uL — CL (ref 150–440)
RBC: 3.04 MIL/uL — ABNORMAL LOW (ref 3.80–5.20)
RDW: 14.1 % (ref 11.5–14.5)
WBC: 0.8 K/uL — CL (ref 3.6–11.0)

## 2017-07-25 LAB — VANCOMYCIN, TROUGH: VANCOMYCIN TR: 10 ug/mL — AB (ref 15–20)

## 2017-07-25 MED ORDER — SODIUM CHLORIDE FLUSH 0.9 % IV SOLN
INTRAVENOUS | Status: AC
Start: 2017-07-25 — End: ?
  Filled 2017-07-25: qty 30

## 2017-07-25 MED ORDER — SODIUM CHLORIDE FLUSH 0.9 % IV SOLN
INTRAVENOUS | Status: AC
Start: 2017-07-25 — End: ?
  Filled 2017-07-25: qty 20

## 2017-07-25 MED ORDER — VANCOMYCIN HCL IN DEXTROSE 750-5 MG/150ML-% IV SOLN
750.0000 mg | INTRAVENOUS | Status: DC
  Filled 2017-07-25 (×2): qty 150

## 2017-07-25 MED ORDER — AMOXICILLIN-POT CLAVULANATE 875-125 MG PO TABS: 1.0000 | ORAL_TABLET | Freq: Two times a day (BID) | ORAL | 0 refills | Status: AC

## 2017-07-25 MED ORDER — SODIUM CHLORIDE 0.9% FLUSH: 10.0000 mL | INTRAVENOUS | Status: DC | PRN

## 2017-07-25 MED ORDER — IOPAMIDOL (ISOVUE-300) INJECTION 61%
30.0000 mL | Freq: Once | INTRAVENOUS | Status: AC | PRN
  Administered 2017-07-25: 13:00:00 10 mL

## 2017-07-25 NOTE — Progress Notes (Signed)
Shelly Arias   DOB:21-Sep-1945   XL#:[id number]    Subjective: Patient has not any fevers in the last 48 hours.  Appetite is fair at best.  No nausea vomiting.  No abdominal pain pain no cough.  Shortness of breath on exertion.  Very eager to go home.  Complains of Port-A-Cath malfunction.  Objective:  Vitals:   07/25/17 0747 07/25/17 1546  BP: (!) 157/72 (!) 145/68  Pulse: 90 85  Resp: 18 20  Temp: 97.9 F (36.6 C) (!) 97.5 F (36.4 C)  SpO2: 100% 99%    No intake or output data in the 24 hours ending 07/25/17 2202 GENERAL: Well-nourished well-developed; Alert, no distress and comfortable.    She is accompanied with husband. EYES: Positive for pallor no icterus. OROPHARYNX: no thrush or ulceration. NECK: supple, no masses felt LYMPH:  no palpable lymphadenopathy in the cervical, axillary or inguinal regions LUNGS: decreased breath sounds to auscultation at bases and  No wheeze or crackles HEART/CVS: regular rate & rhythm and no murmurs; No lower extremity edema ABDOMEN: abdomen soft, non-tender and normal bowel sounds Musculoskeletal:no cyanosis of digits and no clubbing  PSYCH: alert & oriented x 3 with fluent speech NEURO: no focal motor/sensory deficits SKIN:  no rashes or significant lesions   Labs:  Lab Results  Component Value Date   WBC 0.8 (LL) 07/25/2017   HGB 9.3 (L) 07/25/2017   HCT 26.3 (L) 07/25/2017   MCV 86.7 07/25/2017   PLT 19 (LL) 07/25/2017   NEUTROABS 0.1 (L) 07/21/2017    Lab Results  Component Value Date   NA 131 (L) 07/22/2017   K 3.9 07/22/2017   CL 100 (L) 07/22/2017   CO2 24 07/22/2017    Studies:  Ir Fluoro Guide Cv Line Right  Result Date: 07/25/2017 INDICATION: 72 year old with history of breast cancer and 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 Patient was admitted for neutropenic fever. Patient has a right jugular Port-A-Cath which is not aspirating. Patient presents for a port check. EXAM: PORT-A-CATH INJECTION UNDER FLUOROSCOPY  MEDICATIONS: None ANESTHESIA/SEDATION: None FLUOROSCOPY TIME:  Fluoroscopy Time: 6 seconds, 45 mGy CONTRAST:  10 mL [id number] COMPLICATIONS: None immediate. PROCEDURE: The procedure was explained to the patient. The risks and benefits of the procedure were discussed and the patient's questions were addressed. Informed consent was obtained from the patient. The right chest port was already accessed. The port flushed easily but would not aspirate. The port was flushed with contrast under fluoroscopic evaluation. Despite normal appearance of the port under fluoroscopy, the port would not aspirate. Therefore, the existing needle was completely removed. A new access needle was placed with sterile technique. The port was able to aspirate and flush with placement of the new port needle. Fluoroscopic images were taken and saved for this procedure. FINDINGS: Right jugular Port-A-Cath. Catheter tip at the superior cavoatrial junction. No evidence for port discontinuity or contrast extravasation. Contrast empties into the right atrium without evidence of a fibrin sheath. IMPRESSION: Normal appearance of the right jugular Port-A-Cath. Port-A-Cath tip is well positioned and no evidence for a fibrin sheath. The Port-A-Cath was aspirating normally after a new access needle was placed. Electronically Signed   By: Markus Daft M.D.   On: 07/25/2017 16:48    Assessment & Plan:   72 year old female patient likely MDS/metastatic breast cancer-is currently admitted to the hospital for neutropenic fever  #Neutropenic fever/severe neutropenia-etiology unclear; infectious work-up in progress blood cultures negative so far.  Improved; currently afebrile.  Continue Granix for today.  #  Severe anemia/thrombocytopenia-hemoglobin 6.8;  status post 2 units of transfusion hemoglobin 9.2.;  Status post platelet transfusion -platelets 17  # Likely MDS-status post Dacogen cycle #1 approximately 6 weeks ago-  Hold further therapy for  now  # Metastatic breast cancer-currently stable; continue letrozole.  #Port-A-Cath-malfunction-fibrin sheath versus malpositioning.   #Disposition: She could be discharged home today-on Augmentin 875 twice daily twice a day.  Discussed with Dr. Vianne Bulls.  Addendum: Port-A-Cath-fluoroscopy-no evidence of fibrin sheath.  Okay to be discharged with IV access.     [name], MD 07/25/2017  10:02 PM

## 2017-07-25 NOTE — Telephone Encounter (Signed)
Please have the patient come for labs on 5/17; Friday; Granix [facility] hold tube; blood sample for crossmatch for platelets transfusion]; likely platelet transfusion for Monday May 20th.   # Also Granix 20th through 25th  #CBC/hold tube/on 5/23; PRBC transfusion; Follow up with me.   If this is confusing just talk to me. Thx

## 2017-07-25 NOTE — Progress Notes (Addendum)
Pt requested not to deaccess portacath. Per pt tomorrow she is due for platelets transfusion. Notified Dr Vianne Bulls of the above. Per Dr Vianne Bulls not to deaccess port at discharge.

## 2017-07-25 NOTE — Progress Notes (Signed)
   07/25/17 0930  Clinical Encounter Type  Visited With Patient  Visit Type Follow-up  Referral From Nurse  Consult/Referral To Chaplain  Spiritual Encounters  Spiritual Needs Prayer;Emotional   [place] checked in with PT. PT is hoping to be discharged today. [place] will follow up as needed.

## 2017-07-25 NOTE — Progress Notes (Signed)
Pt has been discharged home. Discharge papers given and explained to pt, verbalized understanding. Meds and f/u appointments reviewed. RX to be picked up from pharmacy.

## 2017-07-25 NOTE — Progress Notes (Signed)
Pharmacy Antibiotic Note  Shelly Arias is a 72 y.o. female admitted on 07/21/2017 with sepsis.  Pharmacy has been consulted for vancomycin and Zosyn dosing.  Plan: Zosyn 3.375g IV q8h (Extended Infusion)   Vancomycin 750mg  IV q24h with a 14 hour stack dose (second dose given 14 hours after first dose). Vancomycin trough estimate 15.3 with a goal of 15-20. Will check first vancomycin trough before 4th dose.   T1/2 16.1, Vd 33.5, Ke 0.043  Height: 5\' 1"  (154.9 cm) Weight: 133 lb (60.3 kg) IBW/kg (Calculated) : 47.8  Temp (24hrs), Avg:98.4 F (36.9 C), Min:97.8 F (36.6 C), Max:98.7 F (37.1 C)  Recent Labs  Lab 07/18/17 1420 07/21/17 1532 07/22/17 0406 07/25/17 0506  WBC 0.7* 1.0* 0.8* 0.8*  CREATININE  --  0.70 0.74  --   LATICACIDVEN  --  1.6  --   --   VANCOTROUGH  --   --   --  10*    Estimated Creatinine Clearance: 55.3 mL/min (by C-G formula based on SCr of 0.74 mg/dL).    Allergies  Allergen Reactions  . Codeine Anaphylaxis  . Fish-Derived Products Anaphylaxis  . Morphine Sulfate Anaphylaxis    REACTION: anaphylaxis  . Adhesive [Tape]     PAPER TAPE OK TO USE  . Oxycodone     Nausea and vomiting    Antimicrobials this admission: Zosyn 5/12 >>  Vancomycin 5/12 >>  Dose adjustments this admission: 0515 vanc level not drawn before dose administered. Rescheduled for 5/16 AM  5/16 AM vanc level 10. Changed to 750 mg q 18 hours. Level before 4th new dose.   Microbiology results: 5/12 BCx: sent 5/12 UCx: sent 5/12 MRSA PCR: sent  Thank you for allowing pharmacy to be a part of this patient's care.  Eloise Harman, PharmD Pharmacy Resident  07/25/2017 6:45 AM

## 2017-07-25 NOTE — Progress Notes (Signed)
Patient has no fever, Port-A-Cath evaluated by vascular, patient Port-A-Cath is working and flushing well.  Needle has been adjusted according to patient. Discharge the patient home.  Discharge home with Augmentin for 7 days.  Discussed with patient.Marland Kitchen

## 2017-07-26 ENCOUNTER — Inpatient Hospital Stay: Payer: Medicare Other

## 2017-07-26 ENCOUNTER — Other Ambulatory Visit: Payer: Self-pay | Admitting: Internal Medicine

## 2017-07-26 ENCOUNTER — Telehealth: Payer: Self-pay | Admitting: Family Medicine

## 2017-07-26 DIAGNOSIS — K5900 Constipation, unspecified: Secondary | ICD-10-CM | POA: Diagnosis not present

## 2017-07-26 DIAGNOSIS — D469 Myelodysplastic syndrome, unspecified: Secondary | ICD-10-CM

## 2017-07-26 DIAGNOSIS — C787 Secondary malignant neoplasm of liver and intrahepatic bile duct: Secondary | ICD-10-CM | POA: Diagnosis not present

## 2017-07-26 DIAGNOSIS — M1990 Unspecified osteoarthritis, unspecified site: Secondary | ICD-10-CM | POA: Diagnosis not present

## 2017-07-26 DIAGNOSIS — J449 Chronic obstructive pulmonary disease, unspecified: Secondary | ICD-10-CM | POA: Diagnosis not present

## 2017-07-26 DIAGNOSIS — D696 Thrombocytopenia, unspecified: Secondary | ICD-10-CM

## 2017-07-26 DIAGNOSIS — D701 Agranulocytosis secondary to cancer chemotherapy: Secondary | ICD-10-CM | POA: Diagnosis not present

## 2017-07-26 DIAGNOSIS — D649 Anemia, unspecified: Secondary | ICD-10-CM | POA: Diagnosis not present

## 2017-07-26 DIAGNOSIS — R0602 Shortness of breath: Secondary | ICD-10-CM | POA: Diagnosis not present

## 2017-07-26 DIAGNOSIS — M810 Age-related osteoporosis without current pathological fracture: Secondary | ICD-10-CM | POA: Diagnosis not present

## 2017-07-26 DIAGNOSIS — E785 Hyperlipidemia, unspecified: Secondary | ICD-10-CM | POA: Diagnosis not present

## 2017-07-26 DIAGNOSIS — R51 Headache: Secondary | ICD-10-CM | POA: Diagnosis not present

## 2017-07-26 DIAGNOSIS — K1230 Oral mucositis (ulcerative), unspecified: Secondary | ICD-10-CM | POA: Diagnosis not present

## 2017-07-26 DIAGNOSIS — I10 Essential (primary) hypertension: Secondary | ICD-10-CM | POA: Diagnosis not present

## 2017-07-26 DIAGNOSIS — F17210 Nicotine dependence, cigarettes, uncomplicated: Secondary | ICD-10-CM | POA: Diagnosis not present

## 2017-07-26 DIAGNOSIS — Z9221 Personal history of antineoplastic chemotherapy: Secondary | ICD-10-CM | POA: Diagnosis not present

## 2017-07-26 DIAGNOSIS — K219 Gastro-esophageal reflux disease without esophagitis: Secondary | ICD-10-CM | POA: Diagnosis not present

## 2017-07-26 DIAGNOSIS — R5383 Other fatigue: Secondary | ICD-10-CM | POA: Diagnosis not present

## 2017-07-26 DIAGNOSIS — E46 Unspecified protein-calorie malnutrition: Secondary | ICD-10-CM | POA: Diagnosis not present

## 2017-07-26 DIAGNOSIS — C50811 Malignant neoplasm of overlapping sites of right female breast: Secondary | ICD-10-CM

## 2017-07-26 DIAGNOSIS — Z170 Estrogen receptor positive status [ER+]: Secondary | ICD-10-CM

## 2017-07-26 DIAGNOSIS — C7800 Secondary malignant neoplasm of unspecified lung: Secondary | ICD-10-CM | POA: Diagnosis not present

## 2017-07-26 DIAGNOSIS — R630 Anorexia: Secondary | ICD-10-CM | POA: Diagnosis not present

## 2017-07-26 DIAGNOSIS — D693 Immune thrombocytopenic purpura: Secondary | ICD-10-CM

## 2017-07-26 DIAGNOSIS — Z8614 Personal history of Methicillin resistant Staphylococcus aureus infection: Secondary | ICD-10-CM | POA: Diagnosis not present

## 2017-07-26 LAB — CULTURE, BLOOD (ROUTINE X 2)
CULTURE: NO GROWTH
CULTURE: NO GROWTH

## 2017-07-26 LAB — CBC WITH DIFFERENTIAL/PLATELET
Basophils Absolute: 0 10*3/uL (ref 0–0.1)
Basophils Relative: 0 %
Eosinophils Absolute: 0 10*3/uL (ref 0–0.7)
Eosinophils Relative: 0 %
HCT: 27.2 % — ABNORMAL LOW (ref 35.0–47.0)
HEMOGLOBIN: 9.7 g/dL — AB (ref 12.0–16.0)
LYMPHS ABS: 0.5 10*3/uL — AB (ref 1.0–3.6)
LYMPHS PCT: 87 %
MCH: 30.6 pg (ref 26.0–34.0)
MCHC: 35.5 g/dL (ref 32.0–36.0)
MCV: 86.2 fL (ref 80.0–100.0)
MONOS PCT: 3 %
Monocytes Absolute: 0 10*3/uL — ABNORMAL LOW (ref 0.2–0.9)
NEUTROS PCT: 10 %
Neutro Abs: 0.1 10*3/uL — ABNORMAL LOW (ref 1.4–6.5)
Platelets: 12 10*3/uL — CL (ref 150–400)
RBC: 3.16 MIL/uL — ABNORMAL LOW (ref 3.80–5.20)
RDW: 14.4 % (ref 11.5–14.5)
WBC: 0.6 10*3/uL — CL (ref 3.6–11.0)

## 2017-07-26 LAB — SAMPLE TO BLOOD BANK

## 2017-07-26 MED ORDER — HEPARIN SOD (PORK) LOCK FLUSH 100 UNIT/ML IV SOLN
INTRAVENOUS | Status: AC
  Filled 2017-07-26: qty 5

## 2017-07-26 MED ORDER — TBO-FILGRASTIM 480 MCG/0.8ML ~~LOC~~ SOSY
480.0000 ug | PREFILLED_SYRINGE | Freq: Once | SUBCUTANEOUS | Status: AC
  Administered 2017-07-26: 480 ug via SUBCUTANEOUS

## 2017-07-26 NOTE — Telephone Encounter (Signed)
Appointments have been scheduled and platelets have been ordered for Monday 07/29/17.

## 2017-07-26 NOTE — Telephone Encounter (Signed)
First attempt made for TCM, left message for patient to return call to office.

## 2017-07-29 ENCOUNTER — Inpatient Hospital Stay: Payer: Medicare Other

## 2017-07-29 ENCOUNTER — Other Ambulatory Visit: Payer: Self-pay | Admitting: *Deleted

## 2017-07-29 VITALS — BP 112/66 | HR 95 | Temp 97.8°F | Resp 20

## 2017-07-29 DIAGNOSIS — C50811 Malignant neoplasm of overlapping sites of right female breast: Secondary | ICD-10-CM

## 2017-07-29 DIAGNOSIS — D693 Immune thrombocytopenic purpura: Secondary | ICD-10-CM

## 2017-07-29 DIAGNOSIS — C7800 Secondary malignant neoplasm of unspecified lung: Secondary | ICD-10-CM | POA: Diagnosis not present

## 2017-07-29 DIAGNOSIS — Z170 Estrogen receptor positive status [ER+]: Secondary | ICD-10-CM

## 2017-07-29 DIAGNOSIS — F411 Generalized anxiety disorder: Secondary | ICD-10-CM

## 2017-07-29 DIAGNOSIS — D696 Thrombocytopenia, unspecified: Secondary | ICD-10-CM

## 2017-07-29 DIAGNOSIS — D469 Myelodysplastic syndrome, unspecified: Secondary | ICD-10-CM | POA: Diagnosis not present

## 2017-07-29 DIAGNOSIS — C787 Secondary malignant neoplasm of liver and intrahepatic bile duct: Secondary | ICD-10-CM | POA: Diagnosis not present

## 2017-07-29 DIAGNOSIS — D649 Anemia, unspecified: Secondary | ICD-10-CM | POA: Diagnosis not present

## 2017-07-29 LAB — SAMPLE TO BLOOD BANK

## 2017-07-29 LAB — CBC WITH DIFFERENTIAL/PLATELET
BASOS PCT: 1 %
Basophils Absolute: 0 10*3/uL (ref 0–0.1)
EOS ABS: 0 10*3/uL (ref 0–0.7)
Eosinophils Relative: 1 %
HCT: 23.7 % — ABNORMAL LOW (ref 35.0–47.0)
Hemoglobin: 8.4 g/dL — ABNORMAL LOW (ref 12.0–16.0)
Lymphocytes Relative: 80 %
Lymphs Abs: 0.5 10*3/uL — ABNORMAL LOW (ref 1.0–3.6)
MCH: 30.6 pg (ref 26.0–34.0)
MCHC: 35.3 g/dL (ref 32.0–36.0)
MCV: 86.6 fL (ref 80.0–100.0)
MONO ABS: 0 10*3/uL — AB (ref 0.2–0.9)
MONOS PCT: 1 %
Neutro Abs: 0.1 10*3/uL — ABNORMAL LOW (ref 1.4–6.5)
Neutrophils Relative %: 17 %
Platelets: 6 10*3/uL — CL (ref 150–400)
RBC: 2.74 MIL/uL — ABNORMAL LOW (ref 3.80–5.20)
RDW: 14.2 % (ref 11.5–14.5)
WBC: 0.6 10*3/uL — CL (ref 3.6–11.0)

## 2017-07-29 MED ORDER — TBO-FILGRASTIM 480 MCG/0.8ML ~~LOC~~ SOSY
480.0000 ug | PREFILLED_SYRINGE | Freq: Once | SUBCUTANEOUS | Status: AC
  Administered 2017-07-29: 480 ug via SUBCUTANEOUS

## 2017-07-29 MED ORDER — ACETAMINOPHEN 325 MG PO TABS
650.0000 mg | ORAL_TABLET | Freq: Once | ORAL | Status: AC
  Administered 2017-07-29: 650 mg via ORAL
  Filled 2017-07-29: qty 2

## 2017-07-29 MED ORDER — SODIUM CHLORIDE 0.9 % IV SOLN
INTRAVENOUS | Status: AC
  Administered 2017-07-29: 14:00:00 via INTRAVENOUS
  Filled 2017-07-29: qty 1000

## 2017-07-29 MED ORDER — DIPHENHYDRAMINE HCL 25 MG PO CAPS
25.0000 mg | ORAL_CAPSULE | Freq: Once | ORAL | Status: AC
  Administered 2017-07-29: 25 mg via ORAL
  Filled 2017-07-29: qty 1

## 2017-07-29 MED ORDER — METHYLPREDNISOLONE SODIUM SUCC 125 MG IJ SOLR
40.0000 mg | Freq: Once | INTRAMUSCULAR | Status: AC
  Administered 2017-07-29: 40 mg via INTRAVENOUS
  Filled 2017-07-29: qty 2

## 2017-07-29 MED ORDER — SODIUM CHLORIDE 0.9% FLUSH
10.0000 mL | INTRAVENOUS | Status: AC | PRN
  Administered 2017-07-29: 10 mL via INTRAVENOUS
  Filled 2017-07-29: qty 10

## 2017-07-29 MED ORDER — HEPARIN SOD (PORK) LOCK FLUSH 100 UNIT/ML IV SOLN
500.0000 [IU] | Freq: Once | INTRAVENOUS | Status: AC
  Administered 2017-07-29: 500 [IU] via INTRAVENOUS
  Filled 2017-07-29: qty 5

## 2017-07-29 NOTE — Discharge Summary (Signed)
Shelly Arias, is a 72 y.o. female  DOB 02-27-1946  MRN [id number].  Admission date:  07/21/2017  Admitting Physician  Bettey Costa, MD  Discharge Date:  07/25/2017   Primary MD  [name], MD  Recommendations for primary care physician for things to follow:  Follow-up with PCP in 1 week Follow-up with Dr. Rogue Bussing as scheduled   Admission Diagnosis  Neutropenic fever ([place]) [D70.9, R50.81] Chemotherapy induced neutropenia ([place]) [D70.1, T45.1X5A] Sepsis, due to unspecified organism [facility]) [A41.9]   Discharge Diagnosis  Neutropenic fever ([place]) [D70.9, R50.81] Chemotherapy induced neutropenia ([place]) [D70.1, T45.1X5A] Sepsis, due to unspecified organism [facility]) [A41.9]    Active Problems:   Fever      Past Medical History:  Diagnosis Date  . Blood dyscrasia   . Breast cancer ([place])    right, lumpectomy, radiation, chemo  . Breast cancer ([place])    Right, 2007  . Breast cancer ([place]) 06/20/2016   INVASIVE DUCTAL CARCINOMA.  . Collapsed lung 02/14/2017   RIGHT  . Complication of anesthesia    PT STATED AFTER BIL HIP SURGERIES SHE STOPPED BREATHING THE NIGHT AFTER SURGERY  . COPD (chronic obstructive pulmonary disease) ([place])   . GERD (gastroesophageal reflux disease)   . Headache   . History of chemotherapy   . History of methicillin resistant staphylococcus aureus (MRSA) 2011  . History of radiation therapy   . Hyperlipidemia   . Hypertension   . Liver cancer ([place]) 05/2016  . Lung cancer ([place]) 05/2016  . Osteoarthritis    right hip  . Osteoporosis   . 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 ABDOMINAL HYSTERECTOMY    . BREAST BIOPSY Left 2002   left breast, calcifications  . BREAST BIOPSY Left 06/20/2016   INVASIVE DUCTAL  CARCINOMA.  Marland Kitchen BREAST EXCISIONAL BIOPSY Right 2007   positive  . bunion repair    . ESOPHAGOGASTRODUODENOSCOPY (EGD) WITH PROPOFOL N/A 08/05/2016   Procedure: ESOPHAGOGASTRODUODENOSCOPY (EGD) WITH PROPOFOL;  Surgeon: San Jetty, MD;  Location: [facility] ENDOSCOPY;  Service: General;  Laterality: N/A;  . IR FLUORO GUIDE CV LINE RIGHT  07/25/2017  . JOINT REPLACEMENT    . left breast biopsy    . NASAL SINUS SURGERY    . PORTACATH PLACEMENT Right 04/13/2017   Procedure: INSERTION PORT-A-CATH- RIGHT INTERNAL JUGULAR;  Surgeon: Robert Bellow, MD;  Location: [facility] ORS;  Service: General;  Laterality: Right;  . right hip replacement    . SHOULDER SURGERY    . SQUAMOUS CELL CARCINOMA EXCISION     right leg, Dr. Nicole Kindred  . TOTAL HIP ARTHROPLASTY     right       History of present illness and  Hospital Course:     Kindly see H&P for history of present illness and admission details, please review complete Labs, Consult reports and Test reports for all details in brief  HPI  from the history and physical done on the day of admission 72 year old female patient with history of MDS, metastatic breast cancer came in because of fever, neutropenia.:  Had fever, chills at home and absolute neutrophil count 0 so admitted for neutropenic fever.   Hospital Course  72 year old female patient likely MDS/metastatic breast cancer-is currently admitted to the hospital for neutropenic fever  #Neutropenic fever/severe neutropenia-patient admitted to medical service, started on vancomycin, cefepime, blood cultures, urine cultures have been negative.  Patient absolute neutrophils are  0, seen by Dr. Rogue Bussing from oncology.  After patient has been afebrile for 24 hours discharge the patient home with Augmentin for 7 days.  #Severe anemia/thrombocytopenia-hemoglobin 6.8; status post 2 units of transfusion hemoglobin 10.2;  patient also has severe thrombocytopenia with platelets of 6, received special platelets  washed and irradiated platelets 1 unit.  His platelets are obtained from [place].   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  #Metastatic breast cancer-currently stable;continue letrozole.  #  Port-A-Cath malfunction: Seen by vascular, patient had a portogram, needle adjusted, Port-A-Cath was not flushing before but after the portogram Port-A-Cath working and flushing well.  Discharge home and advised the patient to follow-up with cancer center as scheduled for further platelet transfusion, PRBC transfusion.      Discharge Condition: stable   Follow UP  Follow-up Information    [name], MD. Go on 07/30/2017.   Specialty:  Family Medicine Why:  at 11:30 a.m. Contact information: [address] [place] [address] [phone]        [name], MD. Go on 07/26/2017.   Specialties:  Internal Medicine, Oncology Why:  at 10:00 a.m. Contact information: [place] [address] [phone]             Discharge Instructions  and  Discharge Medications     Allergies as of 07/25/2017      Reactions   Codeine Anaphylaxis   Fish-derived Products Anaphylaxis   Morphine Sulfate Anaphylaxis   REACTION: anaphylaxis   Adhesive [tape]    PAPER TAPE OK TO USE   Oxycodone    Nausea and vomiting      Medication List    STOP taking these medications   LEVOFLOXACIN PO     TAKE these medications   acyclovir 400 MG tablet Commonly known as:  ZOVIRAX Take 1 tablet (400 mg total) by mouth 2 (two) times daily.   ALPRAZolam 0.5 MG tablet Commonly known as:  XANAX TAKE 1 TABLET BY MOUTH EVERY 8 HOURS AS NEEDED FOR ANXIETY OR SLEEP   amoxicillin-clavulanate 875-125 MG tablet Commonly known as:  AUGMENTIN Take 1 tablet by mouth  2 (two) times daily for 7 days.   COSAMIN DS PO Take 1 tablet by mouth daily. In the morning.   cyanocobalamin 1000 MCG/ML injection Commonly known as:  (VITAMIN B-12) Inject 1,000 mcg into the muscle every 30 (thirty) days.   ergocalciferol 50000 units capsule Commonly known as:  VITAMIN D2 Take 1 capsule (50,000 Units total) by mouth every Saturday. In the morning.   fexofenadine 180 MG tablet Commonly known as:  ALLEGRA Take 180 mg by mouth every morning.   fluconazole 200 MG tablet Commonly known as:  DIFLUCAN Take 1 tablet (200 mg total) by mouth daily.   letrozole 2.5 MG tablet Commonly known as:  FEMARA Take 1 tablet (2.5 mg total) by mouth daily. Once a day.   lidocaine-prilocaine cream Commonly known as:  EMLA Apply 1 application topically as needed. Apply generously over the Mediport 45 minutes prior to chemotherapy.   magic mouthwash w/lidocaine Soln Take 5 mLs by mouth 4 (four) times daily.   metoprolol succinate 25 MG 24 hr tablet Commonly known as:  TOPROL-XL Take 1 tablet (25 mg total) by mouth daily.   pravastatin 40 MG tablet Commonly known as:  PRAVACHOL Take 1 tablet (40 mg total) by mouth daily.   PROBIOTIC & ACIDOPHILUS EX ST PO Take 1 tablet by mouth daily.   prochlorperazine 10 MG tablet Commonly known as:  COMPAZINE Take 1 tablet (10 mg total) by mouth every 6 (six) hours as needed for nausea or vomiting.   ranitidine 75 MG tablet Commonly known as:  ZANTAC Take 75 mg by mouth daily as needed for heartburn.         Diet and Activity recommendation: See Discharge Instructions above   Consults obtained -oncology, vascular   Major procedures and Radiology Reports - PLEASE review detailed and final reports for all details, in brief -      Dg Chest 2 View  Result Date: 07/21/2017 CLINICAL DATA:  Fever today. EXAM: CHEST - 2 VIEW COMPARISON:  PET CT scan 06/03/2017. PA and lateral chest 03/15/2017. Single-view of the chest  04/13/2017. FINDINGS: Right Port-A-Cath is unchanged since the most recent exam. The patient is status post right axillary dissection. Lungs are clear. Heart size is normal. No pneumothorax or pleural fluid. Aortic atherosclerosis is identified. Convex left lumbar scoliosis and multilevel thoracic and lumbar degenerative disease are noted. IMPRESSION: No acute disease. Electronically Signed   By: Inge Rise M.D.   On: 07/21/2017 15:49   Ir Fluoro Guide Cv Line Right  Result Date: 07/25/2017 INDICATION: 72 year old with history of breast cancer and 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 Patient was admitted for neutropenic fever. Patient has a right jugular Port-A-Cath which is not aspirating. Patient presents for a port check. EXAM: PORT-A-CATH INJECTION UNDER FLUOROSCOPY MEDICATIONS: None ANESTHESIA/SEDATION: None FLUOROSCOPY TIME:  Fluoroscopy Time: 6 seconds, 45 mGy CONTRAST:  10 mL [id number] COMPLICATIONS: None immediate. PROCEDURE: The procedure was explained to the patient. The risks and benefits of the procedure were discussed and the patient's questions were addressed. Informed consent was obtained from the patient. The right chest port was already accessed. The port flushed easily but would not aspirate. The port was flushed with contrast under fluoroscopic evaluation. Despite normal appearance of the port under fluoroscopy, the port would not aspirate. Therefore, the existing needle was completely removed. A new access needle was placed with sterile technique. The port was able to aspirate and flush with placement of the new port needle. Fluoroscopic images were taken and saved for this procedure. FINDINGS: Right jugular Port-A-Cath. Catheter tip at the superior cavoatrial junction. No evidence for port discontinuity or contrast extravasation. Contrast empties into the right atrium without evidence of a fibrin sheath. IMPRESSION: Normal appearance of the right jugular Port-A-Cath. Port-A-Cath tip is  well positioned and no evidence for a fibrin sheath. The Port-A-Cath was aspirating normally after a new access needle was placed. Electronically Signed   By: Markus Daft M.D.   On: 07/25/2017 16:48    Micro Results    Recent Results (from the past 240 hour(s))  Culture, blood (Routine x 2)     Status: None   Collection Time: 07/21/17  3:32 PM  Result Value Ref Range Status   Specimen Description BLOOD L FA  Final   Special Requests   Final    BOTTLES DRAWN AEROBIC AND ANAEROBIC Blood Culture results may not be optimal due to an excessive volume of blood received in culture bottles   Culture   Final    NO GROWTH 5 DAYS Performed at [facility], [address], [place], [place] [address]    Report Status 07/26/2017 FINAL  Final  Culture, blood (Routine x 2)  Status: None   Collection Time: 07/21/17  3:32 PM  Result Value Ref Range Status   Specimen Description BLOOD L AC  Final   Special Requests   Final    BOTTLES DRAWN AEROBIC AND ANAEROBIC Blood Culture results may not be optimal due to an excessive volume of blood received in culture bottles   Culture   Final    NO GROWTH 5 DAYS Performed at [facility], [address], [place], [place] [address]    Report Status 07/26/2017 FINAL  Final  Urine culture     Status: None   Collection Time: 07/21/17  3:32 PM  Result Value Ref Range Status   Specimen Description   Final    URINE, RANDOM Performed at [facility], [address], [place], [place] [address]    Special Requests   Final    NONE Performed at [facility], [address] [place], [place] [address]    Culture   Final    NO GROWTH Performed at [facility], [place] [address] [place], [place] [address]    Report Status 07/23/2017 FINAL  Final  MRSA PCR Screening     Status: None   Collection Time: 07/21/17  8:53 PM  Result Value Ref Range Status   MRSA by PCR NEGATIVE NEGATIVE Final    Comment:        The  GeneXpert MRSA Assay (FDA approved for NASAL specimens only), is one component of a comprehensive MRSA colonization surveillance program. It is not intended to diagnose MRSA infection nor to guide or monitor treatment for MRSA infections. Performed at [facility], [address] [place], [place] [address]        Today   Subjective:   Shelly Arias today has no fever, shortness of breath.  Appetite poor, she only likes beans and rice.  Advised the patient to have good protein.  Objective:   Blood pressure (!) 145/68, pulse 85, temperature (!) 97.5 F (36.4 C), temperature source Oral, resp. rate 20, height 5\' 1"  (1.549 m), weight 60.3 kg (133 lb), SpO2 99 %.  No intake or output data in the 24 hours ending 07/29/17 1030  Exam Awake Alert, Oriented x 3, No new F.N deficits, Normal affect Rake.AT,PERRAL Supple Neck,No JVD, No cervical lymphadenopathy appriciated.  Symmetrical Chest wall movement, Good air movement bilaterally, CTAB RRR,No Gallops,Rubs or new Murmurs, No Parasternal Heave +ve B.Sounds, Abd Soft, Non tender, No organomegaly appriciated, No rebound -guarding or rigidity. No Cyanosis, Clubbing or edema, No new Rash or bruise  Data Review   CBC w Diff:  Lab Results  Component Value Date   WBC 0.6 (LL) 07/26/2017   HGB 9.7 (L) 07/26/2017   HGB 13.3 07/09/2014   HCT 27.2 (L) 07/26/2017   HCT 40.4 07/09/2014   PLT 12 (LL) 07/26/2017   PLT 213 07/09/2014   LYMPHOPCT 87 07/26/2017   LYMPHOPCT 26.5 07/09/2014   MONOPCT 3 07/26/2017   MONOPCT 6.6 07/09/2014   EOSPCT 0 07/26/2017   EOSPCT 1.0 07/09/2014   BASOPCT 0 07/26/2017   BASOPCT 0.5 07/09/2014    CMP:  Lab Results  Component Value Date   NA 131 (L) 07/22/2017   K 3.9 07/22/2017   CL 100 (L) 07/22/2017   CO2 24 07/22/2017   BUN 12 07/22/2017   CREATININE 0.74 07/22/2017   CREATININE 0.67 07/09/2014   CREATININE 0.70 04/30/2014   PROT 6.5 07/21/2017   PROT 7.3 07/09/2014  ALBUMIN 2.8 (L) 07/21/2017   ALBUMIN 4.3 07/09/2014   BILITOT 0.5 07/21/2017   BILITOT 0.6 07/09/2014   ALKPHOS 57 07/21/2017   ALKPHOS 66 07/09/2014   AST 18 07/21/2017   AST 19 07/09/2014   ALT 21 07/21/2017   ALT 18 07/09/2014  .   Total Time in preparing paper work, data evaluation and todays exam - 35 minutes  Epifanio Lesches M.D on 07/25/2017 at 10:30 AM    Note: This dictation was prepared with Dragon dictation along with smaller phrase technology. Any transcriptional errors that result from this process are unintentional.

## 2017-07-29 NOTE — Telephone Encounter (Signed)
Transition Care Management Follow-up Telephone Call  How have you been since you were released from the hospital? Patient seen at Cancer center today received Platlets   Do you understand why you were in the hospital? yes   Do you understand the discharge instrcutions? yes  Items Reviewed:  Medications reviewed: yes  Allergies reviewed: yes  Dietary changes reviewed: yes  Referrals reviewed: yes   Functional Questionnaire:   Activities of Daily Living (ADLs):   She states they are independent in the following: ambulation, bathing and hygiene, feeding, continence, grooming, toileting and dressing States they require assistance with the following: No assistance neeeded at this tiem.   Any transportation issues/concerns?: no   Any patient concerns? no   Confirmed importance and date/time of follow-up visits scheduled: yes   Confirmed with patient if condition begins to worsen call PCP or go to the ER.  Patient was given the Call-a-Nurse line [phone]: yes

## 2017-07-30 ENCOUNTER — Inpatient Hospital Stay: Payer: Medicare Other

## 2017-07-30 ENCOUNTER — Ambulatory Visit (INDEPENDENT_AMBULATORY_CARE_PROVIDER_SITE_OTHER): Payer: Medicare Other | Admitting: Family Medicine

## 2017-07-30 ENCOUNTER — Encounter: Payer: Self-pay | Admitting: Family Medicine

## 2017-07-30 VITALS — BP 144/78 | HR 120 | Temp 97.9°F | Wt 134.0 lb

## 2017-07-30 DIAGNOSIS — Z170 Estrogen receptor positive status [ER+]: Secondary | ICD-10-CM | POA: Diagnosis not present

## 2017-07-30 DIAGNOSIS — D469 Myelodysplastic syndrome, unspecified: Secondary | ICD-10-CM

## 2017-07-30 DIAGNOSIS — C50811 Malignant neoplasm of overlapping sites of right female breast: Secondary | ICD-10-CM

## 2017-07-30 DIAGNOSIS — R509 Fever, unspecified: Secondary | ICD-10-CM

## 2017-07-30 DIAGNOSIS — C7800 Secondary malignant neoplasm of unspecified lung: Secondary | ICD-10-CM | POA: Diagnosis not present

## 2017-07-30 DIAGNOSIS — R000 Tachycardia, unspecified: Secondary | ICD-10-CM | POA: Diagnosis not present

## 2017-07-30 DIAGNOSIS — D649 Anemia, unspecified: Secondary | ICD-10-CM | POA: Diagnosis not present

## 2017-07-30 DIAGNOSIS — C787 Secondary malignant neoplasm of liver and intrahepatic bile duct: Secondary | ICD-10-CM | POA: Diagnosis not present

## 2017-07-30 DIAGNOSIS — D693 Immune thrombocytopenic purpura: Secondary | ICD-10-CM

## 2017-07-30 LAB — PREPARE PLATELET PHERESIS: Unit division: 0

## 2017-07-30 LAB — BPAM PLATELET PHERESIS
BLOOD PRODUCT EXPIRATION DATE: 201905201430
ISSUE DATE / TIME: 201905201423
Unit Type and Rh: 5100

## 2017-07-30 MED ORDER — TBO-FILGRASTIM 480 MCG/0.8ML ~~LOC~~ SOSY
480.0000 ug | PREFILLED_SYRINGE | Freq: Once | SUBCUTANEOUS | Status: AC
  Administered 2017-07-30: 480 ug via SUBCUTANEOUS

## 2017-07-30 MED ORDER — ALPRAZOLAM 0.5 MG PO TABS: 0.5000 mg | ORAL_TABLET | Freq: Three times a day (TID) | ORAL | 2 refills | Status: DC | PRN

## 2017-07-30 NOTE — Assessment & Plan Note (Signed)
I suspect this is related to a combination of her anemia as well as poor p.o. intake.  She does not have any fevers and feels well.  She has no chest pain to indicate any ischemic event.  EKG appears stable compared to ED EKG.  We will have cardiology review and get their input.  Patient may need to see cardiology though some of that will depend on what her goals of care are moving forward.  I discussed that with the patient.

## 2017-07-30 NOTE — Patient Instructions (Signed)
Nice to see you. Please keep your appointment with hematology tomorrow. We will contact you when we hear back from cardiology regarding your EKG. If you develop chest pain or fevers please go to the emergency room.

## 2017-07-30 NOTE — Assessment & Plan Note (Signed)
Patient continues to follow with oncology for this.  CBC yesterday with thrombocytopenia, leukopenia, and anemia.  She received platelets yesterday.  She will see oncology later this week.  Given return precautions.

## 2017-07-30 NOTE — Progress Notes (Signed)
Tommi Rumps, MD Phone: [phone]  Shelly Arias is a 72 y.o. female who presents today for hospital follow-up.  CC: hospital follow-up.  Presents for hospital follow-up.  She was hospitalized from 07/21/2017-07/25/2017 for neutropenic fever and sepsis.  She was started on vancomycin and cefepime.  Blood cultures and urine cultures were negative.  Oncology followed with her and discharged her after she had been afebrile for 24 hours.  She has been on Augmentin since discharge.  She also received 2 units of blood during the hospitalization.  She has received platelets as an outpatient through hematology.  Last received those yesterday.  She received prednisone yesterday.  She reports no fevers over the last couple of days.  She feels well today after receiving prednisone yesterday.  She just did not have much energy previously.  She follows with oncology tomorrow. Discharge summary and medications reviewed.  She reports her heart rate has been up recently.  It was 130 in December when she was at [facility].  It appears to have been 125 in the ED prior to her recent admission.  She notes she is not eating or drinking very well.  EKG in the ED appeared to have ST segment elevations and peaked T waves.  Troponin was negative.  The ED physician did not feel that she was having an acute MI.  She had no chest pain.  She notes no chest pain since then.  No prior cardiology evaluation.  Social History   Tobacco Use  Smoking Status Current Some Day Smoker  . Packs/day: 0.10  . Years: 30.00  . Pack years: 3.00  . Types: Cigarettes  Smokeless Tobacco Never Used     ROS see history of present illness  Objective  Physical Exam Vitals:   07/30/17 1128  BP: (!) 144/78  Pulse: (!) 120  Temp: 97.9 F (36.6 C)  SpO2: 98%    BP Readings from Last 3 Encounters:  07/30/17 (!) 144/78  07/29/17 112/66  07/25/17 (!) 145/68   Wt Readings from Last 3 Encounters:  07/30/17 134 lb (60.8 kg)    07/21/17 133 lb (60.3 kg)  07/08/17 135 lb 3.2 oz (61.3 kg)    Physical Exam  Constitutional: No distress.  Cardiovascular: Regular rhythm and normal heart sounds. Tachycardia present.  Pulmonary/Chest: Effort normal and breath sounds normal.  Musculoskeletal: She exhibits no edema.  Neurological: She is alert.  Skin: Skin is warm and dry. She is not diaphoretic.   EKG: Sinus tachycardia, rate 116, elevated ST segment V1, V2, and V3 appears similar to ED EKG, slight change in QRS morphology in leads III and aVF though QRS duration appears relatively similar, no acute ischemic changes  Assessment/Plan: Please see individual problem list.  Fever No recurrence.  She is feeling better.  She will keep her appointment with oncology later this week.  Given return precautions.  MDS (myelodysplastic syndrome) ([place]) Patient continues to follow with oncology for this.  CBC yesterday with thrombocytopenia, leukopenia, and anemia.  She received platelets yesterday.  She will see oncology later this week.  Given return precautions.  Tachycardia I suspect this is related to a combination of her anemia as well as poor p.o. intake.  She does not have any fevers and feels well.  She has no chest pain to indicate any ischemic event.  EKG appears stable compared to ED EKG.  We will have cardiology review and get their input.  Patient may need to see cardiology though some of that will  depend on what her goals of care are moving forward.  I discussed that with the patient.   Orders Placed This Encounter  Procedures  . EKG 12-Lead    No orders of the defined types were placed in this encounter.    Tommi Rumps, MD [place]

## 2017-07-30 NOTE — Assessment & Plan Note (Addendum)
No recurrence.  She is feeling better.  She will keep her appointment with oncology later this week.  Given return precautions.

## 2017-07-31 ENCOUNTER — Inpatient Hospital Stay: Payer: Medicare Other

## 2017-07-31 ENCOUNTER — Other Ambulatory Visit: Payer: Self-pay | Admitting: Internal Medicine

## 2017-07-31 ENCOUNTER — Telehealth: Payer: Self-pay | Admitting: Family Medicine

## 2017-07-31 DIAGNOSIS — Z170 Estrogen receptor positive status [ER+]: Secondary | ICD-10-CM

## 2017-07-31 DIAGNOSIS — C50811 Malignant neoplasm of overlapping sites of right female breast: Secondary | ICD-10-CM | POA: Diagnosis not present

## 2017-07-31 DIAGNOSIS — C7800 Secondary malignant neoplasm of unspecified lung: Secondary | ICD-10-CM | POA: Diagnosis not present

## 2017-07-31 DIAGNOSIS — D696 Thrombocytopenia, unspecified: Secondary | ICD-10-CM

## 2017-07-31 DIAGNOSIS — D649 Anemia, unspecified: Secondary | ICD-10-CM | POA: Diagnosis not present

## 2017-07-31 DIAGNOSIS — D469 Myelodysplastic syndrome, unspecified: Secondary | ICD-10-CM | POA: Diagnosis not present

## 2017-07-31 DIAGNOSIS — C787 Secondary malignant neoplasm of liver and intrahepatic bile duct: Secondary | ICD-10-CM | POA: Diagnosis not present

## 2017-07-31 DIAGNOSIS — D693 Immune thrombocytopenic purpura: Secondary | ICD-10-CM

## 2017-07-31 MED ORDER — TBO-FILGRASTIM 480 MCG/0.8ML ~~LOC~~ SOSY
480.0000 ug | PREFILLED_SYRINGE | Freq: Once | SUBCUTANEOUS | Status: AC
  Administered 2017-07-31: 480 ug via SUBCUTANEOUS

## 2017-07-31 NOTE — Telephone Encounter (Signed)
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 I can not exclude a previous anterior infarct. I recommend an echocardiogram to evaluate this.

## 2017-07-31 NOTE — Telephone Encounter (Signed)
Please let the patient know I heard back from cardiology regarding her EKG.  They noted the abnormal findings that I noted and recommended that she have an echo to start with.  If she is willing I can place an order for this and we can evaluate.  Thanks.

## 2017-08-01 ENCOUNTER — Telehealth: Payer: Self-pay | Admitting: *Deleted

## 2017-08-01 ENCOUNTER — Inpatient Hospital Stay: Payer: Medicare Other

## 2017-08-01 ENCOUNTER — Other Ambulatory Visit: Payer: Self-pay | Admitting: *Deleted

## 2017-08-01 ENCOUNTER — Inpatient Hospital Stay (HOSPITAL_BASED_OUTPATIENT_CLINIC_OR_DEPARTMENT_OTHER): Payer: Medicare Other | Admitting: Internal Medicine

## 2017-08-01 ENCOUNTER — Encounter: Payer: Self-pay | Admitting: Internal Medicine

## 2017-08-01 VITALS — BP 150/80 | HR 90 | Temp 97.2°F | Resp 20

## 2017-08-01 VITALS — BP 140/79 | HR 113 | Temp 98.0°F | Resp 18 | Ht 61.0 in | Wt 130.8 lb

## 2017-08-01 DIAGNOSIS — Z7689 Persons encountering health services in other specified circumstances: Secondary | ICD-10-CM | POA: Diagnosis not present

## 2017-08-01 DIAGNOSIS — I10 Essential (primary) hypertension: Secondary | ICD-10-CM

## 2017-08-01 DIAGNOSIS — D696 Thrombocytopenia, unspecified: Secondary | ICD-10-CM

## 2017-08-01 DIAGNOSIS — R5383 Other fatigue: Secondary | ICD-10-CM | POA: Diagnosis not present

## 2017-08-01 DIAGNOSIS — K219 Gastro-esophageal reflux disease without esophagitis: Secondary | ICD-10-CM

## 2017-08-01 DIAGNOSIS — Z170 Estrogen receptor positive status [ER+]: Secondary | ICD-10-CM

## 2017-08-01 DIAGNOSIS — D701 Agranulocytosis secondary to cancer chemotherapy: Secondary | ICD-10-CM

## 2017-08-01 DIAGNOSIS — D649 Anemia, unspecified: Secondary | ICD-10-CM

## 2017-08-01 DIAGNOSIS — M810 Age-related osteoporosis without current pathological fracture: Secondary | ICD-10-CM

## 2017-08-01 DIAGNOSIS — E46 Unspecified protein-calorie malnutrition: Secondary | ICD-10-CM

## 2017-08-01 DIAGNOSIS — C7800 Secondary malignant neoplasm of unspecified lung: Secondary | ICD-10-CM

## 2017-08-01 DIAGNOSIS — C50811 Malignant neoplasm of overlapping sites of right female breast: Secondary | ICD-10-CM

## 2017-08-01 DIAGNOSIS — K5900 Constipation, unspecified: Secondary | ICD-10-CM

## 2017-08-01 DIAGNOSIS — Z9221 Personal history of antineoplastic chemotherapy: Secondary | ICD-10-CM

## 2017-08-01 DIAGNOSIS — Z923 Personal history of irradiation: Secondary | ICD-10-CM

## 2017-08-01 DIAGNOSIS — C787 Secondary malignant neoplasm of liver and intrahepatic bile duct: Secondary | ICD-10-CM | POA: Diagnosis not present

## 2017-08-01 DIAGNOSIS — Z85828 Personal history of other malignant neoplasm of skin: Secondary | ICD-10-CM

## 2017-08-01 DIAGNOSIS — Z79899 Other long term (current) drug therapy: Secondary | ICD-10-CM

## 2017-08-01 DIAGNOSIS — R0602 Shortness of breath: Secondary | ICD-10-CM

## 2017-08-01 DIAGNOSIS — D469 Myelodysplastic syndrome, unspecified: Secondary | ICD-10-CM

## 2017-08-01 DIAGNOSIS — Z8614 Personal history of Methicillin resistant Staphylococcus aureus infection: Secondary | ICD-10-CM

## 2017-08-01 DIAGNOSIS — D693 Immune thrombocytopenic purpura: Secondary | ICD-10-CM

## 2017-08-01 DIAGNOSIS — R51 Headache: Secondary | ICD-10-CM

## 2017-08-01 DIAGNOSIS — M1990 Unspecified osteoarthritis, unspecified site: Secondary | ICD-10-CM

## 2017-08-01 DIAGNOSIS — J449 Chronic obstructive pulmonary disease, unspecified: Secondary | ICD-10-CM

## 2017-08-01 DIAGNOSIS — E785 Hyperlipidemia, unspecified: Secondary | ICD-10-CM

## 2017-08-01 DIAGNOSIS — R630 Anorexia: Secondary | ICD-10-CM | POA: Diagnosis not present

## 2017-08-01 DIAGNOSIS — Z803 Family history of malignant neoplasm of breast: Secondary | ICD-10-CM

## 2017-08-01 DIAGNOSIS — F17210 Nicotine dependence, cigarettes, uncomplicated: Secondary | ICD-10-CM

## 2017-08-01 LAB — SAMPLE TO BLOOD BANK

## 2017-08-01 LAB — CBC WITH DIFFERENTIAL/PLATELET
Basophils Absolute: 0 10*3/uL (ref 0–0.1)
Basophils Relative: 0 %
EOS ABS: 0 10*3/uL (ref 0–0.7)
EOS PCT: 0 %
HCT: 22.7 % — ABNORMAL LOW (ref 35.0–47.0)
Hemoglobin: 8.1 g/dL — ABNORMAL LOW (ref 12.0–16.0)
LYMPHS ABS: 0.9 10*3/uL — AB (ref 1.0–3.6)
LYMPHS PCT: 83 %
MCH: 30.8 pg (ref 26.0–34.0)
MCHC: 35.7 g/dL (ref 32.0–36.0)
MCV: 86.3 fL (ref 80.0–100.0)
MONO ABS: 0 10*3/uL — AB (ref 0.2–0.9)
Monocytes Relative: 4 %
Neutro Abs: 0.1 10*3/uL — ABNORMAL LOW (ref 1.4–6.5)
Neutrophils Relative %: 13 %
PLATELETS: 17 10*3/uL — AB (ref 150–400)
RBC: 2.63 MIL/uL — AB (ref 3.80–5.20)
RDW: 13.9 % (ref 11.5–14.5)
WBC: 1.1 10*3/uL — AB (ref 3.6–11.0)

## 2017-08-01 LAB — PREPARE RBC (CROSSMATCH)

## 2017-08-01 MED ORDER — HEPARIN SOD (PORK) LOCK FLUSH 100 UNIT/ML IV SOLN: 500.0000 [IU] | Freq: Every day | INTRAVENOUS | Status: DC | PRN

## 2017-08-01 MED ORDER — HEPARIN SOD (PORK) LOCK FLUSH 100 UNIT/ML IV SOLN
500.0000 [IU] | Freq: Once | INTRAVENOUS | Status: AC
  Administered 2017-08-01: 500 [IU] via INTRAVENOUS
  Filled 2017-08-01: qty 5

## 2017-08-01 MED ORDER — TBO-FILGRASTIM 480 MCG/0.8ML ~~LOC~~ SOSY
480.0000 ug | PREFILLED_SYRINGE | Freq: Once | SUBCUTANEOUS | Status: AC
  Administered 2017-08-01: 480 ug via SUBCUTANEOUS

## 2017-08-01 MED ORDER — ACETAMINOPHEN 325 MG PO TABS
650.0000 mg | ORAL_TABLET | Freq: Once | ORAL | Status: AC
  Administered 2017-08-01: 650 mg via ORAL
  Filled 2017-08-01: qty 2

## 2017-08-01 MED ORDER — SODIUM CHLORIDE 0.9% FLUSH
10.0000 mL | Freq: Once | INTRAVENOUS | Status: AC
  Administered 2017-08-01: 10 mL via INTRAVENOUS
  Filled 2017-08-01: qty 10

## 2017-08-01 MED ORDER — SODIUM CHLORIDE 0.9 % IV SOLN
250.0000 mL | Freq: Once | INTRAVENOUS | Status: AC
  Administered 2017-08-01: 250 mL via INTRAVENOUS
  Filled 2017-08-01: qty 250

## 2017-08-01 MED ORDER — DIPHENHYDRAMINE HCL 25 MG PO CAPS
25.0000 mg | ORAL_CAPSULE | Freq: Once | ORAL | Status: AC
  Administered 2017-08-01: 25 mg via ORAL
  Filled 2017-08-01: qty 1

## 2017-08-01 NOTE — Telephone Encounter (Signed)
Patient notified and states she will call back and let us know about her schedule since she goes to the cancer center everyday

## 2017-08-01 NOTE — Progress Notes (Signed)
Collene Mares from [facility] called Critical ANC - PLT 17  ANC 0.1; HGB 8.1. Read back process performed with tech. Dr. Rogue Bussing informed of critical value.

## 2017-08-01 NOTE — Progress Notes (Signed)
[place] OFFICE PROGRESS NOTE  Patient Care Team: [name], MD as PCP - General (Family Medicine) [name], DPM as Consulting Physician (Podiatry) [name], MD as Consulting Physician (Internal Medicine) Bary Castilla Forest Gleason, MD (General Surgery)  Cancer Staging No matching staging information was found for the patient.   Oncology History   # April 2018- Left chest wall Bx- IMC; ER-PR-POS; her 2 Neu NEG; PET- mild hilar/distal adenopathy; ~1 cm right lung nodule/ several sub-centimeter.   # May 2018- cont [facility]; Abemacliclib [on HOLD sec to cytopenia]; MARCH 2019- PET near CR from breast cancer  # April 2017-isolated Thrombocytopenia- platelets- 113; worsening PANCYTOPENIA- April 2018- BMBx- no evidence of malignancy; MDS/Acute leuk; Karyotype/MDS- FISH panel-NEG [d/w Dr.Smir];   # June 2018- REPEAT BMBx/[facility] [June 2018-Dr.Foster; Aug 2018- Dr.Powell at [facility]]; No Diagnosis.   # April 11, 2017 [third bone marrow]-is still inconclusive; no obvious evidence of acute leukemia or obvious MDS except for mild dysplastic changes; and significantly reduced megakaryocytes. 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 [reviwed at [facility]; [facility]-NEG]  # April 1st 2019- Dacogen   # May 29th 2018- N-plate- suboptimal improvement; NOV 1st week start promacta 50 mg/day; suboptimal response; DEC 1st- start promacta 75 mg/day  # DEC 18th-INCREASE PROMACTA to 123m/day  # 2006- RIGHT BREAST CA [[id number]; STAGE I; ER/PRPos; Her 2 Neu-NEG] s/p FEC x 6 [NSABP B-36]; Femara [Dec 2007-2012]  # Osteoporosis s/p Reclast; BMD- 2015-wnl- ca+vit D   Dr.Stewart [derm ? Squamous cell s/p freeze-oct 2018] --------------------------------------------------------------  DIAGNOSIS: [Shelly Kluver2017 ] # ? MDS /Meta.Breast cancer- ER/PR pos; her-NEG  STAGE:  IV  ;GOALS:  palliative  CURRENT/MOST RECENT THERAPY: dacogen [April 2019]; Letrozole [May 2017];   # Pt needs PLATELETS- WASHED/ CROSS-MATCHED PLATELETS        Carcinoma of overlapping sites of right breast in female, estrogen receptor positive ([place]    MDS (myelodysplastic syndrome) ([place]      INTERVAL HISTORY:  Shelly [id number].[id number].  female pleasant patient above history of likely MDS/metastatic breast cancer on Femara-is here for follow-up.  Patient continues to complain of worsening fatigue.  Complains of poor appetite.  Weight loss.  Complains of shortness of breath with exertion.  In the interim patient was diagnosed with possible left bundle branch block; has been recommended cardiology evaluation by PCP.   Review of Systems  Constitutional: Positive for malaise/fatigue and weight loss. Negative for chills, diaphoresis and fever.  HENT: Negative for nosebleeds and sore throat.   Eyes: Negative for double vision.  Respiratory: Positive for shortness of breath. Negative for cough, hemoptysis, sputum production and wheezing.   Cardiovascular: Negative for chest pain, palpitations, orthopnea and leg swelling.  Gastrointestinal: Negative for abdominal pain, blood in stool, constipation, diarrhea, heartburn, melena, nausea and vomiting.  Genitourinary: Negative for dysuria, frequency and urgency.  Musculoskeletal: Negative for back pain and joint pain.  Skin: Negative.  Negative for itching and rash.  Neurological: Negative for dizziness, tingling, focal weakness, weakness and headaches.  Endo/Heme/Allergies: Does not bruise/bleed easily.  Psychiatric/Behavioral: Negative for depression. The patient is not nervous/anxious and does not have insomnia.       PAST MEDICAL HISTORY :  Past Medical History:  Diagnosis Date  . Blood dyscrasia   . Breast cancer (Shelly Arias    right, lumpectomy, radiation, chemo  . Breast cancer (Shelly Arias    Right, 2007  . Breast cancer (Shelly Arias 06/20/2016  INVASIVE DUCTAL CARCINOMA.  . Collapsed lung 02/14/2017   RIGHT  . Complication of anesthesia    PT STATED AFTER BIL HIP SURGERIES SHE STOPPED BREATHING THE NIGHT AFTER SURGERY  . COPD (chronic obstructive pulmonary disease) ([place])   . GERD (gastroesophageal reflux disease)   . Headache   . History of chemotherapy   . History of methicillin resistant staphylococcus aureus (MRSA) 2011  . History of radiation therapy   . Hyperlipidemia   . Hypertension   . Liver cancer (Shelly Arias) 05/2016  . Lung cancer (Shelly Arias) 05/2016  . Osteoarthritis    right hip  . Osteoporosis   . Squamous cell carcinoma    leg, Followed by Dr. Nicole Kindred    PAST SURGICAL HISTORY :   Past Surgical History:  Procedure Laterality Date  . ABDOMINAL HYSTERECTOMY    . BREAST BIOPSY Left 2002   left breast, calcifications  . BREAST BIOPSY Left 06/20/2016   INVASIVE DUCTAL CARCINOMA.  Marland Kitchen BREAST EXCISIONAL BIOPSY Right 2007   positive  . bunion repair    . ESOPHAGOGASTRODUODENOSCOPY (EGD) WITH PROPOFOL N/A 08/05/2016   Procedure: ESOPHAGOGASTRODUODENOSCOPY (EGD) WITH PROPOFOL;  Surgeon: San Jetty, MD;  Location: [facility] ENDOSCOPY;  Service: General;  Laterality: N/A;  . IR FLUORO GUIDE CV LINE RIGHT  07/25/2017  . JOINT REPLACEMENT    . left breast biopsy    . NASAL SINUS SURGERY    . PORTACATH PLACEMENT Right 04/13/2017   Procedure: INSERTION PORT-A-CATH- RIGHT INTERNAL JUGULAR;  Surgeon: Robert Bellow, MD;  Location: [facility] ORS;  Service: General;  Laterality: Right;  . right hip replacement    . SHOULDER SURGERY    . SQUAMOUS CELL CARCINOMA EXCISION     right leg, Dr. Nicole Kindred  . TOTAL HIP ARTHROPLASTY     right    FAMILY HISTORY :   Family History  Problem Relation Age of Onset  . Heart disease Father 53  . Heart disease Mother   . Hyperlipidemia Mother   . Heart disease Brother   . Diabetes Brother   . Diabetes Maternal Grandmother   . Breast cancer Cousin   . Kidney disease Brother     SOCIAL HISTORY:    Social History   Tobacco Use  . Smoking status: Current Some Day Smoker    Packs/day: 0.10    Years: 30.00    Pack years: 3.00    Types: Cigarettes  . Smokeless tobacco: Never Used  Substance Use Topics  . Alcohol use: Yes    Alcohol/week: 0.0 oz    Comment: socially  . Drug use: No    ALLERGIES:  is allergic to codeine; fish-derived products; morphine sulfate; adhesive [tape]; and oxycodone.  MEDICATIONS:  Current Outpatient Medications  Medication Sig Dispense Refill  . acyclovir (ZOVIRAX) 400 MG tablet Take 1 tablet (400 mg total) by mouth 2 (two) times daily. 60 tablet 3  . ALPRAZolam (XANAX) 0.5 MG tablet Take 1 tablet (0.5 mg total) by mouth every 8 (eight) hours as needed for anxiety. 30 tablet 2  . cyanocobalamin (,VITAMIN B-12,) 1000 MCG/ML injection Inject 1,000 mcg into the muscle every 30 (thirty) days.    . ergocalciferol (VITAMIN D2) 50000 units capsule Take 1 capsule (50,000 Units total) by mouth every Saturday. In the morning. 12 capsule 0  . fexofenadine (ALLEGRA) 180 MG tablet Take 180 mg by mouth every morning.     . fluconazole (DIFLUCAN) 200 MG tablet Take 1 tablet (200 mg total) by mouth daily. 30 tablet 3  .  Glucosamine-Chondroitin (COSAMIN DS PO) Take 1 tablet by mouth daily. In the morning.    Marland Kitchen letrozole (FEMARA) 2.5 MG tablet Take 1 tablet (2.5 mg total) by mouth daily. Once a day. 90 tablet 0  . metoprolol succinate (TOPROL-XL) 25 MG 24 hr tablet Take 1 tablet (25 mg total) by mouth daily. 90 tablet 3  . pravastatin (PRAVACHOL) 40 MG tablet Take 1 tablet (40 mg total) by mouth daily. 90 tablet 3  . Probiotic Product (PROBIOTIC & ACIDOPHILUS EX ST PO) Take 1 tablet by mouth daily.     . prochlorperazine (COMPAZINE) 10 MG tablet Take 1 tablet (10 mg total) by mouth every 6 (six) hours as needed for nausea or vomiting. 30 tablet 2  . ranitidine (ZANTAC) 75 MG tablet Take 75 mg by mouth daily as needed for heartburn.     Marland Kitchen amoxicillin-clavulanate  (AUGMENTIN) 875-125 MG tablet Take 1 tablet by mouth 2 (two) times daily. 20 tablet 0  . dronabinol (MARINOL) 2.5 MG capsule Take 1 capsule (2.5 mg total) by mouth 2 (two) times daily before a meal. 30 capsule 0  . levofloxacin (LEVAQUIN) 500 MG tablet Take 500 mg by mouth daily.    . tranexamic acid (LYSTEDA) 650 MG TABS tablet Take 2 tablets (1,300 mg total) by mouth 3 (three) times daily. 45 tablet 2   No current facility-administered medications for this visit.    Facility-Administered Medications Ordered in Other Visits  Medication Dose Route Frequency Provider Last Rate Last Dose  . 0.9 %  sodium chloride infusion   Intravenous Continuous [name], MD   Stopped at 06/28/17 1450  . 0.9 %  sodium chloride infusion   Intravenous Continuous [name], MD   Stopped at 07/29/17 1515  . heparin lock flush 100 unit/mL  500 Units Intravenous Once Charlaine Dalton R, MD      . sodium chloride flush (NS) 0.9 % injection 10 mL  10 mL Intravenous PRN Charlaine Dalton R, MD      . sodium chloride flush (NS) 0.9 % injection 10 mL  10 mL Intravenous PRN Verlon Au, NP      . sodium chloride flush (NS) 0.9 % injection 10 mL  10 mL Intravenous PRN [name], MD   10 mL at 07/29/17 1405    PHYSICAL EXAMINATION: ECOG PERFORMANCE STATUS: 1 - Symptomatic but completely ambulatory  BP 140/79 (Patient Position: Sitting)   Pulse (!) 113   Temp 98 F (36.7 C)   Resp 18   Ht _0  (1.549 m)   Wt 130 lb 12.8 oz (59.3 kg)   SpO2 97%   BMI 24.71 kg/m   Filed Weights   08/01/17 0852  Weight: 130 lb 12.8 oz (59.3 kg)    GENERAL: Well-nourished well-developed; Alert, no distress and comfortable.  Accompanied by family.  EYES: Positive for pallor OROPHARYNX: no thrush or ulceration; NECK: supple; no lymph nodes felt. LYMPH:  no palpable lymphadenopathy in the axillary or inguinal regions LUNGS: Decreased breath sounds auscultation bilaterally. No wheeze or  crackles HEART/CVS: regular rate & rhythm and no murmurs; No lower extremity edema ABDOMEN:abdomen soft, non-tender and normal bowel sounds. No hepatomegaly or splenomegaly.  Musculoskeletal:no cyanosis of digits and no clubbing  PSYCH: alert & oriented x 3 with fluent speech NEURO: no focal motor/sensory deficits SKIN:  no rashes or significant lesions    LABORATORY DATA:  I have reviewed the data as listed    Component Value Date/Time  NA 134 (L) 08/07/2017 0327   K 3.8 08/07/2017 0327   CL 99 (L) 08/07/2017 0327   CO2 25 08/07/2017 0327   GLUCOSE 203 (H) 08/07/2017 0327   BUN 16 08/07/2017 0327   CREATININE 0.69 08/07/2017 0327   CREATININE 0.67 07/09/2014 1321   CREATININE 0.70 04/30/2014 1455   CALCIUM 8.0 (L) 08/07/2017 0327   PROT 5.8 (L) 08/05/2017 1052   PROT 7.3 07/09/2014 1321   ALBUMIN 2.4 (L) 08/05/2017 1052   ALBUMIN 4.3 07/09/2014 1321   AST 21 08/05/2017 1052   AST 19 07/09/2014 1321   ALT 24 08/05/2017 1052   ALT 18 07/09/2014 1321   ALKPHOS 51 08/05/2017 1052   ALKPHOS 66 07/09/2014 1321   BILITOT 0.6 08/05/2017 1052   BILITOT 0.6 07/09/2014 1321   GFRNONAA >60 08/07/2017 0327   GFRNONAA >60 07/09/2014 1321   GFRAA >60 08/07/2017 0327   GFRAA >60 07/09/2014 1321    No results found for: SPEP, UPEP  Lab Results  Component Value Date   WBC 0.8 (LL) 08/07/2017   NEUTROABS 0.1 (L) 08/01/2017   HGB 9.3 (L) 08/07/2017   HCT 26.4 (L) 08/07/2017   MCV 88.3 08/07/2017   PLT 16 (LL) 08/07/2017      Chemistry      Component Value Date/Time   NA 134 (L) 08/07/2017 0327   K 3.8 08/07/2017 0327   CL 99 (L) 08/07/2017 0327   CO2 25 08/07/2017 0327   BUN 16 08/07/2017 0327   CREATININE 0.69 08/07/2017 0327   CREATININE 0.67 07/09/2014 1321   CREATININE 0.70 04/30/2014 1455      Component Value Date/Time   CALCIUM 8.0 (L) 08/07/2017 0327   ALKPHOS 51 08/05/2017 1052   ALKPHOS 66 07/09/2014 1321   AST 21 08/05/2017 1052   AST 19 07/09/2014 1321    ALT 24 08/05/2017 1052   ALT 18 07/09/2014 1321   BILITOT 0.6 08/05/2017 1052   BILITOT 0.6 07/09/2014 1321       RADIOGRAPHIC STUDIES: I have personally reviewed the radiological images as listed and agreed with the findings in the report. No results found.   ASSESSMENT & PLAN:  MDS (myelodysplastic syndrome) (HCC) #Highly suspicious MDS-high-grade; currently status post Dacogen [April 1].  Currently on hold because of worsening anemia thrombocytopenia neutropenia.  #Discussed the patient/family that-prolonged cytopenias could be from underlying MDS vs toxicity of Dacogen.  Repeating a bone marrow to help [place] tease this out.  Patient not keen on to continue Dacogen at this time.  #Continue to hold Dacogen; continue transfusion support-hemoglobin 8.1 however with upcoming holidays patient concerned about dropping of the hemoglobin; we will plan PRBC transfusion as patient symptomatic.  Also plan platelet transfusion early next week.  Continue Granix-for severe neutropenia.  # Anti-biotic prophylaxis-given severe neutropenia-recommend levofloxacin 500 milligrams a day/ acyclovir/dilfucan.  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 On Femara.  Improved.   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  Patient not too keen on further work-up including 2D echo or cardiology evaluation given her multiple problems including overall poor prognosis.  #Follow up transfusions next week.    Orders Placed This Encounter  Procedures  . CBC with Differential/Platelet    Standing Status:   Standing    Number of Occurrences:   10    Standing Expiration Date:   08/02/2018  . Sample to Blood Bank    Standing Status:   Standing    Number  of Occurrences:   30    Standing Expiration Date:   08/02/2018   All questions were answered. The patient knows to call the clinic with any problems, questions or concerns.      [name], MD 08/08/2017  7:51 AM

## 2017-08-01 NOTE — Assessment & Plan Note (Addendum)
#  Highly suspicious MDS-high-grade; currently status post Dacogen [April 1].  Currently on hold because of worsening anemia thrombocytopenia neutropenia.  #Discussed the patient/family that-prolonged cytopenias could be from underlying MDS vs toxicity of Dacogen.  Repeating a bone marrow to help [place] tease this out.  Patient not keen on to continue Dacogen at this time.  #Continue to hold Dacogen; continue transfusion support-hemoglobin 8.1 however with upcoming holidays patient concerned about dropping of the hemoglobin; we will plan PRBC transfusion as patient symptomatic.  Also plan platelet transfusion early next week.  Continue Granix-for severe neutropenia.  # Anti-biotic prophylaxis-given severe neutropenia-recommend levofloxacin 500 milligrams a day/ acyclovir/dilfucan.  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 On Femara.  Improved.   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  Patient not too keen on further work-up including 2D echo or cardiology evaluation given her multiple problems including overall poor prognosis.  #Follow up transfusions next week.

## 2017-08-01 NOTE — Telephone Encounter (Signed)
Patient inquired if she can have a b12 injection added to her encounter tomorrow. Last inj on 06/18/17 - I spoke with DR. B- md approved adding b12 to pt's sch.

## 2017-08-02 ENCOUNTER — Inpatient Hospital Stay: Payer: Medicare Other

## 2017-08-02 ENCOUNTER — Inpatient Hospital Stay (HOSPITAL_BASED_OUTPATIENT_CLINIC_OR_DEPARTMENT_OTHER): Payer: Medicare Other | Admitting: Oncology

## 2017-08-02 VITALS — BP 158/84 | HR 129 | Temp 100.3°F | Resp 20

## 2017-08-02 VITALS — BP 136/85 | HR 128 | Temp 99.4°F

## 2017-08-02 DIAGNOSIS — C50811 Malignant neoplasm of overlapping sites of right female breast: Secondary | ICD-10-CM | POA: Diagnosis not present

## 2017-08-02 DIAGNOSIS — G893 Neoplasm related pain (acute) (chronic): Secondary | ICD-10-CM

## 2017-08-02 DIAGNOSIS — C7800 Secondary malignant neoplasm of unspecified lung: Secondary | ICD-10-CM

## 2017-08-02 DIAGNOSIS — F17210 Nicotine dependence, cigarettes, uncomplicated: Secondary | ICD-10-CM

## 2017-08-02 DIAGNOSIS — Z8614 Personal history of Methicillin resistant Staphylococcus aureus infection: Secondary | ICD-10-CM

## 2017-08-02 DIAGNOSIS — R509 Fever, unspecified: Secondary | ICD-10-CM

## 2017-08-02 DIAGNOSIS — E785 Hyperlipidemia, unspecified: Secondary | ICD-10-CM

## 2017-08-02 DIAGNOSIS — Z170 Estrogen receptor positive status [ER+]: Secondary | ICD-10-CM

## 2017-08-02 DIAGNOSIS — K5900 Constipation, unspecified: Secondary | ICD-10-CM | POA: Diagnosis not present

## 2017-08-02 DIAGNOSIS — D693 Immune thrombocytopenic purpura: Secondary | ICD-10-CM

## 2017-08-02 DIAGNOSIS — C7951 Secondary malignant neoplasm of bone: Secondary | ICD-10-CM | POA: Diagnosis not present

## 2017-08-02 DIAGNOSIS — Z803 Family history of malignant neoplasm of breast: Secondary | ICD-10-CM

## 2017-08-02 DIAGNOSIS — M1990 Unspecified osteoarthritis, unspecified site: Secondary | ICD-10-CM

## 2017-08-02 DIAGNOSIS — R350 Frequency of micturition: Secondary | ICD-10-CM

## 2017-08-02 DIAGNOSIS — Z95828 Presence of other vascular implants and grafts: Secondary | ICD-10-CM

## 2017-08-02 DIAGNOSIS — D701 Agranulocytosis secondary to cancer chemotherapy: Secondary | ICD-10-CM

## 2017-08-02 DIAGNOSIS — M810 Age-related osteoporosis without current pathological fracture: Secondary | ICD-10-CM | POA: Diagnosis not present

## 2017-08-02 DIAGNOSIS — Z923 Personal history of irradiation: Secondary | ICD-10-CM

## 2017-08-02 DIAGNOSIS — K1230 Oral mucositis (ulcerative), unspecified: Secondary | ICD-10-CM | POA: Diagnosis not present

## 2017-08-02 DIAGNOSIS — J449 Chronic obstructive pulmonary disease, unspecified: Secondary | ICD-10-CM

## 2017-08-02 DIAGNOSIS — Z79811 Long term (current) use of aromatase inhibitors: Secondary | ICD-10-CM

## 2017-08-02 DIAGNOSIS — R0602 Shortness of breath: Secondary | ICD-10-CM

## 2017-08-02 DIAGNOSIS — K219 Gastro-esophageal reflux disease without esophagitis: Secondary | ICD-10-CM

## 2017-08-02 DIAGNOSIS — Z9221 Personal history of antineoplastic chemotherapy: Secondary | ICD-10-CM

## 2017-08-02 DIAGNOSIS — R51 Headache: Secondary | ICD-10-CM

## 2017-08-02 DIAGNOSIS — I10 Essential (primary) hypertension: Secondary | ICD-10-CM

## 2017-08-02 DIAGNOSIS — Z79899 Other long term (current) drug therapy: Secondary | ICD-10-CM

## 2017-08-02 DIAGNOSIS — R3915 Urgency of urination: Secondary | ICD-10-CM

## 2017-08-02 DIAGNOSIS — Z85828 Personal history of other malignant neoplasm of skin: Secondary | ICD-10-CM

## 2017-08-02 LAB — TYPE AND SCREEN
ABO/RH(D): O POS
Antibody Screen: POSITIVE
DONOR AG TYPE: NEGATIVE
Unit division: 0

## 2017-08-02 LAB — BPAM RBC
BLOOD PRODUCT EXPIRATION DATE: 201906062359
ISSUE DATE / TIME: 201905231149
Unit Type and Rh: 5100

## 2017-08-02 LAB — URINALYSIS, COMPLETE (UACMP) WITH MICROSCOPIC
BACTERIA UA: NONE SEEN
Bilirubin Urine: NEGATIVE
Glucose, UA: NEGATIVE mg/dL
Ketones, ur: NEGATIVE mg/dL
Leukocytes, UA: NEGATIVE
NITRITE: NEGATIVE
PROTEIN: NEGATIVE mg/dL
SQUAMOUS EPITHELIAL / LPF: NONE SEEN (ref 0–5)
Specific Gravity, Urine: 1.014 (ref 1.005–1.030)
pH: 5 (ref 5.0–8.0)

## 2017-08-02 MED ORDER — DRONABINOL 2.5 MG PO CAPS: 2.5000 mg | ORAL_CAPSULE | Freq: Two times a day (BID) | ORAL | 0 refills | Status: DC

## 2017-08-02 MED ORDER — SODIUM CHLORIDE 0.9% FLUSH
10.0000 mL | INTRAVENOUS | Status: DC | PRN
  Administered 2017-08-02: 10 mL via INTRAVENOUS
  Filled 2017-08-02: qty 10

## 2017-08-02 MED ORDER — CYANOCOBALAMIN 1000 MCG/ML IJ SOLN
1000.0000 ug | Freq: Once | INTRAMUSCULAR | Status: AC
  Administered 2017-08-02: 1000 ug via INTRAMUSCULAR

## 2017-08-02 MED ORDER — HEPARIN SOD (PORK) LOCK FLUSH 100 UNIT/ML IV SOLN
500.0000 [IU] | Freq: Once | INTRAVENOUS | Status: AC
  Administered 2017-08-02: 500 [IU] via INTRAVENOUS

## 2017-08-02 MED ORDER — TBO-FILGRASTIM 480 MCG/0.8ML ~~LOC~~ SOSY
480.0000 ug | PREFILLED_SYRINGE | Freq: Once | SUBCUTANEOUS | Status: AC
  Administered 2017-08-02: 480 ug via SUBCUTANEOUS

## 2017-08-02 MED ORDER — SODIUM CHLORIDE 0.9 % IV SOLN
Freq: Once | INTRAVENOUS | Status: AC
  Administered 2017-08-02: 15:00:00 via INTRAVENOUS
  Filled 2017-08-02: qty 1000

## 2017-08-02 MED ORDER — AMOXICILLIN-POT CLAVULANATE 875-125 MG PO TABS: 1.0000 | ORAL_TABLET | Freq: Two times a day (BID) | ORAL | 0 refills | Status: DC

## 2017-08-02 NOTE — Patient Instructions (Signed)
Dronabinol, THC capsules What is this medicine? DRONABINOL (droe NAB i nol) is used to treat nausea and vomiting caused by cancer treatment, especially for those patients who do not respond to other medicines. This medicine is also used to increase appetite in AIDS patients. This medicine may be used for other purposes; ask your health care provider or pharmacist if you have questions. COMMON BRAND NAME(S): Marinol What should I tell my health care provider before I take this medicine? They need to know if you have any of these conditions: -a history of drug or alcohol abuse -heart disease, including angina or irregular heart rate -high or low blood pressure -dizziness or fainting spells on standing -mental health problems (schizophrenia, mania, depression) -seizures -an unusual or allergic reaction to dronabinol, marijuana, sesame oil, other medicines, foods, dyes, or preservatives -pregnant or trying to get pregnant -breast-feeding How should I use this medicine? Take this medicine by mouth. Follow the directions on the prescription label. Take your doses at regular intervals. Do not take your medicine more often than directed. Talk to your pediatrician regarding the use of this medicine in children. Special care may be needed. Overdosage: If you think you have taken too much of this medicine contact a poison control center or emergency room at once. NOTE: This medicine is only for you. Do not share this medicine with others. What if I miss a dose? If you miss a dose, take it as soon as you can. If it is almost time for your next dose, take only that dose. Do not take double or extra doses. What may interact with this medicine? Do not take this medicine with any of the following medications: -nabilone This medicine may also interact with the following medications: -alcohol-containing medicines or drinks -amphetamine or other stimulant drugs -antihistamines for allergy, cough and  cold -atropine -barbiturates such as phenobarbital -certain medicines for anxiety or sleep -certain medicines for bladder problems like oxybutynin, tolterodine -certain medicines for depression, like amitriptyline, fluoxetine, sertraline -certain medicines for seizures like phenobarbital, primidone -certain medicines for stomach problems like dicyclomine, hyoscyamine -certain medicines for travel sickness like scopolamine -certain medicines for Parkinson's disease like benztropine, trihexyphenidyl -cocaine -disulfiram -general anesthetics like halothane, isoflurane, methoxyflurane, propofol -ipratropium -lithium -local anesthetics like lidocaine, pramoxine, tetracaine -medicines that relax muscles for surgery -narcotic medicines for pain -phenothiazines like chlorpromazine, mesoridazine, prochlorperazine, thioridazine -theophylline This list may not describe all possible interactions. Give your health care provider a list of all the medicines, herbs, non-prescription drugs, or dietary supplements you use. Also tell them if you smoke, drink alcohol, or use illegal drugs. Some items may interact with your medicine. What should I watch for while using this medicine? The first time you take this medicine or have an increase in dose make sure there is a responsible person nearby. You may experience mood changes, easy laughter, or other changes in behavior. You may get drowsy or dizzy. Do not drive, use machinery, or do anything that needs mental alertness until you know how this medicine affects you. Do not stand or sit up quickly, especially if you have low blood pressure or if you are an older patient. This reduces the risk of dizzy or fainting spells. Alcohol may interfere with the effect of this medicine. Avoid alcoholic drinks. If you are taking this medicine to improve your appetite, this is only part of your therapy. Discuss what you can eat and ways of improving your diet with your health  care professional or nutritionist. Frequent small  snacks as well as regular meals can help provide extra calories and protein. Do not smoke marijuana while you are taking this medicine. It is similar to one of the active substances found in marijuana. You are at increased risk of serious heart and/or nervous system side effects if these drugs are used together. What side effects may I notice from receiving this medicine? Side effects that you should report to your doctor or health care professional as soon as possible: -allergic reactions like skin rash, itching or hives, swelling of the face, lips, or tongue -fast, irregular heartbeat -feeling faint or lightheaded, falls -hallucinations -panic reactions -seizures -slurred speech Side effects that usually do not require medical attention (report to your doctor or health care professional if they continue or are bothersome): -anxiety or nervousness -confusion -dizziness -nausea, vomiting or diarrhea -stomach upset -tiredness This list may not describe all possible side effects. Call your doctor for medical advice about side effects. You may report side effects to FDA at 1-800-FDA-1088. Where should I keep my medicine? This medicine may cause accidental overdose and death if taken by other adults, children, or pets. Keep out of the reach of children. This medicine can be abused. Keep your medicine in a safe place to protect it from theft. Do not share this medicine with anyone. Selling or giving away this medicine is dangerous and against the law. Store in a cool place between 8 and 15 degrees C (46 and 59 degrees F) or in a refrigerator. Avoid freezing. Mix any unused medicine with a substance like cat litter or coffee grounds. Then throw the medicine away in a sealed container like a sealed bag or a coffee can with a lid. Do not use the medicine after the expiration date. NOTE: This sheet is a summary. It may not cover all possible information.  If you have questions about this medicine, talk to your doctor, pharmacist, or health care provider.  2018 Elsevier/Gold Standard (2015-11-18 12:09:40)

## 2017-08-02 NOTE — Progress Notes (Signed)
Symptom Management Consult note [facility]  [phone] [phone]  Patient Care Team: Shelly Haven, MD as PCP - General (Family Medicine) Shelly Arias, DPM as Consulting Physician (Podiatry) Shelly Sickle, MD as Consulting Physician (Internal Medicine) Shelly Castilla [place] Gleason, MD (General Surgery)   Name of the patient: Shelly Arias  [id number]  1945-08-20   Date of visit: 08/02/17  Diagnosis- MDS/metastatic breast cancer  Chief complaint/ Reason for visit- Fever/chills  Heme/Onc history: Patient was last seen by primary medical oncologist Dr. Rogue Bussing on 08/01/2017.  During cycle 1 of Dacogen (4/1/4/5) patient developed severe neutropenia, thrombocytopenia and anemia.  Platelets have remained low and she has required platelet and packed red blood cell transfusion often.  Her ANC remains 0.  She is currently on prophylactic antibiotics for low-grade fevers of unknown origin.  Just finished Augmentin x5 days and began Levaquin this morning.  She is also on acyclovir and Diflucan prophylactically.   Has history of stage IV recurrent metastatic breast cancer.  Currently on Femara.  Most recent PET scan showed significant response.  She returns to the cancer center Mondays and Thursdays for labs with possible transfusions on Tuesdays and Fridays.  She has been seen several times in symptom management clinic for fevers and dehydration.  She was seen last on 07/15/17 for abdominal pain.  Symptoms resolved spontaneously and no imaging was performed.  She was found to be tachycardic with a low-grade temperature.  Blood cultures were negative.  Lactic acid normal.  Given IV fluids with improving vital signs and temperature.  Seen on 06/26/2017 for left eye pain.  She was found to have a hordeolum of left eye and treated with topical antibiotics (erythromycin ointment) for 7 days.    Seen on 07/05/2017 for ear pain. She was switched from  Cipro and placed on Augmentin for possible sinus infection.   Oncology History   # April 2018- Left chest wall Bx- [facility]; ER-PR-POS; her 2 Neu NEG; PET- mild hilar/distal adenopathy; ~1 cm right lung nodule/ several sub-centimeter.   # May 2018- cont [facility]; Abemacliclib [on HOLD sec to cytopenia]; MARCH 2019- PET near CR from breast cancer  # April 2017-isolated Thrombocytopenia- platelets- 113; worsening PANCYTOPENIA- April 2018- BMBx- no evidence of malignancy; MDS/Acute leuk; Karyotype/MDS- FISH panel-NEG [d/w Dr.Smir];   # June 2018- REPEAT BMBx/[facility] [June 2018-Dr.Foster; Aug 2018- Dr.Powell at [facility]]; No Diagnosis.   # April 11, 2017 [third bone marrow]-is still inconclusive; no obvious evidence of acute leukemia or obvious MDS except for mild dysplastic changes; and significantly reduced megakaryocytes. 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 [reviwed at [facility]; [facility]-NEG]  # April 1st 2019- Dacogen   # May 29th 2018- N-plate- suboptimal improvement; NOV 1st week start promacta 50 mg/day; suboptimal response; DEC 1st- start promacta 75 mg/day  # DEC 18th-INCREASE PROMACTA to 150mg /day  # 2006- RIGHT BREAST CA [pT1cN0M0; STAGE I; ER/PRPos; Her 2 Neu-NEG] s/p FEC x 6 [NSABP B-36]; Femara [Dec 2007-2012]  # Osteoporosis s/p Reclast; BMD- 2015-wnl- ca+vit D   Dr.Stewart [derm ? Squamous cell s/p freeze-oct 2018] --------------------------------------------------------------  DIAGNOSIS: [name] 2017 ] # ? MDS /Meta.Breast cancer- ER/PR pos; her-NEG  STAGE:  IV       ;GOALS: palliative  CURRENT/MOST RECENT THERAPY: dacogen [April 2019]; Letrozole [May 2017]         Carcinoma of overlapping sites of right breast in female, estrogen receptor positive ([place])  MDS (myelodysplastic syndrome) (HCC)    Interval history-  Patient presents with low grade fevers. She has had the fever for 1  day.  Symptoms have been unchanged. Symptoms associated with the fever include chills, and patient denies abdominal pain, diarrhea, headache, nausea and URI symptoms.. Symptoms are worse at no particular time of day.  Symptoms have been stable since that time. Patient has been sleeping well. Appetite has been stable. Urine output has been improved. She has associated symptoms of   She has tried to alleviate the symptoms with acetaminophen with complete relief.   The patient has immunocompromised state of MDS. Daycare:no. Exposure to tobacco: no. Exposure to someone else at home w/similar symptoms:no Exposure to someone else at daycare/school/work:no.  ECOG FS:1 - Symptomatic but completely ambulatory  Review of systems- Review of Systems  Constitutional: Positive for chills and fever. Negative for malaise/fatigue and weight loss.  HENT: Negative for congestion and ear pain.   Eyes: Negative.  Negative for blurred vision and double vision.  Respiratory: Negative.  Negative for cough, sputum production and shortness of breath.   Cardiovascular: Negative.  Negative for chest pain, palpitations and leg swelling.  Gastrointestinal: Negative.  Negative for abdominal pain, constipation, diarrhea, nausea and vomiting.  Genitourinary: Negative for dysuria, frequency and urgency.  Musculoskeletal: Negative for back pain and falls.  Skin: Negative.  Negative for rash.  Neurological: Negative.  Negative for weakness and headaches.  Endo/Heme/Allergies: Negative.  Does not bruise/bleed easily.  Psychiatric/Behavioral: Negative.  Negative for depression. The patient is not nervous/anxious and does not have insomnia.      Current treatment- Dacogen Days 1-5 every 4 weeks. Last given in April 1-5.   Allergies  Allergen Reactions  . Codeine Anaphylaxis  . Fish-Derived Products Anaphylaxis  . Morphine Sulfate Anaphylaxis    REACTION: anaphylaxis  . Adhesive [Tape]     PAPER TAPE OK TO USE  . Oxycodone      Nausea and vomiting     Past Medical History:  Diagnosis Date  . Blood dyscrasia   . Breast cancer ([place])    right, lumpectomy, radiation, chemo  . Breast cancer ([place])    Right, 2007  . Breast cancer ([place]) 06/20/2016   INVASIVE DUCTAL CARCINOMA.  . Collapsed lung 02/14/2017   RIGHT  . Complication of anesthesia    PT STATED AFTER BIL HIP SURGERIES SHE STOPPED BREATHING THE NIGHT AFTER SURGERY  . COPD (chronic obstructive pulmonary disease) ([place])   . GERD (gastroesophageal reflux disease)   . Headache   . History of chemotherapy   . History of methicillin resistant staphylococcus aureus (MRSA) 2011  . History of radiation therapy   . Hyperlipidemia   . Hypertension   . Liver cancer ([place]) 05/2016  . Lung cancer ([place]) 05/2016  . Osteoarthritis    right hip  . Osteoporosis   . Squamous cell carcinoma    leg, Followed by Dr. Nicole Kindred     Past Surgical History:  Procedure Laterality Date  . ABDOMINAL HYSTERECTOMY    . BREAST BIOPSY Left 2002   left breast, calcifications  . BREAST BIOPSY Left 06/20/2016   INVASIVE DUCTAL CARCINOMA.  Marland Kitchen BREAST EXCISIONAL BIOPSY Right 2007   positive  . bunion repair    . ESOPHAGOGASTRODUODENOSCOPY (EGD) WITH PROPOFOL N/A 08/05/2016   Procedure: ESOPHAGOGASTRODUODENOSCOPY (EGD) WITH PROPOFOL;  Surgeon: San Jetty, MD;  Location: [facility] ENDOSCOPY;  Service: General;  Laterality: N/A;  . IR FLUORO GUIDE CV LINE RIGHT  07/25/2017  . JOINT  REPLACEMENT    . left breast biopsy    . NASAL SINUS SURGERY    . PORTACATH PLACEMENT Right 04/13/2017   Procedure: INSERTION PORT-A-CATH- RIGHT INTERNAL JUGULAR;  Surgeon: Robert Bellow, MD;  Location: [facility] ORS;  Service: General;  Laterality: Right;  . right hip replacement    . SHOULDER SURGERY    . SQUAMOUS CELL CARCINOMA EXCISION     right leg, Dr. Nicole Kindred  . TOTAL HIP ARTHROPLASTY     right    Social History   Socioeconomic History  . Marital status: Married    Spouse name: Not on file  .  Number of children: Not on file  . Years of education: Not on file  . Highest education level: Not on file  Occupational History  . Not on file  Social Needs  . Financial resource strain: Not hard at all  . Food insecurity:    Worry: Never true    Inability: Never true  . Transportation needs:    Medical: No    Non-medical: No  Tobacco Use  . Smoking status: Current Some Day Smoker    Packs/day: 0.10    Years: 30.00    Pack years: 3.00    Types: Cigarettes  . Smokeless tobacco: Never Used  Substance and Sexual Activity  . Alcohol use: Yes    Alcohol/week: 0.0 oz    Comment: socially  . Drug use: No  . Sexual activity: Never  Lifestyle  . Physical activity:    Days per week: 4 days    Minutes per session: 30 min  . Stress: Not at all  Relationships  . Social connections:    Talks on phone: Three times a week    Gets together: Twice a week    Attends religious service: More than 4 times per year    Active member of club or organization: No    Attends meetings of clubs or organizations: Never    Relationship status: Married  . Intimate partner violence:    Fear of current or ex partner: No    Emotionally abused: No    Physically abused: No    Forced sexual activity: No  Other Topics Concern  . Not on file  Social History Narrative  . Not on file    Family History  Problem Relation Age of Onset  . Heart disease Father 24  . Heart disease Mother   . Hyperlipidemia Mother   . Heart disease Brother   . Diabetes Brother   . Diabetes Maternal Grandmother   . Breast cancer Cousin   . Kidney disease Brother     No current facility-administered medications for this visit.   Current Outpatient Medications:  .  tranexamic acid (LYSTEDA) 650 MG TABS tablet, Take 2 tablets (1,300 mg total) by mouth 3 (three) times daily., Disp: 45 tablet, Rfl: 2  Facility-Administered Medications Ordered in Other Visits:  .  0.9 %  sodium chloride infusion, , Intravenous,  Continuous, Shelly Sickle, MD, Stopped at 06/28/17 1450 .  0.9 %  sodium chloride infusion, , Intravenous, Continuous, Brahmanday, Elisha Headland, MD, Stopped at 07/29/17 1515 .  acidophilus (RISAQUAD) capsule 1 capsule, 1 capsule, Oral, Daily, Epifanio Lesches, MD, 1 capsule at 08/07/17 0902 .  acyclovir (ZOVIRAX) 200 MG capsule 400 mg, 400 mg, Oral, BID, Epifanio Lesches, MD, 400 mg at 08/07/17 0902 .  ALPRAZolam (XANAX) tablet 0.5 mg, 0.5 mg, Oral, Q8H PRN, Epifanio Lesches, MD .  cyanocobalamin ((VITAMIN B-12)) injection 1,000  mcg, 1,000 mcg, Intramuscular, Q30 days, Epifanio Lesches, MD .  dronabinol (MARINOL) capsule 2.5 mg, 2.5 mg, Oral, BID AC, Epifanio Lesches, MD, 2.5 mg at 08/06/17 1703 .  famotidine (PEPCID) tablet 10 mg, 10 mg, Oral, Daily, Epifanio Lesches, MD, 10 mg at 08/07/17 0902 .  feeding supplement (ENSURE ENLIVE) (ENSURE ENLIVE) liquid 237 mL, 237 mL, Oral, BID BM, Manuella Ghazi, Vipul, MD, 237 mL at 08/07/17 0906 .  heparin lock flush 100 unit/mL, 500 Units, Intravenous, Once, Charlaine Dalton R, MD .  letrozole [facility]) tablet 2.5 mg, 2.5 mg, Oral, Daily, Epifanio Lesches, MD, 2.5 mg at 08/07/17 0902 .  loratadine (CLARITIN) tablet 10 mg, 10 mg, Oral, Daily, Epifanio Lesches, MD, 10 mg at 08/07/17 0902 .  magnesium sulfate IVPB 4 g 100 mL, 4 g, Intravenous, Once, Max Sane, MD, Last Rate: 50 mL/hr at 08/07/17 1133, 4 g at 08/07/17 1133 .  metoprolol succinate (TOPROL-XL) 24 hr tablet 25 mg, 25 mg, Oral, Daily, Epifanio Lesches, MD, 25 mg at 08/07/17 0902 .  prochlorperazine (COMPAZINE) tablet 10 mg, 10 mg, Oral, Q6H PRN, Epifanio Lesches, MD .  sodium chloride flush (NS) 0.9 % injection 10 mL, 10 mL, Intravenous, PRN, Charlaine Dalton R, MD .  sodium chloride flush (NS) 0.9 % injection 10 mL, 10 mL, Intravenous, PRN, Verlon Au, NP .  sodium chloride flush (NS) 0.9 % injection 10 mL, 10 mL, Intravenous, PRN, Charlaine Dalton  R, MD, 10 mL at 07/29/17 1405 .  Tbo-Filgrastim (GRANIX) injection 480 mcg, 480 mcg, Subcutaneous, Once, Manuella Ghazi, Vipul, MD .  tranexamic acid (LYSTEDA) tablet 1,300 mg, 1,300 mg, Oral, TID, Shelly Sickle, MD, 1,300 mg at 08/07/17 0901 .  [START ON 08/10/2017] Vitamin D (Ergocalciferol) (DRISDOL) capsule 50,000 Units, 50,000 Units, Oral, Q Sat, Epifanio Lesches, MD  Physical exam:  Vitals:   08/02/17 1428  BP: (!) 158/84  Pulse: (!) 129  Resp: 20  Temp: 100.3 F (37.9 C)  TempSrc: Tympanic  SpO2: 99%   Physical Exam  Constitutional: She is oriented to person, place, and time. Vital signs are normal.  HENT:  Head: Normocephalic and atraumatic.  Eyes: Pupils are equal, round, and reactive to light.  Neck: Normal range of motion.  Cardiovascular: Regular rhythm and normal heart sounds. Tachycardia present.  No murmur heard. Pulmonary/Chest: Effort normal and breath sounds normal. She has no wheezes.  Abdominal: Soft. Normal appearance and bowel sounds are normal. She exhibits no distension. There is no tenderness.  Musculoskeletal: Normal range of motion. She exhibits no edema.  Neurological: She is alert and oriented to person, place, and time.  Skin: Skin is warm and dry. No rash noted.  Psychiatric: Judgment normal.     CMP Latest Ref Rng & Units 08/07/2017  Glucose 65 - 99 mg/dL 203(H)  BUN 6 - 20 mg/dL 16  Creatinine 0.44 - 1.00 mg/dL 0.69  Sodium 135 - 145 mmol/L 134(L)  Potassium 3.5 - 5.1 mmol/L 3.8  Chloride 101 - 111 mmol/L 99(L)  CO2 22 - 32 mmol/L 25  Calcium 8.9 - 10.3 mg/dL 8.0(L)  Total Protein 6.5 - 8.1 g/dL -  Total Bilirubin 0.3 - 1.2 mg/dL -  Alkaline Phos 38 - 126 U/L -  AST 15 - 41 U/L -  ALT 14 - 54 U/L -   CBC Latest Ref Rng & Units 08/07/2017  WBC 3.6 - 11.0 K/uL 0.8(LL)  Hemoglobin 12.0 - 16.0 g/dL 9.3(L)  Hematocrit 35.0 - 47.0 % 26.4(L)  Platelets 150 - 440  K/uL 16(LL)    No images are attached to the encounter.  Dg Chest 2  View  Result Date: 07/21/2017 CLINICAL DATA:  Fever today. EXAM: CHEST - 2 VIEW COMPARISON:  PET CT scan 06/03/2017. PA and lateral chest 03/15/2017. Single-view of the chest 04/13/2017. FINDINGS: Right Port-A-Cath is unchanged since the most recent exam. The patient is status post right axillary dissection. Lungs are clear. Heart size is normal. No pneumothorax or pleural fluid. Aortic atherosclerosis is identified. Convex left lumbar scoliosis and multilevel thoracic and lumbar degenerative disease are noted. IMPRESSION: No acute disease. Electronically Signed   By: Inge Rise M.D.   On: 07/21/2017 15:49   Ir Fluoro Guide Cv Line Right  Result Date: 07/25/2017 INDICATION: 72 year old with history of breast cancer and 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 Patient was admitted for neutropenic fever. Patient has a right jugular Port-A-Cath which is not aspirating. Patient presents for a port check. EXAM: PORT-A-CATH INJECTION UNDER FLUOROSCOPY MEDICATIONS: None ANESTHESIA/SEDATION: None FLUOROSCOPY TIME:  Fluoroscopy Time: 6 seconds, 45 mGy CONTRAST:  10 mL [id number] COMPLICATIONS: None immediate. PROCEDURE: The procedure was explained to the patient. The risks and benefits of the procedure were discussed and the patient's questions were addressed. Informed consent was obtained from the patient. The right chest port was already accessed. The port flushed easily but would not aspirate. The port was flushed with contrast under fluoroscopic evaluation. Despite normal appearance of the port under fluoroscopy, the port would not aspirate. Therefore, the existing needle was completely removed. A new access needle was placed with sterile technique. The port was able to aspirate and flush with placement of the new port needle. Fluoroscopic images were taken and saved for this procedure. FINDINGS: Right jugular Port-A-Cath. Catheter tip at the superior cavoatrial junction. No evidence for port discontinuity or  contrast extravasation. Contrast empties into the right atrium without evidence of a fibrin sheath. IMPRESSION: Normal appearance of the right jugular Port-A-Cath. Port-A-Cath tip is well positioned and no evidence for a fibrin sheath. The Port-A-Cath was aspirating normally after a new access needle was placed. Electronically Signed   By: Markus Daft M.D.   On: 07/25/2017 16:48   US Pelvic Complete With Transvaginal  Result Date: 08/05/2017 CLINICAL DATA:  Vaginal bleeding for 1 day. Status post hysterectomy. History of right breast cancer. EXAM: TRANSABDOMINAL AND TRANSVAGINAL ULTRASOUND OF PELVIS TECHNIQUE: Both transabdominal and transvaginal ultrasound examinations of the pelvis were performed. Transabdominal technique was performed for global imaging of the pelvis including uterus, ovaries, adnexal regions, and pelvic cul-de-sac. It was necessary to proceed with endovaginal exam following the transabdominal exam to visualize the ovary/adnexa. COMPARISON:  PET 06/03/2017 FINDINGS: Uterus Surgically absent Right ovary Surgically absent Left ovary Not visualized Other findings No abnormal free fluid. IMPRESSION: Hysterectomy and right oophorectomy. No explanation for vaginal bleeding. Left ovary not visualized. Electronically Signed   By: Abigail Miyamoto M.D.   On: 08/05/2017 15:47     Assessment and plan- Patient is a 72 y.o. female who presents for fever.  1. MDS/severe neutropenia: Received platelet's today.  Platelet count 17,000 today.  Hemoglobin 8.1.  Last received Dacogen days 1-5 on 4/1-4/5.  Currently on letrozole for metastatic breast cancer.  On prophylactic antibiotics.  Started back on Levaquin today.  Finished Augmentin yesterday.  Continues Diflucan and acyclovir prophylactically as well.  2. Fever: High as 100.3 in clinic.  Will give 1 L NaCl. In clinic.  Started back on Levaquin this morning.  Per Dr. Rogue Bussing, add Augmentin back to  her medication regimen.  She is to take both  antibiotics.  Will check urinalysis/urine culture.  Negative for urinary tract infection.   3. Tachycardia: HR 113.  Dehydration??? Fever???  Resolved after 1 L NaCl.  HR 95. Blood pressure baseline.  Fever/Tachycardia resolved with 1 L normal saline.  98.9 before discharge from clinic.  Patient knows to call with any additional complaints, fevers or bleeding.  Visit Diagnosis 1. Fever, unspecified   2. Urgency of urination   3. Frequency of urination     Patient expressed understanding and was in agreement with this plan. She also understands that She can call clinic at any time with any questions, concerns, or complaints.   Greater than 50% was spent in counseling and coordination of care with this patient including but not limited to discussion of the relevant topics above (See A&P) including, but not limited to diagnosis and management of acute and chronic medical conditions.    Faythe Casa, AGNP-C [facility] [place]- [phone] Pager- [phone] 08/07/2017 11:47 AM

## 2017-08-03 LAB — URINE CULTURE: CULTURE: NO GROWTH

## 2017-08-05 ENCOUNTER — Encounter: Payer: Self-pay | Admitting: Emergency Medicine

## 2017-08-05 ENCOUNTER — Inpatient Hospital Stay: Payer: Medicare Other

## 2017-08-05 ENCOUNTER — Other Ambulatory Visit: Payer: Self-pay

## 2017-08-05 ENCOUNTER — Inpatient Hospital Stay
Admission: EM | Admit: 2017-08-05 | Discharge: 2017-08-07 | DRG: 760 | Disposition: A | Discharge status: Home Health | Payer: Medicare Other | Attending: Internal Medicine | Admitting: Internal Medicine

## 2017-08-05 DIAGNOSIS — C787 Secondary malignant neoplasm of liver and intrahepatic bile duct: Secondary | ICD-10-CM | POA: Diagnosis present

## 2017-08-05 DIAGNOSIS — Z91048 Other nonmedicinal substance allergy status: Secondary | ICD-10-CM

## 2017-08-05 DIAGNOSIS — C50919 Malignant neoplasm of unspecified site of unspecified female breast: Secondary | ICD-10-CM | POA: Diagnosis not present

## 2017-08-05 DIAGNOSIS — C50911 Malignant neoplasm of unspecified site of right female breast: Secondary | ICD-10-CM | POA: Diagnosis not present

## 2017-08-05 DIAGNOSIS — Z833 Family history of diabetes mellitus: Secondary | ICD-10-CM

## 2017-08-05 DIAGNOSIS — D696 Thrombocytopenia, unspecified: Secondary | ICD-10-CM | POA: Diagnosis not present

## 2017-08-05 DIAGNOSIS — R300 Dysuria: Secondary | ICD-10-CM | POA: Diagnosis not present

## 2017-08-05 DIAGNOSIS — N952 Postmenopausal atrophic vaginitis: Principal | ICD-10-CM | POA: Diagnosis present

## 2017-08-05 DIAGNOSIS — Z91013 Allergy to seafood: Secondary | ICD-10-CM

## 2017-08-05 DIAGNOSIS — Z96641 Presence of right artificial hip joint: Secondary | ICD-10-CM | POA: Diagnosis present

## 2017-08-05 DIAGNOSIS — N939 Abnormal uterine and vaginal bleeding, unspecified: Secondary | ICD-10-CM | POA: Diagnosis present

## 2017-08-05 DIAGNOSIS — Z923 Personal history of irradiation: Secondary | ICD-10-CM

## 2017-08-05 DIAGNOSIS — F419 Anxiety disorder, unspecified: Secondary | ICD-10-CM | POA: Diagnosis present

## 2017-08-05 DIAGNOSIS — Z8614 Personal history of Methicillin resistant Staphylococcus aureus infection: Secondary | ICD-10-CM

## 2017-08-05 DIAGNOSIS — Z6824 Body mass index (BMI) 24.0-24.9, adult: Secondary | ICD-10-CM

## 2017-08-05 DIAGNOSIS — Z79811 Long term (current) use of aromatase inhibitors: Secondary | ICD-10-CM | POA: Diagnosis not present

## 2017-08-05 DIAGNOSIS — N938 Other specified abnormal uterine and vaginal bleeding: Secondary | ICD-10-CM | POA: Diagnosis present

## 2017-08-05 DIAGNOSIS — F17210 Nicotine dependence, cigarettes, uncomplicated: Secondary | ICD-10-CM

## 2017-08-05 DIAGNOSIS — E440 Moderate protein-calorie malnutrition: Secondary | ICD-10-CM

## 2017-08-05 DIAGNOSIS — J449 Chronic obstructive pulmonary disease, unspecified: Secondary | ICD-10-CM | POA: Diagnosis not present

## 2017-08-05 DIAGNOSIS — R630 Anorexia: Secondary | ICD-10-CM | POA: Diagnosis not present

## 2017-08-05 DIAGNOSIS — M810 Age-related osteoporosis without current pathological fracture: Secondary | ICD-10-CM

## 2017-08-05 DIAGNOSIS — Z853 Personal history of malignant neoplasm of breast: Secondary | ICD-10-CM

## 2017-08-05 DIAGNOSIS — D469 Myelodysplastic syndrome, unspecified: Secondary | ICD-10-CM | POA: Diagnosis not present

## 2017-08-05 DIAGNOSIS — Z803 Family history of malignant neoplasm of breast: Secondary | ICD-10-CM

## 2017-08-05 DIAGNOSIS — Z8349 Family history of other endocrine, nutritional and metabolic diseases: Secondary | ICD-10-CM | POA: Diagnosis not present

## 2017-08-05 DIAGNOSIS — Z170 Estrogen receptor positive status [ER+]: Secondary | ICD-10-CM

## 2017-08-05 DIAGNOSIS — D61818 Other pancytopenia: Secondary | ICD-10-CM

## 2017-08-05 DIAGNOSIS — E785 Hyperlipidemia, unspecified: Secondary | ICD-10-CM | POA: Diagnosis not present

## 2017-08-05 DIAGNOSIS — Z841 Family history of disorders of kidney and ureter: Secondary | ICD-10-CM | POA: Diagnosis not present

## 2017-08-05 DIAGNOSIS — Z90710 Acquired absence of both cervix and uterus: Secondary | ICD-10-CM

## 2017-08-05 DIAGNOSIS — M1990 Unspecified osteoarthritis, unspecified site: Secondary | ICD-10-CM

## 2017-08-05 DIAGNOSIS — E876 Hypokalemia: Secondary | ICD-10-CM

## 2017-08-05 DIAGNOSIS — C7800 Secondary malignant neoplasm of unspecified lung: Secondary | ICD-10-CM | POA: Diagnosis present

## 2017-08-05 DIAGNOSIS — K219 Gastro-esophageal reflux disease without esophagitis: Secondary | ICD-10-CM

## 2017-08-05 DIAGNOSIS — Z885 Allergy status to narcotic agent status: Secondary | ICD-10-CM

## 2017-08-05 DIAGNOSIS — Z8249 Family history of ischemic heart disease and other diseases of the circulatory system: Secondary | ICD-10-CM

## 2017-08-05 DIAGNOSIS — C7981 Secondary malignant neoplasm of breast: Secondary | ICD-10-CM

## 2017-08-05 DIAGNOSIS — I10 Essential (primary) hypertension: Secondary | ICD-10-CM | POA: Diagnosis not present

## 2017-08-05 DIAGNOSIS — Z79899 Other long term (current) drug therapy: Secondary | ICD-10-CM

## 2017-08-05 DIAGNOSIS — Z9221 Personal history of antineoplastic chemotherapy: Secondary | ICD-10-CM

## 2017-08-05 DIAGNOSIS — R000 Tachycardia, unspecified: Secondary | ICD-10-CM | POA: Diagnosis not present

## 2017-08-05 LAB — CBC
HCT: 21.1 % — ABNORMAL LOW (ref 35.0–47.0)
Hemoglobin: 7.4 g/dL — ABNORMAL LOW (ref 12.0–16.0)
MCH: 30.5 pg (ref 26.0–34.0)
MCHC: 35 g/dL (ref 32.0–36.0)
MCV: 87.1 fL (ref 80.0–100.0)
PLATELETS: 5 10*3/uL — AB (ref 150–440)
RBC: 2.42 MIL/uL — AB (ref 3.80–5.20)
RDW: 13.3 % (ref 11.5–14.5)
WBC: 0.8 10*3/uL — CL (ref 3.6–11.0)

## 2017-08-05 LAB — COMPREHENSIVE METABOLIC PANEL
ALT: 24 U/L (ref 14–54)
AST: 21 U/L (ref 15–41)
Albumin: 2.4 g/dL — ABNORMAL LOW (ref 3.5–5.0)
Alkaline Phosphatase: 51 U/L (ref 38–126)
Anion gap: 9 (ref 5–15)
BILIRUBIN TOTAL: 0.6 mg/dL (ref 0.3–1.2)
BUN: 15 mg/dL (ref 6–20)
CHLORIDE: 99 mmol/L — AB (ref 101–111)
CO2: 28 mmol/L (ref 22–32)
CREATININE: 0.96 mg/dL (ref 0.44–1.00)
Calcium: 7.8 mg/dL — ABNORMAL LOW (ref 8.9–10.3)
GFR calc Af Amer: >60 mL/min (ref >60)
GFR, EST NON AFRICAN AMERICAN: 59 mL/min — AB (ref >60)
Glucose, Bld: 143 mg/dL — ABNORMAL HIGH (ref 65–99)
Potassium: 2.8 mmol/L — ABNORMAL LOW (ref 3.5–5.1)
Sodium: 136 mmol/L (ref 135–145)
Total Protein: 5.8 g/dL — ABNORMAL LOW (ref 6.5–8.1)

## 2017-08-05 LAB — HEMOGLOBIN AND HEMATOCRIT, BLOOD
HCT: 21.6 % — ABNORMAL LOW (ref 35.0–47.0)
Hemoglobin: 7.4 g/dL — ABNORMAL LOW (ref 12.0–16.0)

## 2017-08-05 LAB — URINALYSIS, COMPLETE (UACMP) WITH MICROSCOPIC
Bacteria, UA: NONE SEEN
Bilirubin Urine: NEGATIVE
Glucose, UA: NEGATIVE mg/dL
Ketones, ur: NEGATIVE mg/dL
Nitrite: NEGATIVE
PROTEIN: 30 mg/dL — AB
Specific Gravity, Urine: 1.014 (ref 1.005–1.030)
pH: 5 (ref 5.0–8.0)

## 2017-08-05 LAB — PREPARE RBC (CROSSMATCH)

## 2017-08-05 LAB — LACTIC ACID, PLASMA
LACTIC ACID, VENOUS: 0.8 mmol/L (ref 0.5–1.9)
Lactic Acid, Venous: 0.8 mmol/L (ref 0.5–1.9)

## 2017-08-05 MED ORDER — ACETAMINOPHEN 325 MG PO TABS
650.0000 mg | ORAL_TABLET | Freq: Once | ORAL | Status: AC
  Administered 2017-08-05: 20:00:00 650 mg via ORAL
  Filled 2017-08-05: qty 2

## 2017-08-05 MED ORDER — ACYCLOVIR 200 MG PO CAPS
400.0000 mg | ORAL_CAPSULE | Freq: Two times a day (BID) | ORAL | Status: DC
  Administered 2017-08-05 – 2017-08-07 (×5): 400 mg via ORAL
  Filled 2017-08-05 (×6): qty 2

## 2017-08-05 MED ORDER — FAMOTIDINE 20 MG PO TABS
10.0000 mg | ORAL_TABLET | Freq: Every day | ORAL | Status: DC
Start: 2017-08-05 — End: 2017-08-07
  Administered 2017-08-05 – 2017-08-07 (×3): 10 mg via ORAL
  Filled 2017-08-05 (×3): qty 1

## 2017-08-05 MED ORDER — SODIUM CHLORIDE 0.9 % IV SOLN
2.0000 g | Freq: Once | INTRAVENOUS | Status: AC
  Administered 2017-08-05: 2 g via INTRAVENOUS
  Filled 2017-08-05: qty 2

## 2017-08-05 MED ORDER — SODIUM CHLORIDE 0.9 % IV SOLN
Freq: Once | INTRAVENOUS | Status: AC
  Administered 2017-08-05 (×2): via INTRAVENOUS

## 2017-08-05 MED ORDER — METOPROLOL SUCCINATE ER 50 MG PO TB24
25.0000 mg | ORAL_TABLET | Freq: Every day | ORAL | Status: DC
  Administered 2017-08-06 – 2017-08-07 (×2): 25 mg via ORAL
  Filled 2017-08-05 (×2): qty 1

## 2017-08-05 MED ORDER — VITAMIN D (ERGOCALCIFEROL) 1.25 MG (50000 UNIT) PO CAPS: 50000.0000 [IU] | ORAL_CAPSULE | ORAL | Status: DC

## 2017-08-05 MED ORDER — SODIUM CHLORIDE 0.9% FLUSH: 3.0000 mL | INTRAVENOUS | Status: DC | PRN

## 2017-08-05 MED ORDER — DIPHENHYDRAMINE HCL 25 MG PO CAPS
25.0000 mg | ORAL_CAPSULE | Freq: Once | ORAL | Status: AC
  Administered 2017-08-06: 25 mg via ORAL
  Filled 2017-08-05: qty 1

## 2017-08-05 MED ORDER — DIPHENHYDRAMINE HCL 25 MG PO CAPS
25.0000 mg | ORAL_CAPSULE | Freq: Once | ORAL | Status: AC
  Administered 2017-08-05: 20:00:00 25 mg via ORAL
  Filled 2017-08-05: qty 1

## 2017-08-05 MED ORDER — CYANOCOBALAMIN 1000 MCG/ML IJ SOLN
1000.0000 ug | INTRAMUSCULAR | Status: DC
  Filled 2017-08-05: qty 1

## 2017-08-05 MED ORDER — DRONABINOL 2.5 MG PO CAPS
2.5000 mg | ORAL_CAPSULE | Freq: Two times a day (BID) | ORAL | Status: DC
  Administered 2017-08-05 – 2017-08-06 (×2): 2.5 mg via ORAL
  Filled 2017-08-05 (×4): qty 1

## 2017-08-05 MED ORDER — LORATADINE 10 MG PO TABS
10.0000 mg | ORAL_TABLET | Freq: Every day | ORAL | Status: DC
  Administered 2017-08-05 – 2017-08-07 (×3): 10 mg via ORAL
  Filled 2017-08-05 (×3): qty 1

## 2017-08-05 MED ORDER — PROCHLORPERAZINE MALEATE 10 MG PO TABS
10.0000 mg | ORAL_TABLET | Freq: Four times a day (QID) | ORAL | Status: DC | PRN
Start: 2017-08-05 — End: 2017-08-07
  Filled 2017-08-05: qty 1

## 2017-08-05 MED ORDER — POTASSIUM CHLORIDE 10 MEQ/100ML IV SOLN
10.0000 meq | INTRAVENOUS | Status: AC
Start: 2017-08-05 — End: 2017-08-05
  Administered 2017-08-05: 17:00:00 10 meq via INTRAVENOUS
  Filled 2017-08-05 (×2): qty 100

## 2017-08-05 MED ORDER — TBO-FILGRASTIM 480 MCG/0.8ML ~~LOC~~ SOSY
480.0000 ug | PREFILLED_SYRINGE | Freq: Every day | SUBCUTANEOUS | Status: DC
  Administered 2017-08-05 – 2017-08-06 (×2): 480 ug via SUBCUTANEOUS
  Filled 2017-08-05 (×3): qty 0.8

## 2017-08-05 MED ORDER — SODIUM CHLORIDE 0.9 % IV SOLN: Freq: Once | INTRAVENOUS | Status: DC

## 2017-08-05 MED ORDER — RISAQUAD PO CAPS
1.0000 | ORAL_CAPSULE | Freq: Every day | ORAL | Status: DC
Start: 2017-08-05 — End: 2017-08-07
  Administered 2017-08-05 – 2017-08-07 (×3): 1 via ORAL
  Filled 2017-08-05 (×3): qty 1

## 2017-08-05 MED ORDER — METHYLPREDNISOLONE SODIUM SUCC 40 MG IJ SOLR
40.0000 mg | Freq: Once | INTRAMUSCULAR | Status: AC
  Administered 2017-08-06: 14:00:00 40 mg via INTRAVENOUS
  Filled 2017-08-05: qty 1

## 2017-08-05 MED ORDER — POTASSIUM CHLORIDE CRYS ER 20 MEQ PO TBCR
40.0000 meq | EXTENDED_RELEASE_TABLET | ORAL | Status: DC | PRN
  Administered 2017-08-06: 40 meq via ORAL
  Filled 2017-08-05: qty 2

## 2017-08-05 MED ORDER — ACETAMINOPHEN 325 MG PO TABS
650.0000 mg | ORAL_TABLET | Freq: Once | ORAL | Status: AC
  Administered 2017-08-06: 14:00:00 650 mg via ORAL
  Filled 2017-08-05: qty 2

## 2017-08-05 MED ORDER — SODIUM CHLORIDE 0.9% FLUSH: 3.0000 mL | Freq: Two times a day (BID) | INTRAVENOUS | Status: DC

## 2017-08-05 MED ORDER — POTASSIUM CHLORIDE 10 MEQ/100ML IV SOLN
10.0000 meq | Freq: Once | INTRAVENOUS | Status: AC
  Administered 2017-08-05: 18:00:00 10 meq via INTRAVENOUS

## 2017-08-05 MED ORDER — ALPRAZOLAM 0.5 MG PO TABS: 0.5000 mg | ORAL_TABLET | Freq: Three times a day (TID) | ORAL | Status: DC | PRN

## 2017-08-05 MED ORDER — LETROZOLE 2.5 MG PO TABS
2.5000 mg | ORAL_TABLET | Freq: Every day | ORAL | Status: DC
  Administered 2017-08-06 – 2017-08-07 (×2): 2.5 mg via ORAL
  Filled 2017-08-05 (×2): qty 1

## 2017-08-05 NOTE — ED Provider Notes (Addendum)
[facility] Emergency Department Provider Note ____________________________________________   First MD Initiated Contact with Patient 08/05/17 1059     (approximate)  I have reviewed the triage vital signs and the nursing notes.   HISTORY  Chief Complaint Abdominal Pain and Vaginal Bleeding    HPI Shelly Arias is a 72 y.o. 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  She is currently being treated with chemotherapy who presents with vaginal bleeding, acute onset this morning, described as brownish blood and somewhat malodorous, and associated with suprapubic crampy discomfort over the last several days.  Patient states that she is also been running an intermittent low-grade fever over the last few weeks, and is currently on Levaquin and Augmentin for fever of unknown origin.  She denies any prior history of similar bleeding.  Past Medical History:  Diagnosis Date  . Blood dyscrasia   . Breast cancer ([place])    right, lumpectomy, radiation, chemo  . Breast cancer ([place])    Right, 2007  . Breast cancer ([place]) 06/20/2016   INVASIVE DUCTAL CARCINOMA.  . Collapsed lung 02/14/2017   RIGHT  . Complication of anesthesia    PT STATED AFTER BIL HIP SURGERIES SHE STOPPED BREATHING THE NIGHT AFTER SURGERY  . COPD (chronic obstructive pulmonary disease) ([place])   . GERD (gastroesophageal reflux disease)   . Headache   . History of chemotherapy   . History of methicillin resistant staphylococcus aureus (MRSA) 2011  . History of radiation therapy   . Hyperlipidemia   . Hypertension   . Liver cancer ([place]) 05/2016  . Lung cancer ([place]) 05/2016  . Osteoarthritis    right hip  . Osteoporosis   . Squamous cell carcinoma    leg, Followed by Dr. Nicole Kindred    Patient Active Problem List   Diagnosis Date Noted  . Vaginal bleeding 08/05/2017  . Tachycardia 07/30/2017    . Fever 07/21/2017  . History of blood transfusion reaction 02/08/2017  . Constipation 12/27/2016  . B12 deficiency 10/09/2016  . MDS (myelodysplastic syndrome) ([place]) 09/24/2016  . GI bleed 08/03/2016  . Thrombocytopenia ([place]) 08/03/2016  . 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 Persistent headaches 07/02/2016  . Mass of upper inner quadrant of left breast 06/20/2016  . Carcinoma of overlapping sites of right breast in female, estrogen receptor positive ([place]) 06/08/2016  . Chronic fatigue 06/08/2016  . Nutritional anemia, unspecified 06/08/2016  . Prediabetes 01/25/2016  . Tobacco abuse 11/06/2012  . History of breast cancer 11/06/2012  . Osteopenia 11/08/2011  . Hyperlipidemia 11/02/2010  . Essential hypertension 12/23/2006  . GERD 12/23/2006    Past Surgical History:  Procedure Laterality Date  . ABDOMINAL HYSTERECTOMY    . BREAST BIOPSY Left 2002   left breast, calcifications  . BREAST BIOPSY Left 06/20/2016   INVASIVE DUCTAL CARCINOMA.  Marland Kitchen BREAST EXCISIONAL BIOPSY Right 2007   positive  . bunion repair    . ESOPHAGOGASTRODUODENOSCOPY (EGD) WITH PROPOFOL N/A 08/05/2016   Procedure: ESOPHAGOGASTRODUODENOSCOPY (EGD) WITH PROPOFOL;  Surgeon: San Jetty, MD;  Location: [facility] ENDOSCOPY;  Service: General;  Laterality: N/A;  . IR FLUORO GUIDE CV LINE RIGHT  07/25/2017  . JOINT REPLACEMENT    . left breast biopsy    . NASAL SINUS SURGERY    . PORTACATH PLACEMENT Right 04/13/2017   Procedure: INSERTION PORT-A-CATH- RIGHT INTERNAL JUGULAR;  Surgeon: Robert Bellow, MD;  Location: [facility] ORS;  Service: General;  Laterality: Right;  . right hip replacement    . SHOULDER SURGERY    . SQUAMOUS CELL CARCINOMA EXCISION     right leg, Dr. Nicole Kindred  . TOTAL HIP ARTHROPLASTY     right    Prior to Admission medications   Medication Sig Start Date End Date Taking? Authorizing Provider  acyclovir (ZOVIRAX) 400 MG tablet Take 1 tablet (400 mg total) by mouth 2 (two) times daily. 06/24/17  Yes  [name], MD  ALPRAZolam Duanne Moron) 0.5 MG tablet Take 1 tablet (0.5 mg total) by mouth every 8 (eight) hours as needed for anxiety. 07/30/17  Yes [name], MD  amoxicillin-clavulanate (AUGMENTIN) 875-125 MG tablet Take 1 tablet by mouth 2 (two) times daily. 08/02/17  Yes Burns, Wandra Feinstein, NP  cyanocobalamin (,VITAMIN B-12,) 1000 MCG/ML injection Inject 1,000 mcg into the muscle every 30 (thirty) days.   Yes [provider]  dronabinol (MARINOL) 2.5 MG capsule Take 1 capsule (2.5 mg total) by mouth 2 (two) times daily before a meal. 08/02/17  Yes Verlon Au, NP  ergocalciferol (VITAMIN D2) 50000 units capsule Take 1 capsule (50,000 Units total) by mouth every Saturday. In the morning. 05/25/17  Yes [name], MD  fexofenadine (ALLEGRA) 180 MG tablet Take 180 mg by mouth every morning.    Yes [provider]  fluconazole (DIFLUCAN) 200 MG tablet Take 1 tablet (200 mg total) by mouth daily. 06/24/17  Yes [name], MD  Glucosamine-Chondroitin (COSAMIN DS PO) Take 1 tablet by mouth daily. In the morning.   Yes [provider]  letrozole (FEMARA) 2.5 MG tablet Take 1 tablet (2.5 mg total) by mouth daily. Once a day. 06/20/17  Yes [name], MD  levofloxacin (LEVAQUIN) 500 MG tablet Take 500 mg by mouth daily.   Yes [provider]  metoprolol succinate (TOPROL-XL) 25 MG 24 hr tablet Take 1 tablet (25 mg total) by mouth daily. 05/02/17  Yes [name], MD  pravastatin (PRAVACHOL) 40 MG tablet Take 1 tablet (40 mg total) by mouth daily. 05/02/17  Yes [name], MD  Probiotic Product (PROBIOTIC & ACIDOPHILUS EX ST PO) Take 1 tablet by mouth daily.    Yes [provider]  prochlorperazine (COMPAZINE) 10 MG tablet Take 1 tablet (10 mg total) by mouth every 6 (six) hours as needed for nausea or vomiting. 07/15/17  Yes Verlon Au, NP  ranitidine (ZANTAC) 75 MG tablet Take 75 mg by mouth daily  as needed for heartburn.    Yes [provider]    Allergies Codeine; Fish-derived products; Morphine sulfate; Adhesive [tape]; and Oxycodone  Family History  Problem Relation Age of Onset  . Heart disease Father 28  . Heart disease Mother   . Hyperlipidemia Mother   . Heart disease Brother   . Diabetes Brother   . Diabetes Maternal Grandmother   . Breast cancer Cousin   . Kidney disease Brother     Social History Social History   Tobacco Use  . Smoking status: Current Some Day Smoker    Packs/day: 0.10    Years: 30.00    Pack years: 3.00    Types: Cigarettes  . Smokeless tobacco: Never Used  Substance Use Topics  . Alcohol use: Yes    Alcohol/week: 0.0 oz    Comment: socially  . Drug use: No    Review of Systems  Constitutional: Positive for intermittent fever. Eyes: No redness. ENT: No sore throat. Cardiovascular:  Denies chest pain. Respiratory: Denies shortness of breath. Gastrointestinal: No nausea or vomiting. Genitourinary: Positive for vaginal bleeding. Musculoskeletal: Negative for back pain. Skin: Negative for rash. Neurological: Negative for headache.   ____________________________________________   PHYSICAL EXAM:  VITAL SIGNS: ED Triage Vitals  Enc Vitals Group     BP 08/05/17 1019 (!) 134/51     Pulse Rate 08/05/17 1019 (!) 111     Resp 08/05/17 1019 20     Temp 08/05/17 1019 99.8 F (37.7 C)     Temp Source 08/05/17 1019 Oral     SpO2 08/05/17 1019 96 %     Weight 08/05/17 1020 130 lb (59 kg)     Height 08/05/17 1020 5\' 1"  (1.549 m)     Head Circumference --      Peak Flow --      Pain Score 08/05/17 1020 0     Pain Loc --      Pain Edu? --      Excl. in [place]? --     Constitutional: Alert and oriented. Well appearing and in no acute distress. Eyes: Conjunctivae are normal.  Head: Atraumatic. Nose: No congestion/rhinnorhea. Mouth/Throat: Mucous membranes are moist.   Neck: Normal range of motion.  Cardiovascular:  Good  peripheral circulation. Respiratory: Normal respiratory effort.   Gastrointestinal: Soft and nontender.  No distention.  Genitourinary: No flank tenderness.  Small amount of brownish blood in the vaginal vault with no active bleeding. Musculoskeletal:  Extremities warm and well perfused.  Neurologic:  Normal speech and language. No gross focal neurologic deficits are appreciated.  Skin:  Skin is warm and dry. No rash noted. Psychiatric: Mood and affect are normal. Speech and behavior are normal.  ____________________________________________   LABS (all labs ordered are listed, but only abnormal results are displayed)  Labs Reviewed  COMPREHENSIVE METABOLIC PANEL - Abnormal; Notable for the following components:      Result Value   Potassium 2.8 (*)    Chloride 99 (*)    Glucose, Bld 143 (*)    Calcium 7.8 (*)    Total Protein 5.8 (*)    Albumin 2.4 (*)    GFR calc non Af Amer 59 (*)    All other components within normal limits  CBC - Abnormal; Notable for the following components:   WBC 0.8 (*)    RBC 2.42 (*)    Hemoglobin 7.4 (*)    HCT 21.1 (*)    Platelets 5 (*)    All other components within normal limits  URINALYSIS, COMPLETE (UACMP) WITH MICROSCOPIC - Abnormal; Notable for the following components:   Color, Urine YELLOW (*)    APPearance HAZY (*)    Hgb urine dipstick LARGE (*)    Protein, ur 30 (*)    Leukocytes, UA TRACE (*)    All other components within normal limits  CULTURE, BLOOD (ROUTINE X 2)  CULTURE, BLOOD (ROUTINE X 2)  CULTURE, BLOOD (ROUTINE X 2)  CULTURE, BLOOD (ROUTINE X 2)  LACTIC ACID, PLASMA  LACTIC ACID, PLASMA  HEMOGLOBIN AND HEMATOCRIT, BLOOD  HEMOGLOBIN AND HEMATOCRIT, BLOOD  TYPE AND SCREEN  TYPE AND SCREEN  PREPARE RBC (CROSSMATCH)  PREPARE PLATELET PHERESIS  TYPE AND SCREEN   ____________________________________________  EKG  ED ECG REPORT I, Arta Silence, the attending physician, personally viewed and interpreted this  ECG.  Date: 08/05/2017 EKG Time: 1036 Rate: 114 Rhythm: Sinus tachycardia QRS Axis: normal Intervals: normal ST/T Wave abnormalities: LVH with repolarization abnormality Narrative Interpretation: nonspecific  abnormalities; no significant change when compared to EKG of 07/26/2017  ____________________________________________  RADIOLOGY    ____________________________________________   PROCEDURES  Procedure(s) performed: No  Procedures  Critical Care performed: No ____________________________________________   INITIAL IMPRESSION / ASSESSMENT AND PLAN / ED COURSE  Pertinent labs & imaging results that were available during my care of the patient were reviewed by me and considered in my medical decision making (see chart for details).  72 year old female with past medical history as noted above presents with new onset of vaginal bleeding, as well as recurrent low-grade fevers.  She reports some intermittent lower abdominal pain as well, however this is not present currently.  I reviewed the past medical records in [place] and confirmed the past medical history of breast cancer as well as recurrent thrombocytopenia and anemia.  Patient is scheduled for outpatient platelet transfusion tomorrow.  On exam the patient is overall well-appearing, however she does have a low-grade temperature and borderline tachycardia.  There is a small amount of blood in the vaginal vault but no active bleeding.  The abdomen is nontender, and the remainder the exam is as described above.  Differential includes UTI/cystitis, versus other source of infection, or bleeding from the uterine lining or vaginal mucosa related to the low platelets.  Plan: Labs, infection/sepsis work-up, hematology consult and reassess.  ----------------------------------------- 3:31 PM on 08/05/2017 -----------------------------------------  Lab work-up reveals worsening thrombocytopenia and anemia.  UA is consistent with  possible UTI.  I consulted Dr. Mike Gip from hematology who agreed with empiric antibiotics to cover for possible UTI (especially as the patient is already on oral Augmentin and Levaquin due to her recurrent fevers), and the patient will likely need RBC and platelet transfusion as well.  She agrees with admission.  I signed the patient out to the hospitalist Dr. Vianne Bulls.       ____________________________________________   FINAL CLINICAL IMPRESSION(S) / ED DIAGNOSES  Final diagnoses:  Vaginal bleeding  Thrombocytopenia ([place])      NEW MEDICATIONS STARTED DURING THIS VISIT:  New Prescriptions   No medications on file     Note:  This document was prepared using Dragon voice recognition software and may include unintentional dictation errors.    Arta Silence, MD 08/05/17 1529    Arta Silence, MD 08/05/17 [phone]

## 2017-08-05 NOTE — ED Triage Notes (Addendum)
States cramping abdominal pain intermittent x 2 days. States has brown what she thought was vaginal discharge this am, about as much as a pad. States looked like old blood. Has had fever intermittent x 3 days. History breast cancer with mets currently getting chemo. History low platelets.

## 2017-08-05 NOTE — Consult Note (Signed)
[facility]  Date of admission:  08/05/2017  Inpatient day:  08/05/2017  Consulting physician:  Dr. Epifanio Lesches.  Reason for Consultation:  72 -year-old female with history of MDS, anemia, recurrent thrombocytopenia gets frequent blood transfusion, well-known to oncology service.  Chief Complaint: Shelly Arias is a 72 y.o. 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  HPI: The patient was diagnosed with stage I ER/PR+ right breast cancer in 2006.  She received 6 cycles of FEC chemotherapy on NSABP B-36.  She then received Femara.  She was diagnosed with recurrent ER/PR+ breast cancer in 06/2016 after presenting with a left chest wall mass.  PET scan revealed mild hilar/distal adenopathy as well as lung nodules.  She received Femara as well as abemaciclib (Verzenio).  PET scan on 05/2017 revealed a near complete remission.  Patient states that she has struggled with pancytopenia since initial diagnosis of recurrent disease.  Verzenio has been on hold secondary to low counts.  He has had 3 bone marrow biopsies (06/2016, 08/2016 and 04/11/2017).  Marrows have revealed no evidence of malignancy and have been non-diagnostic or inconclusive.  Last bone marrow revealed only mild dysplastic changes with reduced megakaryocytes.  FISH panel was negative.  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  She states that since that time, her counts have been worse. She has received IVIG, Nplate, as well as increasing doses of Promacta for thrombocytopenia without response.    She has received transfusions.  She states that she has typically receives a platelet transfusion weekly on Tuesdays.  She initially received PRBCs every other week but recently weekly.  She  has received G-CSF (Granix) daily during the week.  She states that this will be her 4th week on G-CSF.  ANC has been 0 for more than a month.  She was last admitted to [facility] from 07/21/2017 - 07/25/2017 with neutropenic fever.  Cultures were negative.  She was treated with vancomycin and Cefepime and discharged home on Augmentin.  While hospitalized she received both PRBCs as well as platelets.  She was seen in the medical oncology clinic week.  She had a fever 100.1.  UA was negative.  Augmentin was added to Levaquin prophylaxis.  Labs on 08/01/2017 revealed a hematocrit 22.7, hemoglobin 8.1, MCV 86.3, platelets 17,000 and white count 1100 with an ANC of 100.  She was scheduled for a platelet transfusion tomorrow.  She woke this morning and urinated.  She had some brown foul-smelling vaginal discharge.  Discharge was light brown and at times a little red.  She denies any urgency, frequency or dysuria.  She denies any hematochezia or melena.  She denied any fever.  She presented to the emergency room.  Labs revealed a hematocrit of 21.1, hemoglobin 7.4, MCV 87.1, platelets 5,000 and white count 800.  Urinalysis revealed a large amount of blood and trace leukocytes.   Past Medical History:  Diagnosis Date  . Blood dyscrasia   . Breast cancer ([place])    right, lumpectomy, radiation, chemo  . Breast cancer ([place])    Right, 2007  . Breast cancer ([place]) 06/20/2016   INVASIVE DUCTAL CARCINOMA.  . Collapsed lung 02/14/2017   RIGHT  . Complication of anesthesia    PT STATED AFTER BIL HIP SURGERIES SHE STOPPED BREATHING THE NIGHT  AFTER SURGERY  . COPD (chronic obstructive pulmonary disease) ([place])   . GERD (gastroesophageal reflux disease)   . Headache   . History of chemotherapy   . History of methicillin resistant staphylococcus aureus (MRSA) 2011  . History of radiation therapy   . Hyperlipidemia   . Hypertension   . Liver cancer ([place]) 05/2016  . Lung cancer ([place]) 05/2016  . Osteoarthritis     right hip  . Osteoporosis   . 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 ABDOMINAL HYSTERECTOMY    . BREAST BIOPSY Left 2002   left breast, calcifications  . BREAST BIOPSY Left 06/20/2016   INVASIVE DUCTAL CARCINOMA.  Marland Kitchen BREAST EXCISIONAL BIOPSY Right 2007   positive  . bunion repair    . ESOPHAGOGASTRODUODENOSCOPY (EGD) WITH PROPOFOL N/A 08/05/2016   Procedure: ESOPHAGOGASTRODUODENOSCOPY (EGD) WITH PROPOFOL;  Surgeon: San Jetty, MD;  Location: [facility] ENDOSCOPY;  Service: General;  Laterality: N/A;  . IR FLUORO GUIDE CV LINE RIGHT  07/25/2017  . JOINT REPLACEMENT    . left breast biopsy    . NASAL SINUS SURGERY    . PORTACATH PLACEMENT Right 04/13/2017   Procedure: INSERTION PORT-A-CATH- RIGHT INTERNAL JUGULAR;  Surgeon: Robert Bellow, MD;  Location: [facility] ORS;  Service: General;  Laterality: Right;  . right hip replacement    . SHOULDER SURGERY    . SQUAMOUS CELL CARCINOMA EXCISION     right leg, Dr. Nicole Kindred  . TOTAL HIP ARTHROPLASTY     right    Family History  Problem Relation Age of Onset  . Heart disease Father 88  . Heart disease Mother   . Hyperlipidemia Mother   . Heart disease Brother   . Diabetes Brother   . Diabetes Maternal Grandmother   . Breast cancer Cousin   . Kidney disease Brother     Social History:  reports that she has been smoking cigarettes.  She has a 3.00 pack-year smoking history. She has never used smokeless tobacco. She reports that she drinks alcohol. She reports that she does not use drugs.  She is alone today.  Allergies:  Allergies  Allergen Reactions  . Codeine Anaphylaxis  . Fish-Derived Products Anaphylaxis  . Morphine Sulfate Anaphylaxis    REACTION: anaphylaxis  . Adhesive [Tape]     PAPER TAPE OK TO USE  . Oxycodone     Nausea and vomiting    Medications Prior to Admission  Medication Sig Dispense Refill  . acyclovir (ZOVIRAX) 400 MG tablet Take 1  tablet (400 mg total) by mouth 2 (two) times daily. 60 tablet 3  . ALPRAZolam (XANAX) 0.5 MG tablet Take 1 tablet (0.5 mg total) by mouth every 8 (eight) hours as needed for anxiety. 30 tablet 2  . amoxicillin-clavulanate (AUGMENTIN) 875-125 MG tablet Take 1 tablet by mouth 2 (two) times daily. 20 tablet 0  . cyanocobalamin (,VITAMIN B-12,) 1000 MCG/ML injection Inject 1,000 mcg into the muscle every 30 (thirty) days.    Marland Kitchen dronabinol (MARINOL) 2.5 MG capsule Take 1 capsule (2.5 mg total) by mouth 2 (two) times daily before a meal. 30 capsule 0  . ergocalciferol (VITAMIN D2) 50000 units capsule Take 1 capsule (50,000 Units total) by mouth every Saturday. In the morning. 12 capsule 0  . fexofenadine (ALLEGRA) 180 MG tablet Take 180 mg by mouth every morning.     . fluconazole (DIFLUCAN) 200 MG tablet Take 1 tablet (  200 mg total) by mouth daily. 30 tablet 3  . Glucosamine-Chondroitin (COSAMIN DS PO) Take 1 tablet by mouth daily. In the morning.    Marland Kitchen letrozole (FEMARA) 2.5 MG tablet Take 1 tablet (2.5 mg total) by mouth daily. Once a day. 90 tablet 0  . levofloxacin (LEVAQUIN) 500 MG tablet Take 500 mg by mouth daily.    . metoprolol succinate (TOPROL-XL) 25 MG 24 hr tablet Take 1 tablet (25 mg total) by mouth daily. 90 tablet 3  . pravastatin (PRAVACHOL) 40 MG tablet Take 1 tablet (40 mg total) by mouth daily. 90 tablet 3  . Probiotic Product (PROBIOTIC & ACIDOPHILUS EX ST PO) Take 1 tablet by mouth daily.     . prochlorperazine (COMPAZINE) 10 MG tablet Take 1 tablet (10 mg total) by mouth every 6 (six) hours as needed for nausea or vomiting. 30 tablet 2  . ranitidine (ZANTAC) 75 MG tablet Take 75 mg by mouth daily as needed for heartburn.       Review of Systems: GENERAL:  Featigue.  No fevers, sweats.  Weight loss 25-30 pounds since 05/2016. PERFORMANCE STATUS (ECOG):  1 HEENT:  No visual changes, runny nose, sore throat, mouth sores or tenderness. Lungs: No shortness of breath or cough.  No  hemoptysis. Cardiac:  No chest pain, palpitations, orthopnea, or PND. GI:  Frequent stools (formed).  No nausea, vomiting, diarrhea, constipation, melena or hematochezia. GU:  Vaginal bleeding (old brown).  No urgency, frequency, dysuria, or hematuria. Musculoskeletal:  No back pain.  No joint pain.  No muscle tenderness. Extremities:  No pain or swelling. Skin:  No rashes or skin changes. Neuro:  No headache, numbness or weakness, balance or coordination issues. Endocrine:  No diabetes, thyroid issues, hot flashes or night sweats. Psych:  No mood changes, depression or anxiety. Pain:  No focal pain. Review of systems:  All other systems reviewed and found to be negative.  Physical Exam:  Blood pressure 128/60, pulse 98, temperature 98.4 F (36.9 C), temperature source Oral, resp. rate 18, height 5' 1"  (1.549 m), weight 127 lb 4.8 oz (57.7 kg), SpO2 99 %.  GENERAL:  Well developed, well nourished, woman sitting comfortably on the medical unit in no acute distress. MENTAL STATUS:  Alert and oriented to person, place and time. HEAD:  Short brown hair.  Normocephalic, atraumatic, face symmetric, no Cushingoid features. EYES:  Blue eyes.  Pupils equal round and reactive to light and accomodation.  No conjunctivitis or scleral icterus. ENT:  Right buccal mucosa with two 5-8 mm hemorrhages.  Oropharynx clear without lesion.  Tongue normal.  Mucous membranes moist.  RESPIRATORY:  Clear to auscultation without rales, wheezes or rhonchi. CARDIOVASCULAR:  Regular rate and rhythm without murmur, rub or gallop. CHEST WALL:  Right sided port-a-cath. ABDOMEN:  Soft, non-tender, with active bowel sounds, and no hepatosplenomegaly.  No masses. SKIN:  Few lower extremity petechiae.  No rashes, ulcers or lesions. EXTREMITIES: No edema, no skin discoloration or tenderness.  No palpable cords. LYMPH NODES: No palpable cervical, supraclavicular, axillary or inguinal adenopathy  NEUROLOGICAL:  Unremarkable. PSYCH:  Appropriate.   Results for orders placed or performed during the hospital encounter of 08/05/17 (from the past 48 hour(s))  Comprehensive metabolic panel     Status: Abnormal   Collection Time: 08/05/17 10:52 AM  Result Value Ref Range   Sodium 136 135 - 145 mmol/L   Potassium 2.8 (L) 3.5 - 5.1 mmol/L   Chloride 99 (L) 101 - 111 mmol/L  CO2 28 22 - 32 mmol/L   Glucose, Bld 143 (H) 65 - 99 mg/dL   BUN 15 6 - 20 mg/dL   Creatinine, Ser 0.96 0.44 - 1.00 mg/dL   Calcium 7.8 (L) 8.9 - 10.3 mg/dL   Total Protein 5.8 (L) 6.5 - 8.1 g/dL   Albumin 2.4 (L) 3.5 - 5.0 g/dL   AST 21 15 - 41 U/L   ALT 24 14 - 54 U/L   Alkaline Phosphatase 51 38 - 126 U/L   Total Bilirubin 0.6 0.3 - 1.2 mg/dL   GFR calc non Af Amer 59 (L) >60 mL/min   GFR calc Af Amer >60 >60 mL/min    Comment: (NOTE) The eGFR has been calculated using the CKD EPI equation. This calculation has not been validated in all clinical situations. eGFR's persistently <60 mL/min signify possible Chronic Kidney Disease.    Anion gap 9 5 - 15    Comment: Performed at [facility], [address] [place], [place] [address]  CBC     Status: Abnormal   Collection Time: 08/05/17 10:52 AM  Result Value Ref Range   WBC 0.8 (LL) 3.6 - 11.0 K/uL    Comment: RESULT REPEATED AND VERIFIED CRITICAL RESULT CALLED TO, READ BACK BY AND VERIFIED WITH: CASSIE WELCH ON 08/05/17 AT 1114 QSD    RBC 2.42 (L) 3.80 - 5.20 MIL/uL   Hemoglobin 7.4 (L) 12.0 - 16.0 g/dL   HCT 21.1 (L) 35.0 - 47.0 %   MCV 87.1 80.0 - 100.0 fL   MCH 30.5 26.0 - 34.0 pg   MCHC 35.0 32.0 - 36.0 g/dL   RDW 13.3 11.5 - 14.5 %   Platelets 5 (LL) 150 - 440 K/uL    Comment: RESULT REPEATED AND VERIFIED CRITICAL RESULT CALLED TO, READ BACK BY AND VERIFIED WITH: CASSIE WELCH ON 08/05/17 AT 1114 QSD Performed at [facility], [address], [place], [place] [address]   Urinalysis, Complete w Microscopic     Status: Abnormal    Collection Time: 08/05/17 10:53 AM  Result Value Ref Range   Color, Urine YELLOW (A) YELLOW   APPearance HAZY (A) CLEAR   Specific Gravity, Urine 1.014 1.005 - 1.030   pH 5.0 5.0 - 8.0   Glucose, UA NEGATIVE NEGATIVE mg/dL   Hgb urine dipstick LARGE (A) NEGATIVE   Bilirubin Urine NEGATIVE NEGATIVE   Ketones, ur NEGATIVE NEGATIVE mg/dL   Protein, ur 30 (A) NEGATIVE mg/dL   Nitrite NEGATIVE NEGATIVE   Leukocytes, UA TRACE (A) NEGATIVE   RBC / HPF 21-50 0 - 5 RBC/hpf   WBC, UA 21-50 0 - 5 WBC/hpf   Bacteria, UA NONE SEEN NONE SEEN   Squamous Epithelial / LPF 0-5 0 - 5   Mucus PRESENT    Hyaline Casts, UA PRESENT    Granular Casts, UA PRESENT    Amorphous Crystal PRESENT     Comment: Performed at [facility], [address] [place], [place] [address]  Lactic acid, plasma     Status: None   Collection Time: 08/05/17  2:41 PM  Result Value Ref Range   Lactic Acid, Venous 0.8 0.5 - 1.9 mmol/L    Comment: Performed at [facility], [address] [place], [place] [address]  Type and screen     Status: None (Preliminary result)   Collection Time: 08/05/17  2:41 PM  Result Value Ref Range   ABO/RH(D) PENDING    Antibody Screen PENDING  Sample Expiration      08/08/2017 Performed at [facility], [address] [place], [place] [address]   Prepare RBC     Status: None   Collection Time: 08/05/17  2:44 PM  Result Value Ref Range   Order Confirmation      ORDER PROCESSED BY BLOOD BANK Performed at [facility], [address] [place], [place] [address]   Type and screen     Status: None (Preliminary result)   Collection Time: 08/05/17  3:34 PM  Result Value Ref Range   ABO/RH(D) O POS    Antibody Screen POS    Sample Expiration 08/08/2017    Unit Number [id number]    Blood Component Type RED CELLS,LR    Unit division 00    Status of Unit ALLOCATED    Transfusion Status OK TO TRANSFUSE    Crossmatch Result       COMPATIBLE Performed at [facility], [address] [place], [place] [address]    Unit Number [id number]    Blood Component Type RED CELLS,LR    Unit division 00    Status of Unit ALLOCATED    Transfusion Status OK TO TRANSFUSE    Crossmatch Result COMPATIBLE    Unit Number [id number]    Blood Component Type RED CELLS,LR    Unit division 00    Status of Unit ALLOCATED    Transfusion Status OK TO TRANSFUSE    Crossmatch Result COMPATIBLE   Lactic acid, plasma     Status: None   Collection Time: 08/05/17  4:31 PM  Result Value Ref Range   Lactic Acid, Venous 0.8 0.5 - 1.9 mmol/L    Comment: Performed at [facility], [address] [place], [place] [address]  Hemoglobin and Hematocrit     Status: Abnormal   Collection Time: 08/05/17  4:31 PM  Result Value Ref Range   Hemoglobin 7.4 (L) 12.0 - 16.0 g/dL   HCT 21.6 (L) 35.0 - 47.0 %    Comment: Performed at [facility], [address], [place], [place] [address]   US Pelvic Complete With Transvaginal  Result Date: 08/05/2017 CLINICAL DATA:  Vaginal bleeding for 1 day. Status post hysterectomy. History of right breast cancer. EXAM: TRANSABDOMINAL AND TRANSVAGINAL ULTRASOUND OF PELVIS TECHNIQUE: Both transabdominal and transvaginal ultrasound examinations of the pelvis were performed. Transabdominal technique was performed for global imaging of the pelvis including uterus, ovaries, adnexal regions, and pelvic cul-de-sac. It was necessary to proceed with endovaginal exam following the transabdominal exam to visualize the ovary/adnexa. COMPARISON:  PET 06/03/2017 FINDINGS: Uterus Surgically absent Right ovary Surgically absent Left ovary Not visualized Other findings No abnormal free fluid. IMPRESSION: Hysterectomy and right oophorectomy. No explanation for vaginal bleeding. Left ovary not visualized. Electronically Signed   By: Abigail Miyamoto M.D.   On: 08/05/2017 15:47    Assessment:  The patient is a  72 y.o. 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  She is transfusion dependent.   She presented with scant vaginal bleeding and a platelet count of 5,000.  Plan:   1.  Hematology:  Patient is transfusion dependent.  She is scheduled to received platelets (crossmatched, leukopoor, irradiated, washed) in the morning.  Blood bank contacted regarding patient';s admission.  She is scheduled to receive PRBCs tonight.  Premedications for platelets:  Tylenol 650 mg po, Benadryl 25 mg po, and Solumedrol 40 mg IV. Premedications for PRBCs:  Tylenol 650 mg  po, Benadryl 25 mg po. Patient receives Granix 480 mcg SQ q 24 hours.  Continue neutropenic precautions.  Daily CBC with diff.  2.  Oncology:  Patient has metastatic breast cancer on Femara.  3.  Infectious disease:  Patient was on Augmentin and Levaquin.  Antibiotics switched to Cefepime.  Urine culture sent.  Patient on acyclovir prophylaxis.  Culture if febrile (100.4 or higher).  Granix continues.  4.  Fluids/Electrolytes/Nutrition:  Hypokalemia supplemented.   Thank you for allowing me to participate in Shelly Arias 's care.  I will follow herclosely with you until Dr. Lucrezia Europe return on 08/06/2017.   Lequita Asal, MD  08/05/2017, 7:20 PM

## 2017-08-05 NOTE — ED Notes (Signed)
Pt presents today for a new onset of "brown smelly" discharge coming from vagina. Pt is currently under chemo treatment with [facility]. Pt states platlet are low and is to receive a transfusion tomorrow at cancer center. Pt family is at bedside. Verbal order received to access pt port at this time.

## 2017-08-05 NOTE — ED Notes (Signed)
Lac, BCx2, and Type and Screen sent at this time. Pt is transported to [place] at this time.

## 2017-08-05 NOTE — ED Notes (Signed)
Date and time results received: 08/05/17 1115 (use smartphrase ".now" to insert current time)  Test: Wbc/ Platelets Critical Value: 0.8/5  Name of Provider Notified: Siadecki

## 2017-08-05 NOTE — H&P (Addendum)
[facility] [place] NAME: Shelly Arias    MR#:  [id number]  DATE OF BIRTH:  [birth year]  DATE OF ADMISSION:  08/05/2017  PRIMARY CARE PHYSICIAN: [name], MD   REQUESTING/REFERRING PHYSICIAN: Siadecki  CHIEF COMPLAINT: Vaginal bleeding   Chief Complaint  Patient presents with  . Abdominal Pain  . Vaginal Bleeding    HISTORY OF PRESENT ILLNESS:  Shelly Arias  is a 72 y.o. female with a known history of diastolic breast cancer, myelodysplastic syndrome, recurrent anemia, thrombocytopenia, gets blood transfusion, and irradiated platelet transfusion at cancer center comes in with vaginal bleeding started this morning.  Patient was seen in the cancer center 2 days ago and was given antibiotic for possible UTI.  Urine cultures done 2 days ago at cancer center were negative for infection.  Patient is anyway taking Levaquin, Augmentin.  He comes in because of vaginal bleeding PAST MEDICAL HISTORY:   Past Medical History:  Diagnosis Date  . Blood dyscrasia   . Breast cancer ([place])    right, lumpectomy, radiation, chemo  . Breast cancer ([place])    Right, 2007  . Breast cancer ([place]) 06/20/2016   INVASIVE DUCTAL CARCINOMA.  . Collapsed lung 02/14/2017   RIGHT  . Complication of anesthesia    PT STATED AFTER BIL HIP SURGERIES SHE STOPPED BREATHING THE NIGHT AFTER SURGERY  . COPD (chronic obstructive pulmonary disease) ([place])   . GERD (gastroesophageal reflux disease)   . Headache   . History of chemotherapy   . History of methicillin resistant staphylococcus aureus (MRSA) 2011  . History of radiation therapy   . Hyperlipidemia   . Hypertension   . Liver cancer ([place]) 05/2016  . Lung cancer ([place]) 05/2016  . Osteoarthritis    right hip  . Osteoporosis   . Squamous cell carcinoma    leg, Followed by Dr. Nicole Kindred    PAST SURGICAL HISTOIRY:   Past Surgical History:  Procedure Laterality Date  . ABDOMINAL HYSTERECTOMY    .  BREAST BIOPSY Left 2002   left breast, calcifications  . BREAST BIOPSY Left 06/20/2016   INVASIVE DUCTAL CARCINOMA.  Marland Kitchen BREAST EXCISIONAL BIOPSY Right 2007   positive  . bunion repair    . ESOPHAGOGASTRODUODENOSCOPY (EGD) WITH PROPOFOL N/A 08/05/2016   Procedure: ESOPHAGOGASTRODUODENOSCOPY (EGD) WITH PROPOFOL;  Surgeon: San Jetty, MD;  Location: [facility] ENDOSCOPY;  Service: General;  Laterality: N/A;  . IR FLUORO GUIDE CV LINE RIGHT  07/25/2017  . JOINT REPLACEMENT    . left breast biopsy    . NASAL SINUS SURGERY    . PORTACATH PLACEMENT Right 04/13/2017   Procedure: INSERTION PORT-A-CATH- RIGHT INTERNAL JUGULAR;  Surgeon: Robert Bellow, MD;  Location: [facility] ORS;  Service: General;  Laterality: Right;  . right hip replacement    . SHOULDER SURGERY    . SQUAMOUS CELL CARCINOMA EXCISION     right leg, Dr. Nicole Kindred  . TOTAL HIP ARTHROPLASTY     right    SOCIAL HISTORY:   Social History   Tobacco Use  . Smoking status: Current Some Day Smoker    Packs/day: 0.10    Years: 30.00    Pack years: 3.00    Types: Cigarettes  . Smokeless tobacco: Never Used  Substance Use Topics  . Alcohol use: Yes    Alcohol/week: 0.0 oz    Comment: socially    FAMILY HISTORY:   Family History  Problem Relation Age of Onset  . Heart  disease Father 37  . Heart disease Mother   . Hyperlipidemia Mother   . Heart disease Brother   . Diabetes Brother   . Diabetes Maternal Grandmother   . Breast cancer Cousin   . Kidney disease Brother     DRUG ALLERGIES:   Allergies  Allergen Reactions  . Codeine Anaphylaxis  . Fish-Derived Products Anaphylaxis  . Morphine Sulfate Anaphylaxis    REACTION: anaphylaxis  . Adhesive [Tape]     PAPER TAPE OK TO USE  . Oxycodone     Nausea and vomiting    REVIEW OF SYSTEMS:  CONSTITUTIONAL: No fever, fatigue or weakness.ppears pale  EYES: No blurred or double vision.  EARS, NOSE, AND THROAT: No tinnitus or ear pain.  RESPIRATORY: No cough, shortness  of breath, wheezing or hemoptysis.  CARDIOVASCULAR: No chest pain, orthopnea, edema.  GASTROINTESTINAL: No nausea, vomiting, diarrhea or abdominal pain.  GENITOURINARY: No dysuria, hematuria.  ENDOCRINE: No polyuria, nocturia,  HEMATOLOGY: No anemia, easy bruising or bleeding SKIN: No rash or lesion. MUSCULOSKELETAL: No joint pain or arthritis.   NEUROLOGIC: No tingling, numbness, weakness.  PSYCHIATRY:   No anxiety or depression.   MEDICATIONS AT HOME:   Prior to Admission medications   Medication Sig Start Date End Date Taking? Authorizing Provider  acyclovir (ZOVIRAX) 400 MG tablet Take 1 tablet (400 mg total) by mouth 2 (two) times daily. 06/24/17  Yes [name], MD  ALPRAZolam Duanne Moron) 0.5 MG tablet Take 1 tablet (0.5 mg total) by mouth every 8 (eight) hours as needed for anxiety. 07/30/17  Yes [name], MD  amoxicillin-clavulanate (AUGMENTIN) 875-125 MG tablet Take 1 tablet by mouth 2 (two) times daily. 08/02/17  Yes Burns, Wandra Feinstein, NP  cyanocobalamin (,VITAMIN B-12,) 1000 MCG/ML injection Inject 1,000 mcg into the muscle every 30 (thirty) days.   Yes [provider]  dronabinol (MARINOL) 2.5 MG capsule Take 1 capsule (2.5 mg total) by mouth 2 (two) times daily before a meal. 08/02/17  Yes Verlon Au, NP  ergocalciferol (VITAMIN D2) 50000 units capsule Take 1 capsule (50,000 Units total) by mouth every Saturday. In the morning. 05/25/17  Yes [name], MD  fexofenadine (ALLEGRA) 180 MG tablet Take 180 mg by mouth every morning.    Yes [provider]  fluconazole (DIFLUCAN) 200 MG tablet Take 1 tablet (200 mg total) by mouth daily. 06/24/17  Yes [name], MD  Glucosamine-Chondroitin (COSAMIN DS PO) Take 1 tablet by mouth daily. In the morning.   Yes [provider]  letrozole (FEMARA) 2.5 MG tablet Take 1 tablet (2.5 mg total) by mouth daily. Once a day. 06/20/17  Yes [name], MD  levofloxacin  (LEVAQUIN) 500 MG tablet Take 500 mg by mouth daily.   Yes [provider]  metoprolol succinate (TOPROL-XL) 25 MG 24 hr tablet Take 1 tablet (25 mg total) by mouth daily. 05/02/17  Yes [name], MD  pravastatin (PRAVACHOL) 40 MG tablet Take 1 tablet (40 mg total) by mouth daily. 05/02/17  Yes [name], MD  Probiotic Product (PROBIOTIC & ACIDOPHILUS EX ST PO) Take 1 tablet by mouth daily.    Yes [provider]  prochlorperazine (COMPAZINE) 10 MG tablet Take 1 tablet (10 mg total) by mouth every 6 (six) hours as needed for nausea or vomiting. 07/15/17  Yes Verlon Au, NP  ranitidine (ZANTAC) 75 MG tablet Take 75 mg by mouth daily as needed for heartburn.    Yes  [provider]      VITAL SIGNS:  Blood pressure (!) 134/51, pulse (!) 111, temperature 99.8 F (37.7 C), temperature source Oral, resp. rate 20, height 5\' 1"  (1.549 m), weight 59 kg (130 lb), SpO2 96 %.  PHYSICAL EXAMINATION:  GENERAL:  72 y.o.-year-old patient lying in the bed with no acute distress.  EYES: Pupils equal, round, reactive to light and accommodation. No scleral icterus. Extraocular muscles intact.  HEENT: Head atraumatic, normocephalic. Oropharynx and nasopharynx clear.  NECK:  Supple, no jugular venous distention. No thyroid enlargement, no tenderness.  LUNGS: Normal breath sounds bilaterally, no wheezing, rales,rhonchi or crepitation. No use of accessory muscles of respiration.  CARDIOVASCULAR: S1, S2 normal. No murmurs, rubs, or gallops.  ABDOMEN: Soft, nontender, nondistended. Bowel sounds present. No organomegaly or mass.  EXTREMITIES: No pedal edema, cyanosis, or clubbing.  NEUROLOGIC: Cranial nerves II through XII are intact. Muscle strength 5/5 in all extremities. Sensation intact. Gait not checked.  PSYCHIATRIC: The patient is alert and oriented x 3.  SKIN: No obvious rash, lesion, or ulcer.   LABORATORY PANEL:   CBC Recent Labs  Lab 08/05/17 1052  WBC  0.8*  HGB 7.4*  HCT 21.1*  PLT 5*   ------------------------------------------------------------------------------------------------------------------  Chemistries  Recent Labs  Lab 08/05/17 1052  NA 136  K 2.8*  CL 99*  CO2 28  GLUCOSE 143*  BUN 15  CREATININE 0.96  CALCIUM 7.8*  AST 21  ALT 24  ALKPHOS 51  BILITOT 0.6   ------------------------------------------------------------------------------------------------------------------  Cardiac Enzymes No results for input(s): TROPONINI in the last 168 hours. ------------------------------------------------------------------------------------------------------------------  RADIOLOGY:  No results found.  EKG:   Orders placed or performed in visit on 08/05/17  . EKG 12-Lead    IMPRESSION AND PLAN:  72 year old female patient with history of metastatic breast cancer,, recurrent anemia, thrombocytopenia gets frequent blood transfusion, platelet transfusion comes in with vaginal bleeding, patient noted to have bleeding and she is not sure she has bleeding from vagina or the bladder.  ER physician found that she has old blood in the vaginal vault. 1.  Vaginal bleed, patient may be having atrophic vaginitis, because of her severe thrombocytopenia high risk for active vaginal bleed, I requested apelvic ultrasound, oncology consult, GYN consult. 2.  Severe anemia, thrombocytopenia, platelets are 5, patient supposed to get 1 unit of platelet transfusion at cancer center tomorrow so I spoke with Dr. Rogue Bussing, I also called blood bank to see if we can give platelets while she is admitted and patient should get platelet transfusion tomorro, blood bank aware that patient is admitted and she needs to get platelets tomorrow.  Patient needs washed and irradiated platelets. 3.  Severe hypokalemia, likely secondary to poor p.o. intake.  Patient states she has no appetite.  Replace potassium and IV n.p.o., check magnesium also. 4.  Essential  hypertension, controlled Neutropenia, patient needs to get Granix every day I spoke with Dr Rogue Bussing over the phone, recommended to give Granix every day. 5 /low-grade temperature, urine cultures done 2 days ago at oncology office are negative for infection, because of her neutropenia we will give her IV cefepime while admitted.  All the records are reviewed and case discussed with ED provider. Management plans discussed with the patient, family and they are in agreement.  CODE STATUS: Full code  TOTAL TIME TAKING CARE OF THIS PATIENT: 55 minutes.    Epifanio Lesches M.D on 08/05/2017 at 2:50 PM  Between 7am to 6pm - Pager - [phone]  After  6pm go to [email or web address] - password [email or web address] [facility]  [facility]  Office  [phone]  CC: Primary care physician; [name], MD  Note: This dictation was prepared with Dragon dictation along with smaller phrase technology. Any transcriptional errors that result from this process are unintentional.

## 2017-08-06 ENCOUNTER — Ambulatory Visit: Payer: Medicare Other

## 2017-08-06 ENCOUNTER — Other Ambulatory Visit: Payer: Medicare Other

## 2017-08-06 ENCOUNTER — Telehealth: Payer: Self-pay | Admitting: Internal Medicine

## 2017-08-06 DIAGNOSIS — N952 Postmenopausal atrophic vaginitis: Principal | ICD-10-CM

## 2017-08-06 DIAGNOSIS — E440 Moderate protein-calorie malnutrition: Secondary | ICD-10-CM

## 2017-08-06 LAB — POTASSIUM: POTASSIUM: 3.3 mmol/L — AB (ref 3.5–5.1)

## 2017-08-06 LAB — HEMOGLOBIN AND HEMATOCRIT, BLOOD
HCT: 24.2 % — ABNORMAL LOW (ref 35.0–47.0)
HEMOGLOBIN: 8.3 g/dL — AB (ref 12.0–16.0)

## 2017-08-06 MED ORDER — ENSURE ENLIVE PO LIQD
237.0000 mL | Freq: Two times a day (BID) | ORAL | Status: DC
  Administered 2017-08-06 – 2017-08-07 (×2): 237 mL via ORAL

## 2017-08-06 MED ORDER — SODIUM CHLORIDE 0.9 % IV SOLN
Freq: Once | INTRAVENOUS | Status: AC
  Administered 2017-08-06: 14:00:00 via INTRAVENOUS

## 2017-08-06 NOTE — Consult Note (Signed)
Consult Note  Requesting Attending Physician :  Max Sane, MD Service Requesting Consult : GYN  Assessment/Recommendations:  Shelly Arias is a 72 y.o. female seen in consultation at the request of Max Sane, MD regarding vaginal bleeding.  Active Problems:   Vaginal bleeding   Assessment/Plan:  Vaginal bleeding likely atrophic vaginitis and spontaneous vaginal bleeding secondary to blood dyscrasia and extremely low platelets. (Pt describes a recent nose bleed - likely similar circumstances.)  This is unlikely to be the source of her anemia as her bleeding has only been present for 2 days and is very light.  Of significant note-patient has previously had a hysterectomy for what she describes as cervical cancer although this diagnosis has not been established by me.  (I believe this diagnosis is unlikely and irrespective she has had normal Pap smears for many years following her hysterectomy )  Should a diagnosis of previous cervical cancer be confirmed, close follow-up after her current hospitalization and stabilization should include a Pap of the vaginal cuff.  I have reviewed her pelvic ultrasound and I believe no further GYN work-up is necessary at this time.  (I had a direct discussion with Dr. Manuella Ghazi regarding the above)   HPI:   Patient with a history of breast cancer and subsequent recurrence and also diagnosed with MDS resulting in anemia and thrombocytopenia. Patient presented to the emergency department complaining of spontaneous vaginal bleeding which was as heavy as amenses for 1 day.  She reports that today her bleeding is minimal and involves only brown spotting.  Obstetric History:  OB History  Gravida Para Term Preterm AB Living  2            SAB TAB Ectopic Multiple Live Births          2    # Outcome Date GA Lbr Len/2nd Weight Sex Delivery Anes PTL Lv  2 Gravida           1 Gravida             Obstetric Comments  1st Menstrual Cycle:  13  1st  Pregnancy: 27     Social History: Social History   Socioeconomic History  . Marital status: Married    Spouse name: Not on file  . Number of children: Not on file  . Years of education: Not on file  . Highest education level: Not on file  Occupational History  . Not on file  Social Needs  . Financial resource strain: Not hard at all  . Food insecurity:    Worry: Never true    Inability: Never true  . Transportation needs:    Medical: No    Non-medical: No  Tobacco Use  . Smoking status: Current Some Day Smoker    Packs/day: 0.10    Years: 30.00    Pack years: 3.00    Types: Cigarettes  . Smokeless tobacco: Never Used  Substance and Sexual Activity  . Alcohol use: Yes    Alcohol/week: 0.0 oz    Comment: socially  . Drug use: No  . Sexual activity: Never  Lifestyle  . Physical activity:    Days per week: 4 days    Minutes per session: 30 min  . Stress: Not at all  Relationships  . Social connections:    Talks on phone: Three times a week    Gets together: Twice a week    Attends religious service: More than 4 times per year  Active member of club or organization: No    Attends meetings of clubs or organizations: Never    Relationship status: Married  Other Topics Concern  . Not on file  Social History Narrative  . Not on file    Family History: family history includes Breast cancer in her cousin; Diabetes in her brother and maternal grandmother; Heart disease in her brother and mother; Heart disease (age of onset: 42) in her father; Hyperlipidemia in her mother; Kidney disease in her brother.    Objective :  Vital signs in last 24 hours: Temp:  [98.4 F (36.9 C)-99.7 F (37.6 C)] 99 F (37.2 C) (05/28 0854) Pulse Rate:  [91-103] 103 (05/28 0854) Resp:  [18-30] 18 (05/28 0423) BP: (114-162)/(60-81) 144/71 (05/28 0854) SpO2:  [98 %-100 %] 98 % (05/28 0854) Weight:  [127 lb 4.8 oz (57.7 kg)] 127 lb 4.8 oz (57.7 kg) (05/27 1620)  Intake/Output last  3 shifts: I/O last 3 completed shifts: In: 784 [P.O.:240; Blood:344; IV Piggyback:200] Out: 700 [Urine:700]   Physical Exam: Pelvic ultrasound reviewed.  Finis Bud, M.D. 08/06/2017 11:21 AM

## 2017-08-06 NOTE — Plan of Care (Signed)
  Problem: Education: Goal: Knowledge of General Education information will improve Outcome: Progressing Goal: Knowledge of General Education information will improve Outcome: Progressing   Problem: Health Behavior/Discharge Planning: Goal: Ability to manage health-related needs will improve Outcome: Progressing   Problem: Clinical Measurements: Goal: Will remain free from infection Outcome: Progressing Goal: Diagnostic test results will improve Outcome: Progressing Goal: Respiratory complications will improve Outcome: Progressing Goal: Cardiovascular complication will be avoided Outcome: Progressing Goal: Ability to maintain clinical measurements within normal limits will improve Outcome: Progressing Goal: Will remain free from infection Outcome: Progressing Goal: Respiratory complications will improve Outcome: Progressing Goal: Cardiovascular complication will be avoided Outcome: Progressing   Problem: Nutrition: Goal: Adequate nutrition will be maintained Outcome: Progressing   Problem: Activity: Goal: Risk for activity intolerance will decrease Outcome: Progressing   Problem: Coping: Goal: Level of anxiety will decrease Outcome: Progressing   Problem: Elimination: Goal: Will not experience complications related to bowel motility Outcome: Progressing   Problem: Pain Managment: Goal: General experience of comfort will improve Outcome: Progressing   Problem: Safety: Goal: Ability to remain free from injury will improve Outcome: Progressing

## 2017-08-06 NOTE — Progress Notes (Signed)
[facility] at [place] NAME: Shelly Arias    MR#:  [id number]  DATE OF BIRTH:  [birth year]  SUBJECTIVE:  CHIEF COMPLAINT:   Chief Complaint  Patient presents with  . Abdominal Pain  . Vaginal Bleeding  waiting for platelet transfusion, no complaints, talkative REVIEW OF SYSTEMS:  Review of Systems  Constitutional: Negative for chills, fever and weight loss.  HENT: Negative for nosebleeds and sore throat.   Eyes: Negative for blurred vision.  Respiratory: Negative for cough, shortness of breath and wheezing.   Cardiovascular: Negative for chest pain, orthopnea, leg swelling and PND.  Gastrointestinal: Negative for abdominal pain, constipation, diarrhea, heartburn, nausea and vomiting.  Genitourinary: Negative for dysuria and urgency.  Musculoskeletal: Negative for back pain.  Skin: Negative for rash.  Neurological: Negative for dizziness, speech change, focal weakness and headaches.  Endo/Heme/Allergies: Does not bruise/bleed easily.  Psychiatric/Behavioral: Negative for depression.    DRUG ALLERGIES:   Allergies  Allergen Reactions  . Codeine Anaphylaxis  . Fish-Derived Products Anaphylaxis  . Morphine Sulfate Anaphylaxis    REACTION: anaphylaxis  . Adhesive [Tape]     PAPER TAPE OK TO USE  . Oxycodone     Nausea and vomiting   VITALS:  Blood pressure (!) 151/76, pulse 94, temperature 98 F (36.7 C), temperature source Oral, resp. rate 18, height 5\' 1"  (1.549 m), weight 57.7 kg (127 lb 4.8 oz), SpO2 98 %. PHYSICAL EXAMINATION:  Physical Exam  Constitutional: She is oriented to person, place, and time.  HENT:  Head: Normocephalic and atraumatic.  Eyes: Pupils are equal, round, and reactive to light. Conjunctivae and EOM are normal.  Neck: Normal range of motion. Neck supple. No tracheal deviation present. No thyromegaly present.  Cardiovascular: Normal rate, regular rhythm and normal heart sounds.  Pulmonary/Chest: Effort  normal and breath sounds normal. No respiratory distress. She has no wheezes. She exhibits no tenderness.  Abdominal: Soft. Bowel sounds are normal. She exhibits no distension. There is no tenderness.  Musculoskeletal: Normal range of motion.  Neurological: She is alert and oriented to person, place, and time. No cranial nerve deficit.  Skin: Skin is warm and dry. No rash noted.   LABORATORY PANEL:  Female CBC Recent Labs  Lab 08/05/17 1052  08/06/17 0030  WBC 0.8*  --   --   HGB 7.4*   < > 8.3*  HCT 21.1*   < > 24.2*  PLT 5*  --   --    < > = values in this interval not displayed.   ------------------------------------------------------------------------------------------------------------------ Chemistries  Recent Labs  Lab 08/05/17 1052 08/06/17 1659  NA 136  --   K 2.8* 3.3*  CL 99*  --   CO2 28  --   GLUCOSE 143*  --   BUN 15  --   CREATININE 0.96  --   CALCIUM 7.8*  --   AST 21  --   ALT 24  --   ALKPHOS 51  --   BILITOT 0.6  --    RADIOLOGY:  No results found. ASSESSMENT AND PLAN:  72 year old female patient with history of metastatic breast cancer,, recurrent anemia, thrombocytopenia gets frequent blood transfusion, platelet transfusion comes in with vaginal bleeding, patient noted to have bleeding and she is not sure she has bleeding from vagina or the bladder.  ER physician found that she has old blood in the vaginal vault.  1.  Vaginal bleed, patient may be having atrophic vaginitis, because  of her severe thrombocytopenia high risk for active vaginal bleed - d/w OB  2.  Severe anemia, thrombocytopenia, platelets are 5, patient supposed to get 1 unit of platelet transfusion today as soon as it arrives, Patient needs washed and irradiated platelets.  3.  Severe hypokalemia, likely secondary to poor p.o. Intake: replete and recheck  4.  Essential hypertension, controlled  5 low-grade temperature, urine cultures done 2 days ago at oncology office are negative  for infection, because of her neutropenia - monitor fever curve, hold off Abx       All the records are reviewed and case discussed with Care Management/Social Worker. Management plans discussed with the patient, nursing and they are in agreement.  CODE STATUS: Full Code  TOTAL TIME TAKING CARE OF THIS PATIENT: 35 minutes.   More than 50% of the time was spent in counseling/coordination of care: YES  POSSIBLE D/C IN 1 DAYS, DEPENDING ON CLINICAL CONDITION.   Max Sane M.D on 08/06/2017 at 7:56 PM  Between 7am to 6pm - Pager - [phone]  After 6pm go to [email or web address] - Technical brewer [facility]  Office  [phone]  CC: Primary care physician; [name], MD  Note: This dictation was prepared with Dragon dictation along with smaller phrase technology. Any transcriptional errors that result from this process are unintentional.

## 2017-08-06 NOTE — Telephone Encounter (Signed)
Spoke to blood bank-cross matches platelets should arrive today around 1:30; will transfuse in hospital.

## 2017-08-06 NOTE — Progress Notes (Signed)
Initial Nutrition Assessment  DOCUMENTATION CODES:   Non-severe (moderate) malnutrition in context of chronic illness  INTERVENTION:   - Ensure Enlive po BID, each supplement provides 350 kcal and 20 grams of protein (chocolate). RD provided an Ensure Enlive to pt at time of visit.  - Encourage PO intake  NUTRITION DIAGNOSIS:   Moderate Malnutrition related to chronic illness, cancer and cancer related treatments, poor appetite(metastatic breast cancer currently undergoing treatment with Femara, COPD) as evidenced by mild fat depletion, mild muscle depletion, moderate muscle depletion, percent weight loss(21.3% weight loss in 1 year).  GOAL:   Patient will meet greater than or equal to 90% of their needs  MONITOR:   PO intake, Supplement acceptance, Weight trends, Labs  REASON FOR ASSESSMENT:   Malnutrition Screening Tool    ASSESSMENT:   72 year old female who presented to the ED with abdominal pain and vaginal bleeding. 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 Pt is transfusion dependent.  Spoke with pt at bedside who reports she "can't eat" and that "the smell and taste of food are gone." Pt states that his has been going on for about 1 year.  Pt denies difficulty chewing or swallowing but states that her mouth "doesn't have enough moisture." Pt reports being familiar with "tricks to make food taste better" and that she utilizes these on a daily basis.  Pt states that her doctor has prescribed appetite stimulants but that she hasn't been taking them consistently.  Pt states that she eats 3 meals daily. A "meal" may include a Boost or sherbet and ginger ale. Pt states she ate half of a grilled pork chop last night for dinner. Pt endorses drinking 1 Boost oral nutrition supplement daily. RD  encouraged pt to increase to 2 Boost daily to promote adequate calorie and protein intake to prevent future weight loss.  Pt reports that she has gradually been losing weight over the past year "since my diagnosis." Pt reports her UBW as 163 lbs. Per weight history in chart, pt has lost 34 lbs over the past year (161 lbs to 127 lbs). This is a 21.3% weight loss which is significant for timeframe.  Pt reports that she has appointments at [facility] daily. Encouraged pt to reach out to [facility] RD.  Meal Completion: 50-100%  Medications reviewed and include: 10 mg Pepcid daily, 2.5 mg Femara daily  Labs reviewed: hemoglobin 8.3 (L), HCT 24.2 (L), potassium 2.8 (L)  NUTRITION - FOCUSED PHYSICAL EXAM:    Most Recent Value  Orbital Region  Mild depletion  Upper Arm Region  Mild depletion  Thoracic and Lumbar Region  Mild depletion  Buccal Region  Mild depletion  Temple Region  Mild depletion  Clavicle Bone Region  Mild depletion  Clavicle and Acromion Bone Region  Mild depletion  Scapular Bone Region  Mild depletion  Dorsal Hand  Moderate depletion  Patellar Region  Moderate depletion  Anterior Thigh Region  Mild depletion  Posterior Calf Region  Moderate depletion  Edema (RD Assessment)  None  Hair  Reviewed  Eyes  Reviewed  Mouth  Reviewed  Skin  Reviewed  Nails  Reviewed       Diet Order:   Diet Order           Diet regular Room service appropriate? Yes; Fluid consistency: Thin  Diet effective now  EDUCATION NEEDS:   No education needs have been identified at this time  Skin:  Skin Assessment: Reviewed RN Assessment  Last BM:  08/06/17  Height:   Ht Readings from Last 1 Encounters:  08/05/17 5\' 1"  (1.549 m)    Weight:   Wt Readings from Last 1 Encounters:  08/05/17 127 lb 4.8 oz (57.7 kg)    Ideal Body Weight:  47.7 kg  BMI:  Body mass index is 24.05 kg/m.  Estimated Nutritional Needs:   Kcal:  1450-1650 kcal/day (25-29  kcal/kg)  Protein:  70-85 grams/day (1.2-1.4 g/kg)  Fluid:  1.5-1.7 L/day    Gaynell Face, MS, RD, LDN Pager: [phone] Weekend/After Hours: [phone]

## 2017-08-07 ENCOUNTER — Other Ambulatory Visit: Payer: Self-pay | Admitting: Internal Medicine

## 2017-08-07 ENCOUNTER — Inpatient Hospital Stay: Payer: Medicare Other

## 2017-08-07 ENCOUNTER — Other Ambulatory Visit: Payer: Self-pay | Admitting: *Deleted

## 2017-08-07 DIAGNOSIS — D696 Thrombocytopenia, unspecified: Secondary | ICD-10-CM

## 2017-08-07 DIAGNOSIS — N939 Abnormal uterine and vaginal bleeding, unspecified: Secondary | ICD-10-CM

## 2017-08-07 DIAGNOSIS — R630 Anorexia: Secondary | ICD-10-CM

## 2017-08-07 LAB — BASIC METABOLIC PANEL
Anion gap: 10 (ref 5–15)
BUN: 16 mg/dL (ref 6–20)
CHLORIDE: 99 mmol/L — AB (ref 101–111)
CO2: 25 mmol/L (ref 22–32)
CREATININE: 0.69 mg/dL (ref 0.44–1.00)
Calcium: 8 mg/dL — ABNORMAL LOW (ref 8.9–10.3)
GFR calc non Af Amer: >60 mL/min (ref >60)
Glucose, Bld: 203 mg/dL — ABNORMAL HIGH (ref 65–99)
Potassium: 3.8 mmol/L (ref 3.5–5.1)
Sodium: 134 mmol/L — ABNORMAL LOW (ref 135–145)

## 2017-08-07 LAB — BPAM PLATELET PHERESIS
BLOOD PRODUCT EXPIRATION DATE: 201905281430
ISSUE DATE / TIME: 201905281415
UNIT TYPE AND RH: 5100

## 2017-08-07 LAB — CBC
HEMATOCRIT: 26.4 % — AB (ref 35.0–47.0)
HEMOGLOBIN: 9.3 g/dL — AB (ref 12.0–16.0)
MCH: 31.2 pg (ref 26.0–34.0)
MCHC: 35.3 g/dL (ref 32.0–36.0)
MCV: 88.3 fL (ref 80.0–100.0)
Platelets: 16 10*3/uL — CL (ref 150–440)
RBC: 2.99 MIL/uL — ABNORMAL LOW (ref 3.80–5.20)
RDW: 13.3 % (ref 11.5–14.5)
WBC: 0.8 10*3/uL — CL (ref 3.6–11.0)

## 2017-08-07 LAB — PREPARE PLATELET PHERESIS: Unit division: 0

## 2017-08-07 LAB — MAGNESIUM: Magnesium: 1.4 mg/dL — ABNORMAL LOW (ref 1.7–2.4)

## 2017-08-07 LAB — PHOSPHORUS: PHOSPHORUS: 2.7 mg/dL (ref 2.5–4.6)

## 2017-08-07 MED ORDER — TBO-FILGRASTIM 480 MCG/0.8ML ~~LOC~~ SOSY
480.0000 ug | PREFILLED_SYRINGE | Freq: Once | SUBCUTANEOUS | Status: AC
  Administered 2017-08-07: 480 ug via SUBCUTANEOUS
  Filled 2017-08-07 (×2): qty 0.8

## 2017-08-07 MED ORDER — TRANEXAMIC ACID 650 MG PO TABS: 1300.0000 mg | ORAL_TABLET | Freq: Three times a day (TID) | ORAL | 2 refills | Status: DC

## 2017-08-07 MED ORDER — TRANEXAMIC ACID 650 MG PO TABS
1300.0000 mg | ORAL_TABLET | Freq: Three times a day (TID) | ORAL | Status: DC
  Administered 2017-08-07: 09:00:00 1300 mg via ORAL
  Filled 2017-08-07 (×3): qty 2

## 2017-08-07 MED ORDER — MAGNESIUM SULFATE 4 GM/100ML IV SOLN
4.0000 g | Freq: Once | INTRAVENOUS | Status: AC
  Administered 2017-08-07: 12:00:00 4 g via INTRAVENOUS
  Filled 2017-08-07: qty 100

## 2017-08-07 NOTE — Progress Notes (Signed)
Shelly Arias   DOB:[birth year]   QM#:[id number]    Subjective: No fevers no chills.  Appetite is poor.  Continues to have vaginal spotting.  Patient received platelet transfusion.  No nausea no vomiting.  No diarrhea no blood in stools or black stools.  Objective:  Vitals:   08/07/17 0338 08/07/17 0903  BP: (!) 154/74 (!) 147/73  Pulse: 92 92  Resp: 18 16  Temp: 97.6 F (36.4 C) 97.6 F (36.4 C)  SpO2: 99% 99%     Intake/Output Summary (Last 24 hours) at 08/07/2017 1740 Last data filed at 08/07/2017 0955 Gross per 24 hour  Intake 480 ml  Output -  Net 480 ml    GENERAL Alert, no distress and comfortable.  Accompanied by husband EYES: Positive for pallor OROPHARYNX: no thrush or ulceration. NECK: supple, no masses felt LYMPH:  no palpable lymphadenopathy in the cervical, axillary or inguinal regions LUNGS: decreased breath sounds to auscultation at bases and  No wheeze or crackles HEART/CVS: regular rate & rhythm and no murmurs; No lower extremity edema ABDOMEN: abdomen soft, tender  on deep palpation. and normal bowel sounds Musculoskeletal:no cyanosis of digits and no clubbing  PSYCH: alert & oriented x 3 with fluent speech NEURO: no focal motor/sensory deficits SKIN:  no rashes or significant lesions   Labs:  Lab Results  Component Value Date   WBC 0.8 (LL) 08/07/2017   HGB 9.3 (L) 08/07/2017   HCT 26.4 (L) 08/07/2017   MCV 88.3 08/07/2017   PLT 16 (LL) 08/07/2017   NEUTROABS 0.1 (L) 08/01/2017    Lab Results  Component Value Date   NA 134 (L) 08/07/2017   K 3.8 08/07/2017   CL 99 (L) 08/07/2017   CO2 25 08/07/2017    Studies:  No results found.  Assessment & Plan:   #72 year old female patient with possible MDS-currently severely pancytopenic with vaginal bleeding  #Severe pancytopenia-underlying possible MDS/side effect of Dacogen approximately 8 weeks ago.  Discussed at length that only way to differentiate between the 2 entities would be  repeating a bone marrow biopsy.  If patient is hypercellular-would recommend repeating Dacogen; if hypocellular-would then continue supportive therapy.  Patient not too keen at this time.  Also discussed with Dr. Royce Macadamia at [facility]  #Severe thrombocytopenia-platelets 16 status post transfusion yesterday; patient will likely need platelet transfusion on 5/31; will arrange outpatient.  Also continue Granix.  #Recommend continued antibiotic prophylaxis at discharge.  #Vaginal bleeding-currently spotting secondary to atrophic vaginitis-postmenopausal status Femara.  Likely secondary to low platelets; add Lysteda 650 TID x3 days.   #Metastatic breast cancer currently stable continue-Femara.  #Overall prognosis is poor -this was discussed with husband and patient detail.  Her biggest risk of death would be from infections; also possible internal bleeding given the severe thrombocytopenia.  For now patient is is interested in continuing transfusions.  Long discussion the patient and husband regarding CODE STATUS; I would recommend no CPR given her overall poor prognosis/incurable disease.  No decisions made yet.  Also discussed with Dr. Brigitte Pulse.  # 40 minutes face-to-face with the patient discussing the above plan of care; more than 50% of time spent on prognosis/ natural history; counseling and coordination.   [name], MD 08/07/2017  5:40 PM

## 2017-08-07 NOTE — Discharge Instructions (Signed)
Thrombocytopenia Thrombocytopenia means that you have a low number of platelets in your blood. Platelets are tiny cells in the blood. When you bleed, they clump together at the cut or injury to stop the bleeding. This is called blood clotting. Not having enough platelets can cause bleeding problems. Follow these instructions at home: General instructions  Check your skin and inside your mouth for bruises or blood as told by your doctor.  Check to see if there is blood in your spit (sputum), pee (urine), and poop (stool). Do this as told by your doctor.  Ask your doctor if you can drink alcohol.  Take over-the-counter and prescription medicines only as told by your doctor.  Tell all of your doctors that you have this condition. Be sure to tell your dentist and eye doctor too. Activity  Do not do activities that can cause bumps or bruises until your doctor says it is okay.  Be careful not to cut yourself: ? When you shave. ? When you use scissors, needles, knives, or other tools.  Be careful not to burn yourself: ? When you use an iron. ? When you cook. Contact a doctor if:  You have bruises and you do not know why. Get help right away if:  You are bleeding anywhere on your body.  You have blood in your spit, pee, or poop. This information is not intended to replace advice given to you by your health care provider. Make sure you discuss any questions you have with your health care provider. Document Released: 02/15/2011 Document Revised: 10/30/2015 Document Reviewed: 08/30/2014 Elsevier Interactive Patient Education  [employer].

## 2017-08-07 NOTE — Progress Notes (Signed)
Pt D/C to home with husband. VSS. Education completed. All questions answered. Port needle removed. Pt tolerated well. Wheeled out by Psychologist, occupational.

## 2017-08-07 NOTE — Progress Notes (Signed)
   08/07/17 1100  Clinical Encounter Type  Visited With Patient  Visit Type Follow-up  Referral From Nurse  Consult/Referral To Chaplain  Spiritual Encounters  Spiritual Needs Prayer;Emotional   [place] made a follow up visit with PT. PT presented to be in a good mood. RN came in to administer medication. [place] will follow up again later today.

## 2017-08-08 ENCOUNTER — Inpatient Hospital Stay: Payer: Medicare Other

## 2017-08-08 ENCOUNTER — Other Ambulatory Visit: Payer: Self-pay

## 2017-08-08 DIAGNOSIS — K5900 Constipation, unspecified: Secondary | ICD-10-CM | POA: Diagnosis not present

## 2017-08-08 DIAGNOSIS — Z170 Estrogen receptor positive status [ER+]: Secondary | ICD-10-CM

## 2017-08-08 DIAGNOSIS — Z9221 Personal history of antineoplastic chemotherapy: Secondary | ICD-10-CM | POA: Diagnosis not present

## 2017-08-08 DIAGNOSIS — R51 Headache: Secondary | ICD-10-CM | POA: Diagnosis not present

## 2017-08-08 DIAGNOSIS — D701 Agranulocytosis secondary to cancer chemotherapy: Secondary | ICD-10-CM | POA: Diagnosis not present

## 2017-08-08 DIAGNOSIS — C787 Secondary malignant neoplasm of liver and intrahepatic bile duct: Secondary | ICD-10-CM | POA: Diagnosis not present

## 2017-08-08 DIAGNOSIS — E46 Unspecified protein-calorie malnutrition: Secondary | ICD-10-CM | POA: Diagnosis not present

## 2017-08-08 DIAGNOSIS — R5383 Other fatigue: Secondary | ICD-10-CM | POA: Diagnosis not present

## 2017-08-08 DIAGNOSIS — D469 Myelodysplastic syndrome, unspecified: Secondary | ICD-10-CM | POA: Diagnosis not present

## 2017-08-08 DIAGNOSIS — F17210 Nicotine dependence, cigarettes, uncomplicated: Secondary | ICD-10-CM | POA: Diagnosis not present

## 2017-08-08 DIAGNOSIS — D696 Thrombocytopenia, unspecified: Secondary | ICD-10-CM | POA: Diagnosis not present

## 2017-08-08 DIAGNOSIS — R630 Anorexia: Secondary | ICD-10-CM | POA: Diagnosis not present

## 2017-08-08 DIAGNOSIS — J449 Chronic obstructive pulmonary disease, unspecified: Secondary | ICD-10-CM | POA: Diagnosis not present

## 2017-08-08 DIAGNOSIS — Z8614 Personal history of Methicillin resistant Staphylococcus aureus infection: Secondary | ICD-10-CM | POA: Diagnosis not present

## 2017-08-08 DIAGNOSIS — I10 Essential (primary) hypertension: Secondary | ICD-10-CM | POA: Diagnosis not present

## 2017-08-08 DIAGNOSIS — K219 Gastro-esophageal reflux disease without esophagitis: Secondary | ICD-10-CM | POA: Diagnosis not present

## 2017-08-08 DIAGNOSIS — E785 Hyperlipidemia, unspecified: Secondary | ICD-10-CM | POA: Diagnosis not present

## 2017-08-08 DIAGNOSIS — D693 Immune thrombocytopenic purpura: Secondary | ICD-10-CM

## 2017-08-08 DIAGNOSIS — K1230 Oral mucositis (ulcerative), unspecified: Secondary | ICD-10-CM | POA: Diagnosis not present

## 2017-08-08 DIAGNOSIS — C50811 Malignant neoplasm of overlapping sites of right female breast: Secondary | ICD-10-CM

## 2017-08-08 DIAGNOSIS — R0602 Shortness of breath: Secondary | ICD-10-CM | POA: Diagnosis not present

## 2017-08-08 DIAGNOSIS — M810 Age-related osteoporosis without current pathological fracture: Secondary | ICD-10-CM | POA: Diagnosis not present

## 2017-08-08 DIAGNOSIS — M1990 Unspecified osteoarthritis, unspecified site: Secondary | ICD-10-CM | POA: Diagnosis not present

## 2017-08-08 DIAGNOSIS — D649 Anemia, unspecified: Secondary | ICD-10-CM | POA: Diagnosis not present

## 2017-08-08 DIAGNOSIS — C7800 Secondary malignant neoplasm of unspecified lung: Secondary | ICD-10-CM | POA: Diagnosis not present

## 2017-08-08 LAB — BPAM RBC
BLOOD PRODUCT EXPIRATION DATE: 201906112359
BLOOD PRODUCT EXPIRATION DATE: 201906202359
Blood Product Expiration Date: 201906132359
ISSUE DATE / TIME: 201905272031
Unit Type and Rh: 5100
Unit Type and Rh: 5100
Unit Type and Rh: 5100

## 2017-08-08 LAB — CBC WITH DIFFERENTIAL/PLATELET
BASOS ABS: 0 10*3/uL (ref 0–0.1)
BASOS PCT: 1 %
Eosinophils Absolute: 0 10*3/uL (ref 0–0.7)
Eosinophils Relative: 0 %
HEMATOCRIT: 28 % — AB (ref 35.0–47.0)
HEMOGLOBIN: 9.8 g/dL — AB (ref 12.0–16.0)
Lymphocytes Relative: 80 %
Lymphs Abs: 1 10*3/uL (ref 1.0–3.6)
MCH: 31.5 pg (ref 26.0–34.0)
MCHC: 35 g/dL (ref 32.0–36.0)
MCV: 89.9 fL (ref 80.0–100.0)
Monocytes Absolute: 0 10*3/uL — ABNORMAL LOW (ref 0.2–0.9)
Monocytes Relative: 0 %
NEUTROS ABS: 0.2 10*3/uL — AB (ref 1.4–6.5)
NEUTROS PCT: 19 %
Platelets: 13 10*3/uL — CL (ref 150–400)
RBC: 3.11 MIL/uL — ABNORMAL LOW (ref 3.80–5.20)
RDW: 13.8 % (ref 11.5–14.5)
WBC: 1.3 10*3/uL — CL (ref 3.6–11.0)

## 2017-08-08 LAB — TYPE AND SCREEN
ABO/RH(D): O POS
ANTIBODY SCREEN: POSITIVE
DONOR AG TYPE: NEGATIVE
Unit division: 0
Unit division: 0
Unit division: 0

## 2017-08-08 LAB — SAMPLE TO BLOOD BANK

## 2017-08-08 MED ORDER — TBO-FILGRASTIM 480 MCG/0.8ML ~~LOC~~ SOSY
480.0000 ug | PREFILLED_SYRINGE | Freq: Once | SUBCUTANEOUS | Status: AC
  Administered 2017-08-08: 480 ug via SUBCUTANEOUS

## 2017-08-09 ENCOUNTER — Inpatient Hospital Stay: Payer: Medicare Other

## 2017-08-09 ENCOUNTER — Telehealth: Payer: Self-pay

## 2017-08-09 VITALS — BP 134/81 | HR 118 | Temp 98.0°F | Resp 20

## 2017-08-09 DIAGNOSIS — C50811 Malignant neoplasm of overlapping sites of right female breast: Secondary | ICD-10-CM

## 2017-08-09 DIAGNOSIS — C7800 Secondary malignant neoplasm of unspecified lung: Secondary | ICD-10-CM | POA: Diagnosis not present

## 2017-08-09 DIAGNOSIS — D649 Anemia, unspecified: Secondary | ICD-10-CM | POA: Diagnosis not present

## 2017-08-09 DIAGNOSIS — D693 Immune thrombocytopenic purpura: Secondary | ICD-10-CM

## 2017-08-09 DIAGNOSIS — Z170 Estrogen receptor positive status [ER+]: Secondary | ICD-10-CM

## 2017-08-09 DIAGNOSIS — D469 Myelodysplastic syndrome, unspecified: Secondary | ICD-10-CM | POA: Diagnosis not present

## 2017-08-09 DIAGNOSIS — D696 Thrombocytopenia, unspecified: Secondary | ICD-10-CM

## 2017-08-09 DIAGNOSIS — C787 Secondary malignant neoplasm of liver and intrahepatic bile duct: Secondary | ICD-10-CM | POA: Diagnosis not present

## 2017-08-09 MED ORDER — TBO-FILGRASTIM 480 MCG/0.8ML ~~LOC~~ SOSY
480.0000 ug | PREFILLED_SYRINGE | Freq: Once | SUBCUTANEOUS | Status: AC
  Administered 2017-08-09: 480 ug via SUBCUTANEOUS

## 2017-08-09 MED ORDER — METHYLPREDNISOLONE SODIUM SUCC 125 MG IJ SOLR
40.0000 mg | Freq: Once | INTRAMUSCULAR | Status: AC
  Administered 2017-08-09: 40 mg via INTRAVENOUS
  Filled 2017-08-09: qty 2

## 2017-08-09 MED ORDER — SODIUM CHLORIDE 0.9 % IV SOLN
250.0000 mL | Freq: Once | INTRAVENOUS | Status: AC
  Administered 2017-08-09: 250 mL via INTRAVENOUS
  Filled 2017-08-09: qty 250

## 2017-08-09 MED ORDER — DIPHENHYDRAMINE HCL 25 MG PO CAPS
25.0000 mg | ORAL_CAPSULE | Freq: Once | ORAL | Status: AC
  Administered 2017-08-09: 25 mg via ORAL
  Filled 2017-08-09: qty 1

## 2017-08-09 MED ORDER — ACETAMINOPHEN 325 MG PO TABS
650.0000 mg | ORAL_TABLET | Freq: Once | ORAL | Status: AC
  Administered 2017-08-09: 650 mg via ORAL
  Filled 2017-08-09: qty 2

## 2017-08-09 NOTE — Telephone Encounter (Signed)
Copied from [place] [id number]. Topic: Inquiry >> Aug 09, 2017  3:06 PM Pricilla Handler wrote: Reason for CRM: Dr. Rogue Bussing with the [facility] [phone] Cell Phone) called wanting to speak with Dr. Caryl Bis concerning this patient. Please ask Dr. Caryl Bis to call Dr. Rogue Bussing at his cell phone: [phone].       Thank You!!!

## 2017-08-09 NOTE — Telephone Encounter (Signed)
Spoke with Dr. Rogue Bussing from the cancer center.  He wanted to reach out and speak with me regarding the cardiac work-up for the patient's abnormal EKG recently.  He noted that the patient was not too keen to have the echo done given her neutropenia issues.  She would have to go to the hospital to have this done.  I think it is reasonable to hold off on it given her other medical issues at this time.  He verbalized understanding and agreement.

## 2017-08-09 NOTE — Discharge Summary (Signed)
[facility] NAME: Shelly Arias    MR#:  [id number]  DATE OF BIRTH:  [birth year]  DATE OF ADMISSION:  08/05/2017   ADMITTING PHYSICIAN: Epifanio Lesches, MD  DATE OF DISCHARGE: 08/07/2017  3:15 PM  PRIMARY CARE PHYSICIAN: [name], MD   ADMISSION DIAGNOSIS:  Vaginal bleeding [N93.9] Thrombocytopenia ([place]) [D69.6] DISCHARGE DIAGNOSIS:  Active Problems:   Vaginal bleeding   Malnutrition of moderate degree  SECONDARY DIAGNOSIS:   Past Medical History:  Diagnosis Date  . Blood dyscrasia   . Breast cancer ([place])    right, lumpectomy, radiation, chemo  . Breast cancer ([place])    Right, 2007  . Breast cancer ([place]) 06/20/2016   INVASIVE DUCTAL CARCINOMA.  . Collapsed lung 02/14/2017   RIGHT  . Complication of anesthesia    PT STATED AFTER BIL HIP SURGERIES SHE STOPPED BREATHING THE NIGHT AFTER SURGERY  . COPD (chronic obstructive pulmonary disease) ([place])   . GERD (gastroesophageal reflux disease)   . Headache   . History of chemotherapy   . History of methicillin resistant staphylococcus aureus (MRSA) 2011  . History of radiation therapy   . Hyperlipidemia   . Hypertension   . Liver cancer ([place]) 05/2016  . Lung cancer ([place]) 05/2016  . Osteoarthritis    right hip  . Osteoporosis   . Squamous cell carcinoma    leg, Followed by Dr. Alvie Heidelberg COURSE:  72 year old female patient with history of metastatic breast cancer, possible MDS, recurrent anemia, thrombocytopenia gets frequent blood transfusion, platelet transfusion comes in with vaginal bleeding, patient noted to have bleeding and she is not sure she has bleeding from vagina or the bladder. ER physician found that she has old blood in the vaginal vault.  1. Vaginal bleed: likely secondary to atrophic vaginitis and low platelets; Onco added Lysteda 650 TID x3 days.   2. Severe thrombocytopenia, platelets are 5, s/p platelet transfusion while  in the Hospital. She may receive more as an outpt at cancer center as need, received Granix here as well  3. Severe hypokalemia, likely secondary to poor p.o. Intake: repleted  4. Essential hypertension, controlled  5Severe pancytopenia-underlying possible MDS/side effect of Dacogen per Onco. Work up in progress as an outpt  6. Metastatic breast cancer currently stable continue-Femara per Onco  Overall prognosis is poor - hopefully they would consider DNR. Ongoing d/w Onco for goals of care DISCHARGE CONDITIONS:  stable CONSULTS OBTAINED:  Treatment Team:  Lequita Asal, MD DRUG ALLERGIES:   Allergies  Allergen Reactions  . Codeine Anaphylaxis  . Fish-Derived Products Anaphylaxis  . Morphine Sulfate Anaphylaxis    REACTION: anaphylaxis  . Adhesive [Tape]     PAPER TAPE OK TO USE  . Oxycodone     Nausea and vomiting   DISCHARGE MEDICATIONS:   Allergies as of 08/07/2017      Reactions   Codeine Anaphylaxis   Fish-derived Products Anaphylaxis   Morphine Sulfate Anaphylaxis   REACTION: anaphylaxis   Adhesive [tape]    PAPER TAPE OK TO USE   Oxycodone    Nausea and vomiting      Medication List    TAKE these medications   acyclovir 400 MG tablet Commonly known as:  ZOVIRAX Take 1 tablet (400 mg total) by mouth 2 (two) times daily.   ALPRAZolam 0.5 MG tablet Commonly known as:  XANAX Take 1 tablet (0.5 mg total) by mouth every 8 (eight) hours as  needed for anxiety.   amoxicillin-clavulanate 875-125 MG tablet Commonly known as:  AUGMENTIN Take 1 tablet by mouth 2 (two) times daily.   COSAMIN DS PO Take 1 tablet by mouth daily. In the morning.   cyanocobalamin 1000 MCG/ML injection Commonly known as:  (VITAMIN B-12) Inject 1,000 mcg into the muscle every 30 (thirty) days.   dronabinol 2.5 MG capsule Commonly known as:  MARINOL Take 1 capsule (2.5 mg total) by mouth 2 (two) times daily before a meal.   ergocalciferol 50000 units  capsule Commonly known as:  VITAMIN D2 Take 1 capsule (50,000 Units total) by mouth every Saturday. In the morning.   fexofenadine 180 MG tablet Commonly known as:  ALLEGRA Take 180 mg by mouth every morning.   fluconazole 200 MG tablet Commonly known as:  DIFLUCAN Take 1 tablet (200 mg total) by mouth daily.   letrozole 2.5 MG tablet Commonly known as:  FEMARA Take 1 tablet (2.5 mg total) by mouth daily. Once a day.   levofloxacin 500 MG tablet Commonly known as:  LEVAQUIN Take 500 mg by mouth daily.   metoprolol succinate 25 MG 24 hr tablet Commonly known as:  TOPROL-XL Take 1 tablet (25 mg total) by mouth daily.   pravastatin 40 MG tablet Commonly known as:  PRAVACHOL Take 1 tablet (40 mg total) by mouth daily.   PROBIOTIC & ACIDOPHILUS EX ST PO Take 1 tablet by mouth daily.   prochlorperazine 10 MG tablet Commonly known as:  COMPAZINE Take 1 tablet (10 mg total) by mouth every 6 (six) hours as needed for nausea or vomiting.   ranitidine 75 MG tablet Commonly known as:  ZANTAC Take 75 mg by mouth daily as needed for heartburn.   tranexamic acid 650 MG Tabs tablet Commonly known as:  LYSTEDA Take 2 tablets (1,300 mg total) by mouth 3 (three) times daily.      DISCHARGE INSTRUCTIONS:   DIET:  Regular diet DISCHARGE CONDITION:  Stable ACTIVITY:  Activity as tolerated OXYGEN:  Home Oxygen: No.  Oxygen Delivery: room air DISCHARGE LOCATION:  home   If you experience worsening of your admission symptoms, develop shortness of breath, life threatening emergency, suicidal or homicidal thoughts you must seek medical attention immediately by calling 911 or calling your MD immediately  if symptoms less severe.  You Must read complete instructions/literature along with all the possible adverse reactions/side effects for all the Medicines you take and that have been prescribed to you. Take any new Medicines after you have completely understood and accpet all the  possible adverse reactions/side effects.   Please note  You were cared for by a hospitalist during your hospital stay. If you have any questions about your discharge medications or the care you received while you were in the hospital after you are discharged, you can call the unit and asked to speak with the hospitalist on call if the hospitalist that took care of you is not available. Once you are discharged, your primary care physician will handle any further medical issues. Please note that NO REFILLS for any discharge medications will be authorized once you are discharged, as it is imperative that you return to your primary care physician (or establish a relationship with a primary care physician if you do not have one) for your aftercare needs so that they can reassess your need for medications and monitor your lab values.    On the day of Discharge:  VITAL SIGNS:  Blood pressure (!) 147/73, pulse 92,  temperature 97.6 F (36.4 C), temperature source Oral, resp. rate 16, height 5\' 1"  (1.549 m), weight 57.7 kg (127 lb 4.8 oz), SpO2 99 %. PHYSICAL EXAMINATION:  GENERAL:  72 y.o.-year-old patient lying in the bed with no acute distress.  EYES: Pupils equal, round, reactive to light and accommodation. No scleral icterus. Extraocular muscles intact.  HEENT: Head atraumatic, normocephalic. Oropharynx and nasopharynx clear.  NECK:  Supple, no jugular venous distention. No thyroid enlargement, no tenderness.  LUNGS: Normal breath sounds bilaterally, no wheezing, rales,rhonchi or crepitation. No use of accessory muscles of respiration.  CARDIOVASCULAR: S1, S2 normal. No murmurs, rubs, or gallops.  ABDOMEN: Soft, non-tender, non-distended. Bowel sounds present. No organomegaly or mass.  EXTREMITIES: No pedal edema, cyanosis, or clubbing.  NEUROLOGIC: Cranial nerves II through XII are intact. Muscle strength 5/5 in all extremities. Sensation intact. Gait not checked.  PSYCHIATRIC: The patient is alert  and oriented x 3.  SKIN: No obvious rash, lesion, or ulcer.  DATA REVIEW:   CBC Recent Labs  Lab 08/08/17 0957  WBC 1.3*  HGB 9.8*  HCT 28.0*  PLT 13*    Chemistries  Recent Labs  Lab 08/05/17 1052  08/07/17 0327  NA 136  --  134*  K 2.8*   < > 3.8  CL 99*  --  99*  CO2 28  --  25  GLUCOSE 143*  --  203*  BUN 15  --  16  CREATININE 0.96  --  0.69  CALCIUM 7.8*  --  8.0*  MG  --   --  1.4*  AST 21  --   --   ALT 24  --   --   ALKPHOS 51  --   --   BILITOT 0.6  --   --    < > = values in this interval not displayed.     Follow-up Information    [name], MD. Go on 08/15/2017.   Specialty:  Family Medicine Why:  @11 :30 AM Contact information: [place] [address] [phone]        [name], MD. Go on 08/12/2017.   Specialties:  Internal Medicine, Oncology Why:  @10 :30 AM Contact information: [place] [address] [phone]           Management plans discussed with the patient, family and they are in agreement.  CODE STATUS: Prior   TOTAL TIME TAKING CARE OF THIS PATIENT: 45 minutes.    Max Sane M.D on 08/09/2017 at 6:13 PM  Between 7am to 6pm - Pager - [phone]  After 6pm go to [email or web address] - [employer] [facility]  Office  [phone]  CC: Primary care physician; [name], MD   Note: This dictation was prepared with Dragon dictation along with smaller phrase technology. Any transcriptional errors that result from this process are unintentional.

## 2017-08-10 ENCOUNTER — Other Ambulatory Visit: Payer: Self-pay | Admitting: Internal Medicine

## 2017-08-10 DIAGNOSIS — Z170 Estrogen receptor positive status [ER+]: Principal | ICD-10-CM

## 2017-08-10 DIAGNOSIS — C50811 Malignant neoplasm of overlapping sites of right female breast: Secondary | ICD-10-CM

## 2017-08-10 LAB — CULTURE, BLOOD (ROUTINE X 2)
Culture: NO GROWTH
Culture: NO GROWTH
Culture: NO GROWTH
SPECIAL REQUESTS: ADEQUATE
Special Requests: ADEQUATE
Special Requests: ADEQUATE

## 2017-08-10 LAB — PREPARE PLATELET PHERESIS: UNIT DIVISION: 0

## 2017-08-10 LAB — BPAM PLATELET PHERESIS
Blood Product Expiration Date: 201905311430
ISSUE DATE / TIME: 201905311354
Unit Type and Rh: 5100

## 2017-08-12 ENCOUNTER — Encounter: Payer: Self-pay | Admitting: Internal Medicine

## 2017-08-12 ENCOUNTER — Inpatient Hospital Stay (HOSPITAL_BASED_OUTPATIENT_CLINIC_OR_DEPARTMENT_OTHER): Payer: Medicare Other | Admitting: Internal Medicine

## 2017-08-12 ENCOUNTER — Inpatient Hospital Stay: Payer: Medicare Other

## 2017-08-12 ENCOUNTER — Other Ambulatory Visit: Payer: Self-pay

## 2017-08-12 VITALS — BP 111/74 | HR 120 | Temp 98.0°F | Resp 12 | Ht 61.0 in | Wt 128.6 lb

## 2017-08-12 DIAGNOSIS — Z170 Estrogen receptor positive status [ER+]: Secondary | ICD-10-CM

## 2017-08-12 DIAGNOSIS — Z803 Family history of malignant neoplasm of breast: Secondary | ICD-10-CM

## 2017-08-12 DIAGNOSIS — R609 Edema, unspecified: Secondary | ICD-10-CM | POA: Insufficient documentation

## 2017-08-12 DIAGNOSIS — D709 Neutropenia, unspecified: Secondary | ICD-10-CM

## 2017-08-12 DIAGNOSIS — R630 Anorexia: Secondary | ICD-10-CM | POA: Insufficient documentation

## 2017-08-12 DIAGNOSIS — C50811 Malignant neoplasm of overlapping sites of right female breast: Secondary | ICD-10-CM

## 2017-08-12 DIAGNOSIS — Z7689 Persons encountering health services in other specified circumstances: Secondary | ICD-10-CM

## 2017-08-12 DIAGNOSIS — Z79899 Other long term (current) drug therapy: Secondary | ICD-10-CM | POA: Insufficient documentation

## 2017-08-12 DIAGNOSIS — R040 Epistaxis: Secondary | ICD-10-CM | POA: Diagnosis not present

## 2017-08-12 DIAGNOSIS — Z79811 Long term (current) use of aromatase inhibitors: Secondary | ICD-10-CM

## 2017-08-12 DIAGNOSIS — I447 Left bundle-branch block, unspecified: Secondary | ICD-10-CM | POA: Diagnosis not present

## 2017-08-12 DIAGNOSIS — D693 Immune thrombocytopenic purpura: Secondary | ICD-10-CM

## 2017-08-12 DIAGNOSIS — D469 Myelodysplastic syndrome, unspecified: Secondary | ICD-10-CM | POA: Insufficient documentation

## 2017-08-12 DIAGNOSIS — N898 Other specified noninflammatory disorders of vagina: Secondary | ICD-10-CM

## 2017-08-12 LAB — CBC WITH DIFFERENTIAL/PLATELET
BASOS ABS: 0 10*3/uL (ref 0–0.1)
Basophils Relative: 1 %
EOS PCT: 0 %
Eosinophils Absolute: 0 10*3/uL (ref 0–0.7)
HEMATOCRIT: 28.2 % — AB (ref 35.0–47.0)
Hemoglobin: 9.9 g/dL — ABNORMAL LOW (ref 12.0–16.0)
Lymphocytes Relative: 72 %
Lymphs Abs: 1.3 10*3/uL (ref 1.0–3.6)
MCH: 31.3 pg (ref 26.0–34.0)
MCHC: 35.3 g/dL (ref 32.0–36.0)
MCV: 88.8 fL (ref 80.0–100.0)
MONO ABS: 0.1 10*3/uL — AB (ref 0.2–0.9)
Monocytes Relative: 4 %
NEUTROS ABS: 0.4 10*3/uL — AB (ref 1.4–6.5)
Neutrophils Relative %: 23 %
PLATELETS: 16 10*3/uL — AB (ref 150–440)
RBC: 3.17 MIL/uL — ABNORMAL LOW (ref 3.80–5.20)
RDW: 13.3 % (ref 11.5–14.5)
WBC: 1.8 10*3/uL — AB (ref 3.6–11.0)

## 2017-08-12 LAB — SAMPLE TO BLOOD BANK

## 2017-08-12 MED ORDER — TBO-FILGRASTIM 480 MCG/0.8ML ~~LOC~~ SOSY: 480.0000 ug | PREFILLED_SYRINGE | Freq: Once | SUBCUTANEOUS | Status: DC

## 2017-08-12 MED ORDER — TBO-FILGRASTIM 480 MCG/0.8ML ~~LOC~~ SOSY
480.0000 ug | PREFILLED_SYRINGE | Freq: Once | SUBCUTANEOUS | Status: AC
  Administered 2017-08-12: 480 ug via SUBCUTANEOUS

## 2017-08-12 NOTE — Assessment & Plan Note (Addendum)
#  Highly suspicious for MDS-high-grade; currently status post Dacogen [April 1st 2019].  Currently on further Dacogen hold because of worsening anemia thrombocytopenia neutropenia.  #Severe anemia/severe thrombocytopenia/severe neutropenia-continue supportive therapy with PRBC transfusion approximately once a week; platelet transfusions [crossmatched/washed/Solu-Medrol '20mg'$  premedication]; and also continue Granix Monday through Friday.   # Discussed regarding repeating a bone marrow biopsy to further evaluate status of bone marrow-which might help [place] make decisions whether to treat with further Dacogen versus continued surveillance.  Patient reluctant; and at the same time I would recommend at least recovery of [place] [atleast 1-1.5; off granix] prior to subjecting the patient to afford the bone marrow biopsy.  # Anti-biotic prophylaxis-given severe neutropenia- continue levofloxacin 500 milligrams a day/ acyclovir/dilfucan.  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 On Femara.  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  Patient not too keen on further work-up; I discussed with Dr. Caryl Bis; agrees with holding cardiology work-up at this time  # weight loss-recommend increasing protein intake; and also take the Marinol pills as recommended.  # vaginal bleeding-likely secondary to vaginal atrophy/low platelets.  Recommend continued lysteda.   # continue granix; labs-Monday/ thrusday; follow up with Dr.Corcoran in 2 weeks; labs/hold tube/possible transfusion.

## 2017-08-12 NOTE — Progress Notes (Signed)
[facility] OFFICE PROGRESS NOTE  Patient Care Team: [name], MD as PCP - General (Family Medicine) [name], DPM as Consulting Physician (Podiatry) [name], MD as Consulting Physician (Internal Medicine) Bary Castilla Forest Gleason, MD (General Surgery)  Cancer Staging No matching staging information was found for the patient.   Oncology History   # April 2018- Left chest wall Bx- I[facility]; ER-PR-POS; her 2 Neu NEG; PET- mild hilar/distal adenopathy; ~1 cm right lung nodule/ several sub-centimeter.   # May 2018- cont [facility]; Abemacliclib [on HOLD sec to cytopenia]; MARCH 2019- PET near CR from breast cancer  # April 2017-isolated Thrombocytopenia- platelets- 113; worsening PANCYTOPENIA- April 2018- BMBx- no evidence of malignancy; MDS/Acute leuk; Karyotype/MDS- FISH panel-NEG [d/w Dr.Smir];   # June 2018- REPEAT BMBx/[facility] [June 2018-Dr.Foster; Aug 2018- Dr.Powell at [facility]]; No Diagnosis.   # April 11, 2017 [third bone marrow]-is still inconclusive; no obvious evidence of acute leukemia or obvious MDS except for mild dysplastic changes; and significantly reduced megakaryocytes. 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 [reviwed at [facility]; [facility]-NEG]  # April 1st 2019- Dacogen   # May 29th 2018- N-plate- suboptimal improvement; NOV 1st week start promacta 50 mg/day; suboptimal response; DEC 1st- start promacta 75 mg/day  # DEC 18th-INCREASE PROMACTA to 169m/day  # 2006- RIGHT BREAST CA [pT1cN0M0; STAGE I; ER/PRPos; Her 2 Neu-NEG] s/p FEC x 6 [NSABP B-36]; Femara [Dec 2007-2012]  # Osteoporosis s/p Reclast; BMD- 2015-wnl- ca+vit D   Dr.Stewart [derm ? Squamous cell s/p freeze-oct 2018] --------------------------------------------------------------  DIAGNOSIS: [[name] ] # ? 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  Patient has been recently discharged from the hospital after she was admitted because of ongoing vaginal bleeding.  Patient was evaluated by gynecology/pelvic ultrasound showed old clotted blood.  Her platelets were around 5000; for which she got platelet transfusion.  She also received PRBC transfusion.  Given the ongoing vaginal bleeding patient also was started on lysteda.  Patient continues to have intermittent spotting.  Otherwise denies any fevers.  Denies any chills.  Feels weak.  Poor appetite.  No nausea vomiting.  Review of Systems  Constitutional: Positive for malaise/fatigue and weight loss. Negative for chills, diaphoresis and fever.  HENT: Negative for nosebleeds and sore throat.   Eyes: Negative for double vision.  Respiratory: Positive for shortness of breath. Negative for cough, hemoptysis, sputum production and wheezing.   Cardiovascular: Negative for chest pain, palpitations, orthopnea and leg swelling.  Gastrointestinal: Negative for abdominal pain, blood in stool, constipation, diarrhea, heartburn, melena, nausea and vomiting.  Genitourinary: Negative for dysuria, frequency and urgency.  Musculoskeletal: Negative for back pain and joint pain.  Skin: Negative.  Negative for itching and rash.  Neurological: Negative for dizziness, tingling, focal weakness, weakness and headaches.  Endo/Heme/Allergies: Does not bruise/bleed easily.  Psychiatric/Behavioral: Negative for depression. The  patient is not nervous/anxious and does not have insomnia.  PAST MEDICAL HISTORY :  Past Medical History:  Diagnosis Date  . Blood dyscrasia   . Breast cancer ([place])    right, lumpectomy, radiation, chemo  . Breast cancer ([place])    Right, 2007  . Breast cancer ([place]) 06/20/2016   INVASIVE DUCTAL CARCINOMA.  . Collapsed lung 02/14/2017   RIGHT  . Complication of anesthesia    PT STATED AFTER BIL HIP SURGERIES SHE STOPPED BREATHING THE NIGHT AFTER SURGERY  . COPD (chronic obstructive pulmonary disease) ([place])   . GERD (gastroesophageal reflux disease)   . Headache   . History of chemotherapy   . History of methicillin resistant staphylococcus aureus (MRSA) 2011  . History of radiation therapy   . Hyperlipidemia   . Hypertension   . Liver cancer ([place]) 05/2016  . Lung cancer ([place]) 05/2016  . Osteoarthritis    right hip  . Osteoporosis   . Squamous cell carcinoma    leg, Followed by Dr. Nicole Kindred    PAST SURGICAL HISTORY :   Past Surgical History:  Procedure Laterality Date  . ABDOMINAL HYSTERECTOMY    . BREAST BIOPSY Left 2002   left breast, calcifications  . BREAST BIOPSY Left 06/20/2016   INVASIVE DUCTAL CARCINOMA.  Marland Kitchen BREAST EXCISIONAL BIOPSY Right 2007   positive  . bunion repair    . ESOPHAGOGASTRODUODENOSCOPY (EGD) WITH PROPOFOL N/A 08/05/2016   Procedure: ESOPHAGOGASTRODUODENOSCOPY (EGD) WITH PROPOFOL;  Surgeon: San Jetty, MD;  Location: [facility] ENDOSCOPY;  Service: General;  Laterality: N/A;  . IR FLUORO GUIDE CV LINE RIGHT  07/25/2017  . JOINT REPLACEMENT    . left breast biopsy    . NASAL SINUS SURGERY    . PORTACATH PLACEMENT Right 04/13/2017   Procedure: INSERTION PORT-A-CATH- RIGHT INTERNAL JUGULAR;  Surgeon: Robert Bellow, MD;  Location: [facility] ORS;  Service: General;  Laterality: Right;  . right hip replacement    . SHOULDER SURGERY    . SQUAMOUS CELL CARCINOMA EXCISION     right leg, Dr. Nicole Kindred  . TOTAL HIP ARTHROPLASTY     right    FAMILY  HISTORY :   Family History  Problem Relation Age of Onset  . Heart disease Father 22  . Heart disease Mother   . Hyperlipidemia Mother   . Heart disease Brother   . Diabetes Brother   . Diabetes Maternal Grandmother   . Breast cancer Cousin   . Kidney disease Brother     SOCIAL HISTORY:   Social History   Tobacco Use  . Smoking status: Current Some Day Smoker    Packs/day: 0.10    Years: 30.00    Pack years: 3.00    Types: Cigarettes  . Smokeless tobacco: Never Used  Substance Use Topics  . Alcohol use: Yes    Alcohol/week: 0.0 oz    Comment: socially  . Drug use: No    ALLERGIES:  is allergic to codeine; fish-derived products; morphine sulfate; adhesive [tape]; and oxycodone.  MEDICATIONS:  Current Outpatient Medications  Medication Sig Dispense Refill  . acyclovir (ZOVIRAX) 400 MG tablet Take 1 tablet (400 mg total) by mouth 2 (two) times daily. 60 tablet 3  . ALPRAZolam (XANAX) 0.5 MG tablet Take 1 tablet (0.5 mg total) by mouth every 8 (eight) hours as needed for anxiety. 30 tablet 2  . cyanocobalamin (,VITAMIN B-12,) 1000 MCG/ML injection Inject 1,000 mcg into the muscle every 30 (thirty) days.    Marland Kitchen dronabinol (MARINOL) 2.5 MG capsule Take 1 capsule (2.5 mg total) by mouth  2 (two) times daily before a meal. 30 capsule 0  . fexofenadine (ALLEGRA) 180 MG tablet Take 180 mg by mouth every morning.     . fluconazole (DIFLUCAN) 200 MG tablet Take 1 tablet (200 mg total) by mouth daily. 30 tablet 3  . Glucosamine-Chondroitin (COSAMIN DS PO) Take 1 tablet by mouth daily. In the morning.    Marland Kitchen letrozole (FEMARA) 2.5 MG tablet Take 1 tablet (2.5 mg total) by mouth daily. Once a day. 90 tablet 0  . levofloxacin (LEVAQUIN) 500 MG tablet Take 500 mg by mouth daily.    . metoprolol succinate (TOPROL-XL) 25 MG 24 hr tablet Take 1 tablet (25 mg total) by mouth daily. 90 tablet 3  . pravastatin (PRAVACHOL) 40 MG tablet Take 1 tablet (40 mg total) by mouth daily. 90 tablet 3  .  Probiotic Product (PROBIOTIC & ACIDOPHILUS EX ST PO) Take 1 tablet by mouth daily.     . prochlorperazine (COMPAZINE) 10 MG tablet Take 1 tablet (10 mg total) by mouth every 6 (six) hours as needed for nausea or vomiting. 30 tablet 2  . ranitidine (ZANTAC) 75 MG tablet Take 75 mg by mouth daily as needed for heartburn.     . tranexamic acid (LYSTEDA) 650 MG TABS tablet Take 2 tablets (1,300 mg total) by mouth 3 (three) times daily. 45 tablet 2  . Vitamin D, Ergocalciferol, (DRISDOL) 50000 units CAPS capsule TAKE 1 CAPSULE (50,000 UNITS TOTAL) BY MOUTH EVERY SATURDAY. IN THE MORNING. 12 capsule 0   No current facility-administered medications for this visit.    Facility-Administered Medications Ordered in Other Visits  Medication Dose Route Frequency Provider Last Rate Last Dose  . 0.9 %  sodium chloride infusion   Intravenous Continuous [name], MD   Stopped at 06/28/17 1450  . 0.9 %  sodium chloride infusion   Intravenous Continuous [name], MD   Stopped at 07/29/17 1515  . heparin lock flush 100 unit/mL  500 Units Intravenous Once Charlaine Dalton R, MD      . sodium chloride flush (NS) 0.9 % injection 10 mL  10 mL Intravenous PRN Charlaine Dalton R, MD      . sodium chloride flush (NS) 0.9 % injection 10 mL  10 mL Intravenous PRN Verlon Au, NP      . sodium chloride flush (NS) 0.9 % injection 10 mL  10 mL Intravenous PRN [name], MD   10 mL at 07/29/17 1405    PHYSICAL EXAMINATION: ECOG PERFORMANCE STATUS: 1 - Symptomatic but completely ambulatory  BP 111/74 (BP Location: Left Arm, Patient Position: Sitting)   Pulse (!) 120   Temp 98 F (36.7 C) (Tympanic)   Resp 12   Ht 5' 1"  (1.549 m)   Wt 128 lb 9.6 oz (58.3 kg)   BMI 24.30 kg/m   Filed Weights   08/12/17 1057  Weight: 128 lb 9.6 oz (58.3 kg)    GENERAL: Well-nourished well-developed; Alert, no distress and comfortable.  Accompanied by husband. EYES: Positive for pallor.    OROPHARYNX: no thrush or ulceration; NECK: supple; no lymph nodes felt. LYMPH:  no palpable lymphadenopathy in the axillary or inguinal regions LUNGS: Decreased breath sounds auscultation bilaterally. No wheeze or crackles HEART/CVS: regular rate & rhythm and no murmurs; No lower extremity edema ABDOMEN:abdomen soft, non-tender and normal bowel sounds. No hepatomegaly or splenomegaly.  Musculoskeletal:no cyanosis of digits and no clubbing  PSYCH: alert & oriented x 3 with fluent speech NEURO:  no focal motor/sensory deficits SKIN:  no rashes or significant lesions    LABORATORY DATA:  I have reviewed the data as listed    Component Value Date/Time   NA 134 (L) 08/07/2017 0327   K 3.8 08/07/2017 0327   CL 99 (L) 08/07/2017 0327   CO2 25 08/07/2017 0327   GLUCOSE 203 (H) 08/07/2017 0327   BUN 16 08/07/2017 0327   CREATININE 0.69 08/07/2017 0327   CREATININE 0.67 07/09/2014 1321   CREATININE 0.70 04/30/2014 1455   CALCIUM 8.0 (L) 08/07/2017 0327   PROT 5.8 (L) 08/05/2017 1052   PROT 7.3 07/09/2014 1321   ALBUMIN 2.4 (L) 08/05/2017 1052   ALBUMIN 4.3 07/09/2014 1321   AST 21 08/05/2017 1052   AST 19 07/09/2014 1321   ALT 24 08/05/2017 1052   ALT 18 07/09/2014 1321   ALKPHOS 51 08/05/2017 1052   ALKPHOS 66 07/09/2014 1321   BILITOT 0.6 08/05/2017 1052   BILITOT 0.6 07/09/2014 1321   GFRNONAA >60 08/07/2017 0327   GFRNONAA >60 07/09/2014 1321   GFRAA >60 08/07/2017 0327   GFRAA >60 07/09/2014 1321    No results found for: SPEP, UPEP  Lab Results  Component Value Date   WBC 1.8 (L) 08/12/2017   NEUTROABS 0.4 (L) 08/12/2017   HGB 9.9 (L) 08/12/2017   HCT 28.2 (L) 08/12/2017   MCV 88.8 08/12/2017   PLT 16 (LL) 08/12/2017      Chemistry      Component Value Date/Time   NA 134 (L) 08/07/2017 0327   K 3.8 08/07/2017 0327   CL 99 (L) 08/07/2017 0327   CO2 25 08/07/2017 0327   BUN 16 08/07/2017 0327   CREATININE 0.69 08/07/2017 0327   CREATININE 0.67 07/09/2014  1321   CREATININE 0.70 04/30/2014 1455      Component Value Date/Time   CALCIUM 8.0 (L) 08/07/2017 0327   ALKPHOS 51 08/05/2017 1052   ALKPHOS 66 07/09/2014 1321   AST 21 08/05/2017 1052   AST 19 07/09/2014 1321   ALT 24 08/05/2017 1052   ALT 18 07/09/2014 1321   BILITOT 0.6 08/05/2017 1052   BILITOT 0.6 07/09/2014 1321       RADIOGRAPHIC STUDIES: I have personally reviewed the radiological images as listed and agreed with the findings in the report. No results found.   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  Currently on further Dacogen hold because of worsening anemia thrombocytopenia neutropenia.  #Severe anemia/severe thrombocytopenia/severe neutropenia-continue supportive therapy with PRBC transfusion approximately once a week; platelet transfusions [crossmatched/washed/Solu-Medrol 59m premedication]; and also continue Granix Monday through Friday.   # Discussed regarding repeating a bone marrow biopsy to further evaluate status of bone marrow-which might help u[place]oreamake decisions whether to treat with further Dacogen versus continued surveillance.  Patient reluctant; and at the same time I would recommend at least recovery of ADelta[atleast 1-1.5; off granix] prior to subjecting the patient to afford the bone marrow biopsy.  # Anti-biotic prophylaxis-given severe neutropenia- continue levofloxacin 500 milligrams a day/ acyclovir/dilfucan.  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 On Femara.  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  Patient not too keen on further work-up; I discussed with Dr. SCaryl Bis agrees with holding cardiology work-up at this time  # weight loss-recommend increasing protein intake; and also take the Marinol pills as recommended.  # vaginal  bleeding-likely secondary to vaginal atrophy/low  platelets.  Recommend continued lysteda.   # continue granix; labs-Monday/ thrusday; follow up with Dr.Corcoran in 2 weeks; labs/hold tube/possible transfusion.    No orders of the defined types were placed in this encounter.  All questions were answered. The patient knows to call the clinic with any problems, questions or concerns.      [name], MD 08/15/2017 12:18 AM

## 2017-08-12 NOTE — Progress Notes (Signed)
Appetite somewhat improved, She has been feeling more tired this week.

## 2017-08-13 ENCOUNTER — Inpatient Hospital Stay: Payer: Medicare Other

## 2017-08-13 DIAGNOSIS — Z7689 Persons encountering health services in other specified circumstances: Secondary | ICD-10-CM | POA: Diagnosis not present

## 2017-08-13 DIAGNOSIS — C50811 Malignant neoplasm of overlapping sites of right female breast: Secondary | ICD-10-CM

## 2017-08-13 DIAGNOSIS — D709 Neutropenia, unspecified: Secondary | ICD-10-CM | POA: Diagnosis not present

## 2017-08-13 DIAGNOSIS — R040 Epistaxis: Secondary | ICD-10-CM | POA: Diagnosis not present

## 2017-08-13 DIAGNOSIS — Z170 Estrogen receptor positive status [ER+]: Secondary | ICD-10-CM

## 2017-08-13 DIAGNOSIS — D693 Immune thrombocytopenic purpura: Secondary | ICD-10-CM

## 2017-08-13 DIAGNOSIS — D469 Myelodysplastic syndrome, unspecified: Secondary | ICD-10-CM | POA: Diagnosis not present

## 2017-08-13 DIAGNOSIS — R609 Edema, unspecified: Secondary | ICD-10-CM | POA: Diagnosis not present

## 2017-08-13 MED ORDER — TBO-FILGRASTIM 480 MCG/0.8ML ~~LOC~~ SOSY
480.0000 ug | PREFILLED_SYRINGE | Freq: Once | SUBCUTANEOUS | Status: AC
  Administered 2017-08-13: 480 ug via SUBCUTANEOUS

## 2017-08-14 ENCOUNTER — Telehealth: Payer: Self-pay | Admitting: *Deleted

## 2017-08-14 ENCOUNTER — Inpatient Hospital Stay: Payer: Medicare Other

## 2017-08-14 DIAGNOSIS — R040 Epistaxis: Secondary | ICD-10-CM | POA: Diagnosis not present

## 2017-08-14 DIAGNOSIS — D469 Myelodysplastic syndrome, unspecified: Secondary | ICD-10-CM | POA: Diagnosis not present

## 2017-08-14 DIAGNOSIS — D709 Neutropenia, unspecified: Secondary | ICD-10-CM | POA: Diagnosis not present

## 2017-08-14 DIAGNOSIS — C50811 Malignant neoplasm of overlapping sites of right female breast: Secondary | ICD-10-CM

## 2017-08-14 DIAGNOSIS — D693 Immune thrombocytopenic purpura: Secondary | ICD-10-CM

## 2017-08-14 DIAGNOSIS — Z7689 Persons encountering health services in other specified circumstances: Secondary | ICD-10-CM | POA: Diagnosis not present

## 2017-08-14 DIAGNOSIS — R609 Edema, unspecified: Secondary | ICD-10-CM | POA: Diagnosis not present

## 2017-08-14 DIAGNOSIS — Z170 Estrogen receptor positive status [ER+]: Secondary | ICD-10-CM

## 2017-08-14 MED ORDER — TBO-FILGRASTIM 480 MCG/0.8ML ~~LOC~~ SOSY
480.0000 ug | PREFILLED_SYRINGE | Freq: Once | SUBCUTANEOUS | Status: AC
  Administered 2017-08-14: 480 ug via SUBCUTANEOUS

## 2017-08-14 NOTE — Telephone Encounter (Addendum)
Patient came to clinic today. Recently dx with atrophic vaginitis. Patient reports foul smelling vaginal discharge. "smells like feces at time, but I would think my ultrasound would further demonstrate that I didn't have a vaginal fistula. But it did not show this on the imaging last week." pt requesting a pelvic exam tommorrow By Ander Purpura, NP. She is currently using vaseline at the vaginal apex. She states that this area is "raw and tender."

## 2017-08-15 ENCOUNTER — Encounter: Payer: Self-pay | Admitting: Nurse Practitioner

## 2017-08-15 ENCOUNTER — Inpatient Hospital Stay: Payer: Medicare Other | Admitting: Family Medicine

## 2017-08-15 ENCOUNTER — Other Ambulatory Visit: Payer: Self-pay | Admitting: *Deleted

## 2017-08-15 ENCOUNTER — Inpatient Hospital Stay (HOSPITAL_BASED_OUTPATIENT_CLINIC_OR_DEPARTMENT_OTHER): Payer: Medicare Other | Admitting: Nurse Practitioner

## 2017-08-15 ENCOUNTER — Inpatient Hospital Stay: Payer: Medicare Other

## 2017-08-15 ENCOUNTER — Inpatient Hospital Stay: Payer: Medicare Other | Attending: Hematology and Oncology

## 2017-08-15 VITALS — BP 144/80 | HR 120 | Temp 97.4°F | Resp 18

## 2017-08-15 DIAGNOSIS — E785 Hyperlipidemia, unspecified: Secondary | ICD-10-CM

## 2017-08-15 DIAGNOSIS — Z9221 Personal history of antineoplastic chemotherapy: Secondary | ICD-10-CM

## 2017-08-15 DIAGNOSIS — M7989 Other specified soft tissue disorders: Secondary | ICD-10-CM

## 2017-08-15 DIAGNOSIS — K219 Gastro-esophageal reflux disease without esophagitis: Secondary | ICD-10-CM

## 2017-08-15 DIAGNOSIS — N898 Other specified noninflammatory disorders of vagina: Secondary | ICD-10-CM

## 2017-08-15 DIAGNOSIS — D709 Neutropenia, unspecified: Secondary | ICD-10-CM | POA: Diagnosis not present

## 2017-08-15 DIAGNOSIS — Z8614 Personal history of Methicillin resistant Staphylococcus aureus infection: Secondary | ICD-10-CM | POA: Insufficient documentation

## 2017-08-15 DIAGNOSIS — R040 Epistaxis: Secondary | ICD-10-CM | POA: Insufficient documentation

## 2017-08-15 DIAGNOSIS — R630 Anorexia: Secondary | ICD-10-CM

## 2017-08-15 DIAGNOSIS — Z170 Estrogen receptor positive status [ER+]: Secondary | ICD-10-CM

## 2017-08-15 DIAGNOSIS — I447 Left bundle-branch block, unspecified: Secondary | ICD-10-CM | POA: Diagnosis not present

## 2017-08-15 DIAGNOSIS — R233 Spontaneous ecchymoses: Secondary | ICD-10-CM | POA: Insufficient documentation

## 2017-08-15 DIAGNOSIS — Z79811 Long term (current) use of aromatase inhibitors: Secondary | ICD-10-CM | POA: Diagnosis not present

## 2017-08-15 DIAGNOSIS — D696 Thrombocytopenia, unspecified: Secondary | ICD-10-CM

## 2017-08-15 DIAGNOSIS — J449 Chronic obstructive pulmonary disease, unspecified: Secondary | ICD-10-CM

## 2017-08-15 DIAGNOSIS — M810 Age-related osteoporosis without current pathological fracture: Secondary | ICD-10-CM

## 2017-08-15 DIAGNOSIS — D469 Myelodysplastic syndrome, unspecified: Secondary | ICD-10-CM

## 2017-08-15 DIAGNOSIS — Z7689 Persons encountering health services in other specified circumstances: Secondary | ICD-10-CM | POA: Diagnosis not present

## 2017-08-15 DIAGNOSIS — R634 Abnormal weight loss: Secondary | ICD-10-CM

## 2017-08-15 DIAGNOSIS — R300 Dysuria: Secondary | ICD-10-CM

## 2017-08-15 DIAGNOSIS — M1990 Unspecified osteoarthritis, unspecified site: Secondary | ICD-10-CM | POA: Insufficient documentation

## 2017-08-15 DIAGNOSIS — C50811 Malignant neoplasm of overlapping sites of right female breast: Secondary | ICD-10-CM

## 2017-08-15 DIAGNOSIS — Z803 Family history of malignant neoplasm of breast: Secondary | ICD-10-CM | POA: Diagnosis not present

## 2017-08-15 DIAGNOSIS — R609 Edema, unspecified: Secondary | ICD-10-CM | POA: Diagnosis not present

## 2017-08-15 DIAGNOSIS — I10 Essential (primary) hypertension: Secondary | ICD-10-CM

## 2017-08-15 DIAGNOSIS — Z923 Personal history of irradiation: Secondary | ICD-10-CM | POA: Insufficient documentation

## 2017-08-15 DIAGNOSIS — D693 Immune thrombocytopenic purpura: Secondary | ICD-10-CM

## 2017-08-15 LAB — WET PREP, GENITAL
CLUE CELLS WET PREP: NONE SEEN
Sperm: NONE SEEN
TRICH WET PREP: NONE SEEN
Yeast Wet Prep HPF POC: NONE SEEN

## 2017-08-15 LAB — CBC WITH DIFFERENTIAL/PLATELET
BASOS ABS: 0 10*3/uL (ref 0–0.1)
Basophils Relative: 0 %
Eosinophils Absolute: 0 10*3/uL (ref 0–0.7)
Eosinophils Relative: 1 %
HEMATOCRIT: 25.7 % — AB (ref 35.0–47.0)
HEMOGLOBIN: 9.2 g/dL — AB (ref 12.0–16.0)
LYMPHS PCT: 68 %
Lymphs Abs: 1.5 10*3/uL (ref 1.0–3.6)
MCH: 31.4 pg (ref 26.0–34.0)
MCHC: 35.6 g/dL (ref 32.0–36.0)
MCV: 88.1 fL (ref 80.0–100.0)
MONO ABS: 0.1 10*3/uL — AB (ref 0.2–0.9)
Monocytes Relative: 5 %
NEUTROS ABS: 0.6 10*3/uL — AB (ref 1.4–6.5)
NEUTROS PCT: 26 %
Platelets: 5 10*3/uL — CL (ref 150–400)
RBC: 2.92 MIL/uL — AB (ref 3.80–5.20)
RDW: 13.2 % (ref 11.5–14.5)
WBC: 2.2 10*3/uL — ABNORMAL LOW (ref 3.6–11.0)

## 2017-08-15 LAB — URINALYSIS, COMPLETE (UACMP) WITH MICROSCOPIC
BILIRUBIN URINE: NEGATIVE
Bacteria, UA: NONE SEEN
Glucose, UA: NEGATIVE mg/dL
Hgb urine dipstick: NEGATIVE
Ketones, ur: NEGATIVE mg/dL
LEUKOCYTES UA: NEGATIVE
Nitrite: NEGATIVE
PH: 5 (ref 5.0–8.0)
Protein, ur: NEGATIVE mg/dL
SPECIFIC GRAVITY, URINE: 1.016 (ref 1.005–1.030)

## 2017-08-15 LAB — SAMPLE TO BLOOD BANK

## 2017-08-15 MED ORDER — TBO-FILGRASTIM 480 MCG/0.8ML ~~LOC~~ SOSY: 480.0000 ug | PREFILLED_SYRINGE | Freq: Once | SUBCUTANEOUS | Status: DC

## 2017-08-15 MED ORDER — METRONIDAZOLE 500 MG PO TABS: 500.0000 mg | ORAL_TABLET | Freq: Two times a day (BID) | ORAL | 0 refills | Status: AC

## 2017-08-15 MED ORDER — TBO-FILGRASTIM 480 MCG/0.8ML ~~LOC~~ SOSY
480.0000 ug | PREFILLED_SYRINGE | Freq: Once | SUBCUTANEOUS | Status: AC
  Administered 2017-08-15: 480 ug via SUBCUTANEOUS
  Filled 2017-08-15: qty 0.8

## 2017-08-15 NOTE — Progress Notes (Signed)
V/o Dr. Rogue Bussing to schedule pt for 1 unit of plts.  plt count of 5. No blood transfusion needed as hgb is 9.2. Pt made aware to come at 1:30pm.

## 2017-08-15 NOTE — Progress Notes (Signed)
Patient reports continued vaginal discharge, here for pelvic exam.

## 2017-08-15 NOTE — Progress Notes (Signed)
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   # May 2018- cont [facility]; Abemacliclib [on HOLD sec to cytopenia]; MARCH 2019- PET near CR from breast cancer  # April 2017-isolated Thrombocytopenia- platelets- 113; worsening PANCYTOPENIA- April 2018- BMBx- no evidence of malignancy; MDS/Acute leuk; Karyotype/MDS- FISH panel-NEG [d/w Shelly Arias];   # June 2018- REPEAT BMBx/[facility] [June 2018-Shelly Arias; Aug 2018- Shelly Arias at [facility]]; No Diagnosis.   # April 11, 2017 [third bone marrow]-is still inconclusive; no obvious evidence of acute leukemia or obvious MDS except for mild dysplastic changes; and significantly reduced megakaryocytes. 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 [reviwed at [facility]; [facility]-NEG]  # April 1st 2019- Dacogen   # May 29th 2018- N-plate- suboptimal improvement; NOV 1st week start promacta 50 mg/day; suboptimal response; DEC 1st- start promacta 75 mg/day  # DEC 18th-INCREASE PROMACTA to 170m/day  # 2006- RIGHT BREAST CA [pT1cN0M0; STAGE I; ER/PRPos; Her 2 Neu-NEG] s/p FEC x 6 [NSABP B-36]; Femara [Dec 2007-2012]  # Osteoporosis s/p Reclast; BMD- 2015-wnl- ca+vit D    Shelly Arias [derm ? Squamous cell s/p freeze-oct 2018] --------------------------------------------------------------  DIAGNOSIS: [Shelly Kluver2017 ] # ? MDS /Meta.Breast cancer- ER/PR pos; her-NEG  STAGE:  IV  ;GOALS: palliative  CURRENT/MOST RECENT THERAPY: dacogen [April 2019]; Letrozole [May 2017];   # Pt needs PLATELETS- WASHED/ CROSS-MATCHED PLATELETS        Carcinoma of overlapping sites of right breast in female, estrogen receptor positive (Shelly Arias    MDS (myelodysplastic syndrome) (Shelly Arias   Patient last evaluated by primary oncologist, Dr. BRogue Arias on 08/12/2017.   Interval history- Patient complains of an abnormal vaginal discharge for 10 days.  She describes vaginal symptoms malodorous, brown discharge but appears like feces.  She says flow is light but constant.  She has been wearing mini pads to absorb flow.  She also complains of vulvar erythema and pain/irritation.  She has been trying to perform good hygiene at home and hoping symptoms would resolve spontaneously without improvement.  She denies any urinary symptoms.  She denies any hematochezia or melena.  She denies fever.  She states she is concerned that she has developed a fistula and that the discharge is feces.  She states that the symptoms are significantly distressing to her and interfere with her quality of life. She has had a prior hysterectomy and has 1 retained ovary.  Patient, she had history of cervical cancer (?)  She was seen in the emergency room on 08/05/2017 for same.  She states that at that time blood was bright red and has changed to brownish over time she states odor has gotten worse.  Due to oncology/hematology history, she was admitted to hospital.  Per ER physician's note, pelvic exam revealed blood in vaginal vault.  Per patient, she was diagnosed with atrophic  vaginitis and due to her severe thrombocytopenia she was high risk for active vaginal bleed.  Pelvic ultrasound was performed and did not reveal  cause of bleeding.  She continues to require platelet transfusions regularly and has had previous episodes of nosebleeds.  She states she is due for colonoscopy this year.  Her appetite continues to be reduced. She had been having low-grade intermittent fevers over the past several weeks which have resolved.   ECOG FS:1 - Symptomatic but completely ambulatory  Review of systems- Review of Systems  Constitutional: Positive for weight loss. Negative for chills, fever and malaise/fatigue.  HENT: Negative.  Negative for congestion, ear discharge, ear pain, sinus pain, sore throat and tinnitus.   Eyes: Negative.   Respiratory: Negative.  Negative for cough, sputum production and shortness of breath.   Cardiovascular: Negative.  Negative for chest pain, palpitations, orthopnea, claudication and leg swelling.  Gastrointestinal: Negative.  Negative for abdominal pain, blood in stool, constipation, diarrhea, heartburn, nausea and vomiting.  Genitourinary: Negative.   Musculoskeletal: Negative.   Skin: Negative.   Neurological: Negative.  Negative for dizziness, tingling, weakness and headaches.  Endo/Heme/Allergies: Negative.   Psychiatric/Behavioral: Negative.      Allergies  Allergen Reactions  . Codeine Anaphylaxis  . Fish-Derived Products Anaphylaxis  . Morphine Sulfate Anaphylaxis    REACTION: anaphylaxis  . Adhesive [Tape]     PAPER TAPE OK TO USE  . Oxycodone     Nausea and vomiting     Past Medical History:  Diagnosis Date  . Blood dyscrasia   . Breast cancer ([place])    right, lumpectomy, radiation, chemo  . Breast cancer ([place])    Right, 2007  . Breast cancer ([place]) 06/20/2016   INVASIVE DUCTAL CARCINOMA.  . Collapsed lung 02/14/2017   RIGHT  . Complication of anesthesia    PT STATED AFTER BIL HIP SURGERIES SHE STOPPED BREATHING THE NIGHT AFTER SURGERY  . COPD (chronic obstructive pulmonary disease) ([place])   . GERD (gastroesophageal reflux disease)   . Headache   .  History of chemotherapy   . History of methicillin resistant staphylococcus aureus (MRSA) 2011  . History of radiation therapy   . Hyperlipidemia   . Hypertension   . Liver cancer ([place]) 05/2016  . Lung cancer ([place]) 05/2016  . Osteoarthritis    right hip  . Osteoporosis   . Squamous cell carcinoma    leg, Followed by Dr. Nicole Kindred     Past Surgical History:  Procedure Laterality Date  . ABDOMINAL HYSTERECTOMY    . BREAST BIOPSY Left 2002   left breast, calcifications  . BREAST BIOPSY Left 06/20/2016   INVASIVE DUCTAL CARCINOMA.  Marland Kitchen BREAST EXCISIONAL BIOPSY Right 2007   positive  . bunion repair    . ESOPHAGOGASTRODUODENOSCOPY (EGD) WITH PROPOFOL N/A 08/05/2016   Procedure: ESOPHAGOGASTRODUODENOSCOPY (EGD) WITH PROPOFOL;  Surgeon: San Jetty, MD;  Location: [facility] ENDOSCOPY;  Service: General;  Laterality: N/A;  . IR FLUORO GUIDE CV LINE RIGHT  07/25/2017  . JOINT REPLACEMENT    . left breast biopsy    . NASAL SINUS SURGERY    . PORTACATH PLACEMENT Right 04/13/2017   Procedure: INSERTION PORT-A-CATH- RIGHT INTERNAL JUGULAR;  Surgeon: Robert Bellow, MD;  Location: [facility] ORS;  Service: General;  Laterality: Right;  . right hip replacement    . SHOULDER SURGERY    . SQUAMOUS CELL CARCINOMA EXCISION     right leg, Dr. Nicole Kindred  . TOTAL HIP ARTHROPLASTY     right  Social History   Socioeconomic History  . Marital status: Married    Spouse name: Not on file  . Number of children: Not on file  . Years of education: Not on file  . Highest education level: Not on file  Occupational History  . Not on file  Social Needs  . Financial resource strain: Not hard at all  . Food insecurity:    Worry: Never true    Inability: Never true  . Transportation needs:    Medical: No    Non-medical: No  Tobacco Use  . Smoking status: Current Some Day Smoker    Packs/day: 0.10    Years: 30.00    Pack years: 3.00    Types: Cigarettes  . Smokeless tobacco: Never Used  Substance and  Sexual Activity  . Alcohol use: Yes    Alcohol/week: 0.0 oz    Comment: socially  . Drug use: No  . Sexual activity: Never  Lifestyle  . Physical activity:    Days per week: 4 days    Minutes per session: 30 min  . Stress: Not at all  Relationships  . Social connections:    Talks on phone: Three times a week    Gets together: Twice a week    Attends religious service: More than 4 times per year    Active member of club or organization: No    Attends meetings of clubs or organizations: Never    Relationship status: Married  . Intimate partner violence:    Fear of current or ex partner: No    Emotionally abused: No    Physically abused: No    Forced sexual activity: No  Other Topics Concern  . Not on file  Social History Narrative  . Not on file    Family History  Problem Relation Age of Onset  . Heart disease Father 13  . Heart disease Mother   . Hyperlipidemia Mother   . Heart disease Brother   . Diabetes Brother   . Diabetes Maternal Grandmother   . Breast cancer Cousin   . Kidney disease Brother      Current Outpatient Medications:  .  acyclovir (ZOVIRAX) 400 MG tablet, Take 1 tablet (400 mg total) by mouth 2 (two) times daily., Disp: 60 tablet, Rfl: 3 .  ALPRAZolam (XANAX) 0.5 MG tablet, Take 1 tablet (0.5 mg total) by mouth every 8 (eight) hours as needed for anxiety., Disp: 30 tablet, Rfl: 2 .  cyanocobalamin (,VITAMIN B-12,) 1000 MCG/ML injection, Inject 1,000 mcg into the muscle every 30 (thirty) days., Disp: , Rfl:  .  dronabinol (MARINOL) 2.5 MG capsule, Take 1 capsule (2.5 mg total) by mouth 2 (two) times daily before a meal., Disp: 30 capsule, Rfl: 0 .  fexofenadine (ALLEGRA) 180 MG tablet, Take 180 mg by mouth every morning. , Disp: , Rfl:  .  fluconazole (DIFLUCAN) 200 MG tablet, Take 1 tablet (200 mg total) by mouth daily., Disp: 30 tablet, Rfl: 3 .  Glucosamine-Chondroitin (COSAMIN DS PO), Take 1 tablet by mouth daily. In the morning., Disp: , Rfl:  .   letrozole (FEMARA) 2.5 MG tablet, Take 1 tablet (2.5 mg total) by mouth daily. Once a day., Disp: 90 tablet, Rfl: 0 .  levofloxacin (LEVAQUIN) 500 MG tablet, Take 500 mg by mouth daily., Disp: , Rfl:  .  metoprolol succinate (TOPROL-XL) 25 MG 24 hr tablet, Take 1 tablet (25 mg total) by mouth daily., Disp: 90 tablet, Rfl: 3 .  pravastatin (PRAVACHOL)  40 MG tablet, Take 1 tablet (40 mg total) by mouth daily., Disp: 90 tablet, Rfl: 3 .  Probiotic Product (PROBIOTIC & ACIDOPHILUS EX ST PO), Take 1 tablet by mouth daily. , Disp: , Rfl:  .  prochlorperazine (COMPAZINE) 10 MG tablet, Take 1 tablet (10 mg total) by mouth every 6 (six) hours as needed for nausea or vomiting., Disp: 30 tablet, Rfl: 2 .  ranitidine (ZANTAC) 75 MG tablet, Take 75 mg by mouth daily as needed for heartburn. , Disp: , Rfl:  .  tranexamic acid (LYSTEDA) 650 MG TABS tablet, Take 2 tablets (1,300 mg total) by mouth 3 (three) times daily., Disp: 45 tablet, Rfl: 2 .  Vitamin D, Ergocalciferol, (DRISDOL) 50000 units CAPS capsule, TAKE 1 CAPSULE (50,000 UNITS TOTAL) BY MOUTH EVERY SATURDAY. IN THE MORNING., Disp: 12 capsule, Rfl: 0 No current facility-administered medications for this visit.   Facility-Administered Medications Ordered in Other Visits:  .  0.9 %  sodium chloride infusion, , Intravenous, Continuous, Shelly Sickle, MD, Stopped at 06/28/17 1450 .  0.9 %  sodium chloride infusion, , Intravenous, Continuous, Shelly Sickle, MD, Stopped at 07/29/17 1515 .  heparin lock flush 100 unit/mL, 500 Units, Intravenous, Once, Brahmanday, Lenetta Quaker R, MD .  sodium chloride flush (NS) 0.9 % injection 10 mL, 10 mL, Intravenous, PRN, Charlaine Dalton R, MD .  sodium chloride flush (NS) 0.9 % injection 10 mL, 10 mL, Intravenous, PRN, Verlon Au, NP .  sodium chloride flush (NS) 0.9 % injection 10 mL, 10 mL, Intravenous, PRN, Shelly Sickle, MD, 10 mL at 07/29/17 1405  Physical exam:  Vitals:   08/15/17 1140   BP: (!) 144/80  Pulse: (!) 120  Resp: 18  Temp: (!) 97.4 F (36.3 C)  TempSrc: Tympanic   GENERAL: Alert, oriented.  Unaccompanied. HEENT:  Sclerae anicteric.  Oropharynx clear and moist. No ulcerations or evidence of oropharyngeal candidiasis. Neck is supple.  NODES:  No cervical, supraclavicular, or axillary lymphadenopathy palpated.  LUNGS:  Clear to auscultation bilaterally.  No wheezes or rhonchi. HEART:  Regular rate and rhythm. No murmur appreciated. ABDOMEN:  Soft, nontender.  Positive, normoactive bowel sounds. No organomegaly palpated. MSK:  No focal spinal tenderness to palpation. Full range of motion bilaterally in the upper extremities. EXTREMITIES:  No peripheral edema.   SKIN:  Clear with no obvious rashes or skin changes. No nail dyscrasia. NEURO:  Nonfocal. Well oriented.  Appropriate affect.  Pelvic: Exam Chaperoned by RN EGBUS: Labia erythematous.  No lesions. Cervix: Surgically absent, Uterus: Surgically absent, Adnexa: Nonpalpable Vagina: Brown/yellow discharge of moderate amount present in vaginal vault.  No obvious lesions or erythema.  No obvious bleeding. Rectovaginal: Deferred due to [place] < 1   CMP Latest Ref Rng & Units 08/07/2017  Glucose 65 - 99 mg/dL 203(H)  BUN 6 - 20 mg/dL 16  Creatinine 0.44 - 1.00 mg/dL 0.69  Sodium 135 - 145 mmol/L 134(L)  Potassium 3.5 - 5.1 mmol/L 3.8  Chloride 101 - 111 mmol/L 99(L)  CO2 22 - 32 mmol/L 25  Calcium 8.9 - 10.3 mg/dL 8.0(L)  Total Protein 6.5 - 8.1 g/dL -  Total Bilirubin 0.3 - 1.2 mg/dL -  Alkaline Phos 38 - 126 U/L -  AST 15 - 41 U/L -  ALT 14 - 54 U/L -   CBC Latest Ref Rng & Units 08/12/2017  WBC 3.6 - 11.0 K/uL 1.8(L)  Hemoglobin 12.0 - 16.0 g/dL 9.9(L)  Hematocrit 35.0 - 47.0 % 28.2(L)  Platelets 150 - 440 K/uL 16(LL)    No images are attached to the encounter.  Dg Chest 2 View  Result Date: 07/21/2017 CLINICAL DATA:  Fever today. EXAM: CHEST - 2 VIEW COMPARISON:  PET CT scan 06/03/2017. PA and  lateral chest 03/15/2017. Single-view of the chest 04/13/2017. FINDINGS: Right Port-A-Cath is unchanged since the most recent exam. The patient is status post right axillary dissection. Lungs are clear. Heart size is normal. No pneumothorax or pleural fluid. Aortic atherosclerosis is identified. Convex left lumbar scoliosis and multilevel thoracic and lumbar degenerative disease are noted. IMPRESSION: No acute disease. Electronically Signed   By: Inge Rise M.D.   On: 07/21/2017 15:49   Ir Fluoro Guide Cv Line Right  Result Date: 07/25/2017 INDICATION: 72 year old with history of breast cancer and 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 Patient was admitted for neutropenic fever. Patient has a right jugular Port-A-Cath which is not aspirating. Patient presents for a port check. EXAM: PORT-A-CATH INJECTION UNDER FLUOROSCOPY MEDICATIONS: None ANESTHESIA/SEDATION: None FLUOROSCOPY TIME:  Fluoroscopy Time: 6 seconds, 45 mGy CONTRAST:  10 mL [id number] COMPLICATIONS: None immediate. PROCEDURE: The procedure was explained to the patient. The risks and benefits of the procedure were discussed and the patient's questions were addressed. Informed consent was obtained from the patient. The right chest port was already accessed. The port flushed easily but would not aspirate. The port was flushed with contrast under fluoroscopic evaluation. Despite normal appearance of the port under fluoroscopy, the port would not aspirate. Therefore, the existing needle was completely removed. A new access needle was placed with sterile technique. The port was able to aspirate and flush with placement of the new port needle. Fluoroscopic images were taken and saved for this procedure. FINDINGS: Right jugular Port-A-Cath. Catheter tip at the superior cavoatrial junction. No evidence for port discontinuity or contrast extravasation. Contrast empties into the right atrium without evidence of a fibrin sheath. IMPRESSION: Normal appearance of  the right jugular Port-A-Cath. Port-A-Cath tip is well positioned and no evidence for a fibrin sheath. The Port-A-Cath was aspirating normally after a new access needle was placed. Electronically Signed   By: Markus Daft M.D.   On: 07/25/2017 16:48   US Pelvic Complete With Transvaginal  Result Date: 08/05/2017 CLINICAL DATA:  Vaginal bleeding for 1 day. Status post hysterectomy. History of right breast cancer. EXAM: TRANSABDOMINAL AND TRANSVAGINAL ULTRASOUND OF PELVIS TECHNIQUE: Both transabdominal and transvaginal ultrasound examinations of the pelvis were performed. Transabdominal technique was performed for global imaging of the pelvis including uterus, ovaries, adnexal regions, and pelvic cul-de-sac. It was necessary to proceed with endovaginal exam following the transabdominal exam to visualize the ovary/adnexa. COMPARISON:  PET 06/03/2017 FINDINGS: Uterus Surgically absent Right ovary Surgically absent Left ovary Not visualized Other findings No abnormal free fluid. IMPRESSION: Hysterectomy and right oophorectomy. No explanation for vaginal bleeding. Left ovary not visualized. Electronically Signed   By: Abigail Miyamoto M.D.   On: 08/05/2017 15:47     Assessment and plan- Patient is a 72 y.o. female with history of MDS and breast cancer who presents to symptom management clinic for vaginal discharge.  1. Highly suspicious for MDS- high grade; currently s/p Dacogen (06/10/17)- held due to worsening anemia, thrombocytopenia, and neutropenia. Supportive therapy with pRBC transfusions and platelet transfusions (crossmatched/washed/solumedrol39m premed). Currently on Granix for neutropenia.    2.  Vaginal discharge- Wet prep and culture of discharge performed. Findings consistent with BV. UA negative. Unable to perform rectal exam d/t neutropenia so cannot rule out fistula. Discussed  with Dr. Fransisca Connors (gyn-onc) and Dr. Bary Castilla (general surgery) who recommended adding Flagyl 524m TID x 7 days. Given anc,  they would be reluctant to proceed with further evaluation of fistula at this time but could consider if neutropenia improves. Currently on antibiotic/fungal/viral prophylaxis for severe neutropenia but adding flagyl for anaerobes.  Follow up as scheduled with Dr. BRogue Arias   Visit Diagnosis 1. MDS (myelodysplastic syndrome) ([place]   2. Vaginal discharge   3. Dysuria    Patient expressed understanding and was in agreement with this plan. She also understands that She can call clinic at any time with any questions, concerns, or complaints.   A total of (40) minutes of face-to-face time was spent with this patient with greater than 50% of that time in counseling and care-coordination.   LBeckey Rutter DNP, AGNP-C CLittle Americaat [phone](work cell) [phone](office)

## 2017-08-16 ENCOUNTER — Inpatient Hospital Stay: Payer: Medicare Other

## 2017-08-16 VITALS — BP 132/72 | HR 102 | Temp 97.1°F | Resp 20

## 2017-08-16 DIAGNOSIS — C50811 Malignant neoplasm of overlapping sites of right female breast: Secondary | ICD-10-CM

## 2017-08-16 DIAGNOSIS — Z170 Estrogen receptor positive status [ER+]: Secondary | ICD-10-CM

## 2017-08-16 DIAGNOSIS — D693 Immune thrombocytopenic purpura: Secondary | ICD-10-CM

## 2017-08-16 DIAGNOSIS — D709 Neutropenia, unspecified: Secondary | ICD-10-CM | POA: Diagnosis not present

## 2017-08-16 DIAGNOSIS — R609 Edema, unspecified: Secondary | ICD-10-CM | POA: Diagnosis not present

## 2017-08-16 DIAGNOSIS — D696 Thrombocytopenia, unspecified: Secondary | ICD-10-CM

## 2017-08-16 DIAGNOSIS — R040 Epistaxis: Secondary | ICD-10-CM | POA: Diagnosis not present

## 2017-08-16 DIAGNOSIS — D469 Myelodysplastic syndrome, unspecified: Secondary | ICD-10-CM | POA: Diagnosis not present

## 2017-08-16 DIAGNOSIS — Z7689 Persons encountering health services in other specified circumstances: Secondary | ICD-10-CM | POA: Diagnosis not present

## 2017-08-16 MED ORDER — METHYLPREDNISOLONE SODIUM SUCC 125 MG IJ SOLR
40.0000 mg | Freq: Once | INTRAMUSCULAR | Status: AC
  Administered 2017-08-16: 40 mg via INTRAVENOUS
  Filled 2017-08-16: qty 2

## 2017-08-16 MED ORDER — TBO-FILGRASTIM 480 MCG/0.8ML ~~LOC~~ SOSY
480.0000 ug | PREFILLED_SYRINGE | Freq: Once | SUBCUTANEOUS | Status: AC
  Administered 2017-08-16: 480 ug via SUBCUTANEOUS
  Filled 2017-08-16: qty 0.8

## 2017-08-16 MED ORDER — ACETAMINOPHEN 325 MG PO TABS
650.0000 mg | ORAL_TABLET | Freq: Once | ORAL | Status: AC
  Administered 2017-08-16: 650 mg via ORAL
  Filled 2017-08-16: qty 2

## 2017-08-16 MED ORDER — SODIUM CHLORIDE 0.9 % IV SOLN
250.0000 mL | Freq: Once | INTRAVENOUS | Status: AC
  Administered 2017-08-16: 250 mL via INTRAVENOUS
  Filled 2017-08-16: qty 250

## 2017-08-16 MED ORDER — TBO-FILGRASTIM 480 MCG/0.8ML ~~LOC~~ SOSY: 480.0000 ug | PREFILLED_SYRINGE | Freq: Once | SUBCUTANEOUS | Status: DC

## 2017-08-16 MED ORDER — SODIUM CHLORIDE 0.9% FLUSH
10.0000 mL | INTRAVENOUS | Status: AC | PRN
  Administered 2017-08-16: 10 mL
  Filled 2017-08-16: qty 10

## 2017-08-16 MED ORDER — HEPARIN SOD (PORK) LOCK FLUSH 100 UNIT/ML IV SOLN
500.0000 [IU] | Freq: Every day | INTRAVENOUS | Status: AC | PRN
  Administered 2017-08-16: 500 [IU]
  Filled 2017-08-16: qty 5

## 2017-08-16 MED ORDER — DIPHENHYDRAMINE HCL 25 MG PO CAPS
25.0000 mg | ORAL_CAPSULE | Freq: Once | ORAL | Status: AC
  Administered 2017-08-16: 25 mg via ORAL
  Filled 2017-08-16: qty 1

## 2017-08-17 LAB — BODY FLUID CULTURE: CULTURE: NORMAL

## 2017-08-17 LAB — PREPARE PLATELET PHERESIS: Unit division: 0

## 2017-08-17 LAB — BPAM PLATELET PHERESIS
Blood Product Expiration Date: 201906071440
ISSUE DATE / TIME: 201906071426
Unit Type and Rh: 5100

## 2017-08-19 ENCOUNTER — Telehealth: Payer: Self-pay | Admitting: *Deleted

## 2017-08-19 ENCOUNTER — Inpatient Hospital Stay: Payer: Medicare Other

## 2017-08-19 ENCOUNTER — Telehealth: Payer: Self-pay

## 2017-08-19 DIAGNOSIS — Z170 Estrogen receptor positive status [ER+]: Secondary | ICD-10-CM

## 2017-08-19 DIAGNOSIS — R609 Edema, unspecified: Secondary | ICD-10-CM | POA: Diagnosis not present

## 2017-08-19 DIAGNOSIS — D693 Immune thrombocytopenic purpura: Secondary | ICD-10-CM

## 2017-08-19 DIAGNOSIS — Z7689 Persons encountering health services in other specified circumstances: Secondary | ICD-10-CM | POA: Diagnosis not present

## 2017-08-19 DIAGNOSIS — D709 Neutropenia, unspecified: Secondary | ICD-10-CM | POA: Diagnosis not present

## 2017-08-19 DIAGNOSIS — D469 Myelodysplastic syndrome, unspecified: Secondary | ICD-10-CM | POA: Diagnosis not present

## 2017-08-19 DIAGNOSIS — R040 Epistaxis: Secondary | ICD-10-CM | POA: Diagnosis not present

## 2017-08-19 DIAGNOSIS — C50811 Malignant neoplasm of overlapping sites of right female breast: Secondary | ICD-10-CM

## 2017-08-19 LAB — SAMPLE TO BLOOD BANK

## 2017-08-19 LAB — CBC WITH DIFFERENTIAL/PLATELET
BASOS ABS: 0 10*3/uL (ref 0–0.1)
BASOS PCT: 0 %
EOS ABS: 0 10*3/uL (ref 0–0.7)
Eosinophils Relative: 0 %
HEMATOCRIT: 22.9 % — AB (ref 35.0–47.0)
Hemoglobin: 8.2 g/dL — ABNORMAL LOW (ref 12.0–16.0)
Lymphocytes Relative: 59 %
Lymphs Abs: 1.4 10*3/uL (ref 1.0–3.6)
MCH: 31.6 pg (ref 26.0–34.0)
MCHC: 35.7 g/dL (ref 32.0–36.0)
MCV: 88.5 fL (ref 80.0–100.0)
Monocytes Absolute: 0.1 10*3/uL — ABNORMAL LOW (ref 0.2–0.9)
Monocytes Relative: 6 %
NEUTROS ABS: 0.8 10*3/uL — AB (ref 1.4–6.5)
NEUTROS PCT: 35 %
PLATELETS: 19 10*3/uL — AB (ref 150–400)
RBC: 2.58 MIL/uL — ABNORMAL LOW (ref 3.80–5.20)
RDW: 13.1 % (ref 11.5–14.5)
WBC: 2.4 10*3/uL — ABNORMAL LOW (ref 3.6–11.0)

## 2017-08-19 MED ORDER — TBO-FILGRASTIM 480 MCG/0.8ML ~~LOC~~ SOSY
480.0000 ug | PREFILLED_SYRINGE | Freq: Once | SUBCUTANEOUS | Status: AC
  Administered 2017-08-19: 480 ug via SUBCUTANEOUS

## 2017-08-19 NOTE — Telephone Encounter (Signed)
Received phone call from [facility] in cancer center lab  -  Critical plt count: PLT=19; HGB=8.2 at 1033 am.  contacted patient with the plan of care. Spoke with Ander Purpura, NP holding off on plt infusion and blood transfusion. Patient instructed to keep schedule apts. She is asymptomatic today. No shortness of breath. She does reports some minor petechia on arms/legs.  She reports improvement in vaginal discharge, but discharge is still present.

## 2017-08-19 NOTE — Telephone Encounter (Signed)
Nutrition Assessment   Reason for Assessment: Patient identified on Malnutrition Screening report for weight loss and poor appetite.   ASSESSMENT:  72 year old female with metastatic breast cancer and MDS. Past medical history of COPD, GERD, HTN, HLD.  Patient with severe anemia, thrombocytopenia.  Patient with recent vaginal bleeding.    Called patient via phone to introduce self and service.  Patient reports has tried marinol and makes her "feel funny" and makes mouth taste funny.  Reports that she is not currently take marinol.  Reports that appetite is some better.  Reports today ate 1/2 hot dog at lunch which usually skips lunch.  Reports had good dinner last night of chicken pie, mashed potatoes and pinto beans.  Drinks boost original. Does not really like ensure products.  Also drinks smart water and uses mio drops in water.  Tries to flavor foods using cinnamon, pickles etc so she can taste things better.    Nutrition Focused Physical Exam: deferred  Medications: reviewed  Labs: reviewed  Anthropometrics:   Height: 61 inches Weight: 128 lb 9.6 oz UBW: Noted 140 in Jan 2019 BMI: 24  9% weight loss in the last 5 months   Estimated Energy Needs  Kcals: 0158-6825 calories/d Protein: 87-101 g/d Fluid: 2 L/d  NUTRITION DIAGNOSIS: Inadequate oral intake related to poor appetite as evidenced by 9% weight loss in the last 5 months   MALNUTRITION DIAGNOSIS: continue to monitor   INTERVENTION:   Encouraged high calorie, high protein oral nutrition supplement Will plan to meet with patient on Thursday, June 13 for nutrition visit after injection.  Patient was agreeable    MONITORING, EVALUATION, GOAL: weight trends, intake  NEXT VISIT: June 13 following injection  Graceanna Theissen B. Zenia Resides, [place], [place] Registered Dietitian [phone] (pager)

## 2017-08-20 ENCOUNTER — Inpatient Hospital Stay: Payer: Medicare Other

## 2017-08-20 DIAGNOSIS — Z7689 Persons encountering health services in other specified circumstances: Secondary | ICD-10-CM | POA: Diagnosis not present

## 2017-08-20 DIAGNOSIS — Z170 Estrogen receptor positive status [ER+]: Secondary | ICD-10-CM

## 2017-08-20 DIAGNOSIS — R609 Edema, unspecified: Secondary | ICD-10-CM | POA: Diagnosis not present

## 2017-08-20 DIAGNOSIS — C50811 Malignant neoplasm of overlapping sites of right female breast: Secondary | ICD-10-CM | POA: Diagnosis not present

## 2017-08-20 DIAGNOSIS — D693 Immune thrombocytopenic purpura: Secondary | ICD-10-CM

## 2017-08-20 DIAGNOSIS — D469 Myelodysplastic syndrome, unspecified: Secondary | ICD-10-CM | POA: Diagnosis not present

## 2017-08-20 DIAGNOSIS — R040 Epistaxis: Secondary | ICD-10-CM | POA: Diagnosis not present

## 2017-08-20 DIAGNOSIS — D709 Neutropenia, unspecified: Secondary | ICD-10-CM | POA: Diagnosis not present

## 2017-08-20 MED ORDER — TBO-FILGRASTIM 480 MCG/0.8ML ~~LOC~~ SOSY
480.0000 ug | PREFILLED_SYRINGE | Freq: Once | SUBCUTANEOUS | Status: AC
  Administered 2017-08-20: 480 ug via SUBCUTANEOUS

## 2017-08-21 ENCOUNTER — Encounter: Payer: Self-pay | Admitting: Internal Medicine

## 2017-08-21 ENCOUNTER — Inpatient Hospital Stay: Payer: Medicare Other

## 2017-08-21 DIAGNOSIS — R040 Epistaxis: Secondary | ICD-10-CM | POA: Diagnosis not present

## 2017-08-21 DIAGNOSIS — Z7689 Persons encountering health services in other specified circumstances: Secondary | ICD-10-CM | POA: Diagnosis not present

## 2017-08-21 DIAGNOSIS — D469 Myelodysplastic syndrome, unspecified: Secondary | ICD-10-CM | POA: Diagnosis not present

## 2017-08-21 DIAGNOSIS — R609 Edema, unspecified: Secondary | ICD-10-CM | POA: Diagnosis not present

## 2017-08-21 DIAGNOSIS — D709 Neutropenia, unspecified: Secondary | ICD-10-CM | POA: Diagnosis not present

## 2017-08-21 DIAGNOSIS — C50811 Malignant neoplasm of overlapping sites of right female breast: Secondary | ICD-10-CM | POA: Diagnosis not present

## 2017-08-21 DIAGNOSIS — Z170 Estrogen receptor positive status [ER+]: Secondary | ICD-10-CM

## 2017-08-21 DIAGNOSIS — D693 Immune thrombocytopenic purpura: Secondary | ICD-10-CM

## 2017-08-21 MED ORDER — TBO-FILGRASTIM 480 MCG/0.8ML ~~LOC~~ SOSY
480.0000 ug | PREFILLED_SYRINGE | Freq: Once | SUBCUTANEOUS | Status: AC
  Administered 2017-08-21: 480 ug via SUBCUTANEOUS

## 2017-08-22 ENCOUNTER — Other Ambulatory Visit: Payer: Self-pay

## 2017-08-22 ENCOUNTER — Inpatient Hospital Stay: Payer: Medicare Other

## 2017-08-22 ENCOUNTER — Telehealth: Payer: Self-pay | Admitting: *Deleted

## 2017-08-22 ENCOUNTER — Other Ambulatory Visit: Payer: Self-pay | Admitting: *Deleted

## 2017-08-22 DIAGNOSIS — Z170 Estrogen receptor positive status [ER+]: Secondary | ICD-10-CM

## 2017-08-22 DIAGNOSIS — R040 Epistaxis: Secondary | ICD-10-CM | POA: Diagnosis not present

## 2017-08-22 DIAGNOSIS — D709 Neutropenia, unspecified: Secondary | ICD-10-CM | POA: Diagnosis not present

## 2017-08-22 DIAGNOSIS — D469 Myelodysplastic syndrome, unspecified: Secondary | ICD-10-CM | POA: Diagnosis not present

## 2017-08-22 DIAGNOSIS — C50811 Malignant neoplasm of overlapping sites of right female breast: Secondary | ICD-10-CM | POA: Diagnosis not present

## 2017-08-22 DIAGNOSIS — D696 Thrombocytopenia, unspecified: Secondary | ICD-10-CM

## 2017-08-22 DIAGNOSIS — D693 Immune thrombocytopenic purpura: Secondary | ICD-10-CM

## 2017-08-22 DIAGNOSIS — D649 Anemia, unspecified: Secondary | ICD-10-CM

## 2017-08-22 DIAGNOSIS — R609 Edema, unspecified: Secondary | ICD-10-CM | POA: Diagnosis not present

## 2017-08-22 DIAGNOSIS — Z7689 Persons encountering health services in other specified circumstances: Secondary | ICD-10-CM | POA: Diagnosis not present

## 2017-08-22 LAB — CBC WITH DIFFERENTIAL/PLATELET
Basophils Absolute: 0 10*3/uL (ref 0–0.1)
Basophils Relative: 1 %
Eosinophils Absolute: 0 10*3/uL (ref 0–0.7)
Eosinophils Relative: 0 %
HCT: 20.2 % — ABNORMAL LOW (ref 35.0–47.0)
Hemoglobin: 7.2 g/dL — ABNORMAL LOW (ref 12.0–16.0)
LYMPHS ABS: 1.1 10*3/uL (ref 1.0–3.6)
LYMPHS PCT: 51 %
MCH: 31.6 pg (ref 26.0–34.0)
MCHC: 35.8 g/dL (ref 32.0–36.0)
MCV: 88.1 fL (ref 80.0–100.0)
MONO ABS: 0.2 10*3/uL (ref 0.2–0.9)
MONOS PCT: 8 %
Neutro Abs: 0.8 10*3/uL — ABNORMAL LOW (ref 1.4–6.5)
Neutrophils Relative %: 40 %
Platelets: 7 10*3/uL — CL (ref 150–400)
RBC: 2.29 MIL/uL — AB (ref 3.80–5.20)
RDW: 13 % (ref 11.5–14.5)
WBC: 2.1 10*3/uL — AB (ref 3.6–11.0)

## 2017-08-22 LAB — SAMPLE TO BLOOD BANK

## 2017-08-22 LAB — PREPARE RBC (CROSSMATCH)

## 2017-08-22 MED ORDER — TBO-FILGRASTIM 480 MCG/0.8ML ~~LOC~~ SOSY
480.0000 ug | PREFILLED_SYRINGE | Freq: Once | SUBCUTANEOUS | Status: AC
  Administered 2017-08-22: 480 ug via SUBCUTANEOUS

## 2017-08-22 NOTE — Telephone Encounter (Signed)
Spoke with Ander Purpura, NP. Patient is symptomatic - shortness of breath and weakness. hgb dropped to 7.2; critical plt count at 7. Orders obtained for 1 unit of blood and 1 unit of plts (crossmatched/washed) plt components.  Blood bank notified- will transfuse on 6/14. Pt made aware. Has an apt on 08/23/17 at 10 am. Pt instructed to keep all other apts.

## 2017-08-22 NOTE — Progress Notes (Signed)
Nutrition Follow-up:  Patient with metastatic breast cancer and MDS.  Patient followed by Dr. Jacinto Reap.  Is currently not on any treatment.  Met with patient and husband today following lab draw.  Patient reports that she is nauseated just sitting and talking this am.  Reports that smells (husband cooked eggs and bacon last night) make her sick.  Reports that she is taking compazine 1 time every morning.  Also reports that taste of food is "off".  Reports able to taste mustard and cinnamon and pickles.     Medications: reviewed  Labs: reviewed   NUTRITION DIAGNOSIS: Inadequate oral intake continues   INTERVENTION:   Encouraged high calorie, high protein foods.  Handout given today Samples of high calorie shakes given to patient today. Encouraged patient to touch base with MD if nausea medication is not working.  Reports has tried zofran but makes her constipated.    MONITORING, EVALUATION, GOAL: weight trends, intake   NEXT VISIT: as needed  Shelly Arias B. Zenia Resides, [place], [place] Registered Dietitian [phone] (pager)

## 2017-08-23 ENCOUNTER — Inpatient Hospital Stay: Payer: Medicare Other

## 2017-08-23 VITALS — BP 135/80 | HR 91 | Temp 98.1°F | Resp 20

## 2017-08-23 DIAGNOSIS — D469 Myelodysplastic syndrome, unspecified: Secondary | ICD-10-CM

## 2017-08-23 DIAGNOSIS — Z170 Estrogen receptor positive status [ER+]: Secondary | ICD-10-CM

## 2017-08-23 DIAGNOSIS — C50811 Malignant neoplasm of overlapping sites of right female breast: Secondary | ICD-10-CM

## 2017-08-23 DIAGNOSIS — R609 Edema, unspecified: Secondary | ICD-10-CM | POA: Diagnosis not present

## 2017-08-23 DIAGNOSIS — D696 Thrombocytopenia, unspecified: Secondary | ICD-10-CM

## 2017-08-23 DIAGNOSIS — Z7689 Persons encountering health services in other specified circumstances: Secondary | ICD-10-CM | POA: Diagnosis not present

## 2017-08-23 DIAGNOSIS — D649 Anemia, unspecified: Secondary | ICD-10-CM

## 2017-08-23 DIAGNOSIS — D693 Immune thrombocytopenic purpura: Secondary | ICD-10-CM

## 2017-08-23 DIAGNOSIS — R040 Epistaxis: Secondary | ICD-10-CM | POA: Diagnosis not present

## 2017-08-23 DIAGNOSIS — D709 Neutropenia, unspecified: Secondary | ICD-10-CM | POA: Diagnosis not present

## 2017-08-23 MED ORDER — SODIUM CHLORIDE 0.9 % IV SOLN
250.0000 mL | Freq: Once | INTRAVENOUS | Status: AC
  Administered 2017-08-23: 250 mL via INTRAVENOUS
  Filled 2017-08-23: qty 250

## 2017-08-23 MED ORDER — METHYLPREDNISOLONE SODIUM SUCC 125 MG IJ SOLR
40.0000 mg | Freq: Once | INTRAMUSCULAR | Status: AC
  Administered 2017-08-23: 40 mg via INTRAVENOUS
  Filled 2017-08-23: qty 2

## 2017-08-23 MED ORDER — ACETAMINOPHEN 325 MG PO TABS
650.0000 mg | ORAL_TABLET | Freq: Once | ORAL | Status: AC
  Administered 2017-08-23: 650 mg via ORAL
  Filled 2017-08-23: qty 2

## 2017-08-23 MED ORDER — DIPHENHYDRAMINE HCL 25 MG PO CAPS
25.0000 mg | ORAL_CAPSULE | Freq: Once | ORAL | Status: AC
  Administered 2017-08-23: 25 mg via ORAL
  Filled 2017-08-23: qty 1

## 2017-08-23 MED ORDER — SODIUM CHLORIDE 0.9% FLUSH
10.0000 mL | INTRAVENOUS | Status: DC | PRN
  Filled 2017-08-23: qty 10

## 2017-08-23 MED ORDER — HEPARIN SOD (PORK) LOCK FLUSH 100 UNIT/ML IV SOLN
500.0000 [IU] | Freq: Every day | INTRAVENOUS | Status: AC | PRN
  Administered 2017-08-23: 500 [IU]
  Filled 2017-08-23: qty 5

## 2017-08-23 MED ORDER — TBO-FILGRASTIM 480 MCG/0.8ML ~~LOC~~ SOSY
480.0000 ug | PREFILLED_SYRINGE | Freq: Once | SUBCUTANEOUS | Status: AC
  Administered 2017-08-23: 480 ug via SUBCUTANEOUS

## 2017-08-24 LAB — TYPE AND SCREEN
ABO/RH(D): O POS
ANTIBODY SCREEN: POSITIVE
Donor AG Type: NEGATIVE
UNIT DIVISION: 0

## 2017-08-24 LAB — BPAM PLATELET PHERESIS
BLOOD PRODUCT EXPIRATION DATE: 201906141430
ISSUE DATE / TIME: 201906141406
Unit Type and Rh: 5100

## 2017-08-24 LAB — BPAM RBC
BLOOD PRODUCT EXPIRATION DATE: 201906302359
ISSUE DATE / TIME: 201906141018
UNIT TYPE AND RH: 5100

## 2017-08-24 LAB — PREPARE PLATELET PHERESIS: Unit division: 0

## 2017-08-26 ENCOUNTER — Inpatient Hospital Stay: Payer: Medicare Other

## 2017-08-26 ENCOUNTER — Telehealth: Payer: Self-pay | Admitting: *Deleted

## 2017-08-26 ENCOUNTER — Inpatient Hospital Stay (HOSPITAL_BASED_OUTPATIENT_CLINIC_OR_DEPARTMENT_OTHER): Payer: Medicare Other | Admitting: Hematology and Oncology

## 2017-08-26 VITALS — BP 121/82 | HR 102 | Temp 95.4°F | Resp 18 | Wt 127.1 lb

## 2017-08-26 DIAGNOSIS — D469 Myelodysplastic syndrome, unspecified: Secondary | ICD-10-CM

## 2017-08-26 DIAGNOSIS — C50811 Malignant neoplasm of overlapping sites of right female breast: Secondary | ICD-10-CM

## 2017-08-26 DIAGNOSIS — Z7689 Persons encountering health services in other specified circumstances: Secondary | ICD-10-CM | POA: Diagnosis not present

## 2017-08-26 DIAGNOSIS — I10 Essential (primary) hypertension: Secondary | ICD-10-CM | POA: Diagnosis not present

## 2017-08-26 DIAGNOSIS — J449 Chronic obstructive pulmonary disease, unspecified: Secondary | ICD-10-CM | POA: Diagnosis not present

## 2017-08-26 DIAGNOSIS — Z923 Personal history of irradiation: Secondary | ICD-10-CM

## 2017-08-26 DIAGNOSIS — R609 Edema, unspecified: Secondary | ICD-10-CM | POA: Diagnosis not present

## 2017-08-26 DIAGNOSIS — D709 Neutropenia, unspecified: Secondary | ICD-10-CM

## 2017-08-26 DIAGNOSIS — M7989 Other specified soft tissue disorders: Secondary | ICD-10-CM

## 2017-08-26 DIAGNOSIS — D649 Anemia, unspecified: Secondary | ICD-10-CM

## 2017-08-26 DIAGNOSIS — R040 Epistaxis: Secondary | ICD-10-CM | POA: Diagnosis not present

## 2017-08-26 DIAGNOSIS — M810 Age-related osteoporosis without current pathological fracture: Secondary | ICD-10-CM

## 2017-08-26 DIAGNOSIS — Z79811 Long term (current) use of aromatase inhibitors: Secondary | ICD-10-CM

## 2017-08-26 DIAGNOSIS — R634 Abnormal weight loss: Secondary | ICD-10-CM

## 2017-08-26 DIAGNOSIS — K219 Gastro-esophageal reflux disease without esophagitis: Secondary | ICD-10-CM | POA: Diagnosis not present

## 2017-08-26 DIAGNOSIS — Z170 Estrogen receptor positive status [ER+]: Secondary | ICD-10-CM | POA: Diagnosis not present

## 2017-08-26 DIAGNOSIS — R233 Spontaneous ecchymoses: Secondary | ICD-10-CM

## 2017-08-26 DIAGNOSIS — D696 Thrombocytopenia, unspecified: Secondary | ICD-10-CM

## 2017-08-26 DIAGNOSIS — D693 Immune thrombocytopenic purpura: Secondary | ICD-10-CM

## 2017-08-26 DIAGNOSIS — E785 Hyperlipidemia, unspecified: Secondary | ICD-10-CM | POA: Diagnosis not present

## 2017-08-26 DIAGNOSIS — Z9221 Personal history of antineoplastic chemotherapy: Secondary | ICD-10-CM

## 2017-08-26 DIAGNOSIS — Z8614 Personal history of Methicillin resistant Staphylococcus aureus infection: Secondary | ICD-10-CM

## 2017-08-26 DIAGNOSIS — M1990 Unspecified osteoarthritis, unspecified site: Secondary | ICD-10-CM

## 2017-08-26 LAB — CBC WITH DIFFERENTIAL/PLATELET
BASOS ABS: 0 10*3/uL (ref 0–0.1)
BASOS PCT: 0 %
EOS ABS: 0 10*3/uL (ref 0–0.7)
Eosinophils Relative: 0 %
HCT: 24.5 % — ABNORMAL LOW (ref 35.0–47.0)
Hemoglobin: 8.7 g/dL — ABNORMAL LOW (ref 12.0–16.0)
Lymphocytes Relative: 57 %
Lymphs Abs: 1.5 10*3/uL (ref 1.0–3.6)
MCH: 31.8 pg (ref 26.0–34.0)
MCHC: 35.6 g/dL (ref 32.0–36.0)
MCV: 89.3 fL (ref 80.0–100.0)
MONO ABS: 0.2 10*3/uL (ref 0.2–0.9)
Monocytes Relative: 7 %
Neutro Abs: 0.9 10*3/uL — ABNORMAL LOW (ref 1.4–6.5)
Neutrophils Relative %: 36 %
Platelets: 13 10*3/uL — CL (ref 150–400)
RBC: 2.75 MIL/uL — ABNORMAL LOW (ref 3.80–5.20)
RDW: 13.3 % (ref 11.5–14.5)
WBC: 2.6 10*3/uL — AB (ref 3.6–11.0)

## 2017-08-26 LAB — SAMPLE TO BLOOD BANK

## 2017-08-26 MED ORDER — TBO-FILGRASTIM 480 MCG/0.8ML ~~LOC~~ SOSY: 480.0000 ug | PREFILLED_SYRINGE | Freq: Once | SUBCUTANEOUS | Status: DC

## 2017-08-26 MED ORDER — TBO-FILGRASTIM 480 MCG/0.8ML ~~LOC~~ SOSY
480.0000 ug | PREFILLED_SYRINGE | Freq: Once | SUBCUTANEOUS | Status: AC
  Administered 2017-08-26: 480 ug via SUBCUTANEOUS
  Filled 2017-08-26: qty 0.8

## 2017-08-26 NOTE — Progress Notes (Signed)
Patient here today regarding follow up for MDS.  Patient of Dr. Rogue Bussing.  Patient is accompanied by her husband today.

## 2017-08-26 NOTE — Telephone Encounter (Signed)
Critical results Platelet 7

## 2017-08-26 NOTE — Progress Notes (Signed)
[place] OFFICE PROGRESS NOTE  Patient Care Team: [name], MD as PCP - General (Family Medicine) [name], DPM as Consulting Physician (Podiatry) [name], MD as Medical Oncologist (Hematology and Oncology) Bary Castilla Forest Gleason, MD (General Surgery)  Cancer Staging No matching staging information was found for the patient.   Oncology History   # April 2018- Left chest wall Bx- [facility]; ER-PR-POS; her 2 Neu NEG; PET- mild hilar/distal adenopathy; ~1 cm right lung nodule/ several sub-centimeter.   # May 2018- cont [facility]; Abemacliclib [on HOLD sec to cytopenia]; MARCH 2019- PET near CR from breast cancer  # April 2017-isolated Thrombocytopenia- platelets- 113; worsening PANCYTOPENIA- April 2018- BMBx- no evidence of malignancy; MDS/Acute leuk; Karyotype/MDS- FISH panel-NEG [d/w Dr.Smir];   # June 2018- REPEAT BMBx/[facility] [June 2018-Dr.Foster; Aug 2018- Dr.Powell at [facility]]; No Diagnosis.   # April 11, 2017 [third bone marrow]-is still inconclusive; no obvious evidence of acute leukemia or obvious MDS except for mild dysplastic changes; and significantly reduced megakaryocytes. 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 [reviwed at [facility]; [facility]-NEG]  # April 1st 2019- Dacogen   # May 29th 2018- N-plate- suboptimal improvement; NOV 1st week start promacta 50 mg/day; suboptimal response; DEC 1st- start promacta 75 mg/day  # DEC 18th-INCREASE PROMACTA to 124m/day  # 2006- RIGHT BREAST CA [pT1cN0M0; STAGE I; ER/PRPos; Her 2 Neu-NEG] s/p FEC x 6 [NSABP B-36]; Femara [Dec 2007-2012]  # Osteoporosis s/p Reclast; BMD- 2015-wnl- ca+vit D   Dr.Stewart [derm ? Squamous cell s/p freeze-oct 2018] --------------------------------------------------------------  DIAGNOSIS: [[name] ] # ? MDS /Meta.Breast cancer- ER/PR pos; her-NEG  STAGE:  IV  ;GOALS:  palliative  CURRENT/MOST RECENT THERAPY: dacogen [April 2019]; Letrozole [May 2017];   # Pt needs PLATELETS- WASHED/ CROSS-MATCHED PLATELETS        Carcinoma of overlapping sites of right breast in female, estrogen receptor positive ([place]    MDS (myelodysplastic syndrome) ([place]      INTERVAL HISTORY:  [name].  female pleasant patient above complicated history of possible MDS transfusion dependent; also metastatic breast cancer on Femara is here for follow-up.  The patient was last seen by Dr. BRogue Bussingon 08/12/2017.  At that time, she denied any fevers.  She had intermittent vaginal spotting.  She felt weak with a poor appetite.  She notes little vaginal discharge.  She denies any fever.  She receives Granix 480 mcg Monday - Friday.  She typically receives platelets and PRBCs on Friday.  Review of Systems  Constitutional: Positive for malaise/fatigue and weight loss (1 pound since last visit). Negative for chills, diaphoresis and fever.  HENT: Negative for nosebleeds and sore throat.   Eyes: Negative for double vision.  Respiratory: Positive for shortness of breath. Negative for cough, hemoptysis, sputum production and wheezing.   Cardiovascular: Negative for chest pain, palpitations, orthopnea and leg swelling.  Gastrointestinal: Negative for abdominal pain, blood in stool, constipation, diarrhea, heartburn, melena, nausea and vomiting.  Genitourinary: Negative for dysuria, frequency and urgency.       Little vaginal discharge.  Musculoskeletal: Negative for back pain and joint pain.  Skin: Negative.  Negative for itching and rash.  Neurological: Negative for dizziness, tingling, focal weakness, weakness and headaches.  Endo/Heme/Allergies: Does not bruise/bleed easily.  Psychiatric/Behavioral: Negative for depression. The patient is not nervous/anxious and does not have insomnia.     PAST MEDICAL HISTORY :  Past Medical History:  Diagnosis Date  .  Blood dyscrasia   . Breast cancer ([place])    right, lumpectomy, radiation, chemo  . Breast cancer ([place])    Right, 2007  . Breast cancer ([place]) 06/20/2016   INVASIVE DUCTAL CARCINOMA.  . Collapsed lung 02/14/2017   RIGHT  . Complication of anesthesia    PT STATED AFTER BIL HIP SURGERIES SHE STOPPED BREATHING THE NIGHT AFTER SURGERY  . COPD (chronic obstructive pulmonary disease) ([place])   . GERD (gastroesophageal reflux disease)   . Headache   . History of chemotherapy   . History of methicillin resistant staphylococcus aureus (MRSA) 2011  . History of radiation therapy   . Hyperlipidemia   . Hypertension   . Liver cancer ([place]) 05/2016  . Lung cancer ([place]) 05/2016  . Osteoarthritis    right hip  . Osteoporosis   . Squamous cell carcinoma    leg, Followed by Dr. Nicole Kindred    PAST SURGICAL HISTORY :   Past Surgical History:  Procedure Laterality Date  . ABDOMINAL HYSTERECTOMY    . BREAST BIOPSY Left 2002   left breast, calcifications  . BREAST BIOPSY Left 06/20/2016   INVASIVE DUCTAL CARCINOMA.  Marland Kitchen BREAST EXCISIONAL BIOPSY Right 2007   positive  . bunion repair    . ESOPHAGOGASTRODUODENOSCOPY (EGD) WITH PROPOFOL N/A 08/05/2016   Procedure: ESOPHAGOGASTRODUODENOSCOPY (EGD) WITH PROPOFOL;  Surgeon: San Jetty, MD;  Location: [facility] ENDOSCOPY;  Service: General;  Laterality: N/A;  . IR FLUORO GUIDE CV LINE RIGHT  07/25/2017  . JOINT REPLACEMENT    . left breast biopsy    . NASAL SINUS SURGERY    . PORTACATH PLACEMENT Right 04/13/2017   Procedure: INSERTION PORT-A-CATH- RIGHT INTERNAL JUGULAR;  Surgeon: Robert Bellow, MD;  Location: [facility] ORS;  Service: General;  Laterality: Right;  . right hip replacement    . SHOULDER SURGERY    . SQUAMOUS CELL CARCINOMA EXCISION     right leg, Dr. Nicole Kindred  . TOTAL HIP ARTHROPLASTY     right    FAMILY HISTORY :   Family History  Problem Relation Age of Onset  . Heart disease Father 9  . Heart disease Mother   . Hyperlipidemia Mother    . Heart disease Brother   . Diabetes Brother   . Diabetes Maternal Grandmother   . Breast cancer Cousin   . Kidney disease Brother     SOCIAL HISTORY:   Social History   Tobacco Use  . Smoking status: Current Some Day Smoker    Packs/day: 0.25    Years: 30.00    Pack years: 7.50    Types: Cigarettes  . Smokeless tobacco: Never Used  Substance Use Topics  . Alcohol use: Not Currently    Alcohol/week: 0.0 oz    Comment: socially  . Drug use: No    ALLERGIES:  is allergic to codeine; fish-derived products; morphine sulfate; adhesive [tape]; and oxycodone.  MEDICATIONS:  Current Outpatient Medications  Medication Sig Dispense Refill  . acyclovir (ZOVIRAX) 400 MG tablet Take 1 tablet (400 mg total) by mouth 2 (two) times daily. 60 tablet 3  . ALPRAZolam (XANAX) 0.5 MG tablet Take 1 tablet (0.5 mg total) by mouth every 8 (eight) hours as needed for anxiety. 30 tablet 2  . cyanocobalamin (,VITAMIN B-12,) 1000 MCG/ML injection Inject 1,000 mcg into the muscle every 30 (thirty) days.    . fexofenadine (ALLEGRA) 180 MG tablet Take 180 mg by mouth every morning.     . fluconazole (DIFLUCAN) 200  MG tablet Take 1 tablet (200 mg total) by mouth daily. 30 tablet 3  . Glucosamine-Chondroitin (COSAMIN DS PO) Take 1 tablet by mouth daily. In the morning.    Marland Kitchen letrozole (FEMARA) 2.5 MG tablet Take 1 tablet (2.5 mg total) by mouth daily. Once a day. 90 tablet 0  . levofloxacin (LEVAQUIN) 500 MG tablet Take 500 mg by mouth daily.    . metoprolol succinate (TOPROL-XL) 25 MG 24 hr tablet Take 1 tablet (25 mg total) by mouth daily. 90 tablet 3  . pravastatin (PRAVACHOL) 40 MG tablet Take 1 tablet (40 mg total) by mouth daily. 90 tablet 3  . Probiotic Product (PROBIOTIC & ACIDOPHILUS EX ST PO) Take 1 tablet by mouth daily.     . prochlorperazine (COMPAZINE) 10 MG tablet Take 1 tablet (10 mg total) by mouth every 6 (six) hours as needed for nausea or vomiting. 30 tablet 2  . ranitidine (ZANTAC) 75  MG tablet Take 75 mg by mouth daily as needed for heartburn.     . Vitamin D, Ergocalciferol, (DRISDOL) 50000 units CAPS capsule TAKE 1 CAPSULE (50,000 UNITS TOTAL) BY MOUTH EVERY SATURDAY. IN THE MORNING. 12 capsule 0   No current facility-administered medications for this visit.    Facility-Administered Medications Ordered in Other Visits  Medication Dose Route Frequency Provider Last Rate Last Dose  . 0.9 %  sodium chloride infusion   Intravenous Continuous [name], MD   Stopped at 06/28/17 1450  . 0.9 %  sodium chloride infusion   Intravenous Continuous [name], MD   Stopped at 07/29/17 1515  . heparin lock flush 100 unit/mL  500 Units Intravenous Once Charlaine Dalton R, MD      . sodium chloride flush (NS) 0.9 % injection 10 mL  10 mL Intravenous PRN Charlaine Dalton R, MD      . sodium chloride flush (NS) 0.9 % injection 10 mL  10 mL Intravenous PRN Verlon Au, NP      . sodium chloride flush (NS) 0.9 % injection 10 mL  10 mL Intravenous PRN [name], MD   10 mL at 07/29/17 1405    PHYSICAL EXAMINATION: ECOG PERFORMANCE STATUS: 1 - Symptomatic but completely ambulatory  BP 121/82 (BP Location: Left Arm)   Pulse (!) 102   Temp (!) 95.4 F (35.2 C) (Tympanic)   Resp 18   Wt 127 lb 2 oz (57.7 kg)   BMI 24.02 kg/m   Filed Weights   08/26/17 1524  Weight: 127 lb 2 oz (57.7 kg)    GENERAL:  Well developed, well nourished, woman sitting comfortably in the exam room in no acute distress.  She is accompanied by her husband. MENTAL STATUS:  Alert and oriented to person, place and time. HEAD:  Normocephalic, atraumatic, face symmetric, no Cushingoid features. EYES: Pupils equal round and reactive to light and accomodation.  No conjunctivitis or scleral icterus. ENT:  Oropharynx clear without lesion.  Tongue normal. Mucous membranes moist.  RESPIRATORY:  Clear to auscultation without rales, wheezes or rhonchi. CARDIOVASCULAR:   Regular rate and rhythm without murmur, rub or gallop. ABDOMEN:  Soft, non-tender, with active bowel sounds, and no hepatosplenomegaly.  No masses. SKIN:  Lower extremity petechiae.  No rashes, ulcers or lesions. EXTREMITIES: No edema, no skin discoloration or tenderness.  No palpable cords. LYMPH NODES: No palpable cervical, supraclavicular, axillary or inguinal adenopathy  NEUROLOGICAL: Unremarkable. PSYCH:  Appropriate.    LABORATORY DATA:  I have reviewed the  data as listed    Component Value Date/Time   NA 137 08/29/2017 0958   K 3.6 08/29/2017 0958   CL 102 08/29/2017 0958   CO2 25 08/29/2017 0958   GLUCOSE 133 (H) 08/29/2017 0958   BUN 20 08/29/2017 0958   CREATININE 0.99 08/29/2017 0958   CREATININE 0.67 07/09/2014 1321   CREATININE 0.70 04/30/2014 1455   CALCIUM 9.2 08/29/2017 0958   PROT 5.8 (L) 08/05/2017 1052   PROT 7.3 07/09/2014 1321   ALBUMIN 2.4 (L) 08/05/2017 1052   ALBUMIN 4.3 07/09/2014 1321   AST 21 08/05/2017 1052   AST 19 07/09/2014 1321   ALT 24 08/05/2017 1052   ALT 18 07/09/2014 1321   ALKPHOS 51 08/05/2017 1052   ALKPHOS 66 07/09/2014 1321   BILITOT 0.6 08/05/2017 1052   BILITOT 0.6 07/09/2014 1321   GFRNONAA 57 (L) 08/29/2017 0958   GFRNONAA >60 07/09/2014 1321   GFRAA >60 08/29/2017 0958   GFRAA >60 07/09/2014 1321    No results found for: SPEP, UPEP  Lab Results  Component Value Date   WBC 5.1 09/11/2017   NEUTROABS 3.3 09/11/2017   HGB 8.7 (L) 09/11/2017   HCT 24.5 (L) 09/11/2017   MCV 86.0 09/11/2017   PLT 17 (LL) 09/11/2017      Chemistry      Component Value Date/Time   NA 137 08/29/2017 0958   K 3.6 08/29/2017 0958   CL 102 08/29/2017 0958   CO2 25 08/29/2017 0958   BUN 20 08/29/2017 0958   CREATININE 0.99 08/29/2017 0958   CREATININE 0.67 07/09/2014 1321   CREATININE 0.70 04/30/2014 1455      Component Value Date/Time   CALCIUM 9.2 08/29/2017 0958   ALKPHOS 51 08/05/2017 1052   ALKPHOS 66 07/09/2014 1321   AST 21  08/05/2017 1052   AST 19 07/09/2014 1321   ALT 24 08/05/2017 1052   ALT 18 07/09/2014 1321   BILITOT 0.6 08/05/2017 1052   BILITOT 0.6 07/09/2014 1321       RADIOGRAPHIC STUDIES: I have personally reviewed the radiological images as listed and agreed with the findings in the report. No results found.   ASSESSMENT & PLAN:  Highly suspicious for MDS-high-grade; currently status post Dacogen [April 1st 2019].  Currently on further Dacogen hold because of worsening anemia thrombocytopenia neutropenia.  #Severe pancytopenia-continue supportive therapy with PRBC transfusion approximately once a week; platelet transfusions [crossmatched/washed/Solu-Medrol 59m premedication]; Continue Granix Monday through Friday.  Orders in place.  # Consideration of repeat bone marrow biopsy to assess status of bone marrow given ongoing transfusion support.  Discuss evolution of diease. Marrow would assist in making future decisions about treatment options and ongoing supportive care.  Discussed treatment to date.  Consider ATG if marrow aplasia.  Patient remains reluctant to consider repeat bone marrow.  Patient to discuss with Dr. BRogue Bussingon his return.  # Antibiotic prophylaxis secondary to ongoing severe neutropenia- continue levofloxacin 500 milligrams a day/ acyclovir/dilfucan.  Stable.  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 On Femara.  Stable  #  Weight loss- Weight loss of 1 pound since last visit.  Previously recommended recommend increasing protein intake; and also take the Marinol pills as recommended.  #  Vaginal bleeding-now slight discharge likely secondary to vaginal atrophy/low platelets.  Recommend continued lysteda.   # Continue daily GCSF.  #  RTC tomorrow for 2 units platelets and labs (30 minute post platelet check).   #  RTC on  on 08/29/2017 for labs (CBC with diff, BMP, hold tube).   #  RTC on 08/30/2017 for +/- blood and  platelets.    Lequita Asal, MD 08/26/2017 2:00 PM

## 2017-08-27 ENCOUNTER — Inpatient Hospital Stay: Payer: Medicare Other

## 2017-08-27 ENCOUNTER — Ambulatory Visit: Payer: Medicare Other

## 2017-08-27 VITALS — BP 118/68 | HR 94 | Temp 97.0°F | Resp 18

## 2017-08-27 DIAGNOSIS — Z170 Estrogen receptor positive status [ER+]: Secondary | ICD-10-CM

## 2017-08-27 DIAGNOSIS — D696 Thrombocytopenia, unspecified: Secondary | ICD-10-CM

## 2017-08-27 DIAGNOSIS — D693 Immune thrombocytopenic purpura: Secondary | ICD-10-CM

## 2017-08-27 DIAGNOSIS — D709 Neutropenia, unspecified: Secondary | ICD-10-CM | POA: Diagnosis not present

## 2017-08-27 DIAGNOSIS — C50811 Malignant neoplasm of overlapping sites of right female breast: Secondary | ICD-10-CM

## 2017-08-27 DIAGNOSIS — D469 Myelodysplastic syndrome, unspecified: Secondary | ICD-10-CM | POA: Diagnosis not present

## 2017-08-27 DIAGNOSIS — Z7689 Persons encountering health services in other specified circumstances: Secondary | ICD-10-CM | POA: Diagnosis not present

## 2017-08-27 DIAGNOSIS — R609 Edema, unspecified: Secondary | ICD-10-CM | POA: Diagnosis not present

## 2017-08-27 DIAGNOSIS — R040 Epistaxis: Secondary | ICD-10-CM | POA: Diagnosis not present

## 2017-08-27 MED ORDER — SODIUM CHLORIDE 0.9% FLUSH
3.0000 mL | INTRAVENOUS | Status: DC | PRN
  Filled 2017-08-27: qty 3

## 2017-08-27 MED ORDER — ACETAMINOPHEN 325 MG PO TABS
650.0000 mg | ORAL_TABLET | Freq: Once | ORAL | Status: AC
  Administered 2017-08-27: 650 mg via ORAL
  Filled 2017-08-27: qty 2

## 2017-08-27 MED ORDER — DIPHENHYDRAMINE HCL 25 MG PO CAPS
25.0000 mg | ORAL_CAPSULE | Freq: Once | ORAL | Status: AC
  Administered 2017-08-27: 25 mg via ORAL
  Filled 2017-08-27: qty 1

## 2017-08-27 MED ORDER — HEPARIN SOD (PORK) LOCK FLUSH 100 UNIT/ML IV SOLN
500.0000 [IU] | Freq: Every day | INTRAVENOUS | Status: AC | PRN
  Filled 2017-08-27: qty 5

## 2017-08-27 MED ORDER — SODIUM CHLORIDE 0.9% FLUSH
10.0000 mL | INTRAVENOUS | Status: DC | PRN
  Administered 2017-08-27: 10 mL via INTRAVENOUS
  Filled 2017-08-27: qty 10

## 2017-08-27 MED ORDER — HEPARIN SOD (PORK) LOCK FLUSH 100 UNIT/ML IV SOLN
500.0000 [IU] | Freq: Once | INTRAVENOUS | Status: AC
  Administered 2017-08-27: 500 [IU] via INTRAVENOUS
  Filled 2017-08-27: qty 5

## 2017-08-27 MED ORDER — METHYLPREDNISOLONE SODIUM SUCC 125 MG IJ SOLR
40.0000 mg | Freq: Once | INTRAMUSCULAR | Status: AC
  Administered 2017-08-27: 40 mg via INTRAVENOUS
  Filled 2017-08-27: qty 2

## 2017-08-27 MED ORDER — TBO-FILGRASTIM 480 MCG/0.8ML ~~LOC~~ SOSY
480.0000 ug | PREFILLED_SYRINGE | Freq: Once | SUBCUTANEOUS | Status: AC
  Administered 2017-08-27: 480 ug via SUBCUTANEOUS

## 2017-08-28 ENCOUNTER — Encounter: Payer: Self-pay | Admitting: Internal Medicine

## 2017-08-28 ENCOUNTER — Inpatient Hospital Stay: Payer: Medicare Other

## 2017-08-28 DIAGNOSIS — D693 Immune thrombocytopenic purpura: Secondary | ICD-10-CM

## 2017-08-28 DIAGNOSIS — Z170 Estrogen receptor positive status [ER+]: Secondary | ICD-10-CM

## 2017-08-28 DIAGNOSIS — D469 Myelodysplastic syndrome, unspecified: Secondary | ICD-10-CM | POA: Diagnosis not present

## 2017-08-28 DIAGNOSIS — R609 Edema, unspecified: Secondary | ICD-10-CM | POA: Diagnosis not present

## 2017-08-28 DIAGNOSIS — Z7689 Persons encountering health services in other specified circumstances: Secondary | ICD-10-CM | POA: Diagnosis not present

## 2017-08-28 DIAGNOSIS — C50811 Malignant neoplasm of overlapping sites of right female breast: Secondary | ICD-10-CM | POA: Diagnosis not present

## 2017-08-28 DIAGNOSIS — D709 Neutropenia, unspecified: Secondary | ICD-10-CM | POA: Diagnosis not present

## 2017-08-28 DIAGNOSIS — R040 Epistaxis: Secondary | ICD-10-CM | POA: Diagnosis not present

## 2017-08-28 LAB — PREPARE PLATELET PHERESIS
UNIT DIVISION: 0
UNIT DIVISION: 0

## 2017-08-28 LAB — BPAM PLATELET PHERESIS
BLOOD PRODUCT EXPIRATION DATE: 201906181450
Blood Product Expiration Date: 201906181450
ISSUE DATE / TIME: 201906181418
ISSUE DATE / TIME: 201906181418
UNIT TYPE AND RH: 5100
Unit Type and Rh: 5100

## 2017-08-28 MED ORDER — TBO-FILGRASTIM 480 MCG/0.8ML ~~LOC~~ SOSY
480.0000 ug | PREFILLED_SYRINGE | Freq: Once | SUBCUTANEOUS | Status: AC
  Administered 2017-08-28: 480 ug via SUBCUTANEOUS

## 2017-08-29 ENCOUNTER — Inpatient Hospital Stay: Payer: Medicare Other

## 2017-08-29 ENCOUNTER — Telehealth: Payer: Self-pay | Admitting: *Deleted

## 2017-08-29 ENCOUNTER — Other Ambulatory Visit: Payer: Self-pay

## 2017-08-29 DIAGNOSIS — C50811 Malignant neoplasm of overlapping sites of right female breast: Secondary | ICD-10-CM | POA: Diagnosis not present

## 2017-08-29 DIAGNOSIS — D709 Neutropenia, unspecified: Secondary | ICD-10-CM | POA: Diagnosis not present

## 2017-08-29 DIAGNOSIS — Z7689 Persons encountering health services in other specified circumstances: Secondary | ICD-10-CM | POA: Diagnosis not present

## 2017-08-29 DIAGNOSIS — R609 Edema, unspecified: Secondary | ICD-10-CM | POA: Diagnosis not present

## 2017-08-29 DIAGNOSIS — D696 Thrombocytopenia, unspecified: Secondary | ICD-10-CM

## 2017-08-29 DIAGNOSIS — D469 Myelodysplastic syndrome, unspecified: Secondary | ICD-10-CM | POA: Diagnosis not present

## 2017-08-29 DIAGNOSIS — R040 Epistaxis: Secondary | ICD-10-CM | POA: Diagnosis not present

## 2017-08-29 LAB — CBC WITH DIFFERENTIAL/PLATELET
Basophils Absolute: 0 10*3/uL (ref 0–0.1)
Basophils Relative: 1 %
Eosinophils Absolute: 0 10*3/uL (ref 0–0.7)
Eosinophils Relative: 0 %
HCT: 23 % — ABNORMAL LOW (ref 35.0–47.0)
Hemoglobin: 8.2 g/dL — ABNORMAL LOW (ref 12.0–16.0)
Lymphocytes Relative: 41 %
Lymphs Abs: 1.6 10*3/uL (ref 1.0–3.6)
MCH: 32 pg (ref 26.0–34.0)
MCHC: 35.6 g/dL (ref 32.0–36.0)
MCV: 89.9 fL (ref 80.0–100.0)
Monocytes Absolute: 0.3 10*3/uL (ref 0.2–0.9)
Monocytes Relative: 8 %
Neutro Abs: 1.9 10*3/uL (ref 1.4–6.5)
Neutrophils Relative %: 50 %
Platelets: 84 10*3/uL — ABNORMAL LOW (ref 150–440)
RBC: 2.56 MIL/uL — ABNORMAL LOW (ref 3.80–5.20)
RDW: 13.3 % (ref 11.5–14.5)
WBC: 3.8 10*3/uL (ref 3.6–11.0)

## 2017-08-29 LAB — BASIC METABOLIC PANEL
Anion gap: 10 (ref 5–15)
BUN: 20 mg/dL (ref 6–20)
CO2: 25 mmol/L (ref 22–32)
Calcium: 9.2 mg/dL (ref 8.9–10.3)
Chloride: 102 mmol/L (ref 101–111)
Creatinine, Ser: 0.99 mg/dL (ref 0.44–1.00)
GFR calc Af Amer: >60 mL/min (ref >60)
GFR calc non Af Amer: 57 mL/min — ABNORMAL LOW (ref >60)
Glucose, Bld: 133 mg/dL — ABNORMAL HIGH (ref 65–99)
Potassium: 3.6 mmol/L (ref 3.5–5.1)
Sodium: 137 mmol/L (ref 135–145)

## 2017-08-29 LAB — SAMPLE TO BLOOD BANK

## 2017-08-29 NOTE — Telephone Encounter (Signed)
Received phone call from brittany, [place]. Pt's ANC 1.9; plts - 84; hgb - 8.2. granix to be held today per lab perimeters. I spoke with Gaspar Bidding, NP. Will plan to hold granix tomorrow. Will cnl tomorrow's apts in cancer center. Pt provided with new apts for the next 2 weeks. Pt aware of the plan of care.  Will recheck labs on Monday.  Patient's husband wanted to know the timing of the next bone marrow biopsy. Per husband, pt will need to be off granix for a certain time period prior to bone marrow bx.  I explained to patient/pt's husband that Dr. Jacinto Reap will be back in the office on Monday 6/24. I will ask md about this at that time. Pt/pt's husband gave verbal understanding.

## 2017-08-30 ENCOUNTER — Encounter: Payer: Self-pay | Admitting: Internal Medicine

## 2017-08-30 ENCOUNTER — Inpatient Hospital Stay: Payer: Medicare Other

## 2017-09-02 ENCOUNTER — Other Ambulatory Visit: Payer: Self-pay

## 2017-09-02 ENCOUNTER — Inpatient Hospital Stay: Payer: Medicare Other

## 2017-09-02 ENCOUNTER — Telehealth: Payer: Self-pay | Admitting: Internal Medicine

## 2017-09-02 DIAGNOSIS — D469 Myelodysplastic syndrome, unspecified: Secondary | ICD-10-CM

## 2017-09-02 DIAGNOSIS — R040 Epistaxis: Secondary | ICD-10-CM | POA: Diagnosis not present

## 2017-09-02 DIAGNOSIS — Z7689 Persons encountering health services in other specified circumstances: Secondary | ICD-10-CM | POA: Diagnosis not present

## 2017-09-02 DIAGNOSIS — D709 Neutropenia, unspecified: Secondary | ICD-10-CM | POA: Diagnosis not present

## 2017-09-02 DIAGNOSIS — C50811 Malignant neoplasm of overlapping sites of right female breast: Secondary | ICD-10-CM | POA: Diagnosis not present

## 2017-09-02 DIAGNOSIS — R609 Edema, unspecified: Secondary | ICD-10-CM | POA: Diagnosis not present

## 2017-09-02 LAB — CBC WITH DIFFERENTIAL/PLATELET
Basophils Absolute: 0 10*3/uL (ref 0–0.1)
Basophils Relative: 0 %
Eosinophils Absolute: 0 10*3/uL (ref 0–0.7)
Eosinophils Relative: 0 %
HEMATOCRIT: 21 % — AB (ref 35.0–47.0)
HEMOGLOBIN: 7.5 g/dL — AB (ref 12.0–16.0)
LYMPHS PCT: 58 %
Lymphs Abs: 1.4 10*3/uL (ref 1.0–3.6)
MCH: 32.2 pg (ref 26.0–34.0)
MCHC: 35.7 g/dL (ref 32.0–36.0)
MCV: 90.2 fL (ref 80.0–100.0)
MONO ABS: 0.2 10*3/uL (ref 0.2–0.9)
Monocytes Relative: 8 %
NEUTROS ABS: 0.8 10*3/uL — AB (ref 1.4–6.5)
Neutrophils Relative %: 34 %
Platelets: 21 10*3/uL — CL (ref 150–400)
RBC: 2.33 MIL/uL — ABNORMAL LOW (ref 3.80–5.20)
RDW: 13.2 % (ref 11.5–14.5)
WBC: 2.5 10*3/uL — ABNORMAL LOW (ref 3.6–11.0)

## 2017-09-02 LAB — SAMPLE TO BLOOD BANK

## 2017-09-02 NOTE — Telephone Encounter (Signed)
Spoke to pt; Continue granix every day [M-F]; PRBC transfusion tomorrow Await repeat CBC on 6/27- re: possible platelet transfusion Will order BMBx for next week- under anesthesia. Please schedule.

## 2017-09-03 ENCOUNTER — Inpatient Hospital Stay (HOSPITAL_BASED_OUTPATIENT_CLINIC_OR_DEPARTMENT_OTHER): Payer: Medicare Other | Admitting: Nurse Practitioner

## 2017-09-03 ENCOUNTER — Encounter: Payer: Self-pay | Admitting: Nurse Practitioner

## 2017-09-03 ENCOUNTER — Inpatient Hospital Stay: Payer: Medicare Other

## 2017-09-03 VITALS — BP 133/80 | HR 103 | Temp 98.2°F | Resp 20

## 2017-09-03 DIAGNOSIS — C50811 Malignant neoplasm of overlapping sites of right female breast: Secondary | ICD-10-CM | POA: Diagnosis not present

## 2017-09-03 DIAGNOSIS — R609 Edema, unspecified: Secondary | ICD-10-CM | POA: Diagnosis not present

## 2017-09-03 DIAGNOSIS — D709 Neutropenia, unspecified: Secondary | ICD-10-CM | POA: Diagnosis not present

## 2017-09-03 DIAGNOSIS — R630 Anorexia: Secondary | ICD-10-CM

## 2017-09-03 DIAGNOSIS — D469 Myelodysplastic syndrome, unspecified: Secondary | ICD-10-CM | POA: Diagnosis not present

## 2017-09-03 DIAGNOSIS — Z7689 Persons encountering health services in other specified circumstances: Secondary | ICD-10-CM | POA: Diagnosis not present

## 2017-09-03 DIAGNOSIS — Z170 Estrogen receptor positive status [ER+]: Secondary | ICD-10-CM

## 2017-09-03 DIAGNOSIS — D693 Immune thrombocytopenic purpura: Secondary | ICD-10-CM

## 2017-09-03 DIAGNOSIS — I447 Left bundle-branch block, unspecified: Secondary | ICD-10-CM | POA: Diagnosis not present

## 2017-09-03 DIAGNOSIS — R040 Epistaxis: Secondary | ICD-10-CM | POA: Diagnosis not present

## 2017-09-03 DIAGNOSIS — Z79811 Long term (current) use of aromatase inhibitors: Secondary | ICD-10-CM | POA: Diagnosis not present

## 2017-09-03 DIAGNOSIS — Z803 Family history of malignant neoplasm of breast: Secondary | ICD-10-CM | POA: Diagnosis not present

## 2017-09-03 DIAGNOSIS — M7989 Other specified soft tissue disorders: Secondary | ICD-10-CM

## 2017-09-03 LAB — PREPARE RBC (CROSSMATCH)

## 2017-09-03 MED ORDER — ACETAMINOPHEN 325 MG PO TABS
650.0000 mg | ORAL_TABLET | Freq: Once | ORAL | Status: AC
  Administered 2017-09-03: 650 mg via ORAL
  Filled 2017-09-03: qty 2

## 2017-09-03 MED ORDER — DIPHENHYDRAMINE HCL 25 MG PO CAPS
25.0000 mg | ORAL_CAPSULE | Freq: Once | ORAL | Status: AC
  Administered 2017-09-03: 25 mg via ORAL
  Filled 2017-09-03: qty 1

## 2017-09-03 MED ORDER — CYANOCOBALAMIN 1000 MCG/ML IJ SOLN
1000.0000 ug | Freq: Once | INTRAMUSCULAR | Status: AC
  Administered 2017-09-03: 1000 ug via INTRAMUSCULAR
  Filled 2017-09-03: qty 1

## 2017-09-03 MED ORDER — TBO-FILGRASTIM 480 MCG/0.8ML ~~LOC~~ SOSY
480.0000 ug | PREFILLED_SYRINGE | Freq: Once | SUBCUTANEOUS | Status: AC
  Administered 2017-09-03: 480 ug via SUBCUTANEOUS

## 2017-09-03 MED ORDER — HEPARIN SOD (PORK) LOCK FLUSH 100 UNIT/ML IV SOLN
INTRAVENOUS | Status: AC
  Filled 2017-09-03: qty 5

## 2017-09-03 NOTE — Progress Notes (Signed)
Shelly Arias will receive 1 unit of PRBC's, granix and Vitamin B12 today per Dr. Rogue Bussing. Pt complains for edema in both feet. Dr. Rogue Bussing is aware. Lauren, NP will come to assess patient before she leaves today.

## 2017-09-03 NOTE — Telephone Encounter (Signed)
Scheduling worksheet faxed to spec. Schedulers for bone marrow bx

## 2017-09-03 NOTE — Progress Notes (Signed)
Symptom Management Shelly Sebastian  [phone] [phone]  Patient Care Team: Shelly Haven, MD as PCP - General (Family Medicine) Shelly Arias, DPM as Consulting Physician (Podiatry) Shelly Sickle, MD as Medical Oncologist (Hematology and Oncology) Shelly Castilla Forest Gleason, MD (General Surgery)   Name of the patient: Shelly Arias  [id number]  11-May-1945   Date of visit: 09/03/17  Diagnosis- Breast Cancer & MDS  Chief complaint/ Reason for visit- leg swelling  Heme/Onc history:  Oncology History   # April 2018- Left chest wall Bx- [facility]; ER-PR-POS; her 2 Neu NEG; PET- mild hilar/distal adenopathy; ~1 cm right lung nodule/ several sub-centimeter.   # May 2018- cont [facility]; Abemacliclib [on HOLD sec to cytopenia]; MARCH 2019- PET near CR from breast cancer  # April 2017-isolated Thrombocytopenia- platelets- 113; worsening PANCYTOPENIA- April 2018- BMBx- no evidence of malignancy; MDS/Acute leuk; Karyotype/MDS- FISH panel-NEG [d/w Shelly Arias];   # June 2018- REPEAT BMBx/[facility] [June 2018-Shelly Arias; Aug 2018- Shelly Arias at [facility]]; No Diagnosis.   # April 11, 2017 [third bone marrow]-is still inconclusive; no obvious evidence of acute leukemia or obvious MDS except for mild dysplastic changes; and significantly reduced megakaryocytes. 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 [reviwed at [facility]; [facility]-NEG]  # April 1st 2019- Dacogen   # May 29th 2018- N-plate- suboptimal improvement; NOV 1st week start promacta 50 mg/day; suboptimal response; DEC 1st- start promacta 75 mg/day  # DEC 18th-INCREASE PROMACTA to 150mg /day  # 2006- RIGHT BREAST CA [[id number]; STAGE I; ER/PRPos; Her 2 Neu-NEG] s/p FEC x 6 [NSABP B-36]; Femara [Dec 2007-2012]  # Osteoporosis s/p Reclast; BMD- 2015-wnl- ca+vit D   Shelly Arias [derm ? Squamous cell s/p freeze-oct  2018] --------------------------------------------------------------  DIAGNOSIS: Shelly Arias 2017 ] # ? MDS /Meta.Breast cancer- ER/PR pos; her-NEG  STAGE:  IV  ;GOALS: palliative  CURRENT/MOST RECENT THERAPY: dacogen [April 2019]; Letrozole [May 2017];   # Pt needs PLATELETS- WASHED/ CROSS-MATCHED PLATELETS        Carcinoma of overlapping sites of right breast in female, estrogen receptor positive ([place])    MDS (myelodysplastic syndrome) (HCC)     Interval history- Patient complains of edema/swelling of the feet and ankles. She noticed symptoms over past week and they have improved. She rates severity as mild. Worsens when she eats large amounts of salt which she has been doing due to lack of taste and weight loss. No pain, sob, heat, redness, coolness. No changes to sensation. Ambulating without difficulty. Improves with elevation and ambulation.   ECOG FS:1 - Symptomatic but completely ambulatory  Review of systems- Review of Systems  Constitutional: Positive for weight loss.  Eyes: Negative.   Respiratory: Negative.   Cardiovascular: Positive for leg swelling (now resolved). Negative for chest pain, palpitations, orthopnea and claudication.  Gastrointestinal: Negative.   Musculoskeletal: Negative.   Skin: Negative.   Neurological: Negative.   Endo/Heme/Allergies: Bruises/bleeds easily.  Psychiatric/Behavioral: Negative.      Allergies  Allergen Reactions  . Codeine Anaphylaxis  . Fish-Derived Products Anaphylaxis  . Morphine Sulfate Anaphylaxis    REACTION: anaphylaxis  . Adhesive [Tape]     PAPER TAPE OK TO USE  . Oxycodone     Nausea and vomiting     Past Medical History:  Diagnosis Date  . Blood dyscrasia   . Breast cancer ([place])    right, lumpectomy, radiation, chemo  . Breast cancer ([place])    Right, 2007  .  Breast cancer ([place]) 06/20/2016   INVASIVE DUCTAL CARCINOMA.  . Collapsed lung 02/14/2017   RIGHT  . Complication of anesthesia    PT STATED AFTER  BIL HIP SURGERIES SHE STOPPED BREATHING THE NIGHT AFTER SURGERY  . COPD (chronic obstructive pulmonary disease) ([place])   . GERD (gastroesophageal reflux disease)   . Headache   . History of chemotherapy   . History of methicillin resistant staphylococcus aureus (MRSA) 2011  . History of radiation therapy   . Hyperlipidemia   . Hypertension   . Liver cancer ([place]) 05/2016  . Lung cancer ([place]) 05/2016  . Osteoarthritis    right hip  . Osteoporosis   . Squamous cell carcinoma    leg, Followed by Shelly Arias     Past Surgical History:  Procedure Laterality Date  . ABDOMINAL HYSTERECTOMY    . BREAST BIOPSY Left 2002   left breast, calcifications  . BREAST BIOPSY Left 06/20/2016   INVASIVE DUCTAL CARCINOMA.  Marland Kitchen BREAST EXCISIONAL BIOPSY Right 2007   positive  . bunion repair    . ESOPHAGOGASTRODUODENOSCOPY (EGD) WITH PROPOFOL N/A 08/05/2016   Procedure: ESOPHAGOGASTRODUODENOSCOPY (EGD) WITH PROPOFOL;  Surgeon: Shelly Jetty, MD;  Location: [facility] ENDOSCOPY;  Service: General;  Laterality: N/A;  . IR FLUORO GUIDE CV LINE RIGHT  07/25/2017  . JOINT REPLACEMENT    . left breast biopsy    . NASAL SINUS SURGERY    . PORTACATH PLACEMENT Right 04/13/2017   Procedure: INSERTION PORT-A-CATH- RIGHT INTERNAL JUGULAR;  Surgeon: Shelly Bellow, MD;  Location: [facility] ORS;  Service: General;  Laterality: Right;  . right hip replacement    . SHOULDER SURGERY    . SQUAMOUS CELL CARCINOMA EXCISION     right leg, Shelly Arias  . TOTAL HIP ARTHROPLASTY     right    Social History   Socioeconomic History  . Marital status: Married    Spouse name: Not on file  . Number of children: Not on file  . Years of education: Not on file  . Highest education level: Not on file  Occupational History  . Not on file  Social Needs  . Financial resource strain: Not hard at all  . Food insecurity:    Worry: Never true    Inability: Never true  . Transportation needs:    Medical: No    Non-medical: No    Tobacco Use  . Smoking status: Current Some Day Smoker    Packs/day: 0.10    Years: 30.00    Pack years: 3.00    Types: Cigarettes  . Smokeless tobacco: Never Used  Substance and Sexual Activity  . Alcohol use: Yes    Alcohol/week: 0.0 oz    Comment: socially  . Drug use: No  . Sexual activity: Never  Lifestyle  . Physical activity:    Days per week: 4 days    Minutes per session: 30 min  . Stress: Not at all  Relationships  . Social connections:    Talks on phone: Three times a week    Gets together: Twice a week    Attends religious service: More than 4 times per year    Active member of club or organization: No    Attends meetings of clubs or organizations: Never    Relationship status: Married  . Intimate partner violence:    Fear of current or ex partner: No    Emotionally abused: No    Physically abused: No    Forced sexual activity: No  Other Topics Concern  . Not on file  Social History Narrative  . Not on file    Family History  Problem Relation Age of Onset  . Heart disease Father 79  . Heart disease Mother   . Hyperlipidemia Mother   . Heart disease Brother   . Diabetes Brother   . Diabetes Maternal Grandmother   . Breast cancer Cousin   . Kidney disease Brother      Current Outpatient Medications:  .  acyclovir (ZOVIRAX) 400 MG tablet, Take 1 tablet (400 mg total) by mouth 2 (two) times daily., Disp: 60 tablet, Rfl: 3 .  ALPRAZolam (XANAX) 0.5 MG tablet, Take 1 tablet (0.5 mg total) by mouth every 8 (eight) hours as needed for anxiety., Disp: 30 tablet, Rfl: 2 .  cyanocobalamin (,VITAMIN B-12,) 1000 MCG/ML injection, Inject 1,000 mcg into the muscle every 30 (thirty) days., Disp: , Rfl:  .  fexofenadine (ALLEGRA) 180 MG tablet, Take 180 mg by mouth every morning. , Disp: , Rfl:  .  fluconazole (DIFLUCAN) 200 MG tablet, Take 1 tablet (200 mg total) by mouth daily., Disp: 30 tablet, Rfl: 3 .  Glucosamine-Chondroitin (COSAMIN DS PO), Take 1 tablet by  mouth daily. In the morning., Disp: , Rfl:  .  letrozole (FEMARA) 2.5 MG tablet, Take 1 tablet (2.5 mg total) by mouth daily. Once a day., Disp: 90 tablet, Rfl: 0 .  levofloxacin (LEVAQUIN) 500 MG tablet, Take 500 mg by mouth daily., Disp: , Rfl:  .  metoprolol succinate (TOPROL-XL) 25 MG 24 hr tablet, Take 1 tablet (25 mg total) by mouth daily., Disp: 90 tablet, Rfl: 3 .  pravastatin (PRAVACHOL) 40 MG tablet, Take 1 tablet (40 mg total) by mouth daily., Disp: 90 tablet, Rfl: 3 .  Probiotic Product (PROBIOTIC & ACIDOPHILUS EX ST PO), Take 1 tablet by mouth daily. , Disp: , Rfl:  .  prochlorperazine (COMPAZINE) 10 MG tablet, Take 1 tablet (10 mg total) by mouth every 6 (six) hours as needed for nausea or vomiting., Disp: 30 tablet, Rfl: 2 .  ranitidine (ZANTAC) 75 MG tablet, Take 75 mg by mouth daily as needed for heartburn. , Disp: , Rfl:  .  Vitamin D, Ergocalciferol, (DRISDOL) 50000 units CAPS capsule, TAKE 1 CAPSULE (50,000 UNITS TOTAL) BY MOUTH EVERY SATURDAY. IN THE MORNING., Disp: 12 capsule, Rfl: 0 No current facility-administered medications for this visit.   Facility-Administered Medications Ordered in Other Visits:  .  0.9 %  sodium chloride infusion, , Intravenous, Continuous, Shelly Sickle, MD, Stopped at 06/28/17 1450 .  0.9 %  sodium chloride infusion, , Intravenous, Continuous, Shelly Sickle, MD, Stopped at 07/29/17 1515 .  heparin lock flush 100 unit/mL, 500 Units, Intravenous, Once, Brahmanday, Lenetta Quaker R, MD .  sodium chloride flush (NS) 0.9 % injection 10 mL, 10 mL, Intravenous, PRN, Charlaine Dalton R, MD .  sodium chloride flush (NS) 0.9 % injection 10 mL, 10 mL, Intravenous, PRN, Verlon Au, NP .  sodium chloride flush (NS) 0.9 % injection 10 mL, 10 mL, Intravenous, PRN, Shelly Sickle, MD, 10 mL at 07/29/17 1405  Physical exam: There were no vitals filed for this visit. Physical Exam  Constitutional: She is oriented to person, place, and time.  She appears well-developed and well-nourished.  HENT:  Head: Atraumatic.  Nose: Nose normal.  Mouth/Throat: Oropharynx is clear and moist. No oropharyngeal exudate.  Eyes: Conjunctivae are normal. No scleral icterus.  Neck: Normal range of motion.  Cardiovascular: Regular rhythm,  normal heart sounds and intact distal pulses.  Pulmonary/Chest: Effort normal and breath sounds normal.  Abdominal: Soft. Bowel sounds are normal.  Musculoskeletal: She exhibits no edema.       Right knee: She exhibits no swelling and no erythema.       Left knee: She exhibits no swelling and no erythema.       Right ankle: She exhibits normal range of motion, no swelling, no ecchymosis, no deformity and normal pulse.       Left ankle: She exhibits normal range of motion, no swelling, no ecchymosis, no deformity and normal pulse.       Right lower leg: She exhibits no swelling.       Left lower leg: She exhibits no swelling.       Right foot: There is no swelling.       Left foot: There is no swelling.  Neurological: She is alert and oriented to person, place, and time.  Skin: Skin is warm and dry.  Psychiatric: She has a normal mood and affect.     CMP Latest Ref Rng & Units 08/29/2017  Glucose 65 - 99 mg/dL 133(H)  BUN 6 - 20 mg/dL 20  Creatinine 0.44 - 1.00 mg/dL 0.99  Sodium 135 - 145 mmol/L 137  Potassium 3.5 - 5.1 mmol/L 3.6  Chloride 101 - 111 mmol/L 102  CO2 22 - 32 mmol/L 25  Calcium 8.9 - 10.3 mg/dL 9.2  Total Protein 6.5 - 8.1 g/dL -  Total Bilirubin 0.3 - 1.2 mg/dL -  Alkaline Phos 38 - 126 U/L -  AST 15 - 41 U/L -  ALT 14 - 54 U/L -   CBC Latest Ref Rng & Units 09/02/2017  WBC 3.6 - 11.0 K/uL 2.5(L)  Hemoglobin 12.0 - 16.0 g/dL 7.5(L)  Hematocrit 35.0 - 47.0 % 21.0(L)  Platelets 150 - 400 K/uL 21(LL)    No images are attached to the encounter.  US Pelvic Complete With Transvaginal  Result Date: 08/05/2017 CLINICAL DATA:  Vaginal bleeding for 1 day. Status post hysterectomy.  History of right breast cancer. EXAM: TRANSABDOMINAL AND TRANSVAGINAL ULTRASOUND OF PELVIS TECHNIQUE: Both transabdominal and transvaginal ultrasound examinations of the pelvis were performed. Transabdominal technique was performed for global imaging of the pelvis including uterus, ovaries, adnexal regions, and pelvic cul-de-sac. It was necessary to proceed with endovaginal exam following the transabdominal exam to visualize the ovary/adnexa. COMPARISON:  PET 06/03/2017 FINDINGS: Uterus Surgically absent Right ovary Surgically absent Left ovary Not visualized Other findings No abnormal free fluid. IMPRESSION: Hysterectomy and right oophorectomy. No explanation for vaginal bleeding. Left ovary not visualized. Electronically Signed   By: Abigail Miyamoto M.D.   On: 08/05/2017 15:47     Assessment and plan- Patient is a 72 y.o. female with MDS who presents to [facility] for leg swelling.   1. Leg Swelling- bilateral lower extremity edema appeared after increased consumption of high salt foods. No edema today. Findings not consistent with thrombus. Discussed conservative measures including ambulation, elevation, and compression. Could consider reducing salt intake but she has had trouble eating and suffered significant weight loss so I am reluctant to have her hold salt at this time although symptoms are likely related. Follow up with Dr. Rogue Bussing and continue to monitor at this time. If symptoms worsen or do not improve, please call clinic.    Visit Diagnosis 1. MDS (myelodysplastic syndrome) (HCC)   2. Leg swelling     Patient expressed understanding and was in agreement  with this plan. She also understands that She can call clinic at any time with any questions, concerns, or complaints.    Beckey Rutter, DNP, AGNP-C [place] at [facility] [phone] (work cell) [phone] (office) 09/03/17 6:31 PM

## 2017-09-04 ENCOUNTER — Inpatient Hospital Stay: Payer: Medicare Other

## 2017-09-04 DIAGNOSIS — R609 Edema, unspecified: Secondary | ICD-10-CM | POA: Diagnosis not present

## 2017-09-04 DIAGNOSIS — Z170 Estrogen receptor positive status [ER+]: Secondary | ICD-10-CM

## 2017-09-04 DIAGNOSIS — Z7689 Persons encountering health services in other specified circumstances: Secondary | ICD-10-CM | POA: Diagnosis not present

## 2017-09-04 DIAGNOSIS — C50811 Malignant neoplasm of overlapping sites of right female breast: Secondary | ICD-10-CM

## 2017-09-04 DIAGNOSIS — D709 Neutropenia, unspecified: Secondary | ICD-10-CM | POA: Diagnosis not present

## 2017-09-04 DIAGNOSIS — R040 Epistaxis: Secondary | ICD-10-CM | POA: Diagnosis not present

## 2017-09-04 DIAGNOSIS — D693 Immune thrombocytopenic purpura: Secondary | ICD-10-CM

## 2017-09-04 DIAGNOSIS — D469 Myelodysplastic syndrome, unspecified: Secondary | ICD-10-CM | POA: Diagnosis not present

## 2017-09-04 LAB — BPAM RBC
BLOOD PRODUCT EXPIRATION DATE: 201907192359
Blood Product Expiration Date: 201907052359
ISSUE DATE / TIME: 201906251357
ISSUE DATE / TIME: 201906260037
UNIT TYPE AND RH: 5100
Unit Type and Rh: 5100

## 2017-09-04 LAB — TYPE AND SCREEN
ABO/RH(D): O POS
ANTIBODY SCREEN: POSITIVE
Donor AG Type: NEGATIVE
UNIT DIVISION: 0
Unit division: 0

## 2017-09-04 MED ORDER — TBO-FILGRASTIM 480 MCG/0.8ML ~~LOC~~ SOSY
480.0000 ug | PREFILLED_SYRINGE | Freq: Once | SUBCUTANEOUS | Status: AC
  Administered 2017-09-04: 480 ug via SUBCUTANEOUS

## 2017-09-05 ENCOUNTER — Encounter: Payer: Self-pay | Admitting: Nurse Practitioner

## 2017-09-05 ENCOUNTER — Encounter: Payer: Self-pay | Admitting: Internal Medicine

## 2017-09-05 ENCOUNTER — Other Ambulatory Visit: Payer: Self-pay | Admitting: *Deleted

## 2017-09-05 ENCOUNTER — Inpatient Hospital Stay: Payer: Medicare Other

## 2017-09-05 ENCOUNTER — Inpatient Hospital Stay (HOSPITAL_BASED_OUTPATIENT_CLINIC_OR_DEPARTMENT_OTHER): Payer: Medicare Other | Admitting: Nurse Practitioner

## 2017-09-05 VITALS — BP 140/79 | HR 107 | Temp 96.4°F | Resp 18 | Wt 127.0 lb

## 2017-09-05 DIAGNOSIS — R630 Anorexia: Secondary | ICD-10-CM

## 2017-09-05 DIAGNOSIS — D696 Thrombocytopenia, unspecified: Secondary | ICD-10-CM

## 2017-09-05 DIAGNOSIS — Z79811 Long term (current) use of aromatase inhibitors: Secondary | ICD-10-CM

## 2017-09-05 DIAGNOSIS — Z803 Family history of malignant neoplasm of breast: Secondary | ICD-10-CM | POA: Diagnosis not present

## 2017-09-05 DIAGNOSIS — R040 Epistaxis: Secondary | ICD-10-CM | POA: Diagnosis not present

## 2017-09-05 DIAGNOSIS — D469 Myelodysplastic syndrome, unspecified: Secondary | ICD-10-CM

## 2017-09-05 DIAGNOSIS — R609 Edema, unspecified: Secondary | ICD-10-CM

## 2017-09-05 DIAGNOSIS — I447 Left bundle-branch block, unspecified: Secondary | ICD-10-CM

## 2017-09-05 DIAGNOSIS — Z7689 Persons encountering health services in other specified circumstances: Secondary | ICD-10-CM | POA: Diagnosis not present

## 2017-09-05 DIAGNOSIS — Z170 Estrogen receptor positive status [ER+]: Secondary | ICD-10-CM

## 2017-09-05 DIAGNOSIS — N898 Other specified noninflammatory disorders of vagina: Secondary | ICD-10-CM | POA: Diagnosis not present

## 2017-09-05 DIAGNOSIS — D709 Neutropenia, unspecified: Secondary | ICD-10-CM

## 2017-09-05 DIAGNOSIS — C50811 Malignant neoplasm of overlapping sites of right female breast: Secondary | ICD-10-CM | POA: Diagnosis not present

## 2017-09-05 LAB — CBC WITH DIFFERENTIAL/PLATELET
BASOS PCT: 0 %
Basophils Absolute: 0 10*3/uL (ref 0–0.1)
EOS ABS: 0 10*3/uL (ref 0–0.7)
Eosinophils Relative: 0 %
HCT: 24.4 % — ABNORMAL LOW (ref 35.0–47.0)
HEMOGLOBIN: 8.5 g/dL — AB (ref 12.0–16.0)
Lymphocytes Relative: 26 %
Lymphs Abs: 1.2 10*3/uL (ref 1.0–3.6)
MCH: 30.9 pg (ref 26.0–34.0)
MCHC: 35 g/dL (ref 32.0–36.0)
MCV: 88.3 fL (ref 80.0–100.0)
MONO ABS: 0.2 10*3/uL (ref 0.2–0.9)
MONOS PCT: 5 %
NEUTROS PCT: 69 %
Neutro Abs: 3.1 10*3/uL (ref 1.4–6.5)
RBC: 2.76 MIL/uL — ABNORMAL LOW (ref 3.80–5.20)
RDW: 13.2 % (ref 11.5–14.5)
WBC: 4.5 10*3/uL (ref 3.6–11.0)

## 2017-09-05 LAB — SAMPLE TO BLOOD BANK

## 2017-09-05 NOTE — Progress Notes (Signed)
Symptom Management [place]  [phone] [phone]) [phone]  Patient Care Team: [name], MD as PCP - General (Family Medicine) [name], DPM as Consulting Physician (Podiatry) [name], MD as Medical Oncologist (Hematology and Oncology) Bary Castilla Forest Gleason, MD (General Surgery)   Name of the patient: Shelly Arias  [id number]  March 02, 1946   Date of visit: 09/05/17  Diagnosis- Breast Cancer & MDS  Chief complaint/ Reason for visit- epistaxis   Heme/Onc history:  Oncology History   # April 2018- Left chest wall Bx- [facility]; ER-PR-POS; her 2 Neu NEG; PET- mild hilar/distal adenopathy; ~1 cm right lung nodule/ several sub-centimeter.   # May 2018- cont [facility]; Abemacliclib [on HOLD sec to cytopenia]; MARCH 2019- PET near CR from breast cancer  # April 2017-isolated Thrombocytopenia- platelets- 113; worsening PANCYTOPENIA- April 2018- BMBx- no evidence of malignancy; MDS/Acute leuk; Karyotype/MDS- FISH panel-NEG [d/w Dr.Smir];   # June 2018- REPEAT BMBx/[facility] [June 2018-Dr.Foster; Aug 2018- Dr.Powell at [facility]]; No Diagnosis.   # April 11, 2017 [third bone marrow]-is still inconclusive; no obvious evidence of acute leukemia or obvious MDS except for mild dysplastic changes; and significantly reduced megakaryocytes. 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 [reviwed at [facility]; [facility]-NEG]  # April 1st 2019- Dacogen   # May 29th 2018- N-plate- suboptimal improvement; NOV 1st week start promacta 50 mg/day; suboptimal response; DEC 1st- start promacta 75 mg/day  # DEC 18th-INCREASE PROMACTA to 150mg /day  # 2006- RIGHT BREAST CA [pT1cN0M0; STAGE I; ER/PRPos; Her 2 Neu-NEG] s/p FEC x 6 [NSABP B-36]; Femara [Dec 2007-2012]  # Osteoporosis s/p Reclast; BMD- 2015-wnl- ca+vit D   Dr.Stewart [derm ? Squamous cell s/p freeze-oct  2018] --------------------------------------------------------------  DIAGNOSIS: Shelly Arias 2017 ] # ? MDS /Meta.Breast cancer- ER/PR pos; her-NEG  STAGE:  IV  ;GOALS: palliative  CURRENT/MOST RECENT THERAPY: dacogen [April 2019]; Letrozole [May 2017];   # Pt needs PLATELETS- WASHED/ CROSS-MATCHED PLATELETS        Carcinoma of overlapping sites of right breast in female, estrogen receptor positive ([place])    MDS (myelodysplastic syndrome) (HCC)     Interval history- Patient presents to [facility] with complaints of nose bleed that started at 4am this morning and has not stopped. She reports bleeding from left nostril. She describes the flow as slowing but has not stopped. She has put tissue in her nose and applied pressure with some improvement in bleeding but symptoms have not resolved. She has also tried Afrin and 'blood stop' per Dr. Rogue Bussing during previous visits. Associated symptoms: feet and ankle swelling is worse today than yesterday, petechiae on legs and arms. Denies other bleeding.    ECOG FS:1 - Symptomatic but completely ambulatory  Review of systems- Review of Systems  Constitutional: Positive for weight loss.  HENT: Positive for nosebleeds.   Eyes: Negative.   Respiratory: Negative.   Cardiovascular: Positive for leg swelling (2+ today). Negative for chest pain, palpitations, orthopnea and claudication.  Gastrointestinal: Negative.  Negative for blood in stool and melena.  Musculoskeletal: Negative.   Skin:       Petechiae   Neurological: Negative.  Negative for dizziness.  Endo/Heme/Allergies: Bruises/bleeds easily.  Psychiatric/Behavioral: Negative.      Allergies  Allergen Reactions  . Codeine Anaphylaxis  . Fish-Derived Products Anaphylaxis  . Morphine Sulfate Anaphylaxis    REACTION: anaphylaxis  . Adhesive [Tape]     PAPER TAPE OK TO USE  .  Oxycodone     Nausea and vomiting     Past Medical History:  Diagnosis Date  . Blood dyscrasia   . Breast cancer  ([place])    right, lumpectomy, radiation, chemo  . Breast cancer ([place])    Right, 2007  . Breast cancer ([place]) 06/20/2016   INVASIVE DUCTAL CARCINOMA.  . Collapsed lung 02/14/2017   RIGHT  . Complication of anesthesia    PT STATED AFTER BIL HIP SURGERIES SHE STOPPED BREATHING THE NIGHT AFTER SURGERY  . COPD (chronic obstructive pulmonary disease) ([place])   . GERD (gastroesophageal reflux disease)   . Headache   . History of chemotherapy   . History of methicillin resistant staphylococcus aureus (MRSA) 2011  . History of radiation therapy   . Hyperlipidemia   . Hypertension   . Liver cancer ([place]) 05/2016  . Lung cancer ([place]) 05/2016  . Osteoarthritis    right hip  . Osteoporosis   . Squamous cell carcinoma    leg, Followed by Dr. Nicole Kindred     Past Surgical History:  Procedure Laterality Date  . ABDOMINAL HYSTERECTOMY    . BREAST BIOPSY Left 2002   left breast, calcifications  . BREAST BIOPSY Left 06/20/2016   INVASIVE DUCTAL CARCINOMA.  Marland Kitchen BREAST EXCISIONAL BIOPSY Right 2007   positive  . bunion repair    . ESOPHAGOGASTRODUODENOSCOPY (EGD) WITH PROPOFOL N/A 08/05/2016   Procedure: ESOPHAGOGASTRODUODENOSCOPY (EGD) WITH PROPOFOL;  Surgeon: San Jetty, MD;  Location: [facility] ENDOSCOPY;  Service: General;  Laterality: N/A;  . IR FLUORO GUIDE CV LINE RIGHT  07/25/2017  . JOINT REPLACEMENT    . left breast biopsy    . NASAL SINUS SURGERY    . PORTACATH PLACEMENT Right 04/13/2017   Procedure: INSERTION PORT-A-CATH- RIGHT INTERNAL JUGULAR;  Surgeon: Robert Bellow, MD;  Location: [facility] ORS;  Service: General;  Laterality: Right;  . right hip replacement    . SHOULDER SURGERY    . SQUAMOUS CELL CARCINOMA EXCISION     right leg, Dr. Nicole Kindred  . TOTAL HIP ARTHROPLASTY     right    Social History   Socioeconomic History  . Marital status: Married    Spouse name: Not on file  . Number of children: Not on file  . Years of education: Not on file  . Highest education level: Not on  file  Occupational History  . Not on file  Social Needs  . Financial resource strain: Not hard at all  . Food insecurity:    Worry: Never true    Inability: Never true  . Transportation needs:    Medical: No    Non-medical: No  Tobacco Use  . Smoking status: Current Some Day Smoker    Packs/day: 0.10    Years: 30.00    Pack years: 3.00    Types: Cigarettes  . Smokeless tobacco: Never Used  Substance and Sexual Activity  . Alcohol use: Yes    Alcohol/week: 0.0 oz    Comment: socially  . Drug use: No  . Sexual activity: Never  Lifestyle  . Physical activity:    Days per week: 4 days    Minutes per session: 30 min  . Stress: Not at all  Relationships  . Social connections:    Talks on phone: Three times a week    Gets together: Twice a week    Attends religious service: More than 4 times per year    Active member of club or organization: No    Attends  meetings of clubs or organizations: Never    Relationship status: Married  . Intimate partner violence:    Fear of current or ex partner: No    Emotionally abused: No    Physically abused: No    Forced sexual activity: No  Other Topics Concern  . Not on file  Social History Narrative  . Not on file    Family History  Problem Relation Age of Onset  . Heart disease Father 60  . Heart disease Mother   . Hyperlipidemia Mother   . Heart disease Brother   . Diabetes Brother   . Diabetes Maternal Grandmother   . Breast cancer Cousin   . Kidney disease Brother      Current Outpatient Medications:  .  acyclovir (ZOVIRAX) 400 MG tablet, Take 1 tablet (400 mg total) by mouth 2 (two) times daily., Disp: 60 tablet, Rfl: 3 .  ALPRAZolam (XANAX) 0.5 MG tablet, Take 1 tablet (0.5 mg total) by mouth every 8 (eight) hours as needed for anxiety., Disp: 30 tablet, Rfl: 2 .  cyanocobalamin (,VITAMIN B-12,) 1000 MCG/ML injection, Inject 1,000 mcg into the muscle every 30 (thirty) days., Disp: , Rfl:  .  fexofenadine (ALLEGRA) 180  MG tablet, Take 180 mg by mouth every morning. , Disp: , Rfl:  .  fluconazole (DIFLUCAN) 200 MG tablet, Take 1 tablet (200 mg total) by mouth daily., Disp: 30 tablet, Rfl: 3 .  Glucosamine-Chondroitin (COSAMIN DS PO), Take 1 tablet by mouth daily. In the morning., Disp: , Rfl:  .  letrozole (FEMARA) 2.5 MG tablet, Take 1 tablet (2.5 mg total) by mouth daily. Once a day., Disp: 90 tablet, Rfl: 0 .  levofloxacin (LEVAQUIN) 500 MG tablet, Take 500 mg by mouth daily., Disp: , Rfl:  .  metoprolol succinate (TOPROL-XL) 25 MG 24 hr tablet, Take 1 tablet (25 mg total) by mouth daily., Disp: 90 tablet, Rfl: 3 .  pravastatin (PRAVACHOL) 40 MG tablet, Take 1 tablet (40 mg total) by mouth daily., Disp: 90 tablet, Rfl: 3 .  Probiotic Product (PROBIOTIC & ACIDOPHILUS EX ST PO), Take 1 tablet by mouth daily. , Disp: , Rfl:  .  prochlorperazine (COMPAZINE) 10 MG tablet, Take 1 tablet (10 mg total) by mouth every 6 (six) hours as needed for nausea or vomiting., Disp: 30 tablet, Rfl: 2 .  ranitidine (ZANTAC) 75 MG tablet, Take 75 mg by mouth daily as needed for heartburn. , Disp: , Rfl:  .  Vitamin D, Ergocalciferol, (DRISDOL) 50000 units CAPS capsule, TAKE 1 CAPSULE (50,000 UNITS TOTAL) BY MOUTH EVERY SATURDAY. IN THE MORNING., Disp: 12 capsule, Rfl: 0 No current facility-administered medications for this visit.   Facility-Administered Medications Ordered in Other Visits:  .  0.9 %  sodium chloride infusion, , Intravenous, Continuous, [name], MD, Stopped at 06/28/17 1450 .  0.9 %  sodium chloride infusion, , Intravenous, Continuous, [name], MD, Stopped at 07/29/17 1515 .  heparin lock flush 100 unit/mL, 500 Units, Intravenous, Once, Brahmanday, Lenetta Quaker R, MD .  sodium chloride flush (NS) 0.9 % injection 10 mL, 10 mL, Intravenous, PRN, Charlaine Dalton R, MD .  sodium chloride flush (NS) 0.9 % injection 10 mL, 10 mL, Intravenous, PRN, Verlon Au, NP .  sodium chloride flush  (NS) 0.9 % injection 10 mL, 10 mL, Intravenous, PRN, [name], MD, 10 mL at 07/29/17 1405  Physical exam:  Vitals:   09/05/17 1146  BP: 140/79  Pulse: (!) 107  Resp:  18  Temp: (!) 96.4 F (35.8 C)  TempSrc: Tympanic  Weight: 127 lb (57.6 kg)   Physical Exam  Constitutional: She is oriented to person, place, and time. She appears well-developed and well-nourished.  HENT:  Head: Atraumatic.  Nose: Epistaxis is observed.  Mouth/Throat: Oropharynx is clear and moist. No oropharyngeal exudate.  Eyes: Conjunctivae are normal. No scleral icterus.  Neck: Normal range of motion.  Cardiovascular: Regular rhythm, normal heart sounds and intact distal pulses. Tachycardia present.  Pulses:      Dorsalis pedis pulses are 2+ on the right side, and 2+ on the left side.  Pulmonary/Chest: Effort normal and breath sounds normal.  Abdominal: Soft. Bowel sounds are normal.  Musculoskeletal: She exhibits no edema (2+ BLE pedal, ankle).       Right knee: She exhibits no swelling and no erythema.       Left knee: She exhibits no swelling and no erythema.       Right ankle: She exhibits swelling. She exhibits normal range of motion, no ecchymosis, no deformity and normal pulse.       Left ankle: She exhibits swelling. She exhibits no ecchymosis, no deformity and normal pulse.       Right lower leg: She exhibits no swelling.       Left lower leg: She exhibits no swelling.       Right foot: There is no swelling.       Left foot: There is no swelling.  Neurological: She is alert and oriented to person, place, and time.  Skin: Skin is warm and dry. Bruising, ecchymosis and petechiae noted. There is pallor.  Psychiatric:  Tearful at times     CMP Latest Ref Rng & Units 08/29/2017  Glucose 65 - 99 mg/dL 133(H)  BUN 6 - 20 mg/dL 20  Creatinine 0.44 - 1.00 mg/dL 0.99  Sodium 135 - 145 mmol/L 137  Potassium 3.5 - 5.1 mmol/L 3.6  Chloride 101 - 111 mmol/L 102  CO2 22 - 32 mmol/L 25  Calcium  8.9 - 10.3 mg/dL 9.2  Total Protein 6.5 - 8.1 g/dL -  Total Bilirubin 0.3 - 1.2 mg/dL -  Alkaline Phos 38 - 126 U/L -  AST 15 - 41 U/L -  ALT 14 - 54 U/L -   CBC Latest Ref Rng & Units 09/05/2017  WBC 3.6 - 11.0 K/uL 4.5  Hemoglobin 12.0 - 16.0 g/dL 8.5(L)  Hematocrit 35.0 - 47.0 % 24.4(L)  Platelets 150 - 440 K/uL <5(LL)    No images are attached to the encounter.  No results found.   Assessment and plan- Patient is a 72 y.o. female with MDS who presents to [facility] for Epistaxis.   1. MDS-transfusion dependent.  Plan for repeat bone marrow on Monday.  2. Thrombocytopenia- platelet count 2 today with active spontaneous bleeding.  Due to history of antibodies, unable to transfuse platelets today.  Orders for platelets placed and she will return to clinic tomorrow for transfusion of 2 units of platelets.   3. Epistaxis -spontaneous nosebleed uncontrolled by afrin x 3 and pressure. Called Dr. Pryor Ochoa, ENT who agrees to see patient in clinic to pack nose to help stop bleeding.    Patient wishes to avoid hospitalization and ER. Risks of bleeding including spontaneous bleeding discussed including need for safety precautions. She verbalizes understanding and recognizes risk. Findings also discussed with Dr. Rogue Bussing who contributed and agrees with management plan.   rtc on 09/06/17 for platelet transfusion, bone marrow on  09/09/17. Rtc to follow-up with Dr. Rogue Bussing as scheduled.   Visit Diagnosis 1. MDS (myelodysplastic syndrome) ([place])   2. Thrombocytopenia ([place])   3. Left-sided epistaxis     Patient expressed understanding and was in agreement with this plan. She also understands that She can call clinic at any time with any questions, concerns, or complaints.    Beckey Rutter, DNP, AGNP-C [place] at [facility] [phone] (work cell) [phone] (office) 09/05/17 4:32 PM

## 2017-09-06 ENCOUNTER — Inpatient Hospital Stay: Payer: Medicare Other

## 2017-09-06 ENCOUNTER — Other Ambulatory Visit: Payer: Self-pay | Admitting: Radiology

## 2017-09-06 VITALS — BP 124/72 | HR 93 | Temp 96.0°F | Resp 20

## 2017-09-06 DIAGNOSIS — D696 Thrombocytopenia, unspecified: Secondary | ICD-10-CM

## 2017-09-06 DIAGNOSIS — D693 Immune thrombocytopenic purpura: Secondary | ICD-10-CM

## 2017-09-06 DIAGNOSIS — D709 Neutropenia, unspecified: Secondary | ICD-10-CM | POA: Diagnosis not present

## 2017-09-06 DIAGNOSIS — C50811 Malignant neoplasm of overlapping sites of right female breast: Secondary | ICD-10-CM | POA: Diagnosis not present

## 2017-09-06 DIAGNOSIS — Z170 Estrogen receptor positive status [ER+]: Secondary | ICD-10-CM

## 2017-09-06 DIAGNOSIS — R040 Epistaxis: Secondary | ICD-10-CM | POA: Diagnosis not present

## 2017-09-06 DIAGNOSIS — R609 Edema, unspecified: Secondary | ICD-10-CM | POA: Diagnosis not present

## 2017-09-06 DIAGNOSIS — Z7689 Persons encountering health services in other specified circumstances: Secondary | ICD-10-CM | POA: Diagnosis not present

## 2017-09-06 DIAGNOSIS — D469 Myelodysplastic syndrome, unspecified: Secondary | ICD-10-CM | POA: Diagnosis not present

## 2017-09-06 MED ORDER — SODIUM CHLORIDE 0.9 % IV SOLN
250.0000 mL | Freq: Once | INTRAVENOUS | Status: AC
  Administered 2017-09-06: 250 mL via INTRAVENOUS
  Filled 2017-09-06: qty 250

## 2017-09-06 MED ORDER — TBO-FILGRASTIM 480 MCG/0.8ML ~~LOC~~ SOSY
480.0000 ug | PREFILLED_SYRINGE | Freq: Once | SUBCUTANEOUS | Status: AC
  Administered 2017-09-06: 480 ug via SUBCUTANEOUS

## 2017-09-06 MED ORDER — HEPARIN SOD (PORK) LOCK FLUSH 100 UNIT/ML IV SOLN
500.0000 [IU] | Freq: Every day | INTRAVENOUS | Status: AC | PRN
  Administered 2017-09-06: 500 [IU]
  Filled 2017-09-06 (×2): qty 5

## 2017-09-06 MED ORDER — DIPHENHYDRAMINE HCL 25 MG PO CAPS
25.0000 mg | ORAL_CAPSULE | Freq: Once | ORAL | Status: AC
  Administered 2017-09-06: 25 mg via ORAL
  Filled 2017-09-06: qty 1

## 2017-09-06 MED ORDER — METHYLPREDNISOLONE SODIUM SUCC 125 MG IJ SOLR
40.0000 mg | Freq: Once | INTRAMUSCULAR | Status: AC
  Administered 2017-09-06: 40 mg via INTRAVENOUS
  Filled 2017-09-06: qty 2

## 2017-09-06 MED ORDER — SODIUM CHLORIDE 0.9% FLUSH
10.0000 mL | INTRAVENOUS | Status: DC | PRN
  Filled 2017-09-06: qty 10

## 2017-09-09 ENCOUNTER — Inpatient Hospital Stay: Payer: Medicare Other | Attending: Internal Medicine

## 2017-09-09 ENCOUNTER — Other Ambulatory Visit: Payer: Self-pay | Admitting: *Deleted

## 2017-09-09 ENCOUNTER — Ambulatory Visit: Payer: Medicare Other | Admitting: Internal Medicine

## 2017-09-09 ENCOUNTER — Inpatient Hospital Stay: Payer: Medicare Other

## 2017-09-09 ENCOUNTER — Other Ambulatory Visit (HOSPITAL_COMMUNITY)
Admission: RE | Admit: 2017-09-09 | Disposition: A | Discharge status: Home / Self Care | Payer: Medicare Other | Source: Ambulatory Visit | Attending: Internal Medicine | Admitting: Internal Medicine

## 2017-09-09 ENCOUNTER — Ambulatory Visit
Admission: RE | Admit: 2017-09-09 | Discharge: 2017-09-09 | Disposition: A | Discharge status: Home / Self Care | Payer: Medicare Other | Source: Ambulatory Visit | Attending: Internal Medicine | Admitting: Internal Medicine

## 2017-09-09 DIAGNOSIS — Z923 Personal history of irradiation: Secondary | ICD-10-CM | POA: Insufficient documentation

## 2017-09-09 DIAGNOSIS — N920 Excessive and frequent menstruation with regular cycle: Secondary | ICD-10-CM | POA: Insufficient documentation

## 2017-09-09 DIAGNOSIS — D61818 Other pancytopenia: Secondary | ICD-10-CM | POA: Diagnosis not present

## 2017-09-09 DIAGNOSIS — Z8679 Personal history of other diseases of the circulatory system: Secondary | ICD-10-CM | POA: Insufficient documentation

## 2017-09-09 DIAGNOSIS — J449 Chronic obstructive pulmonary disease, unspecified: Secondary | ICD-10-CM | POA: Diagnosis not present

## 2017-09-09 DIAGNOSIS — M1611 Unilateral primary osteoarthritis, right hip: Secondary | ICD-10-CM | POA: Insufficient documentation

## 2017-09-09 DIAGNOSIS — I10 Essential (primary) hypertension: Secondary | ICD-10-CM | POA: Insufficient documentation

## 2017-09-09 DIAGNOSIS — F17210 Nicotine dependence, cigarettes, uncomplicated: Secondary | ICD-10-CM | POA: Insufficient documentation

## 2017-09-09 DIAGNOSIS — R630 Anorexia: Secondary | ICD-10-CM | POA: Diagnosis not present

## 2017-09-09 DIAGNOSIS — Z9221 Personal history of antineoplastic chemotherapy: Secondary | ICD-10-CM | POA: Insufficient documentation

## 2017-09-09 DIAGNOSIS — C50811 Malignant neoplasm of overlapping sites of right female breast: Secondary | ICD-10-CM | POA: Insufficient documentation

## 2017-09-09 DIAGNOSIS — E785 Hyperlipidemia, unspecified: Secondary | ICD-10-CM | POA: Insufficient documentation

## 2017-09-09 DIAGNOSIS — D6949 Other primary thrombocytopenia: Secondary | ICD-10-CM | POA: Diagnosis not present

## 2017-09-09 DIAGNOSIS — Z79899 Other long term (current) drug therapy: Secondary | ICD-10-CM | POA: Diagnosis not present

## 2017-09-09 DIAGNOSIS — Z79811 Long term (current) use of aromatase inhibitors: Secondary | ICD-10-CM | POA: Insufficient documentation

## 2017-09-09 DIAGNOSIS — Z888 Allergy status to other drugs, medicaments and biological substances status: Secondary | ICD-10-CM | POA: Insufficient documentation

## 2017-09-09 DIAGNOSIS — D469 Myelodysplastic syndrome, unspecified: Secondary | ICD-10-CM | POA: Insufficient documentation

## 2017-09-09 DIAGNOSIS — Z170 Estrogen receptor positive status [ER+]: Secondary | ICD-10-CM

## 2017-09-09 DIAGNOSIS — Z85118 Personal history of other malignant neoplasm of bronchus and lung: Secondary | ICD-10-CM | POA: Insufficient documentation

## 2017-09-09 DIAGNOSIS — Z9889 Other specified postprocedural states: Secondary | ICD-10-CM | POA: Diagnosis not present

## 2017-09-09 DIAGNOSIS — Z853 Personal history of malignant neoplasm of breast: Secondary | ICD-10-CM | POA: Diagnosis not present

## 2017-09-09 DIAGNOSIS — M1990 Unspecified osteoarthritis, unspecified site: Secondary | ICD-10-CM | POA: Diagnosis not present

## 2017-09-09 DIAGNOSIS — D693 Immune thrombocytopenic purpura: Secondary | ICD-10-CM

## 2017-09-09 DIAGNOSIS — M810 Age-related osteoporosis without current pathological fracture: Secondary | ICD-10-CM | POA: Insufficient documentation

## 2017-09-09 DIAGNOSIS — Z803 Family history of malignant neoplasm of breast: Secondary | ICD-10-CM | POA: Insufficient documentation

## 2017-09-09 DIAGNOSIS — D649 Anemia, unspecified: Secondary | ICD-10-CM

## 2017-09-09 DIAGNOSIS — Z885 Allergy status to narcotic agent status: Secondary | ICD-10-CM | POA: Diagnosis not present

## 2017-09-09 DIAGNOSIS — K219 Gastro-esophageal reflux disease without esophagitis: Secondary | ICD-10-CM | POA: Insufficient documentation

## 2017-09-09 DIAGNOSIS — Z800 Family history of malignant neoplasm of digestive organs: Secondary | ICD-10-CM | POA: Diagnosis not present

## 2017-09-09 DIAGNOSIS — R5383 Other fatigue: Secondary | ICD-10-CM | POA: Diagnosis not present

## 2017-09-09 LAB — CBC WITH DIFFERENTIAL/PLATELET
BAND NEUTROPHILS: 7 %
BASOS ABS: 0 10*3/uL (ref 0–0.1)
Basophils Relative: 0 %
Blasts: 0 %
EOS ABS: 0 10*3/uL (ref 0–0.7)
Eosinophils Relative: 0 %
HCT: 23 % — ABNORMAL LOW (ref 35.0–47.0)
Hemoglobin: 7.9 g/dL — ABNORMAL LOW (ref 12.0–16.0)
Lymphocytes Relative: 55 %
Lymphs Abs: 1.8 10*3/uL (ref 1.0–3.6)
MCH: 30.8 pg (ref 26.0–34.0)
MCHC: 34.6 g/dL (ref 32.0–36.0)
MCV: 89 fL (ref 80.0–100.0)
METAMYELOCYTES PCT: 2 %
MYELOCYTES: 0 %
Monocytes Absolute: 0.1 10*3/uL — ABNORMAL LOW (ref 0.2–0.9)
Monocytes Relative: 2 %
NEUTROS ABS: 1.5 10*3/uL (ref 1.4–6.5)
Neutrophils Relative %: 34 %
Other: 0 %
PROMYELOCYTES RELATIVE: 0 %
Platelets: 44 10*3/uL — ABNORMAL LOW (ref 150–440)
RBC: 2.58 MIL/uL — AB (ref 3.80–5.20)
RDW: 13 % (ref 11.5–14.5)
WBC: 3.4 10*3/uL — ABNORMAL LOW (ref 3.6–11.0)
nRBC: 0 /100 WBC

## 2017-09-09 LAB — BPAM PLATELET PHERESIS
BLOOD PRODUCT EXPIRATION DATE: 201906281415
Blood Product Expiration Date: 201906281415
ISSUE DATE / TIME: 201906281415
ISSUE DATE / TIME: 201906281415
UNIT TYPE AND RH: 600
Unit Type and Rh: 600

## 2017-09-09 LAB — PREPARE PLATELET PHERESIS
UNIT DIVISION: 0
Unit division: 0

## 2017-09-09 LAB — PROTIME-INR
INR: 1.02
Prothrombin Time: 13.3 seconds (ref 11.4–15.2)

## 2017-09-09 LAB — PREPARE RBC (CROSSMATCH)

## 2017-09-09 MED ORDER — HEPARIN SOD (PORK) LOCK FLUSH 100 UNIT/ML IV SOLN
INTRAVENOUS | Status: AC
  Filled 2017-09-09: qty 5

## 2017-09-09 MED ORDER — FENTANYL CITRATE (PF) 100 MCG/2ML IJ SOLN
INTRAMUSCULAR | Status: AC
  Filled 2017-09-09: qty 4

## 2017-09-09 MED ORDER — SODIUM CHLORIDE 0.9 % IV SOLN
INTRAVENOUS | Status: DC
  Administered 2017-09-09: 09:00:00 via INTRAVENOUS

## 2017-09-09 MED ORDER — LIDOCAINE HCL (PF) 1 % IJ SOLN
INTRAMUSCULAR | Status: AC | PRN
  Administered 2017-09-09: 10 mL

## 2017-09-09 MED ORDER — MIDAZOLAM HCL 5 MG/5ML IJ SOLN
INTRAMUSCULAR | Status: AC | PRN
  Administered 2017-09-09 (×2): 1 mg via INTRAVENOUS

## 2017-09-09 MED ORDER — MIDAZOLAM HCL 5 MG/5ML IJ SOLN
INTRAMUSCULAR | Status: AC
  Filled 2017-09-09: qty 5

## 2017-09-09 MED ORDER — FENTANYL CITRATE (PF) 100 MCG/2ML IJ SOLN
INTRAMUSCULAR | Status: AC | PRN
  Administered 2017-09-09: 50 ug via INTRAVENOUS
  Administered 2017-09-09: 25 ug via INTRAVENOUS

## 2017-09-09 MED ORDER — TBO-FILGRASTIM 480 MCG/0.8ML ~~LOC~~ SOSY
480.0000 ug | PREFILLED_SYRINGE | Freq: Once | SUBCUTANEOUS | Status: AC
  Administered 2017-09-09: 480 ug via SUBCUTANEOUS

## 2017-09-09 NOTE — H&P (Signed)
Chief Complaint: Patient was seen in consultation today for No chief complaint on file.  at the request of Brahmanday,Govinda R  Referring Physician(s): [name]  Supervising Physician: Marybelle Killings  Patient Status: [facility] - Out-pt  History of Present Illness: Shelly Arias is a 72 y.o. female with metastatic breast cancer and unexplained pancytopenia. She has had BM Bx's before and was diagnosed with MDS by exclusion. Her recent PLT were less than 5k. She received 2u PLTs three days ago. Otherwise no complaints.  Past Medical History:  Diagnosis Date  . Blood dyscrasia   . Breast cancer ([place])    right, lumpectomy, radiation, chemo  . Breast cancer ([place])    Right, 2007  . Breast cancer ([place]) 06/20/2016   INVASIVE DUCTAL CARCINOMA.  . Collapsed lung 02/14/2017   RIGHT  . Complication of anesthesia    PT STATED AFTER BIL HIP SURGERIES SHE STOPPED BREATHING THE NIGHT AFTER SURGERY  . COPD (chronic obstructive pulmonary disease) ([place])   . GERD (gastroesophageal reflux disease)   . Headache   . History of chemotherapy   . History of methicillin resistant staphylococcus aureus (MRSA) 2011  . History of radiation therapy   . Hyperlipidemia   . Hypertension   . Liver cancer ([place]) 05/2016  . Lung cancer ([place]) 05/2016  . Osteoarthritis    right hip  . Osteoporosis   . 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 ABDOMINAL HYSTERECTOMY    . BREAST BIOPSY Left 2002   left breast, calcifications  . BREAST BIOPSY Left 06/20/2016   INVASIVE DUCTAL CARCINOMA.  Marland Kitchen BREAST EXCISIONAL BIOPSY Right 2007   positive  . bunion repair    . ESOPHAGOGASTRODUODENOSCOPY (EGD) WITH PROPOFOL N/A 08/05/2016   Procedure: ESOPHAGOGASTRODUODENOSCOPY (EGD) WITH PROPOFOL;  Surgeon: San Jetty, MD;  Location: [facility] ENDOSCOPY;  Service: General;  Laterality: N/A;  . IR FLUORO GUIDE CV LINE RIGHT  07/25/2017  .  JOINT REPLACEMENT    . left breast biopsy    . NASAL SINUS SURGERY    . PORTACATH PLACEMENT Right 04/13/2017   Procedure: INSERTION PORT-A-CATH- RIGHT INTERNAL JUGULAR;  Surgeon: Robert Bellow, MD;  Location: [facility] ORS;  Service: General;  Laterality: Right;  . right hip replacement    . SHOULDER SURGERY    . SQUAMOUS CELL CARCINOMA EXCISION     right leg, Dr. Nicole Kindred  . TOTAL HIP ARTHROPLASTY     right    Allergies: Codeine; Fish-derived products; Morphine sulfate; Adhesive [tape]; and Oxycodone  Medications: Prior to Admission medications   Medication Sig Start Date End Date Taking? Authorizing Provider  acyclovir (ZOVIRAX) 400 MG tablet Take 1 tablet (400 mg total) by mouth 2 (two) times daily. 06/24/17  Yes [name], MD  ALPRAZolam Duanne Moron) 0.5 MG tablet Take 1 tablet (0.5 mg total) by mouth every 8 (eight) hours as needed for anxiety. 07/30/17  Yes [name], MD  cyanocobalamin (,VITAMIN B-12,) 1000 MCG/ML injection Inject 1,000 mcg into the muscle every 30 (thirty) days.   Yes [provider]  fexofenadine (ALLEGRA) 180 MG tablet Take 180 mg by mouth every morning.    Yes [provider]  fluconazole (DIFLUCAN) 200 MG tablet Take 1 tablet (200 mg total) by mouth daily. 06/24/17  Yes [name], MD  Glucosamine-Chondroitin (COSAMIN DS PO) Take 1 tablet by mouth daily. In the morning.   Yes [provider]  letrozole (FEMARA) 2.5 MG tablet Take 1 tablet (2.5 mg total) by mouth daily. Once a day. 06/20/17  Yes [name], MD  levofloxacin (LEVAQUIN) 500 MG tablet Take 500 mg by mouth daily.   Yes [provider]  metoprolol succinate (TOPROL-XL) 25 MG 24 hr tablet Take 1 tablet (25 mg total) by mouth daily. 05/02/17  Yes [name], MD  pravastatin (PRAVACHOL) 40 MG tablet Take 1 tablet (40 mg total) by mouth daily. 05/02/17  Yes [name], MD  Probiotic Product (PROBIOTIC & ACIDOPHILUS EX  ST PO) Take 1 tablet by mouth daily.    Yes [provider]  ranitidine (ZANTAC) 75 MG tablet Take 75 mg by mouth daily as needed for heartburn.    Yes [provider]  Vitamin D, Ergocalciferol, (DRISDOL) 50000 units CAPS capsule TAKE 1 CAPSULE (50,000 UNITS TOTAL) BY MOUTH EVERY SATURDAY. IN THE MORNING. 08/12/17  Yes [name], MD  prochlorperazine (COMPAZINE) 10 MG tablet Take 1 tablet (10 mg total) by mouth every 6 (six) hours as needed for nausea or vomiting. 07/15/17   Verlon Au, NP     Family History  Problem Relation Age of Onset  . Heart disease Father 13  . Heart disease Mother   . Hyperlipidemia Mother   . Heart disease Brother   . Diabetes Brother   . Diabetes Maternal Grandmother   . Breast cancer Cousin   . Kidney disease Brother     Social History   Socioeconomic History  . Marital status: Married    Spouse name: Not on file  . Number of children: Not on file  . Years of education: Not on file  . Highest education level: Not on file  Occupational History  . Not on file  Social Needs  . Financial resource strain: Not hard at all  . Food insecurity:    Worry: Never true    Inability: Never true  . Transportation needs:    Medical: No    Non-medical: No  Tobacco Use  . Smoking status: Current Some Day Smoker    Packs/day: 0.25    Years: 30.00    Pack years: 7.50    Types: Cigarettes  . Smokeless tobacco: Never Used  Substance and Sexual Activity  . Alcohol use: Not Currently    Alcohol/week: 0.0 oz    Comment: socially  . Drug use: No  . Sexual activity: Never  Lifestyle  . Physical activity:    Days per week: 4 days    Minutes per session: 30 min  . Stress: Not at all  Relationships  . Social connections:    Talks on phone: Three times a week    Gets together: Twice a week    Attends religious service: More than 4 times per year    Active member of club or organization: No    Attends meetings of clubs or  organizations: Never    Relationship status: Married  Other Topics Concern  . Not on file  Social History Narrative  . Not on file     Review of Systems: A 12 point ROS discussed and pertinent positives are indicated in the HPI above.  All other systems are negative.  Review of Systems  Vital Signs: BP 126/66   Pulse 98   Temp 97.6 F (36.4 C) (Oral)   Resp 16   Ht 5' 1"  (1.549 m)   SpO2 95%   BMI 24.00 kg/m  Physical Exam  Constitutional: She is oriented to person, place, and time. She appears well-developed and well-nourished.  HENT:  Head: Normocephalic and atraumatic.  Cardiovascular: Normal rate and regular rhythm.  Pulmonary/Chest: Effort normal and breath sounds normal.  Neurological: She is alert and oriented to person, place, and time.  Skin: Skin is warm and dry.      Imaging: No results found.  Labs:  CBC: Recent Labs    08/29/17 0958 09/02/17 1045 09/05/17 1121 09/09/17 0809  WBC 3.8 2.5* 4.5 3.4*  HGB 8.2* 7.5* 8.5* 7.9*  HCT 23.0* 21.0* 24.4* 23.0*  PLT 84* 21* <5* 44*    COAGS: Recent Labs    03/29/17 1550 04/10/17 0920 04/11/17 0941 07/21/17 1532 09/09/17 0809  INR 0.99 0.93 0.92 1.13 1.02  APTT 26  --   --   --   --     BMP: Recent Labs    07/22/17 0406 08/05/17 1052 08/06/17 1659 08/07/17 0327 08/29/17 0958  NA 131* 136  --  134* 137  K 3.9 2.8* 3.3* 3.8 3.6  CL 100* 99*  --  99* 102  CO2 24 28  --  25 25  GLUCOSE 111* 143*  --  203* 133*  BUN 12 15  --  16 20  CALCIUM 7.9* 7.8*  --  8.0* 9.2  CREATININE 0.74 0.96  --  0.69 0.99  GFRNONAA >60 59*  --  >60 57*  GFRAA >60 >60  --  >60 >60    LIVER FUNCTION TESTS: Recent Labs    06/10/17 1028 07/15/17 1221 07/21/17 1532 08/05/17 1052  BILITOT 0.7 0.7 0.5 0.6  AST 18 33 18 21  ALT 17 28 21 24   ALKPHOS 71 62 57 51  PROT 6.2* 6.4* 6.5 5.8*  ALBUMIN 3.5 2.7* 2.8* 2.4*    TUMOR MARKERS: No results for input(s): AFPTM, CEA, CA199, CHROMGRNA in the last  8760 hours.  Assessment and Plan:  MDS, for repeat BM Bx.  Thank you for this interesting consult.  I greatly enjoyed meeting Shelly Arias and look forward to participating in their care.  A copy of this report was sent to the requesting provider on this date.  Electronically Signed: Paxten Appelt, ART A, MD 09/09/2017, 8:41 AM   I spent a total of  40 Minutes   in face to face in clinical consultation, greater than 50% of which was counseling/coordinating care for bone marrow biopsy.

## 2017-09-09 NOTE — Procedures (Signed)
BM Bx and aspirate EBL 0 Comp 0

## 2017-09-10 ENCOUNTER — Inpatient Hospital Stay: Payer: Medicare Other

## 2017-09-10 VITALS — BP 125/80 | HR 99 | Temp 96.2°F | Resp 18

## 2017-09-10 DIAGNOSIS — D693 Immune thrombocytopenic purpura: Secondary | ICD-10-CM

## 2017-09-10 DIAGNOSIS — R040 Epistaxis: Secondary | ICD-10-CM | POA: Diagnosis not present

## 2017-09-10 DIAGNOSIS — D469 Myelodysplastic syndrome, unspecified: Secondary | ICD-10-CM | POA: Diagnosis not present

## 2017-09-10 DIAGNOSIS — C50811 Malignant neoplasm of overlapping sites of right female breast: Secondary | ICD-10-CM

## 2017-09-10 DIAGNOSIS — Z170 Estrogen receptor positive status [ER+]: Secondary | ICD-10-CM | POA: Diagnosis not present

## 2017-09-10 DIAGNOSIS — D649 Anemia, unspecified: Secondary | ICD-10-CM

## 2017-09-10 DIAGNOSIS — Z79811 Long term (current) use of aromatase inhibitors: Secondary | ICD-10-CM | POA: Diagnosis not present

## 2017-09-10 DIAGNOSIS — D61818 Other pancytopenia: Secondary | ICD-10-CM | POA: Diagnosis not present

## 2017-09-10 DIAGNOSIS — Z8679 Personal history of other diseases of the circulatory system: Secondary | ICD-10-CM | POA: Diagnosis not present

## 2017-09-10 MED ORDER — SODIUM CHLORIDE 0.9 % IV SOLN
250.0000 mL | Freq: Once | INTRAVENOUS | Status: AC
  Administered 2017-09-10: 250 mL via INTRAVENOUS
  Filled 2017-09-10: qty 250

## 2017-09-10 MED ORDER — TBO-FILGRASTIM 480 MCG/0.8ML ~~LOC~~ SOSY
480.0000 ug | PREFILLED_SYRINGE | Freq: Once | SUBCUTANEOUS | Status: AC
  Administered 2017-09-10: 480 ug via SUBCUTANEOUS

## 2017-09-10 MED ORDER — ACETAMINOPHEN 325 MG PO TABS
650.0000 mg | ORAL_TABLET | Freq: Once | ORAL | Status: AC
  Administered 2017-09-10: 650 mg via ORAL
  Filled 2017-09-10: qty 2

## 2017-09-10 MED ORDER — HEPARIN SOD (PORK) LOCK FLUSH 100 UNIT/ML IV SOLN
500.0000 [IU] | Freq: Every day | INTRAVENOUS | Status: AC | PRN
  Administered 2017-09-10: 500 [IU]
  Filled 2017-09-10: qty 5

## 2017-09-10 MED ORDER — SODIUM CHLORIDE 0.9% FLUSH
10.0000 mL | INTRAVENOUS | Status: DC | PRN
  Filled 2017-09-10: qty 10

## 2017-09-10 MED ORDER — DIPHENHYDRAMINE HCL 25 MG PO CAPS
25.0000 mg | ORAL_CAPSULE | Freq: Once | ORAL | Status: AC
  Administered 2017-09-10: 25 mg via ORAL
  Filled 2017-09-10: qty 1

## 2017-09-11 ENCOUNTER — Telehealth: Payer: Self-pay | Admitting: *Deleted

## 2017-09-11 ENCOUNTER — Other Ambulatory Visit: Payer: Self-pay | Admitting: *Deleted

## 2017-09-11 ENCOUNTER — Inpatient Hospital Stay: Payer: Medicare Other

## 2017-09-11 DIAGNOSIS — D469 Myelodysplastic syndrome, unspecified: Secondary | ICD-10-CM | POA: Diagnosis not present

## 2017-09-11 DIAGNOSIS — Z79811 Long term (current) use of aromatase inhibitors: Secondary | ICD-10-CM | POA: Diagnosis not present

## 2017-09-11 DIAGNOSIS — D61818 Other pancytopenia: Secondary | ICD-10-CM | POA: Diagnosis not present

## 2017-09-11 DIAGNOSIS — C50811 Malignant neoplasm of overlapping sites of right female breast: Secondary | ICD-10-CM | POA: Diagnosis not present

## 2017-09-11 DIAGNOSIS — Z170 Estrogen receptor positive status [ER+]: Secondary | ICD-10-CM | POA: Diagnosis not present

## 2017-09-11 DIAGNOSIS — Z8679 Personal history of other diseases of the circulatory system: Secondary | ICD-10-CM | POA: Diagnosis not present

## 2017-09-11 DIAGNOSIS — D696 Thrombocytopenia, unspecified: Secondary | ICD-10-CM

## 2017-09-11 LAB — CBC WITH DIFFERENTIAL/PLATELET
BASOS ABS: 0 10*3/uL (ref 0–0.1)
Basophils Relative: 0 %
EOS ABS: 0 10*3/uL (ref 0–0.7)
EOS PCT: 0 %
HEMATOCRIT: 24.5 % — AB (ref 35.0–47.0)
Hemoglobin: 8.7 g/dL — ABNORMAL LOW (ref 12.0–16.0)
Lymphocytes Relative: 28 %
Lymphs Abs: 1.4 10*3/uL (ref 1.0–3.6)
MCH: 30.5 pg (ref 26.0–34.0)
MCHC: 35.4 g/dL (ref 32.0–36.0)
MCV: 86 fL (ref 80.0–100.0)
MONO ABS: 0.3 10*3/uL (ref 0.2–0.9)
Monocytes Relative: 6 %
NEUTROS ABS: 3.3 10*3/uL (ref 1.4–6.5)
Neutrophils Relative %: 66 %
PLATELETS: 17 10*3/uL — AB (ref 150–400)
RBC: 2.85 MIL/uL — ABNORMAL LOW (ref 3.80–5.20)
RDW: 14.1 % (ref 11.5–14.5)
WBC: 5.1 10*3/uL (ref 3.6–11.0)

## 2017-09-11 LAB — TYPE AND SCREEN
ABO/RH(D): O POS
Antibody Screen: POSITIVE
DONOR AG TYPE: NEGATIVE
UNIT DIVISION: 0
Unit division: 0

## 2017-09-11 LAB — BPAM RBC
BLOOD PRODUCT EXPIRATION DATE: 201907062359
Blood Product Expiration Date: 201907212359
ISSUE DATE / TIME: 201907021355
UNIT TYPE AND RH: 5100
Unit Type and Rh: 5100

## 2017-09-11 LAB — SAMPLE TO BLOOD BANK

## 2017-09-11 NOTE — Telephone Encounter (Signed)
Shelly Arias in main lab contacted [place], RN  At 11:30 09/11/2017 with critical plt count of 17. Per md, most likely pt will need plts by Friday. md gave v/o to order 1 unit of plts for Friday 09/13/17 at 1:30 pm. Orders entered. I left vm for patient to confirm these apts with patient.

## 2017-09-13 ENCOUNTER — Inpatient Hospital Stay: Payer: Medicare Other

## 2017-09-13 DIAGNOSIS — D696 Thrombocytopenia, unspecified: Secondary | ICD-10-CM

## 2017-09-13 DIAGNOSIS — D61818 Other pancytopenia: Secondary | ICD-10-CM | POA: Diagnosis not present

## 2017-09-13 DIAGNOSIS — C50811 Malignant neoplasm of overlapping sites of right female breast: Secondary | ICD-10-CM | POA: Diagnosis not present

## 2017-09-13 DIAGNOSIS — Z79811 Long term (current) use of aromatase inhibitors: Secondary | ICD-10-CM | POA: Diagnosis not present

## 2017-09-13 DIAGNOSIS — D469 Myelodysplastic syndrome, unspecified: Secondary | ICD-10-CM | POA: Diagnosis not present

## 2017-09-13 DIAGNOSIS — Z8679 Personal history of other diseases of the circulatory system: Secondary | ICD-10-CM | POA: Diagnosis not present

## 2017-09-13 DIAGNOSIS — Z170 Estrogen receptor positive status [ER+]: Secondary | ICD-10-CM | POA: Diagnosis not present

## 2017-09-13 MED ORDER — ACETAMINOPHEN 325 MG PO TABS
650.0000 mg | ORAL_TABLET | Freq: Once | ORAL | Status: AC
  Administered 2017-09-13: 650 mg via ORAL
  Filled 2017-09-13: qty 2

## 2017-09-13 MED ORDER — SODIUM CHLORIDE 0.9 % IV SOLN
250.0000 mL | Freq: Once | INTRAVENOUS | Status: AC
  Administered 2017-09-13: 250 mL via INTRAVENOUS
  Filled 2017-09-13: qty 250

## 2017-09-13 MED ORDER — METHYLPREDNISOLONE SODIUM SUCC 125 MG IJ SOLR
40.0000 mg | Freq: Once | INTRAMUSCULAR | Status: AC
  Administered 2017-09-13: 40 mg via INTRAVENOUS
  Filled 2017-09-13: qty 2

## 2017-09-13 MED ORDER — SODIUM CHLORIDE 0.9% FLUSH
10.0000 mL | INTRAVENOUS | Status: DC | PRN
  Administered 2017-09-13: 10 mL via INTRAVENOUS
  Filled 2017-09-13: qty 10

## 2017-09-13 MED ORDER — HEPARIN SOD (PORK) LOCK FLUSH 100 UNIT/ML IV SOLN
500.0000 [IU] | Freq: Once | INTRAVENOUS | Status: AC
  Administered 2017-09-13: 500 [IU] via INTRAVENOUS
  Filled 2017-09-13: qty 5

## 2017-09-13 MED ORDER — DIPHENHYDRAMINE HCL 25 MG PO CAPS
25.0000 mg | ORAL_CAPSULE | Freq: Once | ORAL | Status: AC
  Administered 2017-09-13: 25 mg via ORAL
  Filled 2017-09-13: qty 1

## 2017-09-14 ENCOUNTER — Encounter: Payer: Self-pay | Admitting: Hematology and Oncology

## 2017-09-14 LAB — PREPARE PLATELET PHERESIS: Unit division: 0

## 2017-09-14 LAB — BPAM PLATELET PHERESIS
BLOOD PRODUCT EXPIRATION DATE: 201907051430
ISSUE DATE / TIME: 201907051407
Unit Type and Rh: 5100

## 2017-09-16 ENCOUNTER — Other Ambulatory Visit: Payer: Self-pay

## 2017-09-16 ENCOUNTER — Inpatient Hospital Stay: Payer: Medicare Other

## 2017-09-16 ENCOUNTER — Encounter: Payer: Self-pay | Admitting: Internal Medicine

## 2017-09-16 ENCOUNTER — Inpatient Hospital Stay (HOSPITAL_BASED_OUTPATIENT_CLINIC_OR_DEPARTMENT_OTHER): Payer: Medicare Other | Admitting: Internal Medicine

## 2017-09-16 VITALS — BP 103/68 | HR 116 | Temp 98.6°F | Resp 20 | Ht 61.0 in | Wt 125.4 lb

## 2017-09-16 DIAGNOSIS — R5383 Other fatigue: Secondary | ICD-10-CM | POA: Diagnosis not present

## 2017-09-16 DIAGNOSIS — D469 Myelodysplastic syndrome, unspecified: Secondary | ICD-10-CM

## 2017-09-16 DIAGNOSIS — Z800 Family history of malignant neoplasm of digestive organs: Secondary | ICD-10-CM

## 2017-09-16 DIAGNOSIS — J449 Chronic obstructive pulmonary disease, unspecified: Secondary | ICD-10-CM

## 2017-09-16 DIAGNOSIS — C50811 Malignant neoplasm of overlapping sites of right female breast: Secondary | ICD-10-CM

## 2017-09-16 DIAGNOSIS — Z9221 Personal history of antineoplastic chemotherapy: Secondary | ICD-10-CM

## 2017-09-16 DIAGNOSIS — D61818 Other pancytopenia: Secondary | ICD-10-CM

## 2017-09-16 DIAGNOSIS — Z170 Estrogen receptor positive status [ER+]: Secondary | ICD-10-CM

## 2017-09-16 DIAGNOSIS — E785 Hyperlipidemia, unspecified: Secondary | ICD-10-CM

## 2017-09-16 DIAGNOSIS — Z803 Family history of malignant neoplasm of breast: Secondary | ICD-10-CM

## 2017-09-16 DIAGNOSIS — Z8679 Personal history of other diseases of the circulatory system: Secondary | ICD-10-CM | POA: Diagnosis not present

## 2017-09-16 DIAGNOSIS — I10 Essential (primary) hypertension: Secondary | ICD-10-CM | POA: Diagnosis not present

## 2017-09-16 DIAGNOSIS — F17210 Nicotine dependence, cigarettes, uncomplicated: Secondary | ICD-10-CM

## 2017-09-16 DIAGNOSIS — K219 Gastro-esophageal reflux disease without esophagitis: Secondary | ICD-10-CM

## 2017-09-16 DIAGNOSIS — Z79811 Long term (current) use of aromatase inhibitors: Secondary | ICD-10-CM

## 2017-09-16 DIAGNOSIS — Z923 Personal history of irradiation: Secondary | ICD-10-CM

## 2017-09-16 DIAGNOSIS — M810 Age-related osteoporosis without current pathological fracture: Secondary | ICD-10-CM

## 2017-09-16 DIAGNOSIS — R630 Anorexia: Secondary | ICD-10-CM | POA: Diagnosis not present

## 2017-09-16 DIAGNOSIS — M1990 Unspecified osteoarthritis, unspecified site: Secondary | ICD-10-CM

## 2017-09-16 DIAGNOSIS — Z79899 Other long term (current) drug therapy: Secondary | ICD-10-CM

## 2017-09-16 DIAGNOSIS — N920 Excessive and frequent menstruation with regular cycle: Secondary | ICD-10-CM

## 2017-09-16 LAB — CBC WITH DIFFERENTIAL/PLATELET
BASOS ABS: 0 10*3/uL (ref 0–0.1)
Basophils Relative: 1 %
EOS PCT: 0 %
Eosinophils Absolute: 0 10*3/uL (ref 0–0.7)
HEMATOCRIT: 24.4 % — AB (ref 35.0–47.0)
Hemoglobin: 8.7 g/dL — ABNORMAL LOW (ref 12.0–16.0)
LYMPHS PCT: 41 %
Lymphs Abs: 1.1 10*3/uL (ref 1.0–3.6)
MCH: 30.9 pg (ref 26.0–34.0)
MCHC: 35.7 g/dL (ref 32.0–36.0)
MCV: 86.6 fL (ref 80.0–100.0)
MONO ABS: 0.2 10*3/uL (ref 0.2–0.9)
MONOS PCT: 7 %
NEUTROS ABS: 1.4 10*3/uL (ref 1.4–6.5)
Neutrophils Relative %: 51 %
PLATELETS: 44 10*3/uL — AB (ref 150–440)
RBC: 2.82 MIL/uL — ABNORMAL LOW (ref 3.80–5.20)
RDW: 13.5 % (ref 11.5–14.5)
WBC: 2.7 10*3/uL — ABNORMAL LOW (ref 3.6–11.0)

## 2017-09-16 LAB — SAMPLE TO BLOOD BANK

## 2017-09-16 NOTE — Assessment & Plan Note (Addendum)
#  Highly suspicious for MDS-high-grade vs "bone marrow failure"-of unclear etiology currently status post Dacogen [April 1st 2019].  Currently on further Dacogen hold because of worsening anemia thrombocytopenia neutropenia.  #Repeat bone marrow biopsy [July 2019]-shows variable cellularity between 10 to 70%; with scant erythroid and megakaryocytic precursors; left shifted myeloid lineage [likely from growth factor support]; no evidence of any blasts-no conclusive diagnosis noted.  No evidence of metastatic breast cancer noted. Discussed with Dr.Smir.   #I had a long discussion with the patient family regarding-inconclusive nature of the bone marrow biopsy.  Discussed this options include #continued best supportive care with-transfusions; growth factor support. #Rechallenge with Dacogen-patient family concerned #given the possibility of immune mediated bone marrow suppression-challenging with immunosuppressive therapy.  A biopsy discussed with Dr. Royce Macadamia at [facility].    #Also discussed regarding evaluation at [facility] clinic.  Patient interested but not too keen.  Will initiate referral to [facility].  #Severe anemia/severe thrombocytopenia/severe neutropenia-continue supportive therapy with PRBC once a week; crossmatch platelets 1-2 times a week; Granix as needed.  Today hemoglobin is 8.4; platelets 40s; ANC- 1.4.  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 On Femara.;  PET scan April 2019-stable disease no evidence of any active breast cancer.  # vaginal bleeding-likely secondary to vaginal atrophy/low platelets.- improved.  # Monday/Thursday- labs; Friday transfusion.  Patient to follow-up with me on 18th  #Patient planning vacation starting 27th July- aug 3rd; we will plan transfusion/around patient's vacation.  # 40 minutes face-to-face with the patient discussing the above plan of care; more than 50% of time spent on prognosis/  natural history; counseling and coordination.

## 2017-09-16 NOTE — Progress Notes (Signed)
[place] OFFICE PROGRESS NOTE  Patient Care Team: [name], MD as PCP - General (Family Medicine) [name], DPM as Consulting Physician (Podiatry) [name], MD as Medical Oncologist (Hematology and Oncology) Bary Castilla Forest Gleason, MD (General Surgery)  Cancer Staging No matching staging information was found for the patient.   Oncology History   # April 2018- Left chest wall Bx- [facility]; ER-PR-POS; her 2 Neu NEG; PET- mild hilar/distal adenopathy; ~1 cm right lung nodule/ several sub-centimeter.   # May 2018- cont [facility]; Abemacliclib [on HOLD sec to cytopenia]; MARCH 2019- PET near CR from breast cancer  # April 2017-isolated Thrombocytopenia- platelets- 113; worsening PANCYTOPENIA- April 2018- BMBx- no evidence of malignancy; MDS/Acute leuk; Karyotype/MDS- FISH panel-NEG [d/w Dr.Smir];   # June 2018- REPEAT BMBx/[facility] [June 2018-Dr.Foster; Aug 2018- Dr.Powell at [facility]]; No Diagnosis.   # April 11, 2017 [third bone marrow]-is still inconclusive; no obvious evidence of acute leukemia or obvious MDS except for mild dysplastic changes; and significantly reduced megakaryocytes. 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 [reviwed at [facility]; [facility]-NEG]  # April 1st 2019- Dacogen #1- severe pancytopenia  # July 2019-[ 4th BMBx-] shows variable cellularity between 10 to 70%; with scant erythroid and megakaryocytic precursors; left shifted myeloid lineage [likely from growth factor support]; no evidence of any blasts-no conclusive diagnosis noted.  No evidence of metastatic breast cancer noted.  -------------------------------------     # May 29th 2018- N-plate- suboptimal improvement; NOV 1st week start promacta 50 mg/day; suboptimal response; DEC 1st- start promacta 75 mg/day  # DEC 18th-INCREASE PROMACTA to 115m/day  # 2006- RIGHT BREAST CA [pT1cN0M0; STAGE  I; ER/PRPos; Her 2 Neu-NEG] s/p FEC x 6 [NSABP B-36]; Femara [Dec 2007-2012]  # Osteoporosis s/p Reclast; BMD- 2015-wnl- ca+vit D   Dr.Stewart [derm ? Squamous cell s/p freeze-oct 2018] --------------------------------------------------------------  DIAGNOSIS: [[name] ] # ? MDS /Meta.Breast cancer- ER/PR pos; her-NEG  STAGE:  IV  ;GOALS: palliative  CURRENT/MOST RECENT THERAPY: dacogen [April 2019]; Letrozole [May 2017];   # Pt needs PLATELETS- WASHED/ CROSS-MATCHED PLATELETS        Carcinoma of overlapping sites of right breast in female, estrogen receptor positive ([place]    MDS (myelodysplastic syndrome) ([place]      INTERVAL HISTORY:  [name]89y.[id number].  female pleasant patient above history of " bone marrow failure" of unclear etiology; history of metastatic breast cancer ER PR positive HER-2/neu negative on letrozole is here for follow-up/review the results of the bone marrow biopsy.  Patient continues to feel fatigue.  Easy bruising.  Continues to have minimal vaginal bleeding.  No fever no chills.  Poor appetite.  No nausea no vomiting.  Positive weight loss.  Overall feels poorly.  Review of Systems  Constitutional: Positive for malaise/fatigue and weight loss. Negative for chills, diaphoresis and fever.  HENT: Negative for nosebleeds and sore throat.   Eyes: Negative for double vision.  Respiratory: Positive for shortness of breath. Negative for cough, hemoptysis, sputum production and wheezing.   Cardiovascular: Negative for chest pain, palpitations, orthopnea and leg swelling.  Gastrointestinal: Negative for abdominal pain, blood in stool, constipation, diarrhea, heartburn, melena, nausea and vomiting.  Genitourinary: Negative for dysuria, frequency and urgency.  Musculoskeletal: Negative for back pain and joint pain.  Skin: Negative.  Negative for itching and rash.  Neurological: Negative for dizziness, tingling, focal weakness, weakness and headaches.   Endo/Heme/Allergies: Does not bruise/bleed  easily.  Psychiatric/Behavioral: Negative for depression. The patient is not nervous/anxious and does not have insomnia.       PAST MEDICAL HISTORY :  Past Medical History:  Diagnosis Date  . Blood dyscrasia   . Breast cancer ([place])    right, lumpectomy, radiation, chemo  . Breast cancer ([place])    Right, 2007  . Breast cancer ([place]) 06/20/2016   INVASIVE DUCTAL CARCINOMA.  . Collapsed lung 02/14/2017   RIGHT  . Complication of anesthesia    PT STATED AFTER BIL HIP SURGERIES SHE STOPPED BREATHING THE NIGHT AFTER SURGERY  . COPD (chronic obstructive pulmonary disease) ([place])   . GERD (gastroesophageal reflux disease)   . Headache   . History of chemotherapy   . History of methicillin resistant staphylococcus aureus (MRSA) 2011  . History of radiation therapy   . Hyperlipidemia   . Hypertension   . Liver cancer ([place]) 05/2016  . Lung cancer ([place]) 05/2016  . Osteoarthritis    right hip  . Osteoporosis   . Squamous cell carcinoma    leg, Followed by Dr. Nicole Kindred    PAST SURGICAL HISTORY :   Past Surgical History:  Procedure Laterality Date  . ABDOMINAL HYSTERECTOMY    . BREAST BIOPSY Left 2002   left breast, calcifications  . BREAST BIOPSY Left 06/20/2016   INVASIVE DUCTAL CARCINOMA.  Marland Kitchen BREAST EXCISIONAL BIOPSY Right 2007   positive  . bunion repair    . ESOPHAGOGASTRODUODENOSCOPY (EGD) WITH PROPOFOL N/A 08/05/2016   Procedure: ESOPHAGOGASTRODUODENOSCOPY (EGD) WITH PROPOFOL;  Surgeon: San Jetty, MD;  Location: [facility] ENDOSCOPY;  Service: General;  Laterality: N/A;  . IR FLUORO GUIDE CV LINE RIGHT  07/25/2017  . JOINT REPLACEMENT    . left breast biopsy    . NASAL SINUS SURGERY    . PORTACATH PLACEMENT Right 04/13/2017   Procedure: INSERTION PORT-A-CATH- RIGHT INTERNAL JUGULAR;  Surgeon: Robert Bellow, MD;  Location: [facility] ORS;  Service: General;  Laterality: Right;  . right hip replacement    . SHOULDER SURGERY    . SQUAMOUS  CELL CARCINOMA EXCISION     right leg, Dr. Nicole Kindred  . TOTAL HIP ARTHROPLASTY     right    FAMILY HISTORY :   Family History  Problem Relation Age of Onset  . Heart disease Father 10  . Heart disease Mother   . Hyperlipidemia Mother   . Heart disease Brother   . Diabetes Brother   . Pancreatic cancer Brother   . Diabetes Maternal Grandmother   . Breast cancer Cousin   . Kidney disease Brother     SOCIAL HISTORY:   Social History   Tobacco Use  . Smoking status: Current Some Day Smoker    Packs/day: 0.25    Years: 30.00    Pack years: 7.50    Types: Cigarettes  . Smokeless tobacco: Never Used  Substance Use Topics  . Alcohol use: Not Currently    Alcohol/week: 0.0 oz    Comment: socially  . Drug use: No    ALLERGIES:  is allergic to codeine; fish-derived products; morphine sulfate; adhesive [tape]; and oxycodone.  MEDICATIONS:  Current Outpatient Medications  Medication Sig Dispense Refill  . acyclovir (ZOVIRAX) 400 MG tablet Take 1 tablet (400 mg total) by mouth 2 (two) times daily. 60 tablet 3  . ALPRAZolam (XANAX) 0.5 MG tablet Take 1 tablet (0.5 mg total) by mouth every 8 (eight) hours as needed for anxiety. 30 tablet 2  . cyanocobalamin (,VITAMIN B-12,) 1000  MCG/ML injection Inject 1,000 mcg into the muscle every 30 (thirty) days.    . fexofenadine (ALLEGRA) 180 MG tablet Take 180 mg by mouth every morning.     . fluconazole (DIFLUCAN) 200 MG tablet Take 1 tablet (200 mg total) by mouth daily. 30 tablet 3  . Glucosamine-Chondroitin (COSAMIN DS PO) Take 1 tablet by mouth daily. In the morning.    Marland Kitchen letrozole (FEMARA) 2.5 MG tablet Take 1 tablet (2.5 mg total) by mouth daily. Once a day. 90 tablet 0  . levofloxacin (LEVAQUIN) 500 MG tablet Take 500 mg by mouth daily.    . metoprolol succinate (TOPROL-XL) 25 MG 24 hr tablet Take 1 tablet (25 mg total) by mouth daily. 90 tablet 3  . pravastatin (PRAVACHOL) 40 MG tablet Take 1 tablet (40 mg total) by mouth daily. 90  tablet 3  . Probiotic Product (PROBIOTIC & ACIDOPHILUS EX ST PO) Take 1 tablet by mouth daily.     . prochlorperazine (COMPAZINE) 10 MG tablet Take 1 tablet (10 mg total) by mouth every 6 (six) hours as needed for nausea or vomiting. 30 tablet 2  . ranitidine (ZANTAC) 75 MG tablet Take 75 mg by mouth daily as needed for heartburn.     . Vitamin D, Ergocalciferol, (DRISDOL) 50000 units CAPS capsule TAKE 1 CAPSULE (50,000 UNITS TOTAL) BY MOUTH EVERY SATURDAY. IN THE MORNING. 12 capsule 0   No current facility-administered medications for this visit.    Facility-Administered Medications Ordered in Other Visits  Medication Dose Route Frequency Provider Last Rate Last Dose  . 0.9 %  sodium chloride infusion   Intravenous Continuous [name], MD   Stopped at 06/28/17 1450  . 0.9 %  sodium chloride infusion   Intravenous Continuous [name], MD   Stopped at 07/29/17 1515  . heparin lock flush 100 unit/mL  500 Units Intravenous Once Charlaine Dalton R, MD      . sodium chloride flush (NS) 0.9 % injection 10 mL  10 mL Intravenous PRN Charlaine Dalton R, MD      . sodium chloride flush (NS) 0.9 % injection 10 mL  10 mL Intravenous PRN Verlon Au, NP      . sodium chloride flush (NS) 0.9 % injection 10 mL  10 mL Intravenous PRN [name], MD   10 mL at 07/29/17 1405    PHYSICAL EXAMINATION: ECOG PERFORMANCE STATUS: 2 - Symptomatic, <50% confined to bed  BP 103/68 (BP Location: Left Arm, Patient Position: Sitting)   Pulse (!) 116   Temp 98.6 F (37 C) (Tympanic)   Resp 20   Ht _0  (1.549 m)   Wt 125 lb 6.4 oz (56.9 kg)   BMI 23.69 kg/m   Filed Weights   09/16/17 0936  Weight: 125 lb 6.4 oz (56.9 kg)    GENERAL: Well-nourished well-developed; Alert, no distress and comfortable. Accompanied by family.  EYES: Positive for pallor no icterus OROPHARYNX: no thrush or ulceration; NECK: supple; no lymph nodes felt. LYMPH:  no palpable  lymphadenopathy in the axillary or inguinal regions LUNGS: Decreased breath sounds auscultation bilaterally. No wheeze or crackles HEART/CVS: regular rate & rhythm and no murmurs; No lower extremity edema ABDOMEN:abdomen soft, non-tender and normal bowel sounds. No hepatomegaly or splenomegaly.  Musculoskeletal:no cyanosis of digits and no clubbing  PSYCH: alert & oriented x 3 with fluent speech NEURO: no focal motor/sensory deficits SKIN:  no rashes or significant lesions    LABORATORY DATA:  I have  reviewed the data as listed    Component Value Date/Time   NA 137 08/29/2017 0958   K 3.6 08/29/2017 0958   CL 102 08/29/2017 0958   CO2 25 08/29/2017 0958   GLUCOSE 133 (H) 08/29/2017 0958   BUN 20 08/29/2017 0958   CREATININE 0.99 08/29/2017 0958   CREATININE 0.67 07/09/2014 1321   CREATININE 0.70 04/30/2014 1455   CALCIUM 9.2 08/29/2017 0958   PROT 5.8 (L) 08/05/2017 1052   PROT 7.3 07/09/2014 1321   ALBUMIN 2.4 (L) 08/05/2017 1052   ALBUMIN 4.3 07/09/2014 1321   AST 21 08/05/2017 1052   AST 19 07/09/2014 1321   ALT 24 08/05/2017 1052   ALT 18 07/09/2014 1321   ALKPHOS 51 08/05/2017 1052   ALKPHOS 66 07/09/2014 1321   BILITOT 0.6 08/05/2017 1052   BILITOT 0.6 07/09/2014 1321   GFRNONAA 57 (L) 08/29/2017 0958   GFRNONAA >60 07/09/2014 1321   GFRAA >60 08/29/2017 0958   GFRAA >60 07/09/2014 1321    No results found for: SPEP, UPEP  Lab Results  Component Value Date   WBC 2.7 (L) 09/16/2017   NEUTROABS 1.4 09/16/2017   HGB 8.7 (L) 09/16/2017   HCT 24.4 (L) 09/16/2017   MCV 86.6 09/16/2017   PLT 44 (L) 09/16/2017      Chemistry      Component Value Date/Time   NA 137 08/29/2017 0958   K 3.6 08/29/2017 0958   CL 102 08/29/2017 0958   CO2 25 08/29/2017 0958   BUN 20 08/29/2017 0958   CREATININE 0.99 08/29/2017 0958   CREATININE 0.67 07/09/2014 1321   CREATININE 0.70 04/30/2014 1455      Component Value Date/Time   CALCIUM 9.2 08/29/2017 0958   ALKPHOS 51  08/05/2017 1052   ALKPHOS 66 07/09/2014 1321   AST 21 08/05/2017 1052   AST 19 07/09/2014 1321   ALT 24 08/05/2017 1052   ALT 18 07/09/2014 1321   BILITOT 0.6 08/05/2017 1052   BILITOT 0.6 07/09/2014 1321       RADIOGRAPHIC STUDIES: I have personally reviewed the radiological images as listed and agreed with the findings in the report. No results found.   ASSESSMENT & PLAN:  MDS (myelodysplastic syndrome) ([place]) # Highly suspicious for MDS-high-grade vs "bone marrow failure"-of unclear etiology currently status post Dacogen [April 1st 2019].  Currently on further Dacogen hold because of worsening anemia thrombocytopenia neutropenia.  #Repeat bone marrow biopsy [July 2019]-shows variable cellularity between 10 to 70%; with scant erythroid and megakaryocytic precursors; left shifted myeloid lineage [likely from growth factor support]; no evidence of any blasts-no conclusive diagnosis noted.  No evidence of metastatic breast cancer noted. Discussed with Dr.Smir.   #I had a long discussion with the patient family regarding-inconclusive nature of the bone marrow biopsy.  Discussed this options include #continued best supportive care with-transfusions; growth factor support. #Rechallenge with Dacogen-patient family concerned #given the possibility of immune mediated bone marrow suppression-challenging with immunosuppressive therapy.  A biopsy discussed with [facility] Royce Macadamia at [facility].    #Also discussed regarding evaluation at [facility] clinic.  Patient interested but not too keen.  Will initiate referral to [facility].  #Severe anemia/severe thrombocytopenia/severe neutropenia-continue supportive therapy with PRBC once a week; crossmatch platelets 1-2 times a week; Granix as needed.  Today hemoglobin is 8.4; platelets 40s; ANC- 1.4.  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 On Femara.;  PET scan April 2019-stable disease no evidence of any  active  breast cancer.  # vaginal bleeding-likely secondary to vaginal atrophy/low platelets.- improved.  # Monday/Thursday- labs; Friday transfusion.  Patient to follow-up with me on 18th  #Patient planning vacation starting 27th July- aug 3rd; we will plan transfusion/around patient's vacation.  # 40 minutes face-to-face with the patient discussing the above plan of care; more than 50% of time spent on prognosis/ natural history; counseling and coordination.      No orders of the defined types were placed in this encounter.  All questions were answered. The patient knows to call the clinic with any problems, questions or concerns.      [name], MD 09/16/2017 10:21 PM

## 2017-09-17 ENCOUNTER — Ambulatory Visit: Payer: Medicare Other

## 2017-09-17 ENCOUNTER — Encounter (HOSPITAL_COMMUNITY): Payer: Self-pay | Admitting: Internal Medicine

## 2017-09-18 ENCOUNTER — Other Ambulatory Visit: Payer: Self-pay | Admitting: Internal Medicine

## 2017-09-18 ENCOUNTER — Inpatient Hospital Stay: Payer: Medicare Other

## 2017-09-18 ENCOUNTER — Other Ambulatory Visit: Payer: Self-pay | Admitting: *Deleted

## 2017-09-18 ENCOUNTER — Other Ambulatory Visit: Payer: Self-pay

## 2017-09-18 DIAGNOSIS — D649 Anemia, unspecified: Secondary | ICD-10-CM

## 2017-09-18 DIAGNOSIS — D469 Myelodysplastic syndrome, unspecified: Secondary | ICD-10-CM | POA: Diagnosis not present

## 2017-09-18 DIAGNOSIS — D696 Thrombocytopenia, unspecified: Secondary | ICD-10-CM

## 2017-09-18 DIAGNOSIS — D61818 Other pancytopenia: Secondary | ICD-10-CM | POA: Diagnosis not present

## 2017-09-18 DIAGNOSIS — Z170 Estrogen receptor positive status [ER+]: Secondary | ICD-10-CM | POA: Diagnosis not present

## 2017-09-18 DIAGNOSIS — C50811 Malignant neoplasm of overlapping sites of right female breast: Secondary | ICD-10-CM | POA: Diagnosis not present

## 2017-09-18 DIAGNOSIS — Z79811 Long term (current) use of aromatase inhibitors: Secondary | ICD-10-CM | POA: Diagnosis not present

## 2017-09-18 DIAGNOSIS — Z8679 Personal history of other diseases of the circulatory system: Secondary | ICD-10-CM | POA: Diagnosis not present

## 2017-09-18 LAB — CBC WITH DIFFERENTIAL/PLATELET
Basophils Absolute: 0 10*3/uL (ref 0–0.1)
Basophils Relative: 0 %
EOS ABS: 0 10*3/uL (ref 0–0.7)
EOS PCT: 0 %
HCT: 21.5 % — ABNORMAL LOW (ref 35.0–47.0)
Hemoglobin: 7.6 g/dL — ABNORMAL LOW (ref 12.0–16.0)
Lymphocytes Relative: 53 %
Lymphs Abs: 1.3 10*3/uL (ref 1.0–3.6)
MCH: 30.5 pg (ref 26.0–34.0)
MCHC: 35.5 g/dL (ref 32.0–36.0)
MCV: 86.1 fL (ref 80.0–100.0)
MONO ABS: 0.2 10*3/uL (ref 0.2–0.9)
MONOS PCT: 6 %
Neutro Abs: 1 10*3/uL — ABNORMAL LOW (ref 1.4–6.5)
Neutrophils Relative %: 41 %
PLATELETS: 23 10*3/uL — AB (ref 150–400)
RBC: 2.5 MIL/uL — ABNORMAL LOW (ref 3.80–5.20)
RDW: 13.4 % (ref 11.5–14.5)
WBC: 2.5 10*3/uL — ABNORMAL LOW (ref 3.6–11.0)

## 2017-09-18 LAB — PREPARE RBC (CROSSMATCH)

## 2017-09-18 LAB — SAMPLE TO BLOOD BANK

## 2017-09-19 ENCOUNTER — Inpatient Hospital Stay: Payer: Medicare Other

## 2017-09-19 VITALS — BP 129/85 | HR 90 | Temp 97.0°F | Resp 20

## 2017-09-19 DIAGNOSIS — C50811 Malignant neoplasm of overlapping sites of right female breast: Secondary | ICD-10-CM | POA: Diagnosis not present

## 2017-09-19 DIAGNOSIS — D693 Immune thrombocytopenic purpura: Secondary | ICD-10-CM

## 2017-09-19 DIAGNOSIS — D696 Thrombocytopenia, unspecified: Secondary | ICD-10-CM

## 2017-09-19 DIAGNOSIS — D649 Anemia, unspecified: Secondary | ICD-10-CM

## 2017-09-19 DIAGNOSIS — Z170 Estrogen receptor positive status [ER+]: Secondary | ICD-10-CM

## 2017-09-19 DIAGNOSIS — D469 Myelodysplastic syndrome, unspecified: Secondary | ICD-10-CM | POA: Diagnosis not present

## 2017-09-19 DIAGNOSIS — D61818 Other pancytopenia: Secondary | ICD-10-CM | POA: Diagnosis not present

## 2017-09-19 DIAGNOSIS — Z8679 Personal history of other diseases of the circulatory system: Secondary | ICD-10-CM | POA: Diagnosis not present

## 2017-09-19 DIAGNOSIS — Z79811 Long term (current) use of aromatase inhibitors: Secondary | ICD-10-CM | POA: Diagnosis not present

## 2017-09-19 MED ORDER — METHYLPREDNISOLONE SODIUM SUCC 125 MG IJ SOLR
40.0000 mg | Freq: Once | INTRAMUSCULAR | Status: AC
  Administered 2017-09-19: 40 mg via INTRAVENOUS
  Filled 2017-09-19: qty 2

## 2017-09-19 MED ORDER — DIPHENHYDRAMINE HCL 25 MG PO CAPS
25.0000 mg | ORAL_CAPSULE | Freq: Once | ORAL | Status: AC
  Administered 2017-09-19: 25 mg via ORAL
  Filled 2017-09-19: qty 1

## 2017-09-19 MED ORDER — ACETAMINOPHEN 325 MG PO TABS
650.0000 mg | ORAL_TABLET | Freq: Once | ORAL | Status: AC
  Administered 2017-09-19: 650 mg via ORAL
  Filled 2017-09-19: qty 2

## 2017-09-19 MED ORDER — TBO-FILGRASTIM 480 MCG/0.8ML ~~LOC~~ SOSY
480.0000 ug | PREFILLED_SYRINGE | Freq: Once | SUBCUTANEOUS | Status: AC
Start: 2017-09-20 — End: 2017-09-19
  Administered 2017-09-19: 480 ug via SUBCUTANEOUS

## 2017-09-19 MED ORDER — HEPARIN SOD (PORK) LOCK FLUSH 100 UNIT/ML IV SOLN
500.0000 [IU] | Freq: Every day | INTRAVENOUS | Status: AC | PRN
  Administered 2017-09-19: 500 [IU]
  Filled 2017-09-19 (×2): qty 5

## 2017-09-20 ENCOUNTER — Ambulatory Visit: Payer: Medicare Other

## 2017-09-20 LAB — TYPE AND SCREEN
ABO/RH(D): O POS
Antibody Screen: POSITIVE
Donor AG Type: NEGATIVE
UNIT DIVISION: 0

## 2017-09-20 LAB — PREPARE PLATELET PHERESIS: Unit division: 0

## 2017-09-20 LAB — BPAM RBC
Blood Product Expiration Date: 201907212359
ISSUE DATE / TIME: 201907111214
UNIT TYPE AND RH: 5100

## 2017-09-20 LAB — BPAM PLATELET PHERESIS
Blood Product Expiration Date: 201907111430
ISSUE DATE / TIME: 201907111353
UNIT TYPE AND RH: 7300

## 2017-09-23 ENCOUNTER — Other Ambulatory Visit: Payer: Self-pay | Admitting: *Deleted

## 2017-09-23 ENCOUNTER — Inpatient Hospital Stay: Payer: Medicare Other

## 2017-09-23 DIAGNOSIS — D61818 Other pancytopenia: Secondary | ICD-10-CM | POA: Diagnosis not present

## 2017-09-23 DIAGNOSIS — D469 Myelodysplastic syndrome, unspecified: Secondary | ICD-10-CM | POA: Diagnosis not present

## 2017-09-23 DIAGNOSIS — Z170 Estrogen receptor positive status [ER+]: Secondary | ICD-10-CM | POA: Diagnosis not present

## 2017-09-23 DIAGNOSIS — Z8679 Personal history of other diseases of the circulatory system: Secondary | ICD-10-CM | POA: Diagnosis not present

## 2017-09-23 DIAGNOSIS — C50811 Malignant neoplasm of overlapping sites of right female breast: Secondary | ICD-10-CM | POA: Diagnosis not present

## 2017-09-23 DIAGNOSIS — D696 Thrombocytopenia, unspecified: Secondary | ICD-10-CM

## 2017-09-23 DIAGNOSIS — Z79811 Long term (current) use of aromatase inhibitors: Secondary | ICD-10-CM | POA: Diagnosis not present

## 2017-09-23 LAB — CBC WITH DIFFERENTIAL/PLATELET
Basophils Absolute: 0 10*3/uL (ref 0–0.1)
Basophils Relative: 0 %
EOS PCT: 0 %
Eosinophils Absolute: 0 10*3/uL (ref 0–0.7)
HCT: 24.2 % — ABNORMAL LOW (ref 35.0–47.0)
Hemoglobin: 8.5 g/dL — ABNORMAL LOW (ref 12.0–16.0)
LYMPHS ABS: 1.2 10*3/uL (ref 1.0–3.6)
LYMPHS PCT: 48 %
MCH: 30.6 pg (ref 26.0–34.0)
MCHC: 35 g/dL (ref 32.0–36.0)
MCV: 87.3 fL (ref 80.0–100.0)
MONO ABS: 0.2 10*3/uL (ref 0.2–0.9)
Monocytes Relative: 8 %
Neutro Abs: 1.1 10*3/uL — ABNORMAL LOW (ref 1.4–6.5)
Neutrophils Relative %: 44 %
Platelets: 11 10*3/uL — CL (ref 150–400)
RBC: 2.78 MIL/uL — ABNORMAL LOW (ref 3.80–5.20)
RDW: 13 % (ref 11.5–14.5)
WBC: 2.6 10*3/uL — ABNORMAL LOW (ref 3.6–11.0)

## 2017-09-23 LAB — SAMPLE TO BLOOD BANK

## 2017-09-24 ENCOUNTER — Encounter: Payer: Self-pay | Admitting: Internal Medicine

## 2017-09-24 ENCOUNTER — Inpatient Hospital Stay: Payer: Medicare Other

## 2017-09-24 VITALS — BP 120/77 | HR 90 | Temp 97.3°F | Resp 20

## 2017-09-24 DIAGNOSIS — Z79811 Long term (current) use of aromatase inhibitors: Secondary | ICD-10-CM | POA: Diagnosis not present

## 2017-09-24 DIAGNOSIS — D693 Immune thrombocytopenic purpura: Secondary | ICD-10-CM

## 2017-09-24 DIAGNOSIS — Z170 Estrogen receptor positive status [ER+]: Secondary | ICD-10-CM

## 2017-09-24 DIAGNOSIS — D469 Myelodysplastic syndrome, unspecified: Secondary | ICD-10-CM | POA: Diagnosis not present

## 2017-09-24 DIAGNOSIS — Z8679 Personal history of other diseases of the circulatory system: Secondary | ICD-10-CM | POA: Diagnosis not present

## 2017-09-24 DIAGNOSIS — C50811 Malignant neoplasm of overlapping sites of right female breast: Secondary | ICD-10-CM | POA: Diagnosis not present

## 2017-09-24 DIAGNOSIS — D61818 Other pancytopenia: Secondary | ICD-10-CM | POA: Diagnosis not present

## 2017-09-24 DIAGNOSIS — D696 Thrombocytopenia, unspecified: Secondary | ICD-10-CM

## 2017-09-24 MED ORDER — ACETAMINOPHEN 325 MG PO TABS
650.0000 mg | ORAL_TABLET | Freq: Once | ORAL | Status: AC
  Administered 2017-09-24: 650 mg via ORAL
  Filled 2017-09-24: qty 2

## 2017-09-24 MED ORDER — SODIUM CHLORIDE 0.9% FLUSH
10.0000 mL | INTRAVENOUS | Status: DC | PRN
  Filled 2017-09-24: qty 10

## 2017-09-24 MED ORDER — HEPARIN SOD (PORK) LOCK FLUSH 100 UNIT/ML IV SOLN
500.0000 [IU] | Freq: Once | INTRAVENOUS | Status: AC
  Administered 2017-09-24: 500 [IU] via INTRAVENOUS
  Filled 2017-09-24: qty 5

## 2017-09-24 MED ORDER — DIPHENHYDRAMINE HCL 25 MG PO CAPS
25.0000 mg | ORAL_CAPSULE | Freq: Once | ORAL | Status: AC
  Administered 2017-09-24: 25 mg via ORAL
  Filled 2017-09-24: qty 1

## 2017-09-24 MED ORDER — METHYLPREDNISOLONE SODIUM SUCC 125 MG IJ SOLR
40.0000 mg | Freq: Once | INTRAMUSCULAR | Status: AC
  Administered 2017-09-24: 40 mg via INTRAVENOUS
  Filled 2017-09-24: qty 2

## 2017-09-24 MED ORDER — TBO-FILGRASTIM 480 MCG/0.8ML ~~LOC~~ SOSY
480.0000 ug | PREFILLED_SYRINGE | Freq: Once | SUBCUTANEOUS | Status: AC
  Administered 2017-09-24: 480 ug via SUBCUTANEOUS

## 2017-09-25 LAB — BPAM PLATELET PHERESIS
Blood Product Expiration Date: 201907161500
ISSUE DATE / TIME: 201907161419
Unit Type and Rh: 5100

## 2017-09-25 LAB — PREPARE PLATELET PHERESIS: Unit division: 0

## 2017-09-26 ENCOUNTER — Other Ambulatory Visit: Payer: Self-pay | Admitting: *Deleted

## 2017-09-26 ENCOUNTER — Inpatient Hospital Stay: Payer: Medicare Other

## 2017-09-26 DIAGNOSIS — C50811 Malignant neoplasm of overlapping sites of right female breast: Secondary | ICD-10-CM | POA: Diagnosis not present

## 2017-09-26 DIAGNOSIS — D649 Anemia, unspecified: Secondary | ICD-10-CM

## 2017-09-26 DIAGNOSIS — Z170 Estrogen receptor positive status [ER+]: Secondary | ICD-10-CM | POA: Diagnosis not present

## 2017-09-26 DIAGNOSIS — Z8679 Personal history of other diseases of the circulatory system: Secondary | ICD-10-CM | POA: Diagnosis not present

## 2017-09-26 DIAGNOSIS — D469 Myelodysplastic syndrome, unspecified: Secondary | ICD-10-CM

## 2017-09-26 DIAGNOSIS — Z79811 Long term (current) use of aromatase inhibitors: Secondary | ICD-10-CM | POA: Diagnosis not present

## 2017-09-26 DIAGNOSIS — D696 Thrombocytopenia, unspecified: Secondary | ICD-10-CM

## 2017-09-26 DIAGNOSIS — D61818 Other pancytopenia: Secondary | ICD-10-CM | POA: Diagnosis not present

## 2017-09-26 LAB — CBC WITH DIFFERENTIAL/PLATELET
BASOS ABS: 0 10*3/uL (ref 0–0.1)
BASOS PCT: 1 %
Eosinophils Absolute: 0 10*3/uL (ref 0–0.7)
Eosinophils Relative: 0 %
HEMATOCRIT: 22.8 % — AB (ref 35.0–47.0)
Hemoglobin: 7.9 g/dL — ABNORMAL LOW (ref 12.0–16.0)
Lymphocytes Relative: 26 %
Lymphs Abs: 2 10*3/uL (ref 1.0–3.6)
MCH: 30.2 pg (ref 26.0–34.0)
MCHC: 34.5 g/dL (ref 32.0–36.0)
MCV: 87.8 fL (ref 80.0–100.0)
Monocytes Absolute: 0.3 10*3/uL (ref 0.2–0.9)
Monocytes Relative: 5 %
NEUTROS ABS: 5.1 10*3/uL (ref 1.4–6.5)
NEUTROS PCT: 68 %
Platelets: 26 10*3/uL — CL (ref 150–400)
RBC: 2.6 MIL/uL — ABNORMAL LOW (ref 3.80–5.20)
RDW: 12.8 % (ref 11.5–14.5)
WBC: 7.5 10*3/uL (ref 3.6–11.0)

## 2017-09-26 LAB — PREPARE RBC (CROSSMATCH)

## 2017-09-26 LAB — SAMPLE TO BLOOD BANK

## 2017-09-27 ENCOUNTER — Inpatient Hospital Stay: Payer: Medicare Other

## 2017-09-27 ENCOUNTER — Telehealth: Payer: Self-pay | Admitting: *Deleted

## 2017-09-27 VITALS — BP 148/91 | HR 101 | Temp 96.2°F | Resp 18

## 2017-09-27 DIAGNOSIS — Z8679 Personal history of other diseases of the circulatory system: Secondary | ICD-10-CM | POA: Diagnosis not present

## 2017-09-27 DIAGNOSIS — D696 Thrombocytopenia, unspecified: Secondary | ICD-10-CM

## 2017-09-27 DIAGNOSIS — Z79811 Long term (current) use of aromatase inhibitors: Secondary | ICD-10-CM | POA: Diagnosis not present

## 2017-09-27 DIAGNOSIS — D649 Anemia, unspecified: Secondary | ICD-10-CM

## 2017-09-27 DIAGNOSIS — C50811 Malignant neoplasm of overlapping sites of right female breast: Secondary | ICD-10-CM | POA: Diagnosis not present

## 2017-09-27 DIAGNOSIS — Z170 Estrogen receptor positive status [ER+]: Secondary | ICD-10-CM | POA: Diagnosis not present

## 2017-09-27 DIAGNOSIS — D61818 Other pancytopenia: Secondary | ICD-10-CM | POA: Diagnosis not present

## 2017-09-27 DIAGNOSIS — R600 Localized edema: Secondary | ICD-10-CM

## 2017-09-27 DIAGNOSIS — R300 Dysuria: Secondary | ICD-10-CM

## 2017-09-27 DIAGNOSIS — D469 Myelodysplastic syndrome, unspecified: Secondary | ICD-10-CM | POA: Diagnosis not present

## 2017-09-27 LAB — URINALYSIS, COMPLETE (UACMP) WITH MICROSCOPIC
Bacteria, UA: NONE SEEN
Bilirubin Urine: NEGATIVE
GLUCOSE, UA: NEGATIVE mg/dL
HGB URINE DIPSTICK: NEGATIVE
Ketones, ur: NEGATIVE mg/dL
Nitrite: NEGATIVE
Protein, ur: NEGATIVE mg/dL
Specific Gravity, Urine: 1.021 (ref 1.005–1.030)
WBC, UA: >50 WBC/hpf — ABNORMAL HIGH (ref 0–5)
pH: 5 (ref 5.0–8.0)

## 2017-09-27 LAB — BASIC METABOLIC PANEL
Anion gap: 11 (ref 5–15)
BUN: 18 mg/dL (ref 8–23)
CHLORIDE: 102 mmol/L (ref 98–111)
CO2: 25 mmol/L (ref 22–32)
Calcium: 9.4 mg/dL (ref 8.9–10.3)
Creatinine, Ser: 0.88 mg/dL (ref 0.44–1.00)
GFR calc Af Amer: >60 mL/min (ref >60)
GFR calc non Af Amer: >60 mL/min (ref >60)
Glucose, Bld: 163 mg/dL — ABNORMAL HIGH (ref 70–99)
POTASSIUM: 4 mmol/L (ref 3.5–5.1)
SODIUM: 138 mmol/L (ref 135–145)

## 2017-09-27 MED ORDER — HEPARIN SOD (PORK) LOCK FLUSH 100 UNIT/ML IV SOLN
500.0000 [IU] | Freq: Every day | INTRAVENOUS | Status: AC | PRN
  Administered 2017-09-27: 500 [IU]
  Filled 2017-09-27: qty 5

## 2017-09-27 MED ORDER — METHYLPREDNISOLONE SODIUM SUCC 125 MG IJ SOLR
40.0000 mg | Freq: Once | INTRAMUSCULAR | Status: AC
  Administered 2017-09-27: 40 mg via INTRAVENOUS
  Filled 2017-09-27: qty 2

## 2017-09-27 MED ORDER — ACETAMINOPHEN 325 MG PO TABS
650.0000 mg | ORAL_TABLET | Freq: Once | ORAL | Status: AC
  Administered 2017-09-27: 650 mg via ORAL
  Filled 2017-09-27: qty 2

## 2017-09-27 MED ORDER — DIPHENHYDRAMINE HCL 25 MG PO CAPS
25.0000 mg | ORAL_CAPSULE | Freq: Once | ORAL | Status: AC
  Administered 2017-09-27: 25 mg via ORAL
  Filled 2017-09-27: qty 1

## 2017-09-27 MED ORDER — SODIUM CHLORIDE 0.9 % IV SOLN
250.0000 mL | Freq: Once | INTRAVENOUS | Status: AC
  Administered 2017-09-27: 250 mL via INTRAVENOUS
  Filled 2017-09-27: qty 250

## 2017-09-27 MED ORDER — SODIUM CHLORIDE 0.9% FLUSH
10.0000 mL | INTRAVENOUS | Status: DC | PRN
  Filled 2017-09-27: qty 10

## 2017-09-27 NOTE — Telephone Encounter (Signed)
Rescheduled patient 12/04/17

## 2017-09-27 NOTE — Telephone Encounter (Signed)
Copied from [place] [id number]. Topic: Appointment Scheduling - Scheduling Inquiry for Clinic >> Sep 27, 2017  8:29 AM Shelly Arias wrote: Reason for CRM: pt calling to [facility] appt 8/22 when Dr. Caryl Bis is out of office. Pt has to go to the cancer center Tuesday's and Thursday's. She would like a Monday or Wednesday appt 10:00-11:30 range. First available I found was in October. Please call pt.

## 2017-09-27 NOTE — Progress Notes (Signed)
Pt reports the swelling in bilateral feet and ankles have worsened and became more frequent. Pt states she developed chest pain after drinking orange juice which was relieved with antacid. Dr. Rogue Bussing aware and at chairside to assess.

## 2017-09-28 LAB — TYPE AND SCREEN
ABO/RH(D): O POS
Antibody Screen: POSITIVE
Donor AG Type: NEGATIVE
UNIT DIVISION: 0

## 2017-09-28 LAB — BPAM PLATELET PHERESIS
BLOOD PRODUCT EXPIRATION DATE: 201907191430
ISSUE DATE / TIME: 201907191349
UNIT TYPE AND RH: 5100

## 2017-09-28 LAB — BPAM RBC
BLOOD PRODUCT EXPIRATION DATE: 201907222359
ISSUE DATE / TIME: 201907191114
Unit Type and Rh: 5100

## 2017-09-28 LAB — PREPARE PLATELET PHERESIS: UNIT DIVISION: 0

## 2017-09-30 ENCOUNTER — Ambulatory Visit: Payer: Medicare Other | Admitting: Oncology

## 2017-09-30 ENCOUNTER — Other Ambulatory Visit: Payer: Medicare Other

## 2017-09-30 DIAGNOSIS — D469 Myelodysplastic syndrome, unspecified: Secondary | ICD-10-CM | POA: Diagnosis not present

## 2017-10-01 ENCOUNTER — Telehealth: Payer: Self-pay | Admitting: Internal Medicine

## 2017-10-01 ENCOUNTER — Ambulatory Visit: Payer: Medicare Other

## 2017-10-01 NOTE — Telephone Encounter (Signed)
Spoke to [facility] clinic referral line this AM- also gave the number for pt's husband/cell to contact asap.   [facility] will fax [place] the required documents [summary letter; medical records; CDs- CT/PETs; Biopsy slides]  Fax: [phone]  Address to mail: [address] [place], [place] [address].

## 2017-10-02 ENCOUNTER — Encounter: Payer: Self-pay | Admitting: *Deleted

## 2017-10-02 ENCOUNTER — Ambulatory Visit: Payer: Medicare Other

## 2017-10-02 DIAGNOSIS — D469 Myelodysplastic syndrome, unspecified: Secondary | ICD-10-CM | POA: Diagnosis not present

## 2017-10-02 NOTE — Telephone Encounter (Signed)
I personally contacted [facility] as the last few office notes has already been faxed to the [facility] clinic. After a long discussion, the staff determined that only pathology slides were needed. Summary review is on the last office note.  Request for path slides has been fwd to [facility] pathology dept.

## 2017-10-03 ENCOUNTER — Other Ambulatory Visit: Payer: Self-pay | Admitting: *Deleted

## 2017-10-03 ENCOUNTER — Telehealth: Payer: Self-pay | Admitting: Internal Medicine

## 2017-10-03 DIAGNOSIS — D696 Thrombocytopenia, unspecified: Secondary | ICD-10-CM

## 2017-10-03 DIAGNOSIS — D469 Myelodysplastic syndrome, unspecified: Secondary | ICD-10-CM | POA: Diagnosis not present

## 2017-10-03 DIAGNOSIS — Z170 Estrogen receptor positive status [ER+]: Secondary | ICD-10-CM | POA: Diagnosis not present

## 2017-10-03 DIAGNOSIS — Z8679 Personal history of other diseases of the circulatory system: Secondary | ICD-10-CM | POA: Diagnosis not present

## 2017-10-03 DIAGNOSIS — D61818 Other pancytopenia: Secondary | ICD-10-CM | POA: Diagnosis not present

## 2017-10-03 DIAGNOSIS — C50811 Malignant neoplasm of overlapping sites of right female breast: Secondary | ICD-10-CM | POA: Diagnosis not present

## 2017-10-03 DIAGNOSIS — Z79811 Long term (current) use of aromatase inhibitors: Secondary | ICD-10-CM | POA: Diagnosis not present

## 2017-10-03 NOTE — Telephone Encounter (Signed)
Spoke to pt/daughter re: platelets- 8; no bleeding noted. my preference would be platelet transfusion today.  [place]  Family willing to travel today for platelet transfusion if possible.recommend taking lysteda when at home.   Will check with staff/chair availability for platelet transfusion TODAY.   Brooke- please check on this ASAP.   Thx

## 2017-10-03 NOTE — Telephone Encounter (Signed)
Spoke with blood bank. Will order plts for tomorrow. No need for additional blood draw today per blood bank.  Patient contacted. Given apt for tomorrow at 1:30 pm in cancer center for plts. Pt will keep all other apts.

## 2017-10-04 ENCOUNTER — Inpatient Hospital Stay: Payer: Medicare Other

## 2017-10-04 VITALS — BP 107/70 | HR 88 | Temp 97.0°F | Resp 20

## 2017-10-04 DIAGNOSIS — Z8679 Personal history of other diseases of the circulatory system: Secondary | ICD-10-CM | POA: Diagnosis not present

## 2017-10-04 DIAGNOSIS — C50811 Malignant neoplasm of overlapping sites of right female breast: Secondary | ICD-10-CM

## 2017-10-04 DIAGNOSIS — D696 Thrombocytopenia, unspecified: Secondary | ICD-10-CM

## 2017-10-04 DIAGNOSIS — D469 Myelodysplastic syndrome, unspecified: Secondary | ICD-10-CM

## 2017-10-04 DIAGNOSIS — D61818 Other pancytopenia: Secondary | ICD-10-CM | POA: Diagnosis not present

## 2017-10-04 DIAGNOSIS — Z79811 Long term (current) use of aromatase inhibitors: Secondary | ICD-10-CM | POA: Diagnosis not present

## 2017-10-04 DIAGNOSIS — Z170 Estrogen receptor positive status [ER+]: Secondary | ICD-10-CM | POA: Diagnosis not present

## 2017-10-04 DIAGNOSIS — D693 Immune thrombocytopenic purpura: Secondary | ICD-10-CM

## 2017-10-04 MED ORDER — METHYLPREDNISOLONE SODIUM SUCC 125 MG IJ SOLR
40.0000 mg | Freq: Once | INTRAMUSCULAR | Status: AC
  Administered 2017-10-04: 40 mg via INTRAVENOUS
  Filled 2017-10-04: qty 2

## 2017-10-04 MED ORDER — HEPARIN SOD (PORK) LOCK FLUSH 100 UNIT/ML IV SOLN
500.0000 [IU] | Freq: Every day | INTRAVENOUS | Status: AC | PRN
  Administered 2017-10-04: 500 [IU]
  Filled 2017-10-04: qty 5

## 2017-10-04 MED ORDER — ACETAMINOPHEN 325 MG PO TABS
650.0000 mg | ORAL_TABLET | Freq: Once | ORAL | Status: AC
  Administered 2017-10-04: 650 mg via ORAL

## 2017-10-04 MED ORDER — DIPHENHYDRAMINE HCL 25 MG PO CAPS
25.0000 mg | ORAL_CAPSULE | Freq: Once | ORAL | Status: AC
  Administered 2017-10-04: 25 mg via ORAL
  Filled 2017-10-04: qty 1

## 2017-10-04 MED ORDER — CYANOCOBALAMIN 1000 MCG/ML IJ SOLN
1000.0000 ug | Freq: Once | INTRAMUSCULAR | Status: AC
  Administered 2017-10-04: 1000 ug via INTRAMUSCULAR
  Filled 2017-10-04: qty 1

## 2017-10-04 MED ORDER — SODIUM CHLORIDE 0.9% IV SOLUTION
250.0000 mL | Freq: Once | INTRAVENOUS | Status: AC
  Administered 2017-10-04: 250 mL via INTRAVENOUS
  Filled 2017-10-04: qty 250

## 2017-10-05 LAB — PREPARE PLATELET PHERESIS: UNIT DIVISION: 0

## 2017-10-05 LAB — BPAM PLATELET PHERESIS
Blood Product Expiration Date: 201907261445
ISSUE DATE / TIME: 201907261422
Unit Type and Rh: 5100

## 2017-10-07 ENCOUNTER — Inpatient Hospital Stay: Payer: Medicare Other

## 2017-10-07 ENCOUNTER — Inpatient Hospital Stay (HOSPITAL_BASED_OUTPATIENT_CLINIC_OR_DEPARTMENT_OTHER): Payer: Medicare Other | Admitting: Internal Medicine

## 2017-10-07 ENCOUNTER — Other Ambulatory Visit: Payer: Self-pay

## 2017-10-07 ENCOUNTER — Other Ambulatory Visit: Payer: Self-pay | Admitting: *Deleted

## 2017-10-07 VITALS — BP 115/69 | HR 121 | Temp 98.1°F | Resp 16 | Wt 129.0 lb

## 2017-10-07 DIAGNOSIS — Z9221 Personal history of antineoplastic chemotherapy: Secondary | ICD-10-CM

## 2017-10-07 DIAGNOSIS — I10 Essential (primary) hypertension: Secondary | ICD-10-CM

## 2017-10-07 DIAGNOSIS — Z803 Family history of malignant neoplasm of breast: Secondary | ICD-10-CM

## 2017-10-07 DIAGNOSIS — M810 Age-related osteoporosis without current pathological fracture: Secondary | ICD-10-CM

## 2017-10-07 DIAGNOSIS — K219 Gastro-esophageal reflux disease without esophagitis: Secondary | ICD-10-CM | POA: Diagnosis not present

## 2017-10-07 DIAGNOSIS — M1990 Unspecified osteoarthritis, unspecified site: Secondary | ICD-10-CM

## 2017-10-07 DIAGNOSIS — D469 Myelodysplastic syndrome, unspecified: Secondary | ICD-10-CM

## 2017-10-07 DIAGNOSIS — D61818 Other pancytopenia: Secondary | ICD-10-CM | POA: Diagnosis not present

## 2017-10-07 DIAGNOSIS — Z79899 Other long term (current) drug therapy: Secondary | ICD-10-CM

## 2017-10-07 DIAGNOSIS — R5383 Other fatigue: Secondary | ICD-10-CM | POA: Diagnosis not present

## 2017-10-07 DIAGNOSIS — Z801 Family history of malignant neoplasm of trachea, bronchus and lung: Secondary | ICD-10-CM

## 2017-10-07 DIAGNOSIS — C50811 Malignant neoplasm of overlapping sites of right female breast: Secondary | ICD-10-CM | POA: Diagnosis not present

## 2017-10-07 DIAGNOSIS — Z923 Personal history of irradiation: Secondary | ICD-10-CM

## 2017-10-07 DIAGNOSIS — N920 Excessive and frequent menstruation with regular cycle: Secondary | ICD-10-CM

## 2017-10-07 DIAGNOSIS — Z170 Estrogen receptor positive status [ER+]: Secondary | ICD-10-CM | POA: Diagnosis not present

## 2017-10-07 DIAGNOSIS — E785 Hyperlipidemia, unspecified: Secondary | ICD-10-CM

## 2017-10-07 DIAGNOSIS — Z79811 Long term (current) use of aromatase inhibitors: Secondary | ICD-10-CM | POA: Diagnosis not present

## 2017-10-07 DIAGNOSIS — Z8679 Personal history of other diseases of the circulatory system: Secondary | ICD-10-CM

## 2017-10-07 DIAGNOSIS — D649 Anemia, unspecified: Secondary | ICD-10-CM

## 2017-10-07 DIAGNOSIS — J449 Chronic obstructive pulmonary disease, unspecified: Secondary | ICD-10-CM

## 2017-10-07 DIAGNOSIS — R630 Anorexia: Secondary | ICD-10-CM | POA: Diagnosis not present

## 2017-10-07 LAB — CBC WITH DIFFERENTIAL/PLATELET
Basophils Absolute: 0 10*3/uL (ref 0–0.1)
Basophils Relative: 0 %
Eosinophils Absolute: 0 10*3/uL (ref 0–0.7)
Eosinophils Relative: 0 %
HCT: 22.1 % — ABNORMAL LOW (ref 35.0–47.0)
Hemoglobin: 7.6 g/dL — ABNORMAL LOW (ref 12.0–16.0)
Lymphocytes Relative: 40 %
Lymphs Abs: 1 10*3/uL (ref 1.0–3.6)
MCH: 29.3 pg (ref 26.0–34.0)
MCHC: 34.2 g/dL (ref 32.0–36.0)
MCV: 85.7 fL (ref 80.0–100.0)
Monocytes Absolute: 0.1 10*3/uL — ABNORMAL LOW (ref 0.2–0.9)
Monocytes Relative: 4 %
Neutro Abs: 1.5 10*3/uL (ref 1.4–6.5)
Neutrophils Relative %: 56 %
Platelets: 37 10*3/uL — ABNORMAL LOW (ref 150–440)
RBC: 2.58 MIL/uL — AB (ref 3.80–5.20)
RDW: 13.8 % (ref 11.5–14.5)
WBC: 2.6 10*3/uL — ABNORMAL LOW (ref 3.6–11.0)

## 2017-10-07 LAB — PREPARE RBC (CROSSMATCH)

## 2017-10-07 LAB — SAMPLE TO BLOOD BANK

## 2017-10-07 MED ORDER — FUROSEMIDE 20 MG PO TABS: 20.0000 mg | ORAL_TABLET | Freq: Every day | ORAL | 0 refills | Status: DC

## 2017-10-07 NOTE — Progress Notes (Signed)
[place] OFFICE PROGRESS NOTE  Patient Care Team: [name], MD as PCP - General (Family Medicine) [name], DPM as Consulting Physician (Podiatry) [name], MD as Medical Oncologist (Hematology and Oncology) Bary Castilla Forest Gleason, MD (General Surgery)  Cancer Staging No matching staging information was found for the patient.   Oncology History   # April 2018- Left chest wall Bx- IMC; ER-PR-POS; her 2 Neu NEG; PET- mild hilar/distal adenopathy; ~1 cm right lung nodule/ several sub-centimeter.   # May 2018- cont [facility]; Abemacliclib [on HOLD sec to cytopenia]; MARCH 2019- PET near CR from breast cancer  # April 2017-isolated Thrombocytopenia- platelets- 113; worsening PANCYTOPENIA- April 2018- BMBx- no evidence of malignancy; MDS/Acute leuk; Karyotype/MDS- FISH panel-NEG [d/w Dr.Smir];   # June 2018- REPEAT BMBx/[facility] [June 2018-Dr.Foster; Aug 2018- Dr.Powell at [facility]]; No Diagnosis.   # April 11, 2017 [third bone marrow]-is still inconclusive; no obvious evidence of acute leukemia or obvious MDS except for mild dysplastic changes; and significantly reduced megakaryocytes. 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 [reviwed at [facility]; [facility]-NEG]  # April 1st 2019- Dacogen #1- severe pancytopenia  # July 2019-[ 4th BMBx-] shows variable cellularity between 10 to 70%; with scant erythroid and megakaryocytic precursors; left shifted myeloid lineage [likely from growth factor support]; no evidence of any blasts-no conclusive diagnosis noted.  No evidence of metastatic breast cancer noted.  -------------------------------------     # May 29th 2018- N-plate- suboptimal improvement; NOV 1st week start promacta 50 mg/day; suboptimal response; DEC 1st- start promacta 75 mg/day  # DEC 18th-INCREASE PROMACTA to 143m/day  # 2006- RIGHT BREAST CA [pT1cN0M0; STAGE  I; ER/PRPos; Her 2 Neu-NEG] s/p FEC x 6 [NSABP B-36]; Femara [Dec 2007-2012]  # Osteoporosis s/p Reclast; BMD- 2015-wnl- ca+vit D   Dr.Stewart [derm ? Squamous cell s/p freeze-oct 2018] --------------------------------------------------------------  DIAGNOSIS: [[name] ] # ? MDS /Meta.Breast cancer- ER/PR pos; her-NEG  STAGE:  IV  ;GOALS: palliative  CURRENT/MOST RECENT THERAPY: dacogen [April 2019]; Letrozole [May 2017];   # Pt needs PLATELETS- WASHED/ CROSS-MATCHED PLATELETS        Carcinoma of overlapping sites of right breast in female, estrogen receptor positive ([place]    MDS (myelodysplastic syndrome) ([place]      INTERVAL HISTORY:  [name]89y.o.  female pleasant patient above history of " bone marrow failure" of unclear etiology; history of metastatic breast cancer ER PR positive HER-2/neu negative on letrozole is here for follow-up.  In the interim patient was at the beach.  The trip was uneventful.  However patient has been tired; also had a blood counts checked while in the beach noted to have a platelet count of 8.  She returned to the clinic to have platelet transfusion last week.  Complains of fatigue.  Complains of shortness of breath with exertion.  Weight loss.  Complains of swelling in the legs.  Patient complains of anxiety/hyperactivity jitteriness/insomnia after steroids.  Wants to decrease the dose of steroids here  Review of Systems  Constitutional: Positive for malaise/fatigue and weight loss. Negative for chills, diaphoresis and fever.  HENT: Negative for nosebleeds and sore throat.   Eyes: Negative for double vision.  Respiratory: Positive for shortness of breath. Negative for cough, hemoptysis, sputum production and wheezing.   Cardiovascular: Positive for leg swelling. Negative for chest pain, palpitations and orthopnea.  Gastrointestinal: Negative for abdominal pain, blood in stool, constipation, diarrhea, heartburn,  melena, nausea and  vomiting.  Genitourinary: Negative for dysuria, frequency and urgency.  Musculoskeletal: Negative for back pain and joint pain.  Skin: Negative.  Negative for itching and rash.  Neurological: Negative for dizziness, tingling, focal weakness, weakness and headaches.  Endo/Heme/Allergies: Does not bruise/bleed easily.  Psychiatric/Behavioral: Negative for depression. The patient is not nervous/anxious and does not have insomnia.       PAST MEDICAL HISTORY :  Past Medical History:  Diagnosis Date  . Blood dyscrasia   . Breast cancer ([place])    right, lumpectomy, radiation, chemo  . Breast cancer ([place])    Right, 2007  . Breast cancer ([place]) 06/20/2016   INVASIVE DUCTAL CARCINOMA.  . Collapsed lung 02/14/2017   RIGHT  . Complication of anesthesia    PT STATED AFTER BIL HIP SURGERIES SHE STOPPED BREATHING THE NIGHT AFTER SURGERY  . COPD (chronic obstructive pulmonary disease) ([place])   . GERD (gastroesophageal reflux disease)   . Headache   . History of chemotherapy   . History of methicillin resistant staphylococcus aureus (MRSA) 2011  . History of radiation therapy   . Hyperlipidemia   . Hypertension   . Liver cancer ([place]) 05/2016  . Lung cancer ([place]) 05/2016  . Osteoarthritis    right hip  . Osteoporosis   . Squamous cell carcinoma    leg, Followed by Dr. Nicole Kindred    PAST SURGICAL HISTORY :   Past Surgical History:  Procedure Laterality Date  . ABDOMINAL HYSTERECTOMY    . BREAST BIOPSY Left 2002   left breast, calcifications  . BREAST BIOPSY Left 06/20/2016   INVASIVE DUCTAL CARCINOMA.  Marland Kitchen BREAST EXCISIONAL BIOPSY Right 2007   positive  . bunion repair    . ESOPHAGOGASTRODUODENOSCOPY (EGD) WITH PROPOFOL N/A 08/05/2016   Procedure: ESOPHAGOGASTRODUODENOSCOPY (EGD) WITH PROPOFOL;  Surgeon: San Jetty, MD;  Location: [facility] ENDOSCOPY;  Service: General;  Laterality: N/A;  . IR FLUORO GUIDE CV LINE RIGHT  07/25/2017  . JOINT REPLACEMENT    . left breast biopsy    . NASAL  SINUS SURGERY    . PORTACATH PLACEMENT Right 04/13/2017   Procedure: INSERTION PORT-A-CATH- RIGHT INTERNAL JUGULAR;  Surgeon: Robert Bellow, MD;  Location: [facility] ORS;  Service: General;  Laterality: Right;  . right hip replacement    . SHOULDER SURGERY    . SQUAMOUS CELL CARCINOMA EXCISION     right leg, Dr. Nicole Kindred  . TOTAL HIP ARTHROPLASTY     right    FAMILY HISTORY :   Family History  Problem Relation Age of Onset  . Heart disease Father 1  . Heart disease Mother   . Hyperlipidemia Mother   . Heart disease Brother   . Diabetes Brother   . Pancreatic cancer Brother   . Diabetes Maternal Grandmother   . Breast cancer Cousin   . Kidney disease Brother     SOCIAL HISTORY:   Social History   Tobacco Use  . Smoking status: Current Some Day Smoker    Packs/day: 0.25    Years: 30.00    Pack years: 7.50    Types: Cigarettes  . Smokeless tobacco: Never Used  Substance Use Topics  . Alcohol use: Not Currently    Alcohol/week: 0.0 oz    Comment: socially  . Drug use: No    ALLERGIES:  is allergic to codeine; fish-derived products; morphine sulfate; adhesive [tape]; and oxycodone.  MEDICATIONS:  Current Outpatient Medications  Medication Sig Dispense Refill  . acyclovir (ZOVIRAX) 400 MG tablet  Take 1 tablet (400 mg total) by mouth 2 (two) times daily. 60 tablet 3  . ALPRAZolam (XANAX) 0.5 MG tablet Take 1 tablet (0.5 mg total) by mouth every 8 (eight) hours as needed for anxiety. 30 tablet 2  . cyanocobalamin (,VITAMIN B-12,) 1000 MCG/ML injection Inject 1,000 mcg into the muscle every 30 (thirty) days.    . fexofenadine (ALLEGRA) 180 MG tablet Take 180 mg by mouth every morning.     . fluconazole (DIFLUCAN) 200 MG tablet Take 1 tablet (200 mg total) by mouth daily. 30 tablet 3  . Glucosamine-Chondroitin (COSAMIN DS PO) Take 1 tablet by mouth daily. In the morning.    Marland Kitchen letrozole (FEMARA) 2.5 MG tablet TAKE 1 TABLET (2.5 MG TOTAL) BY MOUTH DAILY. ONCE A DAY. 90 tablet 0   . levofloxacin (LEVAQUIN) 500 MG tablet Take 500 mg by mouth daily.    . metoprolol succinate (TOPROL-XL) 25 MG 24 hr tablet Take 1 tablet (25 mg total) by mouth daily. 90 tablet 3  . pravastatin (PRAVACHOL) 40 MG tablet Take 1 tablet (40 mg total) by mouth daily. 90 tablet 3  . Probiotic Product (PROBIOTIC & ACIDOPHILUS EX ST PO) Take 1 tablet by mouth daily.     . prochlorperazine (COMPAZINE) 10 MG tablet Take 1 tablet (10 mg total) by mouth every 6 (six) hours as needed for nausea or vomiting. 30 tablet 2  . ranitidine (ZANTAC) 75 MG tablet Take 75 mg by mouth daily as needed for heartburn.     . Vitamin D, Ergocalciferol, (DRISDOL) 50000 units CAPS capsule TAKE 1 CAPSULE (50,000 UNITS TOTAL) BY MOUTH EVERY SATURDAY. IN THE MORNING. 12 capsule 0  . furosemide (LASIX) 20 MG tablet Take 1 tablet (20 mg total) by mouth daily. 14 tablet 0   No current facility-administered medications for this visit.    Facility-Administered Medications Ordered in Other Visits  Medication Dose Route Frequency Provider Last Rate Last Dose  . 0.9 %  sodium chloride infusion   Intravenous Continuous [name], MD   Stopped at 06/28/17 1450  . 0.9 %  sodium chloride infusion   Intravenous Continuous [name], MD   Stopped at 07/29/17 1515  . heparin lock flush 100 unit/mL  500 Units Intravenous Once Charlaine Dalton R, MD      . sodium chloride flush (NS) 0.9 % injection 10 mL  10 mL Intravenous PRN Charlaine Dalton R, MD      . sodium chloride flush (NS) 0.9 % injection 10 mL  10 mL Intravenous PRN Verlon Au, NP      . sodium chloride flush (NS) 0.9 % injection 10 mL  10 mL Intravenous PRN [name], MD   10 mL at 07/29/17 1405    PHYSICAL EXAMINATION: ECOG PERFORMANCE STATUS: 2 - Symptomatic, <50% confined to bed  BP 115/69 (BP Location: Left Arm, Patient Position: Sitting)   Pulse (!) 121   Temp 98.1 F (36.7 C) (Tympanic)   Resp 16   Wt 129 lb (58.5 kg)    BMI 24.37 kg/m   Filed Weights   10/07/17 1209  Weight: 129 lb (58.5 kg)    GENERAL: Well-nourished well-developed; Alert, no distress and comfortable. Accompanied by family.  EYES: Positive for pallor no icterus OROPHARYNX: no thrush or ulceration; NECK: supple; no lymph nodes felt. LYMPH:  no palpable lymphadenopathy in the axillary or inguinal regions LUNGS: Decreased breath sounds auscultation bilaterally. No wheeze or crackles HEART/CVS: regular rate & rhythm and  no murmurs; 2+ bilateral lower extremity edema ABDOMEN:abdomen soft, non-tender and normal bowel sounds. No hepatomegaly or splenomegaly.  Musculoskeletal:no cyanosis of digits and no clubbing  PSYCH: alert & oriented x 3 with fluent speech NEURO: no focal motor/sensory deficits SKIN:  no rashes or significant lesions    LABORATORY DATA:  I have reviewed the data as listed    Component Value Date/Time   NA 138 09/27/2017 1308   K 4.0 09/27/2017 1308   CL 102 09/27/2017 1308   CO2 25 09/27/2017 1308   GLUCOSE 163 (H) 09/27/2017 1308   BUN 18 09/27/2017 1308   CREATININE 0.88 09/27/2017 1308   CREATININE 0.67 07/09/2014 1321   CREATININE 0.70 04/30/2014 1455   CALCIUM 9.4 09/27/2017 1308   PROT 5.8 (L) 08/05/2017 1052   PROT 7.3 07/09/2014 1321   ALBUMIN 2.4 (L) 08/05/2017 1052   ALBUMIN 4.3 07/09/2014 1321   AST 21 08/05/2017 1052   AST 19 07/09/2014 1321   ALT 24 08/05/2017 1052   ALT 18 07/09/2014 1321   ALKPHOS 51 08/05/2017 1052   ALKPHOS 66 07/09/2014 1321   BILITOT 0.6 08/05/2017 1052   BILITOT 0.6 07/09/2014 1321   GFRNONAA >60 09/27/2017 1308   GFRNONAA >60 07/09/2014 1321   GFRAA >60 09/27/2017 1308   GFRAA >60 07/09/2014 1321    No results found for: SPEP, UPEP  Lab Results  Component Value Date   WBC 2.6 (L) 10/07/2017   NEUTROABS 1.5 10/07/2017   HGB 7.6 (L) 10/07/2017   HCT 22.1 (L) 10/07/2017   MCV 85.7 10/07/2017   PLT 37 (L) 10/07/2017      Chemistry      Component  Value Date/Time   NA 138 09/27/2017 1308   K 4.0 09/27/2017 1308   CL 102 09/27/2017 1308   CO2 25 09/27/2017 1308   BUN 18 09/27/2017 1308   CREATININE 0.88 09/27/2017 1308   CREATININE 0.67 07/09/2014 1321   CREATININE 0.70 04/30/2014 1455      Component Value Date/Time   CALCIUM 9.4 09/27/2017 1308   ALKPHOS 51 08/05/2017 1052   ALKPHOS 66 07/09/2014 1321   AST 21 08/05/2017 1052   AST 19 07/09/2014 1321   ALT 24 08/05/2017 1052   ALT 18 07/09/2014 1321   BILITOT 0.6 08/05/2017 1052   BILITOT 0.6 07/09/2014 1321       RADIOGRAPHIC STUDIES: I have personally reviewed the radiological images as listed and agreed with the findings in the report. No results found.   ASSESSMENT & PLAN:  MDS (myelodysplastic syndrome) ([place]) # Highly suspicious for MDS-high-grade vs "bone marrow failure"-of unclear etiology currently status post Dacogen [April 1st 2019].  Dacogen on hold.  Worsening anemia thrombocytopenia neutropenia.  #Given multiple bone marrow biopsies with without any clear diagnostic value.  Given the difficult situation discussed with the patient regarding "' fourth opinion".  Discussed regarding [facility]; however patient wants to hold off for of travel.  Wants to be evaluated at [facility]; Dr. Freddrick March.  I think it is reasonable.  We will make a r referral  #Severe anemia/severe thrombocytopenia/severe neutropenia-continue supportive therapy with PRBC once a week; crossmatch platelets 1-2 times a week; Granix as needed.  Today hemoglobin is 7.4 platelets 34; ANC- 1.4.  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 On Femara.;  PET scan April 2019-stable disease no evidence of any active breast cancer.  Clinically stable.  We will plan to get repeat imaging in approximately 1 to 2  months.  #Intolerance to steroids we will plan to decrease the dose of steroids prior to platelet transfusion -solumedrol 20 next time  #Bilateral  swelling/dependent edema- Lasix 43m/day; dietary Potassium.   #We will follow-up with me in 3 weeks.  Monday Thursday labs possible transfusion the following day.  Granix as needed if ANC less than 1500.      Orders Placed This Encounter  Procedures  . Ambulatory referral to Hematology / Oncology    Referral Priority:   Urgent    Referral Type:   Consultation    Referral Reason:   Specialty Services Required    Referred to Provider:   dBryna Colander MD    Requested Specialty:   Internal Medicine    Number of Visits Requested:   1   All questions were answered. The patient knows to call the clinic with any problems, questions or concerns.      GCammie Sickle MD 10/08/2017 10:48 PM

## 2017-10-07 NOTE — Assessment & Plan Note (Addendum)
#  Highly suspicious for MDS-high-grade vs "bone marrow failure"-of unclear etiology currently status post Dacogen [April 1st 2019].  Dacogen on hold.  Worsening anemia thrombocytopenia neutropenia.  #Given multiple bone marrow biopsies with without any clear diagnostic value.  Given the difficult situation discussed with the patient regarding "' fourth opinion".  Discussed regarding [facility]; however patient wants to hold off for of travel.  Wants to be evaluated at [facility]; Dr. Freddrick March.  I think it is reasonable.  We will make a r referral  #Severe anemia/severe thrombocytopenia/severe neutropenia-continue supportive therapy with PRBC once a week; crossmatch platelets 1-2 times a week; Granix as needed.  Today hemoglobin is 7.4 platelets 34; ANC- 1.4.  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 On Femara.;  PET scan April 2019-stable disease no evidence of any active breast cancer.  Clinically stable.  We will plan to get repeat imaging in approximately 1 to 2 months.  #Intolerance to steroids we will plan to decrease the dose of steroids prior to platelet transfusion -solumedrol 20 next time  #Bilateral swelling/dependent edema- Lasix '20mg'$ /day; dietary Potassium.   #We will follow-up with me in 3 weeks.  Monday Thursday labs possible transfusion the following day.  Granix as needed if ANC less than 1500.

## 2017-10-08 ENCOUNTER — Inpatient Hospital Stay: Payer: Medicare Other

## 2017-10-08 DIAGNOSIS — Z8679 Personal history of other diseases of the circulatory system: Secondary | ICD-10-CM | POA: Diagnosis not present

## 2017-10-08 DIAGNOSIS — C50811 Malignant neoplasm of overlapping sites of right female breast: Secondary | ICD-10-CM | POA: Diagnosis not present

## 2017-10-08 DIAGNOSIS — D469 Myelodysplastic syndrome, unspecified: Secondary | ICD-10-CM | POA: Diagnosis not present

## 2017-10-08 DIAGNOSIS — Z170 Estrogen receptor positive status [ER+]: Secondary | ICD-10-CM | POA: Diagnosis not present

## 2017-10-08 DIAGNOSIS — D649 Anemia, unspecified: Secondary | ICD-10-CM

## 2017-10-08 DIAGNOSIS — Z79811 Long term (current) use of aromatase inhibitors: Secondary | ICD-10-CM | POA: Diagnosis not present

## 2017-10-08 DIAGNOSIS — D61818 Other pancytopenia: Secondary | ICD-10-CM | POA: Diagnosis not present

## 2017-10-08 MED ORDER — DIPHENHYDRAMINE HCL 25 MG PO CAPS
25.0000 mg | ORAL_CAPSULE | Freq: Once | ORAL | Status: AC
  Administered 2017-10-08: 25 mg via ORAL
  Filled 2017-10-08: qty 1

## 2017-10-08 MED ORDER — HEPARIN SOD (PORK) LOCK FLUSH 100 UNIT/ML IV SOLN
500.0000 [IU] | Freq: Every day | INTRAVENOUS | Status: AC | PRN
  Administered 2017-10-08: 500 [IU]
  Filled 2017-10-08: qty 5

## 2017-10-08 MED ORDER — SODIUM CHLORIDE 0.9% IV SOLUTION
250.0000 mL | Freq: Once | INTRAVENOUS | Status: AC
  Administered 2017-10-08: 250 mL via INTRAVENOUS
  Filled 2017-10-08: qty 250

## 2017-10-08 MED ORDER — ACETAMINOPHEN 325 MG PO TABS
650.0000 mg | ORAL_TABLET | Freq: Once | ORAL | Status: AC
  Administered 2017-10-08: 650 mg via ORAL
  Filled 2017-10-08: qty 2

## 2017-10-08 MED ORDER — SODIUM CHLORIDE 0.9% FLUSH
10.0000 mL | INTRAVENOUS | Status: AC | PRN
  Administered 2017-10-08: 10 mL
  Filled 2017-10-08: qty 10

## 2017-10-09 ENCOUNTER — Encounter: Payer: Self-pay | Admitting: Internal Medicine

## 2017-10-09 LAB — TYPE AND SCREEN
ABO/RH(D): O POS
Antibody Screen: POSITIVE
DONOR AG TYPE: NEGATIVE
Unit division: 0

## 2017-10-09 LAB — BPAM RBC
Blood Product Expiration Date: 201908292359
ISSUE DATE / TIME: 201907301345
Unit Type and Rh: 5100

## 2017-10-10 ENCOUNTER — Other Ambulatory Visit: Payer: Self-pay | Admitting: *Deleted

## 2017-10-10 ENCOUNTER — Inpatient Hospital Stay: Payer: Medicare Other | Attending: Internal Medicine

## 2017-10-10 ENCOUNTER — Telehealth: Payer: Self-pay | Admitting: *Deleted

## 2017-10-10 DIAGNOSIS — D696 Thrombocytopenia, unspecified: Secondary | ICD-10-CM | POA: Diagnosis not present

## 2017-10-10 DIAGNOSIS — L03115 Cellulitis of right lower limb: Secondary | ICD-10-CM | POA: Diagnosis not present

## 2017-10-10 DIAGNOSIS — Z803 Family history of malignant neoplasm of breast: Secondary | ICD-10-CM | POA: Insufficient documentation

## 2017-10-10 DIAGNOSIS — Z7689 Persons encountering health services in other specified circumstances: Secondary | ICD-10-CM | POA: Diagnosis not present

## 2017-10-10 DIAGNOSIS — E785 Hyperlipidemia, unspecified: Secondary | ICD-10-CM | POA: Insufficient documentation

## 2017-10-10 DIAGNOSIS — M810 Age-related osteoporosis without current pathological fracture: Secondary | ICD-10-CM | POA: Diagnosis not present

## 2017-10-10 DIAGNOSIS — N6322 Unspecified lump in the left breast, upper inner quadrant: Secondary | ICD-10-CM

## 2017-10-10 DIAGNOSIS — J449 Chronic obstructive pulmonary disease, unspecified: Secondary | ICD-10-CM | POA: Diagnosis not present

## 2017-10-10 DIAGNOSIS — D649 Anemia, unspecified: Secondary | ICD-10-CM | POA: Diagnosis not present

## 2017-10-10 DIAGNOSIS — I10 Essential (primary) hypertension: Secondary | ICD-10-CM | POA: Diagnosis not present

## 2017-10-10 DIAGNOSIS — K219 Gastro-esophageal reflux disease without esophagitis: Secondary | ICD-10-CM | POA: Diagnosis not present

## 2017-10-10 DIAGNOSIS — D709 Neutropenia, unspecified: Secondary | ICD-10-CM | POA: Diagnosis not present

## 2017-10-10 DIAGNOSIS — Z79899 Other long term (current) drug therapy: Secondary | ICD-10-CM | POA: Insufficient documentation

## 2017-10-10 DIAGNOSIS — Z170 Estrogen receptor positive status [ER+]: Secondary | ICD-10-CM | POA: Diagnosis not present

## 2017-10-10 DIAGNOSIS — Z79811 Long term (current) use of aromatase inhibitors: Secondary | ICD-10-CM | POA: Diagnosis not present

## 2017-10-10 DIAGNOSIS — Z9221 Personal history of antineoplastic chemotherapy: Secondary | ICD-10-CM | POA: Diagnosis not present

## 2017-10-10 DIAGNOSIS — Z923 Personal history of irradiation: Secondary | ICD-10-CM | POA: Insufficient documentation

## 2017-10-10 DIAGNOSIS — Z800 Family history of malignant neoplasm of digestive organs: Secondary | ICD-10-CM | POA: Insufficient documentation

## 2017-10-10 DIAGNOSIS — Z85828 Personal history of other malignant neoplasm of skin: Secondary | ICD-10-CM | POA: Diagnosis not present

## 2017-10-10 DIAGNOSIS — D469 Myelodysplastic syndrome, unspecified: Secondary | ICD-10-CM | POA: Insufficient documentation

## 2017-10-10 DIAGNOSIS — C50811 Malignant neoplasm of overlapping sites of right female breast: Secondary | ICD-10-CM | POA: Diagnosis not present

## 2017-10-10 DIAGNOSIS — F17210 Nicotine dependence, cigarettes, uncomplicated: Secondary | ICD-10-CM | POA: Insufficient documentation

## 2017-10-10 LAB — CBC WITH DIFFERENTIAL/PLATELET
BASOS ABS: 0 10*3/uL (ref 0–0.1)
BASOS PCT: 0 %
Eosinophils Absolute: 0 10*3/uL (ref 0–0.7)
Eosinophils Relative: 0 %
HEMATOCRIT: 28.7 % — AB (ref 35.0–47.0)
HEMOGLOBIN: 10.1 g/dL — AB (ref 12.0–16.0)
Lymphocytes Relative: 47 %
Lymphs Abs: 1 10*3/uL (ref 1.0–3.6)
MCH: 30.4 pg (ref 26.0–34.0)
MCHC: 35.3 g/dL (ref 32.0–36.0)
MCV: 86 fL (ref 80.0–100.0)
MONOS PCT: 7 %
Monocytes Absolute: 0.1 10*3/uL — ABNORMAL LOW (ref 0.2–0.9)
NEUTROS ABS: 1 10*3/uL — AB (ref 1.4–6.5)
NEUTROS PCT: 46 %
Platelets: 12 10*3/uL — CL (ref 150–400)
RBC: 3.33 MIL/uL — AB (ref 3.80–5.20)
RDW: 13.1 % (ref 11.5–14.5)
WBC: 2.1 10*3/uL — AB (ref 3.6–11.0)

## 2017-10-10 LAB — SAMPLE TO BLOOD BANK

## 2017-10-10 NOTE — Telephone Encounter (Signed)
Critical plts count of 12 - called by [facility] in c.ctr lab at 11:34 10/10/2017.  Read back process performed. Dr. Rogue Bussing informed at 1140. Read back process performed with md.

## 2017-10-11 ENCOUNTER — Inpatient Hospital Stay: Payer: Medicare Other

## 2017-10-11 VITALS — BP 116/72 | Temp 97.0°F | Resp 18

## 2017-10-11 DIAGNOSIS — Z170 Estrogen receptor positive status [ER+]: Secondary | ICD-10-CM

## 2017-10-11 DIAGNOSIS — D696 Thrombocytopenia, unspecified: Secondary | ICD-10-CM | POA: Diagnosis not present

## 2017-10-11 DIAGNOSIS — D649 Anemia, unspecified: Secondary | ICD-10-CM | POA: Diagnosis not present

## 2017-10-11 DIAGNOSIS — C50811 Malignant neoplasm of overlapping sites of right female breast: Secondary | ICD-10-CM

## 2017-10-11 DIAGNOSIS — Z7689 Persons encountering health services in other specified circumstances: Secondary | ICD-10-CM | POA: Diagnosis not present

## 2017-10-11 DIAGNOSIS — D693 Immune thrombocytopenic purpura: Secondary | ICD-10-CM

## 2017-10-11 DIAGNOSIS — Z79899 Other long term (current) drug therapy: Secondary | ICD-10-CM | POA: Diagnosis not present

## 2017-10-11 DIAGNOSIS — D469 Myelodysplastic syndrome, unspecified: Secondary | ICD-10-CM | POA: Diagnosis not present

## 2017-10-11 DIAGNOSIS — L03115 Cellulitis of right lower limb: Secondary | ICD-10-CM | POA: Diagnosis not present

## 2017-10-11 MED ORDER — SODIUM CHLORIDE 0.9% IV SOLUTION
250.0000 mL | Freq: Once | INTRAVENOUS | Status: AC
  Administered 2017-10-11: 250 mL via INTRAVENOUS
  Filled 2017-10-11: qty 250

## 2017-10-11 MED ORDER — METHYLPREDNISOLONE SODIUM SUCC 125 MG IJ SOLR: 40.0000 mg | Freq: Once | INTRAMUSCULAR | Status: DC

## 2017-10-11 MED ORDER — METHYLPREDNISOLONE SODIUM SUCC 125 MG IJ SOLR
20.0000 mg | Freq: Once | INTRAMUSCULAR | Status: AC
  Administered 2017-10-11: 20 mg via INTRAVENOUS

## 2017-10-11 MED ORDER — HEPARIN SOD (PORK) LOCK FLUSH 100 UNIT/ML IV SOLN
500.0000 [IU] | Freq: Every day | INTRAVENOUS | Status: AC | PRN
  Administered 2017-10-11: 500 [IU]
  Filled 2017-10-11: qty 5

## 2017-10-11 MED ORDER — METHYLPREDNISOLONE SODIUM SUCC 125 MG IJ SOLR
INTRAMUSCULAR | Status: AC
  Filled 2017-10-11: qty 2

## 2017-10-11 MED ORDER — TBO-FILGRASTIM 480 MCG/0.8ML ~~LOC~~ SOSY
480.0000 ug | PREFILLED_SYRINGE | Freq: Once | SUBCUTANEOUS | Status: AC
  Administered 2017-10-11: 480 ug via SUBCUTANEOUS

## 2017-10-11 MED ORDER — ACETAMINOPHEN 325 MG PO TABS
650.0000 mg | ORAL_TABLET | Freq: Once | ORAL | Status: AC
  Administered 2017-10-11: 650 mg via ORAL
  Filled 2017-10-11: qty 2

## 2017-10-11 MED ORDER — METHYLPREDNISOLONE SODIUM SUCC 40 MG IJ SOLR
20.0000 mg | Freq: Once | INTRAMUSCULAR | Status: DC
  Filled 2017-10-11: qty 1

## 2017-10-11 MED ORDER — SODIUM CHLORIDE 0.9% FLUSH
10.0000 mL | INTRAVENOUS | Status: DC | PRN
  Filled 2017-10-11: qty 10

## 2017-10-11 MED ORDER — DIPHENHYDRAMINE HCL 25 MG PO CAPS
25.0000 mg | ORAL_CAPSULE | Freq: Once | ORAL | Status: AC
  Administered 2017-10-11: 25 mg via ORAL
  Filled 2017-10-11: qty 1

## 2017-10-11 NOTE — Progress Notes (Signed)
Per Dr. Rogue Bussing okay to give 20 mg Solu-Medrol instead of 40mg  per pt request.

## 2017-10-12 LAB — PREPARE PLATELET PHERESIS: Unit division: 0

## 2017-10-12 LAB — BPAM PLATELET PHERESIS
Blood Product Expiration Date: 201908021435
ISSUE DATE / TIME: 201908021407
Unit Type and Rh: 5100

## 2017-10-13 ENCOUNTER — Other Ambulatory Visit: Payer: Self-pay | Admitting: Internal Medicine

## 2017-10-14 ENCOUNTER — Inpatient Hospital Stay: Payer: Medicare Other

## 2017-10-14 ENCOUNTER — Other Ambulatory Visit: Payer: Self-pay

## 2017-10-14 DIAGNOSIS — D469 Myelodysplastic syndrome, unspecified: Secondary | ICD-10-CM

## 2017-10-14 LAB — SAMPLE TO BLOOD BANK

## 2017-10-14 LAB — CBC WITH DIFFERENTIAL/PLATELET
BASOS PCT: 0 %
Basophils Absolute: 0 10*3/uL (ref 0–0.1)
Eosinophils Absolute: 0 10*3/uL (ref 0–0.7)
Eosinophils Relative: 0 %
HEMATOCRIT: 25.3 % — AB (ref 35.0–47.0)
HEMOGLOBIN: 8.8 g/dL — AB (ref 12.0–16.0)
LYMPHS ABS: 1.2 10*3/uL (ref 1.0–3.6)
Lymphocytes Relative: 36 %
MCH: 29.9 pg (ref 26.0–34.0)
MCHC: 34.8 g/dL (ref 32.0–36.0)
MCV: 86 fL (ref 80.0–100.0)
MONOS PCT: 4 %
Monocytes Absolute: 0.1 10*3/uL — ABNORMAL LOW (ref 0.2–0.9)
NEUTROS ABS: 1.9 10*3/uL (ref 1.4–6.5)
NEUTROS PCT: 60 %
Platelets: 30 10*3/uL — ABNORMAL LOW (ref 150–440)
RBC: 2.94 MIL/uL — AB (ref 3.80–5.20)
RDW: 13.7 % (ref 11.5–14.5)
WBC: 3.2 10*3/uL — AB (ref 3.6–11.0)

## 2017-10-15 ENCOUNTER — Inpatient Hospital Stay: Payer: Medicare Other

## 2017-10-15 ENCOUNTER — Ambulatory Visit: Payer: Medicare Other

## 2017-10-17 ENCOUNTER — Inpatient Hospital Stay: Payer: Medicare Other

## 2017-10-17 ENCOUNTER — Inpatient Hospital Stay
Admission: AD | Admit: 2017-10-17 | Discharge: 2017-10-19 | DRG: 603 | Disposition: A | Discharge status: Home / Self Care | Payer: Medicare Other | Source: Ambulatory Visit | Attending: Internal Medicine | Admitting: Internal Medicine

## 2017-10-17 ENCOUNTER — Other Ambulatory Visit: Payer: Self-pay | Admitting: *Deleted

## 2017-10-17 ENCOUNTER — Inpatient Hospital Stay (HOSPITAL_BASED_OUTPATIENT_CLINIC_OR_DEPARTMENT_OTHER): Payer: Medicare Other | Admitting: Nurse Practitioner

## 2017-10-17 ENCOUNTER — Encounter: Payer: Self-pay | Admitting: Nurse Practitioner

## 2017-10-17 ENCOUNTER — Other Ambulatory Visit: Payer: Self-pay

## 2017-10-17 VITALS — BP 98/64 | HR 102 | Resp 18

## 2017-10-17 VITALS — BP 93/59 | HR 108 | Temp 97.6°F | Resp 18 | Ht 61.0 in | Wt 127.0 lb

## 2017-10-17 DIAGNOSIS — Z803 Family history of malignant neoplasm of breast: Secondary | ICD-10-CM | POA: Diagnosis not present

## 2017-10-17 DIAGNOSIS — L03115 Cellulitis of right lower limb: Secondary | ICD-10-CM | POA: Diagnosis present

## 2017-10-17 DIAGNOSIS — R600 Localized edema: Secondary | ICD-10-CM | POA: Diagnosis present

## 2017-10-17 DIAGNOSIS — Z7689 Persons encountering health services in other specified circumstances: Secondary | ICD-10-CM

## 2017-10-17 DIAGNOSIS — Z923 Personal history of irradiation: Secondary | ICD-10-CM

## 2017-10-17 DIAGNOSIS — L0390 Cellulitis, unspecified: Secondary | ICD-10-CM | POA: Diagnosis not present

## 2017-10-17 DIAGNOSIS — Z8249 Family history of ischemic heart disease and other diseases of the circulatory system: Secondary | ICD-10-CM

## 2017-10-17 DIAGNOSIS — Z8349 Family history of other endocrine, nutritional and metabolic diseases: Secondary | ICD-10-CM

## 2017-10-17 DIAGNOSIS — D469 Myelodysplastic syndrome, unspecified: Secondary | ICD-10-CM

## 2017-10-17 DIAGNOSIS — C50919 Malignant neoplasm of unspecified site of unspecified female breast: Secondary | ICD-10-CM | POA: Diagnosis not present

## 2017-10-17 DIAGNOSIS — D696 Thrombocytopenia, unspecified: Secondary | ICD-10-CM

## 2017-10-17 DIAGNOSIS — F419 Anxiety disorder, unspecified: Secondary | ICD-10-CM | POA: Diagnosis present

## 2017-10-17 DIAGNOSIS — R9431 Abnormal electrocardiogram [ECG] [EKG]: Secondary | ICD-10-CM | POA: Diagnosis not present

## 2017-10-17 DIAGNOSIS — J449 Chronic obstructive pulmonary disease, unspecified: Secondary | ICD-10-CM | POA: Diagnosis present

## 2017-10-17 DIAGNOSIS — Z170 Estrogen receptor positive status [ER+]: Secondary | ICD-10-CM | POA: Diagnosis not present

## 2017-10-17 DIAGNOSIS — Z833 Family history of diabetes mellitus: Secondary | ICD-10-CM | POA: Diagnosis not present

## 2017-10-17 DIAGNOSIS — E785 Hyperlipidemia, unspecified: Secondary | ICD-10-CM

## 2017-10-17 DIAGNOSIS — D649 Anemia, unspecified: Secondary | ICD-10-CM

## 2017-10-17 DIAGNOSIS — Z885 Allergy status to narcotic agent status: Secondary | ICD-10-CM | POA: Diagnosis not present

## 2017-10-17 DIAGNOSIS — D709 Neutropenia, unspecified: Secondary | ICD-10-CM | POA: Diagnosis not present

## 2017-10-17 DIAGNOSIS — F17210 Nicotine dependence, cigarettes, uncomplicated: Secondary | ICD-10-CM

## 2017-10-17 DIAGNOSIS — Z9221 Personal history of antineoplastic chemotherapy: Secondary | ICD-10-CM | POA: Diagnosis not present

## 2017-10-17 DIAGNOSIS — D61818 Other pancytopenia: Secondary | ICD-10-CM | POA: Diagnosis present

## 2017-10-17 DIAGNOSIS — K219 Gastro-esophageal reflux disease without esophagitis: Secondary | ICD-10-CM | POA: Diagnosis present

## 2017-10-17 DIAGNOSIS — M810 Age-related osteoporosis without current pathological fracture: Secondary | ICD-10-CM | POA: Diagnosis not present

## 2017-10-17 DIAGNOSIS — Z79899 Other long term (current) drug therapy: Secondary | ICD-10-CM

## 2017-10-17 DIAGNOSIS — C799 Secondary malignant neoplasm of unspecified site: Secondary | ICD-10-CM | POA: Diagnosis present

## 2017-10-17 DIAGNOSIS — Z8614 Personal history of Methicillin resistant Staphylococcus aureus infection: Secondary | ICD-10-CM | POA: Diagnosis not present

## 2017-10-17 DIAGNOSIS — Z800 Family history of malignant neoplasm of digestive organs: Secondary | ICD-10-CM

## 2017-10-17 DIAGNOSIS — Z841 Family history of disorders of kidney and ureter: Secondary | ICD-10-CM | POA: Diagnosis not present

## 2017-10-17 DIAGNOSIS — C50912 Malignant neoplasm of unspecified site of left female breast: Secondary | ICD-10-CM | POA: Diagnosis present

## 2017-10-17 DIAGNOSIS — Z79811 Long term (current) use of aromatase inhibitors: Secondary | ICD-10-CM | POA: Diagnosis not present

## 2017-10-17 DIAGNOSIS — L02415 Cutaneous abscess of right lower limb: Secondary | ICD-10-CM

## 2017-10-17 DIAGNOSIS — C50811 Malignant neoplasm of overlapping sites of right female breast: Secondary | ICD-10-CM

## 2017-10-17 DIAGNOSIS — I82409 Acute embolism and thrombosis of unspecified deep veins of unspecified lower extremity: Secondary | ICD-10-CM

## 2017-10-17 DIAGNOSIS — D693 Immune thrombocytopenic purpura: Secondary | ICD-10-CM

## 2017-10-17 DIAGNOSIS — Z792 Long term (current) use of antibiotics: Secondary | ICD-10-CM

## 2017-10-17 DIAGNOSIS — I10 Essential (primary) hypertension: Secondary | ICD-10-CM | POA: Diagnosis present

## 2017-10-17 DIAGNOSIS — Z452 Encounter for adjustment and management of vascular access device: Secondary | ICD-10-CM

## 2017-10-17 DIAGNOSIS — M25561 Pain in right knee: Secondary | ICD-10-CM

## 2017-10-17 DIAGNOSIS — Z85828 Personal history of other malignant neoplasm of skin: Secondary | ICD-10-CM

## 2017-10-17 LAB — COMPREHENSIVE METABOLIC PANEL
ALT: 41 U/L (ref 0–44)
AST: 37 U/L (ref 15–41)
Albumin: 2.7 g/dL — ABNORMAL LOW (ref 3.5–5.0)
Alkaline Phosphatase: 97 U/L (ref 38–126)
Anion gap: 7 (ref 5–15)
BILIRUBIN TOTAL: 0.3 mg/dL (ref 0.3–1.2)
BUN: 19 mg/dL (ref 8–23)
CO2: 31 mmol/L (ref 22–32)
CREATININE: 0.85 mg/dL (ref 0.44–1.00)
Calcium: 8.6 mg/dL — ABNORMAL LOW (ref 8.9–10.3)
Chloride: 100 mmol/L (ref 98–111)
GFR calc Af Amer: >60 mL/min (ref >60)
GLUCOSE: 134 mg/dL — AB (ref 70–99)
Potassium: 3.6 mmol/L (ref 3.5–5.1)
Sodium: 138 mmol/L (ref 135–145)
TOTAL PROTEIN: 5.3 g/dL — AB (ref 6.5–8.1)

## 2017-10-17 LAB — CBC WITH DIFFERENTIAL/PLATELET
BASOS ABS: 0 10*3/uL (ref 0–0.1)
Basophils Absolute: 0 10*3/uL (ref 0–0.1)
Basophils Relative: 0 %
Basophils Relative: 0 %
EOS ABS: 0 10*3/uL (ref 0–0.7)
EOS PCT: 0 %
EOS PCT: 1 %
Eosinophils Absolute: 0 10*3/uL (ref 0–0.7)
HCT: 20 % — ABNORMAL LOW (ref 35.0–47.0)
HEMATOCRIT: 21.5 % — AB (ref 35.0–47.0)
Hemoglobin: 7.4 g/dL — ABNORMAL LOW (ref 12.0–16.0)
Hemoglobin: 7.2 g/dL — ABNORMAL LOW (ref 12.0–16.0)
LYMPHS PCT: 54 %
Lymphocytes Relative: 40 %
Lymphs Abs: 0.9 10*3/uL — ABNORMAL LOW (ref 1.0–3.6)
Lymphs Abs: 1 10*3/uL (ref 1.0–3.6)
MCH: 29.8 pg (ref 26.0–34.0)
MCH: 30.7 pg (ref 26.0–34.0)
MCHC: 34.5 g/dL (ref 32.0–36.0)
MCHC: 36.2 g/dL — ABNORMAL HIGH (ref 32.0–36.0)
MCV: 86.3 fL (ref 80.0–100.0)
MCV: 84.9 fL (ref 80.0–100.0)
MONO ABS: 0.1 10*3/uL — AB (ref 0.2–0.9)
MONO ABS: 0.1 10*3/uL — AB (ref 0.2–0.9)
MONOS PCT: 5 %
Monocytes Relative: 3 %
NEUTROS ABS: 0.7 10*3/uL — AB (ref 1.4–6.5)
Neutro Abs: 1.3 10*3/uL — ABNORMAL LOW (ref 1.4–6.5)
Neutrophils Relative %: 41 %
Neutrophils Relative %: 56 %
PLATELETS: 11 10*3/uL — AB (ref 150–400)
PLATELETS: 8 10*3/uL — AB (ref 150–440)
RBC: 2.49 MIL/uL — ABNORMAL LOW (ref 3.80–5.20)
RBC: 2.36 MIL/uL — AB (ref 3.80–5.20)
RDW: 13 % (ref 11.5–14.5)
RDW: 13.2 % (ref 11.5–14.5)
WBC: 1.7 10*3/uL — ABNORMAL LOW (ref 3.6–11.0)
WBC: 2.4 10*3/uL — AB (ref 3.6–11.0)

## 2017-10-17 LAB — BASIC METABOLIC PANEL
Anion gap: 12 (ref 5–15)
BUN: 18 mg/dL (ref 8–23)
CALCIUM: 9.1 mg/dL (ref 8.9–10.3)
CO2: 26 mmol/L (ref 22–32)
CREATININE: 0.87 mg/dL (ref 0.44–1.00)
Chloride: 98 mmol/L (ref 98–111)
GFR calc Af Amer: >60 mL/min (ref >60)
Glucose, Bld: 125 mg/dL — ABNORMAL HIGH (ref 70–99)
Potassium: 3.5 mmol/L (ref 3.5–5.1)
Sodium: 136 mmol/L (ref 135–145)

## 2017-10-17 LAB — SEDIMENTATION RATE: SED RATE: 128 mm/h — AB (ref 0–30)

## 2017-10-17 LAB — PREPARE RBC (CROSSMATCH)

## 2017-10-17 LAB — SAMPLE TO BLOOD BANK

## 2017-10-17 LAB — BRAIN NATRIURETIC PEPTIDE: B Natriuretic Peptide: 723 pg/mL — ABNORMAL HIGH (ref 0.0–100.0)

## 2017-10-17 MED ORDER — SODIUM CHLORIDE 0.9% IV SOLUTION
250.0000 mL | Freq: Once | INTRAVENOUS | Status: AC
  Administered 2017-10-18: 250 mL via INTRAVENOUS

## 2017-10-17 MED ORDER — SODIUM CHLORIDE 0.9% FLUSH
10.0000 mL | Freq: Once | INTRAVENOUS | Status: AC
  Administered 2017-10-17: 10 mL via INTRAVENOUS
  Filled 2017-10-17: qty 10

## 2017-10-17 MED ORDER — FLUCONAZOLE 100 MG PO TABS
200.0000 mg | ORAL_TABLET | Freq: Every day | ORAL | Status: DC
  Administered 2017-10-18 – 2017-10-19 (×2): 200 mg via ORAL
  Filled 2017-10-17 (×3): qty 2

## 2017-10-17 MED ORDER — ACETAMINOPHEN 325 MG PO TABS
650.0000 mg | ORAL_TABLET | Freq: Once | ORAL | Status: AC
  Administered 2017-10-18: 650 mg via ORAL
  Filled 2017-10-17: qty 2

## 2017-10-17 MED ORDER — LETROZOLE 2.5 MG PO TABS
2.5000 mg | ORAL_TABLET | Freq: Every day | ORAL | Status: DC
  Administered 2017-10-18 – 2017-10-19 (×2): 2.5 mg via ORAL
  Filled 2017-10-17 (×3): qty 1

## 2017-10-17 MED ORDER — ACETAMINOPHEN 650 MG RE SUPP: 650.0000 mg | Freq: Four times a day (QID) | RECTAL | Status: DC | PRN

## 2017-10-17 MED ORDER — METOPROLOL SUCCINATE ER 25 MG PO TB24
25.0000 mg | ORAL_TABLET | Freq: Every day | ORAL | Status: DC
  Administered 2017-10-18 – 2017-10-19 (×2): 25 mg via ORAL
  Filled 2017-10-17 (×2): qty 1

## 2017-10-17 MED ORDER — SODIUM CHLORIDE 0.9% FLUSH: 10.0000 mL | INTRAVENOUS | Status: DC | PRN

## 2017-10-17 MED ORDER — ACETAMINOPHEN 325 MG PO TABS
650.0000 mg | ORAL_TABLET | Freq: Four times a day (QID) | ORAL | Status: DC | PRN
  Filled 2017-10-17: qty 2

## 2017-10-17 MED ORDER — ACETAMINOPHEN 325 MG PO TABS
650.0000 mg | ORAL_TABLET | Freq: Once | ORAL | Status: AC
  Administered 2017-10-17: 650 mg via ORAL
  Filled 2017-10-17: qty 2

## 2017-10-17 MED ORDER — DOCUSATE SODIUM 100 MG PO CAPS
100.0000 mg | ORAL_CAPSULE | Freq: Two times a day (BID) | ORAL | Status: DC
  Administered 2017-10-17 – 2017-10-18 (×2): 100 mg via ORAL
  Filled 2017-10-17 (×4): qty 1

## 2017-10-17 MED ORDER — VANCOMYCIN HCL IN DEXTROSE 1-5 GM/200ML-% IV SOLN
1000.0000 mg | Freq: Once | INTRAVENOUS | Status: AC
  Administered 2017-10-17: 23:00:00 1000 mg via INTRAVENOUS
  Filled 2017-10-17 (×2): qty 200

## 2017-10-17 MED ORDER — ALPRAZOLAM 0.5 MG PO TABS
0.5000 mg | ORAL_TABLET | Freq: Three times a day (TID) | ORAL | Status: DC | PRN
  Administered 2017-10-18: 0.5 mg via ORAL
  Filled 2017-10-17: qty 1

## 2017-10-17 MED ORDER — PRAVASTATIN SODIUM 20 MG PO TABS
40.0000 mg | ORAL_TABLET | Freq: Every day | ORAL | Status: DC
  Administered 2017-10-17 – 2017-10-18 (×2): 40 mg via ORAL
  Filled 2017-10-17 (×2): qty 2

## 2017-10-17 MED ORDER — ONDANSETRON HCL 4 MG PO TABS
4.0000 mg | ORAL_TABLET | Freq: Four times a day (QID) | ORAL | Status: DC | PRN
Start: 2017-10-17 — End: 2017-10-19

## 2017-10-17 MED ORDER — METHYLPREDNISOLONE SODIUM SUCC 40 MG IJ SOLR
20.0000 mg | Freq: Once | INTRAMUSCULAR | Status: AC
  Administered 2017-10-18: 13:00:00 20 mg via INTRAVENOUS
  Filled 2017-10-17: qty 1

## 2017-10-17 MED ORDER — TBO-FILGRASTIM 480 MCG/0.8ML ~~LOC~~ SOSY
480.0000 ug | PREFILLED_SYRINGE | Freq: Once | SUBCUTANEOUS | Status: AC
  Administered 2017-10-17: 480 ug via SUBCUTANEOUS

## 2017-10-17 MED ORDER — FUROSEMIDE 20 MG PO TABS
20.0000 mg | ORAL_TABLET | Freq: Every day | ORAL | Status: DC
  Administered 2017-10-18 – 2017-10-19 (×2): 20 mg via ORAL
  Filled 2017-10-17 (×2): qty 1

## 2017-10-17 MED ORDER — RISAQUAD PO CAPS
1.0000 | ORAL_CAPSULE | Freq: Every day | ORAL | Status: DC
  Administered 2017-10-18 – 2017-10-19 (×2): 1 via ORAL
  Filled 2017-10-17 (×2): qty 1

## 2017-10-17 MED ORDER — LORATADINE 10 MG PO TABS
10.0000 mg | ORAL_TABLET | Freq: Every day | ORAL | Status: DC
  Administered 2017-10-18 – 2017-10-19 (×2): 10 mg via ORAL
  Filled 2017-10-17 (×2): qty 1

## 2017-10-17 MED ORDER — FAMOTIDINE 20 MG PO TABS
20.0000 mg | ORAL_TABLET | Freq: Every day | ORAL | Status: DC
  Administered 2017-10-18 – 2017-10-19 (×2): 20 mg via ORAL
  Filled 2017-10-17 (×2): qty 1

## 2017-10-17 MED ORDER — FUROSEMIDE 10 MG/ML IJ SOLN
20.0000 mg | Freq: Once | INTRAMUSCULAR | Status: DC
  Filled 2017-10-17: qty 2

## 2017-10-17 MED ORDER — SODIUM CHLORIDE 0.9 % IV SOLN
2.0000 g | INTRAVENOUS | Status: DC
  Administered 2017-10-17: 2 g via INTRAVENOUS
  Filled 2017-10-17: qty 2
  Filled 2017-10-17: qty 20

## 2017-10-17 MED ORDER — ACETAMINOPHEN 325 MG PO TABS
650.0000 mg | ORAL_TABLET | Freq: Once | ORAL | Status: AC
  Administered 2017-10-18: 650 mg via ORAL

## 2017-10-17 MED ORDER — POTASSIUM CHLORIDE 20 MEQ PO PACK
20.0000 meq | PACK | Freq: Once | ORAL | Status: AC
  Administered 2017-10-17: 20 meq via ORAL
  Filled 2017-10-17: qty 1

## 2017-10-17 MED ORDER — VANCOMYCIN HCL IN DEXTROSE 750-5 MG/150ML-% IV SOLN
750.0000 mg | INTRAVENOUS | Status: DC
  Filled 2017-10-17 (×3): qty 150

## 2017-10-17 MED ORDER — METRONIDAZOLE 500 MG PO TABS
500.0000 mg | ORAL_TABLET | Freq: Three times a day (TID) | ORAL | Status: DC
  Filled 2017-10-17 (×2): qty 1

## 2017-10-17 MED ORDER — ONDANSETRON HCL 4 MG/2ML IJ SOLN: 4.0000 mg | Freq: Four times a day (QID) | INTRAMUSCULAR | Status: DC | PRN

## 2017-10-17 MED ORDER — DIPHENHYDRAMINE HCL 25 MG PO CAPS
25.0000 mg | ORAL_CAPSULE | Freq: Once | ORAL | Status: AC
  Administered 2017-10-18: 25 mg via ORAL
  Filled 2017-10-17: qty 1

## 2017-10-17 MED ORDER — ACYCLOVIR 200 MG PO CAPS
400.0000 mg | ORAL_CAPSULE | Freq: Two times a day (BID) | ORAL | Status: DC
  Administered 2017-10-17 – 2017-10-19 (×4): 400 mg via ORAL
  Filled 2017-10-17 (×5): qty 2

## 2017-10-17 MED ORDER — PROCHLORPERAZINE MALEATE 10 MG PO TABS
10.0000 mg | ORAL_TABLET | Freq: Four times a day (QID) | ORAL | Status: DC | PRN
  Filled 2017-10-17: qty 1

## 2017-10-17 NOTE — H&P (Signed)
[facility] [place] NAME: Shelly Arias    MR#:  [id number]  DATE OF BIRTH:  1945-04-20  DATE OF ADMISSION:  10/17/2017  PRIMARY CARE PHYSICIAN: [name], MD   REQUESTING/REFERRING PHYSICIAN:   CHIEF COMPLAINT:  No chief complaint on file.   HISTORY OF PRESENT ILLNESS: Shelly Arias  is a 72 y.o. female with a known history history per below which includes metastatic breast cancer-chemotherapy had to be halted in April of this year due to myelodysplastic syndrome, patient accepted by hospitalist service to the hospital as a direct admit at the request of oncology for right leg cellulitis, patient with history of MRSA, evaluated the bedside, multiple family members present, patient complains of bilateral leg swelling for 2 months, right leg skin infection since September 28, 2017 which is worsening despite p.o. daily Levaquin which she takes prophylactically to prevent infection, per oncology want patient on neutropenic precautions, patient is full code, patient now be admitted for acute right lower extremity cellulitis which is failed outpatient management, chronic myelodysplasia syndrome, and metastatic breast cancer.  PAST MEDICAL HISTORY:   Past Medical History:  Diagnosis Date  . Blood dyscrasia   . Breast cancer ([place])    right, lumpectomy, radiation, chemo  . Breast cancer ([place])    Right, 2007  . Breast cancer ([place]) 06/20/2016   INVASIVE DUCTAL CARCINOMA.  . Collapsed lung 02/14/2017   RIGHT  . Complication of anesthesia    PT STATED AFTER BIL HIP SURGERIES SHE STOPPED BREATHING THE NIGHT AFTER SURGERY  . COPD (chronic obstructive pulmonary disease) ([place])   . GERD (gastroesophageal reflux disease)   . Headache   . History of chemotherapy   . History of methicillin resistant staphylococcus aureus (MRSA) 2011  . History of radiation therapy   . Hyperlipidemia   . Hypertension   . Liver cancer ([place]) 05/2016  . Lung cancer ([place])  05/2016  . Osteoarthritis    right hip  . Osteoporosis   . Squamous cell carcinoma    leg, Followed by Dr. Nicole Kindred    PAST SURGICAL HISTORY:  Past Surgical History:  Procedure Laterality Date  . ABDOMINAL HYSTERECTOMY    . BREAST BIOPSY Left 2002   left breast, calcifications  . BREAST BIOPSY Left 06/20/2016   INVASIVE DUCTAL CARCINOMA.  Marland Kitchen BREAST EXCISIONAL BIOPSY Right 2007   positive  . bunion repair    . ESOPHAGOGASTRODUODENOSCOPY (EGD) WITH PROPOFOL N/A 08/05/2016   Procedure: ESOPHAGOGASTRODUODENOSCOPY (EGD) WITH PROPOFOL;  Surgeon: San Jetty, MD;  Location: [facility] ENDOSCOPY;  Service: General;  Laterality: N/A;  . IR FLUORO GUIDE CV LINE RIGHT  07/25/2017  . JOINT REPLACEMENT    . left breast biopsy    . NASAL SINUS SURGERY    . PORTACATH PLACEMENT Right 04/13/2017   Procedure: INSERTION PORT-A-CATH- RIGHT INTERNAL JUGULAR;  Surgeon: Robert Bellow, MD;  Location: [facility] ORS;  Service: General;  Laterality: Right;  . right hip replacement    . SHOULDER SURGERY    . SQUAMOUS CELL CARCINOMA EXCISION     right leg, Dr. Nicole Kindred  . TOTAL HIP ARTHROPLASTY     right    SOCIAL HISTORY:  Social History   Tobacco Use  . Smoking status: Current Some Day Smoker    Packs/day: 0.25    Years: 30.00    Pack years: 7.50    Types: Cigarettes  . Smokeless tobacco: Never Used  Substance Use Topics  . Alcohol use: Not Currently  Alcohol/week: 0.0 standard drinks    Comment: socially    FAMILY HISTORY:  Family History  Problem Relation Age of Onset  . Heart disease Father 73  . Heart disease Mother   . Hyperlipidemia Mother   . Heart disease Brother   . Diabetes Brother   . Pancreatic cancer Brother   . Diabetes Maternal Grandmother   . Breast cancer Cousin   . Kidney disease Brother     DRUG ALLERGIES:  Allergies  Allergen Reactions  . Codeine Anaphylaxis  . Fish-Derived Products Anaphylaxis  . Morphine Sulfate Anaphylaxis    REACTION: anaphylaxis  .  Adhesive [Tape]     PAPER TAPE OK TO USE  . Oxycodone     Nausea and vomiting    REVIEW OF SYSTEMS:   CONSTITUTIONAL: No fever, +fatigue ,weakness.  EYES: No blurred or double vision.  EARS, NOSE, AND THROAT: No tinnitus or ear pain.  RESPIRATORY: No cough, shortness of breath, wheezing or hemoptysis.  CARDIOVASCULAR: No chest pain, orthopnea,+ edema.  GASTROINTESTINAL: No nausea, vomiting, diarrhea or abdominal pain.  GENITOURINARY: No dysuria, hematuria.  ENDOCRINE: No polyuria, nocturia,  HEMATOLOGY: No anemia, easy bruising or bleeding SKIN: Right leg skin infection  mUSCULOSKELETAL: No joint pain or arthritis.   NEUROLOGIC: No tingling, numbness, weakness.  PSYCHIATRY: No anxiety or depression.   MEDICATIONS AT HOME:  Prior to Admission medications   Medication Sig Start Date End Date Taking? Authorizing Provider  acyclovir (ZOVIRAX) 400 MG tablet TAKE 1 TABLET BY MOUTH TWICE A DAY 10/14/17   [name], MD  ALPRAZolam Duanne Moron) 0.5 MG tablet Take 1 tablet (0.5 mg total) by mouth every 8 (eight) hours as needed for anxiety. 07/30/17   [name], MD  cyanocobalamin (,VITAMIN B-12,) 1000 MCG/ML injection Inject 1,000 mcg into the muscle every 30 (thirty) days.    [provider]  fexofenadine (ALLEGRA) 180 MG tablet Take 180 mg by mouth every morning.     [provider]  fluconazole (DIFLUCAN) 200 MG tablet Take 1 tablet (200 mg total) by mouth daily. 06/24/17   [name], MD  furosemide (LASIX) 20 MG tablet Take 1 tablet (20 mg total) by mouth daily. 10/07/17   [name], MD  Glucosamine-Chondroitin (COSAMIN DS PO) Take 1 tablet by mouth daily. In the morning.    [provider]  letrozole (FEMARA) 2.5 MG tablet TAKE 1 TABLET (2.5 MG TOTAL) BY MOUTH DAILY. ONCE A DAY. 09/18/17   [name], MD  levofloxacin (LEVAQUIN) 500 MG tablet Take 500 mg by mouth daily.    [provider]  metoprolol  succinate (TOPROL-XL) 25 MG 24 hr tablet Take 1 tablet (25 mg total) by mouth daily. 05/02/17   [name], MD  pravastatin (PRAVACHOL) 40 MG tablet Take 1 tablet (40 mg total) by mouth daily. 05/02/17   [name], MD  Probiotic Product (PROBIOTIC & ACIDOPHILUS EX ST PO) Take 1 tablet by mouth daily.     [provider]  prochlorperazine (COMPAZINE) 10 MG tablet Take 1 tablet (10 mg total) by mouth every 6 (six) hours as needed for nausea or vomiting. 07/15/17   Verlon Au, NP  ranitidine (ZANTAC) 75 MG tablet Take 75 mg by mouth daily as needed for heartburn.     [provider]  Vitamin D, Ergocalciferol, (DRISDOL) 50000 units CAPS capsule TAKE 1 CAPSULE (50,000 UNITS TOTAL) BY MOUTH EVERY SATURDAY. IN THE MORNING. 08/12/17   [name],  MD      PHYSICAL EXAMINATION:   VITAL SIGNS: Blood pressure (!) 95/54, pulse 93, temperature 97.7 F (36.5 C), temperature source Oral, resp. rate 20, SpO2 100 %.  GENERAL:  72 y.o.-year-old patient lying in the bed with no acute distress.  Frail-appearing EYES: Pupils equal, round, reactive to light and accommodation. No scleral icterus. Extraocular muscles intact.  HEENT: Head atraumatic, normocephalic. Oropharynx and nasopharynx clear.  NECK:  Supple, no jugular venous distention. No thyroid enlargement, no tenderness.  LUNGS: Coarse breath sounds. No use of accessory muscles of respiration.  CARDIOVASCULAR: S1, S2 normal. No murmurs, rubs, or gallops.  ABDOMEN: Soft, nontender, nondistended. Bowel sounds present. No organomegaly or mass.  EXTREMITIES: + Bilateral lower extremity pitting edema, no cyanosis, or clubbing.  NEUROLOGIC: Cranial nerves II through XII are intact. Muscle strength 5/5 in all extremities. Sensation intact. Gait not checked.  PSYCHIATRIC: The patient is alert and oriented x 3.  SKIN: No obvious rash, lesion, or ulcer.   LABORATORY PANEL:   CBC Recent Labs  Lab 10/14/17 1100  10/17/17 1053  WBC 3.2* 1.7*  HGB 8.8* 7.4*  HCT 25.3* 21.5*  PLT 30* 11*  MCV 86.0 86.3  MCH 29.9 29.8  MCHC 34.8 34.5  RDW 13.7 13.0  LYMPHSABS 1.2 0.9*  MONOABS 0.1* 0.1*  EOSABS 0.0 0.0  BASOSABS 0.0 0.0   ------------------------------------------------------------------------------------------------------------------  Chemistries  Recent Labs  Lab 10/17/17 1110  NA 136  K 3.5  CL 98  CO2 26  GLUCOSE 125*  BUN 18  CREATININE 0.87  CALCIUM 9.1   ------------------------------------------------------------------------------------------------------------------ estimated creatinine clearance is 49.1 mL/min (by C-G formula based on SCr of 0.87 mg/dL). ------------------------------------------------------------------------------------------------------------------ No results for input(s): TSH, T4TOTAL, T3FREE, THYROIDAB in the last 72 hours.  Invalid input(s): FREET3   Coagulation profile No results for input(s): INR, PROTIME in the last 168 hours. ------------------------------------------------------------------------------------------------------------------- No results for input(s): DDIMER in the last 72 hours. -------------------------------------------------------------------------------------------------------------------  Cardiac Enzymes No results for input(s): CKMB, TROPONINI, MYOGLOBIN in the last 168 hours.  Invalid input(s): CK ------------------------------------------------------------------------------------------------------------------ Invalid input(s): POCBNP  ---------------------------------------------------------------------------------------------------------------  Urinalysis    Component Value Date/Time   COLORURINE YELLOW (A) 09/27/2017 1240   APPEARANCEUR HAZY (A) 09/27/2017 1240   LABSPEC 1.021 09/27/2017 1240   PHURINE 5.0 09/27/2017 1240   GLUCOSEU NEGATIVE 09/27/2017 1240   HGBUR NEGATIVE 09/27/2017 1240   BILIRUBINUR  NEGATIVE 09/27/2017 1240   KETONESUR NEGATIVE 09/27/2017 1240   PROTEINUR NEGATIVE 09/27/2017 1240   NITRITE NEGATIVE 09/27/2017 1240   LEUKOCYTESUR MODERATE (A) 09/27/2017 1240     RADIOLOGY: No results found.  EKG: Orders placed or performed in visit on 08/05/17  . EKG 12-Lead  . EKG 12-Lead  . EKG 12-Lead    IMPRESSION AND PLAN: *Acute right lower extremity cellulitis Failed outpatient management Admit to regular nursing for bed on our cellulitis protocol, check admission lab work, start empiric vancomycin/Rocephin/Flagyl, check blood cultures, and continue close medical monitoring  *Bilateral lower extremity edema, acute Ongoing for 2 months, no history of heart failure, noted history of numerous transfusions in the past Check BNP, echocardiogram, IV Lasix for now, check lower extremity Dopplers given right lower extremity pain to evaluate for possible DVT, continue close medical monitoring  *Chronic metastatic breast cancer Chemotherapy halted due to severe myelodysplasia syndrome Consult Dr. Brahmanday/oncology for expert opinion  *Acute on chronic myelodysplasia syndrome with severe pancytopenia  Oncology consulted for continuity of care  For transfusion of 1 pack of platelets, 1 unit packed red blood cells,  Granix x1, neutropenic precautions   *Chronic tobacco smoking abuse/dependency  Cessation counseling ordered, patient refuses nicotine patch   *COPD without exacerbation  Breathing treatments as needed   *Chronic GERD without esophagitis  PPI daily    All the records are reviewed and case discussed with ED provider. Management plans discussed with the patient, family and they are in agreement.  CODE STATUS:fall Code Status History    Date Active Date Inactive Code Status Order ID Comments User Context   08/05/2017 1448 08/07/2017 1824 Full Code [id number]  Epifanio Lesches, MD ED   07/21/2017 1841 07/25/2017 2033 Full Code [id number]  Bettey Costa, MD  Inpatient   08/03/2016 2042 08/05/2016 1506 Full Code [id number]  Vaughan Basta, MD ED    Advance Directive Documentation     Most Recent Value  Type of Advance Directive  Healthcare Power of [place], Living will  Pre-existing out of facility DNR order (yellow form or pink MOST form)  -  "MOST" Form in Place?  -       TOTAL TIME TAKING CARE OF THIS PATIENT: 45 minutes.    Avel Peace Leonidus Rowand M.D on 10/17/2017   Between 7am to 6pm - Pager - [phone]  After 6pm go to [email or web address] - password [email or web address] [facility]  Office  [phone]  CC: Primary care physician; [name], MD   Note: This dictation was prepared with Dragon dictation along with smaller phrase technology. Any transcriptional errors that result from this process are unintentional.

## 2017-10-17 NOTE — Plan of Care (Signed)
Pt progressing. Pt has allergy to  hydrocodone which was prescribed for moderate pain. MD discontinued pt. not currently complaining of pain. No new orders for moderate pain. Pt blood product allocated. On coming RN aware of needed administration.

## 2017-10-17 NOTE — Progress Notes (Signed)
Symptom Management [place]  [phone]) [phone] [phone]  Patient Care Team: Shelly Haven, MD as PCP - General (Family Medicine) Shelly Arias, DPM as Consulting Physician (Podiatry) Shelly Sickle, MD as Medical Oncologist (Hematology and Oncology) Shelly Castilla Forest Gleason, MD (General Surgery)   Name of the patient: Shelly Arias  [id number]  [birth year]   Date of visit: 10/17/17  Diagnosis-MDS and metastatic breast cancer  Chief complaint/ Reason for visit-skin infection  Heme/Onc history:  Oncology History   # April 2018- Left chest wall Bx- IMC; ER-PR-POS; her 2 Neu NEG; PET- mild hilar/distal adenopathy; ~1 cm right lung nodule/ several sub-centimeter.   # May 2018- cont [facility]; Abemacliclib [on HOLD sec to cytopenia]; MARCH 2019- PET near CR from breast cancer  # April 2017-isolated Thrombocytopenia- platelets- 113; worsening PANCYTOPENIA- April 2018- BMBx- no evidence of malignancy; MDS/Acute leuk; Karyotype/MDS- FISH panel-NEG [d/w Dr.Smir];   # June 2018- REPEAT BMBx/[facility] [June 2018-Dr.Foster; Aug 2018- Dr.Powell at [facility]]; No Diagnosis.   # April 11, 2017 [third bone marrow]-is still inconclusive; no obvious evidence of acute leukemia or obvious MDS except for mild dysplastic changes; and significantly reduced megakaryocytes. 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 [reviwed at [facility]; [facility]-NEG]  # April 1st 2019- Shelly Arias #1- severe pancytopenia  # July 2019-[ 4th BMBx-] shows variable cellularity between 10 to 70%; with scant erythroid and megakaryocytic precursors; left shifted myeloid lineage [likely from growth factor support]; no evidence of any blasts-no conclusive diagnosis noted.  No evidence of metastatic breast cancer noted.  -------------------------------------     # May 29th 2018- N-plate- suboptimal  improvement; NOV 1st week start promacta 50 mg/day; suboptimal response; DEC 1st- start promacta 75 mg/day  # DEC 18th-INCREASE PROMACTA to 150m/day  # 2006- RIGHT BREAST CA [pT1cN0M0; STAGE I; ER/PRPos; Her 2 Neu-NEG] s/p FEC x 6 [NSABP B-36]; Femara [Dec 2007-2012]  # Osteoporosis s/p Reclast; BMD- 2015-wnl- ca+vit D   Dr.Stewart [derm ? Squamous cell s/p freeze-oct 2018] --------------------------------------------------------------  DIAGNOSIS: [[name] ] # ? MDS /Meta.Breast cancer- ER/PR pos; her-NEG  STAGE:  IV  ;GOALS: palliative  CURRENT/MOST RECENT THERAPY: Shelly Arias [April 2019]; Letrozole [May 2017];   # Pt needs PLATELETS- WASHED/ CROSS-MATCHED PLATELETS        Carcinoma of overlapping sites of right breast in female, estrogen receptor positive [place]    MDS (myelodysplastic syndrome) ([place]    Interval history- [name] 72year old female with past medical history significant for MDS and metastatic breast cancer who presents to symptom management clinic for evaluation of a wound/cellulitis of right lower extremity. She first noticed a bruise in the area a few days ago and it turned red, hot, painful, and swollen since that time. Symptoms are worsening since onset and she can no longer bear full weight d/t pain. She has tried elevating legs which improved swelling minimally but did not help with redness, pain, or heat.    Overall she continues to feel tired and somewhat weak.  She is questioning if she needs blood and/or platelets.  She has shortness of breath with exertion and continues to lose weight and feel "too tired to eat".  She has been wearing compression stockings for swelling in her legs but is not currently able to wear them due to symptoms of right lower extremity.  She denies recent abnormal bleeding.  She continues prophylactic antibiotics, anti-viral, and antifungals.  ECOG FS:2 - Symptomatic, <  50% confined to bed  Review of systems- Review of  Systems  Constitutional: Positive for malaise/fatigue and weight loss. Negative for chills and fever.  HENT: Negative for congestion, ear discharge, ear pain, nosebleeds, sinus pain, sore throat and tinnitus.   Eyes: Negative.   Respiratory: Positive for shortness of breath (w/ exertion). Negative for cough and sputum production.   Cardiovascular: Positive for leg swelling (R > L). Negative for chest pain, palpitations, orthopnea and claudication.  Gastrointestinal: Negative for abdominal pain, blood in stool, constipation, diarrhea, heartburn, nausea and vomiting.  Genitourinary: Negative.   Musculoskeletal: Negative.   Skin: Negative.        Per HPI (RLE pain/swelling). Petechial rash stable.   Neurological: Negative for dizziness, tingling, weakness and headaches.  Endo/Heme/Allergies: Bruises/bleeds easily.  Psychiatric/Behavioral: Negative.      Current treatment-transfusion dependent for MDS. On Femara for breast cancer.   Allergies  Allergen Reactions  . Codeine Anaphylaxis  . Fish-Derived Products Anaphylaxis  . Morphine Sulfate Anaphylaxis    REACTION: anaphylaxis  . Adhesive [Tape]     PAPER TAPE OK TO USE  . Oxycodone     Nausea and vomiting    Past Medical History:  Diagnosis Date  . Blood dyscrasia   . Breast cancer ([place])    right, lumpectomy, radiation, chemo  . Breast cancer ([place])    Right, 2007  . Breast cancer ([place]) 06/20/2016   INVASIVE DUCTAL CARCINOMA.  . Collapsed lung 02/14/2017   RIGHT  . Complication of anesthesia    PT STATED AFTER BIL HIP SURGERIES SHE STOPPED BREATHING THE NIGHT AFTER SURGERY  . COPD (chronic obstructive pulmonary disease) ([place])   . GERD (gastroesophageal reflux disease)   . Headache   . History of chemotherapy   . History of methicillin resistant staphylococcus aureus (MRSA) 2011  . History of radiation therapy   . Hyperlipidemia   . Hypertension   . Liver cancer ([place]) 05/2016  . Lung cancer ([place]) 05/2016  .  Osteoarthritis    right hip  . Osteoporosis   . 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 ABDOMINAL HYSTERECTOMY    . BREAST BIOPSY Left 2002   left breast, calcifications  . BREAST BIOPSY Left 06/20/2016   INVASIVE DUCTAL CARCINOMA.  Marland Kitchen BREAST EXCISIONAL BIOPSY Right 2007   positive  . bunion repair    . ESOPHAGOGASTRODUODENOSCOPY (EGD) WITH PROPOFOL N/A 08/05/2016   Procedure: ESOPHAGOGASTRODUODENOSCOPY (EGD) WITH PROPOFOL;  Surgeon: San Jetty, MD;  Location: [facility] ENDOSCOPY;  Service: General;  Laterality: N/A;  . IR FLUORO GUIDE CV LINE RIGHT  07/25/2017  . JOINT REPLACEMENT    . left breast biopsy    . NASAL SINUS SURGERY    . PORTACATH PLACEMENT Right 04/13/2017   Procedure: INSERTION PORT-A-CATH- RIGHT INTERNAL JUGULAR;  Surgeon: Robert Bellow, MD;  Location: [facility] ORS;  Service: General;  Laterality: Right;  . right hip replacement    . SHOULDER SURGERY    . SQUAMOUS CELL CARCINOMA EXCISION     right leg, Dr. Nicole Kindred  . TOTAL HIP ARTHROPLASTY     right    Social History   Socioeconomic History  . Marital status: Married    Spouse name: Not on file  . Number of children: Not on file  . Years of education: Not on file  . Highest education level: Not on file  Occupational History  . Not on file  Social Needs  . Financial resource strain: Not hard at all  . Food insecurity:    Worry: Never true    Inability: Never true  . Transportation needs:    Medical: No    Non-medical: No  Tobacco Use  . Smoking status: Current Some Day Smoker    Packs/day: 0.25    Years: 30.00    Pack years: 7.50    Types: Cigarettes  . Smokeless tobacco: Never Used  Substance and Sexual Activity  . Alcohol use: Not Currently    Alcohol/week: 0.0 standard drinks    Comment: socially  . Drug use: No  . Sexual activity: Not Currently  Lifestyle  . Physical activity:    Days per week: 4 days    Minutes per  session: 30 min  . Stress: Not at all  Relationships  . Social connections:    Talks on phone: Three times a week    Gets together: Twice a week    Attends religious service: More than 4 times per year    Active member of club or organization: No    Attends meetings of clubs or organizations: Never    Relationship status: Married  . Intimate partner violence:    Fear of current or ex partner: No    Emotionally abused: No    Physically abused: No    Forced sexual activity: No  Other Topics Concern  . Not on file  Social History Narrative  . Not on file    Family History  Problem Relation Age of Onset  . Heart disease Father 78  . Heart disease Mother   . Hyperlipidemia Mother   . Heart disease Brother   . Diabetes Brother   . Pancreatic cancer Brother   . Diabetes Maternal Grandmother   . Breast cancer Cousin   . Kidney disease Brother     No current facility-administered medications for this visit.  No current outpatient medications on file.  Facility-Administered Medications Ordered in Other Visits:  .  0.9 %  sodium chloride infusion, , Intravenous, Continuous, Shelly Sickle, MD, Stopped at 06/28/17 1450 .  0.9 %  sodium chloride infusion, , Intravenous, Continuous, Shelly Sickle, MD, Stopped at 07/29/17 1515 .  furosemide (LASIX) injection 20 mg, 20 mg, Intravenous, Once, Salary, Montell D, MD .  heparin lock flush 100 unit/mL, 500 Units, Intravenous, Once, Brahmanday, Lenetta Quaker R, MD .  potassium chloride (KLOR-CON) packet 20 mEq, 20 mEq, Oral, Once, Salary, Montell D, MD .  sodium chloride flush (NS) 0.9 % injection 10 mL, 10 mL, Intravenous, PRN, Charlaine Dalton R, MD .  sodium chloride flush (NS) 0.9 % injection 10 mL, 10 mL, Intravenous, PRN, Verlon Au, NP .  sodium chloride flush (NS) 0.9 % injection 10 mL, 10 mL, Intravenous, PRN, Charlaine Dalton R, MD, 10 mL at 07/29/17 1405 .  vancomycin (VANCOCIN) IVPB 1000 mg/200 mL premix, 1,000  mg, Intravenous, Once, Salary, Montell D, MD .  Derrill Memo ON 10/18/2017] vancomycin (VANCOCIN) IVPB 750 mg/150 ml premix, 750 mg, Intravenous, Q18H, Salary, Montell D, MD  Physical exam:  Vitals:   10/17/17 1113  BP: (!) 93/59  Pulse: (!) 108  Resp: 18  Temp: 97.6 F (36.4 C)  TempSrc: Tympanic  Weight: 127 lb (57.6 kg)  Height: 5' 1"  (1.549 m)   Physical Exam  Constitutional: She is oriented to person, place, and time.  Thin female.  In wheelchair.  Accompanied by husband.  HENT:  Head: Atraumatic.  Nose:  Nose normal.  Mouth/Throat: Oropharynx is clear and moist. No oropharyngeal exudate.  Eyes: Conjunctivae are normal. No scleral icterus.  Neck: Normal range of motion.  Cardiovascular: Normal rate, regular rhythm and normal heart sounds.  Pulmonary/Chest: Effort normal and breath sounds normal.  Abdominal: Soft. Bowel sounds are normal.  Musculoskeletal: She exhibits edema.       Legs: Neurological: She is alert and oriented to person, place, and time.  Psychiatric: She has a normal mood and affect.     CMP Latest Ref Rng & Units 10/17/2017  Glucose 70 - 99 mg/dL 125(H)  BUN 8 - 23 mg/dL 18  Creatinine 0.44 - 1.00 mg/dL 0.87  Sodium 135 - 145 mmol/L 136  Potassium 3.5 - 5.1 mmol/L 3.5  Chloride 98 - 111 mmol/L 98  CO2 22 - 32 mmol/L 26  Calcium 8.9 - 10.3 mg/dL 9.1  Total Protein 6.5 - 8.1 g/dL -  Total Bilirubin 0.3 - 1.2 mg/dL -  Alkaline Phos 38 - 126 U/L -  AST 15 - 41 U/L -  ALT 14 - 54 U/L -   CBC Latest Ref Rng & Units 10/17/2017  WBC 3.6 - 11.0 K/uL 1.7(L)  Hemoglobin 12.0 - 16.0 g/dL 7.4(L)  Hematocrit 35.0 - 47.0 % 21.5(L)  Platelets 150 - 400 K/uL 11(LL)    No images are attached to the encounter.  No results found.  Assessment and plan- Patient is a 72 y.o. female with MDS and metastatic breast cancer who presents to symptom management clinic for cellulitis.   1. Cellulitis-right lower extremity cellulitis, complicated by underlying immunocompromise  state (see below). No current signs of toxicity & not d/t indweling device, however, symptoms worsening despite levaquin antibiotic prophylaxis. Prior history of MRSA.  Recommend IV antibiotics, likely vancomycin. If improvement over next few days, may be able to transition to oral antibiotics.   2. MDS- hx of high grade MDS vs bone marrow failure w/o clear etiology s/p multiple bone marrow biopsies. Received Shelly Arias 06/10/17, currently held due to worsening anemia, thrombocytopenia, and neutropenia (see below).  Currently awaiting fourth opinion at [facility] with Dr. Freddrick March.   3. Neutropenia- severe, slightly worse today. WBC 1.7, ANC 0.7 today. Will give granix in clinic today and would continue daily with goal ANC > 1500.  Recommend neutropenic precautions.  4. Anemia- severe, slightly worse today. Transfusion dependent. hmg today 7.4. Will plan for 1 unit pRBCs in hospital tomorrow.  Continue supportive transfusions with PRBCs weekly.  5. Thrombocytopenia- severe, slightly worse today.  No nosebleeds or worsening petechial rash today.  Plt today 11.  Patient requires washed/crossmatched platelets 1-2 times weekly.  We will plan for platelet transfusion in hospital tomorrow.  Orders entered by primary med-onc team.    6. Stage IV Recurrent/Metastatic Breast Cancer- er/pr positive, her-2/neu negative. On Femara; PET 06/2017 showed stable disease w/o evidence of active breast cancer. Clinically stable. Plans to repeat imaging Oct-Nov 2019 to reassess.   Discussed case and findings with Dr. Rogue Bussing who independently assessed patient and directly contributed to management and plan of care with recommendation to proceed with hospital admission for IV antibiotic therapy.   Case discussed with Dr. Vianne Bulls, hospitalist, who accepts for admission.   Patient to follow-up with Dr. Rogue Bussing upon discharge.   Visit Diagnosis 1. Cellulitis and abscess of right lower extremity   2. MDS (myelodysplastic  syndrome) ([place])   3. Neutropenia, unspecified type ([place])   4. Thrombocytopenia ([place])   5. Anemia, unspecified type   6.  Carcinoma of overlapping sites of right breast in female, estrogen receptor positive ([place])     Patient expressed understanding and was in agreement with this plan. She also understands that She can call clinic at any time with any questions, concerns, or complaints.   Thank you for allowing me to participate in the care of this very pleasant patient.   Beckey Rutter, DNP, AGNP-C [place] at [facility] [phone] (work cell) [phone] (office)

## 2017-10-17 NOTE — Progress Notes (Signed)
Patient requesting Tylenol 650 mg tablet once for leg pain. Rates pain in right leg 10/10 - Obtained verbal order from Ander Purpura, NP-  Right chest Port accessed-blood return noted. Pending hp admission.

## 2017-10-17 NOTE — Progress Notes (Signed)
Call report to [place] at 1348-clinical hand off provided. Patient to be transported to admitting desk in medical mall  - via w/c via volunteer  Patient has a cane- glow in the dark handle. Patient has elected to keep this with her for ambulation purposes while in the hospital.

## 2017-10-17 NOTE — Progress Notes (Signed)
Family Meeting Note  Advance Directive:yes  Today a meeting took place with the Patient.  Patient is able to participate  The following clinical team members were present during this meeting:MD  The following were discussed:Patient's diagnosis:cellulitis/breast cancer and metastatic/myelodysplastic syndrome, Patient's progosis: Unable to determine and Goals for treatment: Full Code  Additional follow-up to be provided: prn  Time spent during discussion:20 minutes  Gorden Harms, MD

## 2017-10-17 NOTE — Progress Notes (Signed)
V/o from [place], NP - proceed with granix today.-administered at 1322 in cancer center  1336-Also spoke with [facility] in blood bank. Patient has positive antibody for type/screen. Most likely could have 1 unit of PRBC ready today to infuse inpatient. 1 unit of plt infusion (washed/crossmatched) will not be available until tomorrow.

## 2017-10-17 NOTE — Progress Notes (Signed)
Pharmacy Antibiotic Note  Shelly Arias is a 72 y.o. female admitted on 10/17/2017 with cellulitis of right leg treated since September 28, 2017 which is worsening despite p.o. daily Levaquin. She was admitted here in early May for sepsis and was treated with vancomycin and Zosyn. Pharmacy has been consulted for vancomycin dosing. Pertinent PMH includes metastatic breast cancer (chemotherapy had to be halted in April of this year due to myelodysplastic syndrome), h/o MRSA  Plan: Vancomycin 750 IV every 18 hours starting 10 hours after 1st 1000mg  dose Ke 0.044, Vd 40L, T1/2 16h, calculated concentrations at steady-state: 34.1/16.1 mcg/mL.  Goal trough 15-20 mcg/mL.  Temp (24hrs), Avg:97.7 F (36.5 C), Min:97.6 F (36.4 C), Max:97.7 F (36.5 C)  Recent Labs  Lab 10/14/17 1100 10/17/17 1053 10/17/17 1110  WBC 3.2* 1.7*  --   CREATININE  --   --  0.87    Estimated Creatinine Clearance: 49.1 mL/min (by C-G formula based on SCr of 0.87 mg/dL).    Allergies  Allergen Reactions  . Codeine Anaphylaxis  . Fish-Derived Products Anaphylaxis  . Morphine Sulfate Anaphylaxis    REACTION: anaphylaxis  . Adhesive [Tape]     PAPER TAPE OK TO USE  . Oxycodone     Nausea and vomiting    Antimicrobials this admission: Vancomycin 8/8 >>  Microbiology results: 8/8 BCx: pending  Thank you for allowing pharmacy to be a part of this patient's care.  Dallie Piles, PharmD 10/17/2017 3:19 PM

## 2017-10-18 ENCOUNTER — Ambulatory Visit: Payer: Medicare Other

## 2017-10-18 ENCOUNTER — Inpatient Hospital Stay: Payer: Medicare Other

## 2017-10-18 ENCOUNTER — Inpatient Hospital Stay (HOSPITAL_COMMUNITY)
Admission: AD | Admit: 2017-10-18 | Discharge: 2017-10-18 | Disposition: A | Discharge status: Home / Self Care | Payer: Medicare Other | Source: Ambulatory Visit | Attending: Family Medicine | Admitting: Family Medicine

## 2017-10-18 DIAGNOSIS — R9431 Abnormal electrocardiogram [ECG] [EKG]: Secondary | ICD-10-CM

## 2017-10-18 LAB — BASIC METABOLIC PANEL
Anion gap: 7 (ref 5–15)
BUN: 18 mg/dL (ref 8–23)
CO2: 29 mmol/L (ref 22–32)
Calcium: 8.7 mg/dL — ABNORMAL LOW (ref 8.9–10.3)
Chloride: 101 mmol/L (ref 98–111)
Creatinine, Ser: 0.84 mg/dL (ref 0.44–1.00)
GFR calc Af Amer: >60 mL/min (ref >60)
GFR calc non Af Amer: >60 mL/min (ref >60)
Glucose, Bld: 123 mg/dL — ABNORMAL HIGH (ref 70–99)
Potassium: 3.8 mmol/L (ref 3.5–5.1)
Sodium: 137 mmol/L (ref 135–145)

## 2017-10-18 LAB — CBC
HCT: 26.7 % — ABNORMAL LOW (ref 35.0–47.0)
Hemoglobin: 9.4 g/dL — ABNORMAL LOW (ref 12.0–16.0)
MCH: 29.7 pg (ref 26.0–34.0)
MCHC: 35.3 g/dL (ref 32.0–36.0)
MCV: 84 fL (ref 80.0–100.0)
PLATELETS: 6 10*3/uL — AB (ref 150–440)
RBC: 3.18 MIL/uL — ABNORMAL LOW (ref 3.80–5.20)
RDW: 14.2 % (ref 11.5–14.5)
WBC: 3.1 10*3/uL — AB (ref 3.6–11.0)

## 2017-10-18 LAB — PREALBUMIN: PREALBUMIN: 13.8 mg/dL — AB (ref 18–38)

## 2017-10-18 LAB — C-REACTIVE PROTEIN: CRP: 10.8 mg/dL — ABNORMAL HIGH (ref <1.0)

## 2017-10-18 LAB — HEMOGLOBIN A1C
Hgb A1c MFr Bld: 7.2 % — ABNORMAL HIGH (ref 4.8–5.6)
Mean Plasma Glucose: 159.94 mg/dL

## 2017-10-18 MED ORDER — LORAZEPAM 0.5 MG PO TABS
0.5000 mg | ORAL_TABLET | Freq: Four times a day (QID) | ORAL | Status: DC | PRN
  Administered 2017-10-18 (×2): 0.5 mg via ORAL
  Filled 2017-10-18 (×2): qty 1

## 2017-10-18 MED ORDER — VANCOMYCIN HCL IN DEXTROSE 750-5 MG/150ML-% IV SOLN
750.0000 mg | INTRAVENOUS | Status: DC
  Administered 2017-10-18 – 2017-10-19 (×2): 750 mg via INTRAVENOUS
  Filled 2017-10-18 (×3): qty 150

## 2017-10-18 MED ORDER — NICOTINE 21 MG/24HR TD PT24
21.0000 mg | MEDICATED_PATCH | Freq: Every day | TRANSDERMAL | Status: DC
  Administered 2017-10-18: 10:00:00 21 mg via TRANSDERMAL
  Filled 2017-10-18: qty 1

## 2017-10-18 NOTE — Progress Notes (Signed)
Inpatient Diabetes Program Recommendations  AACE/ADA: New Consensus Statement on Inpatient Glycemic Control (2015)  Target Ranges:  Prepandial:   less than 140 mg/dL      Peak postprandial:   less than 180 mg/dL (1-2 hours)      Critically ill patients:  140 - 180 mg/dL   Results for Shelly, Arias (MRN [id number]) as of 10/18/2017 09:38  Ref. Range 10/17/2017 11:10 10/17/2017 18:51 10/18/2017 06:13  Glucose Latest Ref Range: 70 - 99 mg/dL 125 (H) 134 (H) 123 (H)   Results for Shelly, Arias (MRN [id number]) as of 10/18/2017 09:38  Ref. Range 10/17/2017 18:51  Hemoglobin A1C Latest Ref Range: 4.8 - 5.6 % 7.2 (H)    Admit with: Acute R LE cellulitis complicated by underlying immunocompromise state  History: Metastatic breast cancer/ Myelodysplastic syndrome  NO History of Diabetes noted in H&P (may have taken steroids in the past)     MD- Note that Hemoglobin A1c level was drawn for this patient on admission.  Results show value is 7.2%.  Question if this level is accurate given pt's Hemoglobin level was low on admission (7.2 g/dl)??  Also question if pt's past medical history along with previous medical treatments and current infection may have affected the A1c results?  Recommend patient have her Hemoglobin A1c drawn again as an outpatient when her labs are more stable      --Will follow patient during hospitalization--  Wyn Quaker RN, MSN, CDE Diabetes Coordinator Inpatient Glycemic Control Team Team Pager: [phone] (8a-5p)

## 2017-10-18 NOTE — Progress Notes (Signed)
   10/18/17 1405  Clinical Encounter Type  Visited With Patient  Visit Type Initial;Spiritual support  Referral From Nurse  Consult/Referral To Chaplain  Spiritual Encounters  Spiritual Needs Prayer;Emotional   [place] visited the patient while rounding on 1C. Ms. Chismar was welcoming and shared how a trip to the beach a few weeks ago went well. I will follow up with the patient as needed.

## 2017-10-18 NOTE — Progress Notes (Signed)
PT Cancellation Note  Patient Details Name: Shelly Arias MRN: [id number] DOB: 03/07/1946   Cancelled Treatment:    Reason Eval/Treat Not Completed: Patient not medically ready PT chart reviewed and noted platelets 6 K/uL which is below activity guidelines and trending down. PT discussed with nurse and was informed that pt is awaiting platelet transfusion today (8/9). PT will reassess when pt is medically stable.  Rosario Adie, SPT    Rosario Adie 10/18/2017, 1:32 PM

## 2017-10-18 NOTE — Progress Notes (Signed)
*  PRELIMINARY RESULTS* Echocardiogram 2D Echocardiogram has been performed.  Shelly Arias 10/18/2017, 10:20 AM

## 2017-10-18 NOTE — Progress Notes (Signed)
[name] NAME: Shelly Arias    MR#:  [id number]  DATE OF BIRTH:  March 04, 1946  SUBJECTIVE:  CHIEF COMPLAINT:  No chief complaint on file.  Sent from cancer center due to cellulitis. She is on levaquin oral chronic as prophylaxis.  cellulitis is better some today after Abx. REVIEW OF SYSTEMS:  CONSTITUTIONAL: No fever, fatigue or weakness.  EYES: No blurred or double vision.  EARS, NOSE, AND THROAT: No tinnitus or ear pain.  RESPIRATORY: No cough, shortness of breath, wheezing or hemoptysis.  CARDIOVASCULAR: No chest pain, orthopnea, edema.  GASTROINTESTINAL: No nausea, vomiting, diarrhea or abdominal pain.  GENITOURINARY: No dysuria, hematuria.  ENDOCRINE: No polyuria, nocturia,  HEMATOLOGY: No anemia, easy bruising or bleeding SKIN: No rash or lesion. MUSCULOSKELETAL: No joint pain or arthritis.   NEUROLOGIC: No tingling, numbness, weakness.  PSYCHIATRY: No anxiety or depression.   ROS  DRUG ALLERGIES:   Allergies  Allergen Reactions  . Codeine Anaphylaxis  . Fish-Derived Products Anaphylaxis  . Morphine Sulfate Anaphylaxis    REACTION: anaphylaxis  . Adhesive [Tape]     PAPER TAPE OK TO USE  . Oxycodone     Nausea and vomiting    VITALS:  Blood pressure 127/70, pulse (!) 103, temperature (!) 97.5 F (36.4 C), temperature source Oral, resp. rate 20, SpO2 100 %.  PHYSICAL EXAMINATION:  GENERAL:  72 y.o.-year-old patient lying in the bed with no acute distress.  EYES: Pupils equal, round, reactive to light and accommodation. No scleral icterus. Extraocular muscles intact.  HEENT: Head atraumatic, normocephalic. Oropharynx and nasopharynx clear.  NECK:  Supple, no jugular venous distention. No thyroid enlargement, no tenderness.  LUNGS: Normal breath sounds bilaterally, no wheezing, rales,rhonchi or crepitation. No use of accessory muscles of respiration.  CARDIOVASCULAR: S1, S2 normal. No murmurs, rubs, or  gallops.  ABDOMEN: Soft, nontender, nondistended. Bowel sounds present. No organomegaly or mass.  EXTREMITIES: No pedal edema, cyanosis, or clubbing. Right Leg in middle third- have redness, warm and tender. NEUROLOGIC: Cranial nerves II through XII are intact. Muscle strength 4/5 in all extremities. Sensation intact. Gait not checked.  PSYCHIATRIC: The patient is alert and oriented x 3.  SKIN: No obvious rash, lesion, or ulcer.   Physical Exam LABORATORY PANEL:   CBC Recent Labs  Lab 10/18/17 1130  WBC 3.1*  HGB 9.4*  HCT 26.7*  PLT 6*   ------------------------------------------------------------------------------------------------------------------  Chemistries  Recent Labs  Lab 10/17/17 1851 10/18/17 0613  NA 138 137  K 3.6 3.8  CL 100 101  CO2 31 29  GLUCOSE 134* 123*  BUN 19 18  CREATININE 0.85 0.84  CALCIUM 8.6* 8.7*  AST 37  --   ALT 41  --   ALKPHOS 97  --   BILITOT 0.3  --    ------------------------------------------------------------------------------------------------------------------  Cardiac Enzymes No results for input(s): TROPONINI in the last 168 hours. ------------------------------------------------------------------------------------------------------------------  RADIOLOGY:  US Venous Img Lower Bilateral  Result Date: 10/17/2017 CLINICAL DATA:  72 year old female with bilateral lower extremity edema for the past 2 months EXAM: BILATERAL LOWER EXTREMITY VENOUS DOPPLER ULTRASOUND TECHNIQUE: Gray-scale sonography with graded compression, as well as color Doppler and duplex ultrasound were performed to evaluate the lower extremity deep venous systems from the level of the common femoral vein and including the common femoral, femoral, profunda femoral, popliteal and calf veins including the posterior tibial, peroneal and gastrocnemius veins when visible. The superficial great saphenous vein was also interrogated. Spectral Doppler was  utilized to  evaluate flow at rest and with distal augmentation maneuvers in the common femoral, femoral and popliteal veins. COMPARISON:  None. FINDINGS: RIGHT LOWER EXTREMITY Common Femoral Vein: No evidence of thrombus. Normal compressibility, respiratory phasicity and response to augmentation. Increased pulsatility of the venous waveform. Saphenofemoral Junction: No evidence of thrombus. Normal compressibility and flow on color Doppler imaging. Profunda Femoral Vein: No evidence of thrombus. Normal compressibility and flow on color Doppler imaging. Femoral Vein: No evidence of thrombus. Normal compressibility, respiratory phasicity and response to augmentation. Popliteal Vein: No evidence of thrombus. Normal compressibility, respiratory phasicity and response to augmentation. Calf Veins: No evidence of thrombus. Normal compressibility and flow on color Doppler imaging. Superficial Great Saphenous Vein: No evidence of thrombus. Normal compressibility. Venous Reflux:  None. Other Findings:  None. LEFT LOWER EXTREMITY Common Femoral Vein: No evidence of thrombus. Normal compressibility, respiratory phasicity and response to augmentation. Increased pulsatility of the venous waveform. Saphenofemoral Junction: No evidence of thrombus. Normal compressibility and flow on color Doppler imaging. Profunda Femoral Vein: No evidence of thrombus. Normal compressibility and flow on color Doppler imaging. Femoral Vein: No evidence of thrombus. Normal compressibility, respiratory phasicity and response to augmentation. Popliteal Vein: No evidence of thrombus. Normal compressibility, respiratory phasicity and response to augmentation. Calf Veins: No evidence of thrombus. Normal compressibility and flow on color Doppler imaging. Superficial Great Saphenous Vein: No evidence of thrombus. Normal compressibility. Venous Reflux:  None. Other Findings:  None. IMPRESSION: 1. No evidence of deep venous thrombosis in either lower extremity. 2.  Increased pulsatility of the venous waveforms as can be seen in the setting of elevated right heart pressures. Differential considerations include tricuspid regurgitation, right-sided heart dysfunction/failure, pulmonary arterial hypertension and chronic COPD. Electronically Signed   By: Jacqulynn Cadet M.D.   On: 10/17/2017 16:51    ASSESSMENT AND PLAN:   Active Problems:   Cellulitis  *Acute right lower extremity cellulitis Failed outpatient management Follow cx. IV vanc. She is on chronic oral levaquin due to neutropenia.  *Bilateral lower extremity edema, acute Ongoing for 2 months, no history of heart failure, noted history of numerous transfusions in the past No dvt as per doppler. Echo done, pending report.  *Chronic metastatic breast cancer Chemotherapy halted due to severe myelodysplasia syndrome Consult Dr. 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  *Chronic tobacco smoking abuse/dependency  Cessation counseling done for 4 min, ordered nicotine patch.   *COPD without exacerbation  Breathing treatments as needed   *Chronic GERD without esophagitis  PPI daily   * anxiety    Ordered ativan PRN.  All the records are reviewed and case discussed with Care Management/Social Workerr. Management plans discussed with the patient, family and they are in agreement.  CODE STATUS: Full.  TOTAL TIME TAKING CARE OF THIS PATIENT: 35 minutes.     POSSIBLE D/C IN 1-2 DAYS, DEPENDING ON CLINICAL CONDITION.   Vaughan Basta M.D on 10/18/2017   Between 7am to 6pm - Pager - [phone]  After 6pm go to [email or web address] - password [email or web address]  [facility]  Office  [phone]  CC: Primary care physician; [name], MD  Note: This dictation was prepared with Dragon dictation along with smaller  Company secretary. Any transcriptional errors that result from this process are unintentional.

## 2017-10-18 NOTE — Consult Note (Signed)
[place] Nurse wound consult note Patient in [facility] room 129.  Undergoing an echocardiogram at the time of my visit.  I was not able to visualize the right leg due to the diagnostic procedure she is having. Reason for Consult: Right leg cellulitis I spoke at length with the patient's primary RN, Patrice Paradise, about the leg.  She states that at her last assessment it was red, without any open wounds.  There were scattered areas that had some scant oozing. Plan of care:  Gentle cleansing. Moisturizing lotion if needed.  Xeroform over any areas that are oozing, secure with kerlex.  Also, encourage the patient to elevate the legs when not ambulating. Thank you for the consult.  Discussed plan of care with the bedside nurse.  The [place] nurse will not follow at this time.  Please re-consult the [facility] team if needed.  Val Riles, RN, MSN, CWOCN, CNS-BC, pager [phone]

## 2017-10-18 NOTE — Progress Notes (Addendum)
Initial Nutrition Assessment  DOCUMENTATION CODES:   Non-severe (moderate) malnutrition in context of chronic illness  INTERVENTION:  Boost plus and snacks per husband  NUTRITION DIAGNOSIS:   Moderate Malnutrition related to chronic illness(breast ca with mets) as evidenced by moderate muscle depletion, moderate fat depletion.  GOAL:   Patient will meet greater than or equal to 90% of their needs  MONITOR:   PO intake, Supplement acceptance, Weight trends  REASON FOR ASSESSMENT:   Consult Wound healing  ASSESSMENT:   Patient with PMH metastatic breast cancer on chemo which had to be held in April due to myelodysplastic syndrome, COPD, GERD, presents with BL LE edema and lower extremity cellulitis.    Spoke with patient at bedside. She reports inability to taste much other than foods with very potent flavors like vinegar and cinnamon. Patient was eating tomato soup during this RD's visit and seemed to be eating ok. She complains of poor appetite, but seems to make herself eat. Normal PO intake consists of cereal or lemon cake or nothing for breakfast, soup or half a sandwich and chips, or 1/2 of a fast food meal for lunch, and 2 vegetables an meat for dinner. States sometimes the dinner "doesn't work out" and meat makes her nauseous. Reports 38 pound weight loss since Mar 2018. More recently per chart, she exhibits a 13 pound/9.3% insignificant weight loss over the past 5 months. Husband is bringing her boost plus and snacks during admission. Will continue to monitor PO intake.  Labs reviewed Medications reviewed and include:  Colace Decatabine as an outpatient for myelodysplastic syndrome  NUTRITION - FOCUSED PHYSICAL EXAM:    Most Recent Value  Orbital Region  Mild depletion  Upper Arm Region  Moderate depletion  Thoracic and Lumbar Region  Moderate depletion  Buccal Region  Mild depletion  Temple Region  Mild depletion  Clavicle Bone Region  Moderate depletion   Clavicle and Acromion Bone Region  Mild depletion  Scapular Bone Region  Mild depletion  Dorsal Hand  Mild depletion  Patellar Region  Moderate depletion  Anterior Thigh Region  Moderate depletion  Posterior Calf Region  Moderate depletion  Edema (RD Assessment)  Moderate [LLE]  Hair  Reviewed  Eyes  Reviewed  Mouth  Reviewed  Skin  Reviewed  Nails  Reviewed       Diet Order:   Diet Order            Diet Heart Room service appropriate? Yes; Fluid consistency: Thin  Diet effective now              EDUCATION NEEDS:   Education needs have been addressed  Skin:  Skin Assessment: Reviewed RN Assessment  Last BM:  10/16/2017  Height:   Ht Readings from Last 1 Encounters:  10/17/17 5\' 1"  (1.549 m)    Weight:   Wt Readings from Last 1 Encounters:  10/17/17 57.6 kg    Ideal Body Weight:  47.72 kg  BMI:  There is no height or weight on file to calculate BMI.  Estimated Nutritional Needs:   Kcal:  1700-2000 calories  Protein:  85-100 grams  Fluid:  1.7-2.0L    Satira Anis. Pari Lombard, MS, RD LDN Inpatient Clinical Dietitian Pager [phone]

## 2017-10-19 LAB — PREPARE PLATELET PHERESIS: Unit division: 0

## 2017-10-19 LAB — CBC
HEMATOCRIT: 23.5 % — AB (ref 35.0–47.0)
HEMOGLOBIN: 8.2 g/dL — AB (ref 12.0–16.0)
MCH: 29.3 pg (ref 26.0–34.0)
MCHC: 34.9 g/dL (ref 32.0–36.0)
MCV: 84.1 fL (ref 80.0–100.0)
Platelets: 36 10*3/uL — ABNORMAL LOW (ref 150–440)
RBC: 2.8 MIL/uL — ABNORMAL LOW (ref 3.80–5.20)
RDW: 14.1 % (ref 11.5–14.5)
WBC: 3.2 10*3/uL — ABNORMAL LOW (ref 3.6–11.0)

## 2017-10-19 LAB — BPAM PLATELET PHERESIS
Blood Product Expiration Date: 201908091430
ISSUE DATE / TIME: 201908091401
Unit Type and Rh: 5100

## 2017-10-19 LAB — HIV ANTIBODY (ROUTINE TESTING W REFLEX): HIV Screen 4th Generation wRfx: NONREACTIVE

## 2017-10-19 MED ORDER — HEPARIN SOD (PORK) LOCK FLUSH 100 UNIT/ML IV SOLN
500.0000 [IU] | Freq: Once | INTRAVENOUS | Status: DC
  Filled 2017-10-19: qty 5

## 2017-10-19 MED ORDER — CLINDAMYCIN HCL 300 MG PO CAPS: 300.0000 mg | ORAL_CAPSULE | Freq: Three times a day (TID) | ORAL | 0 refills | Status: AC

## 2017-10-19 MED ORDER — NICOTINE 21 MG/24HR TD PT24: 21.0000 mg | MEDICATED_PATCH | Freq: Every day | TRANSDERMAL | 0 refills | Status: DC

## 2017-10-19 NOTE — Evaluation (Signed)
Physical Therapy Evaluation Patient Details Name: Shelly Arias MRN: [id number] DOB: Aug 29, 1945 Today's Date: 10/19/2017   History of Present Illness  Patient is a 72 year old female admitted for r LE cellulitis.  PMH includes lung CA, breast CA, squamous cell carcinoma, blood dyscrasia, osteoporosis, Htn, HCL and collapsed lung.  Clinical Impression  Patient is a 72 year old female who lives in a two story home with her husband.  She is independent with use of SPC for safety at baseline.  Pt is able to perform bed mobility and STS transfer independently.  She presented with weakness of UE/LE bilaterally during MMT and was able to sit at EOB with good sitting balance.  Pt was able to ambulate 200 ft with SPC, presenting with good gait mechanics but with mild lateral deviations following 100 ft.  She prefers to keep [facility] with her for fall prevention but reported that she did not feel she needed it during the time of evaluation for balance.  She experienced no significant LOB but reported fatigue.  Pt also reports feeling weakness in LE's during stair negotiation.  Pt will benefit from continued PT with focus on strength, balance and fall prevention and tolerance to activity.  PT discussed benefit of organized, regular exercise offered by the CARE program in the hospital and pt expressed interest.    Follow Up Recommendations Outpatient PT(PT discussed CARE program for cancer patients and pt is more interested in this than OP PT at this time.)    Equipment Recommendations  None recommended by PT    Recommendations for Other Services       Precautions / Restrictions Precautions Precautions: None Restrictions Weight Bearing Restrictions: No      Mobility  Bed Mobility Overal bed mobility: Independent                Transfers Overall transfer level: Independent                  Ambulation/Gait Ambulation/Gait assistance: Supervision Gait Distance (Feet): 200  Feet Assistive device: Straight cane     Gait velocity interpretation: 1.31 - 2.62 ft/sec, indicative of limited community ambulator General Gait Details: Good foot clearance, BOS and step length.  Pt shows signs of fatigue and mild lateral deviations following 100 ft of ambulation.  No significant LOB's or gait deviations.  Stairs            Wheelchair Mobility    Modified Rankin (Stroke Patients Only)       Balance Overall balance assessment: Modified Independent                                           Pertinent Vitals/Pain Pain Assessment: No/denies pain    Home Living Family/patient expects to be discharged to:: Private residence Living Arrangements: Spouse/significant other Available Help at Discharge: Family;Available 24 hours/day Type of Home: House Home Access: Stairs to enter Entrance Stairs-Rails: Can reach both Entrance Stairs-Number of Steps: 3 Home Layout: Two level;Able to live on main level with bedroom/bathroom Home Equipment: Kasandra Knudsen - single point      Prior Function Level of Independence: Independent with assistive device(s)         Comments: able to perform community ambulation with SPC.  Pt does not use cane for balance but rather safety and prevention.     Hand Dominance  Extremity/Trunk Assessment   Upper Extremity Assessment Upper Extremity Assessment: Generalized weakness(Grossly 4-/5 bilaterally.)    Lower Extremity Assessment Lower Extremity Assessment: Generalized weakness(Grossly 4-/5 bilaterally.)    Cervical / Trunk Assessment Cervical / Trunk Assessment: Kyphotic(Slightly kyphotic)  Communication   Communication: No difficulties  Cognition Arousal/Alertness: Awake/alert Behavior During Therapy: WFL for tasks assessed/performed Overall Cognitive Status: Within Functional Limits for tasks assessed                                 General Comments: Pleasant, A&Ox4, follows  commands consistently.      General Comments      Exercises Other Exercises Other Exercises: Discussed benefit of C[facility] program at [facility], frequent exercise and low intensity/high frequency exercise for prevention of deconditioning. x8 min   Assessment/Plan    PT Assessment Patient needs continued PT services  PT Problem List Decreased strength;Decreased activity tolerance       PT Treatment Interventions DME instruction;Therapeutic activities;Gait training;Therapeutic exercise;Patient/family education;Stair training;Balance training;Functional mobility training;Neuromuscular re-education    PT Goals (Current goals can be found in the Care Plan section)  Acute Rehab PT Goals Patient Stated Goal: To be able to walk and navigate stairs without LE weakness. PT Goal Formulation: With patient Time For Goal Achievement: 2017-12-05 Potential to Achieve Goals: Good    Frequency Min 2X/week   Barriers to discharge        Co-evaluation               AM-PAC PT "6 Clicks" Daily Activity  Outcome Measure Difficulty turning over in bed (including adjusting bedclothes, sheets and blankets)?: None Difficulty moving from lying on back to sitting on the side of the bed? : None Difficulty sitting down on and standing up from a chair with arms (e.g., wheelchair, bedside commode, etc,.)?: None Help needed moving to and from a bed to chair (including a wheelchair)?: None Help needed walking in hospital room?: A Little Help needed climbing 3-5 steps with a railing? : A Little 6 Click Score: 22    End of Session Equipment Utilized During Treatment: Gait belt Activity Tolerance: Patient tolerated treatment well Patient left: in bed;with family/visitor present;with call bell/phone within reach Nurse Communication: Mobility status PT Visit Diagnosis: Unsteadiness on feet (R26.81);Muscle weakness (generalized) (M62.81)    Time: 2641-5830 PT Time Calculation (min) (ACUTE ONLY): 25  min   Charges:   PT Evaluation $PT Eval Low Complexity: 1 Low PT Treatments $Therapeutic Activity: 8-22 mins        Roxanne Gates, PT, DPT   Roxanne Gates 10/19/2017, 11:16 AM

## 2017-10-19 NOTE — Discharge Instructions (Signed)
Smoking cessation  

## 2017-10-19 NOTE — Discharge Summary (Signed)
[place]: Shelly [place]    MR#:  [id number]  DATE OF BIRTH:  09-10-1945  DATE OF ADMISSION:  10/17/2017   ADMITTING PHYSICIAN: Epifanio Lesches, MD  DATE OF DISCHARGE: 10/19/2017  PRIMARY CARE PHYSICIAN: [name], MD   ADMISSION DIAGNOSIS:  cellulitis rt lower ext neutopenia anemia hx breast breast ca DISCHARGE DIAGNOSIS:  Active Problems:   Cellulitis  SECONDARY DIAGNOSIS:   Past Medical History:  Diagnosis Date  . Blood dyscrasia   . Breast cancer ([place])    right, lumpectomy, radiation, chemo  . Breast cancer ([place])    Right, 2007  . Breast cancer ([place]) 06/20/2016   INVASIVE DUCTAL CARCINOMA.  . Collapsed lung 02/14/2017   RIGHT  . Complication of anesthesia    PT STATED AFTER BIL HIP SURGERIES SHE STOPPED BREATHING THE NIGHT AFTER SURGERY  . COPD (chronic obstructive pulmonary disease) ([place])   . GERD (gastroesophageal reflux disease)   . Headache   . History of chemotherapy   . History of methicillin resistant staphylococcus aureus (MRSA) 2011  . History of radiation therapy   . Hyperlipidemia   . Hypertension   . Liver cancer ([place]) 05/2016  . Lung cancer ([place]) 05/2016  . Osteoarthritis    right hip  . Osteoporosis   . Squamous cell carcinoma    leg, Followed by Dr. Alvie Heidelberg COURSE:  *Acute right lower extremity cellulitis Failed outpatient management Follow cx, so far negative. She has been treated with IV vanc.  Cellulitis has much improved.  Change to clindamycin for 7 more days per Dr. Rogue Bussing. She is on chronic oral levaquin due to neutropenia.  *Bilateral lower extremity edema, acute Ongoing for 2 months, no history of heart failure, noted history of numerous transfusions in the past No dvt as per doppler. Echo done, pending report.  *Chronic metastatic breast cancer Chemotherapy halted due to severe myelodysplasia syndrome Consult Dr. 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  Platelets increased to 36,000.  Hemoglobin is 8.2.  WBC 3.2.  *Chronic tobacco smoking abuse/dependency  Cessation counseling done for 4 min, ordered nicotine patch.  *COPD without exacerbation  Breathing treatments as needed  *Chronic GERD without esophagitis  PPI daily  * anxiety    Ordered ativan PRN. I discussed with Dr. Rogue Bussing. DISCHARGE CONDITIONS:  Stable, discharge to home today. CONSULTS OBTAINED:   DRUG ALLERGIES:   Allergies  Allergen Reactions  . Codeine Anaphylaxis  . Fish-Derived Products Anaphylaxis  . Morphine Sulfate Anaphylaxis    REACTION: anaphylaxis  . Adhesive [Tape]     PAPER TAPE OK TO USE  . Oxycodone     Nausea and vomiting   DISCHARGE MEDICATIONS:   Allergies as of 10/19/2017      Reactions   Codeine Anaphylaxis   Fish-derived Products Anaphylaxis   Morphine Sulfate Anaphylaxis   REACTION: anaphylaxis   Adhesive [tape]    PAPER TAPE OK TO USE   Oxycodone    Nausea and vomiting      Medication List    TAKE these medications   acyclovir 400 MG tablet Commonly known as:  ZOVIRAX TAKE 1 TABLET BY MOUTH TWICE  A DAY   ALPRAZolam 0.5 MG tablet Commonly known as:  XANAX Take 1 tablet (0.5 mg total) by mouth every 8 (eight) hours as needed for anxiety.   clindamycin 300 MG capsule Commonly known as:  CLEOCIN Take 1 capsule (300 mg total) by mouth 3 (three) times daily for 7 days.   COSAMIN DS 500-400 MG Caps Generic drug:  Glucosamine-Chondroitin Take 1 tablet by mouth every morning.   cyanocobalamin 1000 MCG/ML injection Commonly known as:  (VITAMIN B-12) Inject 1,000 mcg into the muscle every  30 (thirty) days.   fexofenadine 180 MG tablet Commonly known as:  ALLEGRA Take 180 mg by mouth every morning.   fluconazole 200 MG tablet Commonly known as:  DIFLUCAN Take 1 tablet (200 mg total) by mouth daily.   furosemide 20 MG tablet Commonly known as:  LASIX Take 1 tablet (20 mg total) by mouth daily.   letrozole 2.5 MG tablet Commonly known as:  FEMARA TAKE 1 TABLET (2.5 MG TOTAL) BY MOUTH DAILY. ONCE A DAY.   levofloxacin 500 MG tablet Commonly known as:  LEVAQUIN Take 500 mg by mouth daily.   metoprolol succinate 25 MG 24 hr tablet Commonly known as:  TOPROL-XL Take 1 tablet (25 mg total) by mouth daily.   nicotine 21 mg/24hr patch Commonly known as:  NICODERM CQ - dosed in mg/24 hours Place 1 patch (21 mg total) onto the skin daily.   pravastatin 40 MG tablet Commonly known as:  PRAVACHOL Take 1 tablet (40 mg total) by mouth daily.   PROBIOTIC & ACIDOPHILUS EX ST PO Take 1 tablet by mouth daily.   prochlorperazine 10 MG tablet Commonly known as:  COMPAZINE Take 1 tablet (10 mg total) by mouth every 6 (six) hours as needed for nausea or vomiting.   ranitidine 75 MG tablet Commonly known as:  ZANTAC Take 75 mg by mouth daily as needed for heartburn.   Vitamin D (Ergocalciferol) 50000 units Caps capsule Commonly known as:  DRISDOL TAKE 1 CAPSULE (50,000 UNITS TOTAL) BY MOUTH EVERY SATURDAY. IN THE MORNING.        DISCHARGE INSTRUCTIONS:  See AVS.  If you experience worsening of your admission symptoms, develop shortness of breath, life threatening emergency, suicidal or homicidal thoughts you must seek medical attention immediately by calling 911 or calling your MD immediately  if symptoms less severe.  You Must read complete instructions/literature along with all the possible adverse reactions/side effects for all the Medicines you take and that have been prescribed to you. Take any new Medicines after you have completely understood and accpet all the  possible adverse reactions/side effects.   Please note  You were cared for by a hospitalist during your hospital stay. If you have any questions about your discharge medications or the care you received while you were in the hospital after you are discharged, you can call the unit and asked to speak with the hospitalist on call if the hospitalist that took care of you is not available. Once you are discharged, your primary care physician will handle any further medical issues. Please note that NO REFILLS for any discharge medications will be authorized once you are discharged, as it is imperative that you return to your primary care physician (or establish a relationship with a primary care physician if you do not have one) for your aftercare needs so that they can reassess your need for medications and monitor your lab values.    On the day of Discharge:  VITAL SIGNS:  Blood pressure 134/82, pulse (!) 118, temperature 97.7 F (36.5 C), temperature source Oral, resp. rate 17, height 5\' 1"  (1.549 m), weight 57.6 kg, SpO2 99 %. PHYSICAL EXAMINATION:  GENERAL:  71 y.o.-year-old patient lying in the bed with no acute distress.  EYES: Pupils equal, round, reactive to light and accommodation. No scleral icterus. Extraocular muscles intact.  HEENT: Head atraumatic, normocephalic. Oropharynx and nasopharynx clear.  NECK:  Supple, no jugular venous distention. No thyroid enlargement, no tenderness.  LUNGS: Normal breath sounds bilaterally, no wheezing, rales,rhonchi or crepitation. No use of accessory muscles of respiration.  CARDIOVASCULAR: S1, S2 normal. No murmurs, rubs, or gallops.  ABDOMEN: Soft, non-tender, non-distended. Bowel sounds present. No organomegaly or mass.  EXTREMITIES: No pedal edema, cyanosis, or clubbing.  NEUROLOGIC: Cranial nerves II through XII are intact. Muscle strength 5/5 in all extremities. Sensation intact. Gait not checked.  PSYCHIATRIC: The patient is alert and oriented x  3.  SKIN: No obvious rash, lesion, or ulcer.  DATA REVIEW:   CBC Recent Labs  Lab 10/19/17 0417  WBC 3.2*  HGB 8.2*  HCT 23.5*  PLT 36*    Chemistries  Recent Labs  Lab 10/17/17 1851 10/18/17 0613  NA 138 137  K 3.6 3.8  CL 100 101  CO2 31 29  GLUCOSE 134* 123*  BUN 19 18  CREATININE 0.85 0.84  CALCIUM 8.6* 8.7*  AST 37  --   ALT 41  --   ALKPHOS 97  --   BILITOT 0.3  --      Microbiology Results  Results for orders placed or performed during the hospital encounter of 10/17/17  CULTURE, BLOOD (ROUTINE X 2) w Reflex to ID Panel     Status: None (Preliminary result)   Collection Time: 10/17/17  5:47 PM  Result Value Ref Range Status   Specimen Description BLOOD BLOOD LEFT HAND  Final   Special Requests   Final    BOTTLES DRAWN AEROBIC AND ANAEROBIC Blood Culture adequate volume   Culture   Final    NO GROWTH 2 DAYS Performed at [facility], [address] [place], [place] [address]    Report Status PENDING  Incomplete  Blood Cultures x 2 sites     Status: None (Preliminary result)   Collection Time: 10/17/17  5:47 PM  Result Value Ref Range Status   Specimen Description BLOOD LEFT ANTECUBITAL  Final   Special Requests   Final    BOTTLES DRAWN AEROBIC AND ANAEROBIC Blood Culture adequate volume   Culture   Final    NO GROWTH 2 DAYS Performed at [facility], [address], [place], [place] [address]    Report Status PENDING  Incomplete    RADIOLOGY:  No results found.   Management plans discussed with the patient, her husband and they are in agreement.  CODE STATUS: Full Code   TOTAL TIME TAKING CARE OF THIS PATIENT: 36 minutes.    Demetrios Loll M.D on 10/19/2017 at 10:26 AM  Between 7am to 6pm - Pager - [phone]  After 6pm go to [email or web address] - [employer] [facility]  Office  [phone]  CC: Primary care physician; [name], MD   Note: This dictation was prepared with  Dragon dictation along with smaller phrase technology. Any transcriptional errors that result from this process are unintentional.

## 2017-10-19 NOTE — Progress Notes (Signed)
Patient cellulitis improving discussed with Dr. Bridgett Larsson.  Okay to discharge patient on clindamycin for 7 days; hold off Levaquin while patient on clindamycin.  Follow-up in the clinic as planned/Monday labs.   We will plan holding off steroids with further platelet transfusions; patient requesting Ativan for sleep if receiving steroids with transfusions.

## 2017-10-21 ENCOUNTER — Telehealth: Payer: Self-pay | Admitting: *Deleted

## 2017-10-21 ENCOUNTER — Telehealth: Payer: Self-pay

## 2017-10-21 ENCOUNTER — Inpatient Hospital Stay: Payer: Medicare Other

## 2017-10-21 VITALS — BP 124/85 | HR 114 | Temp 97.9°F | Resp 18

## 2017-10-21 DIAGNOSIS — C50811 Malignant neoplasm of overlapping sites of right female breast: Secondary | ICD-10-CM

## 2017-10-21 DIAGNOSIS — D469 Myelodysplastic syndrome, unspecified: Secondary | ICD-10-CM

## 2017-10-21 DIAGNOSIS — Z7689 Persons encountering health services in other specified circumstances: Secondary | ICD-10-CM | POA: Diagnosis not present

## 2017-10-21 DIAGNOSIS — I10 Essential (primary) hypertension: Secondary | ICD-10-CM | POA: Diagnosis not present

## 2017-10-21 DIAGNOSIS — Z9221 Personal history of antineoplastic chemotherapy: Secondary | ICD-10-CM | POA: Diagnosis not present

## 2017-10-21 DIAGNOSIS — Z803 Family history of malignant neoplasm of breast: Secondary | ICD-10-CM | POA: Diagnosis not present

## 2017-10-21 DIAGNOSIS — Z800 Family history of malignant neoplasm of digestive organs: Secondary | ICD-10-CM | POA: Diagnosis not present

## 2017-10-21 DIAGNOSIS — Z170 Estrogen receptor positive status [ER+]: Secondary | ICD-10-CM | POA: Diagnosis not present

## 2017-10-21 DIAGNOSIS — J449 Chronic obstructive pulmonary disease, unspecified: Secondary | ICD-10-CM | POA: Diagnosis not present

## 2017-10-21 DIAGNOSIS — K219 Gastro-esophageal reflux disease without esophagitis: Secondary | ICD-10-CM | POA: Diagnosis not present

## 2017-10-21 DIAGNOSIS — Z85828 Personal history of other malignant neoplasm of skin: Secondary | ICD-10-CM | POA: Diagnosis not present

## 2017-10-21 DIAGNOSIS — D693 Immune thrombocytopenic purpura: Secondary | ICD-10-CM

## 2017-10-21 DIAGNOSIS — Z923 Personal history of irradiation: Secondary | ICD-10-CM | POA: Diagnosis not present

## 2017-10-21 DIAGNOSIS — D696 Thrombocytopenia, unspecified: Secondary | ICD-10-CM | POA: Diagnosis not present

## 2017-10-21 DIAGNOSIS — D649 Anemia, unspecified: Secondary | ICD-10-CM | POA: Diagnosis not present

## 2017-10-21 DIAGNOSIS — E785 Hyperlipidemia, unspecified: Secondary | ICD-10-CM | POA: Diagnosis not present

## 2017-10-21 DIAGNOSIS — Z79811 Long term (current) use of aromatase inhibitors: Secondary | ICD-10-CM | POA: Diagnosis not present

## 2017-10-21 DIAGNOSIS — L03115 Cellulitis of right lower limb: Secondary | ICD-10-CM | POA: Diagnosis not present

## 2017-10-21 DIAGNOSIS — F17210 Nicotine dependence, cigarettes, uncomplicated: Secondary | ICD-10-CM | POA: Diagnosis not present

## 2017-10-21 DIAGNOSIS — M810 Age-related osteoporosis without current pathological fracture: Secondary | ICD-10-CM | POA: Diagnosis not present

## 2017-10-21 DIAGNOSIS — D709 Neutropenia, unspecified: Secondary | ICD-10-CM | POA: Diagnosis not present

## 2017-10-21 DIAGNOSIS — Z79899 Other long term (current) drug therapy: Secondary | ICD-10-CM | POA: Diagnosis not present

## 2017-10-21 LAB — TYPE AND SCREEN
ABO/RH(D): O POS
ANTIBODY SCREEN: POSITIVE
Donor AG Type: NEGATIVE
Unit division: 0
Unit division: 0
Unit division: 0
Unit division: 0

## 2017-10-21 LAB — BPAM RBC
BLOOD PRODUCT EXPIRATION DATE: 201909012359
Blood Product Expiration Date: 201909012359
Blood Product Expiration Date: 201909052359
Blood Product Expiration Date: 201909052359
ISSUE DATE / TIME: 201908090034
UNIT TYPE AND RH: 5100
Unit Type and Rh: 5100
Unit Type and Rh: 5100
Unit Type and Rh: 5100

## 2017-10-21 LAB — CBC WITH DIFFERENTIAL/PLATELET
Basophils Absolute: 0 10*3/uL (ref 0–0.1)
Basophils Relative: 0 %
EOS PCT: 1 %
Eosinophils Absolute: 0 10*3/uL (ref 0–0.7)
HCT: 23.9 % — ABNORMAL LOW (ref 35.0–47.0)
Hemoglobin: 8.3 g/dL — ABNORMAL LOW (ref 12.0–16.0)
LYMPHS ABS: 0.8 10*3/uL — AB (ref 1.0–3.6)
Lymphocytes Relative: 52 %
MCH: 29.7 pg (ref 26.0–34.0)
MCHC: 34.6 g/dL (ref 32.0–36.0)
MCV: 85.6 fL (ref 80.0–100.0)
Monocytes Absolute: 0.1 10*3/uL — ABNORMAL LOW (ref 0.2–0.9)
Monocytes Relative: 3 %
NEUTROS PCT: 44 %
Neutro Abs: 0.7 10*3/uL — ABNORMAL LOW (ref 1.4–6.5)
PLATELETS: 20 10*3/uL — AB (ref 150–400)
RBC: 2.8 MIL/uL — AB (ref 3.80–5.20)
RDW: 13.9 % (ref 11.5–14.5)
WBC: 1.6 10*3/uL — AB (ref 3.6–11.0)

## 2017-10-21 LAB — SAMPLE TO BLOOD BANK

## 2017-10-21 MED ORDER — TBO-FILGRASTIM 480 MCG/0.8ML ~~LOC~~ SOSY
480.0000 ug | PREFILLED_SYRINGE | Freq: Once | SUBCUTANEOUS | Status: AC
  Administered 2017-10-21: 480 ug via SUBCUTANEOUS

## 2017-10-21 NOTE — Telephone Encounter (Signed)
First attempt made to contact patient for TCM, HFU left message for patient to call office.

## 2017-10-21 NOTE — Telephone Encounter (Signed)
Nutrition  Called patient for nutrition follow-up. Left message on mobile voice mail to return call.   Carsyn Taubman B. Zenia Resides, [place], [place] Registered Dietitian [phone] (pager)

## 2017-10-22 ENCOUNTER — Inpatient Hospital Stay: Payer: Medicare Other

## 2017-10-22 ENCOUNTER — Ambulatory Visit: Payer: Medicare Other

## 2017-10-22 LAB — CULTURE, BLOOD (ROUTINE X 2)
Culture: NO GROWTH
Culture: NO GROWTH
Special Requests: ADEQUATE
Special Requests: ADEQUATE

## 2017-10-23 ENCOUNTER — Telehealth: Payer: Self-pay | Admitting: *Deleted

## 2017-10-23 MED ORDER — PANTOPRAZOLE SODIUM 40 MG PO TBEC: 40.0000 mg | DELAYED_RELEASE_TABLET | Freq: Two times a day (BID) | ORAL | 0 refills | Status: DC

## 2017-10-23 NOTE — Telephone Encounter (Signed)
Shelly Arias- recommend Protonix 40 mg BID [1 month]; and then I will re-evaluate. Please send a script if needed. Dr.B

## 2017-10-23 NOTE — Telephone Encounter (Signed)
Patient called and states she has really bad reflux. Asking what she can take. Please advise

## 2017-10-23 NOTE — Telephone Encounter (Signed)
Patient informed of prescription needing to be sent inand she confirmed [facility]. She states she has bee unable to eat due to the pain when she swallows. I told her to try this and if it gets worse or not improvement in 3 day sto call back and she said if it is no better in 3 days she won't be able to tolerate it. I asked that she at least give it 2 days and she agreed.

## 2017-10-24 ENCOUNTER — Other Ambulatory Visit: Payer: Self-pay | Admitting: *Deleted

## 2017-10-24 ENCOUNTER — Inpatient Hospital Stay: Payer: Medicare Other

## 2017-10-24 DIAGNOSIS — D469 Myelodysplastic syndrome, unspecified: Secondary | ICD-10-CM | POA: Diagnosis not present

## 2017-10-24 DIAGNOSIS — F17210 Nicotine dependence, cigarettes, uncomplicated: Secondary | ICD-10-CM | POA: Diagnosis not present

## 2017-10-24 DIAGNOSIS — D696 Thrombocytopenia, unspecified: Secondary | ICD-10-CM | POA: Diagnosis not present

## 2017-10-24 DIAGNOSIS — Z170 Estrogen receptor positive status [ER+]: Secondary | ICD-10-CM | POA: Diagnosis not present

## 2017-10-24 DIAGNOSIS — I10 Essential (primary) hypertension: Secondary | ICD-10-CM | POA: Diagnosis not present

## 2017-10-24 DIAGNOSIS — M810 Age-related osteoporosis without current pathological fracture: Secondary | ICD-10-CM | POA: Diagnosis not present

## 2017-10-24 DIAGNOSIS — Z923 Personal history of irradiation: Secondary | ICD-10-CM | POA: Diagnosis not present

## 2017-10-24 DIAGNOSIS — E785 Hyperlipidemia, unspecified: Secondary | ICD-10-CM | POA: Diagnosis not present

## 2017-10-24 DIAGNOSIS — C50811 Malignant neoplasm of overlapping sites of right female breast: Secondary | ICD-10-CM | POA: Diagnosis not present

## 2017-10-24 DIAGNOSIS — Z79811 Long term (current) use of aromatase inhibitors: Secondary | ICD-10-CM | POA: Diagnosis not present

## 2017-10-24 DIAGNOSIS — Z800 Family history of malignant neoplasm of digestive organs: Secondary | ICD-10-CM | POA: Diagnosis not present

## 2017-10-24 DIAGNOSIS — D649 Anemia, unspecified: Secondary | ICD-10-CM | POA: Diagnosis not present

## 2017-10-24 DIAGNOSIS — J449 Chronic obstructive pulmonary disease, unspecified: Secondary | ICD-10-CM | POA: Diagnosis not present

## 2017-10-24 DIAGNOSIS — Z7689 Persons encountering health services in other specified circumstances: Secondary | ICD-10-CM | POA: Diagnosis not present

## 2017-10-24 DIAGNOSIS — D709 Neutropenia, unspecified: Secondary | ICD-10-CM | POA: Diagnosis not present

## 2017-10-24 DIAGNOSIS — Z9221 Personal history of antineoplastic chemotherapy: Secondary | ICD-10-CM | POA: Diagnosis not present

## 2017-10-24 DIAGNOSIS — Z79899 Other long term (current) drug therapy: Secondary | ICD-10-CM | POA: Diagnosis not present

## 2017-10-24 DIAGNOSIS — K219 Gastro-esophageal reflux disease without esophagitis: Secondary | ICD-10-CM | POA: Diagnosis not present

## 2017-10-24 DIAGNOSIS — L03115 Cellulitis of right lower limb: Secondary | ICD-10-CM | POA: Diagnosis not present

## 2017-10-24 DIAGNOSIS — Z85828 Personal history of other malignant neoplasm of skin: Secondary | ICD-10-CM | POA: Diagnosis not present

## 2017-10-24 DIAGNOSIS — Z803 Family history of malignant neoplasm of breast: Secondary | ICD-10-CM | POA: Diagnosis not present

## 2017-10-24 LAB — CBC WITH DIFFERENTIAL/PLATELET
Basophils Absolute: 0 10*3/uL (ref 0–0.1)
Basophils Relative: 0 %
EOS PCT: 0 %
Eosinophils Absolute: 0 10*3/uL (ref 0–0.7)
HCT: 21.6 % — ABNORMAL LOW (ref 35.0–47.0)
Hemoglobin: 7.5 g/dL — ABNORMAL LOW (ref 12.0–16.0)
LYMPHS ABS: 0.9 10*3/uL — AB (ref 1.0–3.6)
Lymphocytes Relative: 53 %
MCH: 29.6 pg (ref 26.0–34.0)
MCHC: 34.6 g/dL (ref 32.0–36.0)
MCV: 85.5 fL (ref 80.0–100.0)
MONO ABS: 0.1 10*3/uL — AB (ref 0.2–0.9)
Monocytes Relative: 4 %
Neutro Abs: 0.8 10*3/uL — ABNORMAL LOW (ref 1.4–6.5)
Neutrophils Relative %: 43 %
PLATELETS: 6 10*3/uL — AB (ref 150–400)
RBC: 2.52 MIL/uL — ABNORMAL LOW (ref 3.80–5.20)
RDW: 13.6 % (ref 11.5–14.5)
WBC: 1.8 10*3/uL — ABNORMAL LOW (ref 3.6–11.0)

## 2017-10-24 LAB — SAMPLE TO BLOOD BANK

## 2017-10-24 LAB — PREPARE RBC (CROSSMATCH)

## 2017-10-24 NOTE — Progress Notes (Signed)
Spoke with patient. Patient hgb 7.5 and plts are 6. Patient aware that blood will be transfuse tomorrow. Spoke with Malvin Johns  in Blood bank notified of lab orders being released for units to be transfused tomorrow.  Critical phone call of plts called by Lonnie at [phone]. Dr. Rogue Bussing informed at 1115 am

## 2017-10-25 ENCOUNTER — Inpatient Hospital Stay: Payer: Medicare Other

## 2017-10-25 ENCOUNTER — Inpatient Hospital Stay: Payer: Medicare Other | Admitting: Nurse Practitioner

## 2017-10-25 ENCOUNTER — Ambulatory Visit: Payer: Medicare Other

## 2017-10-25 ENCOUNTER — Telehealth: Payer: Self-pay | Admitting: Internal Medicine

## 2017-10-25 VITALS — BP 130/83 | HR 103 | Temp 96.0°F | Resp 16

## 2017-10-25 DIAGNOSIS — D696 Thrombocytopenia, unspecified: Secondary | ICD-10-CM | POA: Diagnosis not present

## 2017-10-25 DIAGNOSIS — D469 Myelodysplastic syndrome, unspecified: Secondary | ICD-10-CM | POA: Diagnosis not present

## 2017-10-25 DIAGNOSIS — D693 Immune thrombocytopenic purpura: Secondary | ICD-10-CM

## 2017-10-25 DIAGNOSIS — Z7689 Persons encountering health services in other specified circumstances: Secondary | ICD-10-CM | POA: Diagnosis not present

## 2017-10-25 DIAGNOSIS — R197 Diarrhea, unspecified: Secondary | ICD-10-CM

## 2017-10-25 DIAGNOSIS — L03115 Cellulitis of right lower limb: Secondary | ICD-10-CM | POA: Diagnosis not present

## 2017-10-25 DIAGNOSIS — C50811 Malignant neoplasm of overlapping sites of right female breast: Secondary | ICD-10-CM

## 2017-10-25 DIAGNOSIS — Z170 Estrogen receptor positive status [ER+]: Secondary | ICD-10-CM

## 2017-10-25 DIAGNOSIS — Z79899 Other long term (current) drug therapy: Secondary | ICD-10-CM | POA: Diagnosis not present

## 2017-10-25 DIAGNOSIS — D649 Anemia, unspecified: Secondary | ICD-10-CM | POA: Diagnosis not present

## 2017-10-25 LAB — GASTROINTESTINAL PANEL BY PCR, STOOL (REPLACES STOOL CULTURE)

## 2017-10-25 LAB — C DIFFICILE QUICK SCREEN W PCR REFLEX
C Diff antigen: NEGATIVE
C Diff interpretation: NOT DETECTED
C Diff toxin: NEGATIVE

## 2017-10-25 MED ORDER — TBO-FILGRASTIM 480 MCG/0.8ML ~~LOC~~ SOSY
480.0000 ug | PREFILLED_SYRINGE | Freq: Once | SUBCUTANEOUS | Status: AC
  Administered 2017-10-25: 480 ug via SUBCUTANEOUS

## 2017-10-25 MED ORDER — DIPHENHYDRAMINE HCL 25 MG PO CAPS
25.0000 mg | ORAL_CAPSULE | Freq: Once | ORAL | Status: AC
  Administered 2017-10-25: 25 mg via ORAL
  Filled 2017-10-25: qty 1

## 2017-10-25 MED ORDER — SODIUM CHLORIDE 0.9% IV SOLUTION
250.0000 mL | Freq: Once | INTRAVENOUS | Status: AC
  Administered 2017-10-25: 250 mL via INTRAVENOUS
  Filled 2017-10-25: qty 250

## 2017-10-25 MED ORDER — FLUCONAZOLE 200 MG PO TABS: 200.0000 mg | ORAL_TABLET | Freq: Every day | ORAL | 0 refills | Status: DC

## 2017-10-25 MED ORDER — HEPARIN SOD (PORK) LOCK FLUSH 100 UNIT/ML IV SOLN
500.0000 [IU] | Freq: Every day | INTRAVENOUS | Status: DC | PRN
  Filled 2017-10-25: qty 5

## 2017-10-25 MED ORDER — SODIUM CHLORIDE 0.9% FLUSH
10.0000 mL | INTRAVENOUS | Status: AC | PRN
  Administered 2017-10-25: 10 mL
  Filled 2017-10-25: qty 10

## 2017-10-25 MED ORDER — ACETAMINOPHEN 325 MG PO TABS
650.0000 mg | ORAL_TABLET | Freq: Once | ORAL | Status: AC
  Administered 2017-10-25: 650 mg via ORAL
  Filled 2017-10-25: qty 2

## 2017-10-25 NOTE — Telephone Encounter (Signed)
Pt c/o dysphagia- on protonix 40 BID; ? esophaguis thrush- diflucan 200 mg/day.  Diarrhea- check c.diff/GI PCR. STOP clindamycin [for LE cellutis]  Follow up [facility] on Monday/cbc/holdtube.

## 2017-10-26 LAB — BPAM PLATELET PHERESIS
Blood Product Expiration Date: 201908161430
ISSUE DATE / TIME: 201908161534
Unit Type and Rh: 5100

## 2017-10-26 LAB — TYPE AND SCREEN
ABO/RH(D): O POS
Antibody Screen: POSITIVE
DONOR AG TYPE: NEGATIVE
UNIT DIVISION: 0

## 2017-10-26 LAB — PREPARE PLATELET PHERESIS: Unit division: 0

## 2017-10-26 LAB — BPAM RBC
Blood Product Expiration Date: 201909012359
ISSUE DATE / TIME: 201908161136
Unit Type and Rh: 5100

## 2017-10-28 ENCOUNTER — Other Ambulatory Visit: Payer: Self-pay

## 2017-10-28 ENCOUNTER — Other Ambulatory Visit: Payer: Self-pay | Admitting: Nurse Practitioner

## 2017-10-28 ENCOUNTER — Inpatient Hospital Stay (HOSPITAL_BASED_OUTPATIENT_CLINIC_OR_DEPARTMENT_OTHER): Payer: Medicare Other | Admitting: Internal Medicine

## 2017-10-28 ENCOUNTER — Inpatient Hospital Stay: Payer: Medicare Other

## 2017-10-28 ENCOUNTER — Other Ambulatory Visit: Payer: Self-pay | Admitting: *Deleted

## 2017-10-28 VITALS — BP 115/78 | HR 109 | Temp 96.7°F | Resp 22 | Ht 61.0 in | Wt 130.0 lb

## 2017-10-28 DIAGNOSIS — Z85828 Personal history of other malignant neoplasm of skin: Secondary | ICD-10-CM

## 2017-10-28 DIAGNOSIS — C50811 Malignant neoplasm of overlapping sites of right female breast: Secondary | ICD-10-CM

## 2017-10-28 DIAGNOSIS — Z803 Family history of malignant neoplasm of breast: Secondary | ICD-10-CM

## 2017-10-28 DIAGNOSIS — F17210 Nicotine dependence, cigarettes, uncomplicated: Secondary | ICD-10-CM

## 2017-10-28 DIAGNOSIS — Z170 Estrogen receptor positive status [ER+]: Secondary | ICD-10-CM

## 2017-10-28 DIAGNOSIS — Z79811 Long term (current) use of aromatase inhibitors: Secondary | ICD-10-CM

## 2017-10-28 DIAGNOSIS — Z800 Family history of malignant neoplasm of digestive organs: Secondary | ICD-10-CM

## 2017-10-28 DIAGNOSIS — Z7689 Persons encountering health services in other specified circumstances: Secondary | ICD-10-CM

## 2017-10-28 DIAGNOSIS — J449 Chronic obstructive pulmonary disease, unspecified: Secondary | ICD-10-CM

## 2017-10-28 DIAGNOSIS — Z923 Personal history of irradiation: Secondary | ICD-10-CM

## 2017-10-28 DIAGNOSIS — K219 Gastro-esophageal reflux disease without esophagitis: Secondary | ICD-10-CM

## 2017-10-28 DIAGNOSIS — D469 Myelodysplastic syndrome, unspecified: Secondary | ICD-10-CM

## 2017-10-28 DIAGNOSIS — L03115 Cellulitis of right lower limb: Secondary | ICD-10-CM

## 2017-10-28 DIAGNOSIS — D649 Anemia, unspecified: Secondary | ICD-10-CM

## 2017-10-28 DIAGNOSIS — M810 Age-related osteoporosis without current pathological fracture: Secondary | ICD-10-CM | POA: Diagnosis not present

## 2017-10-28 DIAGNOSIS — D696 Thrombocytopenia, unspecified: Secondary | ICD-10-CM

## 2017-10-28 DIAGNOSIS — E860 Dehydration: Secondary | ICD-10-CM

## 2017-10-28 DIAGNOSIS — Z79899 Other long term (current) drug therapy: Secondary | ICD-10-CM | POA: Diagnosis not present

## 2017-10-28 DIAGNOSIS — Z95828 Presence of other vascular implants and grafts: Secondary | ICD-10-CM

## 2017-10-28 DIAGNOSIS — D709 Neutropenia, unspecified: Secondary | ICD-10-CM

## 2017-10-28 DIAGNOSIS — Z9221 Personal history of antineoplastic chemotherapy: Secondary | ICD-10-CM

## 2017-10-28 DIAGNOSIS — E785 Hyperlipidemia, unspecified: Secondary | ICD-10-CM

## 2017-10-28 DIAGNOSIS — I10 Essential (primary) hypertension: Secondary | ICD-10-CM

## 2017-10-28 LAB — CBC WITH DIFFERENTIAL/PLATELET
BASOS ABS: 0 10*3/uL (ref 0–0.1)
BASOS PCT: 1 %
EOS ABS: 0 10*3/uL (ref 0–0.7)
EOS PCT: 0 %
HCT: 23.7 % — ABNORMAL LOW (ref 35.0–47.0)
HEMOGLOBIN: 8.1 g/dL — AB (ref 12.0–16.0)
LYMPHS ABS: 0.7 10*3/uL — AB (ref 1.0–3.6)
Lymphocytes Relative: 44 %
MCH: 29.5 pg (ref 26.0–34.0)
MCHC: 34.2 g/dL (ref 32.0–36.0)
MCV: 86.3 fL (ref 80.0–100.0)
Monocytes Absolute: 0.1 10*3/uL — ABNORMAL LOW (ref 0.2–0.9)
Monocytes Relative: 4 %
NEUTROS PCT: 51 %
Neutro Abs: 0.8 10*3/uL — ABNORMAL LOW (ref 1.4–6.5)
PLATELETS: 13 10*3/uL — AB (ref 150–400)
RBC: 2.75 MIL/uL — AB (ref 3.80–5.20)
RDW: 13.8 % (ref 11.5–14.5)
WBC: 1.6 10*3/uL — AB (ref 3.6–11.0)

## 2017-10-28 LAB — SAMPLE TO BLOOD BANK

## 2017-10-28 MED ORDER — HEPARIN SOD (PORK) LOCK FLUSH 100 UNIT/ML IV SOLN
500.0000 [IU] | Freq: Once | INTRAVENOUS | Status: AC
  Administered 2017-10-28: 500 [IU]
  Filled 2017-10-28: qty 5

## 2017-10-28 MED ORDER — MEGESTROL ACETATE 625 MG/5ML PO SUSP: 625.0000 mg | Freq: Two times a day (BID) | ORAL | 0 refills | Status: DC

## 2017-10-28 MED ORDER — SODIUM CHLORIDE 0.9% FLUSH
10.0000 mL | Freq: Once | INTRAVENOUS | Status: AC
  Administered 2017-10-28: 10 mL via INTRAVENOUS
  Filled 2017-10-28: qty 10

## 2017-10-28 MED ORDER — SODIUM CHLORIDE 0.9 % IV SOLN
INTRAVENOUS | Status: DC
  Administered 2017-10-28: 15:00:00 via INTRAVENOUS
  Filled 2017-10-28 (×2): qty 1000

## 2017-10-28 MED ORDER — SUCRALFATE 1 G PO TABS: 1.0000 g | ORAL_TABLET | Freq: Three times a day (TID) | ORAL | 1 refills | Status: DC

## 2017-10-28 MED ORDER — TBO-FILGRASTIM 480 MCG/0.8ML ~~LOC~~ SOSY
480.0000 ug | PREFILLED_SYRINGE | Freq: Once | SUBCUTANEOUS | Status: AC
  Administered 2017-10-28: 480 ug via SUBCUTANEOUS
  Filled 2017-10-28: qty 0.8

## 2017-10-28 NOTE — Progress Notes (Signed)
[place] OFFICE PROGRESS NOTE  Patient Care Team: [name], MD as PCP - General (Family Medicine) [name], DPM as Consulting Physician (Podiatry) [name], MD as Medical Oncologist (Hematology and Oncology) Bary Castilla Forest Gleason, MD (General Surgery)  Cancer Staging No matching staging information was found for the patient.   Oncology History   # April 2018- Left chest wall Bx- IMC; ER-PR-POS; her 2 Neu NEG; PET- mild hilar/distal adenopathy; ~1 cm right lung nodule/ several sub-centimeter.   # May 2018- cont [facility]; Abemacliclib [on HOLD sec to cytopenia]; MARCH 2019- PET near CR from breast cancer  # April 2017-isolated Thrombocytopenia- platelets- 113; worsening PANCYTOPENIA- April 2018- BMBx- no evidence of malignancy; MDS/Acute leuk; Karyotype/MDS- FISH panel-NEG [d/w Dr.Smir];   # June 2018- REPEAT BMBx/[facility] [June 2018-Dr.Foster; Aug 2018- Dr.Powell at [facility]]; No Diagnosis.   # April 11, 2017 [third bone marrow]-is still inconclusive; no obvious evidence of acute leukemia or obvious MDS except for mild dysplastic changes; and significantly reduced megakaryocytes. 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 [reviwed at [facility]; [facility]-NEG]  # April 1st 2019- Dacogen #1- severe pancytopenia  # July 2019-[ 4th BMBx-] shows variable cellularity between 10 to 70%; with scant erythroid and megakaryocytic precursors; left shifted myeloid lineage [likely from growth factor support]; no evidence of any blasts-no conclusive diagnosis noted.  No evidence of metastatic breast cancer noted.  -------------------------------------     # May 29th 2018- N-plate- suboptimal improvement; NOV 1st week start promacta 50 mg/day; suboptimal response; DEC 1st- start promacta 75 mg/day  # DEC 18th-INCREASE PROMACTA to 161m/day  # 2006- RIGHT BREAST CA [[id number]; STAGE  I; ER/PRPos; Her 2 Neu-NEG] s/p FEC x 6 [NSABP B-36]; Femara [Dec 2007-2012]  # Osteoporosis s/p Reclast; BMD- 2015-wnl- ca+vit D   Dr.Stewart [derm ? Squamous cell s/p freeze-oct 2018] --------------------------------------------------------------  DIAGNOSIS: [[name] ] # ? MDS /Meta.Breast cancer- ER/PR pos; her-NEG  STAGE:  IV  ;GOALS: palliative  CURRENT/MOST RECENT THERAPY: dacogen [April 2019]; Letrozole [May 2017];   # Pt needs PLATELETS- WASHED/ CROSS-MATCHED PLATELETS        Carcinoma of overlapping sites of right breast in female, estrogen receptor positive ([place]    [place]myelodysplastic syndrome) ([place]      INTERVAL HISTORY:  [name].  female pleasant patient above history of undiagnosed bone marrow failure syndrome; and also history of metastatic breast cancer ER PR positive on letrozole is here for follow-up.  In the interim patient patient was admitted to hospital for right lower extremity cellulitis; she was treated with vancomycin and discharged home on clindamycin.  Patient noted to have episodes of diarrhea-GI PCR/C. difficile testing negative.  Patient also complained of reflux-started on Prilosec twice a day.  Patient states that diarrhea is improved.  She is not taking Imodium.  Reflux-like symptoms are improved.  However appetite is poor.  Complains of swelling in the legs.  No abdominal pain.  Overall feels poorly.  Extreme fatigue.  Positive weight loss.  Review of Systems  Constitutional: Positive for malaise/fatigue and weight loss. Negative for chills, diaphoresis and fever.  HENT: Negative for nosebleeds and sore throat.   Eyes: Negative for double vision.  Respiratory: Positive for shortness of breath. Negative for cough, hemoptysis, sputum production and wheezing.   Cardiovascular: Positive for leg swelling. Negative for chest pain, palpitations and orthopnea.  Gastrointestinal: Negative for abdominal pain, blood in stool,  constipation, diarrhea, heartburn, melena, nausea and vomiting.  Genitourinary: Negative for dysuria, frequency and urgency.  Musculoskeletal: Negative for back pain and joint pain.  Skin: Negative.  Negative for itching and rash.  Neurological: Negative for dizziness, tingling, focal weakness, weakness and headaches.  Endo/Heme/Allergies: Bruises/bleeds easily.  Psychiatric/Behavioral: Negative for depression. The patient is not nervous/anxious and does not have insomnia.       PAST MEDICAL HISTORY :  Past Medical History:  Diagnosis Date  . Blood dyscrasia   . Breast cancer ([place])    right, lumpectomy, radiation, chemo  . Breast cancer ([place])    Right, 2007  . Breast cancer ([place]) 06/20/2016   INVASIVE DUCTAL CARCINOMA.  . Collapsed lung 02/14/2017   RIGHT  . Complication of anesthesia    PT STATED AFTER BIL HIP SURGERIES SHE STOPPED BREATHING THE NIGHT AFTER SURGERY  . COPD (chronic obstructive pulmonary disease) ([place])   . GERD (gastroesophageal reflux disease)   . Headache   . History of chemotherapy   . History of methicillin resistant staphylococcus aureus (MRSA) 2011  . History of radiation therapy   . Hyperlipidemia   . Hypertension   . Liver cancer ([place]) 05/2016  . Lung cancer ([place]) 05/2016  . Osteoarthritis    right hip  . Osteoporosis   . Squamous cell carcinoma    leg, Followed by Dr. Nicole Kindred    PAST SURGICAL HISTORY :   Past Surgical History:  Procedure Laterality Date  . ABDOMINAL HYSTERECTOMY    . BREAST BIOPSY Left 2002   left breast, calcifications  . BREAST BIOPSY Left 06/20/2016   INVASIVE DUCTAL CARCINOMA.  Marland Kitchen BREAST EXCISIONAL BIOPSY Right 2007   positive  . bunion repair    . ESOPHAGOGASTRODUODENOSCOPY (EGD) WITH PROPOFOL N/A 08/05/2016   Procedure: ESOPHAGOGASTRODUODENOSCOPY (EGD) WITH PROPOFOL;  Surgeon: San Jetty, MD;  Location: [facility] ENDOSCOPY;  Service: General;  Laterality: N/A;  . IR FLUORO GUIDE CV LINE RIGHT  07/25/2017  . JOINT  REPLACEMENT    . left breast biopsy    . NASAL SINUS SURGERY    . PORTACATH PLACEMENT Right 04/13/2017   Procedure: INSERTION PORT-A-CATH- RIGHT INTERNAL JUGULAR;  Surgeon: Robert Bellow, MD;  Location: [facility] ORS;  Service: General;  Laterality: Right;  . right hip replacement    . SHOULDER SURGERY    . SQUAMOUS CELL CARCINOMA EXCISION     right leg, Dr. Nicole Kindred  . TOTAL HIP ARTHROPLASTY     right    FAMILY HISTORY :   Family History  Problem Relation Age of Onset  . Heart disease Father 68  . Heart disease Mother   . Hyperlipidemia Mother   . Heart disease Brother   . Diabetes Brother   . Pancreatic cancer Brother   . Diabetes Maternal Grandmother   . Breast cancer Cousin   . Kidney disease Brother     SOCIAL HISTORY:   Social History   Tobacco Use  . Smoking status: Current Some Day Smoker    Packs/day: 0.25    Years: 30.00    Pack years: 7.50    Types: Cigarettes  . Smokeless tobacco: Never Used  Substance Use Topics  . Alcohol use: Not Currently    Alcohol/week: 0.0 standard drinks    Comment: socially  . Drug use: No    ALLERGIES:  is allergic to codeine; fish-derived products; morphine sulfate; adhesive [tape]; and oxycodone.  MEDICATIONS:  Current Outpatient Medications  Medication Sig Dispense Refill  . acyclovir (ZOVIRAX) 400 MG  tablet TAKE 1 TABLET BY MOUTH TWICE A DAY 60 tablet 3  . ALPRAZolam (XANAX) 0.5 MG tablet Take 1 tablet (0.5 mg total) by mouth every 8 (eight) hours as needed for anxiety. 30 tablet 2  . cyanocobalamin (,VITAMIN B-12,) 1000 MCG/ML injection Inject 1,000 mcg into the muscle every 30 (thirty) days.    . fexofenadine (ALLEGRA) 180 MG tablet Take 180 mg by mouth every morning.     . fluconazole (DIFLUCAN) 200 MG tablet Take 1 tablet (200 mg total) by mouth daily. 7 tablet 0  . Glucosamine-Chondroitin (COSAMIN DS) 500-400 MG CAPS Take 1 tablet by mouth every morning.     Marland Kitchen letrozole (FEMARA) 2.5 MG tablet TAKE 1 TABLET (2.5 MG  TOTAL) BY MOUTH DAILY. ONCE A DAY. 90 tablet 0  . levofloxacin (LEVAQUIN) 500 MG tablet Take 500 mg by mouth daily.    . metoprolol succinate (TOPROL-XL) 25 MG 24 hr tablet Take 1 tablet (25 mg total) by mouth daily. 90 tablet 3  . pantoprazole (PROTONIX) 40 MG tablet Take 1 tablet (40 mg total) by mouth 2 (two) times daily. 60 tablet 0  . Probiotic Product (PROBIOTIC & ACIDOPHILUS EX ST PO) Take 1 tablet by mouth daily.     . ranitidine (ZANTAC) 75 MG tablet Take 75 mg by mouth daily as needed for heartburn.     . Vitamin D, Ergocalciferol, (DRISDOL) 50000 units CAPS capsule TAKE 1 CAPSULE (50,000 UNITS TOTAL) BY MOUTH EVERY SATURDAY. IN THE MORNING. 12 capsule 0  . megestrol (MEGACE ES) 625 MG/5ML suspension Take 5 mLs (625 mg total) by mouth 2 (two) times daily. 150 mL 0  . nicotine (NICODERM CQ - DOSED IN MG/24 HOURS) 21 mg/24hr patch Place 1 patch (21 mg total) onto the skin daily. (Patient not taking: Reported on 10/28/2017) 28 patch 0  . pravastatin (PRAVACHOL) 40 MG tablet Take 1 tablet (40 mg total) by mouth daily. (Patient not taking: Reported on 10/28/2017) 90 tablet 3  . prochlorperazine (COMPAZINE) 10 MG tablet Take 1 tablet (10 mg total) by mouth every 6 (six) hours as needed for nausea or vomiting. (Patient not taking: Reported on 10/28/2017) 30 tablet 2  . sucralfate (CARAFATE) 1 g tablet Take 1 tablet (1 g total) by mouth 4 (four) times daily -  with meals and at bedtime. (Patient not taking: Reported on 10/28/2017) 120 tablet 1   No current facility-administered medications for this visit.    Facility-Administered Medications Ordered in Other Visits  Medication Dose Route Frequency Provider Last Rate Last Dose  . 0.9 %  sodium chloride infusion   Intravenous Continuous [name], MD   Stopped at 06/28/17 1450  . 0.9 %  sodium chloride infusion   Intravenous Continuous [name], MD   Stopped at 07/29/17 1515  . heparin lock flush 100 unit/mL  500 Units  Intravenous Once Charlaine Dalton R, MD      . sodium chloride flush (NS) 0.9 % injection 10 mL  10 mL Intravenous PRN Charlaine Dalton R, MD      . sodium chloride flush (NS) 0.9 % injection 10 mL  10 mL Intravenous PRN Verlon Au, NP      . sodium chloride flush (NS) 0.9 % injection 10 mL  10 mL Intravenous PRN [name], MD   10 mL at 07/29/17 1405    PHYSICAL EXAMINATION: ECOG PERFORMANCE STATUS: 2 - Symptomatic, <50% confined to bed  BP 115/78 (BP Location: Left Arm, Patient Position:  Sitting)   Pulse (!) 109   Temp (!) 96.7 F (35.9 C) (Tympanic)   Resp (!) 22   Ht 5' 1"  (1.549 m)   Wt 130 lb (59 kg)   BMI 24.56 kg/m   Filed Weights   10/28/17 1436  Weight: 130 lb (59 kg)    Physical Exam  Constitutional: She is oriented to person, place, and time.  Thin built poorly nourished female patient.  No acute distress.  Accompanied by family.  HENT:  Head: Normocephalic and atraumatic.  Mouth/Throat: Oropharynx is clear and moist. No oropharyngeal exudate.  Eyes: Pupils are equal, round, and reactive to light.  Neck: Normal range of motion. Neck supple.  Cardiovascular: Normal rate and regular rhythm.  Bilateral lower extremity swelling.  Pulmonary/Chest: No respiratory distress. She has no wheezes.  Abdominal: Soft. Bowel sounds are normal. She exhibits no distension and no mass. There is no tenderness. There is no rebound and no guarding.  Musculoskeletal: Normal range of motion. She exhibits edema. She exhibits no tenderness.  Neurological: She is alert and oriented to person, place, and time.  Skin: Skin is warm. There is pallor.  Psychiatric: Affect normal.       LABORATORY DATA:  I have reviewed the data as listed    Component Value Date/Time   NA 137 10/18/2017 0613   K 3.8 10/18/2017 0613   CL 101 10/18/2017 0613   CO2 29 10/18/2017 0613   GLUCOSE 123 (H) 10/18/2017 0613   BUN 18 10/18/2017 0613   CREATININE 0.84 10/18/2017 0613    CREATININE 0.67 07/09/2014 1321   CREATININE 0.70 04/30/2014 1455   CALCIUM 8.7 (L) 10/18/2017 0613   PROT 5.3 (L) 10/17/2017 1851   PROT 7.3 07/09/2014 1321   ALBUMIN 2.7 (L) 10/17/2017 1851   ALBUMIN 4.3 07/09/2014 1321   AST 37 10/17/2017 1851   AST 19 07/09/2014 1321   ALT 41 10/17/2017 1851   ALT 18 07/09/2014 1321   ALKPHOS 97 10/17/2017 1851   ALKPHOS 66 07/09/2014 1321   BILITOT 0.3 10/17/2017 1851   BILITOT 0.6 07/09/2014 1321   GFRNONAA >60 10/18/2017 0613   GFRNONAA >60 07/09/2014 1321   GFRAA >60 10/18/2017 0613   GFRAA >60 07/09/2014 1321    No results found for: SPEP, UPEP  Lab Results  Component Value Date   WBC 1.6 (L) 10/28/2017   NEUTROABS 0.8 (L) 10/28/2017   HGB 8.1 (L) 10/28/2017   HCT 23.7 (L) 10/28/2017   MCV 86.3 10/28/2017   PLT 13 (LL) 10/28/2017      Chemistry      Component Value Date/Time   NA 137 10/18/2017 0613   K 3.8 10/18/2017 0613   CL 101 10/18/2017 0613   CO2 29 10/18/2017 0613   BUN 18 10/18/2017 0613   CREATININE 0.84 10/18/2017 0613   CREATININE 0.67 07/09/2014 1321   CREATININE 0.70 04/30/2014 1455      Component Value Date/Time   CALCIUM 8.7 (L) 10/18/2017 0613   ALKPHOS 97 10/17/2017 1851   ALKPHOS 66 07/09/2014 1321   AST 37 10/17/2017 1851   AST 19 07/09/2014 1321   ALT 41 10/17/2017 1851   ALT 18 07/09/2014 1321   BILITOT 0.3 10/17/2017 1851   BILITOT 0.6 07/09/2014 1321       RADIOGRAPHIC STUDIES: I have personally reviewed the radiological images as listed and agreed with the findings in the report. No results found.   ASSESSMENT & PLAN:  MDS (myelodysplastic syndrome) ([place]) # Highly  suspicious for MDS-high-grade vs "bone marrow failure"-of unclear etiology currently status post Dacogen [April 1st 2019].  Dacogen on hold.  Worsening anemia/thrombocytopenia neutropenia  #Patient status post multiple bone marrow biopsies without any clear etiology.  Awaiting appointment with [place] hematology, Dr.DeCastro  on September 4.  Unfortunately overall prognosis is poor.  #Severe anemia/severe thrombocytopenia/severe neutropenia-continue supportive therapy with PRBC once a week; crossmatch platelets 1-2 times a week; Granix as needed.  Today hemoglobin is 8.1.  Platelets-13.  Continue Granix today.   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 On Femara.;  PET scan April 2019-stable disease no evidence of any active breast cancer.  Clinically stable.  #Diarrhea-negative for C. difficile/GI PCR.  Improved.  #Bilateral lower extremity swelling-third spacing; recommend leg elevation.  # Reflux-on Prilosec twice a day symptomatically improved.  #Poor appetite/weight loss-recommend Megace.  New prescription sent.  #We will follow-up with me in 2 weeks.  Monday Thursday labs possible transfusion the following day.  Granix as needed if ANC less than 1500.   No orders of the defined types were placed in this encounter.  All questions were answered. The patient knows to call the clinic with any problems, questions or concerns.      [name], MD 10/29/2017 8:15 AM

## 2017-10-28 NOTE — Progress Notes (Signed)
Patient has an apt with [facility] oncology - 11/13/17.

## 2017-10-28 NOTE — Patient Instructions (Signed)
#   STOP Pravachol/pravastatin.

## 2017-10-28 NOTE — Assessment & Plan Note (Addendum)
#  Highly suspicious for MDS-high-grade vs "bone marrow failure"-of unclear etiology currently status post Dacogen [April 1st 2019].  Dacogen on hold.  Worsening anemia/thrombocytopenia neutropenia  #Patient status post multiple bone marrow biopsies without any clear etiology.  Awaiting appointment with [place] hematology, Dr.DeCastro on September 4.  Unfortunately overall prognosis is poor.  #Severe anemia/severe thrombocytopenia/severe neutropenia-continue supportive therapy with PRBC once a week; crossmatch platelets 1-2 times a week; Granix as needed.  Today hemoglobin is 8.1.  Platelets-13.  Continue Granix today.   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 On Femara.;  PET scan April 2019-stable disease no evidence of any active breast cancer.  Clinically stable.  #Diarrhea-negative for C. difficile/GI PCR.  Improved.  #Bilateral lower extremity swelling-third spacing; recommend leg elevation.  # Reflux-on Prilosec twice a day symptomatically improved.  #Poor appetite/weight loss-recommend Megace.  New prescription sent.  #We will follow-up with me in 2 weeks.  Monday Thursday labs possible transfusion the following day.  Granix as needed if ANC less than 1500.

## 2017-10-29 ENCOUNTER — Emergency Department: Payer: Medicare Other

## 2017-10-29 ENCOUNTER — Ambulatory Visit: Payer: Medicare Other

## 2017-10-29 ENCOUNTER — Other Ambulatory Visit: Payer: Self-pay

## 2017-10-29 ENCOUNTER — Inpatient Hospital Stay: Payer: Medicare Other

## 2017-10-29 ENCOUNTER — Inpatient Hospital Stay
Admission: EM | Admit: 2017-10-29 | Discharge: 2017-11-06 | DRG: 280 | Disposition: A | Discharge status: Hospice | Payer: Medicare Other | Attending: Internal Medicine | Admitting: Internal Medicine

## 2017-10-29 DIAGNOSIS — J449 Chronic obstructive pulmonary disease, unspecified: Secondary | ICD-10-CM | POA: Diagnosis present

## 2017-10-29 DIAGNOSIS — C7800 Secondary malignant neoplasm of unspecified lung: Secondary | ICD-10-CM | POA: Diagnosis not present

## 2017-10-29 DIAGNOSIS — Z91048 Other nonmedicinal substance allergy status: Secondary | ICD-10-CM

## 2017-10-29 DIAGNOSIS — I5023 Acute on chronic systolic (congestive) heart failure: Secondary | ICD-10-CM | POA: Diagnosis present

## 2017-10-29 DIAGNOSIS — D469 Myelodysplastic syndrome, unspecified: Secondary | ICD-10-CM | POA: Diagnosis present

## 2017-10-29 DIAGNOSIS — Z515 Encounter for palliative care: Secondary | ICD-10-CM | POA: Diagnosis not present

## 2017-10-29 DIAGNOSIS — D61818 Other pancytopenia: Secondary | ICD-10-CM | POA: Diagnosis not present

## 2017-10-29 DIAGNOSIS — R0789 Other chest pain: Secondary | ICD-10-CM | POA: Diagnosis not present

## 2017-10-29 DIAGNOSIS — Z6824 Body mass index (BMI) 24.0-24.9, adult: Secondary | ICD-10-CM

## 2017-10-29 DIAGNOSIS — Z66 Do not resuscitate: Secondary | ICD-10-CM | POA: Diagnosis present

## 2017-10-29 DIAGNOSIS — Z9221 Personal history of antineoplastic chemotherapy: Secondary | ICD-10-CM

## 2017-10-29 DIAGNOSIS — F17210 Nicotine dependence, cigarettes, uncomplicated: Secondary | ICD-10-CM | POA: Diagnosis present

## 2017-10-29 DIAGNOSIS — C787 Secondary malignant neoplasm of liver and intrahepatic bile duct: Secondary | ICD-10-CM | POA: Diagnosis present

## 2017-10-29 DIAGNOSIS — Z90710 Acquired absence of both cervix and uterus: Secondary | ICD-10-CM

## 2017-10-29 DIAGNOSIS — D702 Other drug-induced agranulocytosis: Secondary | ICD-10-CM

## 2017-10-29 DIAGNOSIS — R627 Adult failure to thrive: Secondary | ICD-10-CM | POA: Diagnosis present

## 2017-10-29 DIAGNOSIS — Z853 Personal history of malignant neoplasm of breast: Secondary | ICD-10-CM

## 2017-10-29 DIAGNOSIS — Z885 Allergy status to narcotic agent status: Secondary | ICD-10-CM

## 2017-10-29 DIAGNOSIS — R079 Chest pain, unspecified: Secondary | ICD-10-CM

## 2017-10-29 DIAGNOSIS — E43 Unspecified severe protein-calorie malnutrition: Secondary | ICD-10-CM | POA: Diagnosis not present

## 2017-10-29 DIAGNOSIS — Z96641 Presence of right artificial hip joint: Secondary | ICD-10-CM | POA: Diagnosis present

## 2017-10-29 DIAGNOSIS — G43909 Migraine, unspecified, not intractable, without status migrainosus: Secondary | ICD-10-CM | POA: Diagnosis present

## 2017-10-29 DIAGNOSIS — Z9981 Dependence on supplemental oxygen: Secondary | ICD-10-CM

## 2017-10-29 DIAGNOSIS — I214 Non-ST elevation (NSTEMI) myocardial infarction: Secondary | ICD-10-CM

## 2017-10-29 DIAGNOSIS — Z841 Family history of disorders of kidney and ureter: Secondary | ICD-10-CM

## 2017-10-29 DIAGNOSIS — I5021 Acute systolic (congestive) heart failure: Secondary | ICD-10-CM

## 2017-10-29 DIAGNOSIS — I130 Hypertensive heart and chronic kidney disease with heart failure and stage 1 through stage 4 chronic kidney disease, or unspecified chronic kidney disease: Secondary | ICD-10-CM | POA: Diagnosis present

## 2017-10-29 DIAGNOSIS — Z8614 Personal history of Methicillin resistant Staphylococcus aureus infection: Secondary | ICD-10-CM

## 2017-10-29 DIAGNOSIS — M8580 Other specified disorders of bone density and structure, unspecified site: Secondary | ICD-10-CM | POA: Diagnosis present

## 2017-10-29 DIAGNOSIS — Z803 Family history of malignant neoplasm of breast: Secondary | ICD-10-CM

## 2017-10-29 DIAGNOSIS — Z833 Family history of diabetes mellitus: Secondary | ICD-10-CM

## 2017-10-29 DIAGNOSIS — D649 Anemia, unspecified: Secondary | ICD-10-CM

## 2017-10-29 DIAGNOSIS — C50811 Malignant neoplasm of overlapping sites of right female breast: Secondary | ICD-10-CM | POA: Diagnosis present

## 2017-10-29 DIAGNOSIS — E785 Hyperlipidemia, unspecified: Secondary | ICD-10-CM | POA: Diagnosis present

## 2017-10-29 DIAGNOSIS — R64 Cachexia: Secondary | ICD-10-CM | POA: Diagnosis not present

## 2017-10-29 DIAGNOSIS — D696 Thrombocytopenia, unspecified: Secondary | ICD-10-CM

## 2017-10-29 DIAGNOSIS — J90 Pleural effusion, not elsewhere classified: Secondary | ICD-10-CM | POA: Diagnosis not present

## 2017-10-29 DIAGNOSIS — Z923 Personal history of irradiation: Secondary | ICD-10-CM

## 2017-10-29 DIAGNOSIS — M810 Age-related osteoporosis without current pathological fracture: Secondary | ICD-10-CM | POA: Diagnosis present

## 2017-10-29 DIAGNOSIS — I447 Left bundle-branch block, unspecified: Secondary | ICD-10-CM | POA: Diagnosis present

## 2017-10-29 DIAGNOSIS — F410 Panic disorder [episodic paroxysmal anxiety] without agoraphobia: Secondary | ICD-10-CM | POA: Diagnosis present

## 2017-10-29 DIAGNOSIS — R778 Other specified abnormalities of plasma proteins: Secondary | ICD-10-CM

## 2017-10-29 DIAGNOSIS — Z800 Family history of malignant neoplasm of digestive organs: Secondary | ICD-10-CM

## 2017-10-29 DIAGNOSIS — R7989 Other specified abnormal findings of blood chemistry: Secondary | ICD-10-CM

## 2017-10-29 DIAGNOSIS — N183 Chronic kidney disease, stage 3 (moderate): Secondary | ICD-10-CM | POA: Diagnosis present

## 2017-10-29 DIAGNOSIS — Z170 Estrogen receptor positive status [ER+]: Principal | ICD-10-CM

## 2017-10-29 DIAGNOSIS — Z8249 Family history of ischemic heart disease and other diseases of the circulatory system: Secondary | ICD-10-CM

## 2017-10-29 DIAGNOSIS — K219 Gastro-esophageal reflux disease without esophagitis: Secondary | ICD-10-CM | POA: Diagnosis present

## 2017-10-29 DIAGNOSIS — Z91013 Allergy to seafood: Secondary | ICD-10-CM

## 2017-10-29 DIAGNOSIS — Z8349 Family history of other endocrine, nutritional and metabolic diseases: Secondary | ICD-10-CM

## 2017-10-29 LAB — CBC
HCT: 23.9 % — ABNORMAL LOW (ref 35.0–47.0)
Hemoglobin: 8.2 g/dL — ABNORMAL LOW (ref 12.0–16.0)
MCH: 29.5 pg (ref 26.0–34.0)
MCHC: 34.3 g/dL (ref 32.0–36.0)
MCV: 86.1 fL (ref 80.0–100.0)
Platelets: 70 10*3/uL — ABNORMAL LOW (ref 150–440)
RBC: 2.78 MIL/uL — ABNORMAL LOW (ref 3.80–5.20)
RDW: 13.9 % (ref 11.5–14.5)
WBC: 1.8 10*3/uL — ABNORMAL LOW (ref 3.6–11.0)

## 2017-10-29 LAB — BASIC METABOLIC PANEL
Anion gap: 9 (ref 5–15)
BUN: 27 mg/dL — AB (ref 8–23)
CHLORIDE: 100 mmol/L (ref 98–111)
CO2: 26 mmol/L (ref 22–32)
CREATININE: 1.11 mg/dL — AB (ref 0.44–1.00)
Calcium: 8.9 mg/dL (ref 8.9–10.3)
GFR calc Af Amer: 57 mL/min — ABNORMAL LOW (ref >60)
GFR calc non Af Amer: 49 mL/min — ABNORMAL LOW (ref >60)
GLUCOSE: 191 mg/dL — AB (ref 70–99)
POTASSIUM: 4.1 mmol/L (ref 3.5–5.1)
Sodium: 135 mmol/L (ref 135–145)

## 2017-10-29 LAB — TROPONIN I: Troponin I: 0.77 ng/mL (ref <0.03)

## 2017-10-29 MED ORDER — SODIUM CHLORIDE 0.9% FLUSH
10.0000 mL | INTRAVENOUS | Status: AC | PRN
  Administered 2017-10-29: 10 mL
  Filled 2017-10-29: qty 10

## 2017-10-29 MED ORDER — LORAZEPAM 2 MG/ML IJ SOLN: 0.5000 mg | Freq: Once | INTRAMUSCULAR | Status: DC

## 2017-10-29 MED ORDER — ACETAMINOPHEN 325 MG PO TABS
650.0000 mg | ORAL_TABLET | Freq: Once | ORAL | Status: AC
  Administered 2017-10-29: 650 mg via ORAL
  Filled 2017-10-29: qty 2

## 2017-10-29 MED ORDER — SODIUM CHLORIDE 0.9% IV SOLUTION
250.0000 mL | Freq: Once | INTRAVENOUS | Status: AC
  Administered 2017-10-29: 250 mL via INTRAVENOUS
  Filled 2017-10-29: qty 250

## 2017-10-29 MED ORDER — FENTANYL CITRATE (PF) 100 MCG/2ML IJ SOLN
50.0000 ug | Freq: Once | INTRAMUSCULAR | Status: AC
  Administered 2017-10-29: 50 ug via INTRAVENOUS
  Filled 2017-10-29 (×2): qty 2

## 2017-10-29 MED ORDER — DIPHENHYDRAMINE HCL 25 MG PO CAPS
25.0000 mg | ORAL_CAPSULE | Freq: Once | ORAL | Status: AC
  Administered 2017-10-29: 25 mg via ORAL
  Filled 2017-10-29: qty 1

## 2017-10-29 MED ORDER — IOPAMIDOL (ISOVUE-370) INJECTION 76%
75.0000 mL | Freq: Once | INTRAVENOUS | Status: AC | PRN
  Administered 2017-10-29: 75 mL via INTRAVENOUS

## 2017-10-29 MED ORDER — HEPARIN SOD (PORK) LOCK FLUSH 100 UNIT/ML IV SOLN
500.0000 [IU] | Freq: Every day | INTRAVENOUS | Status: AC | PRN
  Administered 2017-10-29: 500 [IU]
  Filled 2017-10-29: qty 5

## 2017-10-29 MED ORDER — LORAZEPAM 2 MG/ML IJ SOLN
INTRAMUSCULAR | Status: AC
  Filled 2017-10-29: qty 1

## 2017-10-29 MED ORDER — GI COCKTAIL ~~LOC~~: 30.0000 mL | Freq: Once | ORAL | Status: DC

## 2017-10-29 NOTE — ED Notes (Signed)
Pt reports increase in pain and asking for pain meds at this time.

## 2017-10-29 NOTE — ED Triage Notes (Signed)
cp since 6pm middle of the chest to the between the shoulder blades, pt has a hx of cancer, pt has a port,  pt received platelets today. breathing tx given and one spray of nitro, blood sugar 212

## 2017-10-29 NOTE — ED Notes (Signed)
Pt to ct 

## 2017-10-29 NOTE — ED Notes (Signed)
Patient transported to X-ray 

## 2017-10-29 NOTE — ED Provider Notes (Signed)
[facility] Emergency Department Provider Note   ____________________________________________   I have reviewed the triage vital signs and the nursing notes.   HISTORY  Chief Complaint Chest Pain   History limited by: Not Limited   HPI Shelly Arias is a 72 y.o. female who presents to the emergency department today because of concerns for chest pain.  Located in the center chest.  States it is like pressure feeling.  Patient states that the symptoms started roughly 4 hours prior to presentation.  She did undergo platelet transfusion which is a normal thing for her earlier today.  That however happened about 4 hours prior to the pain starting.  Patient states she has some baseline shortness of breath and did not notice any change in that. The patient felt flushed.    Per medical record review patient has a history of right collapsed lung, breast cancer.  Past Medical History:  Diagnosis Date  . Blood dyscrasia   . Breast cancer ([place])    right, lumpectomy, radiation, chemo  . Breast cancer ([place])    Right, 2007  . Breast cancer ([place]) 06/20/2016   INVASIVE DUCTAL CARCINOMA.  . Collapsed lung 02/14/2017   RIGHT  . Complication of anesthesia    PT STATED AFTER BIL HIP SURGERIES SHE STOPPED BREATHING THE NIGHT AFTER SURGERY  . COPD (chronic obstructive pulmonary disease) ([place])   . GERD (gastroesophageal reflux disease)   . Headache   . History of chemotherapy   . History of methicillin resistant staphylococcus aureus (MRSA) 2011  . History of radiation therapy   . Hyperlipidemia   . Hypertension   . Liver cancer ([place]) 05/2016  . Lung cancer ([place]) 05/2016  . Osteoarthritis    right hip  . Osteoporosis   . Squamous cell carcinoma    leg, Followed by Dr. Nicole Kindred    Patient Active Problem List   Diagnosis Date Noted  . Cellulitis 10/17/2017  . Malnutrition of moderate degree 08/06/2017  . Vaginal bleeding 08/05/2017  . Tachycardia 07/30/2017   . Constipation 12/27/2016  . B12 deficiency 10/09/2016  . 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 GI bleed 08/03/2016  . Thrombocytopenia ([place]) 08/03/2016  . 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 Persistent headaches 07/02/2016  . Mass of upper inner quadrant of left breast 06/20/2016  . Carcinoma of overlapping sites of right breast in female, estrogen receptor positive ([place]) 06/08/2016  . Chronic fatigue 06/08/2016  . Nutritional anemia, unspecified 06/08/2016  . Prediabetes 01/25/2016  . Tobacco abuse 11/06/2012  . History of breast cancer 11/06/2012  . Osteopenia 11/08/2011  . Hyperlipidemia 11/02/2010  . Essential hypertension 12/23/2006  . GERD 12/23/2006    Past Surgical History:  Procedure Laterality Date  . ABDOMINAL HYSTERECTOMY    . BREAST BIOPSY Left 2002   left breast, calcifications  . BREAST BIOPSY Left 06/20/2016   INVASIVE DUCTAL CARCINOMA.  Marland Kitchen BREAST EXCISIONAL BIOPSY Right 2007   positive  . bunion repair    . ESOPHAGOGASTRODUODENOSCOPY (EGD) WITH PROPOFOL N/A 08/05/2016   Procedure: ESOPHAGOGASTRODUODENOSCOPY (EGD) WITH PROPOFOL;  Surgeon: San Jetty, MD;  Location: [facility] ENDOSCOPY;  Service: General;  Laterality: N/A;  . IR FLUORO GUIDE CV LINE RIGHT  07/25/2017  . JOINT REPLACEMENT    . left breast biopsy    . NASAL SINUS SURGERY    . PORTACATH PLACEMENT Right 04/13/2017   Procedure: INSERTION PORT-A-CATH- RIGHT INTERNAL JUGULAR;  Surgeon: Robert Bellow, MD;  Location: [facility] ORS;  Service: General;  Laterality: Right;  . right hip replacement    . SHOULDER SURGERY    . SQUAMOUS CELL CARCINOMA EXCISION     right leg, Dr. Nicole Kindred  . TOTAL HIP ARTHROPLASTY     right    Prior to Admission medications   Medication Sig Start Date End Date Taking? Authorizing Provider  acyclovir (ZOVIRAX) 400 MG tablet TAKE 1 TABLET BY MOUTH TWICE A DAY 10/14/17   [name], MD  ALPRAZolam Duanne Moron) 0.5 MG tablet Take 1 tablet (0.5 mg total) by mouth every  8 (eight) hours as needed for anxiety. 07/30/17   [name], MD  cyanocobalamin (,VITAMIN B-12,) 1000 MCG/ML injection Inject 1,000 mcg into the muscle every 30 (thirty) days.    [provider]  fexofenadine (ALLEGRA) 180 MG tablet Take 180 mg by mouth every morning.     [provider]  fluconazole (DIFLUCAN) 200 MG tablet Take 1 tablet (200 mg total) by mouth daily. 10/25/17   [name], MD  Glucosamine-Chondroitin (COSAMIN DS) 500-400 MG CAPS Take 1 tablet by mouth every morning.     [provider]  letrozole (FEMARA) 2.5 MG tablet TAKE 1 TABLET (2.5 MG TOTAL) BY MOUTH DAILY. ONCE A DAY. 09/18/17   [name], MD  levofloxacin (LEVAQUIN) 500 MG tablet Take 500 mg by mouth daily.    [provider]  megestrol (MEGACE ES) 625 MG/5ML suspension Take 5 mLs (625 mg total) by mouth 2 (two) times daily. 10/28/17   [name], MD  metoprolol succinate (TOPROL-XL) 25 MG 24 hr tablet Take 1 tablet (25 mg total) by mouth daily. 05/02/17   [name], MD  nicotine (NICODERM CQ - DOSED IN MG/24 HOURS) 21 mg/24hr patch Place 1 patch (21 mg total) onto the skin daily. Patient not taking: Reported on 10/28/2017 10/19/17   Demetrios Loll, MD  pantoprazole (PROTONIX) 40 MG tablet Take 1 tablet (40 mg total) by mouth 2 (two) times daily. 10/23/17   [name], MD  pravastatin (PRAVACHOL) 40 MG tablet Take 1 tablet (40 mg total) by mouth daily. Patient not taking: Reported on 10/28/2017 05/02/17   [name], MD  Probiotic Product (PROBIOTIC & ACIDOPHILUS EX ST PO) Take 1 tablet by mouth daily.     [provider]  prochlorperazine (COMPAZINE) 10 MG tablet Take 1 tablet (10 mg total) by mouth every 6 (six) hours as needed for nausea or vomiting. Patient not taking: Reported on 10/28/2017 07/15/17   Verlon Au, NP  ranitidine (ZANTAC) 75 MG tablet Take 75 mg by mouth daily as needed for heartburn.     [provider]  sucralfate (CARAFATE) 1 g tablet Take 1 tablet (1 g total) by mouth 4 (four) times daily -  with meals and at bedtime. Patient not taking: Reported on 10/28/2017 10/28/17   Verlon Au, NP  Vitamin D, Ergocalciferol, (DRISDOL) 50000 units CAPS capsule TAKE 1 CAPSULE (50,000 UNITS TOTAL) BY MOUTH EVERY SATURDAY. IN THE MORNING. 08/12/17   [name], MD    Allergies Codeine; Fish-derived products; Morphine sulfate; Adhesive [tape]; and Oxycodone  Family History  Problem Relation Age of Onset  . Heart disease Father 32  . Heart disease Mother   . Hyperlipidemia Mother   . Heart disease Brother   . Diabetes Brother   . Pancreatic cancer Brother   . Diabetes Maternal Grandmother   . Breast cancer Cousin   . Kidney disease Brother  Social History Social History   Tobacco Use  . Smoking status: Current Some Day Smoker    Packs/day: 0.25    Years: 30.00    Pack years: 7.50    Types: Cigarettes  . Smokeless tobacco: Never Used  Substance Use Topics  . Alcohol use: Not Currently    Alcohol/week: 0.0 standard drinks    Comment: socially  . Drug use: No    Review of Systems Constitutional: No fever/chills Eyes: No visual changes. ENT: No sore throat. Cardiovascular: Positive for chest pain. Respiratory: Positive for shortness of breath. Gastrointestinal: No abdominal pain.  No nausea, no vomiting.  No diarrhea.   Genitourinary: Negative for dysuria. Musculoskeletal: Negative for back pain. Skin: Negative for rash. Neurological: Negative for headaches, focal weakness or numbness.  ____________________________________________   PHYSICAL EXAM:  VITAL SIGNS: ED Triage Vitals  Enc Vitals Group     BP 10/29/17 2103 136/88     Pulse Rate 10/29/17 2100 (!) 128     Resp 10/29/17 2103 (!) 22     Temp 10/29/17 2103 98.1 F (36.7 C)     Temp Source 10/29/17 2058 Oral     SpO2 10/29/17 2103 96 %     Weight 10/29/17 2059 128 lb (58.1 kg)      Height 10/29/17 2059 5\' 1"  (1.549 m)     Head Circumference --      Peak Flow --      Pain Score 10/29/17 2058 8   Constitutional: Alert and oriented.  Eyes: Conjunctivae are normal.  ENT      Head: Normocephalic and atraumatic.      Nose: No congestion/rhinnorhea.      Mouth/Throat: Mucous membranes are moist.      Neck: No stridor. Hematological/Lymphatic/Immunilogical: No cervical lymphadenopathy. Cardiovascular: Normal rate, regular rhythm.  No murmurs, rubs, or gallops.  Respiratory: Normal respiratory effort without tachypnea nor retractions. Breath sounds are clear and equal bilaterally. No wheezes/rales/rhonchi. Gastrointestinal: Soft and non tender. No rebound. No guarding.  Genitourinary: Deferred Musculoskeletal: Normal range of motion in all extremities. No lower extremity edema. Neurologic:  Normal speech and language. No gross focal neurologic deficits are appreciated.  Skin:  Skin is warm, dry and intact. No rash noted. Psychiatric: Mood and affect are normal. Speech and behavior are normal. Patient exhibits appropriate insight and judgment.  ____________________________________________    LABS (pertinent positives/negatives)  Trop 0.77 CBC wbc 1.8, hgb 8.2, plt 70 BMP na 135, k 4.1, cr 1.11  ____________________________________________   EKG  I, Nance Pear, attending physician, personally viewed and interpreted this EKG  EKG Time: 2109 Rate: 126 Rhythm: sinus tachycardia Axis: normal Intervals: qtc 517 QRS: q waves v1, v2 ST changes: no st elevation Impression: abnormal ekg   ____________________________________________    RADIOLOGY  CXR Pleural effusions and concern for pneumonia  CT angio No pe ____________________________________________   PROCEDURES  Procedures  CRITICAL CARE Performed by: Nance Pear   Total critical care time: 30 minutes  Critical care time was exclusive of separately billable procedures and  treating other patients.  Critical care was necessary to treat or prevent imminent or life-threatening deterioration.  Critical care was time spent personally by me on the following activities: development of treatment plan with patient and/or surrogate as well as nursing, discussions with consultants, evaluation of patient's response to treatment, examination of patient, obtaining history from patient or surrogate, ordering and performing treatments and interventions, ordering and review of laboratory studies, ordering and review of radiographic studies, pulse  oximetry and re-evaluation of patient's condition.  ____________________________________________   INITIAL IMPRESSION / ASSESSMENT AND PLAN / ED COURSE  Pertinent labs & imaging results that were available during my care of the patient were reviewed by me and considered in my medical decision making (see chart for details).   Patient presented to the emergency department today because of concern for chest pain. Patient has metastatic cancer.  Differential would include PE, pneumothorax, pulmonary embolism, ACS amongst other etiologies.  Patient's troponin was found to be elevated 0.77.  Given the concern for possible blood clot causing this elevation PE study was ordered.  This did not show a blood clot.  I do have concerns for ACS.  I am somewhat hesitant to start antiplatelet therapy given patient's history of platelet disorder.  Patient felt better after fentanyl.  Discussed findings and plan with patient.  Discussed with hospitalist for admission.  ____________________________________________   FINAL CLINICAL IMPRESSION(S) / ED DIAGNOSES  Final diagnoses:  Chest pain, unspecified type  Elevated troponin     Note: This dictation was prepared with Dragon dictation. Any transcriptional errors that result from this process are unintentional     Nance Pear, MD 10/29/17 2359

## 2017-10-29 NOTE — ED Notes (Signed)
Pt declined fentanyl due to pt sensitivity to opioids. Pt asked for ativan due to being fidgety. Will ask MD.

## 2017-10-30 ENCOUNTER — Encounter: Payer: Self-pay | Admitting: Internal Medicine

## 2017-10-30 ENCOUNTER — Other Ambulatory Visit: Payer: Self-pay

## 2017-10-30 DIAGNOSIS — C50811 Malignant neoplasm of overlapping sites of right female breast: Secondary | ICD-10-CM | POA: Diagnosis present

## 2017-10-30 DIAGNOSIS — R079 Chest pain, unspecified: Secondary | ICD-10-CM | POA: Diagnosis present

## 2017-10-30 DIAGNOSIS — D619 Aplastic anemia, unspecified: Secondary | ICD-10-CM

## 2017-10-30 DIAGNOSIS — I509 Heart failure, unspecified: Secondary | ICD-10-CM

## 2017-10-30 DIAGNOSIS — M1611 Unilateral primary osteoarthritis, right hip: Secondary | ICD-10-CM | POA: Diagnosis not present

## 2017-10-30 DIAGNOSIS — Z515 Encounter for palliative care: Secondary | ICD-10-CM | POA: Diagnosis not present

## 2017-10-30 DIAGNOSIS — D469 Myelodysplastic syndrome, unspecified: Secondary | ICD-10-CM | POA: Diagnosis present

## 2017-10-30 DIAGNOSIS — D696 Thrombocytopenia, unspecified: Secondary | ICD-10-CM | POA: Diagnosis not present

## 2017-10-30 DIAGNOSIS — R748 Abnormal levels of other serum enzymes: Secondary | ICD-10-CM | POA: Diagnosis not present

## 2017-10-30 DIAGNOSIS — I447 Left bundle-branch block, unspecified: Secondary | ICD-10-CM | POA: Diagnosis present

## 2017-10-30 DIAGNOSIS — E785 Hyperlipidemia, unspecified: Secondary | ICD-10-CM

## 2017-10-30 DIAGNOSIS — K219 Gastro-esophageal reflux disease without esophagitis: Secondary | ICD-10-CM

## 2017-10-30 DIAGNOSIS — I208 Other forms of angina pectoris: Secondary | ICD-10-CM | POA: Diagnosis not present

## 2017-10-30 DIAGNOSIS — R64 Cachexia: Secondary | ICD-10-CM | POA: Diagnosis present

## 2017-10-30 DIAGNOSIS — I110 Hypertensive heart disease with heart failure: Secondary | ICD-10-CM | POA: Diagnosis not present

## 2017-10-30 DIAGNOSIS — J449 Chronic obstructive pulmonary disease, unspecified: Secondary | ICD-10-CM

## 2017-10-30 DIAGNOSIS — Z85828 Personal history of other malignant neoplasm of skin: Secondary | ICD-10-CM

## 2017-10-30 DIAGNOSIS — E43 Unspecified severe protein-calorie malnutrition: Secondary | ICD-10-CM | POA: Diagnosis present

## 2017-10-30 DIAGNOSIS — C787 Secondary malignant neoplasm of liver and intrahepatic bile duct: Secondary | ICD-10-CM

## 2017-10-30 DIAGNOSIS — M1990 Unspecified osteoarthritis, unspecified site: Secondary | ICD-10-CM

## 2017-10-30 DIAGNOSIS — M810 Age-related osteoporosis without current pathological fracture: Secondary | ICD-10-CM | POA: Diagnosis present

## 2017-10-30 DIAGNOSIS — Z7401 Bed confinement status: Secondary | ICD-10-CM | POA: Diagnosis not present

## 2017-10-30 DIAGNOSIS — Z170 Estrogen receptor positive status [ER+]: Secondary | ICD-10-CM

## 2017-10-30 DIAGNOSIS — R404 Transient alteration of awareness: Secondary | ICD-10-CM | POA: Diagnosis not present

## 2017-10-30 DIAGNOSIS — M898X9 Other specified disorders of bone, unspecified site: Secondary | ICD-10-CM | POA: Diagnosis not present

## 2017-10-30 DIAGNOSIS — D61818 Other pancytopenia: Secondary | ICD-10-CM | POA: Diagnosis present

## 2017-10-30 DIAGNOSIS — D693 Immune thrombocytopenic purpura: Secondary | ICD-10-CM | POA: Diagnosis not present

## 2017-10-30 DIAGNOSIS — K922 Gastrointestinal hemorrhage, unspecified: Secondary | ICD-10-CM | POA: Diagnosis not present

## 2017-10-30 DIAGNOSIS — R630 Anorexia: Secondary | ICD-10-CM | POA: Diagnosis not present

## 2017-10-30 DIAGNOSIS — Z7189 Other specified counseling: Secondary | ICD-10-CM | POA: Diagnosis not present

## 2017-10-30 DIAGNOSIS — F410 Panic disorder [episodic paroxysmal anxiety] without agoraphobia: Secondary | ICD-10-CM | POA: Diagnosis present

## 2017-10-30 DIAGNOSIS — F17210 Nicotine dependence, cigarettes, uncomplicated: Secondary | ICD-10-CM | POA: Diagnosis present

## 2017-10-30 DIAGNOSIS — D613 Idiopathic aplastic anemia: Secondary | ICD-10-CM | POA: Diagnosis not present

## 2017-10-30 DIAGNOSIS — I5020 Unspecified systolic (congestive) heart failure: Secondary | ICD-10-CM | POA: Diagnosis not present

## 2017-10-30 DIAGNOSIS — R0602 Shortness of breath: Secondary | ICD-10-CM | POA: Diagnosis not present

## 2017-10-30 DIAGNOSIS — I5023 Acute on chronic systolic (congestive) heart failure: Secondary | ICD-10-CM

## 2017-10-30 DIAGNOSIS — Z923 Personal history of irradiation: Secondary | ICD-10-CM

## 2017-10-30 DIAGNOSIS — Z136 Encounter for screening for cardiovascular disorders: Secondary | ICD-10-CM | POA: Diagnosis not present

## 2017-10-30 DIAGNOSIS — C7800 Secondary malignant neoplasm of unspecified lung: Secondary | ICD-10-CM | POA: Diagnosis present

## 2017-10-30 DIAGNOSIS — Z79811 Long term (current) use of aromatase inhibitors: Secondary | ICD-10-CM | POA: Diagnosis not present

## 2017-10-30 DIAGNOSIS — Z90710 Acquired absence of both cervix and uterus: Secondary | ICD-10-CM | POA: Diagnosis not present

## 2017-10-30 DIAGNOSIS — D704 Cyclic neutropenia: Secondary | ICD-10-CM | POA: Diagnosis not present

## 2017-10-30 DIAGNOSIS — M898X8 Other specified disorders of bone, other site: Secondary | ICD-10-CM | POA: Diagnosis not present

## 2017-10-30 DIAGNOSIS — I130 Hypertensive heart and chronic kidney disease with heart failure and stage 1 through stage 4 chronic kidney disease, or unspecified chronic kidney disease: Secondary | ICD-10-CM | POA: Diagnosis present

## 2017-10-30 DIAGNOSIS — Z8614 Personal history of Methicillin resistant Staphylococcus aureus infection: Secondary | ICD-10-CM | POA: Diagnosis not present

## 2017-10-30 DIAGNOSIS — C50919 Malignant neoplasm of unspecified site of unspecified female breast: Secondary | ICD-10-CM | POA: Diagnosis not present

## 2017-10-30 DIAGNOSIS — I420 Dilated cardiomyopathy: Secondary | ICD-10-CM | POA: Diagnosis not present

## 2017-10-30 DIAGNOSIS — M899 Disorder of bone, unspecified: Secondary | ICD-10-CM | POA: Diagnosis not present

## 2017-10-30 DIAGNOSIS — I214 Non-ST elevation (NSTEMI) myocardial infarction: Secondary | ICD-10-CM | POA: Diagnosis present

## 2017-10-30 DIAGNOSIS — Z66 Do not resuscitate: Secondary | ICD-10-CM

## 2017-10-30 DIAGNOSIS — C7951 Secondary malignant neoplasm of bone: Secondary | ICD-10-CM | POA: Diagnosis not present

## 2017-10-30 DIAGNOSIS — G43909 Migraine, unspecified, not intractable, without status migrainosus: Secondary | ICD-10-CM | POA: Diagnosis present

## 2017-10-30 DIAGNOSIS — R5383 Other fatigue: Secondary | ICD-10-CM | POA: Diagnosis not present

## 2017-10-30 DIAGNOSIS — R05 Cough: Secondary | ICD-10-CM | POA: Diagnosis not present

## 2017-10-30 DIAGNOSIS — I21A1 Myocardial infarction type 2: Secondary | ICD-10-CM | POA: Diagnosis not present

## 2017-10-30 DIAGNOSIS — R531 Weakness: Secondary | ICD-10-CM | POA: Diagnosis not present

## 2017-10-30 DIAGNOSIS — Z9981 Dependence on supplemental oxygen: Secondary | ICD-10-CM | POA: Diagnosis not present

## 2017-10-30 DIAGNOSIS — R51 Headache: Secondary | ICD-10-CM | POA: Diagnosis not present

## 2017-10-30 DIAGNOSIS — R634 Abnormal weight loss: Secondary | ICD-10-CM

## 2017-10-30 DIAGNOSIS — N183 Chronic kidney disease, stage 3 (moderate): Secondary | ICD-10-CM | POA: Diagnosis present

## 2017-10-30 DIAGNOSIS — E538 Deficiency of other specified B group vitamins: Secondary | ICD-10-CM | POA: Diagnosis not present

## 2017-10-30 DIAGNOSIS — Z96641 Presence of right artificial hip joint: Secondary | ICD-10-CM | POA: Diagnosis present

## 2017-10-30 DIAGNOSIS — I5021 Acute systolic (congestive) heart failure: Secondary | ICD-10-CM | POA: Diagnosis not present

## 2017-10-30 DIAGNOSIS — D702 Other drug-induced agranulocytosis: Secondary | ICD-10-CM | POA: Diagnosis not present

## 2017-10-30 DIAGNOSIS — M8580 Other specified disorders of bone density and structure, unspecified site: Secondary | ICD-10-CM | POA: Diagnosis present

## 2017-10-30 DIAGNOSIS — I10 Essential (primary) hypertension: Secondary | ICD-10-CM

## 2017-10-30 LAB — TROPONIN I
Troponin I: 0.73 ng/mL (ref <0.03)
Troponin I: 3.26 ng/mL (ref <0.03)
Troponin I: 5.48 ng/mL (ref <0.03)

## 2017-10-30 LAB — PREALBUMIN: Prealbumin: 14 mg/dL — ABNORMAL LOW (ref 18–38)

## 2017-10-30 LAB — PHOSPHORUS: PHOSPHORUS: 3.7 mg/dL (ref 2.5–4.6)

## 2017-10-30 LAB — HEPATIC FUNCTION PANEL
ALT: 100 U/L — ABNORMAL HIGH (ref 0–44)
AST: 104 U/L — AB (ref 15–41)
Albumin: 2.8 g/dL — ABNORMAL LOW (ref 3.5–5.0)
Alkaline Phosphatase: 96 U/L (ref 38–126)
BILIRUBIN DIRECT: 0.3 mg/dL — AB (ref 0.0–0.2)
BILIRUBIN TOTAL: 0.9 mg/dL (ref 0.3–1.2)
Indirect Bilirubin: 0.6 mg/dL (ref 0.3–0.9)
Total Protein: 5.4 g/dL — ABNORMAL LOW (ref 6.5–8.1)

## 2017-10-30 LAB — MAGNESIUM: Magnesium: 1.7 mg/dL (ref 1.7–2.4)

## 2017-10-30 LAB — BRAIN NATRIURETIC PEPTIDE: B Natriuretic Peptide: 2675 pg/mL — ABNORMAL HIGH (ref 0.0–100.0)

## 2017-10-30 MED ORDER — SENNOSIDES-DOCUSATE SODIUM 8.6-50 MG PO TABS: 1.0000 | ORAL_TABLET | Freq: Every evening | ORAL | Status: DC | PRN

## 2017-10-30 MED ORDER — MORPHINE SULFATE (PF) 2 MG/ML IV SOLN
1.0000 mg | INTRAVENOUS | Status: DC | PRN
  Administered 2017-10-30 – 2017-11-01 (×2): 2 mg via INTRAVENOUS
  Filled 2017-10-30 (×2): qty 1

## 2017-10-30 MED ORDER — NICOTINE 21 MG/24HR TD PT24
21.0000 mg | MEDICATED_PATCH | Freq: Every day | TRANSDERMAL | Status: DC
  Administered 2017-10-30 – 2017-11-06 (×10): 21 mg via TRANSDERMAL
  Filled 2017-10-30 (×9): qty 1

## 2017-10-30 MED ORDER — DIPHENHYDRAMINE HCL 25 MG PO CAPS
25.0000 mg | ORAL_CAPSULE | Freq: Every evening | ORAL | Status: DC | PRN
  Administered 2017-10-31 – 2017-11-04 (×3): 25 mg via ORAL
  Filled 2017-10-30 (×3): qty 1

## 2017-10-30 MED ORDER — ALPRAZOLAM 0.5 MG PO TABS
0.5000 mg | ORAL_TABLET | Freq: Three times a day (TID) | ORAL | Status: DC | PRN
  Administered 2017-10-30 – 2017-11-02 (×4): 0.5 mg via ORAL
  Filled 2017-10-30 (×4): qty 1

## 2017-10-30 MED ORDER — PROMETHAZINE HCL 25 MG/ML IJ SOLN: 12.5000 mg | Freq: Four times a day (QID) | INTRAMUSCULAR | Status: DC | PRN

## 2017-10-30 MED ORDER — FLUCONAZOLE 100 MG PO TABS
200.0000 mg | ORAL_TABLET | Freq: Every day | ORAL | Status: DC
  Administered 2017-10-30: 200 mg via ORAL
  Filled 2017-10-30: qty 2

## 2017-10-30 MED ORDER — ASPIRIN EC 81 MG PO TBEC: 81.0000 mg | DELAYED_RELEASE_TABLET | Freq: Every day | ORAL | Status: DC

## 2017-10-30 MED ORDER — FLUCONAZOLE 100 MG PO TABS
100.0000 mg | ORAL_TABLET | Freq: Every day | ORAL | Status: DC
  Administered 2017-10-31 – 2017-11-05 (×5): 100 mg via ORAL
  Filled 2017-10-30 (×7): qty 1

## 2017-10-30 MED ORDER — FAMOTIDINE 20 MG PO TABS: 20.0000 mg | ORAL_TABLET | Freq: Two times a day (BID) | ORAL | Status: DC | PRN

## 2017-10-30 MED ORDER — LEVOFLOXACIN 500 MG PO TABS
250.0000 mg | ORAL_TABLET | Freq: Every day | ORAL | Status: DC
Start: 2017-10-31 — End: 2017-11-02
  Administered 2017-10-31 – 2017-11-02 (×3): 250 mg via ORAL
  Filled 2017-10-30 (×3): qty 1

## 2017-10-30 MED ORDER — PANTOPRAZOLE SODIUM 40 MG PO TBEC
40.0000 mg | DELAYED_RELEASE_TABLET | Freq: Two times a day (BID) | ORAL | Status: DC
  Administered 2017-10-30 – 2017-11-06 (×15): 40 mg via ORAL
  Filled 2017-10-30 (×15): qty 1

## 2017-10-30 MED ORDER — IPRATROPIUM-ALBUTEROL 0.5-2.5 (3) MG/3ML IN SOLN
3.0000 mL | Freq: Once | RESPIRATORY_TRACT | Status: AC
  Administered 2017-10-30: 3 mL via RESPIRATORY_TRACT
  Filled 2017-10-30: qty 3

## 2017-10-30 MED ORDER — GUAIFENESIN ER 600 MG PO TB12
1200.0000 mg | ORAL_TABLET | Freq: Two times a day (BID) | ORAL | Status: DC | PRN
  Administered 2017-11-01 – 2017-11-06 (×5): 1200 mg via ORAL
  Filled 2017-10-30 (×6): qty 2

## 2017-10-30 MED ORDER — FUROSEMIDE 10 MG/ML IJ SOLN
20.0000 mg | Freq: Two times a day (BID) | INTRAMUSCULAR | Status: DC
  Administered 2017-10-30 – 2017-11-02 (×7): 20 mg via INTRAVENOUS
  Filled 2017-10-30: qty 2
  Filled 2017-10-30: qty 4
  Filled 2017-10-30 (×5): qty 2

## 2017-10-30 MED ORDER — LETROZOLE 2.5 MG PO TABS
2.5000 mg | ORAL_TABLET | Freq: Every day | ORAL | Status: DC
  Administered 2017-10-30 – 2017-11-05 (×7): 2.5 mg via ORAL
  Filled 2017-10-30 (×7): qty 1

## 2017-10-30 MED ORDER — IPRATROPIUM-ALBUTEROL 0.5-2.5 (3) MG/3ML IN SOLN
3.0000 mL | RESPIRATORY_TRACT | Status: DC | PRN
  Administered 2017-10-30 – 2017-10-31 (×2): 3 mL via RESPIRATORY_TRACT
  Filled 2017-10-30 (×2): qty 3

## 2017-10-30 MED ORDER — ACETAMINOPHEN 325 MG PO TABS
650.0000 mg | ORAL_TABLET | ORAL | Status: DC | PRN
  Administered 2017-10-30 – 2017-10-31 (×2): 650 mg via ORAL
  Filled 2017-10-30 (×2): qty 2

## 2017-10-30 MED ORDER — BISACODYL 5 MG PO TBEC
5.0000 mg | DELAYED_RELEASE_TABLET | Freq: Every day | ORAL | Status: DC | PRN
  Filled 2017-10-30: qty 1

## 2017-10-30 MED ORDER — METOPROLOL SUCCINATE ER 25 MG PO TB24
25.0000 mg | ORAL_TABLET | Freq: Every day | ORAL | Status: DC
  Administered 2017-10-30: 25 mg via ORAL
  Filled 2017-10-30: qty 1

## 2017-10-30 MED ORDER — LEVOFLOXACIN 500 MG PO TABS
500.0000 mg | ORAL_TABLET | Freq: Every day | ORAL | Status: DC
  Administered 2017-10-30: 500 mg via ORAL
  Filled 2017-10-30: qty 1

## 2017-10-30 MED ORDER — ONDANSETRON HCL 4 MG/2ML IJ SOLN: 4.0000 mg | Freq: Four times a day (QID) | INTRAMUSCULAR | Status: DC | PRN

## 2017-10-30 MED ORDER — SUCRALFATE 1 G PO TABS
1.0000 g | ORAL_TABLET | Freq: Three times a day (TID) | ORAL | Status: DC
  Administered 2017-10-30 – 2017-10-31 (×5): 1 g via ORAL
  Filled 2017-10-30 (×5): qty 1

## 2017-10-30 MED ORDER — NITROGLYCERIN 0.4 MG SL SUBL: 0.4000 mg | SUBLINGUAL_TABLET | SUBLINGUAL | Status: DC | PRN

## 2017-10-30 MED ORDER — ALUM & MAG HYDROXIDE-SIMETH 200-200-20 MG/5ML PO SUSP: 15.0000 mL | ORAL | Status: DC | PRN

## 2017-10-30 MED ORDER — PRAVASTATIN SODIUM 40 MG PO TABS
40.0000 mg | ORAL_TABLET | Freq: Every day | ORAL | Status: DC
  Administered 2017-10-30 – 2017-11-05 (×7): 40 mg via ORAL
  Filled 2017-10-30 (×7): qty 1

## 2017-10-30 MED ORDER — ENSURE ENLIVE PO LIQD
237.0000 mL | Freq: Three times a day (TID) | ORAL | Status: DC
  Administered 2017-10-30 (×2): 237 mL via ORAL

## 2017-10-30 MED ORDER — ACYCLOVIR 200 MG PO CAPS
400.0000 mg | ORAL_CAPSULE | Freq: Two times a day (BID) | ORAL | Status: DC
  Administered 2017-10-30 – 2017-11-05 (×12): 400 mg via ORAL
  Filled 2017-10-30 (×15): qty 2

## 2017-10-30 MED ORDER — MEGESTROL ACETATE 400 MG/10ML PO SUSP
400.0000 mg | Freq: Two times a day (BID) | ORAL | Status: DC
  Administered 2017-10-30 – 2017-11-06 (×13): 400 mg via ORAL
  Filled 2017-10-30 (×17): qty 10

## 2017-10-30 NOTE — ED Notes (Signed)
Patient assisted to ambulate to restroom. Patient voided and repositioned in bed and reattached to monitor.

## 2017-10-30 NOTE — ED Notes (Addendum)
This RN spoke with MD regarding repeat Trop 5.48. Per admitting MD do not give patient food at this time. This RN to bedside to speak with patient and family. Pt resting in bed asleep, remains on 2L via [place]. Delay explained and apologized for. Pt and family state understanding. Will continue to monitor for further patient needs.

## 2017-10-30 NOTE — ED Notes (Signed)
Troponin and blood collected by this RN. Pt tolerated well. Will continue to monitor for further patient needs.

## 2017-10-30 NOTE — ED Notes (Signed)
Family at bedside. 

## 2017-10-30 NOTE — Progress Notes (Addendum)
Family Meeting Note  Advance Directive: No.  Today a meeting took place with: Patient and family (husband, son, daughter).  Patient is able to participate.  The following clinical team members were present during this meeting: MD.  The following were discussed: Patient's diagnosis: Pancytopenia (MDS vs. marrow failure), CP/SOB, possible ACS vs. demand +/- failure, Hx metastatic Br Ca. Patient's progosis: Poor, per Dr. Aletha Halim progress note from 10/28/2017. Goals for treatment: Full Code (for now).  Additional follow-up to be provided: PRN.  Time spent during discussion: > 20 minutes.  Arta Silence, MD

## 2017-10-30 NOTE — Progress Notes (Signed)
PHARMACY NOTE:  ANTIMICROBIAL RENAL DOSAGE ADJUSTMENT  Current antimicrobial regimen includes a mismatch between antimicrobial dosage and estimated renal function.  As per policy approved by the Pharmacy & Therapeutics and Medical Executive Committees, the antimicrobial dosage will be adjusted accordingly.  Current antimicrobial dosage:  Levaquin 500 mg PO q24h  And Fluconazole 200 mg PO q24h  Indication: prophylaxis for neutropenia and esophagitis/thrush  Renal Function:  Estimated Creatinine Clearance: 38.6 mL/min (A) (by C-G formula based on SCr of 1.11 mg/dL (H)).     Antimicrobial dosage has been changed to:    Levaquin 250 mg PO Q24h for Crcl 30-50 ml/min Flucaonzole 100 mg PO q24h for Crcl < 50 ml/min  Additional comments:   Thank you for allowing pharmacy to be a part of this patient's care.  [place], [facility] 10/30/2017 3:04 PM

## 2017-10-30 NOTE — Consult Note (Signed)
Consultation Note Date: 10/30/2017   Patient Name: Shelly Arias  DOB: 04/19/1945  MRN: [id number]  Age / Sex: 72 y.o., female  PCP: [name], MD Referring Physician: Epifanio Lesches, MD  Reason for Consultation: Establishing goals of care  HPI/Patient Profile: 72 y.o. female on 10/29/2017 from home with chest pain. She has a past medical history of metastatic right breast cancer ER/PR positive (s/p surgery and chemoradiation), pancytopenia, MDS with marrow failure, osteoporosis, hypertension, hyperlipidemia, GERD, and COPD. On arrival patient reported developing sudden  Onset of midchest and mid back pain after dinner with heartburn. Patient endorsed palpitation, nausea, and diaphoresis. Pain worsens with cough and deep inspiration. During her ED course BUN 27. Creatinine 1.11. AST 104. ALT 100. Troponin 0.77 which later increased to 0.73, 3.26, 5.48. WBC 1.4. Platelets 34. BNP 2675.0, CT of angio showed no PE, moderate right and small to moderate left pleural effusion, pericardial effusion, recurrent partial collapse of right middle lobe with near complete occlusion at the origin of the right middle lobe bronchus. Since admission patient has been seen by Cardiology and is not a candidate for catheterization/PCI. Recommendations to gently diurese. Medical management with po medications. She has also been seen by Oncology who will continue to closely follow and advise for further transfusion needs. Palliative Medicine team consulted for goals of care discussion.   Clinical Assessment and Goals of Care: I have reviewed medical records including lab results, imaging, Epic notes, and MAR, received report from the bedside RN, and assessed the patient. I then met at the bedside with patient and her husband and son to discuss diagnosis prognosis, GOC, EOL wishes, disposition and options. Patient is A&O x3.  Denies pain at this time. Some shortness of breath. She is able to engage in goals of care discussion with her family and I. Dr. Rogue Bussing also at bedside involved in discussion.   I introduced Palliative Medicine as specialized medical care for people living with serious illness. It focuses on providing relief from the symptoms and stress of a serious illness. The goal is to improve quality of life for both the patient and the family.  We discussed a brief life review of the patient. Shelly Arias reports she has been married to her husband for 47 years and they have a 2 children (son and daughter) with 2 wonder grandchildren. She enjoys spending time with her family and volunteering. She volunteers at [facility] and played a huge role in [place] [facility] support program. She is a woman of strong Christian faith.   As far as functional and nutritional status she reports her health has been a challenge over the past year after being diagnosed with MDS and with a history of metastatic breast cancer. Both she and her family reported noticing a significant decline in her health over the past 6 months and more of a rapid decline in the past 2-3 months. She has little to no appetite, some weight loss, increase in fatigue, requiring frequent platelet and blood  transfusions, requiring assistance at time with ADLs.   We discussed her current illness and what it means in the larger context of her on-going co-morbidities.  Natural disease trajectory and expectations at EOL were discussed. Patient and family tearful during discussion. Patient verbalizes that she knows there are no interventions that can be done. She has been seen by multiple organizations in regards to her cancer/MDS. She reports she knows that her time is near and feels that further treatment and recommendations are not going to help her at this point. She states "I have lived a great life and met some awesome people and  caregivers along my journey and if this is the end, I am ready and blessed." Family does remain somewhat hopeful for more time to spend and that she will recover some from recent events.   I attempted to elicit values and goals of care important to the patient and family.   The difference between aggressive medical intervention and comfort care was considered in light of the patient's goals of care. At this time patient and family would like to take it one day at a time with watchful waiting. She would like to continue to receive blood and/or platelet transfusions as needed. She knows there are no surgical interventions but would like to continue to treat the treatable. They again remain hopeful for the best but also preparing for the worst.   Advanced directives, concepts specific to code status, artifical feeding and hydration, and rehospitalization were considered and discussed. Patient verbalized knowing that suffering from a cardiac arrest or respiratory arrest and having to undergo life-sustaining measures would only decrease or limit the quality of life that she has left. She is tearful and states to her family "I cant' put you all through that!" Family is tearful however very supportive of patient's decision. She has asked to be a DNR/DNI. She is aware that the RN will place a purple bracelet on her to identify her wishes to the medical team.   At this time family would like to continue to treat the treatable and evaluate daily. She is not prepared to accept hospice at this time as she hopeful to continue with blood transfusion until deemed inappropriate. Family would like time to watchfully wait and make a decision on Palliative versus Hospice needs at discharge. Support given.   Questions and concerns were addressed. The family was encouraged to call with questions or concerns.  PMT will continue to support holistically.  Primary Decision Maker: Patient is capable of making medical decisions.  She is A&O x3. If she becomes unable to make decisions her husband Shelly Arias is her designated Media planner with children support.   SUMMARY OF RECOMMENDATIONS    DNR/DNI-as requested by patient  Continue to treat the treatable without escalation of care. Patient and family would like to take it one day at a time and evaluate for signs of improvement or stability. Patient and family aware that there are limited options, however will continue with available medical interventions.   Not prepared for hospice. Will continue to evaluate daily and receive updates and recommendations from Dr. Rogue Bussing and Cardiology. Patient and family hopeful to continue with blood/platelet transfusion support.   Palliative Medicine team will continue to support patient, family, and medical team during hospitalization.   Code Status/Advance Care Planning:  DNR/DNI-as requested by patient and family    Symptom Management:   Xanax PRN for anxiety  Megace BID for appetite stimulant   Morphine PRN for  pain   Palliative Prophylaxis:   Aspiration, Bowel Regimen, Frequent Pain Assessment and Oral Care  Additional Recommendations (Limitations, Scope, Preferences):  Full Scope Treatment-continue to treat the treatable without aggressive measures.   Psycho-social/Spiritual:   Desire for further Chaplaincy support:NO   Prognosis:   < 6 months-in the setting of N-STEMI not a surgical candidate, MDS with bone marrow failure, metastatic breast cancer, decreased mobility, poor po intake, protein calorie malnutrition, anxiety, COPD, hypertension, and collapsed lung.   Discharge Planning: To Be Determined      Primary Diagnoses: Present on Admission: . Chest pain   I have reviewed the medical record, interviewed the patient and family, and examined the patient. The following aspects are pertinent.  Past Medical History:  Diagnosis Date  . Blood dyscrasia   . Breast cancer ([place])    right,  lumpectomy, radiation, chemo  . Breast cancer ([place])    Right, 2007  . Breast cancer ([place]) 06/20/2016   INVASIVE DUCTAL CARCINOMA.  . Collapsed lung 02/14/2017   RIGHT  . Complication of anesthesia    PT STATED AFTER BIL HIP SURGERIES SHE STOPPED BREATHING THE NIGHT AFTER SURGERY  . COPD (chronic obstructive pulmonary disease) ([place])   . GERD (gastroesophageal reflux disease)   . Headache   . History of chemotherapy   . History of methicillin resistant staphylococcus aureus (MRSA) 2011  . History of radiation therapy   . Hyperlipidemia   . Hypertension   . Liver cancer ([place]) 05/2016  . Lung cancer ([place]) 05/2016  . Osteoarthritis    right hip  . Osteoporosis   . Squamous cell carcinoma    leg, Followed by Dr. Nicole Kindred   Social History   Socioeconomic History  . Marital status: Married    Spouse name: Not on file  . Number of children: Not on file  . Years of education: Not on file  . Highest education level: Not on file  Occupational History  . Not on file  Social Needs  . Financial resource strain: Not hard at all  . Food insecurity:    Worry: Never true    Inability: Never true  . Transportation needs:    Medical: No    Non-medical: No  Tobacco Use  . Smoking status: Current Some Day Smoker    Packs/day: 0.25    Years: 30.00    Pack years: 7.50    Types: Cigarettes  . Smokeless tobacco: Never Used  Substance and Sexual Activity  . Alcohol use: Not Currently    Alcohol/week: 0.0 standard drinks    Comment: socially  . Drug use: No  . Sexual activity: Not Currently  Lifestyle  . Physical activity:    Days per week: 4 days    Minutes per session: 30 min  . Stress: Not at all  Relationships  . Social connections:    Talks on phone: Three times a week    Gets together: Twice a week    Attends religious service: More than 4 times per year    Active member of club or organization: No    Attends meetings of clubs or organizations: Never    Relationship  status: Married  Other Topics Concern  . Not on file  Social History Narrative  . Not on file   Family History  Problem Relation Age of Onset  . Heart disease Father 32  . Heart disease Mother   . Hyperlipidemia Mother   . Heart disease Brother   . Diabetes Brother   .  Pancreatic cancer Brother   . Diabetes Maternal Grandmother   . Breast cancer Cousin   . Kidney disease Brother    Scheduled Meds: . acyclovir  400 mg Oral BID  . feeding supplement (ENSURE ENLIVE)  237 mL Oral TID WC & HS  . [START ON 10/31/2017] fluconazole  100 mg Oral Daily  . furosemide  20 mg Intravenous Q12H  . letrozole  2.5 mg Oral Daily  . [START ON 10/31/2017] levofloxacin  250 mg Oral Daily  . megestrol  400 mg Oral BID  . metoprolol succinate  25 mg Oral Daily  . nicotine  21 mg Transdermal Daily  . pantoprazole  40 mg Oral BID  . pravastatin  40 mg Oral Daily  . sucralfate  1 g Oral TID WC & HS   Continuous Infusions: PRN Meds:.acetaminophen, ALPRAZolam, alum & mag hydroxide-simeth, bisacodyl, famotidine, guaiFENesin, ipratropium-albuterol, morphine injection, nitroGLYCERIN, ondansetron (ZOFRAN) IV, promethazine, senna-docusate Medications Prior to Admission:  Prior to Admission medications   Medication Sig Start Date End Date Taking? Authorizing Provider  acyclovir (ZOVIRAX) 400 MG tablet TAKE 1 TABLET BY MOUTH TWICE A DAY 10/14/17  Yes [name], MD  ALPRAZolam Duanne Moron) 0.5 MG tablet Take 1 tablet (0.5 mg total) by mouth every 8 (eight) hours as needed for anxiety. 07/30/17  Yes [name], MD  cyanocobalamin (,VITAMIN B-12,) 1000 MCG/ML injection Inject 1,000 mcg into the muscle every 30 (thirty) days.   Yes [provider]  fexofenadine (ALLEGRA) 180 MG tablet Take 180 mg by mouth every morning.    Yes [provider]  fluconazole (DIFLUCAN) 200 MG tablet Take 1 tablet (200 mg total) by mouth daily. 10/25/17  Yes [name], MD    Glucosamine-Chondroitin (COSAMIN DS) 500-400 MG CAPS Take 1 tablet by mouth every morning.    Yes [provider]  letrozole (FEMARA) 2.5 MG tablet TAKE 1 TABLET (2.5 MG TOTAL) BY MOUTH DAILY. ONCE A DAY. 09/18/17  Yes [name], MD  levofloxacin (LEVAQUIN) 500 MG tablet Take 500 mg by mouth daily.   Yes [provider]  metoprolol succinate (TOPROL-XL) 25 MG 24 hr tablet Take 1 tablet (25 mg total) by mouth daily. 05/02/17  Yes [name], MD  pantoprazole (PROTONIX) 40 MG tablet Take 1 tablet (40 mg total) by mouth 2 (two) times daily. 10/23/17  Yes [name], MD  Probiotic Product (PROBIOTIC & ACIDOPHILUS EX ST PO) Take 1 tablet by mouth daily.    Yes [provider]  ranitidine (ZANTAC) 75 MG tablet Take 75 mg by mouth daily as needed for heartburn.    Yes [provider]  Vitamin D, Ergocalciferol, (DRISDOL) 50000 units CAPS capsule TAKE 1 CAPSULE (50,000 UNITS TOTAL) BY MOUTH EVERY SATURDAY. IN THE MORNING. 08/12/17  Yes [name], MD  megestrol (MEGACE ES) 625 MG/5ML suspension Take 5 mLs (625 mg total) by mouth 2 (two) times daily. 10/28/17   [name], MD  nicotine (NICODERM CQ - DOSED IN MG/24 HOURS) 21 mg/24hr patch Place 1 patch (21 mg total) onto the skin daily. Patient not taking: Reported on 10/28/2017 10/19/17   Demetrios Loll, MD  pravastatin (PRAVACHOL) 40 MG tablet Take 1 tablet (40 mg total) by mouth daily. Patient not taking: Reported on 10/28/2017 05/02/17   [name], MD  prochlorperazine (COMPAZINE) 10 MG tablet Take 1 tablet (10 mg total) by mouth every 6 (six) hours as needed for nausea or vomiting. Patient not taking:  Reported on 10/28/2017 07/15/17   Verlon Au, NP  sucralfate (CARAFATE) 1 g tablet Take 1 tablet (1 g total) by mouth 4 (four) times daily -  with meals and at bedtime. Patient not taking: Reported on 10/28/2017 10/28/17   Verlon Au, NP   Allergies  Allergen  Reactions  . Codeine Anaphylaxis  . Fish-Derived Products Anaphylaxis  . Morphine Sulfate Other (See Comments)    REACTION: Hypotension  . Adhesive [Tape]     PAPER TAPE OK TO USE  . Oxycodone     Nausea and vomiting   Review of Systems  Constitutional: Positive for activity change, appetite change and fatigue.  Respiratory: Positive for shortness of breath.   Cardiovascular: Positive for chest pain and leg swelling.  Neurological: Positive for weakness.  All other systems reviewed and are negative.   Physical Exam  Constitutional: She is oriented to person, place, and time. She is cooperative. She appears ill.  Thin and frail in appearance   Cardiovascular: Normal heart sounds. An irregular rhythm present. Exam reveals decreased pulses.  Chest pain at times, bilateral lower extremity edema    Pulmonary/Chest: She has decreased breath sounds.  Shortness of breath, 2L/[place]   Abdominal: Soft. Normal appearance and bowel sounds are normal.  Musculoskeletal:  Generalized weakness  Neurological: She is alert and oriented to person, place, and time.  Skin: Skin is warm and intact.  Psychiatric: She has a normal mood and affect. Her speech is normal and behavior is normal. Judgment and thought content normal. Cognition and memory are normal.  Tearful   Nursing note and vitals reviewed.   Vital Signs: BP 106/61 (BP Location: Left Arm)   Pulse (!) 110   Temp (!) 97.5 F (36.4 C) (Oral)   Resp 18   Ht 5' 1"  (1.549 m)   Wt 58.1 kg   SpO2 99%   BMI 24.19 kg/m  Pain Scale: 0-10   Pain Score: 0-No pain   SpO2: SpO2: 99 % O2 Device:SpO2: 99 % O2 Flow Rate: .O2 Flow Rate (L/min): 1 L/min  IO: Intake/output summary:   Intake/Output Summary (Last 24 hours) at 10/30/2017 1707 Last data filed at 10/30/2017 1643 Gross per 24 hour  Intake -  Output 250 ml  Net -250 ml    LBM: Last BM Date: 10/29/17 Baseline Weight: Weight: 58.1 kg Most recent weight: Weight: 58.1 kg       Palliative Assessment/Data: PPS 40 % with decreased appetite   Time In: 1500 Time Out: 1630 Time Total: 90 min.   Greater than 50%  of this time was spent counseling and coordinating care related to the above assessment and plan.  Signed by:  Alda Lea, NP-BC Palliative Medicine Team  Phone: [phone] Fax: [phone] Pager: [phone] Amion: Bjorn Pippin    Please contact Palliative Medicine Team phone at [phone] for questions and concerns.  For individual provider: See Shea Evans

## 2017-10-30 NOTE — Progress Notes (Signed)
Admitted this morning for substernal chest pain, non-ST elevation MI, elevated troponins up to 5.  Patient BNP 2675.  Labs, previous history reviewed.  No other patient from previous hospitalization. 1.  Acute non-ST elevation MI, not a candidate for any aspirin or heparin, aggressive intervention because of underlying MDS, severe recurrent anemia, thrombocytopenia.  Patient is on low-dose beta-blockers.  Continue statins.  Continue  nitro for chest pain.  Spoke with Dr. Rogue Bussing from oncology and did not recommend even aspirin.  So I discontinued the aspirin.  Continue Ranexa. 2.  Acute on chronic systolic heart failure, continue IV Lasix..  EF 25 to 30%.,  Continue adding ACE inhibitors/ARB's once BP stable on IV Lasix.  Recommend palliative care consult, hospice evaluation. 3.  MDS, recurrent blood transfusions, platelet transfusion patient usually gets irradiated platelets.  Oncology consult placed.  Patient has history of recurrent breast cancer, MDS and overall prognosis really poo timeSpent on this patient, calling consult on discussing the case with Dr. END,is about 20 minutes today.

## 2017-10-30 NOTE — H&P (Addendum)
[facility] [place] NAME: Shelly Arias    MR#:  [id number]  DATE OF BIRTH:  [birth year]  DATE OF ADMISSION:  10/29/2017  PRIMARY CARE PHYSICIAN: Shelly Haven, MD   REQUESTING/REFERRING PHYSICIAN: Nance Pear, MD  CHIEF COMPLAINT:   Chief Complaint  Patient presents with  . Chest Pain    HISTORY OF PRESENT ILLNESS:  Shelly Arias  is a 72 y.o. female with a known history of metastatic Br Ca (surgery + chemoradiation; Dr. 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 Pt's husband, son and daughter are at bedside. Pt had a platelet transfusion on 10/29/2017. @~1800PM, pt was eating dinner, and developed sudden onset pain in the midchest and mid back. She cannot tell me if it was sharp/dull, but says it was a pressure, "Similar to a toothache," w/ a component of heartburn/indigestion. She states she laid down, but the pain got worse, and she became short of breath. She endorses palpitations, diaphoresis and nausea (though she has chronic nausea). (-) AP, vomiting, diarrhea. She states the pain was worse w/ cough and deep inspiration, but was non-positional. She called out to her son. He checked her VS, HR 150, BP 140/105. EMS called. Pt's son states pt felt better after receiving breathing treatment.  Pt states having Hx of chest radiation. She had an Echo on 08/09 (while recently hospitalized for cellulitis, 08/08-08/10), result report unavailable/not accessible for review. ROS (+) decreased appetite, poor PO intake, dry mouth, fatigue/malaise, generalized weakness, leg edema, cough. Pt has R chest port.  Pt and family are realistic and receptive to information. At the end of our encounter, I strongly believe they have a good grasp of the situation and prognosis.  PAST MEDICAL HISTORY:   Past Medical History:  Diagnosis Date  . Blood dyscrasia   . Breast cancer ([place])    right, lumpectomy, radiation, chemo  .  Breast cancer ([place])    Right, 2007  . Breast cancer ([place]) 06/20/2016   INVASIVE DUCTAL CARCINOMA.  . Collapsed lung 02/14/2017   RIGHT  . Complication of anesthesia    PT STATED AFTER BIL HIP SURGERIES SHE STOPPED BREATHING THE NIGHT AFTER SURGERY  . COPD (chronic obstructive pulmonary disease) ([place])   . GERD (gastroesophageal reflux disease)   . Headache   . History of chemotherapy   . History of methicillin resistant staphylococcus aureus (MRSA) 2011  . History of radiation therapy   . Hyperlipidemia   . Hypertension   . Liver cancer ([facility]) 05/2016  . Lung cancer ([place]) 05/2016  . Osteoarthritis    right hip  . Osteoporosis   . Squamous cell carcinoma    leg, Followed by Dr. Nicole Kindred    PAST SURGICAL HISTORY:   Past Surgical History:  Procedure Laterality Date  . ABDOMINAL HYSTERECTOMY    . BREAST BIOPSY Left 2002   left breast, calcifications  . BREAST BIOPSY Left 06/20/2016   INVASIVE DUCTAL CARCINOMA.  Mar[facility] Kitchen BREAST EXCISIONAL BIOPSY Right 2007   positive  . bunion repair    . ESOPHAGOGASTRODUODENOSCOPY (EGD) WITH PROPOFOL N/A 08/05/2016   Procedure: ESOPHAGOGASTRODUODENOSCOPY (EGD) WITH PROPOFOL;  Surgeon: San Jetty, MD;  Location: [facility] ENDOSCOPY;  Service: General;  Laterality: N/A;  . IR FLUORO GUIDE CV LINE RIGHT  07/25/2017  . JOINT REPLACEMENT    . left breast biopsy    . NASAL SINUS SURGERY    . PORTACATH PLACEMENT Right 04/13/2017   Procedure: INSERTION PORT-A-CATH- RIGHT  INTERNAL JUGULAR;  Surgeon: Robert Bellow, MD;  Location: [facility] ORS;  Service: General;  Laterality: Right;  . right hip replacement    . SHOULDER SURGERY    . SQUAMOUS CELL CARCINOMA EXCISION     right leg, Dr. Nicole Kindred  . TOTAL HIP ARTHROPLASTY     right    SOCIAL HISTORY:   Social History   Tobacco Use  . Smoking status: Current Some Day Smoker    Packs/day: 0.25    Years: 30.00    Pack years: 7.50    Types: Cigarettes  . Smokeless tobacco: Never Used  Substance Use Topics    . Alcohol use: Not Currently    Alcohol/week: 0.0 standard drinks    Comment: socially    FAMILY HISTORY:   Family History  Problem Relation Age of Onset  . Heart disease Father 59  . Heart disease Mother   . Hyperlipidemia Mother   . Heart disease Brother   . Diabetes Brother   . Pancreatic cancer Brother   . Diabetes Maternal Grandmother   . Breast cancer Cousin   . Kidney disease Brother     DRUG ALLERGIES:   Allergies  Allergen Reactions  . Codeine Anaphylaxis  . Fish-Derived Products Anaphylaxis  . Morphine Sulfate Other (See Comments)    REACTION: Hypotension  . Adhesive [Tape]     PAPER TAPE OK TO USE  . Oxycodone     Nausea and vomiting    REVIEW OF SYSTEMS:   Review of Systems  Constitutional: Positive for diaphoresis and malaise/fatigue. Negative for chills, fever and weight loss.  HENT: Negative for congestion, ear pain, hearing loss, nosebleeds, sinus pain, sore throat and tinnitus.   Eyes: Negative for blurred vision, double vision and photophobia.  Respiratory: Positive for cough and shortness of breath. Negative for hemoptysis, sputum production and wheezing.   Cardiovascular: Positive for chest pain, palpitations and leg swelling. Negative for orthopnea, claudication and PND.  Gastrointestinal: Positive for heartburn and nausea. Negative for abdominal pain, blood in stool, constipation, diarrhea, melena and vomiting.  Genitourinary: Negative for dysuria, frequency, hematuria and urgency.  Musculoskeletal: Positive for back pain. Negative for joint pain, myalgias and neck pain.  Skin: Negative for itching and rash.  Neurological: Positive for weakness. Negative for dizziness, tingling, tremors, sensory change, speech change, focal weakness, seizures, loss of consciousness and headaches.  Psychiatric/Behavioral: Negative for memory loss. The patient does not have insomnia.    MEDICATIONS AT HOME:   Prior to Admission medications   Medication Sig  Start Date End Date Taking? Authorizing Provider  acyclovir (ZOVIRAX) 400 MG tablet TAKE 1 TABLET BY MOUTH TWICE A DAY 10/14/17  Yes [name], MD  ALPRAZolam Duanne Moron) 0.5 MG tablet Take 1 tablet (0.5 mg total) by mouth every 8 (eight) hours as needed for anxiety. 07/30/17  Yes [name], MD  cyanocobalamin (,VITAMIN B-12,) 1000 MCG/ML injection Inject 1,000 mcg into the muscle every 30 (thirty) days.   Yes [provider]  fexofenadine (ALLEGRA) 180 MG tablet Take 180 mg by mouth every morning.    Yes [provider]  fluconazole (DIFLUCAN) 200 MG tablet Take 1 tablet (200 mg total) by mouth daily. 10/25/17  Yes [name], MD  Glucosamine-Chondroitin (COSAMIN DS) 500-400 MG CAPS Take 1 tablet by mouth every morning.    Yes [provider]  letrozole (FEMARA) 2.5 MG tablet TAKE 1 TABLET (2.5 MG TOTAL) BY MOUTH DAILY. ONCE A DAY. 09/18/17  Yes Charlaine Dalton  R, MD  levofloxacin (LEVAQUIN) 500 MG tablet Take 500 mg by mouth daily.   Yes [provider]  metoprolol succinate (TOPROL-XL) 25 MG 24 hr tablet Take 1 tablet (25 mg total) by mouth daily. 05/02/17  Yes Shelly Haven, MD  pantoprazole (PROTONIX) 40 MG tablet Take 1 tablet (40 mg total) by mouth 2 (two) times daily. 10/23/17  Yes [name], MD  Probiotic Product (PROBIOTIC & ACIDOPHILUS EX ST PO) Take 1 tablet by mouth daily.    Yes [provider]  ranitidine (ZANTAC) 75 MG tablet Take 75 mg by mouth daily as needed for heartburn.    Yes [provider]  Vitamin D, Ergocalciferol, (DRISDOL) 50000 units CAPS capsule TAKE 1 CAPSULE (50,000 UNITS TOTAL) BY MOUTH EVERY SATURDAY. IN THE MORNING. 08/12/17  Yes [name], MD  megestrol (MEGACE ES) 625 MG/5ML suspension Take 5 mLs (625 mg total) by mouth 2 (two) times daily. 10/28/17   [name], MD  nicotine (NICODERM CQ - DOSED IN MG/24 HOURS) 21 mg/24hr patch Place 1 patch (21  mg total) onto the skin daily. Patient not taking: Reported on 10/28/2017 10/19/17   Demetrios Loll, MD  pravastatin (PRAVACHOL) 40 MG tablet Take 1 tablet (40 mg total) by mouth daily. Patient not taking: Reported on 10/28/2017 05/02/17   Shelly Haven, MD  prochlorperazine (COMPAZINE) 10 MG tablet Take 1 tablet (10 mg total) by mouth every 6 (six) hours as needed for nausea or vomiting. Patient not taking: Reported on 10/28/2017 07/15/17   Verlon Au, NP  sucralfate (CARAFATE) 1 g tablet Take 1 tablet (1 g total) by mouth 4 (four) times daily -  with meals and at bedtime. Patient not taking: Reported on 10/28/2017 10/28/17   Verlon Au, NP      VITAL SIGNS:  Blood pressure 118/77, pulse (!) 110, temperature 98.1 F (36.7 C), resp. rate (!) 29, height 5\' 1"  (1.549 m), weight 58.1 kg, SpO2 96 %.  PHYSICAL EXAMINATION:  Physical Exam  Constitutional: She is oriented to person, place, and time. She appears well-developed. She appears cachectic. She is active and cooperative.  Non-toxic appearance. She has a sickly appearance. She appears ill. No distress. She is not intubated.  HENT:  Head: Normocephalic and atraumatic.  Mouth/Throat: Oropharynx is clear and moist. Mucous membranes are not pale, dry and not cyanotic. No oropharyngeal exudate.  Eyes: Conjunctivae, EOM and lids are normal. No scleral icterus.  Neck: Neck supple. No JVD present. No thyromegaly present.  Cardiovascular: S1 normal and S2 normal.  No extrasystoles are present. Tachycardia present. Exam reveals no gallop, no S3, no S4, no distant heart sounds and no friction rub.  No murmur heard. Pulmonary/Chest: Effort normal. No accessory muscle usage or stridor. Tachypnea noted. No apnea and no bradypnea. She is not intubated. No respiratory distress. She has decreased breath sounds in the right upper field, the right middle field, the right lower field, the left upper field, the left middle field and the left lower field. She  has no wheezes. She has no rhonchi. She has rales in the right lower field.  R chest port.  Abdominal: Soft. Bowel sounds are normal. She exhibits no distension. There is no tenderness. There is no rigidity, no rebound and no guarding.  Musculoskeletal: Normal range of motion. She exhibits edema (1-2+ B/L LE edema). She exhibits no tenderness.       Right lower leg: She exhibits edema (1-2+ B/L LE edema). She  exhibits no tenderness.       Left lower leg: She exhibits edema (1-2+ B/L LE edema). She exhibits no tenderness.  Lymphadenopathy:    She has no cervical adenopathy.  Neurological: She is alert and oriented to person, place, and time. She is not disoriented.  Skin: Skin is warm and dry. No rash noted. She is not diaphoretic. No erythema. There is pallor.  Psychiatric: She has a normal mood and affect. Her behavior is normal. Judgment and thought content normal. Her mood appears not anxious. She is not agitated.   Tachycardic, tachypneic. Not in distress. Diffusely diminished breath sounds, bibasilar fine crackles, (-) rhonchi/wheezing. (-) JVD, (+) 1-2+ B/L LE edema. Dry mouth. LABORATORY PANEL:   CBC Recent Labs  Lab 10/29/17 2106  WBC 1.8*  HGB 8.2*  HCT 23.9*  PLT 70*   ------------------------------------------------------------------------------------------------------------------  Chemistries  Recent Labs  Lab 10/29/17 2106  NA 135  K 4.1  CL 100  CO2 26  GLUCOSE 191*  BUN 27*  CREATININE 1.11*  CALCIUM 8.9  MG 1.7  AST 104*  ALT 100*  ALKPHOS 96  BILITOT 0.9   ------------------------------------------------------------------------------------------------------------------  Cardiac Enzymes Recent Labs  Lab 10/29/17 2106  TROPONINI 0.73*  0.77*   ------------------------------------------------------------------------------------------------------------------  RADIOLOGY:  Dg Chest 2 View  Result Date: 10/29/2017 CLINICAL DATA:  Chest pain EXAM:  CHEST - 2 VIEW COMPARISON:  07/21/2017, PET-CT 06/03/2017 FINDINGS: Right-sided central venous port tip over the proximal right atrium. Small bilateral pleural effusions and bibasilar airspace disease. Stable cardiomediastinal silhouette with aortic atherosclerosis. No pneumothorax. IMPRESSION: Small bilateral pleural effusions with bibasilar and right middle lobe airspace disease suspicious for pneumonia. Electronically Signed   By: Donavan Foil M.D.   On: 10/29/2017 21:23   Ct Angio Chest Pe W And/or Wo Contrast  Result Date: 10/29/2017 CLINICAL DATA:  Chest pain.  Malignancy history. EXAM: CT ANGIOGRAPHY CHEST WITH CONTRAST TECHNIQUE: Multidetector CT imaging of the chest was performed using the standard protocol during bolus administration of intravenous contrast. Multiplanar CT image reconstructions and MIPs were obtained to evaluate the vascular anatomy. CONTRAST:  36mL ISOVUE-370 IOPAMIDOL (ISOVUE-370) INJECTION 76% COMPARISON:  PET-CT 06/03/2017, chest CT 02/25/2017. Radiographs earlier this day. FINDINGS: Cardiovascular: There are no filling defects within the pulmonary arteries to suggest pulmonary embolus. Aortic atherosclerosis without aneurysm. No gross dissection allowing for phase of contrast. Mild right heart dilatation with contrast refluxing into the IVC. Small to moderate pericardial effusion measuring up to 14 mm in depth. Accessed right internal jugular port with tip in the distal SVC. Mediastinum/Nodes: Multiple small mediastinal nodes not enlarged by size criteria. Soft tissue density in the right hilum in the region of bronchus intermedius and origin of the right mainstem bronchus. No left hilar adenopathy. No axillary adenopathy. Esophagus is decompressed, partially obscured by adjacent pleural fluid. Right axillary surgical clips, no axillary adenopathy. Lungs/Pleura: Moderate right and small to moderate left pleural effusion. Adjacent compressive atelectasis in both lower lobes.  Patchy ground-glass opacities with mild septal thickening in both lower lobes may represent pulmonary edema. Near complete occlusion of the origin of the right middle lobe bronchus, unclear if this is secondary to bronchial filling or external compression, associated volume loss and patchy ground-glass opacities throughout the right middle lobe. There is improved aeration compared to 02/25/2017 CT but worsened since 06/03/2017 PET. Irregular left apical opacity has a linear morphology on axial imaging but spiculated nodular appearance on coronal reformats and measures approximately 1.5 x 1.8 x 1.3 cm, image 20  series 5. Pleural tail abuts the medial pleural. Small nodule in the anterior right upper lobe measuring approximately 8 x 3 mm is unchanged from prior exams, image 39 series 6. Mild upper lobe predominant emphysema. Upper Abdomen: No acute findings of the visualized upper abdomen. Musculoskeletal: No lytic or blastic osseous lesions. Scoliosis and degenerative change in the spine. Review of the MIP images confirms the above findings. IMPRESSION: 1. No pulmonary embolus. 2. Moderate right and small to moderate left pleural effusion. Patchy opacities with septal thickening in the lower lobes suspicious for pulmonary edema, in conjunction with contrast refluxing into the IVC. Findings suggesting mild congestive heart failure. 3. Small to moderate pericardial effusion. 4. Irregular left apical nodular opacity measuring up to 1.8 cm, new from prior PET. Given history of malignancy, this is nonspecific for infectious/inflammatory process versus metastasis. Consider repeat PET characterization versus follow-up CT in 3 months. 5. Recurrent partial collapse of the right middle lobe with near complete occlusion at the origin of the right middle lobe bronchus, unclear if this is due to intraluminal filling or external compression. Collapse was not present on most recent PET imaging but was seen on chest CT 8 months  prior, suggesting this is intermittent. Aortic Atherosclerosis (ICD10-I70.0) and Emphysema (ICD10-J43.9). Electronically Signed   By: Jeb Levering M.D.   On: 10/29/2017 23:22   IMPRESSION AND PLAN:   A/P: 58F w/ metastatic Br Ca p/w 1d Hx CP/SOB, Trop-I 0.77. Presentation concerning for ACS vs. demand ischemia +/- failure. Heartburn/indigestion, hyperglycemia, Cr elevation/CKD III, hypoalbuminemia, transaminasemia, pancytopenia. -Dr. Aletha Halim progress note from 10/28/2017 notes presence of MDS vs. marrow failure, states, "Unfortunately overall prognosis is poor." I have had an extremely long conversation with the pt and family. Goals of care have been clearly stated. The main priority for the pt and family will be control of symptoms and making sure the pt feels as well as she possibly can. I discussed involving Palliative Care for management of symptoms and optimizing pt's comfort. They agree. They are aware this is not the same as hospice. However, they also know that prognosis is poor. They state she has deteriorated significantly in the past two weeks. At the end of our conversation, the pt herself is realistic with regards to her condition and prognosis. -CP/SOB, Trop-I elevation, ACS vs. demand +/- failure: Pt presents w/ c/o CP/SOB w/ diaphoresis, nausea. CTA chest (-) PE/aneurysm/dissection. Mixed typical + atypical CP characteristics. Trop-I 0.77. Discussed possibility of ACS vs. demand w/ family. Given risks and comorbidities, no ASA and no systemic AC (both the ED physician and I were reluctant to start either of these interventions given txfn-dependent thrombocytopenia; pt and family have also refused these interventions). Trend Trop-I. Cardiac monitoring. Low-dose Morphine (hypotension, 1-2mg ), NTG, O2. Will c/w Statin (recently stopped), beta blocker. I do not believe there is a need for Cardiology consultation, as I expect recommendations will be symptomatic and conservative medical  mgmt. Unclear if pt is in failure, mixed picture. (+) bibasilar crackles, 1-2+ B/L LE edema, imaging (+) edema + effusions; however, (-) JVD, pt endorses thirst, BUN/Cr ratio 27/1.11 = 24. BNP pending. Echo performed 08/09, result report unavailable/not accessible for review. Clinical state may be result of diastolic failure + hypoalbuminemia, though unclear. Reluctant to diurese. Reluctant to administer IV fluids. Encourage oral fluid intake. (-) wheezing. DuoNebs PRN (I informed the family that Albuterol may potentiate tachycardia, which may in turn worsen demand; they acknowledge this possibility exists, but nontheless wish to prioritize symptomatic mgmt of  dyspnea; pt felt SOB improved w/ previously-administered neb), incentive spirometry, pulmonary toileting. -Heartburn/indigestion: Protonix. Family has not picked up Carafate at pharmacy yet, will start while inpt. Pepcid PRN. -Cr elevation/CKD III: Cr 1.11 on present admission, baseline 0.84-0.99 based on review of prior labwork. Mild elevation, suspect intravascular volume depletion, (-) AKI. 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 Monitor BMP, avoid nephrotoxins. Pt stopped taking Lasix as of Sunday (08/18) per outpt provider instructions. Has received IVF on Monday 08/19, and platelet txfn on Tuesday 08/20. -Hypoalbuminemia: Suspect malnourishment. Prealbumin. Nutrition consult. Family has not picked up Megace at pharmacy yet, will start while inpt. -Transaminasemia: AST 104, ALT 100. Values > 2x ULN, but < 3x ULN. Etiology unclear. (-) AP. -Pancytopenia: MDS vs. marrow failure. Received platelet txfn on 08/20. WBC 1.6, Neut 0.8 (leukopenia + neutropenia); Hgb 8.2, MCV 86.1 (normocytic anemia); Plt 70. I expect it is a good idea to get Dr. Rogue Bussing involved. -Nicotine patch at pt request. -c/w other home meds/formulary subs. -FEN/GI: Regular diet as tolerated. -DVT PPx: SCDs, no pharmacological PPx (thrombocytopenia, discussed above). -Code  status/ACP: Had a comprehensive discussion (> 64min) regarding resuscitation status. Up until now, pt has been full code. However, the pt and family also acknowledge that she has deteriorated significantly within the past 2wks. She states that she knows there is no cure, her prognosis is poor, her quality of life has not been particularly good (given recent hospitalizations and decreasing functional state). She tells me that she believes she is now starting to reach the end of what is possible through contemporary medicine. That said, she and her family have not had a candid discussion about code status. After I provided them with information regarding the low likelihood of deriving any benefit from CPR/intubation in the setting of pt's significant medical comorbidity, advanced age and physical deterioration (i.e. suspected malnourishment, discussed above), the family are leaning towards DNR/DNI designation. I am making the pt full code for now, but the pt and family will continue to discuss this, and will follow up with the day provider. I did not broach the subject of hospice, though I suspect pt has a < 44mo projected survival, and is as such likely hospice-appropriate. -Disposition: Admission, > 2 midnights.  Addendum: On repeat, Troponin-I increased from 0.77 to 3.26. NSTEMI. I am hard pressed to establish a plan of management that will improve this pt's morbidity/mortality in the acute setting, as due to the aforementioned significant/severe comorbid medical conditions. As above.   All the records are reviewed and case discussed with ED provider. Management plans discussed with the patient, family and they are in agreement.  CODE STATUS: Full code.  TOTAL TIME TAKING CARE OF THIS PATIENT: 100 minutes.    Arta Silence M.D on 10/30/2017 at 1:03 AM  Between 7am to 6pm - Pager - [phone]  After 6pm go to [email or web address] - Technical brewer [facility]  Office   [phone]  CC: Primary care physician; Shelly Haven, MD   Note: This dictation was prepared with Dragon dictation along with smaller phrase technology. Any transcriptional errors that result from this process are unintentional.

## 2017-10-30 NOTE — ED Notes (Signed)
Called house supervisor for hospital bed.

## 2017-10-30 NOTE — ED Notes (Signed)
Pt sitting up in bed with family at bedside, pt eating breakfast at this time. Will continue to monitor for further patient needs.

## 2017-10-30 NOTE — ED Notes (Signed)
Pt placed on 2L for sleep.

## 2017-10-30 NOTE — ED Notes (Signed)
Pt seen by Dr. Saunders Revel, and PA. Per MD and PA pt is okay to eat at this time.

## 2017-10-30 NOTE — ED Notes (Signed)
Pt family came out c/o pulse ox not working. Went in the rm and placed a new pulse ox sensor on pt finger. Pt O2 100%.

## 2017-10-30 NOTE — ED Notes (Signed)
Attempted to call report to floor. Informed by secretary that nurse was at lunch and would call me back in ten minutes. Secretary made aware that bedside report would be given in 15 minutes if needed.

## 2017-10-30 NOTE — Consult Note (Signed)
[place] [facility] CONSULT NOTE  Patient Care Team: [name], MD as PCP - General (Family Medicine) [name], DPM as Consulting Physician (Podiatry) [name], MD as Medical Oncologist (Hematology and Oncology) Bary Castilla, Forest Gleason, MD (General Surgery)  CHIEF COMPLAINTS/PURPOSE OF CONSULTATION:  Pancytopenia/breast cancer  HISTORY OF PRESENTING ILLNESS:  Shelly Arias 72 y.o.  female with a history of bone marrow failure syndrome-undiagnosed; and also history of metastatic breast cancer on letrozole-is currently admitted the hospital for chest pain worsening shortness of breath.  CTA shows-pulmonary edema no PE; troponins elevated 5 BNP 2000+.  Recent 2D echo showed ejection fraction of 20 to 25%.  Cardiology has evaluated the patient-poor candidate for any cardiac catheter antiplatelet therapy.  With regards to bone marrow failure-patient has had up to 4 bone marrow biopsies without a clear diagnosis.  Patient has been evaluated at multiple medical centers including [facility]; [facility]; [place]-without any clear diagnosis.  Patient had one cycle of Vidaza on April 1.  He will further because of worsening pancytopenia.  Patient is currently getting weekly blood; platelets [crossmatched]; Granix.  Poor appetite.  Weight loss.  Positive for nausea reflux-like symptoms.  ROS: A complete 10 point review of system is done which is negative except mentioned above in history of present illness  MEDICAL HISTORY:  Past Medical History:  Diagnosis Date  . Blood dyscrasia   . Breast cancer ([place])    right, lumpectomy, radiation, chemo  . Breast cancer ([place])    Right, 2007  . Breast cancer ([place]) 06/20/2016   INVASIVE DUCTAL CARCINOMA.  . Collapsed lung 02/14/2017   RIGHT  . Complication of anesthesia    PT STATED AFTER BIL HIP SURGERIES SHE STOPPED BREATHING THE NIGHT AFTER SURGERY  . COPD (chronic obstructive pulmonary disease) ([place])   .  GERD (gastroesophageal reflux disease)   . Headache   . History of chemotherapy   . History of methicillin resistant staphylococcus aureus (MRSA) 2011  . History of radiation therapy   . Hyperlipidemia   . Hypertension   . Liver cancer ([place]) 05/2016  . Lung cancer ([place]) 05/2016  . Osteoarthritis    right hip  . Osteoporosis   . Squamous cell carcinoma    leg, Followed by Dr. Nicole Kindred    SURGICAL HISTORY: Past Surgical History:  Procedure Laterality Date  . ABDOMINAL HYSTERECTOMY    . BREAST BIOPSY Left 2002   left breast, calcifications  . BREAST BIOPSY Left 06/20/2016   INVASIVE DUCTAL CARCINOMA.  Marland Kitchen BREAST EXCISIONAL BIOPSY Right 2007   positive  . bunion repair    . ESOPHAGOGASTRODUODENOSCOPY (EGD) WITH PROPOFOL N/A 08/05/2016   Procedure: ESOPHAGOGASTRODUODENOSCOPY (EGD) WITH PROPOFOL;  Surgeon: San Jetty, MD;  Location: [facility] ENDOSCOPY;  Service: General;  Laterality: N/A;  . IR FLUORO GUIDE CV LINE RIGHT  07/25/2017  . JOINT REPLACEMENT    . left breast biopsy    . NASAL SINUS SURGERY    . PORTACATH PLACEMENT Right 04/13/2017   Procedure: INSERTION PORT-A-CATH- RIGHT INTERNAL JUGULAR;  Surgeon: Robert Bellow, MD;  Location: [facility] ORS;  Service: General;  Laterality: Right;  . right hip replacement    . SHOULDER SURGERY    . SQUAMOUS CELL CARCINOMA EXCISION     right leg, Dr. Nicole Kindred  . TOTAL HIP ARTHROPLASTY     right    SOCIAL HISTORY: Social History   Socioeconomic History  . Marital status: Married    Spouse name: Not on  file  . Number of children: Not on file  . Years of education: Not on file  . Highest education level: Not on file  Occupational History  . Not on file  Social Needs  . Financial resource strain: Not hard at all  . Food insecurity:    Worry: Never true    Inability: Never true  . Transportation needs:    Medical: No    Non-medical: No  Tobacco Use  . Smoking status: Current Some Day Smoker    Packs/day: 0.25    Years: 30.00     Pack years: 7.50    Types: Cigarettes  . Smokeless tobacco: Never Used  Substance and Sexual Activity  . Alcohol use: Not Currently    Alcohol/week: 0.0 standard drinks    Comment: socially  . Drug use: No  . Sexual activity: Not Currently  Lifestyle  . Physical activity:    Days per week: 4 days    Minutes per session: 30 min  . Stress: Not at all  Relationships  . Social connections:    Talks on phone: Three times a week    Gets together: Twice a week    Attends religious service: More than 4 times per year    Active member of club or organization: No    Attends meetings of clubs or organizations: Never    Relationship status: Married  . Intimate partner violence:    Fear of current or ex partner: No    Emotionally abused: No    Physically abused: No    Forced sexual activity: No  Other Topics Concern  . Not on file  Social History Narrative  . Not on file    FAMILY HISTORY: Family History  Problem Relation Age of Onset  . Heart disease Father 42  . Heart disease Mother   . Hyperlipidemia Mother   . Heart disease Brother   . Diabetes Brother   . Pancreatic cancer Brother   . Diabetes Maternal Grandmother   . Breast cancer Cousin   . Kidney disease Brother     ALLERGIES:  is allergic to codeine; fish-derived products; morphine sulfate; adhesive [tape]; and oxycodone.  MEDICATIONS:  Current Facility-Administered Medications  Medication Dose Route Frequency Provider Last Rate Last Dose  . acetaminophen (TYLENOL) tablet 650 mg  650 mg Oral Q4H PRN Arta Silence, MD      . acyclovir (ZOVIRAX) 200 MG capsule 400 mg  400 mg Oral BID Arta Silence, MD   400 mg at 10/30/17 1156  . ALPRAZolam Duanne Moron) tablet 0.5 mg  0.5 mg Oral Q8H PRN Arta Silence, MD   0.5 mg at 10/30/17 1439  . alum & mag hydroxide-simeth (MAALOX/MYLANTA) 200-200-20 MG/5ML suspension 15 mL  15 mL Oral Q4H PRN Arta Silence, MD      . bisacodyl (DULCOLAX) EC tablet 5 mg  5  mg Oral Daily PRN Arta Silence, MD      . famotidine (PEPCID) tablet 20 mg  20 mg Oral BID PRN Arta Silence, MD      . feeding supplement (ENSURE ENLIVE) (ENSURE ENLIVE) liquid 237 mL  237 mL Oral TID WC & HS Epifanio Lesches, MD   237 mL at 10/30/17 1254  . [START ON 10/31/2017] fluconazole (DIFLUCAN) tablet 100 mg  100 mg Oral Daily Epifanio Lesches, MD      . furosemide (LASIX) injection 20 mg  20 mg Intravenous Q12H Christell Faith M, PA-C   20 mg at 10/30/17 1050  . guaiFENesin ([place])  12 hr tablet 1,200 mg  1,200 mg Oral BID PRN Arta Silence, MD      . ipratropium-albuterol (DUONEB) 0.5-2.5 (3) MG/3ML nebulizer solution 3 mL  3 mL Nebulization Q4H PRN Arta Silence, MD   3 mL at 10/30/17 1050  . letrozole [facility]) tablet 2.5 mg  2.5 mg Oral Daily Arta Silence, MD   2.5 mg at 10/30/17 1157  . [START ON 10/31/2017] levofloxacin (LEVAQUIN) tablet 250 mg  250 mg Oral Daily Epifanio Lesches, MD      . megestrol (MEGACE) 400 MG/10ML suspension 400 mg  400 mg Oral BID Arta Silence, MD   400 mg at 10/30/17 1200  . metoprolol succinate (TOPROL-XL) 24 hr tablet 25 mg  25 mg Oral Daily Arta Silence, MD   25 mg at 10/30/17 1049  . morphine 2 MG/ML injection 1-2 mg  1-2 mg Intravenous Q3H PRN Arta Silence, MD   2 mg at 10/30/17 0211  . nicotine (NICODERM CQ - dosed in mg/24 hours) patch 21 mg  21 mg Transdermal Daily Arta Silence, MD   21 mg at 10/30/17 1049  . nitroGLYCERIN (NITROSTAT) SL tablet 0.4 mg  0.4 mg Sublingual Q5 min PRN Arta Silence, MD      . ondansetron (ZOFRAN) injection 4 mg  4 mg Intravenous Q6H PRN Arta Silence, MD      . pantoprazole (PROTONIX) EC tablet 40 mg  40 mg Oral BID Arta Silence, MD   40 mg at 10/30/17 1049  . pravastatin (PRAVACHOL) tablet 40 mg  40 mg Oral Daily Arta Silence, MD   40 mg at 10/30/17 1158  . promethazine (PHENERGAN) injection 12.5 mg  12.5 mg Intravenous Q6H PRN  Arta Silence, MD      . senna-docusate (Senokot-S) tablet 1 tablet  1 tablet Oral QHS PRN Arta Silence, MD      . sucralfate (CARAFATE) tablet 1 g  1 g Oral TID WC & HS Arta Silence, MD   1 g at 10/30/17 1428   Facility-Administered Medications Ordered in Other Encounters  Medication Dose Route Frequency Provider Last Rate Last Dose  . 0.9 %  sodium chloride infusion   Intravenous Continuous [name], MD   Stopped at 06/28/17 1450  . 0.9 %  sodium chloride infusion   Intravenous Continuous [name], MD   Stopped at 07/29/17 1515  . heparin lock flush 100 unit/mL  500 Units Intravenous Once Charlaine Dalton R, MD      . sodium chloride flush (NS) 0.9 % injection 10 mL  10 mL Intravenous PRN Charlaine Dalton R, MD      . sodium chloride flush (NS) 0.9 % injection 10 mL  10 mL Intravenous PRN Verlon Au, NP      . sodium chloride flush (NS) 0.9 % injection 10 mL  10 mL Intravenous PRN [name], MD   10 mL at 07/29/17 1405      .  PHYSICAL EXAMINATION:  Vitals:   10/30/17 1347 10/30/17 1641  BP: 108/65 106/61  Pulse: (!) 106 (!) 110  Resp: 18   Temp: (!) 97.5 F (36.4 C) (!) 97.5 F (36.4 C)  SpO2: 90% 99%   Filed Weights   10/29/17 2059  Weight: 128 lb (58.1 kg)    GENERAL: Thin built moderately poorly nourished female patient alert, no distress and comfortable.  Accompanied by husband/son.  On O2. EYES: Positive for pallor. OROPHARYNX: no thrush or ulceration. NECK: supple, no masses felt LYMPH:  no palpable lymphadenopathy in the cervical, axillary or inguinal regions LUNGS: decreased breath sounds to auscultation at bases and  No wheeze or crackles HEART/CVS: regular rate & rhythm and no murmurs; No lower extremity edema ABDOMEN: abdomen soft, non-tender and normal bowel sounds Musculoskeletal:no cyanosis of digits and no clubbing  PSYCH: alert & oriented x 3 with fluent speech NEURO: no focal  motor/sensory deficits SKIN: Multiple ecchymosis.   LABORATORY DATA:  I have reviewed the data as listed Lab Results  Component Value Date   WBC 1.8 (L) 10/29/2017   HGB 8.2 (L) 10/29/2017   HCT 23.9 (L) 10/29/2017   MCV 86.1 10/29/2017   PLT 70 (L) 10/29/2017   Recent Labs    08/05/17 1052  10/17/17 1851 10/18/17 0613 10/29/17 2106  NA 136   < > 138 137 135  K 2.8*   < > 3.6 3.8 4.1  CL 99*   < > 100 101 100  CO2 28   < > 31 29 26   GLUCOSE 143*   < > 134* 123* 191*  BUN 15   < > 19 18 27*  CREATININE 0.96   < > 0.85 0.84 1.11*  CALCIUM 7.8*   < > 8.6* 8.7* 8.9  GFRNONAA 59*   < > >60 >60 49*  GFRAA >60   < > >60 >60 57*  PROT 5.8*  --  5.3*  --  5.4*  ALBUMIN 2.4*  --  2.7*  --  2.8*  AST 21  --  37  --  104*  ALT 24  --  41  --  100*  ALKPHOS 51  --  97  --  96  BILITOT 0.6  --  0.3  --  0.9  BILIDIR  --   --   --   --  0.3*  IBILI  --   --   --   --  0.6   < > = values in this interval not displayed.    RADIOGRAPHIC STUDIES: I have personally reviewed the radiological images as listed and agreed with the findings in the report. Dg Chest 2 View  Result Date: 10/29/2017 CLINICAL DATA:  Chest pain EXAM: CHEST - 2 VIEW COMPARISON:  07/21/2017, PET-CT 06/03/2017 FINDINGS: Right-sided central venous port tip over the proximal right atrium. Small bilateral pleural effusions and bibasilar airspace disease. Stable cardiomediastinal silhouette with aortic atherosclerosis. No pneumothorax. IMPRESSION: Small bilateral pleural effusions with bibasilar and right middle lobe airspace disease suspicious for pneumonia. Electronically Signed   By: Donavan Foil M.D.   On: 10/29/2017 21:23   Ct Angio Chest Pe W And/or Wo Contrast  Result Date: 10/29/2017 CLINICAL DATA:  Chest pain.  Malignancy history. EXAM: CT ANGIOGRAPHY CHEST WITH CONTRAST TECHNIQUE: Multidetector CT imaging of the chest was performed using the standard protocol during bolus administration of intravenous contrast.  Multiplanar CT image reconstructions and MIPs were obtained to evaluate the vascular anatomy. CONTRAST:  46mL ISOVUE-370 IOPAMIDOL (ISOVUE-370) INJECTION 76% COMPARISON:  PET-CT 06/03/2017, chest CT 02/25/2017. Radiographs earlier this day. FINDINGS: Cardiovascular: There are no filling defects within the pulmonary arteries to suggest pulmonary embolus. Aortic atherosclerosis without aneurysm. No gross dissection allowing for phase of contrast. Mild right heart dilatation with contrast refluxing into the IVC. Small to moderate pericardial effusion measuring up to 14 mm in depth. Accessed right internal jugular port with tip in the distal SVC. Mediastinum/Nodes: Multiple small mediastinal nodes not enlarged by size criteria. Soft tissue density in the right hilum in  the region of bronchus intermedius and origin of the right mainstem bronchus. No left hilar adenopathy. No axillary adenopathy. Esophagus is decompressed, partially obscured by adjacent pleural fluid. Right axillary surgical clips, no axillary adenopathy. Lungs/Pleura: Moderate right and small to moderate left pleural effusion. Adjacent compressive atelectasis in both lower lobes. Patchy ground-glass opacities with mild septal thickening in both lower lobes may represent pulmonary edema. Near complete occlusion of the origin of the right middle lobe bronchus, unclear if this is secondary to bronchial filling or external compression, associated volume loss and patchy ground-glass opacities throughout the right middle lobe. There is improved aeration compared to 02/25/2017 CT but worsened since 06/03/2017 PET. Irregular left apical opacity has a linear morphology on axial imaging but spiculated nodular appearance on coronal reformats and measures approximately 1.5 x 1.8 x 1.3 cm, image 20 series 5. Pleural tail abuts the medial pleural. Small nodule in the anterior right upper lobe measuring approximately 8 x 3 mm is unchanged from prior exams, image 39  series 6. Mild upper lobe predominant emphysema. Upper Abdomen: No acute findings of the visualized upper abdomen. Musculoskeletal: No lytic or blastic osseous lesions. Scoliosis and degenerative change in the spine. Review of the MIP images confirms the above findings. IMPRESSION: 1. No pulmonary embolus. 2. Moderate right and small to moderate left pleural effusion. Patchy opacities with septal thickening in the lower lobes suspicious for pulmonary edema, in conjunction with contrast refluxing into the IVC. Findings suggesting mild congestive heart failure. 3. Small to moderate pericardial effusion. 4. Irregular left apical nodular opacity measuring up to 1.8 cm, new from prior PET. Given history of malignancy, this is nonspecific for infectious/inflammatory process versus metastasis. Consider repeat PET characterization versus follow-up CT in 3 months. 5. Recurrent partial collapse of the right middle lobe with near complete occlusion at the origin of the right middle lobe bronchus, unclear if this is due to intraluminal filling or external compression. Collapse was not present on most recent PET imaging but was seen on chest CT 8 months prior, suggesting this is intermittent. Aortic Atherosclerosis (ICD10-I70.0) and Emphysema (ICD10-J43.9). Electronically Signed   By: Jeb Levering M.D.   On: 10/29/2017 23:22   US Venous Img Lower Bilateral  Result Date: 10/17/2017 CLINICAL DATA:  72 year old female with bilateral lower extremity edema for the past 2 months EXAM: BILATERAL LOWER EXTREMITY VENOUS DOPPLER ULTRASOUND TECHNIQUE: Gray-scale sonography with graded compression, as well as color Doppler and duplex ultrasound were performed to evaluate the lower extremity deep venous systems from the level of the common femoral vein and including the common femoral, femoral, profunda femoral, popliteal and calf veins including the posterior tibial, peroneal and gastrocnemius veins when visible. The superficial  great saphenous vein was also interrogated. Spectral Doppler was utilized to evaluate flow at rest and with distal augmentation maneuvers in the common femoral, femoral and popliteal veins. COMPARISON:  None. FINDINGS: RIGHT LOWER EXTREMITY Common Femoral Vein: No evidence of thrombus. Normal compressibility, respiratory phasicity and response to augmentation. Increased pulsatility of the venous waveform. Saphenofemoral Junction: No evidence of thrombus. Normal compressibility and flow on color Doppler imaging. Profunda Femoral Vein: No evidence of thrombus. Normal compressibility and flow on color Doppler imaging. Femoral Vein: No evidence of thrombus. Normal compressibility, respiratory phasicity and response to augmentation. Popliteal Vein: No evidence of thrombus. Normal compressibility, respiratory phasicity and response to augmentation. Calf Veins: No evidence of thrombus. Normal compressibility and flow on color Doppler imaging. Superficial Great Saphenous Vein: No evidence of thrombus. Normal  compressibility. Venous Reflux:  None. Other Findings:  None. LEFT LOWER EXTREMITY Common Femoral Vein: No evidence of thrombus. Normal compressibility, respiratory phasicity and response to augmentation. Increased pulsatility of the venous waveform. Saphenofemoral Junction: No evidence of thrombus. Normal compressibility and flow on color Doppler imaging. Profunda Femoral Vein: No evidence of thrombus. Normal compressibility and flow on color Doppler imaging. Femoral Vein: No evidence of thrombus. Normal compressibility, respiratory phasicity and response to augmentation. Popliteal Vein: No evidence of thrombus. Normal compressibility, respiratory phasicity and response to augmentation. Calf Veins: No evidence of thrombus. Normal compressibility and flow on color Doppler imaging. Superficial Great Saphenous Vein: No evidence of thrombus. Normal compressibility. Venous Reflux:  None. Other Findings:  None. IMPRESSION:  1. No evidence of deep venous thrombosis in either lower extremity. 2. Increased pulsatility of the venous waveforms as can be seen in the setting of elevated right heart pressures. Differential considerations include tricuspid regurgitation, right-sided heart dysfunction/failure, pulmonary arterial hypertension and chronic COPD. Electronically Signed   By: Jacqulynn Cadet M.D.   On: 10/17/2017 16:51    ASSESSMENT & PLAN:   #72 year old female patient with a history of undiagnosed bone marrow failure syndrome; metastatic breast cancer-currently admitted the hospital for worsening shortness of breath chest pain/non-STEMI with acute CHF  #"Bone marrow failure syndrome"-status post multiple evaluations-without any clear diagnosis.  Patient needing PRBC blood transfusions/platelet transfusions [crossmatched] on weekly basis;and also Granix to keep the [place] greater than 1500.  #Metastatic breast cancer-ER PR positive letrozole; clinically under good control/no obvious progression.  #Acute CHF/non-STEMI-given the severe thrombocytopenia.  Patient has declined appropriately against catheterization.  I would recommend against any antiplatelet therapy given the severe risk of bleeding.  Medical management with statin/nitrates/ACE/beta blockers.  #Prognosis: Extremely poor; agree with DNR/DNI CODE STATUS.  Also discussed with the palliative care nurse.  Also discussed with patient's son and husband.  Also discussed about hospice; not ready yet as the biggest concern would be stopping transfusion of blood products.  Thank you Dr.Konidena for allowing me to participate in the care of your pleasant patient. Please do not hesitate to contact me with questions or concerns in the interim. Discussed with Dr.Konidena.   All questions were answered. The patient knows to call the clinic with any problems, questions or concerns.  # 60 minutes face-to-face with the patient discussing the above plan of care; more than  50% of time spent on prognosis/ natural history; counseling and coordination.    [name], MD 10/30/2017 4:46 PM

## 2017-10-30 NOTE — Consult Note (Signed)
Cardiology Consultation:   Patient ID: Shelly Arias; [id number]; Jul 20, 1945   Admit date: 10/29/2017 Date of Consult: 10/30/2017  Primary Care Provider: [name], MD Primary Cardiologist: New to [facility] - consult by End   Patient Profile:   Shelly Arias is a 72 y.o. 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 Vianne Bulls.  History of Present Illness:   Shelly Arias has no previously known cardiac history. She was recently admitted directly from the oncology clinic in early 10/2017 for right lower extremity cellulitis. She was felt to be volume up at that time as well. Echo was performed on 10/18/2017, though is not yet read. She was treated with antibiotics as well as a short course of Lasix for 14 days, which she completed on 10/27/2017. Patient and her daughter indicate it has been a steady downward decline since 06/2017. She is not able to swallow solid foods and has been having what she felt like was heartburn over the past several weeks. Patient was trying to eat dinner on 8/20, though could not swallow solid foods, so ate pudding only. Following this, she developed severe indigestion that radiated superiorly to her neck and developed chest discomfort that "felt like a toothache." There was some associated SOB, palpitations, diaphoresis, and nausea. She was brought to [facility] ED where her initial troponin was noted to be 0.77 and has currently trended to 5.5. She has been given IV morphine with resolution of her chest pain. She remains SOB. CTA chest is negative for PE, though  did show moderate right and small left pleural effusion with pulmonary edema as well as a small to moderate pericardial effusion, and new left apical pulmonary nodule as well as recurrent partial collapse of the right middle lobe with near complete occlusion at the origin of the right middle lobe bronchus. Patient's blood work showed a WBC 1.8, HGB 8.2, PLT 70 (up from 13 the day before s/p PLT transfusion on 8/20), Na 135, K+ 4.1, BUN 27, SCr 1.11 (baseline 0.8), albumin 2.8, AST 104, ALT 100, Mg++ 1.7, BNP 2675. She has not been started on heparin gtt or ASA given her severe comorbid conditions at the request of her oncologist. The patient and her family do not want aggressive interventions including medications (unless they will make the patient more comfortable) or interventions (speciically, they do not want a cardiac cath). She continues to note some lower extremity swelling (right > left) as well as 2-3 pillow orthopnea (which is new over the past couple of weeks). No current chest pain. She continues to require supplemental oxygen via nasal cannula.   Past Medical History:  Diagnosis Date  . Blood dyscrasia   . Breast cancer ([place])    right, lumpectomy, radiation, chemo  . Breast cancer ([place])    Right, 2007  . Breast cancer ([place]) 06/20/2016   INVASIVE DUCTAL CARCINOMA.  . Collapsed lung 02/14/2017   RIGHT  . Complication of anesthesia    PT STATED AFTER BIL HIP SURGERIES SHE STOPPED BREATHING THE NIGHT AFTER SURGERY  . COPD (chronic obstructive pulmonary disease) ([place])   . GERD (  gastroesophageal reflux disease)   . Headache   . History of chemotherapy   . History of methicillin resistant staphylococcus aureus (MRSA) 2011  . History of radiation therapy   . Hyperlipidemia   . Hypertension   . Liver cancer ([place]) 05/2016  . Lung cancer ([place]) 05/2016  . Osteoarthritis    right hip  . Osteoporosis   . Squamous cell carcinoma    leg, Followed by Dr. Nicole Kindred    Past Surgical History:    Procedure Laterality Date  . ABDOMINAL HYSTERECTOMY    . BREAST BIOPSY Left 2002   left breast, calcifications  . BREAST BIOPSY Left 06/20/2016   INVASIVE DUCTAL CARCINOMA.  Marland Kitchen BREAST EXCISIONAL BIOPSY Right 2007   positive  . bunion repair    . ESOPHAGOGASTRODUODENOSCOPY (EGD) WITH PROPOFOL N/A 08/05/2016   Procedure: ESOPHAGOGASTRODUODENOSCOPY (EGD) WITH PROPOFOL;  Surgeon: San Jetty, MD;  Location: [facility] ENDOSCOPY;  Service: General;  Laterality: N/A;  . IR FLUORO GUIDE CV LINE RIGHT  07/25/2017  . JOINT REPLACEMENT    . left breast biopsy    . NASAL SINUS SURGERY    . PORTACATH PLACEMENT Right 04/13/2017   Procedure: INSERTION PORT-A-CATH- RIGHT INTERNAL JUGULAR;  Surgeon: Robert Bellow, MD;  Location: [facility] ORS;  Service: General;  Laterality: Right;  . right hip replacement    . SHOULDER SURGERY    . SQUAMOUS CELL CARCINOMA EXCISION     right leg, Dr. Nicole Kindred  . TOTAL HIP ARTHROPLASTY     right     Home Meds: Prior to Admission medications   Medication Sig Start Date End Date Taking? Authorizing Provider  acyclovir (ZOVIRAX) 400 MG tablet TAKE 1 TABLET BY MOUTH TWICE A DAY 10/14/17  Yes [name], MD  ALPRAZolam Duanne Moron) 0.5 MG tablet Take 1 tablet (0.5 mg total) by mouth every 8 (eight) hours as needed for anxiety. 07/30/17  Yes [name], MD  cyanocobalamin (,VITAMIN B-12,) 1000 MCG/ML injection Inject 1,000 mcg into the muscle every 30 (thirty) days.   Yes [provider]  fexofenadine (ALLEGRA) 180 MG tablet Take 180 mg by mouth every morning.    Yes [provider]  fluconazole (DIFLUCAN) 200 MG tablet Take 1 tablet (200 mg total) by mouth daily. 10/25/17  Yes [name], MD  Glucosamine-Chondroitin (COSAMIN DS) 500-400 MG CAPS Take 1 tablet by mouth every morning.    Yes [provider]  letrozole (FEMARA) 2.5 MG tablet TAKE 1 TABLET (2.5 MG TOTAL) BY MOUTH DAILY. ONCE A DAY. 09/18/17  Yes [name],  MD  levofloxacin (LEVAQUIN) 500 MG tablet Take 500 mg by mouth daily.   Yes [provider]  metoprolol succinate (TOPROL-XL) 25 MG 24 hr tablet Take 1 tablet (25 mg total) by mouth daily. 05/02/17  Yes [name], MD  pantoprazole (PROTONIX) 40 MG tablet Take 1 tablet (40 mg total) by mouth 2 (two) times daily. 10/23/17  Yes [name], MD  Probiotic Product (PROBIOTIC & ACIDOPHILUS EX ST PO) Take 1 tablet by mouth daily.    Yes [provider]  ranitidine (ZANTAC) 75 MG tablet Take 75 mg by mouth daily as needed for heartburn.    Yes [provider]  Vitamin D, Ergocalciferol, (DRISDOL) 50000 units CAPS capsule TAKE 1 CAPSULE (50,000 UNITS TOTAL) BY MOUTH EVERY SATURDAY. IN THE MORNING. 08/12/17  Yes [name], MD  megestrol (MEGACE ES) 625 MG/5ML suspension Take 5 mLs (625 mg total) by mouth 2 (  two) times daily. 10/28/17   [name], MD  nicotine (NICODERM CQ - DOSED IN MG/24 HOURS) 21 mg/24hr patch Place 1 patch (21 mg total) onto the skin daily. Patient not taking: Reported on 10/28/2017 10/19/17   Demetrios Loll, MD  pravastatin (PRAVACHOL) 40 MG tablet Take 1 tablet (40 mg total) by mouth daily. Patient not taking: Reported on 10/28/2017 05/02/17   [name], MD  prochlorperazine (COMPAZINE) 10 MG tablet Take 1 tablet (10 mg total) by mouth every 6 (six) hours as needed for nausea or vomiting. Patient not taking: Reported on 10/28/2017 07/15/17   Verlon Au, NP  sucralfate (CARAFATE) 1 g tablet Take 1 tablet (1 g total) by mouth 4 (four) times daily -  with meals and at bedtime. Patient not taking: Reported on 10/28/2017 10/28/17   Verlon Au, NP    Inpatient Medications: Scheduled Meds: . acyclovir  400 mg Oral BID  . [START ON 10/31/2017] aspirin EC  81 mg Oral Daily  . fluconazole  200 mg Oral Daily  . letrozole  2.5 mg Oral Daily  . levofloxacin  500 mg Oral Daily  . megestrol  400 mg Oral BID  . metoprolol  succinate  25 mg Oral Daily  . nicotine  21 mg Transdermal Daily  . pantoprazole  40 mg Oral BID  . pravastatin  40 mg Oral Daily  . sucralfate  1 g Oral TID WC & HS   Continuous Infusions:  PRN Meds: acetaminophen, ALPRAZolam, alum & mag hydroxide-simeth, bisacodyl, famotidine, guaiFENesin, ipratropium-albuterol, morphine injection, nitroGLYCERIN, ondansetron (ZOFRAN) IV, promethazine, senna-docusate  Allergies:   Allergies  Allergen Reactions  . Codeine Anaphylaxis  . Fish-Derived Products Anaphylaxis  . Morphine Sulfate Other (See Comments)    REACTION: Hypotension  . Adhesive [Tape]     PAPER TAPE OK TO USE  . Oxycodone     Nausea and vomiting    Social History:   Social History   Socioeconomic History  . Marital status: Married    Spouse name: Not on file  . Number of children: Not on file  . Years of education: Not on file  . Highest education level: Not on file  Occupational History  . Not on file  Social Needs  . Financial resource strain: Not hard at all  . Food insecurity:    Worry: Never true    Inability: Never true  . Transportation needs:    Medical: No    Non-medical: No  Tobacco Use  . Smoking status: Current Some Day Smoker    Packs/day: 0.25    Years: 30.00    Pack years: 7.50    Types: Cigarettes  . Smokeless tobacco: Never Used  Substance and Sexual Activity  . Alcohol use: Not Currently    Alcohol/week: 0.0 standard drinks    Comment: socially  . Drug use: No  . Sexual activity: Not Currently  Lifestyle  . Physical activity:    Days per week: 4 days    Minutes per session: 30 min  . Stress: Not at all  Relationships  . Social connections:    Talks on phone: Three times a week    Gets together: Twice a week    Attends religious service: More than 4 times per year    Active member of club or organization: No    Attends meetings of clubs or organizations: Never    Relationship status: Married  . Intimate partner violence:    Fear  of  current or ex partner: No    Emotionally abused: No    Physically abused: No    Forced sexual activity: No  Other Topics Concern  . Not on file  Social History Narrative  . Not on file     Family History:   Family History  Problem Relation Age of Onset  . Heart disease Father 27  . Heart disease Mother   . Hyperlipidemia Mother   . Heart disease Brother   . Diabetes Brother   . Pancreatic cancer Brother   . Diabetes Maternal Grandmother   . Breast cancer Cousin   . Kidney disease Brother     ROS:  Review of Systems  Constitutional: Positive for malaise/fatigue and weight loss. Negative for chills, diaphoresis and fever.       Poor PO intake, unable to swallow solid foods  HENT: Negative for congestion.   Eyes: Negative for discharge and redness.  Respiratory: Positive for cough and shortness of breath. Negative for hemoptysis, sputum production and wheezing.   Cardiovascular: Positive for chest pain and leg swelling. Negative for palpitations, orthopnea, claudication and PND.  Gastrointestinal: Positive for heartburn. Negative for abdominal pain, blood in stool, melena, nausea and vomiting.  Genitourinary: Negative for hematuria.  Musculoskeletal: Negative for falls and myalgias.  Skin: Positive for rash.  Neurological: Positive for weakness. Negative for dizziness, tingling, tremors, sensory change, speech change, focal weakness and loss of consciousness.  Endo/Heme/Allergies: Does not bruise/bleed easily.  Psychiatric/Behavioral: Negative for substance abuse. The patient is not nervous/anxious.   All other systems reviewed and are negative.     Physical Exam/Data:   Vitals:   10/30/17 0614 10/30/17 0700 10/30/17 0800 10/30/17 0900  BP: (!) 125/112  123/83 123/83  Pulse: (!) 107     Resp: (!) 22  (!) 26 (!) 23  Temp:      TempSrc:      SpO2: 99% 100% 100% 97%  Weight:      Height:       No intake or output data in the 24 hours ending 10/30/17 0958 Filed  Weights   10/29/17 2059  Weight: 58.1 kg   Body mass index is 24.19 kg/m.   Physical Exam: General: Frail, and chronically ill appearing, in no acute distress. Head: Normocephalic, atraumatic, sclera non-icteric, no xanthomas, nares without discharge.  Neck: Negative for carotid bruits. JVD elevated. Lungs: Diminished breath sounds bilaterally with faint bibasilar crackles. Breathing is unlabored on supplemental oxygen.  Heart: Tachycardic with S1 S2. No murmurs, rubs, or gallops appreciated. Abdomen: Soft, non-tender, non-distended with normoactive bowel sounds. No hepatomegaly. No rebound/guarding. No obvious abdominal masses. Msk:  Strength and tone appear normal for age. Extremities: No clubbing or cyanosis. Trace to 1+ bilateral lower extremity pitting edema, right > left with resolving erythema affecting the anterior aspect of the right shin. Distal pedal pulses are 2+ and equal bilaterally. Neuro: Alert and oriented X 3. No facial asymmetry. No focal deficit. Moves all extremities spontaneously. Psych:  Responds to questions appropriately with a normal affect.   EKG:  The EKG was personally reviewed and demonstrates: sinus tachycardia, 126 bpm, LBBB (known from 07/2017) Telemetry:  Telemetry was personally reviewed and demonstrates: sinus tachycardia   Weights: Filed Weights   10/29/17 2059  Weight: 58.1 kg    Relevant CV Studies: Echo from 10/18/2017 read is pending in Epic. Preliminary review with MD showed an EF of ~ 25-30%.   Laboratory Data:  Chemistry Recent Labs  Lab 10/29/17 2106  NA 135  K 4.1  CL 100  CO2 26  GLUCOSE 191*  BUN 27*  CREATININE 1.11*  CALCIUM 8.9  GFRNONAA 49*  GFRAA 57*  ANIONGAP 9    Recent Labs  Lab 10/29/17 2106  PROT 5.4*  ALBUMIN 2.8*  AST 104*  ALT 100*  ALKPHOS 96  BILITOT 0.9   Hematology Recent Labs  Lab 10/24/17 1049 10/28/17 1346 10/29/17 2106  WBC 1.8* 1.6* 1.8*  RBC 2.52* 2.75* 2.78*  HGB 7.5* 8.1* 8.2*    HCT 21.6* 23.7* 23.9*  MCV 85.5 86.3 86.1  MCH 29.6 29.5 29.5  MCHC 34.6 34.2 34.3  RDW 13.6 13.8 13.9  PLT 6* 13* 70*   Cardiac Enzymes Recent Labs  Lab 10/29/17 2106 10/30/17 0330 10/30/17 0753  TROPONINI 0.73*  0.77* 3.26* 5.48*   No results for input(s): TROPIPOC in the last 168 hours.  BNP Recent Labs  Lab 10/30/17 0753  BNP 2,675.0*    DDimer No results for input(s): DDIMER in the last 168 hours.  Radiology/Studies:  Dg Chest 2 View  Result Date: 10/29/2017 IMPRESSION: Small bilateral pleural effusions with bibasilar and right middle lobe airspace disease suspicious for pneumonia. Electronically Signed   By: Donavan Foil M.D.   On: 10/29/2017 21:23   Ct Angio Chest Pe W And/or Wo Contrast  Result Date: 10/29/2017 IMPRESSION: 1. No pulmonary embolus. 2. Moderate right and small to moderate left pleural effusion. Patchy opacities with septal thickening in the lower lobes suspicious for pulmonary edema, in conjunction with contrast refluxing into the IVC. Findings suggesting mild congestive heart failure. 3. Small to moderate pericardial effusion. 4. Irregular left apical nodular opacity measuring up to 1.8 cm, new from prior PET. Given history of malignancy, this is nonspecific for infectious/inflammatory process versus metastasis. Consider repeat PET characterization versus follow-up CT in 3 months. 5. Recurrent partial collapse of the right middle lobe with near complete occlusion at the origin of the right middle lobe bronchus, unclear if this is due to intraluminal filling or external compression. Collapse was not present on most recent PET imaging but was seen on chest CT 8 months prior, suggesting this is intermittent. Aortic Atherosclerosis (ICD10-I70.0) and Emphysema (ICD10-J43.9). Electronically Signed   By: Jeb Levering M.D.   On: 10/29/2017 23:22    Assessment and Plan:   1. NSTEMI: -Currently chest pain free -Continue to cycle troponin unitl peak -Not a  candidate for heparin gtt or ASA given her bone marrow failrue per oncology -Recent echo with preliminary report of EF 25-30%, no indication to repeat at this time as it will not change her overall treatment course or prognosis  -Not a candidate for cardiac catheterization given her severe comorbid conditions (patient and family do not want these either)  2. Acute on chronic systolic CHF likely secondary to ICM though cannot exclude mixed cardiomyopathy given her history: -Start IV Lasix with KCl repletion as needed -Continue PTA Toprol XL (will not escalate at this time) -As/if renal function improves with diuresis consider addition of ACEi/ARB/spironolactone, though this will not change her overall prognosis -Daily weights -Await formal read on echo from 10/18/2017  3. Stage IV recurrent metastatic breast cancer: -Per oncology   4. 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 Failure to thrive: -Cannot eat solid foods -Sustaining on liquids and pudding -Recommend  dietary consult -May need to supplement with IV albumin   6. Multiorgan failure: -In the setting of the above -Poor prognosis   Dispo: -Overall, extremely poor prognosis -Not a candidate for invasive cardiac procedures -Recommend palliative care consult with focus on comfort -It remains unclear as to the insight of the patient and family of the grave prognosis    For questions or updates, please contact [place] Please consult [email or web address] for contact info under Cardiology/STEMI.   Signed, Christell Faith, PA-C [facility] Pager: [phone] 10/30/2017, 9:58 AM

## 2017-10-30 NOTE — Care Management Note (Signed)
Case Management Note  Patient Details  Name: Shelly Arias MRN: [id number] Date of Birth: 03/18/1945  Subjective/Objective:                 Patient has had 4 admissions within the past 6 months due to her metastatic breast cancer, bone marrow failure / severe thrombocytopenia. She is admitted now with nstemi and heart failure.  She is currently a DNR but "not ready" for hospice yet. She may benefit from home health follow up with outpatient palliative to decrease admissions.  She is compliant with her treatment plan.  Has declined further cardiac work up and intervention.  Palliative consult is pending  Action/Plan:   Expected Discharge Date:                  Expected Discharge Plan:     In-House Referral:     Discharge planning Services     Post Acute Care Choice:    Choice offered to:     DME Arranged:    DME Agency:     HH Arranged:    HH Agency:     Status of Service:     If discussed at [employer] of Stay Meetings, dates discussed:    Additional Comments:  Katrina Stack, RN 10/30/2017, 5:36 PM

## 2017-10-30 NOTE — Progress Notes (Addendum)
Family Meeting Note  Advance Directive:yes  Today a meeting took place with the Patient.  Discussed with pts sone who is a veterenerian in [place] area ,explained about poor prognosis due to present [place] her MDS<bonemarrow failure syndrome,they all agree and made her DNR/DNI;spoke with DR.Brahmanday and Dr.End ,also palliative care nurse,now she is DNR/DNI.but pt not ready to consider hospice yet due to blood transfusions  Discussed about code status by admitting MD,again discussed same with pts son. The following clinical team members were present during this meeting:MD  The following were discussed:Patient's diagnosis: , Patient's progosis: extremely poor,high risk for cardiac arrest. Additional follow-up to be provided: will follow  Time spent during discussion:17 min Epifanio Lesches, MD

## 2017-10-30 NOTE — ED Notes (Signed)
Pt asleep with lights turned off to enhance rest. Family at the bedside.

## 2017-10-31 ENCOUNTER — Other Ambulatory Visit: Payer: Self-pay

## 2017-10-31 ENCOUNTER — Inpatient Hospital Stay: Payer: Medicare Other

## 2017-10-31 ENCOUNTER — Ambulatory Visit: Payer: Medicare Other | Admitting: Family Medicine

## 2017-10-31 ENCOUNTER — Other Ambulatory Visit: Payer: Self-pay | Admitting: Internal Medicine

## 2017-10-31 DIAGNOSIS — C50811 Malignant neoplasm of overlapping sites of right female breast: Secondary | ICD-10-CM

## 2017-10-31 DIAGNOSIS — Z515 Encounter for palliative care: Secondary | ICD-10-CM

## 2017-10-31 DIAGNOSIS — M898X8 Other specified disorders of bone, other site: Secondary | ICD-10-CM

## 2017-10-31 DIAGNOSIS — Z170 Estrogen receptor positive status [ER+]: Principal | ICD-10-CM

## 2017-10-31 DIAGNOSIS — Z7189 Other specified counseling: Secondary | ICD-10-CM

## 2017-10-31 DIAGNOSIS — I214 Non-ST elevation (NSTEMI) myocardial infarction: Secondary | ICD-10-CM

## 2017-10-31 DIAGNOSIS — I21A1 Myocardial infarction type 2: Secondary | ICD-10-CM

## 2017-10-31 DIAGNOSIS — R748 Abnormal levels of other serum enzymes: Secondary | ICD-10-CM

## 2017-10-31 LAB — BPAM PLATELET PHERESIS
Blood Product Expiration Date: 201908201430
ISSUE DATE / TIME: 201908201351
Unit Type and Rh: 5100

## 2017-10-31 LAB — CBC WITH DIFFERENTIAL/PLATELET
Basophils Absolute: 0 10*3/uL (ref 0–0.1)
Basophils Relative: 0 %
EOS ABS: 0 10*3/uL (ref 0–0.7)
EOS PCT: 0 %
HCT: 22 % — ABNORMAL LOW (ref 35.0–47.0)
HEMOGLOBIN: 7.6 g/dL — AB (ref 12.0–16.0)
LYMPHS ABS: 0.6 10*3/uL — AB (ref 1.0–3.6)
Lymphocytes Relative: 46 %
MCH: 29.5 pg (ref 26.0–34.0)
MCHC: 34.7 g/dL (ref 32.0–36.0)
MCV: 84.9 fL (ref 80.0–100.0)
MONOS PCT: 4 %
Monocytes Absolute: 0.1 10*3/uL — ABNORMAL LOW (ref 0.2–0.9)
Neutro Abs: 0.7 10*3/uL — ABNORMAL LOW (ref 1.4–6.5)
Neutrophils Relative %: 50 %
Platelets: 34 10*3/uL — ABNORMAL LOW (ref 150–440)
RBC: 2.59 MIL/uL — ABNORMAL LOW (ref 3.80–5.20)
RDW: 13.7 % (ref 11.5–14.5)
WBC: 1.4 10*3/uL — CL (ref 3.6–11.0)

## 2017-10-31 LAB — PREPARE PLATELET PHERESIS: UNIT DIVISION: 0

## 2017-10-31 LAB — ECHOCARDIOGRAM COMPLETE
HEIGHTINCHES: 61 in
WEIGHTICAEL: 2032 [oz_av]

## 2017-10-31 LAB — PREPARE RBC (CROSSMATCH)

## 2017-10-31 MED ORDER — FLUTICASONE FUROATE-VILANTEROL 100-25 MCG/INH IN AEPB
1.0000 | INHALATION_SPRAY | Freq: Every day | RESPIRATORY_TRACT | Status: DC
  Administered 2017-10-31 – 2017-11-05 (×6): 1 via RESPIRATORY_TRACT
  Filled 2017-10-31: qty 28

## 2017-10-31 MED ORDER — ADULT MULTIVITAMIN W/MINERALS CH
1.0000 | ORAL_TABLET | Freq: Every day | ORAL | Status: DC
  Administered 2017-10-31 – 2017-11-04 (×5): 1 via ORAL
  Filled 2017-10-31 (×5): qty 1

## 2017-10-31 MED ORDER — IPRATROPIUM-ALBUTEROL 0.5-2.5 (3) MG/3ML IN SOLN
3.0000 mL | RESPIRATORY_TRACT | Status: DC
  Administered 2017-10-31 – 2017-11-01 (×7): 3 mL via RESPIRATORY_TRACT
  Filled 2017-10-31 (×9): qty 3

## 2017-10-31 MED ORDER — SUCRALFATE 1 GM/10ML PO SUSP
1.0000 g | Freq: Three times a day (TID) | ORAL | Status: DC
  Administered 2017-10-31 – 2017-11-06 (×24): 1 g via ORAL
  Filled 2017-10-31 (×26): qty 10

## 2017-10-31 MED ORDER — SODIUM CHLORIDE 0.9% IV SOLUTION
Freq: Once | INTRAVENOUS | Status: AC
  Administered 2017-10-31: 19:00:00 via INTRAVENOUS

## 2017-10-31 MED ORDER — TBO-FILGRASTIM 480 MCG/0.8ML ~~LOC~~ SOSY
480.0000 ug | PREFILLED_SYRINGE | Freq: Every day | SUBCUTANEOUS | Status: DC
  Administered 2017-10-31 – 2017-11-05 (×6): 480 ug via SUBCUTANEOUS
  Filled 2017-10-31 (×7): qty 0.8

## 2017-10-31 MED ORDER — FUROSEMIDE 10 MG/ML IJ SOLN
20.0000 mg | Freq: Once | INTRAMUSCULAR | Status: AC
  Administered 2017-10-31: 20 mg via INTRAVENOUS
  Filled 2017-10-31: qty 2

## 2017-10-31 MED ORDER — METOPROLOL SUCCINATE ER 50 MG PO TB24
50.0000 mg | ORAL_TABLET | Freq: Every day | ORAL | Status: DC
  Administered 2017-10-31 – 2017-11-06 (×7): 50 mg via ORAL
  Filled 2017-10-31 (×7): qty 1

## 2017-10-31 MED ORDER — VITAMIN C 500 MG PO TABS
250.0000 mg | ORAL_TABLET | Freq: Two times a day (BID) | ORAL | Status: DC
  Administered 2017-10-31 – 2017-11-05 (×10): 250 mg via ORAL
  Filled 2017-10-31 (×12): qty 0.5

## 2017-10-31 NOTE — Progress Notes (Signed)
Patient receiving 1unit of blood by verbal with read back orders from Dr. Rogue Bussing. Per verbal orders pt to receive 20mg  IV lasix before and 20mg  IV lasix after blood transfusion. 20mg  IV lasix given before transfusion started along with PRN tylenol and benadryl per patient request. Patient stated this is what she gets outpatient before all of her transfusions. Patient has 20mg  IV lasix scheduled tonight, so will not put in additional order. Oncoming nurse will continue to monitor.

## 2017-10-31 NOTE — Progress Notes (Signed)
Thera Basden   DOB:1945/11/14   BJ#:[id number]    Subjective: Patient had difficulty sleeping last night because of shortness of breath.  Denies any chest pain.  Positive for cough.  No hemoptysis.  No nausea no vomiting.  Objective:  Vitals:   10/31/17 2007 10/31/17 2028  BP: 109/71   Pulse: (!) 103   Resp: 17   Temp: 98.6 F (37 C)   SpO2: 99% 97%     Intake/Output Summary (Last 24 hours) at 10/31/2017 2101 Last data filed at 10/31/2017 1902 Gross per 24 hour  Intake 240 ml  Output 350 ml  Net -110 ml   GENERAL: Thin built moderately poorly nourished female patient alert, no distress and comfortable.  Accompanied by husband/son/daughter.  On O2. EYES: Positive for pallor. OROPHARYNX: no thrush or ulceration. NECK: supple, no masses felt LYMPH:  no palpable lymphadenopathy in the cervical, axillary or inguinal regions LUNGS: decreased breath sounds to auscultation at bases.  Possible wheezing on the right side. HEART/CVS: regular rate & rhythm and no murmurs; No lower extremity edema ABDOMEN: abdomen soft, non-tender and normal bowel sounds Musculoskeletal:no cyanosis of digits and no clubbing  PSYCH: alert & oriented x 3 with fluent speech NEURO: no focal motor/sensory deficits SKIN: Multiple ecchymosis.   Labs:  Lab Results  Component Value Date   WBC 1.4 (LL) 10/31/2017   HGB 7.6 (L) 10/31/2017   HCT 22.0 (L) 10/31/2017   MCV 84.9 10/31/2017   PLT 34 (L) 10/31/2017   NEUTROABS 0.7 (L) 10/31/2017    Lab Results  Component Value Date   NA 135 10/29/2017   K 4.1 10/29/2017   CL 100 10/29/2017   CO2 26 10/29/2017    Studies:  Dg Chest 2 View  Result Date: 10/29/2017 CLINICAL DATA:  Chest pain EXAM: CHEST - 2 VIEW COMPARISON:  07/21/2017, PET-CT 06/03/2017 FINDINGS: Right-sided central venous port tip over the proximal right atrium. Small bilateral pleural effusions and bibasilar airspace disease. Stable cardiomediastinal silhouette with aortic  atherosclerosis. No pneumothorax. IMPRESSION: Small bilateral pleural effusions with bibasilar and right middle lobe airspace disease suspicious for pneumonia. Electronically Signed   By: Donavan Foil M.D.   On: 10/29/2017 21:23   Ct Head Wo Contrast  Result Date: 10/31/2017 CLINICAL DATA:  72 year old female with severe headache today. History of breast cancer. EXAM: CT HEAD WITHOUT CONTRAST TECHNIQUE: Contiguous axial images were obtained from the base of the skull through the vertex without intravenous contrast. COMPARISON:  07/03/2016 brain MR FINDINGS: Brain: No evidence of acute infarction, hemorrhage, hydrocephalus, extra-axial collection or mass lesion/mass effect. Atrophy and mild chronic small-vessel white matter ischemic changes again noted. Vascular: No hyperdense vessel or unexpected calcification. Skull: Normal. Negative for fracture or focal lesion. Sinuses/Orbits: No acute finding. Other: None. IMPRESSION: 1. No evidence of acute intracranial abnormality 2. Atrophy and mild chronic small-vessel white matter ischemic changes. Electronically Signed   By: Margarette Canada M.D.   On: 10/31/2017 13:30   Ct Angio Chest Pe W And/or Wo Contrast  Result Date: 10/29/2017 CLINICAL DATA:  Chest pain.  Malignancy history. EXAM: CT ANGIOGRAPHY CHEST WITH CONTRAST TECHNIQUE: Multidetector CT imaging of the chest was performed using the standard protocol during bolus administration of intravenous contrast. Multiplanar CT image reconstructions and MIPs were obtained to evaluate the vascular anatomy. CONTRAST:  49mL ISOVUE-370 IOPAMIDOL (ISOVUE-370) INJECTION 76% COMPARISON:  PET-CT 06/03/2017, chest CT 02/25/2017. Radiographs earlier this day. FINDINGS: Cardiovascular: There are no filling defects within the pulmonary arteries to suggest pulmonary  embolus. Aortic atherosclerosis without aneurysm. No gross dissection allowing for phase of contrast. Mild right heart dilatation with contrast refluxing into the IVC.  Small to moderate pericardial effusion measuring up to 14 mm in depth. Accessed right internal jugular port with tip in the distal SVC. Mediastinum/Nodes: Multiple small mediastinal nodes not enlarged by size criteria. Soft tissue density in the right hilum in the region of bronchus intermedius and origin of the right mainstem bronchus. No left hilar adenopathy. No axillary adenopathy. Esophagus is decompressed, partially obscured by adjacent pleural fluid. Right axillary surgical clips, no axillary adenopathy. Lungs/Pleura: Moderate right and small to moderate left pleural effusion. Adjacent compressive atelectasis in both lower lobes. Patchy ground-glass opacities with mild septal thickening in both lower lobes may represent pulmonary edema. Near complete occlusion of the origin of the right middle lobe bronchus, unclear if this is secondary to bronchial filling or external compression, associated volume loss and patchy ground-glass opacities throughout the right middle lobe. There is improved aeration compared to 02/25/2017 CT but worsened since 06/03/2017 PET. Irregular left apical opacity has a linear morphology on axial imaging but spiculated nodular appearance on coronal reformats and measures approximately 1.5 x 1.8 x 1.3 cm, image 20 series 5. Pleural tail abuts the medial pleural. Small nodule in the anterior right upper lobe measuring approximately 8 x 3 mm is unchanged from prior exams, image 39 series 6. Mild upper lobe predominant emphysema. Upper Abdomen: No acute findings of the visualized upper abdomen. Musculoskeletal: No lytic or blastic osseous lesions. Scoliosis and degenerative change in the spine. Review of the MIP images confirms the above findings. IMPRESSION: 1. No pulmonary embolus. 2. Moderate right and small to moderate left pleural effusion. Patchy opacities with septal thickening in the lower lobes suspicious for pulmonary edema, in conjunction with contrast refluxing into the IVC.  Findings suggesting mild congestive heart failure. 3. Small to moderate pericardial effusion. 4. Irregular left apical nodular opacity measuring up to 1.8 cm, new from prior PET. Given history of malignancy, this is nonspecific for infectious/inflammatory process versus metastasis. Consider repeat PET characterization versus follow-up CT in 3 months. 5. Recurrent partial collapse of the right middle lobe with near complete occlusion at the origin of the right middle lobe bronchus, unclear if this is due to intraluminal filling or external compression. Collapse was not present on most recent PET imaging but was seen on chest CT 8 months prior, suggesting this is intermittent. Aortic Atherosclerosis (ICD10-I70.0) and Emphysema (ICD10-J43.9). Electronically Signed   By: Jeb Levering M.D.   On: 10/29/2017 23:22    Assessment & Plan:   ASSESSMENT & PLAN:   # 72 year old female patient with a history of undiagnosed bone marrow failure syndrome; metastatic breast cancer-currently admitted the hospital for worsening shortness of breath chest pain/non-STEMI with acute CHF  #"Bone marrow failure syndrome"-status post multiple evaluations-without any clear diagnosis.  Platelets/PRBC transfusion dependent.  Today platelets are 33 hemoglobin 7.5; recommend 1 unit of PRBC transfusion; with Lasix 20 mg IV pre-and post transfusion.  Continue Granix.  #Acute CHF/non-STEMI-patient not a candidate for any antiplatelet/antithrombotic therapy.  Continue diuresis.  Continue statin nitrates; with blocker.  Recommend morphine for air hunger.  Discussed with cardiology.  #Metastatic breast cancer-ER PR positive letrozole; clinically under good control/no obvious progression.  #Overall prognosis extremely poor.  Discussed with Dr. Vianne Bulls.  Also discussed with cardiology.  [name], MD 10/31/2017  9:01 PM

## 2017-10-31 NOTE — Progress Notes (Signed)
Pt blood transfusion was finshed and pt tolerated it very well. VSS. Will continue to monitor.

## 2017-10-31 NOTE — Progress Notes (Signed)
Patient c/o SOB. Oxygen saturation 100% on 3L [place]. Wheezing in bilateral lungs. Duoneb given. Patient did report some improvement. All other vss. Dr. Marcille Blanco notified. Duonebs changed to scheduled and Breo Ellipta ordered.  Wilnette Kales

## 2017-10-31 NOTE — Progress Notes (Signed)
CRITICAL VALUE ALERT  Critical Value:  WBC 1.4  Date & Time Notied:  10/31/17 091  Provider Notified: Dr.Konidena   Orders Received/Actions taken: Awaiting for call back

## 2017-10-31 NOTE — Progress Notes (Signed)
N/c at 2lpm placed back on

## 2017-10-31 NOTE — Progress Notes (Signed)
[facility] [facility] NAME: Shelly Arias    MR#:  [id number]  DATE OF BIRTH:  28-Jan-1946  SUBJECTIVE: Admitted because of chest pain, non-ST elevation MI.  No chest pain now, because of tachycardia started on beta-blockers, patient has pedal edema, elevated BNP so started on Lasix.  Complained of some cloudy vision, CT head unremarkable.  Patient has history of halos, floaters, chronic migraines.  CHIEF COMPLAINT:   Chief Complaint  Patient presents with  . Chest Pain    REVIEW OF SYSTEMS:   ROS CONSTITUTIONAL: No fever, fatigue, appears pale, appears chronically ill.  Cachectic.  EYES: No blurred or double vision.  EARS, NOSE, AND THROAT: No tinnitus or ear pain.  RESPIRATORY: No cough, shortness of breath, wheezing or hemoptysis.  CARDIOVASCULAR: No chest pain, orthopnea, edema.  GASTROINTESTINAL: No nausea, vomiting, diarrhea or abdominal pain.  GENITOURINARY: No dysuria, hematuria.  ENDOCRINE: No polyuria, nocturia,  HEMATOLOGY: No anemia, easy bruising or bleeding SKIN: No rash or lesion. MUSCULOSKELETAL: Edema present.  NEUROLOGIC: No tingling, numbness, weakness.  PSYCHIATRY: No anxiety or depression.   DRUG ALLERGIES:   Allergies  Allergen Reactions  . Codeine Anaphylaxis  . Fish-Derived Products Anaphylaxis  . Morphine Sulfate Other (See Comments)    REACTION: Hypotension  . Adhesive [Tape]     PAPER TAPE OK TO USE  . Oxycodone     Nausea and vomiting    VITALS:  Blood pressure 107/71, pulse (!) 107, temperature 97.7 F (36.5 C), temperature source Oral, resp. rate 18, height 5\' 1"  (1.549 m), weight 60.4 kg, SpO2 99 %.  PHYSICAL EXAMINATION:  GENERAL:  72 y.o.-year-old patient lying in the bed with no acute distress.  EYES: Pupils equal, round, reactive to light and accommodation. No scleral icterus. Extraocular muscles intact.  HEENT: Head atraumatic, normocephalic. Oropharynx and nasopharynx clear.  NECK:   Supple, no jugular venous distention. No thyroid enlargement, no tenderness.  LUNGS: Normal breath sounds bilaterally, no wheezing, rales,rhonchi or crepitation. No use of accessory muscles of respiration.  CARDIOVASCULAR: S1, S2 normal. No murmurs, rubs, or gallops.  ABDOMEN: Soft, nontender, nondistended. Bowel sounds present. No organomegaly or mass.  EXTREMITIES: decreasedleg edema. NEUROLOGIC: Cranial nerves II through XII are intact. Muscle strength 5/5 in all extremities. Sensation intact. Gait not checked.  PSYCHIATRIC: The patient is alert and oriented x 3.  SKIN: No obvious rash, lesion, or ulcer.    LABORATORY PANEL:   CBC Recent Labs  Lab 10/31/17 0856  WBC 1.4*  HGB 7.6*  HCT 22.0*  PLT 34*   ------------------------------------------------------------------------------------------------------------------  Chemistries  Recent Labs  Lab 10/29/17 2106  NA 135  K 4.1  CL 100  CO2 26  GLUCOSE 191*  BUN 27*  CREATININE 1.11*  CALCIUM 8.9  MG 1.7  AST 104*  ALT 100*  ALKPHOS 96  BILITOT 0.9   ------------------------------------------------------------------------------------------------------------------  Cardiac Enzymes Recent Labs  Lab 10/30/17 0753  TROPONINI 5.48*   ------------------------------------------------------------------------------------------------------------------  RADIOLOGY:  Dg Chest 2 View  Result Date: 10/29/2017 CLINICAL DATA:  Chest pain EXAM: CHEST - 2 VIEW COMPARISON:  07/21/2017, PET-CT 06/03/2017 FINDINGS: Right-sided central venous port tip over the proximal right atrium. Small bilateral pleural effusions and bibasilar airspace disease. Stable cardiomediastinal silhouette with aortic atherosclerosis. No pneumothorax. IMPRESSION: Small bilateral pleural effusions with bibasilar and right middle lobe airspace disease suspicious for pneumonia. Electronically Signed   By: Donavan Foil M.D.   On: 10/29/2017 21:23   Ct Head Wo  Contrast  Result Date: 10/31/2017 CLINICAL DATA:  72 year old female with severe headache today. History of breast cancer. EXAM: CT HEAD WITHOUT CONTRAST TECHNIQUE: Contiguous axial images were obtained from the base of the skull through the vertex without intravenous contrast. COMPARISON:  07/03/2016 brain MR FINDINGS: Brain: No evidence of acute infarction, hemorrhage, hydrocephalus, extra-axial collection or mass lesion/mass effect. Atrophy and mild chronic small-vessel white matter ischemic changes again noted. Vascular: No hyperdense vessel or unexpected calcification. Skull: Normal. Negative for fracture or focal lesion. Sinuses/Orbits: No acute finding. Other: None. IMPRESSION: 1. No evidence of acute intracranial abnormality 2. Atrophy and mild chronic small-vessel white matter ischemic changes. Electronically Signed   By: Margarette Canada M.D.   On: 10/31/2017 13:30   Ct Angio Chest Pe W And/or Wo Contrast  Result Date: 10/29/2017 CLINICAL DATA:  Chest pain.  Malignancy history. EXAM: CT ANGIOGRAPHY CHEST WITH CONTRAST TECHNIQUE: Multidetector CT imaging of the chest was performed using the standard protocol during bolus administration of intravenous contrast. Multiplanar CT image reconstructions and MIPs were obtained to evaluate the vascular anatomy. CONTRAST:  51mL ISOVUE-370 IOPAMIDOL (ISOVUE-370) INJECTION 76% COMPARISON:  PET-CT 06/03/2017, chest CT 02/25/2017. Radiographs earlier this day. FINDINGS: Cardiovascular: There are no filling defects within the pulmonary arteries to suggest pulmonary embolus. Aortic atherosclerosis without aneurysm. No gross dissection allowing for phase of contrast. Mild right heart dilatation with contrast refluxing into the IVC. Small to moderate pericardial effusion measuring up to 14 mm in depth. Accessed right internal jugular port with tip in the distal SVC. Mediastinum/Nodes: Multiple small mediastinal nodes not enlarged by size criteria. Soft tissue density in the  right hilum in the region of bronchus intermedius and origin of the right mainstem bronchus. No left hilar adenopathy. No axillary adenopathy. Esophagus is decompressed, partially obscured by adjacent pleural fluid. Right axillary surgical clips, no axillary adenopathy. Lungs/Pleura: Moderate right and small to moderate left pleural effusion. Adjacent compressive atelectasis in both lower lobes. Patchy ground-glass opacities with mild septal thickening in both lower lobes may represent pulmonary edema. Near complete occlusion of the origin of the right middle lobe bronchus, unclear if this is secondary to bronchial filling or external compression, associated volume loss and patchy ground-glass opacities throughout the right middle lobe. There is improved aeration compared to 02/25/2017 CT but worsened since 06/03/2017 PET. Irregular left apical opacity has a linear morphology on axial imaging but spiculated nodular appearance on coronal reformats and measures approximately 1.5 x 1.8 x 1.3 cm, image 20 series 5. Pleural tail abuts the medial pleural. Small nodule in the anterior right upper lobe measuring approximately 8 x 3 mm is unchanged from prior exams, image 39 series 6. Mild upper lobe predominant emphysema. Upper Abdomen: No acute findings of the visualized upper abdomen. Musculoskeletal: No lytic or blastic osseous lesions. Scoliosis and degenerative change in the spine. Review of the MIP images confirms the above findings. IMPRESSION: 1. No pulmonary embolus. 2. Moderate right and small to moderate left pleural effusion. Patchy opacities with septal thickening in the lower lobes suspicious for pulmonary edema, in conjunction with contrast refluxing into the IVC. Findings suggesting mild congestive heart failure. 3. Small to moderate pericardial effusion. 4. Irregular left apical nodular opacity measuring up to 1.8 cm, new from prior PET. Given history of malignancy, this is nonspecific for  infectious/inflammatory process versus metastasis. Consider repeat PET characterization versus follow-up CT in 3 months. 5. Recurrent partial collapse of the right middle lobe with near complete occlusion at the origin of the  right middle lobe bronchus, unclear if this is due to intraluminal filling or external compression. Collapse was not present on most recent PET imaging but was seen on chest CT 8 months prior, suggesting this is intermittent. Aortic Atherosclerosis (ICD10-I70.0) and Emphysema (ICD10-J43.9). Electronically Signed   By: Jeb Levering M.D.   On: 10/29/2017 23:22    EKG:   Orders placed or performed during the hospital encounter of 10/29/17  . ED EKG within 10 minutes  . ED EKG within 10 minutes  . EKG 12-Lead  . EKG 12-Lead  . EKG 12-Lead  . EKG 12-Lead    ASSESSMENT AND PLAN:   72 year old female patient with underlying metastatic right breast cancer, myelodysplastic syndrome with marrow failure gets recurrent blood transfusions and platelet transfusion every week at cancer center comes in with chest pain and found to have elevated troponins and admitted to telemetry.  #1 acute chest pain secondary to non-ST elevation MI, patient is not candidate for aggressive intervention secondary to her underlying myelodysplastic syndrome.  Continue conservative treatment with beta-blockers, ACE inhibitors, nitrates. 2.  Acute on chronic systolic heart failure with EF 25%, patient elevated BNP.  Continue Lasix, patient is on low-dose Lasix IV.  Patient is on Toprol XL 50 mg daily to help with tachycardia.  Likely discharge her on low-dose ACE inhibitors/ARB as far as the blood pressure improves and stable on Toprol, Lasix.  Patient told me she is feeling fuzzy in the vision is some but blurry so I got a CT of the head, her neurologic exam is within normal limits, CT head did not show acute changes.  Patient vitals are checked at that time and vitals are stable.  Did not have bradycardia  or hypotension.  3.  Idiopathic bone marrow my failure, stage IV breast cancer: Prognosis really really poor in light of non-ST elevation MI, CODE STATUS changed to DNR. Patient will get a 1 unit of blood transfusion, because of low white count patient is getting Xanax injection. 4.  Severe malnutrition in the context of chronic illness.  Seen by dietitian, patient continue Ensure supplements. 5.  Chronic infection prophylaxis with acyclovir, Levaquin. 6.  History of reactive airway disease, wheezing so patient is on Breo Ellipta, albuterol Anorexia: Patient is on Megace. Prognosis extremely poor, high risk for cardiac arrest.   All the records are reviewed and case discussed with Care Management/Social Workerr. Management plans discussed with the patient, family and they are in agreement.  CODE STATUS: DNR  TOTAL TIME TAKING CARE OF THIS PATIENT: 40 minutes.  More than 50% time spent in counseling, coordination of care, discussed with multiple family members especially the son, daughter at bedside.  Spoke with Dr. Rogue Bussing from oncology.  POSSIBLE D/C IN 1-2 DAYS, DEPENDING ON CLINICAL CONDITION.   Epifanio Lesches M.D on 10/31/2017 at 2:38 PM  Between 7am to 6pm - Pager - [phone]  After 6pm go to [email or web address] - password [email or web address] [facility]  [facility]  Office  [phone]  CC: Primary care physician; [name], MD   Note: This dictation was prepared with Dragon dictation along with smaller phrase technology. Any transcriptional errors that result from this process are unintentional.

## 2017-10-31 NOTE — Progress Notes (Signed)
Progress Note  Patient Name: Shelly Arias Date of Encounter: 10/31/2017  Primary Cardiologist: New (Consult by Dr. Saunders Revel)  Subjective   She continues to complain of shortness of breath and orthopnea.  No chest discomfort.  Inpatient Medications    Scheduled Meds: . acyclovir  400 mg Oral BID  . feeding supplement (ENSURE ENLIVE)  237 mL Oral TID WC & HS  . fluconazole  100 mg Oral Daily  . fluticasone furoate-vilanterol  1 puff Inhalation Daily  . furosemide  20 mg Intravenous Q12H  . ipratropium-albuterol  3 mL Nebulization Q4H  . letrozole  2.5 mg Oral Daily  . levofloxacin  250 mg Oral Daily  . megestrol  400 mg Oral BID  . metoprolol succinate  50 mg Oral Daily  . nicotine  21 mg Transdermal Daily  . pantoprazole  40 mg Oral BID  . pravastatin  40 mg Oral Daily  . sucralfate  1 g Oral TID WC & HS   Continuous Infusions:  PRN Meds: acetaminophen, ALPRAZolam, alum & mag hydroxide-simeth, bisacodyl, diphenhydrAMINE, famotidine, guaiFENesin, morphine injection, nitroGLYCERIN, ondansetron (ZOFRAN) IV, promethazine, senna-docusate   Vital Signs    Vitals:   10/30/17 1641 10/30/17 1919 10/31/17 0347 10/31/17 0708  BP: 106/61 111/62 129/77 130/78  Pulse: (!) 110 (!) 112 (!) 124 (!) 109  Resp:  18 (!) 26   Temp: (!) 97.5 F (36.4 C) 97.7 F (36.5 C) 97.9 F (36.6 C) 97.7 F (36.5 C)  TempSrc: Oral Oral Oral Oral  SpO2: 99% 100% 100% 99%  Weight:      Height:        Intake/Output Summary (Last 24 hours) at 10/31/2017 1015 Last data filed at 10/31/2017 0600 Gross per 24 hour  Intake -  Output 600 ml  Net -600 ml   Filed Weights   10/29/17 2059 10/30/17 1347  Weight: 58.1 kg 60.4 kg    Telemetry    Sinus tachycardia- Personally Reviewed  ECG    Not done today- Personally Reviewed  Physical Exam   GEN: No acute distress.  Frail looking patient Neck:  Mild JVD Cardiac: RRR, no murmurs, rubs, or gallops.  Mild bilateral leg edema Respiratory:   Diminished breath sounds at the base consistent with small pleural effusions. GI: Soft, nontender, non-distended  MS: No edema; No deformity. Neuro:  Nonfocal  Psych: Normal affect   Labs    Chemistry Recent Labs  Lab 10/29/17 2106  NA 135  K 4.1  CL 100  CO2 26  GLUCOSE 191*  BUN 27*  CREATININE 1.11*  CALCIUM 8.9  PROT 5.4*  ALBUMIN 2.8*  AST 104*  ALT 100*  ALKPHOS 96  BILITOT 0.9  GFRNONAA 49*  GFRAA 57*  ANIONGAP 9     Hematology Recent Labs  Lab 10/28/17 1346 10/29/17 2106 10/31/17 0856  WBC 1.6* 1.8* 1.4*  RBC 2.75* 2.78* 2.59*  HGB 8.1* 8.2* 7.6*  HCT 23.7* 23.9* 22.0*  MCV 86.3 86.1 84.9  MCH 29.5 29.5 29.5  MCHC 34.2 34.3 34.7  RDW 13.8 13.9 13.7  PLT 13* 70* 34*    Cardiac Enzymes Recent Labs  Lab 10/29/17 2106 10/30/17 0330 10/30/17 0753  TROPONINI 0.73*  0.77* 3.26* 5.48*   No results for input(s): TROPIPOC in the last 168 hours.   BNP Recent Labs  Lab 10/30/17 0753  BNP 2,675.0*     DDimer No results for input(s): DDIMER in the last 168 hours.   Radiology    Dg Chest  2 View  Result Date: 10/29/2017 CLINICAL DATA:  Chest pain EXAM: CHEST - 2 VIEW COMPARISON:  07/21/2017, PET-CT 06/03/2017 FINDINGS: Right-sided central venous port tip over the proximal right atrium. Small bilateral pleural effusions and bibasilar airspace disease. Stable cardiomediastinal silhouette with aortic atherosclerosis. No pneumothorax. IMPRESSION: Small bilateral pleural effusions with bibasilar and right middle lobe airspace disease suspicious for pneumonia. Electronically Signed   By: Donavan Foil M.D.   On: 10/29/2017 21:23   Ct Angio Chest Pe W And/or Wo Contrast  Result Date: 10/29/2017 CLINICAL DATA:  Chest pain.  Malignancy history. EXAM: CT ANGIOGRAPHY CHEST WITH CONTRAST TECHNIQUE: Multidetector CT imaging of the chest was performed using the standard protocol during bolus administration of intravenous contrast. Multiplanar CT image  reconstructions and MIPs were obtained to evaluate the vascular anatomy. CONTRAST:  17mL ISOVUE-370 IOPAMIDOL (ISOVUE-370) INJECTION 76% COMPARISON:  PET-CT 06/03/2017, chest CT 02/25/2017. Radiographs earlier this day. FINDINGS: Cardiovascular: There are no filling defects within the pulmonary arteries to suggest pulmonary embolus. Aortic atherosclerosis without aneurysm. No gross dissection allowing for phase of contrast. Mild right heart dilatation with contrast refluxing into the IVC. Small to moderate pericardial effusion measuring up to 14 mm in depth. Accessed right internal jugular port with tip in the distal SVC. Mediastinum/Nodes: Multiple small mediastinal nodes not enlarged by size criteria. Soft tissue density in the right hilum in the region of bronchus intermedius and origin of the right mainstem bronchus. No left hilar adenopathy. No axillary adenopathy. Esophagus is decompressed, partially obscured by adjacent pleural fluid. Right axillary surgical clips, no axillary adenopathy. Lungs/Pleura: Moderate right and small to moderate left pleural effusion. Adjacent compressive atelectasis in both lower lobes. Patchy ground-glass opacities with mild septal thickening in both lower lobes may represent pulmonary edema. Near complete occlusion of the origin of the right middle lobe bronchus, unclear if this is secondary to bronchial filling or external compression, associated volume loss and patchy ground-glass opacities throughout the right middle lobe. There is improved aeration compared to 02/25/2017 CT but worsened since 06/03/2017 PET. Irregular left apical opacity has a linear morphology on axial imaging but spiculated nodular appearance on coronal reformats and measures approximately 1.5 x 1.8 x 1.3 cm, image 20 series 5. Pleural tail abuts the medial pleural. Small nodule in the anterior right upper lobe measuring approximately 8 x 3 mm is unchanged from prior exams, image 39 series 6. Mild upper  lobe predominant emphysema. Upper Abdomen: No acute findings of the visualized upper abdomen. Musculoskeletal: No lytic or blastic osseous lesions. Scoliosis and degenerative change in the spine. Review of the MIP images confirms the above findings. IMPRESSION: 1. No pulmonary embolus. 2. Moderate right and small to moderate left pleural effusion. Patchy opacities with septal thickening in the lower lobes suspicious for pulmonary edema, in conjunction with contrast refluxing into the IVC. Findings suggesting mild congestive heart failure. 3. Small to moderate pericardial effusion. 4. Irregular left apical nodular opacity measuring up to 1.8 cm, new from prior PET. Given history of malignancy, this is nonspecific for infectious/inflammatory process versus metastasis. Consider repeat PET characterization versus follow-up CT in 3 months. 5. Recurrent partial collapse of the right middle lobe with near complete occlusion at the origin of the right middle lobe bronchus, unclear if this is due to intraluminal filling or external compression. Collapse was not present on most recent PET imaging but was seen on chest CT 8 months prior, suggesting this is intermittent. Aortic Atherosclerosis (ICD10-I70.0) and Emphysema (ICD10-J43.9). Electronically Signed  By: Jeb Levering M.D.   On: 10/29/2017 23:22    Cardiac Studies   I personally reviewed her echocardiogram which was done on August 9:   - Left ventricle: The cavity size was normal. Wall thickness was   normal. Systolic function was severely reduced. The estimated   ejection fraction was in the range of 20% to 25%. Diffuse   hypokinesis. The study is not technically sufficient to allow   evaluation of LV diastolic function. - Mitral valve: Calcified annulus. There was mild regurgitation. - Pulmonary arteries: Systolic pressure could not be accurately   estimated. - Inferior vena cava: The vessel was moderately dilated. The   respirophasic diameter  changes were blunted (< 50%), consistent   with elevated central venous pressure. - Pericardium, extracardiac: A small pericardial effusion was   identified. There was no evidence of hemodynamic compromise.  Patient Profile     72 y.o. 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  She presented with shortness of breath and was found to have non-ST elevation myocardial infarction.  Assessment & Plan    1.  Non-ST elevation myocardial infarction: Unfortunately, she is not a candidate for antiplatelet or antithrombotic medications and also not a candidate for cardiac catheterization or invasive procedures given underlying bone marrow failure.  Recommend continuing medical therapy.  2.  Acute on chronic systolic heart failure: She continues to be volume overloaded.  I agree with IV furosemide but we might be limited by worsening renal function.  Continue IV for another day and with possible switching to oral tomorrow. I increase the dose of Toprol to 50 mg once daily. If blood pressure allows, we can add a small dose ACE inhibitor, ARB or Entresto.  3.  Idiopathic bone marrow failure with history of stage IV breast cancer: I discussed the case with Dr. Rogue Bussing.  Overall prognosis seems to be very poor especially with her new cardiac findings.       For questions or updates, please contact [place] Please consult [email or web address] for contact info under Cardiology/STEMI.      Signed, Kathlyn Sacramento, MD  10/31/2017, 10:15 AM

## 2017-10-31 NOTE — Progress Notes (Signed)
Initial Nutrition Assessment  DOCUMENTATION CODES:   Non-severe (moderate) malnutrition in context of chronic illness  INTERVENTION:   Boost Plus BID- Each supplement provides 360kcal and 14g protein.   MVI daily  Vitamin C 25m po BID  NUTRITION DIAGNOSIS:   Moderate Malnutrition related to cancer and cancer related treatments as evidenced by moderate fat depletion, moderate muscle depletion.  GOAL:   Patient will meet greater than or equal to 90% of their needs  MONITOR:   PO intake, Supplement acceptance, Labs, Weight trends, Skin, I & O's  REASON FOR ASSESSMENT:   Consult Poor PO  ASSESSMENT:   72y.o. female on 10/29/2017 from home with chest pain. She has a past medical history of metastatic right breast cancer ER/PR positive (s/p surgery and chemoradiation), pancytopenia, MDS with marrow failure, osteoporosis, hypertension, hyperlipidemia, GERD, and COPD.    Met with pt in room today. Pt familiar to nutrition department from recent previous admit. Pt with poor appetite and oral intake at baseline; reports decreased oral intake for a year. Pt eating 100% of meals at last admit. Pt reports drinking 1-2 Boost Plus per day and her husband has been bringing in Boost to the hospital. Pt does not like Ensure. Per chart, pt is weight stable. RD will add vitamins to help pt meet her estimated needs. [facility] Boost coupons today. Pt also initiated on megace.   Medications reviewed and include: Lasix, megace, nicotine, protonix, carafate  Labs reviewed: BNP 2675(H) Wbc- 1.4(L), Hgb 7.6(L), Hct 22.0(L)  NUTRITION - FOCUSED PHYSICAL EXAM:    Most Recent Value  Orbital Region  Mild depletion  Upper Arm Region  Moderate depletion  Thoracic and Lumbar Region  Moderate depletion  Buccal Region  Mild depletion  Temple Region  Moderate depletion  Clavicle Bone Region  Moderate depletion  Clavicle and Acromion Bone Region   Moderate depletion  Scapular Bone Region  Moderate depletion  Dorsal Hand  Moderate depletion  Patellar Region  Moderate depletion  Anterior Thigh Region  Moderate depletion  Posterior Calf Region  Moderate depletion  Edema (RD Assessment)  Moderate  Hair  Reviewed  Eyes  Reviewed  Mouth  Reviewed  Skin  Reviewed  Nails  Reviewed     Diet Order:   Diet Order            Diet regular Room service appropriate? Yes; Fluid consistency: Thin  Diet effective now             EDUCATION NEEDS:   Education needs have been addressed  Skin:  Skin Assessment: Reviewed RN Assessment(ecchymosis )  Last BM:  8/20  Height:   Ht Readings from Last 1 Encounters:  10/30/17 5' 1"  (1.549 m)    Weight:   Wt Readings from Last 1 Encounters:  10/30/17 60.4 kg    Ideal Body Weight:  47.7 kg  BMI:  Body mass index is 25.15 kg/m.  Estimated Nutritional Needs:   Kcal:  1400-1600kcal/day   Protein:  67-80g/day   Fluid:  >1.4L/day   CKoleen DistanceMS, RD, LDN Pager #- [phone]#- [phone] Hours Pager: [phone]

## 2017-10-31 NOTE — Progress Notes (Signed)
Pt  On o2 2lpm tolerating welll .

## 2017-10-31 NOTE — Progress Notes (Signed)
Verbal orders to change the carafate tablet to liquid carafate. Patient stated the pill had a "horrible taste with the applesauce" and she cant not take it whole bc of it's size, so Dr.Konidena gave verbal orders to change it to liquid form. Will continue to monitor patient.

## 2017-11-01 ENCOUNTER — Inpatient Hospital Stay: Payer: Medicare Other

## 2017-11-01 ENCOUNTER — Ambulatory Visit: Payer: Medicare Other

## 2017-11-01 ENCOUNTER — Other Ambulatory Visit: Payer: Self-pay

## 2017-11-01 ENCOUNTER — Ambulatory Visit: Payer: Medicare Other | Admitting: Family Medicine

## 2017-11-01 DIAGNOSIS — D469 Myelodysplastic syndrome, unspecified: Secondary | ICD-10-CM

## 2017-11-01 LAB — CBC WITH DIFFERENTIAL/PLATELET
BAND NEUTROPHILS: 8 %
BASOS PCT: 0 %
BLASTS: 0 %
Basophils Absolute: 0 10*3/uL (ref 0–0.1)
EOS ABS: 0 10*3/uL (ref 0–0.7)
Eosinophils Relative: 0 %
HEMATOCRIT: 27.1 % — AB (ref 35.0–47.0)
HEMOGLOBIN: 9.4 g/dL — AB (ref 12.0–16.0)
LYMPHS PCT: 60 %
Lymphs Abs: 1.1 10*3/uL (ref 1.0–3.6)
MCH: 29.6 pg (ref 26.0–34.0)
MCHC: 34.6 g/dL (ref 32.0–36.0)
MCV: 85.5 fL (ref 80.0–100.0)
MONO ABS: 0 10*3/uL — AB (ref 0.2–0.9)
Metamyelocytes Relative: 0 %
Monocytes Relative: 2 %
Myelocytes: 0 %
Neutro Abs: 0.7 10*3/uL — ABNORMAL LOW (ref 1.4–6.5)
Neutrophils Relative %: 30 %
OTHER: 0 %
PROMYELOCYTES RELATIVE: 0 %
Platelets: 21 10*3/uL — CL (ref 150–440)
RBC: 3.17 MIL/uL — ABNORMAL LOW (ref 3.80–5.20)
RDW: 13.5 % (ref 11.5–14.5)
WBC: 1.8 10*3/uL — ABNORMAL LOW (ref 3.6–11.0)
nRBC: 0 /100 WBC

## 2017-11-01 LAB — BASIC METABOLIC PANEL
ANION GAP: 8 (ref 5–15)
BUN: 22 mg/dL (ref 8–23)
CHLORIDE: 94 mmol/L — AB (ref 98–111)
CO2: 30 mmol/L (ref 22–32)
CREATININE: 0.78 mg/dL (ref 0.44–1.00)
Calcium: 8.6 mg/dL — ABNORMAL LOW (ref 8.9–10.3)
GFR calc non Af Amer: >60 mL/min (ref >60)
Glucose, Bld: 158 mg/dL — ABNORMAL HIGH (ref 70–99)
POTASSIUM: 3.1 mmol/L — AB (ref 3.5–5.1)
SODIUM: 132 mmol/L — AB (ref 135–145)

## 2017-11-01 MED ORDER — MORPHINE SULFATE (PF) 2 MG/ML IV SOLN
1.0000 mg | INTRAVENOUS | Status: DC | PRN
  Administered 2017-11-01 – 2017-11-05 (×6): 2 mg via INTRAVENOUS
  Filled 2017-11-01 (×7): qty 1

## 2017-11-01 MED ORDER — ISOSORBIDE MONONITRATE ER 30 MG PO TB24: 15.0000 mg | ORAL_TABLET | Freq: Every day | ORAL | Status: DC

## 2017-11-01 MED ORDER — BIOTENE DRY MOUTH MT LIQD: 15.0000 mL | OROMUCOSAL | Status: DC | PRN

## 2017-11-01 MED ORDER — POTASSIUM CHLORIDE CRYS ER 20 MEQ PO TBCR
30.0000 meq | EXTENDED_RELEASE_TABLET | Freq: Once | ORAL | Status: AC
  Administered 2017-11-01: 30 meq via ORAL
  Filled 2017-11-01: qty 1

## 2017-11-01 MED ORDER — IPRATROPIUM-ALBUTEROL 0.5-2.5 (3) MG/3ML IN SOLN
3.0000 mL | Freq: Four times a day (QID) | RESPIRATORY_TRACT | Status: DC
  Administered 2017-11-01 – 2017-11-05 (×14): 3 mL via RESPIRATORY_TRACT
  Filled 2017-11-01 (×14): qty 3

## 2017-11-01 MED ORDER — NITROGLYCERIN 2 % TD OINT: 0.5000 [in_us] | TOPICAL_OINTMENT | Freq: Four times a day (QID) | TRANSDERMAL | Status: DC

## 2017-11-01 MED ORDER — ISOSORBIDE MONONITRATE ER 30 MG PO TB24
30.0000 mg | ORAL_TABLET | Freq: Every day | ORAL | Status: DC
  Administered 2017-11-01 – 2017-11-06 (×6): 30 mg via ORAL
  Filled 2017-11-01 (×6): qty 1

## 2017-11-01 NOTE — Plan of Care (Signed)

## 2017-11-01 NOTE — Plan of Care (Signed)

## 2017-11-01 NOTE — Progress Notes (Addendum)
Progress Note  Patient Name: Shelly Arias Date of Encounter: 11/01/2017  Primary Cardiologist: Nelva Bush, MD  Subjective   Had a pretty good day yesterday.  Received 1 unit or prbc's last night.  Feels weaker this AM.  Dyspnea about the same - labored speech.  Had mild chest discomfort after awakening this AM.  Improved within a few mins after receiving morphine.  Inpatient Medications    Scheduled Meds: . acyclovir  400 mg Oral BID  . fluconazole  100 mg Oral Daily  . fluticasone furoate-vilanterol  1 puff Inhalation Daily  . furosemide  20 mg Intravenous Q12H  . ipratropium-albuterol  3 mL Nebulization Q4H  . letrozole  2.5 mg Oral Daily  . levofloxacin  250 mg Oral Daily  . megestrol  400 mg Oral BID  . metoprolol succinate  50 mg Oral Daily  . multivitamin with minerals  1 tablet Oral Daily  . nicotine  21 mg Transdermal Daily  . nitroGLYCERIN  0.5 inch Topical Q6H  . pantoprazole  40 mg Oral BID  . pravastatin  40 mg Oral Daily  . sucralfate  1 g Oral TID WC & HS  . Tbo-filgastrim (GRANIX) SQ  480 mcg Subcutaneous Daily  . vitamin C  250 mg Oral BID   Continuous Infusions:  PRN Meds: acetaminophen, ALPRAZolam, alum & mag hydroxide-simeth, bisacodyl, diphenhydrAMINE, famotidine, guaiFENesin, morphine injection, nitroGLYCERIN, ondansetron (ZOFRAN) IV, promethazine, senna-docusate   Vital Signs    Vitals:   11/01/17 0359 11/01/17 0406 11/01/17 0730 11/01/17 0743  BP: 118/71  136/89   Pulse: (!) 115  (!) 117   Resp: 17  20   Temp: (!) 97.5 F (36.4 C)  97.6 F (36.4 C)   TempSrc: Oral  Oral   SpO2: 100% 95% 100% 98%  Weight: 59.6 kg     Height:        Intake/Output Summary (Last 24 hours) at 11/01/2017 1048 Last data filed at 11/01/2017 0949 Gross per 24 hour  Intake 700 ml  Output 950 ml  Net -250 ml   Filed Weights   10/29/17 2059 10/30/17 1347 11/01/17 0359  Weight: 58.1 kg 60.4 kg 59.6 kg    Physical Exam   GEN: Somewhat frail.   Labored speech. HEENT: Grossly normal.  Neck: Supple, no JVD, carotid bruits, or masses. Cardiac: RRR, tachycardic, no murmurs, rubs, or gallops. No clubbing, cyanosis, trace R>L lower ext edema.  Radials/DP/PT 1+ and equal bilaterally.  Respiratory:  Respirations regular.  Unlabored @ rest but becomes labored when talking.  Diminished breath sounds bilat with scattered rhonchi throughout. GI: Soft, nontender, nondistended, BS + x 4. MS: no deformity or atrophy. Skin: warm and dry, no rash. Neuro:  Strength and sensation are intact. Psych: AAOx3.  Normal affect.  Labs    Chemistry Recent Labs  Lab 10/29/17 2106  NA 135  K 4.1  CL 100  CO2 26  GLUCOSE 191*  BUN 27*  CREATININE 1.11*  CALCIUM 8.9  PROT 5.4*  ALBUMIN 2.8*  AST 104*  ALT 100*  ALKPHOS 96  BILITOT 0.9  GFRNONAA 49*  GFRAA 57*  ANIONGAP 9     Hematology Recent Labs  Lab 10/28/17 1346 10/29/17 2106 10/31/17 0856  WBC 1.6* 1.8* 1.4*  RBC 2.75* 2.78* 2.59*  HGB 8.1* 8.2* 7.6*  HCT 23.7* 23.9* 22.0*  MCV 86.3 86.1 84.9  MCH 29.5 29.5 29.5  MCHC 34.2 34.3 34.7  RDW 13.8 13.9 13.7  PLT 13* 70* 34*  Cardiac Enzymes Recent Labs  Lab 10/29/17 2106 10/30/17 0330 10/30/17 0753  TROPONINI 0.73*  0.77* 3.26* 5.48*    BNP Recent Labs  Lab 10/30/17 0753  BNP 2,675.0*      Radiology    Ct Head Wo Contrast  Result Date: 10/31/2017 CLINICAL DATA:  72 year old female with severe headache today. History of breast cancer. EXAM: CT HEAD WITHOUT CONTRAST TECHNIQUE: Contiguous axial images were obtained from the base of the skull through the vertex without intravenous contrast. COMPARISON:  07/03/2016 brain MR FINDINGS: Brain: No evidence of acute infarction, hemorrhage, hydrocephalus, extra-axial collection or mass lesion/mass effect. Atrophy and mild chronic small-vessel white matter ischemic changes again noted. Vascular: No hyperdense vessel or unexpected calcification. Skull: Normal. Negative for  fracture or focal lesion. Sinuses/Orbits: No acute finding. Other: None. IMPRESSION: 1. No evidence of acute intracranial abnormality 2. Atrophy and mild chronic small-vessel white matter ischemic changes. Electronically Signed   By: Margarette Canada M.D.   On: 10/31/2017 13:30    Telemetry    Sinus tachycardia - 100 - 120's - Personally Reviewed  Cardiac Studies   2D Echocardiogram 8.9.2019  Left ventricle: The cavity size was normal. Wall thickness was   normal. Systolic function was severely reduced. The estimated   ejection fraction was in the range of 20% to 25%. Diffuse   hypokinesis. The study is not technically sufficient to allow   evaluation of LV diastolic function. - Mitral valve: Calcified annulus. There was mild regurgitation. - Pulmonary arteries: Systolic pressure could not be accurately   estimated. - Inferior vena cava: The vessel was moderately dilated. The   respirophasic diameter changes were blunted (< 50%), consistent   with elevated central venous pressure. - Pericardium, extracardiac: A small pericardial effusion was   identified. There was no evidence of hemodynamic compromise. _____________   Patient Profile     72 y.o. 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  She presented with shortness of breath and was found to have non-ST elevation myocardial infarction.   Assessment & Plan    1.  NSTEMI: Admitted 8/20 with acute onset of chest pain.  Trop peaked @ 5.48 on 8/21 (not eval since).  In the setting of idiopathic bone marrow failure and thrombocytopenia, she is not a candidate for antiplatelet therapy and as such will be managed conservatively.  She did have some chest discomfort this AM, which improved with morphine.  She remains in sinus tachycardia.    blocker initiated yesterday.  Hemodynamically stable.  I will add long-acting nitrate today (d/c paste). Cont statin.  2.  Acute on chronic systolic CHF:  EF 47-42% by echo 8/9.  Minus 150 yesterday.  Minus 850 since admission.  Wt down 0.8 kg since 8/21.  Dry wt appears to be ~ 57kg.  Currently 59.6kg.  Still with mild lower ext edema on exam.  F/u bmet this AM (added to blood in lab).  If stable, cont IV lasix - currently 20 BID.  Cont  blocker.  If renal fxn stable, can consider adding low dose arb or entresto.  Also, pending renal fxn, would likely benefit from spironolactone and digoxin - can consider over next few days.  3.  Idiopathic bone marrow failure w/ h/o stage IV breast cancer:  Followed by oncology.  S/p 1u prbc's last night.  Poor prognosis.  Signed,  Murray Hodgkins, NP  11/01/2017, 10:48 AM    For questions or updates, please contact   Please consult [email or web address] for contact info under Cardiology/STEMI.

## 2017-11-01 NOTE — Progress Notes (Signed)
[facility] NAME: Chelcee Korpi    MR#:  [id number]  DATE OF BIRTH:  [birth year]  SUBJECTIVE: Admitted because of chest pain, non-ST elevation MI, acute on chronic systolic heart failure.  Patient has history of MDS, bone marrow failure, gets recurrent blood transfusion, platelet transfusion, received 1 unit of packed RBC transfusion yesterday, after that patient had trouble breathing, wheezing, could not rest last night.  Today she is very weak, has mild chest pain, shortness of breath.  CHIEF COMPLAINT:   Chief Complaint  Patient presents with  . Chest Pain  Tachycardic.  Continues to have poor p.o. intake, swelling of the legs, face.  REVIEW OF SYSTEMS:   ROS CONSTITUTIONAL: No fever, fatigue, appears pale, appears chronically ill.  Cachectic.  EYES: No blurred or double vision.  EARS, NOSE, AND THROAT: No tinnitus or ear pain.  RESPIRATORY:  shortness of breath, mild chest pain CARDIOVASCULAR: Has some chest pain on and off.   GASTROINTESTINAL: No nausea, vomiting, diarrhea or abdominal pain.  GENITOURINARY: No dysuria, hematuria.  ENDOCRINE: No polyuria, nocturia,  HEMATOLOGY: No anemia, easy bruising or bleeding SKIN: No rash or lesion. MUSCULOSKELETAL: Leg edema.  Marland Kitchen  NEUROLOGIC: No tingling, numbness, weakness.  PSYCHIATRY: No anxiety or depression.   DRUG ALLERGIES:   Allergies  Allergen Reactions  . Codeine Anaphylaxis  . Fish-Derived Products Anaphylaxis  . Morphine Sulfate Other (See Comments)    REACTION: Hypotension  . Adhesive [Tape]     PAPER TAPE OK TO USE  . Oxycodone     Nausea and vomiting    VITALS:  Blood pressure 136/89, pulse (!) 117, temperature 97.6 F (36.4 C), temperature source Oral, resp. rate 20, height 5\' 1"  (1.549 m), weight 59.6 kg, SpO2 98 %.  PHYSICAL EXAMINATION:  GENERAL:  72 y.o.-year-old patient lying in the bed with no acute distress.  EYES: Pupils equal, round, reactive  to light and accommodation. No scleral icterus. Extraocular muscles intact.  HEENT: Head atraumatic, normocephalic. Oropharynx and nasopharynx clear.  NECK:  Supple, no jugular venous distention. No thyroid enlargement, no tenderness.  LUNGS: Normal breath sounds bilaterally, no wheezing, rales,rhonchi or crepitation. No use of accessory muscles of respiration.  CARDIOVASCULAR: S1, S2 normal. No murmurs, rubs, or gallops.  ABDOMEN: Soft, nontender, nondistended. Bowel sounds present. No organomegaly or mass.  EXTREMITIES: Bilateral leg edema 1+. NEUROLOGIC: Cranial nerves II through XII are intact. Muscle strength 5/5 in all extremities. Sensation intact. Gait not checked.  PSYCHIATRIC: The patient is alert and oriented x 3.  SKIN: No obvious rash, lesion, or ulcer.    LABORATORY PANEL:   CBC Recent Labs  Lab 11/01/17 1001  WBC 1.8*  HGB 9.4*  HCT 27.1*  PLT 21*   ------------------------------------------------------------------------------------------------------------------  Chemistries  Recent Labs  Lab 10/29/17 2106  NA 135  K 4.1  CL 100  CO2 26  GLUCOSE 191*  BUN 27*  CREATININE 1.11*  CALCIUM 8.9  MG 1.7  AST 104*  ALT 100*  ALKPHOS 96  BILITOT 0.9   ------------------------------------------------------------------------------------------------------------------  Cardiac Enzymes Recent Labs  Lab 10/30/17 0753  TROPONINI 5.48*   ------------------------------------------------------------------------------------------------------------------  RADIOLOGY:  Ct Head Wo Contrast  Result Date: 10/31/2017 CLINICAL DATA:  72 year old female with severe headache today. History of breast cancer. EXAM: CT HEAD WITHOUT CONTRAST TECHNIQUE: Contiguous axial images were obtained from the base of the skull through the vertex without intravenous contrast. COMPARISON:  07/03/2016 brain MR FINDINGS: Brain: No evidence of  acute infarction, hemorrhage, hydrocephalus,  extra-axial collection or mass lesion/mass effect. Atrophy and mild chronic small-vessel white matter ischemic changes again noted. Vascular: No hyperdense vessel or unexpected calcification. Skull: Normal. Negative for fracture or focal lesion. Sinuses/Orbits: No acute finding. Other: None. IMPRESSION: 1. No evidence of acute intracranial abnormality 2. Atrophy and mild chronic small-vessel white matter ischemic changes. Electronically Signed   By: Margarette Canada M.D.   On: 10/31/2017 13:30    EKG:   Orders placed or performed during the hospital encounter of 10/29/17  . ED EKG within 10 minutes  . ED EKG within 10 minutes  . EKG 12-Lead  . EKG 12-Lead  . EKG 12-Lead  . EKG 12-Lead  . EKG 12-Lead    ASSESSMENT AND PLAN:   72 year old female patient with underlying metastatic right breast cancer, myelodysplastic syndrome with marrow failure gets recurrent blood transfusions and platelet transfusion every week at cancer center comes in with chest pain and found to have elevated troponins and admitted to telemetry.  #1. acute chest pain secondary to non-ST elevation MI, patient is not candidate for aggressive intervention secondary to her underlying myelodysplastic syndrome.  Continue conservative treatment with beta-blockers, ACE inhibitors, nitrates. Because of persistent tachycardia beta-blockers are started yesterday.  Start long-acting nitrate today, discontinue Nitropaste.,  Continue statins.  2.  Acute on chronic systolic heart failure with EF 25%, patient elevated BNP.  Continue Lasix, patient is on low-dose Lasix IV.  Follow-up might be today.  Patient is on Toprol XL 50 mg daily to help with tachycardia.  Likely discharge her on low-dose ACE inhibitors/ARB as far as the blood pressure improves and stable on Toprol, Lasix. .  3.  Idiopathic bone marrow my failure, stage IV breast cancer Recurrent anemia, thrombocytopenia, gets irradiated blood, platelets every week at cancer center,  status post 1 unit of packed RBC transfusion yesterday after that shortness of breath got worse and today she is weaker.  And platelets again dropped to 20 today.  Overall prognosis really poor with her acute MI, multiple other medical problems of MDS, pulmonary failure, heart failure, family is leaning towards hospice but did not make the decision yet.  : Prognosis really really poor in light of non-ST elevation MI, CODE STATUS changed to DNR.  4.  Severe malnutrition in the context of chronic illness.  Seen by dietitian, patient continue Ensure supplements. 5.  Chronic infection prophylaxis with acyclovir, Levaquin. 6.  History of reactive airway disease, less wheezing, change of the nebulizers to every 6 hours so she can rest some at night,   anorexia: Patient is on Megace. Prognosis extremely poor, high risk for cardiac arrest. Morphine is changed to help with chest pain, air hunger, shortness of breath.  D discussed with patient's daughter and also husband..  All the records are reviewed and case discussed with Care Management/Social Workerr. Management plans discussed with the patient, family and they are in agreement.  CODE STATUS: DNR  TOTAL TIME TAKING CARE OF THIS PATIENT: 40 minutes.  More than 50% time spent in counseling, coordination of care, discussed with multiple family members especially the son, daughter at bedside.  Spoke with Dr. Rogue Bussing from oncology.  POSSIBLE D/C IN 1-2 DAYS, DEPENDING ON CLINICAL CONDITION.   Epifanio Lesches M.D on 11/01/2017 at 1:13 PM  Between 7am to 6pm - Pager - [phone]  After 6pm go to [email or web address] - password [email or web address] [facility]  [facility]  Office  [phone]  CC: Primary care physician; [name],  MD   Note: This dictation was prepared with Dragon dictation along with smaller phrase technology. Any transcriptional errors that result from this process are unintentional.

## 2017-11-01 NOTE — Care Management Important Message (Signed)
Important Message  Patient Details  Name: Shelly Arias MRN: [id number] Date of Birth: 26-Apr-1945   Medicare Important Message Given:  Yes    Juliann Pulse A Trayquan Kolakowski 11/01/2017, 11:41 AM

## 2017-11-01 NOTE — Care Management (Signed)
Met with patient's husband. He states that "she cannot return to home". He states that she has a living will and recently changed to DNR status.  RNCM present hospice agencies verbally. He is familiar with [facility] house and [facility].  [place] is closer to his home.  He will discuss with patient also.  Palliative is involved.  He has my contact number also for any questions.

## 2017-11-01 NOTE — Progress Notes (Signed)
Shelly Arias   DOB:[birth year]   DU#:[id number]    Subjective: Patient had difficulty sleeping last night because of shortness of breath.  Complains of chest pressure.  Positive for cough.  No hemoptysis.  Overall feels poorly this morning.  Poor appetite.  No nausea no vomiting.  Objective:  Vitals:   11/01/17 1256 11/01/17 1644  BP:  115/72  Pulse:  (!) 108  Resp:  18  Temp:  97.7 F (36.5 C)  SpO2: 98% 100%     Intake/Output Summary (Last 24 hours) at 11/01/2017 1715 Last data filed at 11/01/2017 1700 Gross per 24 hour  Intake 700 ml  Output 1250 ml  Net -550 ml   GENERAL: Thin built moderately poorly nourished female patient alert, no distress and comfortable.  Accompanied by husband.  On O2 2Lit/min EYES: Positive for pallor. OROPHARYNX: no thrush or ulceration. NECK: supple, no masses felt LYMPH:  no palpable lymphadenopathy in the cervical, axillary or inguinal regions LUNGS: decreased breath sounds to auscultation at bases.  HEART/CVS: regular rate & rhythm and no murmurs; No lower extremity edema ABDOMEN: abdomen soft, non-tender and normal bowel sounds Musculoskeletal:no cyanosis of digits and no clubbing  PSYCH: alert & oriented x 3 with fluent speech NEURO: no focal motor/sensory deficits SKIN: Multiple ecchymosis.   Labs:  Lab Results  Component Value Date   WBC 1.8 (L) 11/01/2017   HGB 9.4 (L) 11/01/2017   HCT 27.1 (L) 11/01/2017   MCV 85.5 11/01/2017   PLT 21 (LL) 11/01/2017   NEUTROABS 0.7 (L) 11/01/2017    Lab Results  Component Value Date   NA 132 (L) 11/01/2017   K 3.1 (L) 11/01/2017   CL 94 (L) 11/01/2017   CO2 30 11/01/2017    Studies:  Ct Head Wo Contrast  Result Date: 10/31/2017 CLINICAL DATA:  72 year old female with severe headache today. History of breast cancer. EXAM: CT HEAD WITHOUT CONTRAST TECHNIQUE: Contiguous axial images were obtained from the base of the skull through the vertex without intravenous contrast. COMPARISON:   07/03/2016 brain MR FINDINGS: Brain: No evidence of acute infarction, hemorrhage, hydrocephalus, extra-axial collection or mass lesion/mass effect. Atrophy and mild chronic small-vessel white matter ischemic changes again noted. Vascular: No hyperdense vessel or unexpected calcification. Skull: Normal. Negative for fracture or focal lesion. Sinuses/Orbits: No acute finding. Other: None. IMPRESSION: 1. No evidence of acute intracranial abnormality 2. Atrophy and mild chronic small-vessel white matter ischemic changes. Electronically Signed   By: Margarette Canada M.D.   On: 10/31/2017 13:30    Assessment & Plan:   ASSESSMENT & PLAN:   # 72 year old female patient with a history of undiagnosed bone marrow failure syndrome; metastatic breast cancer-currently admitted the hospital for worsening shortness of breath chest pain/non-STEMI with acute CHF  #"Bone marrow failure syndrome"-status post multiple evaluations-without any clear diagnosis.  Platelets/PRBC transfusion dependent.  Status post PRBC transfusion yesterday-hemoglobin is 8.2 today; platelets 20.  Hold platelet transfusion; will recommend platelet transfusion if platelets less than 15/bleeding.  Patient will need crossmatch platelets.   #Acute CHF/non-STEMI-patient not a candidate for any antiplatelet/antithrombotic therapy.  Recommend morphine for air hunger chest pain anxiety.  Also added nitrate patch.   #Metastatic breast cancer-ER PR positive letrozole-stable.  #Discussed with Dr. Vianne Bulls; overall prognosis extremely poor.  Patient continues to decline over the weekend, comfort care would be reasonable. Palliative care evaluated the patient.    Dr.Yu on call/available over the weekend.  [name], MD 11/01/2017  5:15 PM

## 2017-11-01 NOTE — Progress Notes (Signed)
MD was made aware of patient platelets  count of 21, will continue to monitor

## 2017-11-01 NOTE — Progress Notes (Signed)
Talked to Dr. Vianne Bulls about patient's potassium at 3.1, order to give 30 meq of potassium p.o. RN will continue to monitor.

## 2017-11-02 DIAGNOSIS — D693 Immune thrombocytopenic purpura: Secondary | ICD-10-CM

## 2017-11-02 DIAGNOSIS — I208 Other forms of angina pectoris: Secondary | ICD-10-CM

## 2017-11-02 DIAGNOSIS — Z803 Family history of malignant neoplasm of breast: Secondary | ICD-10-CM

## 2017-11-02 DIAGNOSIS — R778 Other specified abnormalities of plasma proteins: Secondary | ICD-10-CM

## 2017-11-02 DIAGNOSIS — D649 Anemia, unspecified: Secondary | ICD-10-CM

## 2017-11-02 DIAGNOSIS — E538 Deficiency of other specified B group vitamins: Secondary | ICD-10-CM

## 2017-11-02 DIAGNOSIS — R5383 Other fatigue: Secondary | ICD-10-CM

## 2017-11-02 DIAGNOSIS — R7989 Other specified abnormal findings of blood chemistry: Secondary | ICD-10-CM

## 2017-11-02 DIAGNOSIS — I420 Dilated cardiomyopathy: Secondary | ICD-10-CM

## 2017-11-02 DIAGNOSIS — R531 Weakness: Secondary | ICD-10-CM

## 2017-11-02 DIAGNOSIS — D696 Thrombocytopenia, unspecified: Secondary | ICD-10-CM

## 2017-11-02 DIAGNOSIS — Z800 Family history of malignant neoplasm of digestive organs: Secondary | ICD-10-CM

## 2017-11-02 LAB — BASIC METABOLIC PANEL
Anion gap: 9 (ref 5–15)
BUN: 18 mg/dL (ref 8–23)
CO2: 31 mmol/L (ref 22–32)
Calcium: 8.4 mg/dL — ABNORMAL LOW (ref 8.9–10.3)
Chloride: 93 mmol/L — ABNORMAL LOW (ref 98–111)
Creatinine, Ser: 0.78 mg/dL (ref 0.44–1.00)
GFR calc Af Amer: >60 mL/min (ref >60)
GFR calc non Af Amer: >60 mL/min (ref >60)
Glucose, Bld: 152 mg/dL — ABNORMAL HIGH (ref 70–99)
Potassium: 4 mmol/L (ref 3.5–5.1)
Sodium: 133 mmol/L — ABNORMAL LOW (ref 135–145)

## 2017-11-02 LAB — CBC WITH DIFFERENTIAL/PLATELET
Basophils Absolute: 0 10*3/uL (ref 0–0.1)
Basophils Relative: 0 %
Eosinophils Absolute: 0 10*3/uL (ref 0–0.7)
Eosinophils Relative: 1 %
HCT: 25.7 % — ABNORMAL LOW (ref 35.0–47.0)
HEMOGLOBIN: 9 g/dL — AB (ref 12.0–16.0)
LYMPHS ABS: 1 10*3/uL (ref 1.0–3.6)
LYMPHS PCT: 46 %
MCH: 30 pg (ref 26.0–34.0)
MCHC: 35.1 g/dL (ref 32.0–36.0)
MCV: 85.4 fL (ref 80.0–100.0)
Monocytes Absolute: 0.1 10*3/uL — ABNORMAL LOW (ref 0.2–0.9)
Monocytes Relative: 6 %
NEUTROS PCT: 47 %
Neutro Abs: 1 10*3/uL — ABNORMAL LOW (ref 1.4–6.5)
Platelets: 12 10*3/uL — CL (ref 150–440)
RBC: 3.01 MIL/uL — AB (ref 3.80–5.20)
RDW: 13.7 % (ref 11.5–14.5)
WBC: 2.1 10*3/uL — AB (ref 3.6–11.0)

## 2017-11-02 MED ORDER — LEVOFLOXACIN 500 MG PO TABS
500.0000 mg | ORAL_TABLET | Freq: Every day | ORAL | Status: DC
  Administered 2017-11-03 – 2017-11-05 (×3): 500 mg via ORAL
  Filled 2017-11-02 (×3): qty 1

## 2017-11-02 MED ORDER — HEPARIN SOD (PORK) LOCK FLUSH 100 UNIT/ML IV SOLN
500.0000 [IU] | Freq: Every day | INTRAVENOUS | Status: DC | PRN
  Filled 2017-11-02: qty 5

## 2017-11-02 MED ORDER — DIPHENHYDRAMINE HCL 25 MG PO CAPS
25.0000 mg | ORAL_CAPSULE | Freq: Once | ORAL | Status: AC
  Administered 2017-11-03: 25 mg via ORAL
  Filled 2017-11-02: qty 1

## 2017-11-02 MED ORDER — SODIUM CHLORIDE 0.9% FLUSH
3.0000 mL | INTRAVENOUS | Status: AC | PRN
  Administered 2017-11-02: 3 mL

## 2017-11-02 MED ORDER — SODIUM CHLORIDE 0.9% FLUSH
10.0000 mL | INTRAVENOUS | Status: AC | PRN
  Administered 2017-11-02: 10 mL

## 2017-11-02 MED ORDER — METHYLPREDNISOLONE SODIUM SUCC 40 MG IJ SOLR: 20.0000 mg | Freq: Once | INTRAMUSCULAR | Status: DC

## 2017-11-02 MED ORDER — FUROSEMIDE 10 MG/ML IJ SOLN
20.0000 mg | Freq: Three times a day (TID) | INTRAMUSCULAR | Status: DC
  Administered 2017-11-02 – 2017-11-04 (×6): 20 mg via INTRAVENOUS
  Filled 2017-11-02 (×6): qty 2

## 2017-11-02 MED ORDER — ACETAMINOPHEN 325 MG PO TABS
650.0000 mg | ORAL_TABLET | Freq: Once | ORAL | Status: AC
  Administered 2017-11-03: 650 mg via ORAL
  Filled 2017-11-02: qty 2

## 2017-11-02 MED ORDER — HEPARIN SOD (PORK) LOCK FLUSH 100 UNIT/ML IV SOLN
250.0000 [IU] | INTRAVENOUS | Status: DC | PRN
  Filled 2017-11-02: qty 5

## 2017-11-02 MED ORDER — SODIUM CHLORIDE 0.9% IV SOLUTION: 250.0000 mL | Freq: Once | INTRAVENOUS | Status: DC

## 2017-11-02 NOTE — Progress Notes (Signed)
Hematology/Oncology Progress Note [facility] [phone] [phone]  Patient Care Team: [name], MD as PCP - General (Family Medicine) End, Harrell Gave, MD as PCP - Cardiology (Cardiology) [name], DPM as Consulting Physician (Podiatry) [name], MD as Medical Oncologist (Hematology and Oncology) Robert Bellow, MD (General Surgery)   Name of the patient: Shelly Arias  [id number]  Apr 16, 1945  Date of visit: 11/02/17   INTERVAL HISTORY-  No acute overnight event.  Sitting at bedside. Reports that breathing is better since admission. Multiple family members at bedside.  No active bleeding.     Review of systems- Review of Systems  Constitutional: Positive for malaise/fatigue.  Eyes: Negative for pain and discharge.  Respiratory: Positive for shortness of breath. Negative for cough.   Cardiovascular: Negative for palpitations and orthopnea.  Gastrointestinal: Negative for abdominal pain, nausea and vomiting.  Musculoskeletal: Negative for falls.  Skin: Negative for rash.  Neurological: Negative for dizziness and sensory change.  Endo/Heme/Allergies: Bruises/bleeds easily.  Psychiatric/Behavioral: Negative for hallucinations.    Allergies  Allergen Reactions  . Codeine Anaphylaxis  . Fish-Derived Products Anaphylaxis  . Morphine Sulfate Other (See Comments)    REACTION: Hypotension  . Adhesive [Tape]     PAPER TAPE OK TO USE  . Oxycodone     Nausea and vomiting    Patient Active Problem List   Diagnosis Date Noted  . Non-ST elevation (NSTEMI) myocardial infarction ([place])   . Chest pain 10/30/2017  . Cellulitis 10/17/2017  . Malnutrition of moderate degree 08/06/2017  . Vaginal bleeding 08/05/2017  . Tachycardia 07/30/2017  . Constipation 12/27/2016  . B12 deficiency 10/09/2016  . MDS (myelodysplastic syndrome) ([place]) 09/24/2016  . GI bleed 08/03/2016  . Thrombocytopenia ([place])  08/03/2016  . Acute ITP ([place]) 07/09/2016  . Persistent headaches 07/02/2016  . Mass of upper inner quadrant of left breast 06/20/2016  . Carcinoma of overlapping sites of right breast in female, estrogen receptor positive ([place]) 06/08/2016  . Chronic fatigue 06/08/2016  . Nutritional anemia, unspecified 06/08/2016  . Prediabetes 01/25/2016  . Tobacco abuse 11/06/2012  . History of breast cancer 11/06/2012  . Osteopenia 11/08/2011  . Hyperlipidemia 11/02/2010  . Essential hypertension 12/23/2006  . GERD 12/23/2006     Past Medical History:  Diagnosis Date  . Blood dyscrasia   . Breast cancer ([place])    right, lumpectomy, radiation, chemo  . Breast cancer ([place])    Right, 2007  . Breast cancer ([place]) 06/20/2016   INVASIVE DUCTAL CARCINOMA.  . Collapsed lung 02/14/2017   RIGHT  . Complication of anesthesia    PT STATED AFTER BIL HIP SURGERIES SHE STOPPED BREATHING THE NIGHT AFTER SURGERY  . COPD (chronic obstructive pulmonary disease) ([place])   . GERD (gastroesophageal reflux disease)   . Headache   . History of chemotherapy   . History of methicillin resistant staphylococcus aureus (MRSA) 2011  . History of radiation therapy   . Hyperlipidemia   . Hypertension   . Liver cancer ([place]) 05/2016  . Lung cancer ([place]) 05/2016  . Osteoarthritis    right hip  . Osteoporosis   . Squamous cell carcinoma    leg, Followed by Dr. Nicole Kindred     Past Surgical History:  Procedure Laterality Date  . ABDOMINAL HYSTERECTOMY    . BREAST BIOPSY Left 2002   left breast, calcifications  . BREAST BIOPSY Left 06/20/2016   INVASIVE DUCTAL CARCINOMA.  Marland Kitchen BREAST EXCISIONAL BIOPSY Right 2007   positive  .  bunion repair    . ESOPHAGOGASTRODUODENOSCOPY (EGD) WITH PROPOFOL N/A 08/05/2016   Procedure: ESOPHAGOGASTRODUODENOSCOPY (EGD) WITH PROPOFOL;  Surgeon: San Jetty, MD;  Location: [facility] ENDOSCOPY;  Service: General;  Laterality: N/A;  . IR FLUORO GUIDE CV LINE RIGHT  07/25/2017  . JOINT  REPLACEMENT    . left breast biopsy    . NASAL SINUS SURGERY    . PORTACATH PLACEMENT Right 04/13/2017   Procedure: INSERTION PORT-A-CATH- RIGHT INTERNAL JUGULAR;  Surgeon: Robert Bellow, MD;  Location: [facility] ORS;  Service: General;  Laterality: Right;  . right hip replacement    . SHOULDER SURGERY    . SQUAMOUS CELL CARCINOMA EXCISION     right leg, Dr. Nicole Kindred  . TOTAL HIP ARTHROPLASTY     right    Social History   Socioeconomic History  . Marital status: Married    Spouse name: Not on file  . Number of children: Not on file  . Years of education: Not on file  . Highest education level: Not on file  Occupational History  . Not on file  Social Needs  . Financial resource strain: Not hard at all  . Food insecurity:    Worry: Never true    Inability: Never true  . Transportation needs:    Medical: No    Non-medical: No  Tobacco Use  . Smoking status: Current Some Day Smoker    Packs/day: 0.25    Years: 30.00    Pack years: 7.50    Types: Cigarettes  . Smokeless tobacco: Never Used  Substance and Sexual Activity  . Alcohol use: Not Currently    Alcohol/week: 0.0 standard drinks    Comment: socially  . Drug use: No  . Sexual activity: Not Currently  Lifestyle  . Physical activity:    Days per week: 4 days    Minutes per session: 30 min  . Stress: Not at all  Relationships  . Social connections:    Talks on phone: Three times a week    Gets together: Twice a week    Attends religious service: More than 4 times per year    Active member of club or organization: No    Attends meetings of clubs or organizations: Never    Relationship status: Married  . Intimate partner violence:    Fear of current or ex partner: No    Emotionally abused: No    Physically abused: No    Forced sexual activity: No  Other Topics Concern  . Not on file  Social History Narrative  . Not on file     Family History  Problem Relation Age of Onset  . Heart disease Father 49  .  Heart disease Mother   . Hyperlipidemia Mother   . Heart disease Brother   . Diabetes Brother   . Pancreatic cancer Brother   . Diabetes Maternal Grandmother   . Breast cancer Cousin   . Kidney disease Brother      Current Facility-Administered Medications:  .  0.9 %  sodium chloride infusion (Manually program via Guardrails IV Fluids), 250 mL, Intravenous, Once, Earlie Server, MD .  acetaminophen (TYLENOL) tablet 650 mg, 650 mg, Oral, Q4H PRN, Arta Silence, MD, 650 mg at 10/31/17 1829 .  acetaminophen (TYLENOL) tablet 650 mg, 650 mg, Oral, Once, Earlie Server, MD .  acyclovir (ZOVIRAX) 200 MG capsule 400 mg, 400 mg, Oral, BID, Arta Silence, MD, 400 mg at 11/02/17 2208 .  ALPRAZolam (XANAX) tablet 0.5 mg, 0.5 mg,  Oral, Q8H PRN, Arta Silence, MD, 0.5 mg at 10/31/17 2210 .  alum & mag hydroxide-simeth (MAALOX/MYLANTA) 200-200-20 MG/5ML suspension 15 mL, 15 mL, Oral, Q4H PRN, Jodell Cipro, Prasanna, MD .  antiseptic oral rinse (BIOTENE) solution 15 mL, 15 mL, Mouth Rinse, PRN, Mayo, Pete Pelt, MD .  bisacodyl (DULCOLAX) EC tablet 5 mg, 5 mg, Oral, Daily PRN, Arta Silence, MD .  diphenhydrAMINE (BENADRYL) capsule 25 mg, 25 mg, Oral, QHS PRN, Lance Coon, MD, 25 mg at 10/31/17 1829 .  diphenhydrAMINE (BENADRYL) capsule 25 mg, 25 mg, Oral, Once, Earlie Server, MD .  famotidine (PEPCID) tablet 20 mg, 20 mg, Oral, BID PRN, Arta Silence, MD .  fluconazole (DIFLUCAN) tablet 100 mg, 100 mg, Oral, Daily, Epifanio Lesches, MD, 100 mg at 11/01/17 1015 .  fluticasone furoate-vilanterol (BREO ELLIPTA) 100-25 MCG/INH 1 puff, 1 puff, Inhalation, Daily, Harrie Foreman, MD, 1 puff at 11/02/17 1235 .  furosemide (LASIX) injection 20 mg, 20 mg, Intravenous, Q8H, Gollan, Kathlene November, MD, 20 mg at 11/02/17 2209 .  guaiFENesin (MUCINEX) 12 hr tablet 1,200 mg, 1,200 mg, Oral, BID PRN, Jodell Cipro, Prasanna, MD, 1,200 mg at 11/01/17 0424 .  heparin lock flush 100 unit/mL, 500 Units,  Intracatheter, Daily PRN, Earlie Server, MD .  heparin lock flush 100 unit/mL, 250 Units, Intracatheter, PRN, Earlie Server, MD .  ipratropium-albuterol (DUONEB) 0.5-2.5 (3) MG/3ML nebulizer solution 3 mL, 3 mL, Nebulization, Q6H, Epifanio Lesches, MD, 3 mL at 11/02/17 2024 .  isosorbide mononitrate (IMDUR) 24 hr tablet 30 mg, 30 mg, Oral, Daily, Theora Gianotti, NP, 30 mg at 11/02/17 0736 .  letrozole [facility]) tablet 2.5 mg, 2.5 mg, Oral, Daily, Jodell Cipro, Prasanna, MD, 2.5 mg at 11/02/17 1234 .  [START ON 11/03/2017] levofloxacin (LEVAQUIN) tablet 500 mg, 500 mg, Oral, Daily, Ramond Dial, RPH .  megestrol (MEGACE) 400 MG/10ML suspension 400 mg, 400 mg, Oral, BID, Jodell Cipro, Prasanna, MD, 400 mg at 11/02/17 2210 .  methylPREDNISolone sodium succinate (SOLU-MEDROL) 40 mg/mL injection 20 mg, 20 mg, Intravenous, Once, Earlie Server, MD .  metoprolol succinate (TOPROL-XL) 24 hr tablet 50 mg, 50 mg, Oral, Daily, Kathlyn Sacramento A, MD, 50 mg at 11/02/17 0738 .  morphine 2 MG/ML injection 1-2 mg, 1-2 mg, Intravenous, Q3H PRN, Epifanio Lesches, MD, 2 mg at 11/02/17 1533 .  multivitamin with minerals tablet 1 tablet, 1 tablet, Oral, Daily, Epifanio Lesches, MD, 1 tablet at 11/02/17 1240 .  nicotine (NICODERM CQ - dosed in mg/24 hours) patch 21 mg, 21 mg, Transdermal, Daily, Jodell Cipro, Prasanna, MD, 21 mg at 11/02/17 1250 .  nitroGLYCERIN (NITROSTAT) SL tablet 0.4 mg, 0.4 mg, Sublingual, Q5 min PRN, Jodell Cipro, Prasanna, MD .  ondansetron (ZOFRAN) injection 4 mg, 4 mg, Intravenous, Q6H PRN, Jodell Cipro, Prasanna, MD .  pantoprazole (PROTONIX) EC tablet 40 mg, 40 mg, Oral, BID, Jodell Cipro, Prasanna, MD, 40 mg at 11/02/17 2208 .  pravastatin (PRAVACHOL) tablet 40 mg, 40 mg, Oral, Daily, Jodell Cipro, Prasanna, MD, 40 mg at 11/02/17 0737 .  promethazine (PHENERGAN) injection 12.5 mg, 12.5 mg, Intravenous, Q6H PRN, Jodell Cipro, Prasanna, MD .  senna-docusate (Senokot-S) tablet 1 tablet, 1 tablet, Oral, QHS  PRN, Jodell Cipro, Prasanna, MD .  sodium chloride flush (NS) 0.9 % injection 10 mL, 10 mL, Intracatheter, PRN, Earlie Server, MD .  sodium chloride flush (NS) 0.9 % injection 3 mL, 3 mL, Intracatheter, PRN, Earlie Server, MD .  sucralfate (CARAFATE) 1 GM/10ML suspension 1 g, 1 g, Oral, TID WC & HS, Epifanio Lesches, MD, 1 g at 11/02/17  2208 .  Tbo-Filgrastim (GRANIX) injection 480 mcg, 480 mcg, Subcutaneous, Daily, [name], MD, 480 mcg at 11/02/17 1250 .  vitamin C (ASCORBIC ACID) tablet 250 mg, 250 mg, Oral, BID, Epifanio Lesches, MD, 250 mg at 11/02/17 2208  Facility-Administered Medications Ordered in Other Encounters:  .  0.9 %  sodium chloride infusion, , Intravenous, Continuous, [name], MD, Stopped at 06/28/17 1450 .  0.9 %  sodium chloride infusion, , Intravenous, Continuous, [name], MD, Stopped at 07/29/17 1515 .  heparin lock flush 100 unit/mL, 500 Units, Intravenous, Once, Brahmanday, Lenetta Quaker R, MD .  sodium chloride flush (NS) 0.9 % injection 10 mL, 10 mL, Intravenous, PRN, Charlaine Dalton R, MD .  sodium chloride flush (NS) 0.9 % injection 10 mL, 10 mL, Intravenous, PRN, Verlon Au, NP .  sodium chloride flush (NS) 0.9 % injection 10 mL, 10 mL, Intravenous, PRN, [name], MD, 10 mL at 07/29/17 1405   Physical exam:  Vitals:   11/02/17 0733 11/02/17 1613 11/02/17 1951 11/02/17 2024  BP: 108/72 105/64 120/73   Pulse: (!) 111 (!) 108 (!) 110   Resp: 18 18 18    Temp: 97.9 F (36.6 C) 98 F (36.7 C) 98.4 F (36.9 C)   TempSrc: Oral Oral Oral   SpO2: 100% 100% 98% 97%  Weight:      Height:       GENERAL: No distress, frail SKIN:  No rashes or significant lesions  HEAD: Normocephalic, No masses, lesions, tenderness or abnormalities  EYES: Conjunctiva are pink, non icteric ENT: External ears normal ,lips , buccal mucosa, and tongue normal and mucous membranes are moist  LYMPH: No palpable cervical and axillary  lymphadenopathy  LUNGS: bibasilar diminished breath sounds  HEART: tachycardia ABDOMEN: Abdomen soft, non-tender, normal bowel sounds, I did not appreciate any  masses or organomegaly  MUSCULOSKELETAL: No CVA tenderness and no tenderness on percussion of the back or rib cage.  EXTREMITIES: +1 edema,  NEURO: Alert & oriented,      CMP Latest Ref Rng & Units 11/02/2017  Glucose 70 - 99 mg/dL 152(H)  BUN 8 - 23 mg/dL 18  Creatinine 0.44 - 1.00 mg/dL 0.78  Sodium 135 - 145 mmol/L 133(L)  Potassium 3.5 - 5.1 mmol/L 4.0  Chloride 98 - 111 mmol/L 93(L)  CO2 22 - 32 mmol/L 31  Calcium 8.9 - 10.3 mg/dL 8.4(L)  Total Protein 6.5 - 8.1 g/dL -  Total Bilirubin 0.3 - 1.2 mg/dL -  Alkaline Phos 38 - 126 U/L -  AST 15 - 41 U/L -  ALT 0 - 44 U/L -   CBC Latest Ref Rng & Units 11/02/2017  WBC 3.6 - 11.0 K/uL 2.1(L)  Hemoglobin 12.0 - 16.0 g/dL 9.0(L)  Hematocrit 35.0 - 47.0 % 25.7(L)  Platelets 150 - 440 K/uL 12(LL)    RADIOGRAPHIC STUDIES: I have personally reviewed the radiological images as listed and agreed with the findings in the report.  Dg Chest 2 View  Result Date: 10/29/2017 CLINICAL DATA:  Chest pain EXAM: CHEST - 2 VIEW COMPARISON:  07/21/2017, PET-CT 06/03/2017 FINDINGS: Right-sided central venous port tip over the proximal right atrium. Small bilateral pleural effusions and bibasilar airspace disease. Stable cardiomediastinal silhouette with aortic atherosclerosis. No pneumothorax. IMPRESSION: Small bilateral pleural effusions with bibasilar and right middle lobe airspace disease suspicious for pneumonia. Electronically Signed   By: Donavan Foil M.D.   On: 10/29/2017 21:23   Ct Head Wo Contrast  Result Date:  10/31/2017 CLINICAL DATA:  72 year old female with severe headache today. History of breast cancer. EXAM: CT HEAD WITHOUT CONTRAST TECHNIQUE: Contiguous axial images were obtained from the base of the skull through the vertex without intravenous contrast. COMPARISON:   07/03/2016 brain MR FINDINGS: Brain: No evidence of acute infarction, hemorrhage, hydrocephalus, extra-axial collection or mass lesion/mass effect. Atrophy and mild chronic small-vessel white matter ischemic changes again noted. Vascular: No hyperdense vessel or unexpected calcification. Skull: Normal. Negative for fracture or focal lesion. Sinuses/Orbits: No acute finding. Other: None. IMPRESSION: 1. No evidence of acute intracranial abnormality 2. Atrophy and mild chronic small-vessel white matter ischemic changes. Electronically Signed   By: Margarette Canada M.D.   On: 10/31/2017 13:30   Ct Angio Chest Pe W And/or Wo Contrast  Result Date: 10/29/2017 CLINICAL DATA:  Chest pain.  Malignancy history. EXAM: CT ANGIOGRAPHY CHEST WITH CONTRAST TECHNIQUE: Multidetector CT imaging of the chest was performed using the standard protocol during bolus administration of intravenous contrast. Multiplanar CT image reconstructions and MIPs were obtained to evaluate the vascular anatomy. CONTRAST:  32mL ISOVUE-370 IOPAMIDOL (ISOVUE-370) INJECTION 76% COMPARISON:  PET-CT 06/03/2017, chest CT 02/25/2017. Radiographs earlier this day. FINDINGS: Cardiovascular: There are no filling defects within the pulmonary arteries to suggest pulmonary embolus. Aortic atherosclerosis without aneurysm. No gross dissection allowing for phase of contrast. Mild right heart dilatation with contrast refluxing into the IVC. Small to moderate pericardial effusion measuring up to 14 mm in depth. Accessed right internal jugular port with tip in the distal SVC. Mediastinum/Nodes: Multiple small mediastinal nodes not enlarged by size criteria. Soft tissue density in the right hilum in the region of bronchus intermedius and origin of the right mainstem bronchus. No left hilar adenopathy. No axillary adenopathy. Esophagus is decompressed, partially obscured by adjacent pleural fluid. Right axillary surgical clips, no axillary adenopathy. Lungs/Pleura: Moderate  right and small to moderate left pleural effusion. Adjacent compressive atelectasis in both lower lobes. Patchy ground-glass opacities with mild septal thickening in both lower lobes may represent pulmonary edema. Near complete occlusion of the origin of the right middle lobe bronchus, unclear if this is secondary to bronchial filling or external compression, associated volume loss and patchy ground-glass opacities throughout the right middle lobe. There is improved aeration compared to 02/25/2017 CT but worsened since 06/03/2017 PET. Irregular left apical opacity has a linear morphology on axial imaging but spiculated nodular appearance on coronal reformats and measures approximately 1.5 x 1.8 x 1.3 cm, image 20 series 5. Pleural tail abuts the medial pleural. Small nodule in the anterior right upper lobe measuring approximately 8 x 3 mm is unchanged from prior exams, image 39 series 6. Mild upper lobe predominant emphysema. Upper Abdomen: No acute findings of the visualized upper abdomen. Musculoskeletal: No lytic or blastic osseous lesions. Scoliosis and degenerative change in the spine. Review of the MIP images confirms the above findings. IMPRESSION: 1. No pulmonary embolus. 2. Moderate right and small to moderate left pleural effusion. Patchy opacities with septal thickening in the lower lobes suspicious for pulmonary edema, in conjunction with contrast refluxing into the IVC. Findings suggesting mild congestive heart failure. 3. Small to moderate pericardial effusion. 4. Irregular left apical nodular opacity measuring up to 1.8 cm, new from prior PET. Given history of malignancy, this is nonspecific for infectious/inflammatory process versus metastasis. Consider repeat PET characterization versus follow-up CT in 3 months. 5. Recurrent partial collapse of the right middle lobe with near complete occlusion at the origin of the right middle lobe bronchus,  unclear if this is due to intraluminal filling or  external compression. Collapse was not present on most recent PET imaging but was seen on chest CT 8 months prior, suggesting this is intermittent. Aortic Atherosclerosis (ICD10-I70.0) and Emphysema (ICD10-J43.9). Electronically Signed   By: Jeb Levering M.D.   On: 10/29/2017 23:22   US Venous Img Lower Bilateral  Result Date: 10/17/2017 CLINICAL DATA:  72 year old female with bilateral lower extremity edema for the past 2 months EXAM: BILATERAL LOWER EXTREMITY VENOUS DOPPLER ULTRASOUND TECHNIQUE: Gray-scale sonography with graded compression, as well as color Doppler and duplex ultrasound were performed to evaluate the lower extremity deep venous systems from the level of the common femoral vein and including the common femoral, femoral, profunda femoral, popliteal and calf veins including the posterior tibial, peroneal and gastrocnemius veins when visible. The superficial great saphenous vein was also interrogated. Spectral Doppler was utilized to evaluate flow at rest and with distal augmentation maneuvers in the common femoral, femoral and popliteal veins. COMPARISON:  None. FINDINGS: RIGHT LOWER EXTREMITY Common Femoral Vein: No evidence of thrombus. Normal compressibility, respiratory phasicity and response to augmentation. Increased pulsatility of the venous waveform. Saphenofemoral Junction: No evidence of thrombus. Normal compressibility and flow on color Doppler imaging. Profunda Femoral Vein: No evidence of thrombus. Normal compressibility and flow on color Doppler imaging. Femoral Vein: No evidence of thrombus. Normal compressibility, respiratory phasicity and response to augmentation. Popliteal Vein: No evidence of thrombus. Normal compressibility, respiratory phasicity and response to augmentation. Calf Veins: No evidence of thrombus. Normal compressibility and flow on color Doppler imaging. Superficial Great Saphenous Vein: No evidence of thrombus. Normal compressibility. Venous Reflux:  None.  Other Findings:  None. LEFT LOWER EXTREMITY Common Femoral Vein: No evidence of thrombus. Normal compressibility, respiratory phasicity and response to augmentation. Increased pulsatility of the venous waveform. Saphenofemoral Junction: No evidence of thrombus. Normal compressibility and flow on color Doppler imaging. Profunda Femoral Vein: No evidence of thrombus. Normal compressibility and flow on color Doppler imaging. Femoral Vein: No evidence of thrombus. Normal compressibility, respiratory phasicity and response to augmentation. Popliteal Vein: No evidence of thrombus. Normal compressibility, respiratory phasicity and response to augmentation. Calf Veins: No evidence of thrombus. Normal compressibility and flow on color Doppler imaging. Superficial Great Saphenous Vein: No evidence of thrombus. Normal compressibility. Venous Reflux:  None. Other Findings:  None. IMPRESSION: 1. No evidence of deep venous thrombosis in either lower extremity. 2. Increased pulsatility of the venous waveforms as can be seen in the setting of elevated right heart pressures. Differential considerations include tricuspid regurgitation, right-sided heart dysfunction/failure, pulmonary arterial hypertension and chronic COPD. Electronically Signed   By: Jacqulynn Cadet M.D.   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  Platelet counts decreased to 12,000 no acute bleeding. History of refractory thrombocytopenia.  Transfuse 1 unit of cross matched/washed platelet. Discussed with blood bank.  Anemia, hemoglobin stable at 9 today.  Neutropenia, ANC 1  # Acute CHF/NSTEMI not candidate for any antiplatelet or antithrombotic therapy. Continue beta blocker, nitrate, Statin.  Morphine PRN for chest pain. On Lasix Q8 hours treatment.  Clinically stable.   # Stage IV breast cancer, stable. # Agree with palliative consult. Discussed with  patient and family members that patient's overall prognosis is poor. If continue to declines, comfortable care is reasonable.  Discussed with Dr.Pyreddy.  Thank you for allowing me to participate in the care of this patient.  Total  face to face encounter time for this patient visit was 25 min. >50% of the time was  spent in counseling and coordination of care.  Earlie Server, MD, PhD Hematology Oncology [facility] Pager- [phone] 11/02/2017

## 2017-11-02 NOTE — Progress Notes (Signed)
Progress Note  Patient Name: Shelly Arias Date of Encounter: 11/02/2017  Primary Cardiologist: Nelva Bush, MD  Subjective   Short of breath when she woke up this morning Felt like a panic attack Slow improvement of her shortness of breath this morning 1 liter negative On nasal cannula oxygen Low platelets down to 12 Tachycardic on telemetry   Inpatient Medications    Scheduled Meds: . sodium chloride  250 mL Intravenous Once  . acetaminophen  650 mg Oral Once  . acyclovir  400 mg Oral BID  . diphenhydrAMINE  25 mg Oral Once  . fluconazole  100 mg Oral Daily  . fluticasone furoate-vilanterol  1 puff Inhalation Daily  . furosemide  20 mg Intravenous Q8H  . ipratropium-albuterol  3 mL Nebulization Q6H  . isosorbide mononitrate  30 mg Oral Daily  . letrozole  2.5 mg Oral Daily  . levofloxacin  250 mg Oral Daily  . megestrol  400 mg Oral BID  . methylPREDNISolone (SOLU-MEDROL) injection  20 mg Intravenous Once  . metoprolol succinate  50 mg Oral Daily  . multivitamin with minerals  1 tablet Oral Daily  . nicotine  21 mg Transdermal Daily  . pantoprazole  40 mg Oral BID  . pravastatin  40 mg Oral Daily  . sucralfate  1 g Oral TID WC & HS  . Tbo-filgastrim (GRANIX) SQ  480 mcg Subcutaneous Daily  . vitamin C  250 mg Oral BID   Continuous Infusions:  PRN Meds: acetaminophen, ALPRAZolam, alum & mag hydroxide-simeth, antiseptic oral rinse, bisacodyl, diphenhydrAMINE, famotidine, guaiFENesin, heparin lock flush, heparin lock flush, morphine injection, nitroGLYCERIN, ondansetron (ZOFRAN) IV, promethazine, senna-docusate, sodium chloride flush, sodium chloride flush   Vital Signs    Vitals:   11/01/17 1922 11/01/17 1959 11/02/17 0350 11/02/17 0733  BP: 119/86  109/76 108/72  Pulse: (!) 122  (!) 117 (!) 111  Resp: 18  18 18   Temp: 97.8 F (36.6 C)  98.3 F (36.8 C) 97.9 F (36.6 C)  TempSrc:   Oral Oral  SpO2: 100% 97% 95% 100%  Weight:      Height:          Intake/Output Summary (Last 24 hours) at 11/02/2017 1239 Last data filed at 11/02/2017 0400 Gross per 24 hour  Intake -  Output 1000 ml  Net -1000 ml   Filed Weights   10/29/17 2059 10/30/17 1347 11/01/17 0359  Weight: 58.1 kg 60.4 kg 59.6 kg    Physical Exam   Constitutional:  oriented to person, place, and time. Frail, shortness of breath on nasal cannula oxygen HENT:  Head: Normocephalic and atraumatic.  Eyes:  no discharge. No scleral icterus.  Neck: Normal range of motion. Neck supple. No JVD present.  Cardiovascular: Normal rate, regular rhythm, normal heart sounds and intact distal pulses. Exam reveals no gallop and no friction rub. No edema No murmur heard. Pulmonary/Chest: increased effort, dullness at the bases bilaterally, Rales  Abdominal: Soft.  no distension.  no tenderness.  Musculoskeletal: Normal range of motion.  no  tenderness or deformity.  Neurological:  normal muscle tone. Coordination normal. No atrophy Skin: Skin is warm and dry. No rash noted. not diaphoretic.  Psychiatric:  normal mood and affect. behavior is normal. Thought content normal.      Labs    Chemistry Recent Labs  Lab 10/29/17 2106 11/01/17 1322 11/02/17 0414  NA 135 132* 133*  K 4.1 3.1* 4.0  CL 100 94* 93*  CO2 26 30  31  GLUCOSE 191* 158* 152*  BUN 27* 22 18  CREATININE 1.11* 0.78 0.78  CALCIUM 8.9 8.6* 8.4*  PROT 5.4*  --   --   ALBUMIN 2.8*  --   --   AST 104*  --   --   ALT 100*  --   --   ALKPHOS 96  --   --   BILITOT 0.9  --   --   GFRNONAA 49* >60 >60  GFRAA 57* >60 >60  ANIONGAP 9 8 9      Hematology Recent Labs  Lab 10/31/17 0856 11/01/17 1001 11/02/17 0414  WBC 1.4* 1.8* 2.1*  RBC 2.59* 3.17* 3.01*  HGB 7.6* 9.4* 9.0*  HCT 22.0* 27.1* 25.7*  MCV 84.9 85.5 85.4  MCH 29.5 29.6 30.0  MCHC 34.7 34.6 35.1  RDW 13.7 13.5 13.7  PLT 34* 21* 12*    Cardiac Enzymes Recent Labs  Lab 10/29/17 2106 10/30/17 0330 10/30/17 0753  TROPONINI 0.73*   0.77* 3.26* 5.48*    BNP Recent Labs  Lab 10/30/17 0753  BNP 2,675.0*      Radiology    Ct Head Wo Contrast  Result Date: 10/31/2017 CLINICAL DATA:  72 year old female with severe headache today. History of breast cancer. EXAM: CT HEAD WITHOUT CONTRAST TECHNIQUE: Contiguous axial images were obtained from the base of the skull through the vertex without intravenous contrast. COMPARISON:  07/03/2016 brain MR FINDINGS: Brain: No evidence of acute infarction, hemorrhage, hydrocephalus, extra-axial collection or mass lesion/mass effect. Atrophy and mild chronic small-vessel white matter ischemic changes again noted. Vascular: No hyperdense vessel or unexpected calcification. Skull: Normal. Negative for fracture or focal lesion. Sinuses/Orbits: No acute finding. Other: None. IMPRESSION: 1. No evidence of acute intracranial abnormality 2. Atrophy and mild chronic small-vessel white matter ischemic changes. Electronically Signed   By: Margarette Canada M.D.   On: 10/31/2017 13:30    Telemetry    Sinus tachycardia - 100 - 120's - Personally Reviewed  Cardiac Studies   2D Echocardiogram 8.9.2019  Left ventricle: The cavity size was normal. Wall thickness was   normal. Systolic function was severely reduced. The estimated   ejection fraction was in the range of 20% to 25%. Diffuse   hypokinesis. The study is not technically sufficient to allow   evaluation of LV diastolic function. - Mitral valve: Calcified annulus. There was mild regurgitation. - Pulmonary arteries: Systolic pressure could not be accurately   estimated. - Inferior vena cava: The vessel was moderately dilated. The   respirophasic diameter changes were blunted (< 50%), consistent   with elevated central venous pressure. - Pericardium, extracardiac: A small pericardial effusion was   identified. There was no evidence of hemodynamic compromise. _____________   Patient Profile     72 y.o. 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  She presented with shortness of breath and was found to have non-ST elevation myocardial infarction.   Assessment & Plan    1.  NSTEMI:   Trop peaked @ 5.48 on 8/21   not a candidate for cardiac catheterization at this time On beta blockers, nitrates, statin Morphine for chest pain  2.  Acute on chronic systolic CHF:   EF 33-82% by echo 8/9.   Lasix up to 20 mg IV every 8 hours Waking up with panic, shortness of breath consistent with  systolic CHF Stable renal function Likely exacerbated by tachycardia and anemia  3.  Idiopathic bone marrow failure w/ h/o stage IV breast cancer:  Followed by oncology.   S/p 1u prbc Scheduled for platelets, match coming out of [place] per the patient  --will order SCDS High risk of DVT Not a good candidate for Lovenox or subcutaneous heparin  Long discussion with family above concerning cardiac issues  Total encounter time more than 35 minutes  Greater than 50% was spent in counseling and coordination of care with the patient   Signed, Ida Rogue, MD  11/02/2017, 12:39 PM    For questions or updates, please contact   Please consult [email or web address] for contact info under Cardiology/STEMI.

## 2017-11-02 NOTE — Progress Notes (Signed)
Levofloxacin dose increased back to home prophylaxis dose of 500mg  daily  Jobany Montellano D Lil Lepage, Pharm.D, BCPS Clinical Pharmacist

## 2017-11-02 NOTE — Progress Notes (Signed)
Shelly Arias    MR#:  [id number]  DATE OF BIRTH:  1946/02/11  SUBJECTIVE: Admitted because of chest pain, non-ST elevation MI, acute on chronic systolic heart failure.  Patient has history of MDS, bone marrow failure, gets recurrent blood transfusion, platelet transfusion, received 1 unit of packed RBC transfusion yesterday, after that patient had trouble breathing, wheezing, could not rest last night.  Today she is very weak, has mild chest pain, shortness of breath.  CHIEF COMPLAINT:   Chief Complaint  Patient presents with  . Chest Pain  Patient seen and evaluated today No spontaneous bleeding Decreased shortness of breath No complaints of any chest pain  REVIEW OF SYSTEMS:   ROS CONSTITUTIONAL: No fever, fatigue, appears pale, appears chronically ill.  Cachectic.  EYES: No blurred or double vision.  EARS, NOSE, AND THROAT: No tinnitus or ear pain.  RESPIRATORY:  shortness of breath, mild chest pain CARDIOVASCULAR: Has some chest pain on and off.   GASTROINTESTINAL: No nausea, vomiting, diarrhea or abdominal pain.  GENITOURINARY: No dysuria, hematuria.  ENDOCRINE: No polyuria, nocturia,  HEMATOLOGY: No anemia, easy bruising or bleeding SKIN: No rash or lesion. MUSCULOSKELETAL: Leg edema.  Marland Kitchen  NEUROLOGIC: No tingling, numbness, weakness.  PSYCHIATRY: No anxiety or depression.   DRUG ALLERGIES:   Allergies  Allergen Reactions  . Codeine Anaphylaxis  . Fish-Derived Products Anaphylaxis  . Morphine Sulfate Other (See Comments)    REACTION: Hypotension  . Adhesive [Tape]     PAPER TAPE OK TO USE  . Oxycodone     Nausea and vomiting    VITALS:  Blood pressure 108/72, pulse (!) 111, temperature 97.9 F (36.6 C), temperature source Oral, resp. rate 18, height 5\' 1"  (1.549 m), weight 59.6 kg, SpO2 100 %.  PHYSICAL EXAMINATION:  GENERAL:  72 y.o.-year-old patient lying in the bed with no acute  distress.  EYES: Pupils equal, round, reactive to light and accommodation. No scleral icterus. Extraocular muscles intact.  HEENT: Head atraumatic, normocephalic. Oropharynx and nasopharynx clear.  NECK:  Supple, no jugular venous distention. No thyroid enlargement, no tenderness.  LUNGS: Normal breath sounds bilaterally, no wheezing, rales,rhonchi or crepitation. No use of accessory muscles of respiration.  CARDIOVASCULAR: S1, S2 normal. No murmurs, rubs, or gallops.  ABDOMEN: Soft, nontender, nondistended. Bowel sounds present. No organomegaly or mass.  EXTREMITIES: Bilateral leg edema 1+. NEUROLOGIC: Cranial nerves II through XII are intact. Muscle strength 5/5 in all extremities. Sensation intact. Gait not checked.  PSYCHIATRIC: The patient is alert and oriented x 3.  SKIN: No obvious rash, lesion, or ulcer.    LABORATORY PANEL:   CBC Recent Labs  Lab 11/02/17 0414  WBC 2.1*  HGB 9.0*  HCT 25.7*  PLT 12*   ------------------------------------------------------------------------------------------------------------------  Chemistries  Recent Labs  Lab 10/29/17 2106  11/02/17 0414  NA 135   < > 133*  K 4.1   < > 4.0  CL 100   < > 93*  CO2 26   < > 31  GLUCOSE 191*   < > 152*  BUN 27*   < > 18  CREATININE 1.11*   < > 0.78  CALCIUM 8.9   < > 8.4*  MG 1.7  --   --   AST 104*  --   --   ALT 100*  --   --   ALKPHOS 96  --   --   BILITOT 0.9  --   --    < > =  values in this interval not displayed.   ------------------------------------------------------------------------------------------------------------------  Cardiac Enzymes Recent Labs  Lab 10/30/17 0753  TROPONINI 5.48*   ------------------------------------------------------------------------------------------------------------------  RADIOLOGY:  No results found.  EKG:   Orders placed or performed during the hospital encounter of 10/29/17  . ED EKG within 10 minutes  . ED EKG within 10 minutes  . EKG  12-Lead  . EKG 12-Lead  . EKG 12-Lead  . EKG 12-Lead  . EKG 12-Lead    ASSESSMENT AND PLAN:   72 year old female patient with underlying metastatic right breast cancer, myelodysplastic syndrome with marrow failure gets recurrent blood transfusions and platelet transfusion every week at cancer center comes in with chest pain and found to have elevated troponins and admitted to telemetry.  1. Acute chest pain secondary to non-ST elevation MI, patient is not candidate for aggressive intervention secondary to her underlying myelodysplastic syndrome.  Continue conservative treatment with beta-blockers, ACE inhibitors, nitrates. Status post cardiology follow-up Because of persistent tachycardia beta-blockers are started yesterday.  Start long-acting nitrate today,  Continue statins. Not a candidate for Lovenox and heparin  2.  Acute on chronic systolic heart failure with EF 25%, patient elevated BNP.  patient is on low-dose Lasix IV.  Patient is on Toprol XL 50 mg daily to help with tachycardia.  Likely discharge her on low-dose ACE inhibitors/ARB as far as the blood pressure improves and stable on Toprol, Lasix. .  3.  Idiopathic bone marrow my failure, stage IV breast cancer Recurrent anemia, thrombocytopenia, gets irradiated blood, platelets every week at cancer center, status post 1 unit of packed RBC transfusion yesterday after that shortness of breath got worse and today she is weaker.  And platelets again dropped to 12 today.  Overall prognosis really poor with her acute MI, multiple other medical problems of MDS, pulmonary failure, heart failure Hematology follow-up and further platelet transfusion as per hematology  Prognosis really really poor in light of non-ST elevation MI Patient is DNR  4.  Severe malnutrition in the context of chronic illness.  Seen by dietitian, patient continue Ensure supplements.  5.  Chronic infection prophylaxis with acyclovir, Levaquin.  6.Prognosis  extremely poor, high risk for cardiac arrest.   All the records are reviewed and case discussed with Care Management/Social Workerr. Management plans discussed with the patient, family and they are in agreement.  CODE STATUS: DNR  TOTAL TIME TAKING CARE OF THIS PATIENT: 36 minutes.   More than 50% time spent in counseling, coordination of care, discussed with multiple family members especially the son, daughter at bedside.  Spoke with Dr.Yu  from oncology.    POSSIBLE D/C IN 2 to 3 DAYS, DEPENDING ON CLINICAL CONDITION.   Saundra Shelling M.D on 11/02/2017 at 2:03 PM  Between 7am to 6pm - Pager - [phone]  After 6pm go to [email or web address] - password [email or web address] [facility]  [facility]  Office  [phone]  CC: Primary care physician; [name], MD   Note: This dictation was prepared with Dragon dictation along with smaller phrase technology. Any transcriptional errors that result from this process are unintentional.

## 2017-11-02 NOTE — Progress Notes (Signed)
CRITICAL VALUE ALERT  Critical Value:  Platelet 12  Date & Time Notied:  11/02/17 0622  Provider Notified: Dr.Diamond  Orders Received/Actions taken: No new orders

## 2017-11-03 LAB — TYPE AND SCREEN
ABO/RH(D): O POS
Antibody Screen: POSITIVE
Donor AG Type: NEGATIVE
UNIT DIVISION: 0
UNIT DIVISION: 0
Unit division: 0

## 2017-11-03 LAB — BPAM RBC
BLOOD PRODUCT EXPIRATION DATE: 201909232359
BLOOD PRODUCT EXPIRATION DATE: 201909242359
Blood Product Expiration Date: 201909052359
ISSUE DATE / TIME: 201908221842
UNIT TYPE AND RH: 5100
Unit Type and Rh: 5100
Unit Type and Rh: 5100

## 2017-11-03 LAB — CBC WITH DIFFERENTIAL/PLATELET
BASOS PCT: 0 %
Basophils Absolute: 0 10*3/uL (ref 0–0.1)
Eosinophils Absolute: 0 10*3/uL (ref 0–0.7)
Eosinophils Relative: 0 %
HEMATOCRIT: 24.3 % — AB (ref 35.0–47.0)
Hemoglobin: 8.5 g/dL — ABNORMAL LOW (ref 12.0–16.0)
Lymphocytes Relative: 42 %
Lymphs Abs: 0.9 10*3/uL — ABNORMAL LOW (ref 1.0–3.6)
MCH: 29.9 pg (ref 26.0–34.0)
MCHC: 35.1 g/dL (ref 32.0–36.0)
MCV: 85.3 fL (ref 80.0–100.0)
MONO ABS: 0.1 10*3/uL — AB (ref 0.2–0.9)
Monocytes Relative: 6 %
NEUTROS ABS: 1.1 10*3/uL — AB (ref 1.4–6.5)
Neutrophils Relative %: 52 %
Platelets: 6 10*3/uL — CL (ref 150–440)
RBC: 2.85 MIL/uL — ABNORMAL LOW (ref 3.80–5.20)
RDW: 13.7 % (ref 11.5–14.5)
WBC: 2.1 10*3/uL — ABNORMAL LOW (ref 3.6–11.0)

## 2017-11-03 LAB — PLATELET COUNT: PLATELETS: 44 10*3/uL — AB (ref 150–440)

## 2017-11-03 MED ORDER — LORAZEPAM 0.5 MG PO TABS
0.5000 mg | ORAL_TABLET | Freq: Three times a day (TID) | ORAL | Status: DC | PRN
  Administered 2017-11-04: 0.5 mg via ORAL
  Filled 2017-11-03 (×2): qty 1

## 2017-11-03 MED ORDER — SODIUM CHLORIDE 0.9% FLUSH: 10.0000 mL | INTRAVENOUS | Status: DC | PRN

## 2017-11-03 MED ORDER — SODIUM CHLORIDE 0.9% FLUSH
3.0000 mL | Freq: Two times a day (BID) | INTRAVENOUS | Status: DC
  Administered 2017-11-03 – 2017-11-05 (×2): 3 mL via INTRAVENOUS

## 2017-11-03 MED ORDER — SODIUM CHLORIDE 0.9% FLUSH
10.0000 mL | Freq: Two times a day (BID) | INTRAVENOUS | Status: DC
  Administered 2017-11-03 – 2017-11-05 (×5): 10 mL

## 2017-11-03 NOTE — Progress Notes (Signed)
Hematology/Oncology Progress Note [facility] Telephone:([phone] [phone]  Patient Care Team: [name], MD as PCP - General (Family Medicine) End, Harrell Gave, MD as PCP - Cardiology (Cardiology) [name], DPM as Consulting Physician (Podiatry) [name], MD as Medical Oncologist (Hematology and Oncology) Robert Bellow, MD (General Surgery)   Name of the patient: Shelly Arias  [id number]  [birth year]  Date of visit: 11/03/17   INTERVAL HISTORY-  No acute overnight event. Patient sits in the bed, breathing oxygen via nasal cannula. SOB is same. Husband and son were at bedside. No acute bleeding event.  Platelet was not available yesterday.  Patient asks if ok not to get steroids before platelet transfusion as "I can't sleep if I got steroids".    Review of systems- Review of Systems  Constitutional: Positive for malaise/fatigue.  Eyes: Negative for pain and discharge.  Respiratory: Positive for shortness of breath. Negative for cough.   Cardiovascular: Negative for chest pain, palpitations and orthopnea.  Gastrointestinal: Negative for abdominal pain, nausea and vomiting.  Genitourinary: Negative for dysuria.  Musculoskeletal: Negative for falls.  Skin: Negative for rash.  Neurological: Negative for dizziness and sensory change.  Endo/Heme/Allergies: Bruises/bleeds easily.  Psychiatric/Behavioral: Negative for hallucinations.    Allergies  Allergen Reactions  . Codeine Anaphylaxis  . Fish-Derived Products Anaphylaxis  . Morphine Sulfate Other (See Comments)    REACTION: Hypotension  . Adhesive [Tape]     PAPER TAPE OK TO USE  . Oxycodone     Nausea and vomiting    Patient Active Problem List   Diagnosis Date Noted  . Elevated troponin   . Anemia   . Non-ST elevation (NSTEMI) myocardial infarction ([place])   . Chest pain 10/30/2017  . Cellulitis 10/17/2017  . Malnutrition of moderate degree  08/06/2017  . Vaginal bleeding 08/05/2017  . Tachycardia 07/30/2017  . Constipation 12/27/2016  . B12 deficiency 10/09/2016  . MDS (myelodysplastic syndrome) ([place]) 09/24/2016  . GI bleed 08/03/2016  . Thrombocytopenia ([place]) 08/03/2016  . 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 Persistent headaches 07/02/2016  . Mass of upper inner quadrant of left breast 06/20/2016  . Carcinoma of overlapping sites of right breast in female, estrogen receptor positive ([place]) 06/08/2016  . Chronic fatigue 06/08/2016  . Nutritional anemia, unspecified 06/08/2016  . Prediabetes 01/25/2016  . Tobacco abuse 11/06/2012  . History of breast cancer 11/06/2012  . Osteopenia 11/08/2011  . Hyperlipidemia 11/02/2010  . Essential hypertension 12/23/2006  . GERD 12/23/2006     Past Medical History:  Diagnosis Date  . Blood dyscrasia   . Breast cancer ([place])    right, lumpectomy, radiation, chemo  . Breast cancer ([place])    Right, 2007  . Breast cancer ([place]) 06/20/2016   INVASIVE DUCTAL CARCINOMA.  . Collapsed lung 02/14/2017   RIGHT  . Complication of anesthesia    PT STATED AFTER BIL HIP SURGERIES SHE STOPPED BREATHING THE NIGHT AFTER SURGERY  . COPD (chronic obstructive pulmonary disease) ([place])   . GERD (gastroesophageal reflux disease)   . Headache   . History of chemotherapy   . History of methicillin resistant staphylococcus aureus (MRSA) 2011  . History of radiation therapy   . Hyperlipidemia   . Hypertension   . Liver cancer ([place]) 05/2016  . Lung cancer ([place]) 05/2016  . Osteoarthritis    right hip  . Osteoporosis   . Squamous cell carcinoma    leg, Followed by Dr. Nicole Kindred     Past  Surgical History:  Procedure Laterality Date  . ABDOMINAL HYSTERECTOMY    . BREAST BIOPSY Left 2002   left breast, calcifications  . BREAST BIOPSY Left 06/20/2016   INVASIVE DUCTAL CARCINOMA.  Marland Kitchen BREAST EXCISIONAL BIOPSY Right 2007   positive  . bunion repair    . ESOPHAGOGASTRODUODENOSCOPY (EGD) WITH PROPOFOL N/A  08/05/2016   Procedure: ESOPHAGOGASTRODUODENOSCOPY (EGD) WITH PROPOFOL;  Surgeon: San Jetty, MD;  Location: [facility] ENDOSCOPY;  Service: General;  Laterality: N/A;  . IR FLUORO GUIDE CV LINE RIGHT  07/25/2017  . JOINT REPLACEMENT    . left breast biopsy    . NASAL SINUS SURGERY    . PORTACATH PLACEMENT Right 04/13/2017   Procedure: INSERTION PORT-A-CATH- RIGHT INTERNAL JUGULAR;  Surgeon: Robert Bellow, MD;  Location: [facility] ORS;  Service: General;  Laterality: Right;  . right hip replacement    . SHOULDER SURGERY    . SQUAMOUS CELL CARCINOMA EXCISION     right leg, Dr. Nicole Kindred  . TOTAL HIP ARTHROPLASTY     right    Social History   Socioeconomic History  . Marital status: Married    Spouse name: Not on file  . Number of children: Not on file  . Years of education: Not on file  . Highest education level: Not on file  Occupational History  . Not on file  Social Needs  . Financial resource strain: Not hard at all  . Food insecurity:    Worry: Never true    Inability: Never true  . Transportation needs:    Medical: No    Non-medical: No  Tobacco Use  . Smoking status: Current Some Day Smoker    Packs/day: 0.25    Years: 30.00    Pack years: 7.50    Types: Cigarettes  . Smokeless tobacco: Never Used  Substance and Sexual Activity  . Alcohol use: Not Currently    Alcohol/week: 0.0 standard drinks    Comment: socially  . Drug use: No  . Sexual activity: Not Currently  Lifestyle  . Physical activity:    Days per week: 4 days    Minutes per session: 30 min  . Stress: Not at all  Relationships  . Social connections:    Talks on phone: Three times a week    Gets together: Twice a week    Attends religious service: More than 4 times per year    Active member of club or organization: No    Attends meetings of clubs or organizations: Never    Relationship status: Married  . Intimate partner violence:    Fear of current or ex partner: No    Emotionally abused: No     Physically abused: No    Forced sexual activity: No  Other Topics Concern  . Not on file  Social History Narrative  . Not on file     Family History  Problem Relation Age of Onset  . Heart disease Father 8  . Heart disease Mother   . Hyperlipidemia Mother   . Heart disease Brother   . Diabetes Brother   . Pancreatic cancer Brother   . Diabetes Maternal Grandmother   . Breast cancer Cousin   . Kidney disease Brother      Current Facility-Administered Medications:  .  0.9 %  sodium chloride infusion (Manually program via Guardrails IV Fluids), 250 mL, Intravenous, Once, Earlie Server, MD .  acetaminophen (TYLENOL) tablet 650 mg, 650 mg, Oral, Q4H PRN, Arta Silence, MD, 650 mg at  10/31/17 1829 .  acyclovir (ZOVIRAX) 200 MG capsule 400 mg, 400 mg, Oral, BID, Jodell Cipro, Prasanna, MD, 400 mg at 11/03/17 1027 .  alum & mag hydroxide-simeth (MAALOX/MYLANTA) 200-200-20 MG/5ML suspension 15 mL, 15 mL, Oral, Q4H PRN, Jodell Cipro, Prasanna, MD .  antiseptic oral rinse (BIOTENE) solution 15 mL, 15 mL, Mouth Rinse, PRN, Mayo, Pete Pelt, MD .  bisacodyl (DULCOLAX) EC tablet 5 mg, 5 mg, Oral, Daily PRN, Arta Silence, MD .  diphenhydrAMINE (BENADRYL) capsule 25 mg, 25 mg, Oral, QHS PRN, Lance Coon, MD, 25 mg at 10/31/17 1829 .  famotidine (PEPCID) tablet 20 mg, 20 mg, Oral, BID PRN, Arta Silence, MD .  fluconazole (DIFLUCAN) tablet 100 mg, 100 mg, Oral, Daily, Epifanio Lesches, MD, 100 mg at 11/03/17 1027 .  fluticasone furoate-vilanterol (BREO ELLIPTA) 100-25 MCG/INH 1 puff, 1 puff, Inhalation, Daily, Harrie Foreman, MD, 1 puff at 11/03/17 1027 .  furosemide (LASIX) injection 20 mg, 20 mg, Intravenous, Q8H, Gollan, Kathlene November, MD, 20 mg at 11/03/17 1347 .  guaiFENesin (MUCINEX) 12 hr tablet 1,200 mg, 1,200 mg, Oral, BID PRN, Jodell Cipro, Prasanna, MD, 1,200 mg at 11/01/17 0424 .  heparin lock flush 100 unit/mL, 500 Units, Intracatheter, Daily PRN, Earlie Server, MD .  heparin  lock flush 100 unit/mL, 250 Units, Intracatheter, PRN, Earlie Server, MD .  ipratropium-albuterol (DUONEB) 0.5-2.5 (3) MG/3ML nebulizer solution 3 mL, 3 mL, Nebulization, Q6H, Epifanio Lesches, MD, 3 mL at 11/03/17 1359 .  isosorbide mononitrate (IMDUR) 24 hr tablet 30 mg, 30 mg, Oral, Daily, Theora Gianotti, NP, 30 mg at 11/03/17 1026 .  letrozole [facility]) tablet 2.5 mg, 2.5 mg, Oral, Daily, Jodell Cipro, Prasanna, MD, 2.5 mg at 11/03/17 1027 .  levofloxacin (LEVAQUIN) tablet 500 mg, 500 mg, Oral, Daily, Maccia, Melissa D, RPH, 500 mg at 11/03/17 1027 .  LORazepam (ATIVAN) tablet 0.5 mg, 0.5 mg, Oral, Q8H PRN, Pyreddy, Pavan, MD .  megestrol (MEGACE) 400 MG/10ML suspension 400 mg, 400 mg, Oral, BID, Jodell Cipro, Prasanna, MD, 400 mg at 11/03/17 1027 .  metoprolol succinate (TOPROL-XL) 24 hr tablet 50 mg, 50 mg, Oral, Daily, Arida, Muhammad A, MD, 50 mg at 11/03/17 1026 .  morphine 2 MG/ML injection 1-2 mg, 1-2 mg, Intravenous, Q3H PRN, Epifanio Lesches, MD, 2 mg at 11/02/17 1533 .  multivitamin with minerals tablet 1 tablet, 1 tablet, Oral, Daily, Epifanio Lesches, MD, 1 tablet at 11/03/17 1220 .  nicotine (NICODERM CQ - dosed in mg/24 hours) patch 21 mg, 21 mg, Transdermal, Daily, Jodell Cipro, Prasanna, MD, 21 mg at 11/03/17 1027 .  nitroGLYCERIN (NITROSTAT) SL tablet 0.4 mg, 0.4 mg, Sublingual, Q5 min PRN, Jodell Cipro, Prasanna, MD .  ondansetron (ZOFRAN) injection 4 mg, 4 mg, Intravenous, Q6H PRN, Jodell Cipro, Prasanna, MD .  pantoprazole (PROTONIX) EC tablet 40 mg, 40 mg, Oral, BID, Jodell Cipro, Prasanna, MD, 40 mg at 11/03/17 1027 .  pravastatin (PRAVACHOL) tablet 40 mg, 40 mg, Oral, Daily, Jodell Cipro, Prasanna, MD, 40 mg at 11/03/17 1027 .  promethazine (PHENERGAN) injection 12.5 mg, 12.5 mg, Intravenous, Q6H PRN, Jodell Cipro, Prasanna, MD .  senna-docusate (Senokot-S) tablet 1 tablet, 1 tablet, Oral, QHS PRN, Jodell Cipro, Prasanna, MD .  sucralfate (CARAFATE) 1 GM/10ML suspension 1 g, 1 g,  Oral, TID WC & HS, Epifanio Lesches, MD, 1 g at 11/03/17 1220 .  Tbo-Filgrastim (GRANIX) injection 480 mcg, 480 mcg, Subcutaneous, Daily, Charlaine Dalton R, MD, 480 mcg at 11/03/17 1046 .  vitamin C (ASCORBIC ACID) tablet 250 mg, 250 mg, Oral, BID, Epifanio Lesches, MD, 250 mg  at 11/03/17 1027  Facility-Administered Medications Ordered in Other Encounters:  .  0.9 %  sodium chloride infusion, , Intravenous, Continuous, [name], MD, Stopped at 06/28/17 1450 .  0.9 %  sodium chloride infusion, , Intravenous, Continuous, [name], MD, Stopped at 07/29/17 1515 .  heparin lock flush 100 unit/mL, 500 Units, Intravenous, Once, Brahmanday, Lenetta Quaker R, MD .  sodium chloride flush (NS) 0.9 % injection 10 mL, 10 mL, Intravenous, PRN, Charlaine Dalton R, MD .  sodium chloride flush (NS) 0.9 % injection 10 mL, 10 mL, Intravenous, PRN, Verlon Au, NP .  sodium chloride flush (NS) 0.9 % injection 10 mL, 10 mL, Intravenous, PRN, [name], MD, 10 mL at 07/29/17 1405   Physical exam:  Vitals:   11/03/17 0829 11/03/17 1356 11/03/17 1430 11/03/17 1440  BP: 125/71 (!) 145/85 120/71 123/70  Pulse: (!) 104 (!) 119 (!) 116 (!) 115  Resp: 18 (!) 22 19 19   Temp: 98 F (36.7 C) 98.5 F (36.9 C) 98.5 F (36.9 C) 98.5 F (36.9 C)  TempSrc: Oral Oral Oral Oral  SpO2: 97% 99% 98% 100%  Weight:      Height:       GENERAL: No distress, frail SKIN:  No rashes or significant lesions  HEAD: Normocephalic, No masses, lesions, tenderness or abnormalities  EYES:  non icteric ENT: External ears normal ,lips , buccal mucosa, and tongue normal and mucous membranes are moist  LYMPH: No palpable cervical and axillary lymphadenopathy  LUNGS: bibasilar diminished breath sounds  HEART: tachycardia ABDOMEN: Abdomen soft, non-tender, normal bowel sounds, I did not appreciate any  masses or organomegaly  MUSCULOSKELETAL: No CVA tenderness and no tenderness on percussion  of the back or rib cage.  EXTREMITIES: +1 edema,  NEURO: Alert & oriented,  Skin, no petechia. Bruising on bilateral upper extremities..      CMP Latest Ref Rng & Units 11/02/2017  Glucose 70 - 99 mg/dL 152(H)  BUN 8 - 23 mg/dL 18  Creatinine 0.44 - 1.00 mg/dL 0.78  Sodium 135 - 145 mmol/L 133(L)  Potassium 3.5 - 5.1 mmol/L 4.0  Chloride 98 - 111 mmol/L 93(L)  CO2 22 - 32 mmol/L 31  Calcium 8.9 - 10.3 mg/dL 8.4(L)  Total Protein 6.5 - 8.1 g/dL -  Total Bilirubin 0.3 - 1.2 mg/dL -  Alkaline Phos 38 - 126 U/L -  AST 15 - 41 U/L -  ALT 0 - 44 U/L -   CBC Latest Ref Rng & Units 11/03/2017  WBC 3.6 - 11.0 K/uL 2.1(L)  Hemoglobin 12.0 - 16.0 g/dL 8.5(L)  Hematocrit 35.0 - 47.0 % 24.3(L)  Platelets 150 - 440 K/uL 6(LL)    RADIOGRAPHIC STUDIES: I have personally reviewed the radiological images as listed and agreed with the findings in the report.  Dg Chest 2 View  Result Date: 10/29/2017 CLINICAL DATA:  Chest pain EXAM: CHEST - 2 VIEW COMPARISON:  07/21/2017, PET-CT 06/03/2017 FINDINGS: Right-sided central venous port tip over the proximal right atrium. Small bilateral pleural effusions and bibasilar airspace disease. Stable cardiomediastinal silhouette with aortic atherosclerosis. No pneumothorax. IMPRESSION: Small bilateral pleural effusions with bibasilar and right middle lobe airspace disease suspicious for pneumonia. Electronically Signed   By: Donavan Foil M.D.   On: 10/29/2017 21:23   Ct Head Wo Contrast  Result Date: 10/31/2017 CLINICAL DATA:  72 year old female with severe headache today. History of breast cancer. EXAM: CT HEAD WITHOUT CONTRAST TECHNIQUE: Contiguous axial images were obtained from  the base of the skull through the vertex without intravenous contrast. COMPARISON:  07/03/2016 brain MR FINDINGS: Brain: No evidence of acute infarction, hemorrhage, hydrocephalus, extra-axial collection or mass lesion/mass effect. Atrophy and mild chronic small-vessel white matter  ischemic changes again noted. Vascular: No hyperdense vessel or unexpected calcification. Skull: Normal. Negative for fracture or focal lesion. Sinuses/Orbits: No acute finding. Other: None. IMPRESSION: 1. No evidence of acute intracranial abnormality 2. Atrophy and mild chronic small-vessel white matter ischemic changes. Electronically Signed   By: Margarette Canada M.D.   On: 10/31/2017 13:30   Ct Angio Chest Pe W And/or Wo Contrast  Result Date: 10/29/2017 CLINICAL DATA:  Chest pain.  Malignancy history. EXAM: CT ANGIOGRAPHY CHEST WITH CONTRAST TECHNIQUE: Multidetector CT imaging of the chest was performed using the standard protocol during bolus administration of intravenous contrast. Multiplanar CT image reconstructions and MIPs were obtained to evaluate the vascular anatomy. CONTRAST:  77mL ISOVUE-370 IOPAMIDOL (ISOVUE-370) INJECTION 76% COMPARISON:  PET-CT 06/03/2017, chest CT 02/25/2017. Radiographs earlier this day. FINDINGS: Cardiovascular: There are no filling defects within the pulmonary arteries to suggest pulmonary embolus. Aortic atherosclerosis without aneurysm. No gross dissection allowing for phase of contrast. Mild right heart dilatation with contrast refluxing into the IVC. Small to moderate pericardial effusion measuring up to 14 mm in depth. Accessed right internal jugular port with tip in the distal SVC. Mediastinum/Nodes: Multiple small mediastinal nodes not enlarged by size criteria. Soft tissue density in the right hilum in the region of bronchus intermedius and origin of the right mainstem bronchus. No left hilar adenopathy. No axillary adenopathy. Esophagus is decompressed, partially obscured by adjacent pleural fluid. Right axillary surgical clips, no axillary adenopathy. Lungs/Pleura: Moderate right and small to moderate left pleural effusion. Adjacent compressive atelectasis in both lower lobes. Patchy ground-glass opacities with mild septal thickening in both lower lobes may represent  pulmonary edema. Near complete occlusion of the origin of the right middle lobe bronchus, unclear if this is secondary to bronchial filling or external compression, associated volume loss and patchy ground-glass opacities throughout the right middle lobe. There is improved aeration compared to 02/25/2017 CT but worsened since 06/03/2017 PET. Irregular left apical opacity has a linear morphology on axial imaging but spiculated nodular appearance on coronal reformats and measures approximately 1.5 x 1.8 x 1.3 cm, image 20 series 5. Pleural tail abuts the medial pleural. Small nodule in the anterior right upper lobe measuring approximately 8 x 3 mm is unchanged from prior exams, image 39 series 6. Mild upper lobe predominant emphysema. Upper Abdomen: No acute findings of the visualized upper abdomen. Musculoskeletal: No lytic or blastic osseous lesions. Scoliosis and degenerative change in the spine. Review of the MIP images confirms the above findings. IMPRESSION: 1. No pulmonary embolus. 2. Moderate right and small to moderate left pleural effusion. Patchy opacities with septal thickening in the lower lobes suspicious for pulmonary edema, in conjunction with contrast refluxing into the IVC. Findings suggesting mild congestive heart failure. 3. Small to moderate pericardial effusion. 4. Irregular left apical nodular opacity measuring up to 1.8 cm, new from prior PET. Given history of malignancy, this is nonspecific for infectious/inflammatory process versus metastasis. Consider repeat PET characterization versus follow-up CT in 3 months. 5. Recurrent partial collapse of the right middle lobe with near complete occlusion at the origin of the right middle lobe bronchus, unclear if this is due to intraluminal filling or external compression. Collapse was not present on most recent PET imaging but was seen on chest CT  8 months prior, suggesting this is intermittent. Aortic Atherosclerosis (ICD10-I70.0) and Emphysema  (ICD10-J43.9). Electronically Signed   By: Jeb Levering M.D.   On: 10/29/2017 23:22   US Venous Img Lower Bilateral  Result Date: 10/17/2017 CLINICAL DATA:  72 year old female with bilateral lower extremity edema for the past 2 months EXAM: BILATERAL LOWER EXTREMITY VENOUS DOPPLER ULTRASOUND TECHNIQUE: Gray-scale sonography with graded compression, as well as color Doppler and duplex ultrasound were performed to evaluate the lower extremity deep venous systems from the level of the common femoral vein and including the common femoral, femoral, profunda femoral, popliteal and calf veins including the posterior tibial, peroneal and gastrocnemius veins when visible. The superficial great saphenous vein was also interrogated. Spectral Doppler was utilized to evaluate flow at rest and with distal augmentation maneuvers in the common femoral, femoral and popliteal veins. COMPARISON:  None. FINDINGS: RIGHT LOWER EXTREMITY Common Femoral Vein: No evidence of thrombus. Normal compressibility, respiratory phasicity and response to augmentation. Increased pulsatility of the venous waveform. Saphenofemoral Junction: No evidence of thrombus. Normal compressibility and flow on color Doppler imaging. Profunda Femoral Vein: No evidence of thrombus. Normal compressibility and flow on color Doppler imaging. Femoral Vein: No evidence of thrombus. Normal compressibility, respiratory phasicity and response to augmentation. Popliteal Vein: No evidence of thrombus. Normal compressibility, respiratory phasicity and response to augmentation. Calf Veins: No evidence of thrombus. Normal compressibility and flow on color Doppler imaging. Superficial Great Saphenous Vein: No evidence of thrombus. Normal compressibility. Venous Reflux:  None. Other Findings:  None. LEFT LOWER EXTREMITY Common Femoral Vein: No evidence of thrombus. Normal compressibility, respiratory phasicity and response to augmentation. Increased pulsatility of the  venous waveform. Saphenofemoral Junction: No evidence of thrombus. Normal compressibility and flow on color Doppler imaging. Profunda Femoral Vein: No evidence of thrombus. Normal compressibility and flow on color Doppler imaging. Femoral Vein: No evidence of thrombus. Normal compressibility, respiratory phasicity and response to augmentation. Popliteal Vein: No evidence of thrombus. Normal compressibility, respiratory phasicity and response to augmentation. Calf Veins: No evidence of thrombus. Normal compressibility and flow on color Doppler imaging. Superficial Great Saphenous Vein: No evidence of thrombus. Normal compressibility. Venous Reflux:  None. Other Findings:  None. IMPRESSION: 1. No evidence of deep venous thrombosis in either lower extremity. 2. Increased pulsatility of the venous waveforms as can be seen in the setting of elevated right heart pressures. Differential considerations include tricuspid regurgitation, right-sided heart dysfunction/failure, pulmonary arterial hypertension and chronic COPD. Electronically Signed   By: Jacqulynn Cadet M.D.   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   Transfuse 1 unit of cross matched/washed platelet. Discussed with blood bank. Check post transfusion platelet.  Hold steroid as pre med. Give benadryl and tylenol prior to platelet transfusion.   Anemia, hemoglobin 8.5 today, transfuse if Hb<8.  Neutropenia, ANC 1, continue daily Granix.   # Acute CHF/NSTEMI not candidate for any antiplatelet or antithrombotic therapy. Continue beta blocker, nitrate, Statin.  Morphine PRN for chest pain. On Lasix Q8 hours treatment. Discussed with Dr.Gollan.    # Stage IV breast cancer, stable. Total face to face encounter time for this patient visit was 83min. >50% of the time was  spent in counseling and coordination of care    Earlie Server, MD, PhD Hematology [place] [facility] Pager- [phone] 11/03/2017

## 2017-11-03 NOTE — Plan of Care (Signed)
Patient has periods of chest tightness while at rest be denies any pain. Daughter is at there bedside. Patient will not receive platelets until after 1pm 8/25 per blood bank representative.

## 2017-11-03 NOTE — Progress Notes (Signed)
[facility] NAME: Shelly Arias    MR#:  [id number]  DATE OF BIRTH:  December 19, 1945  SUBJECTIVE: Admitted because of chest pain, non-ST elevation MI, acute on chronic systolic heart failure.  Patient has history of MDS, bone marrow failure, gets recurrent blood transfusion, platelet transfusion, received 1 unit of packed RBC transfusion yesterday, after that patient had trouble breathing, wheezing, could not rest last night.  Today she is very weak, has mild chest pain, shortness of breath.  CHIEF COMPLAINT:   Chief Complaint  Patient presents with  . Chest Pain  Patient seen and evaluated today No spontaneous bleeding No shortness of breath No complaints of any chest pain Tolerating diet ok Platelet transfusion today  REVIEW OF SYSTEMS:   ROS CONSTITUTIONAL: No fever, fatigue, appears pale, appears chronically ill.  Cachectic.  EYES: No blurred or double vision.  EARS, NOSE, AND THROAT: No tinnitus or ear pain.  RESPIRATORY:  shortness of breath, mild chest pain CARDIOVASCULAR: Has some chest pain on and off.   GASTROINTESTINAL: No nausea, vomiting, diarrhea or abdominal pain.  GENITOURINARY: No dysuria, hematuria.  ENDOCRINE: No polyuria, nocturia,  HEMATOLOGY: No anemia, easy bruising or bleeding SKIN: No rash or lesion. MUSCULOSKELETAL: Leg edema.  Marland Kitchen  NEUROLOGIC: No tingling, numbness, weakness.  PSYCHIATRY: No anxiety or depression.   DRUG ALLERGIES:   Allergies  Allergen Reactions  . Codeine Anaphylaxis  . Fish-Derived Products Anaphylaxis  . Morphine Sulfate Other (See Comments)    REACTION: Hypotension  . Adhesive [Tape]     PAPER TAPE OK TO USE  . Oxycodone     Nausea and vomiting    VITALS:  Blood pressure 125/71, pulse (!) 104, temperature 98 F (36.7 C), temperature source Oral, resp. rate 18, height 5\' 1"  (1.549 m), weight 59 kg, SpO2 97 %.  PHYSICAL EXAMINATION:  GENERAL:  72 y.o.-year-old patient  lying in the bed with no acute distress.  EYES: Pupils equal, round, reactive to light and accommodation. No scleral icterus. Extraocular muscles intact.  HEENT: Head atraumatic, normocephalic. Oropharynx and nasopharynx clear.  NECK:  Supple, no jugular venous distention. No thyroid enlargement, no tenderness.  LUNGS: Normal breath sounds bilaterally, no wheezing, rales,rhonchi or crepitation. No use of accessory muscles of respiration.  CARDIOVASCULAR: S1, S2 normal. No murmurs, rubs, or gallops.  ABDOMEN: Soft, nontender, nondistended. Bowel sounds present. No organomegaly or mass.  EXTREMITIES: Bilateral leg edema 1+. NEUROLOGIC: Cranial nerves II through XII are intact. Muscle strength 5/5 in all extremities. Sensation intact. Gait not checked.  PSYCHIATRIC: The patient is alert and oriented x 3.  SKIN: No obvious rash, lesion, or ulcer.    LABORATORY PANEL:   CBC Recent Labs  Lab 11/03/17 0528  WBC 2.1*  HGB 8.5*  HCT 24.3*  PLT 6*   ------------------------------------------------------------------------------------------------------------------  Chemistries  Recent Labs  Lab 10/29/17 2106  11/02/17 0414  NA 135   < > 133*  K 4.1   < > 4.0  CL 100   < > 93*  CO2 26   < > 31  GLUCOSE 191*   < > 152*  BUN 27*   < > 18  CREATININE 1.11*   < > 0.78  CALCIUM 8.9   < > 8.4*  MG 1.7  --   --   AST 104*  --   --   ALT 100*  --   --   ALKPHOS 96  --   --  BILITOT 0.9  --   --    < > = values in this interval not displayed.   ------------------------------------------------------------------------------------------------------------------  Cardiac Enzymes Recent Labs  Lab 10/30/17 0753  TROPONINI 5.48*   ------------------------------------------------------------------------------------------------------------------  RADIOLOGY:  No results found.  EKG:   Orders placed or performed during the hospital encounter of 10/29/17  . ED EKG within 10 minutes  . ED  EKG within 10 minutes  . EKG 12-Lead  . EKG 12-Lead  . EKG 12-Lead  . EKG 12-Lead  . EKG 12-Lead    ASSESSMENT AND PLAN:   72 year old female patient with underlying metastatic right breast cancer, myelodysplastic syndrome with marrow failure gets recurrent blood transfusions and platelet transfusion every week at cancer center comes in with chest pain and found to have elevated troponins and admitted to telemetry.  1. Acute chest pain secondary to non-ST elevation MI, patient is not candidate for aggressive intervention secondary to her underlying myelodysplastic syndrome.  Continue conservative treatment with beta-blockers, ACE inhibitors, nitrates. Status post cardiology follow-up Because of persistent tachycardia beta-blockers are started.  Continue statins and nitrates. Not a candidate for Lovenox and heparin  2.  Acute on chronic systolic heart failure with EF 25%, patient elevated BNP.  patient is on low-dose Lasix IV.  Patient is on Toprol XL 50 mg daily to help with tachycardia.  Likely discharge her on low-dose ACE inhibitors/ARB as far as the blood pressure improves and stable on Toprol, Lasix once her blood count improves.  3. Severe thrombocytopenia Platelet transfusions today  4.  Idiopathic bone marrow my failure, stage IV breast cancer Recurrent anemia, thrombocytopenia, gets irradiated blood, platelets every week at cancer center, status post 1 unit of packed RBC transfusion yesterday after that shortness of breath got worse and today she is weaker.  And platelets again dropped to 12 today.  Overall prognosis really poor with her acute MI, multiple other medical problems of MDS, pulmonary failure, heart failure Platelet transfusion today Patient is DNR  5.  Severe malnutrition in the context of chronic illness.  Seen by dietitian, patient continue Ensure supplements.  6.  Chronic infection prophylaxis with acyclovir, Levaquin.  7.Prognosis extremely poor, high risk for  cardiac arrest in view of multiple medical problems   All the records are reviewed and case discussed with Care Management/Social Workerr. Management plans discussed with the patient, family and they are in agreement.  CODE STATUS: DNR  TOTAL TIME TAKING CARE OF THIS PATIENT: 33 minutes.   More than 50% time spent in counseling, coordination of care, discussed with multiple family members especially the son, daughter at bedside.  Spoke with Dr.Yu  from oncology.    POSSIBLE D/C IN 2 to 3 DAYS, DEPENDING ON CLINICAL CONDITION.   Saundra Shelling M.D on 11/03/2017 at 9:57 AM  Between 7am to 6pm - Pager - [phone]  After 6pm go to [email or web address] - password [email or web address] [facility]  [facility]  Office  [phone]  CC: Primary care physician; [name], MD   Note: This dictation was prepared with Dragon dictation along with smaller phrase technology. Any transcriptional errors that result from this process are unintentional.

## 2017-11-03 NOTE — Progress Notes (Signed)
Pt stated during 2000 hr tx she did not want to be awakened for her 0200 hr tx with family present. I advised pt and family to call if anything changed prior to 0800 hr tx.

## 2017-11-03 NOTE — Progress Notes (Signed)
Progress Note  Patient Name: Shelly Arias Date of Encounter: 11/03/2017  Primary Cardiologist: Nelva Bush, MD  Subjective   Shortness of breath on waking up Saturday morning Lasix increased up to every 8 hours 1.5 L negative in the past 24 hours Improvement in her shortness of breath this morning and was sleeping last night Family noticing she has some cough CT scan reviewed showing moderate pleural effusions on August 20 Still on nasal cannula oxygen in bed  Inpatient Medications    Scheduled Meds: . sodium chloride  250 mL Intravenous Once  . acyclovir  400 mg Oral BID  . fluconazole  100 mg Oral Daily  . fluticasone furoate-vilanterol  1 puff Inhalation Daily  . furosemide  20 mg Intravenous Q8H  . ipratropium-albuterol  3 mL Nebulization Q6H  . isosorbide mononitrate  30 mg Oral Daily  . letrozole  2.5 mg Oral Daily  . levofloxacin  500 mg Oral Daily  . megestrol  400 mg Oral BID  . metoprolol succinate  50 mg Oral Daily  . multivitamin with minerals  1 tablet Oral Daily  . nicotine  21 mg Transdermal Daily  . pantoprazole  40 mg Oral BID  . pravastatin  40 mg Oral Daily  . sucralfate  1 g Oral TID WC & HS  . Tbo-filgastrim (GRANIX) SQ  480 mcg Subcutaneous Daily  . vitamin C  250 mg Oral BID   Continuous Infusions:  PRN Meds: acetaminophen, alum & mag hydroxide-simeth, antiseptic oral rinse, bisacodyl, diphenhydrAMINE, famotidine, guaiFENesin, heparin lock flush, heparin lock flush, LORazepam, morphine injection, nitroGLYCERIN, ondansetron (ZOFRAN) IV, promethazine, senna-docusate   Vital Signs    Vitals:   11/03/17 1356 11/03/17 1430 11/03/17 1440 11/03/17 1517  BP: (!) 145/85 120/71 123/70 116/76  Pulse: (!) 119 (!) 116 (!) 115   Resp: (!) 22 19 19 18   Temp: 98.5 F (36.9 C) 98.5 F (36.9 C) 98.5 F (36.9 C) 98.3 F (36.8 C)  TempSrc: Oral Oral Oral Oral  SpO2: 99% 98% 100% 98%  Weight:      Height:        Intake/Output Summary (Last  24 hours) at 11/03/2017 1548 Last data filed at 11/03/2017 1440 Gross per 24 hour  Intake 184 ml  Output 1000 ml  Net -816 ml   Filed Weights   10/30/17 1347 11/01/17 0359 11/03/17 0352  Weight: 60.4 kg 59.6 kg 59 kg    Physical Exam   Constitutional:  oriented to person, place, and time. Frail, shortness of breath on nasal cannula oxygen HENT:  Head: Normocephalic and atraumatic.  Eyes:  no discharge. No scleral icterus.  Neck: Normal range of motion. Neck supple. No JVD present.  Cardiovascular: Normal rate, regular rhythm, normal heart sounds and intact distal pulses. Exam reveals no gallop and no friction rub. No edema No murmur heard. Pulmonary/Chest: poor inspiratory effort, dullness at the bases Abdominal: Soft.  no distension.  no tenderness.  Musculoskeletal: Normal range of motion.  no  tenderness or deformity.  Neurological:  normal muscle tone. Coordination normal. No atrophy Skin: Skin is warm and dry. No rash noted. not diaphoretic.  Psychiatric:  normal mood and affect. behavior is normal. Thought content normal.    Labs    Chemistry Recent Labs  Lab 10/29/17 2106 11/01/17 1322 11/02/17 0414  NA 135 132* 133*  K 4.1 3.1* 4.0  CL 100 94* 93*  CO2 26 30 31   GLUCOSE 191* 158* 152*  BUN 27* 22 18  CREATININE 1.11* 0.78 0.78  CALCIUM 8.9 8.6* 8.4*  PROT 5.4*  --   --   ALBUMIN 2.8*  --   --   AST 104*  --   --   ALT 100*  --   --   ALKPHOS 96  --   --   BILITOT 0.9  --   --   GFRNONAA 49* >60 >60  GFRAA 57* >60 >60  ANIONGAP 9 8 9      Hematology Recent Labs  Lab 11/01/17 1001 11/02/17 0414 11/03/17 0528  WBC 1.8* 2.1* 2.1*  RBC 3.17* 3.01* 2.85*  HGB 9.4* 9.0* 8.5*  HCT 27.1* 25.7* 24.3*  MCV 85.5 85.4 85.3  MCH 29.6 30.0 29.9  MCHC 34.6 35.1 35.1  RDW 13.5 13.7 13.7  PLT 21* 12* 6*    Cardiac Enzymes Recent Labs  Lab 10/29/17 2106 10/30/17 0330 10/30/17 0753  TROPONINI 0.73*  0.77* 3.26* 5.48*    BNP Recent Labs  Lab  10/30/17 0753  BNP 2,675.0*      Radiology    No results found.  Telemetry    Sinus tachycardia - 100 - 120's - Personally Reviewed  Cardiac Studies   2D Echocardiogram 8.9.2019  Left ventricle: The cavity size was normal. Wall thickness was   normal. Systolic function was severely reduced. The estimated   ejection fraction was in the range of 20% to 25%. Diffuse   hypokinesis. The study is not technically sufficient to allow   evaluation of LV diastolic function. - Mitral valve: Calcified annulus. There was mild regurgitation. - Pulmonary arteries: Systolic pressure could not be accurately   estimated. - Inferior vena cava: The vessel was moderately dilated. The   respirophasic diameter changes were blunted (< 50%), consistent   with elevated central venous pressure. - Pericardium, extracardiac: A small pericardial effusion was   identified. There was no evidence of hemodynamic compromise. _____________   Patient Profile     72 y.o. 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  She presented with shortness of breath and was found to have non-ST elevation myocardial infarction.   Assessment & Plan    1.  NSTEMI:   Trop peaked @ 5.48 on 8/21   not a candidate for cardiac catheterization at this time On beta blockers, nitrates, statin Limited ability to advance her medications given hypotension Still with tachycardia and shortness of breath, anemia  2.  Acute on chronic systolic CHF:   EF 94-49% by echo 8/9.   Lasix up to 20 mg IV every 8 hours Family would like to continue every 8 hours 1 more day with repeat basic metabolic panel tomorrow morning Woke up this morning with less panic/shortness of breath Likely still with residual pleural effusions contributing to  cough Symptoms exacerbated by tachycardia and anemia  3.  bone marrow failure w/ h/o stage IV breast cancer:   Followed by oncology.   S/p 1u prbc Scheduled for platelets,  Oncology to transfuse for hemoglobin less than 8   High risk of DVT, recommend SCDS Not a good candidate for Lovenox or subcutaneous heparin  Long discussion with family above concerning cardiac issues Case also discussed with Dr. Tasia Catchings, oncology  Total encounter time more than 35 minutes  Greater than 50% was spent in counseling and coordination of care with the patient   Signed, Christia Reading  Rockey Situ, MD  11/03/2017, 3:48 PM    For questions or updates, please contact   Please consult [email or web address] for contact info under Cardiology/STEMI.

## 2017-11-04 ENCOUNTER — Inpatient Hospital Stay: Payer: Medicare Other

## 2017-11-04 DIAGNOSIS — D469 Myelodysplastic syndrome, unspecified: Secondary | ICD-10-CM | POA: Diagnosis not present

## 2017-11-04 DIAGNOSIS — I5021 Acute systolic (congestive) heart failure: Secondary | ICD-10-CM

## 2017-11-04 DIAGNOSIS — D704 Cyclic neutropenia: Secondary | ICD-10-CM | POA: Diagnosis not present

## 2017-11-04 LAB — BASIC METABOLIC PANEL
Anion gap: 10 (ref 5–15)
BUN: 15 mg/dL (ref 8–23)
CALCIUM: 8.4 mg/dL — AB (ref 8.9–10.3)
CO2: 32 mmol/L (ref 22–32)
Chloride: 87 mmol/L — ABNORMAL LOW (ref 98–111)
Creatinine, Ser: 0.72 mg/dL (ref 0.44–1.00)
GFR calc non Af Amer: >60 mL/min (ref >60)
GLUCOSE: 133 mg/dL — AB (ref 70–99)
Potassium: 3.4 mmol/L — ABNORMAL LOW (ref 3.5–5.1)
Sodium: 129 mmol/L — ABNORMAL LOW (ref 135–145)

## 2017-11-04 LAB — CBC WITH DIFFERENTIAL/PLATELET
BASOS ABS: 0 10*3/uL (ref 0–0.1)
BLASTS: 0 %
Band Neutrophils: 3 %
Basophils Relative: 0 %
EOS PCT: 0 %
Eosinophils Absolute: 0 10*3/uL (ref 0–0.7)
HCT: 24.4 % — ABNORMAL LOW (ref 35.0–47.0)
HEMOGLOBIN: 8.6 g/dL — AB (ref 12.0–16.0)
LYMPHS ABS: 0.8 10*3/uL — AB (ref 1.0–3.6)
Lymphocytes Relative: 50 %
MCH: 29.8 pg (ref 26.0–34.0)
MCHC: 35.1 g/dL (ref 32.0–36.0)
MCV: 85 fL (ref 80.0–100.0)
METAMYELOCYTES PCT: 0 %
Monocytes Absolute: 0 10*3/uL — ABNORMAL LOW (ref 0.2–0.9)
Monocytes Relative: 2 %
Myelocytes: 2 %
Neutro Abs: 0.8 10*3/uL — ABNORMAL LOW (ref 1.4–6.5)
Neutrophils Relative %: 43 %
Other: 0 %
PLATELETS: 19 10*3/uL — AB (ref 150–440)
PROMYELOCYTES RELATIVE: 0 %
RBC: 2.87 MIL/uL — AB (ref 3.80–5.20)
RDW: 13.7 % (ref 11.5–14.5)
WBC: 1.6 10*3/uL — AB (ref 3.6–11.0)
nRBC: 0 /100 WBC

## 2017-11-04 LAB — PREPARE PLATELET PHERESIS: Unit division: 0

## 2017-11-04 LAB — MAGNESIUM: Magnesium: 1.5 mg/dL — ABNORMAL LOW (ref 1.7–2.4)

## 2017-11-04 LAB — BPAM PLATELET PHERESIS
BLOOD PRODUCT EXPIRATION DATE: 201908251430
ISSUE DATE / TIME: 201908251405
Unit Type and Rh: 5100

## 2017-11-04 LAB — PHOSPHORUS: Phosphorus: 3.7 mg/dL (ref 2.5–4.6)

## 2017-11-04 MED ORDER — FUROSEMIDE 40 MG PO TABS
40.0000 mg | ORAL_TABLET | Freq: Every day | ORAL | Status: DC
  Administered 2017-11-04 – 2017-11-06 (×3): 40 mg via ORAL
  Filled 2017-11-04 (×4): qty 1

## 2017-11-04 MED ORDER — LORAZEPAM 2 MG/ML IJ SOLN
0.5000 mg | Freq: Once | INTRAMUSCULAR | Status: AC
  Administered 2017-11-04: 0.5 mg via INTRAVENOUS
  Filled 2017-11-04: qty 1

## 2017-11-04 MED ORDER — LOSARTAN POTASSIUM 25 MG PO TABS
25.0000 mg | ORAL_TABLET | Freq: Every day | ORAL | Status: DC
  Administered 2017-11-04 – 2017-11-05 (×2): 25 mg via ORAL
  Filled 2017-11-04 (×2): qty 1

## 2017-11-04 MED ORDER — POTASSIUM CHLORIDE CRYS ER 20 MEQ PO TBCR
40.0000 meq | EXTENDED_RELEASE_TABLET | Freq: Once | ORAL | Status: AC
  Administered 2017-11-04: 40 meq via ORAL
  Filled 2017-11-04: qty 2

## 2017-11-04 MED ORDER — LORAZEPAM 0.5 MG PO TABS
0.5000 mg | ORAL_TABLET | Freq: Four times a day (QID) | ORAL | Status: DC | PRN
  Administered 2017-11-04: 1 mg via ORAL
  Filled 2017-11-04: qty 2

## 2017-11-04 MED ORDER — MAGNESIUM SULFATE 2 GM/50ML IV SOLN
2.0000 g | Freq: Once | INTRAVENOUS | Status: AC
  Administered 2017-11-04: 2 g via INTRAVENOUS
  Filled 2017-11-04: qty 50

## 2017-11-04 NOTE — Care Management Important Message (Signed)
Important Message  Patient Details  Name: Shelly Arias MRN: [id number] Date of Birth: [birth year]   Medicare Important Message Given:  Yes    Juliann Pulse A Notnamed Croucher 11/04/2017, 11:06 AM

## 2017-11-04 NOTE — Progress Notes (Signed)
Shelly Arias   DOB:[birth year]   JK#:[id number]    Subjective: Patient continues to have difficulty breathing especially at nighttime when resting.  She feels tired.  Poor appetite.  Continues to have leg swelling.  Continues to have wheezing.   Objective:  Vitals:   11/04/17 0724 11/04/17 0900  BP:  126/78  Pulse:  (!) 110  Resp:  20  Temp:  98 F (36.7 C)  SpO2: 100% 100%     Intake/Output Summary (Last 24 hours) at 11/04/2017 1644 Last data filed at 11/04/2017 1434 Gross per 24 hour  Intake -  Output 1040 ml  Net -1040 ml   GENERAL: Thin built moderately poorly nourished female patient alert, no distress and comfortable.  Accompanied by husband/son.  On O2 2Lit/min EYES: Positive for pallor. OROPHARYNX: no thrush or ulceration. NECK: supple, no masses felt LYMPH:  no palpable lymphadenopathy in the cervical, axillary or inguinal regions LUNGS: decreased breath sounds to auscultation at bases.  Positive for wheezing on the right side. HEART/CVS: Tachycardic ; regular rhythm and no murmurs; positive for leg swelling bilateral.  ABDOMEN: abdomen soft, non-tender and normal bowel sounds Musculoskeletal:no cyanosis of digits and no clubbing  PSYCH: alert & oriented x 3 with fluent speech NEURO: no focal motor/sensory deficits SKIN: Multiple ecchymosis.   Labs:  Lab Results  Component Value Date   WBC 1.6 (L) 11/04/2017   HGB 8.6 (L) 11/04/2017   HCT 24.4 (L) 11/04/2017   MCV 85.0 11/04/2017   PLT 19 (LL) 11/04/2017   NEUTROABS 0.8 (L) 11/04/2017    Lab Results  Component Value Date   NA 129 (L) 11/04/2017   K 3.4 (L) 11/04/2017   CL 87 (L) 11/04/2017   CO2 32 11/04/2017    Studies:  Dg Chest Port 1 View  Result Date: 11/04/2017 CLINICAL DATA:  Acute systolic (congestive) heart failure Weakness / h/o CA , sob, cough , congestion EXAM: PORTABLE CHEST 1 VIEW COMPARISON:  10/29/2017 FINDINGS: The patient has a RIGHT-sided PowerPort, tip overlying the level of the  RIGHT atrium. Heart size is normal. There are small bilateral pleural effusions. Bibasilar opacities are consistent with atelectasis, RIGHT greater than LEFT. No pulmonary edema. Surgical clips in the region of the RIGHT axilla. There are moderate degenerative changes in the thoracolumbar spine. IMPRESSION: Bilateral pleural effusions and bibasilar opacities, likely related atelectasis. Electronically Signed   By: Nolon Nations M.D.   On: 11/04/2017 09:00    Assessment & Plan:   ASSESSMENT & PLAN:   # 72 year old female patient with a history of undiagnosed bone marrow failure syndrome; metastatic breast cancer-currently admitted the hospital for worsening shortness of breath chest pain/non-STEMI with acute CHF  # "Bone marrow failure syndrome"-status post multiple evaluations-without any clear diagnosis.  Platelets/PRBC transfusion dependent.  Status post PRBC transfusion hemoglobin 8.9; platelets status post PRBC transfusion on 8/25-19,000 today.  #Acute CHF/non-STEMI-patient not a candidate for any antiplatelet/antithrombotic therapy.  Continue with Nitropatch; morphine for chest pain/air hunger.  Lasix IV 20 mg 3 times a day-change to p.o.  # Metastatic breast cancer-ER PR positive letrozole-stable.  # DNR/DNI;I had a long discussion with the patient/husband and son regarding-discharge planning/ "long term plans".  Family feels patient has been suffering/poor quality of life.  Patient/family understands that given the absence of any further treatment options/significant clinical improvement-hospice is reasonable.  However, patient/family understand that blood transfusion/platelet transfusion would be restricted in hospice-which would cause her demise.  Discussed with palliative care nurse.  Discussed with cardiology;  and Dr. Estanislado Pandy.   Addendum: As per the palliative care nurse, Lexine Baton discussion the family-interested to be discharged to [place] home; and discontinue blood/platelet  transfusions.  Awaiting for bed at this time.  # 40 minutes face-to-face with the patient discussing the above plan of care; more than 50% of time spent on prognosis/ natural history; counseling and coordination.   [name], MD 11/04/2017  4:44 PM

## 2017-11-04 NOTE — Progress Notes (Addendum)
Nutrition Follow Up Note   DOCUMENTATION CODES:   Non-severe (moderate) malnutrition in context of chronic illness  INTERVENTION:   Boost Plus BID- Each supplement provides 360kcal and 14g protein. (provided by family)  MVI daily  Vitamin C 243m po BID  NUTRITION DIAGNOSIS:   Moderate Malnutrition related to cancer and cancer related treatments as evidenced by moderate fat depletion, moderate muscle depletion.  GOAL:   Patient will meet greater than or equal to 90% of their needs  -not met   MONITOR:   PO intake, Supplement acceptance, Labs, Weight trends, Skin, I & O's  ASSESSMENT:   72y.o. female on 10/29/2017 from home with chest pain. She has a past medical history of metastatic right breast cancer ER/PR positive (s/p surgery and chemoradiation), pancytopenia, MDS with marrow failure, osteoporosis, hypertension, hyperlipidemia, GERD, and COPD.    Pt with improved appetite and oral intake; pt eating anywhere from sips and bites to 75% of meals. Pt drinking Boost Plus and eating snacks brought in from home. Pt on megace. Per chart, pt is weight stable since admit. Continue vitamins and supplements. Pt likely at refeeding risk; recommend check Mg and P. RD will monitor for GOC.    Medications reviewed and include: Lasix, megace, MVI, nicotine, protonix, carafate, vitamin C  Labs reviewed: Na 129(L), K 3.4(L), Cl 87(L) Wbc- 1.6(L), Hgb 8.6(L), Hct 24.4(L)  Diet Order:   Diet Order            Diet regular Room service appropriate? Yes; Fluid consistency: Thin  Diet effective now             EDUCATION NEEDS:   Education needs have been addressed  Skin:  Skin Assessment: Reviewed RN Assessment(ecchymosis )  Last BM:  8/25- type 4  Height:   Ht Readings from Last 1 Encounters:  10/30/17 _0  (1.549 m)    Weight:   Wt Readings from Last 1 Encounters:  11/04/17 58.4 kg    Ideal Body Weight:  47.7 kg  BMI:  Body mass index is 24.32 kg/m.  Estimated  Nutritional Needs:   Kcal:  1400-1600kcal/day   Protein:  67-80g/day   Fluid:  >1.4L/day   CKoleen DistanceMS, RD, LDN Pager #- [phone]#- [phone] Hours Pager: [phone]

## 2017-11-04 NOTE — Plan of Care (Signed)
Patient and family have decided to utilize comfort measures. Patient has refused most of her meds will only request medications to make her comfortable. Family is at the bedside helping with care. Patient wants to rest and get some sleep. Will limit any unnecessary disturbances.

## 2017-11-04 NOTE — Progress Notes (Signed)
Progress Note  Patient Name: Shelly Arias Date of Encounter: 11/04/2017  Primary Cardiologist: Nelva Bush, MD  Subjective   She feels that shortness of breath has improved since yesterday.  She is -4.3 L for the admission and her weight went down 2 kg total.  Inpatient Medications    Scheduled Meds: . sodium chloride  250 mL Intravenous Once  . acyclovir  400 mg Oral BID  . fluconazole  100 mg Oral Daily  . fluticasone furoate-vilanterol  1 puff Inhalation Daily  . furosemide  40 mg Oral Daily  . ipratropium-albuterol  3 mL Nebulization Q6H  . isosorbide mononitrate  30 mg Oral Daily  . letrozole  2.5 mg Oral Daily  . levofloxacin  500 mg Oral Daily  . losartan  25 mg Oral Daily  . megestrol  400 mg Oral BID  . metoprolol succinate  50 mg Oral Daily  . multivitamin with minerals  1 tablet Oral Daily  . nicotine  21 mg Transdermal Daily  . pantoprazole  40 mg Oral BID  . potassium chloride  40 mEq Oral Once  . pravastatin  40 mg Oral Daily  . sodium chloride flush  10-40 mL Intracatheter Q12H  . sodium chloride flush  3 mL Intravenous Q12H  . sucralfate  1 g Oral TID WC & HS  . Tbo-filgastrim (GRANIX) SQ  480 mcg Subcutaneous Daily  . vitamin C  250 mg Oral BID   Continuous Infusions:  PRN Meds: acetaminophen, alum & mag hydroxide-simeth, antiseptic oral rinse, bisacodyl, diphenhydrAMINE, famotidine, guaiFENesin, heparin lock flush, heparin lock flush, LORazepam, morphine injection, nitroGLYCERIN, ondansetron (ZOFRAN) IV, promethazine, senna-docusate, sodium chloride flush   Vital Signs    Vitals:   11/03/17 1910 11/03/17 2000 11/04/17 0401 11/04/17 0724  BP: 114/70  126/78   Pulse:   (!) 111   Resp: 18  18   Temp: 98.7 F (37.1 C)  97.8 F (36.6 C)   TempSrc: Oral  Oral   SpO2: 100% 100% 98% 100%  Weight:   58.4 kg   Height:        Intake/Output Summary (Last 24 hours) at 11/04/2017 0844 Last data filed at 11/04/2017 0554 Gross per 24 hour    Intake 184 ml  Output 940 ml  Net -756 ml   Filed Weights   11/01/17 0359 11/03/17 0352 11/04/17 0401  Weight: 59.6 kg 59 kg 58.4 kg    Physical Exam   Constitutional:  oriented to person, place, and time. Frail, shortness of breath on nasal cannula oxygen HENT:  Head: Normocephalic and atraumatic.  Eyes:  no discharge. No scleral icterus.  Neck: Normal range of motion. Neck supple. No JVD present.  Cardiovascular: Normal rate, regular rhythm, normal heart sounds and intact distal pulses. Exam reveals no gallop and no friction rub. No edema No murmur heard. Pulmonary/Chest: poor inspiratory effort, dullness at the bases Abdominal: Soft.  no distension.  no tenderness.  Musculoskeletal: Normal range of motion.  no  tenderness or deformity.  Neurological:  normal muscle tone. Coordination normal. No atrophy Skin: Skin is warm and dry. No rash noted. not diaphoretic.  Psychiatric:  normal mood and affect. behavior is normal. Thought content normal.    Labs    Chemistry Recent Labs  Lab 10/29/17 2106 11/01/17 1322 11/02/17 0414 11/04/17 0501  NA 135 132* 133* 129*  K 4.1 3.1* 4.0 3.4*  CL 100 94* 93* 87*  CO2 26 30 31  32  GLUCOSE 191* 158* 152*  133*  BUN 27* 22 18 15   CREATININE 1.11* 0.78 0.78 0.72  CALCIUM 8.9 8.6* 8.4* 8.4*  PROT 5.4*  --   --   --   ALBUMIN 2.8*  --   --   --   AST 104*  --   --   --   ALT 100*  --   --   --   ALKPHOS 96  --   --   --   BILITOT 0.9  --   --   --   GFRNONAA 49* >60 >60 >60  GFRAA 57* >60 >60 >60  ANIONGAP 9 8 9 10      Hematology Recent Labs  Lab 11/02/17 0414 11/03/17 0528 11/03/17 1540 11/04/17 0501  WBC 2.1* 2.1*  --  1.6*  RBC 3.01* 2.85*  --  2.87*  HGB 9.0* 8.5*  --  8.6*  HCT 25.7* 24.3*  --  24.4*  MCV 85.4 85.3  --  85.0  MCH 30.0 29.9  --  29.8  MCHC 35.1 35.1  --  35.1  RDW 13.7 13.7  --  13.7  PLT 12* 6* 44* 19*    Cardiac Enzymes Recent Labs  Lab 10/29/17 2106 10/30/17 0330 10/30/17 0753   TROPONINI 0.73*  0.77* 3.26* 5.48*    BNP Recent Labs  Lab 10/30/17 0753  BNP 2,675.0*      Radiology    No results found.  Telemetry    Sinus tachycardia - 100 - 120's . LBBB- Personally Reviewed  Cardiac Studies   2D Echocardiogram 8.9.2019  Left ventricle: The cavity size was normal. Wall thickness was   normal. Systolic function was severely reduced. The estimated   ejection fraction was in the range of 20% to 25%. Diffuse   hypokinesis. The study is not technically sufficient to allow   evaluation of LV diastolic function. - Mitral valve: Calcified annulus. There was mild regurgitation. - Pulmonary arteries: Systolic pressure could not be accurately   estimated. - Inferior vena cava: The vessel was moderately dilated. The   respirophasic diameter changes were blunted (< 50%), consistent   with elevated central venous pressure. - Pericardium, extracardiac: A small pericardial effusion was   identified. There was no evidence of hemodynamic compromise. _____________   Patient Profile     72 y.o. 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  She presented with shortness of breath and was found to have non-ST elevation myocardial infarction.   Assessment & Plan    1.  NSTEMI:   Trop peaked @ 5.48 on 8/21   not a candidate for cardiac catheterization at this time On beta blockers, nitrates, statin No antiplatelet medications due to severe thrombocytopenia and anemia.  2.  Acute on chronic systolic CHF:   EF 15-17% by echo 8/9.   Her weight is down to 58.4 kg which seems to be her dry weight before she developed heart failure. I switch furosemide to 40 mg by mouth once daily. Continue Toprol. I added small dose losartan. If blood pressure tolerates, recommend  adding small dose spironolactone tomorrow  3.  bone marrow failure w/ h/o stage IV breast cancer:   Followed by oncology.   S/p 1u prbc Scheduled for platelets,  Oncology to transfuse for hemoglobin less than 8  I discussed the case with family and Dr. Rogue Bussing.  Signed, Rogue Jury  Fletcher Anon, MD  11/04/2017, 8:44 AM    For questions or updates, please contact   Please consult [email or web address] for contact info under Cardiology/STEMI.

## 2017-11-04 NOTE — Progress Notes (Signed)
2000 hr tx refused, advised pt and family to call if PRN tx was needed at any point.

## 2017-11-04 NOTE — Progress Notes (Signed)
[facility] [facility] [place] NAME: Shelly Arias    MR#:  [id number]  DATE OF BIRTH:  07/18/1945  SUBJECTIVE: Admitted because of chest pain, non-ST elevation MI, acute on chronic systolic heart failure.  Patient has history of MDS, bone marrow failure, gets recurrent blood transfusion, platelet transfusion, received 1 unit of packed RBC transfusion yesterday, after that patient had trouble breathing, wheezing, could not rest last night.  Today she is very weak, has mild chest pain, shortness of breath.  CHIEF COMPLAINT:   Chief Complaint  Patient presents with  . Chest Pain  Patient seen and evaluated today No spontaneous bleeding Decreased shortness of breath No complaints of any chest pain Tolerating diet ok Platelet transfusion completed yesterday  REVIEW OF SYSTEMS:   ROS CONSTITUTIONAL: No fever, fatigue, appears pale, appears chronically ill.  Cachectic.  EYES: No blurred or double vision.  EARS, NOSE, AND THROAT: No tinnitus or ear pain.  RESPIRATORY:  Mild shortness of breath, no chest pain CARDIOVASCULAR: Has some chest pain on and off.   GASTROINTESTINAL: No nausea, vomiting, diarrhea or abdominal pain.  GENITOURINARY: No dysuria, hematuria.  ENDOCRINE: No polyuria, nocturia,  HEMATOLOGY: No anemia, easy bruising or bleeding SKIN: No rash or lesion. MUSCULOSKELETAL: Leg edema.  Marland Kitchen  NEUROLOGIC: No tingling, numbness, weakness.  PSYCHIATRY: No anxiety or depression.   DRUG ALLERGIES:   Allergies  Allergen Reactions  . Codeine Anaphylaxis  . Fish-Derived Products Anaphylaxis  . Morphine Sulfate Other (See Comments)    REACTION: Hypotension  . Adhesive [Tape]     PAPER TAPE OK TO USE  . Oxycodone     Nausea and vomiting    VITALS:  Blood pressure 126/78, pulse (!) 110, temperature 98 F (36.7 C), temperature source Oral, resp. rate 20, height 5\' 1"  (1.549 m), weight 58.4 kg, SpO2 100 %.  PHYSICAL EXAMINATION:   GENERAL:  72 y.o.-year-old patient lying in the bed with no acute distress.  EYES: Pupils equal, round, reactive to light and accommodation. No scleral icterus. Extraocular muscles intact.  HEENT: Head atraumatic, normocephalic. Oropharynx and nasopharynx clear.  NECK:  Supple, no jugular venous distention. No thyroid enlargement, no tenderness.  LUNGS: Normal breath sounds bilaterally, scattered wheezing, basal rales heard. No use of accessory muscles of respiration.  CARDIOVASCULAR: S1, S2 normal. No murmurs, rubs, or gallops.  ABDOMEN: Soft, nontender, nondistended. Bowel sounds present. No organomegaly or mass.  EXTREMITIES: Bilateral leg edema 1+. NEUROLOGIC: Cranial nerves II through XII are intact. Muscle strength 5/5 in all extremities. Sensation intact. Gait not checked.  PSYCHIATRIC: The patient is alert and oriented x 3.  SKIN: No obvious rash, lesion, or ulcer.    LABORATORY PANEL:   CBC Recent Labs  Lab 11/04/17 0501  WBC 1.6*  HGB 8.6*  HCT 24.4*  PLT 19*   ------------------------------------------------------------------------------------------------------------------  Chemistries  Recent Labs  Lab 10/29/17 2106  11/04/17 0501  NA 135   < > 129*  K 4.1   < > 3.4*  CL 100   < > 87*  CO2 26   < > 32  GLUCOSE 191*   < > 133*  BUN 27*   < > 15  CREATININE 1.11*   < > 0.72  CALCIUM 8.9   < > 8.4*  MG 1.7  --   --   AST 104*  --   --   ALT 100*  --   --   ALKPHOS 96  --   --  BILITOT 0.9  --   --    < > = values in this interval not displayed.   ------------------------------------------------------------------------------------------------------------------  Cardiac Enzymes Recent Labs  Lab 10/30/17 0753  TROPONINI 5.48*   ------------------------------------------------------------------------------------------------------------------  RADIOLOGY:  Dg Chest Port 1 View  Result Date: 11/04/2017 CLINICAL DATA:  Acute systolic (congestive) heart  failure Weakness / h/o CA , sob, cough , congestion EXAM: PORTABLE CHEST 1 VIEW COMPARISON:  10/29/2017 FINDINGS: The patient has a RIGHT-sided PowerPort, tip overlying the level of the RIGHT atrium. Heart size is normal. There are small bilateral pleural effusions. Bibasilar opacities are consistent with atelectasis, RIGHT greater than LEFT. No pulmonary edema. Surgical clips in the region of the RIGHT axilla. There are moderate degenerative changes in the thoracolumbar spine. IMPRESSION: Bilateral pleural effusions and bibasilar opacities, likely related atelectasis. Electronically Signed   By: Nolon Nations M.D.   On: 11/04/2017 09:00    EKG:   Orders placed or performed during the hospital encounter of 10/29/17  . ED EKG within 10 minutes  . ED EKG within 10 minutes  . EKG 12-Lead  . EKG 12-Lead  . EKG 12-Lead  . EKG 12-Lead  . EKG 12-Lead    ASSESSMENT AND PLAN:   72 year old female patient with underlying metastatic right breast cancer, myelodysplastic syndrome with marrow failure gets recurrent blood transfusions and platelet transfusion every week at cancer center comes in with chest pain and found to have elevated troponins and admitted to telemetry.  1.Nonstemi : patient is not candidate for aggressive intervention secondary to her underlying myelodysplastic syndrome and severe thrombocytopenia Status post cardiology evaluation Medical management as feasible Currently on beta-blocker, nitrates and statin medication  2.  Acute on chronic systolic heart failure with EF 25% Started oral Lasix today Continue oral beta-blocker there is Toprol Low-dose losartan added by cardiology Blood pressure tolerates then will add Aldactone tomorrow  3. Severe thrombocytopenia Platelet transfusions completed yesterday Platelets improved to 44,000 but again dropped back to 17,000 No platelet transfusions as per oncology today  4.  Idiopathic bone marrow my failure, stage IV breast  cancer Recurrent anemia, thrombocytopenia, gets irradiated blood, platelets every week at cancer center so far Patient received platelet transfusion PRBC transfusion in this hospitalization Monitor blood counts Palliative care team to discuss goals of care with the family Patient is DNR by [place]  5.  Severe malnutrition in the context of chronic illness.  Seen by dietitian, patient continue Ensure supplements.  6.  Chronic infection prophylaxis with acyclovir, Levaquin.  7.Prognosis extremely poor in view of multiple medical problems and comorbidities. Discussed medical condition treatment plan with patient's family in detail along with oncology   All the records are reviewed and case discussed with Care Management/Social Workerr. Management plans discussed with the patient, family and they are in agreement.  CODE STATUS: DNR  TOTAL TIME TAKING CARE OF THIS PATIENT: 33 minutes.   More than 50% time spent in counseling, coordination of care, discussed with multiple family members especially the son, daughter at bedside.  Spoke with Dr.Yu  from oncology.    POSSIBLE D/C IN 2 to 3 DAYS, DEPENDING ON CLINICAL CONDITION.   Saundra Shelling M.D on 11/04/2017 at 11:37 AM  Between 7am to 6pm - Pager - [phone]  After 6pm go to [email or web address] - password [email or web address] [facility]  [facility]  Office  [phone]  CC: Primary care physician; [name], MD   Note: This dictation was prepared with Dragon dictation along with smaller  Company secretary. Any transcriptional errors that result from this process are unintentional.

## 2017-11-04 NOTE — Progress Notes (Signed)
   11/04/17 1405  Clinical Encounter Type  Visited With Patient and family together  Visit Type Follow-up;Spiritual support  Referral From Nurse  Consult/Referral To Chaplain  Spiritual Encounters  Spiritual Needs Prayer;Emotional;Grief support   [facility] followed up with a patient from Oncology that I have supported for many months. Shelly Arias is being treated for a heart attack that has occurred recently. I spent time talking about her family and our friendship that has developed through ministerial care. Shelly Arias has been treated off and on over the last 12 years for cancer. I will continue to follow up as needed.

## 2017-11-05 ENCOUNTER — Inpatient Hospital Stay: Payer: Medicare Other

## 2017-11-05 MED ORDER — OXYMETAZOLINE HCL 0.05 % NA SOLN
1.0000 | Freq: Two times a day (BID) | NASAL | Status: DC
  Filled 2017-11-05: qty 15

## 2017-11-05 MED ORDER — LORAZEPAM 2 MG/ML IJ SOLN: 1.0000 mg | INTRAMUSCULAR | Status: DC | PRN

## 2017-11-05 MED ORDER — MORPHINE SULFATE (PF) 2 MG/ML IV SOLN
1.0000 mg | INTRAVENOUS | Status: DC | PRN
  Administered 2017-11-05 – 2017-11-06 (×5): 2 mg via INTRAVENOUS
  Filled 2017-11-05 (×5): qty 1

## 2017-11-05 MED ORDER — IPRATROPIUM-ALBUTEROL 0.5-2.5 (3) MG/3ML IN SOLN
3.0000 mL | Freq: Four times a day (QID) | RESPIRATORY_TRACT | Status: DC | PRN
  Administered 2017-11-05 – 2017-11-06 (×2): 3 mL via RESPIRATORY_TRACT
  Filled 2017-11-05 (×2): qty 3

## 2017-11-05 NOTE — Progress Notes (Signed)
Daily Progress Note   Patient Name: Shelly Arias       Date: 11/05/2017 DOB: Jul 21, 1945  Age: 72 y.o. MRN#: [id number] Attending Physician: Saundra Shelling, MD Primary Care Physician: [name], MD Admit Date: 10/29/2017  Reason for Consultation/Follow-up: Establishing goals of care, Non pain symptom management, Pain control and Psychosocial/spiritual support  Subjective: Patient is lying in bed. She denies pain at this time. Does complain of intermittent episodes of shortness of breath and some anxiousness at times. Color is pale and she complains also of being weak. She is tearful and states "I am not doing so well". Family is at bedside, son, husband, daughter, son-in-law, and her brother. Patient states "can we talk about were do we go from here!"   Sat at the bedside and had a long detailed conversation with family and patient. Both patient and family are tearful. They had many questions in regards to next steps and how to keep patient comfortable. We discuss what patient's wishes are, which she expressed to not suffer and for comfort. She is having more episodes of anxiety which son, reports he feels comes more with her shortness of breath episodes. Patient is tearful and reports she "I am scared of the unknown.". We discussed what she was referring to in response to the unknown. Patient wanted to discuss what were signs that she was dying and declining. I attempted to discuss in details. Her husband verbalized that they feel that her body was no longer accepting the platelet or blood transfusions. We discussed in details how although she continued with transfusions, but is requiring them more frequently. Family and patient remained tearful during conversation. Patient and family has  agreed that they were ok with discontinuing any further transfusions as they are not seeming to help. Patient verbalized her anxiety of bleeding out. We discussed that she would become more weaker and may began to sleep more. She could also began to experience more pain or shortness of breath. We discussed hospice care and how they would be of support if she and her family was interested to assist with managing symptoms and making sure she was kept as comfortable as possible. We discussed the option of returning home with 24/7 family support and hospice or to residential hospice facility. Patient tearful. Son and daughter comforting mother as  I supported her husband and brother. Children tearfully looked at their mother and stated "mom you don't have to try to be strong for [place]. We are here to support you and want to make sure that you are comfortable and not suffering. We don't want you to continue with treatments and trying to hold on to medical interventions that are unnecessary with hopes of hanging around for [place]!" Family very tearful and support was given during this difficult time and decision. Patient verbalized she wanted to go to the hospice home with family around her. She wanted to go there so they could focus on her and spend their last moments and have 24/7 nursing care as she worries her family will be to emotional to treat her symptoms. Family agreed and verbalized they supported her decisions and reasoning. We discussed briefly the goals and care provided at the hospice facility. Husband and patient are familiar with the facility as they are pillars of the community and served on [employer] and volunteered.   We discussed next steps of preparing for residential hospice facility. At this time patient and family has requested to continue treating the treatable without escalation of care of further transfusions until she can transition to hospice. They are aware if there are no bed availabilities the  decision to initiate comfort measures only here in the hospital may be necessary. Family verbalized understanding and appreciation. Patient requesting for medication for anxiety. Will have bedside RN administer Ativan which is appropriate at this time.   Family knows to contact our team or provider with any further questions or needs.   Chart Reviewed, report received and given to bedside RN and discussed with Dr. Rogue Bussing.   Length of Stay: 6  Current Medications: Scheduled Meds:  . acyclovir  400 mg Oral BID  . fluconazole  100 mg Oral Daily  . fluticasone furoate-vilanterol  1 puff Inhalation Daily  . furosemide  40 mg Oral Daily  . ipratropium-albuterol  3 mL Nebulization Q6H  . isosorbide mononitrate  30 mg Oral Daily  . letrozole  2.5 mg Oral Daily  . levofloxacin  500 mg Oral Daily  . losartan  25 mg Oral Daily  . megestrol  400 mg Oral BID  . metoprolol succinate  50 mg Oral Daily  . multivitamin with minerals  1 tablet Oral Daily  . nicotine  21 mg Transdermal Daily  . pantoprazole  40 mg Oral BID  . pravastatin  40 mg Oral Daily  . sodium chloride flush  10-40 mL Intracatheter Q12H  . sodium chloride flush  3 mL Intravenous Q12H  . sucralfate  1 g Oral TID WC & HS  . Tbo-filgastrim (GRANIX) SQ  480 mcg Subcutaneous Daily  . vitamin C  250 mg Oral BID    Continuous Infusions:   PRN Meds: acetaminophen, alum & mag hydroxide-simeth, antiseptic oral rinse, bisacodyl, diphenhydrAMINE, famotidine, guaiFENesin, heparin lock flush, heparin lock flush, LORazepam, morphine injection, nitroGLYCERIN, ondansetron (ZOFRAN) IV, promethazine, senna-docusate, sodium chloride flush  Physical Exam  Constitutional: She is oriented to person, place, and time. She appears cachectic. She is cooperative. She has a sickly appearance.  Thin and frail in appearance, chronically ill.   Cardiovascular: Normal heart sounds. An irregular rhythm present. Exam reveals decreased pulses.    Pulmonary/Chest: She has decreased breath sounds.  Shortness of breath, 3L/[place]   Neurological: She is alert and oriented to person, place, and time.  Psychiatric: Her mood appears anxious.  Nursing note and vitals  reviewed.           Vital Signs: BP 124/79 (BP Location: Left Arm)   Pulse (!) 113   Temp 98.8 F (37.1 C) (Oral)   Resp 18   Ht 5\' 1"  (1.549 m)   Wt 58.4 kg   SpO2 99%   BMI 24.32 kg/m  SpO2: SpO2: 99 % O2 Device: O2 Device: Nasal Cannula O2 Flow Rate: O2 Flow Rate (L/min): 2 L/min  Intake/output summary:   Intake/Output Summary (Last 24 hours) at 11/05/2017 1137 Last data filed at 11/04/2017 1656 Gross per 24 hour  Intake -  Output 200 ml  Net -200 ml   LBM: Last BM Date: 11/04/17 Baseline Weight: Weight: 58.1 kg Most recent weight: Weight: 58.4 kg       Palliative Assessment/Data:PPS 20 % however patient remains able to sit/lie with assistance     Patient Active Problem List   Diagnosis Date Noted  . Elevated troponin   . Anemia   . Non-ST elevation (NSTEMI) myocardial infarction ([place])   . Chest pain 10/30/2017  . Cellulitis 10/17/2017  . Malnutrition of moderate degree 08/06/2017  . Vaginal bleeding 08/05/2017  . Tachycardia 07/30/2017  . Constipation 12/27/2016  . B12 deficiency 10/09/2016  . MDS (myelodysplastic syndrome) ([place]) 09/24/2016  . GI bleed 08/03/2016  . Thrombocytopenia ([place]) 08/03/2016  . 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 Persistent headaches 07/02/2016  . Mass of upper inner quadrant of left breast 06/20/2016  . Carcinoma of overlapping sites of right breast in female, estrogen receptor positive ([place]) 06/08/2016  . Chronic fatigue 06/08/2016  . Nutritional anemia, unspecified 06/08/2016  . Prediabetes 01/25/2016  . Tobacco abuse 11/06/2012  . History of breast cancer 11/06/2012  . Osteopenia 11/08/2011  . Hyperlipidemia 11/02/2010  . Essential hypertension 12/23/2006  . GERD 12/23/2006    Palliative Care Assessment & Plan    Patient Profile: 72 y.o. female on 10/29/2017 from home with chest pain. She has a past medical history of metastatic right breast cancer ER/PR positive (s/p surgery and chemoradiation), pancytopenia, MDS with marrow failure, osteoporosis, hypertension, hyperlipidemia, GERD, and COPD. On arrival patient reported developing sudden  Onset of midchest and mid back pain after dinner with heartburn. Patient endorsed palpitation, nausea, and diaphoresis. Pain worsens with cough and deep inspiration. During her ED course BUN 27. Creatinine 1.11. AST 104. ALT 100. Troponin 0.77 which later increased to 0.73, 3.26, 5.48. WBC 1.4. Platelets 34. BNP 2675.0, CT of angio showed no PE, moderate right and small to moderate left pleural effusion, pericardial effusion, recurrent partial collapse of right middle lobe with near complete occlusion at the origin of the right middle lobe bronchus. Since admission patient has been seen by Cardiology and is not a candidate for catheterization/PCI. Recommendations to gently diurese. Medical management with po medications. She has also been seen by Oncology who will continue to closely follow and advise for further transfusion needs. Palliative Medicine team consulted for goals of care discussion  Assessment: Thin, cachetic, frail. Patient and family is tearful during visit. She is somewhat anxious with intermittent episodes of shortness of breath. She denied pain. Patient continues to show signs of decline despite medical interventions (transfusions and medications). Patient is nearing end-of-life. Family very supportive and remains at bedside 24/7. They feel that patient has been suffering over the past several days and their goal and patient's goal is for comfort and to spend the last moments together peaceful and comfortable. Patient is accepting of her decline and end-of-life decisions  with some anxiety and fear of the process. Support and comfort given.    Recommendations/Plan:  DNR/DNI  Continue to treat the treatable without escalation of are. We had a long discussion regarding continuation of blood/platelet transfusions. Patient and family have agreed to discontinue transfusions in preparation for end-of-life care. They verbalize goal is for comfort and to spend the best of what time she has left.   CSW consult and also spoke with Santiago Glad, Therapist, sports (hospice liaison) regarding placement at [facility] residential hospice facility per patient's request.   Ativan 0.5mg  IV x1 dose for anxiety. RN may repeat x1.   Will continue with current regimens and plan to discuss in the future shifting to full comfort if no bed is available at hospice.   Palliative will continue to support patient, family, and medical team during hospitalization. Dr. Rogue Bussing updated on conversations and plan.   Goals of Care and Additional Recommendations:  Limitations on Scope of Treatment: Full Scope Treatment-continue treating the treatable without escalation of care including no further transfusions.   Code Status:    Code Status Orders  (From admission, onward)         Start     Ordered   10/30/17 1457  Do not attempt resuscitation (DNR)  Continuous    Question Answer Comment  In the event of cardiac or respiratory ARREST Do not call a "code blue"   In the event of cardiac or respiratory ARREST Do not perform Intubation, CPR, defibrillation or ACLS   In the event of cardiac or respiratory ARREST Use medication by any route, position, wound care, and other measures to relive pain and suffering. May use oxygen, suction and manual treatment of airway obstruction as needed for comfort.      10/30/17 1456        Code Status History    Date Active Date Inactive Code Status Order ID Comments User Context   10/30/2017 0828 10/30/2017 1456 Full Code [id number]  Arta Silence, MD ED   10/17/2017 1718 10/19/2017 1841 Full Code [id number]  Salary, Avel Peace, MD  Inpatient   08/05/2017 1448 08/07/2017 1824 Full Code [id number]  Epifanio Lesches, MD ED   07/21/2017 1841 07/25/2017 2033 Full Code [id number]  Bettey Costa, MD Inpatient   08/03/2016 2042 08/05/2016 1506 Full Code [id number]  Vaughan Basta, MD ED    Advance Directive Documentation     Most Recent Value  Type of Advance Directive  Healthcare Power of [place], Living will  Pre-existing out of facility DNR order (yellow form or pink MOST form)  -  "MOST" Form in Place?  -       Prognosis:   Hours - Days-in the setting of N-STEMI with medical management only, metastatic right breast cancer ER/PR positive, pancytopenia, MDS with bone marrow failure, osteoporosis, decreased mobility, decreased po intake, hypertension, COPD, oxygen dependent, severe protein calorie malnutrition, weight loss >10% in the last 3 months, shotrness of breath, acute CHF.   Discharge Planning:  Hospice facility  Care plan was discussed with patient, family, bedside RN, Dr. Rogue Bussing, and Dr. Estanislado Pandy.   Thank you for allowing the Palliative Medicine Team to assist in the care of this patient.   Time In: 1400 Time Out: 1530 Total Time 90 min.  Prolonged Time Billed  YES       Greater than 50%  of this time was spent counseling and coordinating care related to the above assessment and plan.  Alda Lea, NP-BC Palliative Medicine Team  Phone: [phone] Fax:  [phone] Pager: [phone] Amion: Bjorn Pippin    Please contact Palliative Medicine Team phone at [phone] for questions and concerns.

## 2017-11-05 NOTE — Clinical Social Work Note (Signed)
Clinical Social Work Assessment  Patient Details  Name: Shelly Arias MRN: [id number] Date of Birth: 1945/12/30  Date of referral:  11/05/17               Reason for consult:  Facility Placement                Permission sought to share information with:  Case Manager, Customer service manager, Family Supports Permission granted to share information::  Yes, Verbal Permission Granted  Name::        Agency::     Relationship::     Contact Information:     Housing/Transportation Living arrangements for the past 2 months:  Single Family Home Source of Information:  Spouse Patient Interpreter Needed:  None Criminal Activity/Legal Involvement Pertinent to Current Situation/Hospitalization:  No - Comment as needed Significant Relationships:  Spouse, Other Family Members, Adult Children Lives with:  Spouse Do you feel safe going back to the place where you live?  No Need for family participation in patient care:  Yes (Comment)  Care giving concerns:  Patient lives with husband in [place] Worker assessment / plan:  CSW consulted for Hospice home placement. CSW met with patient and husband Shelly Arias at bedside. Husband states that patient has decided that she wants to go to hospice home. Husband is requesting that patient go to [facility] home due to proximity to him and his family. CSW explained that currently there are no beds available at the hospice home. Family states understanding and would like to pursue hospice home anyways. CSW notified Santiago Glad, hospice liaison of referral. CSW will continue to follow for discharge planning.   Employment status:  Retired Forensic scientist:  Medicare PT Recommendations:  Not assessed at this time Information / Referral to community resources:     Patient/Family's Response to care:  Husband thanked CSW for assistance   Patient/Family's Understanding of and Emotional Response to Diagnosis, Current Treatment,  and Prognosis:  Family and patient understand that patient is end of life.   Emotional Assessment Appearance:  Appears stated age Attitude/Demeanor/Rapport:    Affect (typically observed):  Calm, Quiet Orientation:  Oriented to Self, Oriented to Place Alcohol / Substance use:  Not Applicable Psych involvement (Current and /or in the community):  No (Comment)  Discharge Needs  Concerns to be addressed:  Discharge Planning Concerns Readmission within the last 30 days:  Yes Current discharge risk:  None Barriers to Discharge:  Hospice Bed not available   Annamaria Boots, [place] 11/05/2017, 10:28 AM

## 2017-11-05 NOTE — Progress Notes (Signed)
New hospice home referral received from [place]. Family and hospital care team aware there is currently no bed availability. Patient information faxed to referral. Patient placed on the Hospice home wait list.Thank you. Flo Shanks RN, BSN, [facility] [place], hospital Liaison [phone]

## 2017-11-05 NOTE — Progress Notes (Signed)
Pt refused 0200 hr tx 

## 2017-11-05 NOTE — Progress Notes (Signed)
Patient has been restless. SOB with labored breathing. Administered iv morphine. Patient appears to be resting 30 minutes after administration.

## 2017-11-05 NOTE — Progress Notes (Signed)
Progress Note  Patient Name: Shelly Arias Date of Encounter: 11/05/2017  Primary Cardiologist: Nelva Bush, MD   Subjective   Short of breath this morning.  Currently feels well after receiving morphine.  Awaiting moved to 1C and ultimately inpatient hospice.  No chest pain.  Legs are still somewhat swollen.  Inpatient Medications    Scheduled Meds: . fluticasone furoate-vilanterol  1 puff Inhalation Daily  . furosemide  40 mg Oral Daily  . ipratropium-albuterol  3 mL Nebulization Q6H  . isosorbide mononitrate  30 mg Oral Daily  . letrozole  2.5 mg Oral Daily  . losartan  25 mg Oral Daily  . megestrol  400 mg Oral BID  . metoprolol succinate  50 mg Oral Daily  . nicotine  21 mg Transdermal Daily  . pantoprazole  40 mg Oral BID  . pravastatin  40 mg Oral Daily  . sucralfate  1 g Oral TID WC & HS  . vitamin C  250 mg Oral BID   Continuous Infusions:  PRN Meds: acetaminophen, alum & mag hydroxide-simeth, antiseptic oral rinse, bisacodyl, diphenhydrAMINE, famotidine, guaiFENesin, LORazepam, morphine injection, nitroGLYCERIN, ondansetron (ZOFRAN) IV, promethazine, senna-docusate   Vital Signs    Vitals:   11/04/17 1941 11/04/17 2121 11/05/17 0439 11/05/17 0455  BP: 109/64  124/79   Pulse: (!) 109  (!) 113   Resp: 18  18   Temp: 98.3 F (36.8 C)  98.8 F (37.1 C)   TempSrc: Oral  Oral   SpO2: 100% 99% 100% 99%  Weight:      Height:        Intake/Output Summary (Last 24 hours) at 11/05/2017 1448 Last data filed at 11/04/2017 1656 Gross per 24 hour  Intake -  Output 100 ml  Net -100 ml   Filed Weights   11/01/17 0359 11/03/17 0352 11/04/17 0401  Weight: 59.6 kg 59 kg 58.4 kg    Telemetry    N/A  ECG    No new tracing available  Physical Exam   GEN: No acute distress.   Neck: No JVD Cardiac:  Tachycardic but regular without murmurs. Respiratory:  Mildly diminished breath sounds at the bases bilaterally. GI: Soft, nontender, non-distended    MS:  1+ pretibial edema; No deformity. Neuro:  Nonfocal  Psych: Normal affect   Labs    Chemistry Recent Labs  Lab 10/29/17 2106 11/01/17 1322 11/02/17 0414 11/04/17 0501  NA 135 132* 133* 129*  K 4.1 3.1* 4.0 3.4*  CL 100 94* 93* 87*  CO2 26 30 31  32  GLUCOSE 191* 158* 152* 133*  BUN 27* 22 18 15   CREATININE 1.11* 0.78 0.78 0.72  CALCIUM 8.9 8.6* 8.4* 8.4*  PROT 5.4*  --   --   --   ALBUMIN 2.8*  --   --   --   AST 104*  --   --   --   ALT 100*  --   --   --   ALKPHOS 96  --   --   --   BILITOT 0.9  --   --   --   GFRNONAA 49* >60 >60 >60  GFRAA 57* >60 >60 >60  ANIONGAP 9 8 9 10      Hematology Recent Labs  Lab 11/02/17 0414 11/03/17 0528 11/03/17 1540 11/04/17 0501  WBC 2.1* 2.1*  --  1.6*  RBC 3.01* 2.85*  --  2.87*  HGB 9.0* 8.5*  --  8.6*  HCT 25.7* 24.3*  --  24.4*  MCV 85.4 85.3  --  85.0  MCH 30.0 29.9  --  29.8  MCHC 35.1 35.1  --  35.1  RDW 13.7 13.7  --  13.7  PLT 12* 6* 44* 19*    Cardiac Enzymes Recent Labs  Lab 10/29/17 2106 10/30/17 0330 10/30/17 0753  TROPONINI 0.73*  0.77* 3.26* 5.48*   No results for input(s): TROPIPOC in the last 168 hours.   BNP Recent Labs  Lab 10/30/17 0753  BNP 2,675.0*     DDimer No results for input(s): DDIMER in the last 168 hours.   Radiology    Dg Chest Port 1 View  Result Date: 11/04/2017 CLINICAL DATA:  Acute systolic (congestive) heart failure Weakness / h/o CA , sob, cough , congestion EXAM: PORTABLE CHEST 1 VIEW COMPARISON:  10/29/2017 FINDINGS: The patient has a RIGHT-sided PowerPort, tip overlying the level of the RIGHT atrium. Heart size is normal. There are small bilateral pleural effusions. Bibasilar opacities are consistent with atelectasis, RIGHT greater than LEFT. No pulmonary edema. Surgical clips in the region of the RIGHT axilla. There are moderate degenerative changes in the thoracolumbar spine. IMPRESSION: Bilateral pleural effusions and bibasilar opacities, likely related  atelectasis. Electronically Signed   By: Nolon Nations M.D.   On: 11/04/2017 09:00    Cardiac Studies   Echo (10/18/2017): - Left ventricle: The cavity size was normal. Wall thickness was   normal. Systolic function was severely reduced. The estimated   ejection fraction was in the range of 20% to 25%. Diffuse   hypokinesis. The study is not technically sufficient to allow   evaluation of LV diastolic function. - Mitral valve: Calcified annulus. There was mild regurgitation. - Pulmonary arteries: Systolic pressure could not be accurately   estimated. - Inferior vena cava: The vessel was moderately dilated. The   respirophasic diameter changes were blunted (< 50%), consistent   with elevated central venous pressure. - Pericardium, extracardiac: A small pericardial effusion was   identified. There was no evidence of hemodynamic compromise.  Patient Profile     72 y.o. female woman with history of metastatic breast cancer complicated by bone marrow failure with pancytopenia, admitted with acute systolic heart failure and NSTEMI.  Assessment & Plan    NSTEMI No further chest pain.  Medical management options are severely limited by pancytopenia and inability to anticoagulate or use antiplatelet therapy.  Given comfort care, no further intervention.  Discontinue pravastatin in the setting of transition to full hospice care.  Continue metoprolol succinate and isosorbide mononitrate for prevention of angina.  Acute systolic heart failure Patient is still mildly volume overloaded.  LVEF severely reduced by echo earlier this month.  Continue furosemide 40 mg p.o. daily to minimize pulmonary edema.  Discontinue losartan to minimize number of pills that the patient is taking, given that she is at the [place] of life.  Pancytopenia  Per oncology and internal medicine.  [facility] will sign off.   Medication Recommendations: Continue metoprolol, isosorbide mononitrate, and  furosemide for comfort.  Reasonable to continue with morphine for air hunger, as needed. Other recommendations (labs, testing, etc): No further testing recommended. Follow up as an outpatient: None.  For questions or updates, please contact [place] Please consult [email or web address] for contact info under [facility].      Signed, Nelva Bush, MD  11/05/2017, 2:48 PM

## 2017-11-05 NOTE — Progress Notes (Signed)
[place] at [place] NAME: Shelly Arias    MR#:  [id number]  DATE OF BIRTH:  Apr 29, 1945  SUBJECTIVE: Admitted because of chest pain, non-ST elevation MI, acute on chronic systolic heart failure.  Patient has history of MDS, bone marrow failure, gets recurrent blood transfusion, platelet transfusion, received 1 unit of packed RBC transfusion yesterday, after that patient had trouble breathing, wheezing, could not rest last night.  Today she is very weak, has mild chest pain, shortness of breath.  CHIEF COMPLAINT:   Chief Complaint  Patient presents with  . Chest Pain  Patient seen and evaluated today No spontaneous bleeding so far Cough and shortness of breath better No complaints of any chest pain Tolerating diet ok  REVIEW OF SYSTEMS:   ROS CONSTITUTIONAL: No fever,has  fatigue, appears pale, appears chronically ill.  Cachectic.  EYES: No blurred or double vision.  EARS, NOSE, AND THROAT: No tinnitus or ear pain.  RESPIRATORY:  Mild shortness of breath, no chest pain CARDIOVASCULAR: Has some chest pain on and off.   GASTROINTESTINAL: No nausea, vomiting, diarrhea or abdominal pain.  GENITOURINARY: No dysuria, hematuria.  ENDOCRINE: No polyuria, nocturia,  HEMATOLOGY: No anemia, easy bruising or bleeding SKIN: No rash or lesion. MUSCULOSKELETAL: Leg edema.  Marland Kitchen  NEUROLOGIC: No tingling, numbness, weakness.  PSYCHIATRY: No anxiety or depression.   DRUG ALLERGIES:   Allergies  Allergen Reactions  . Codeine Anaphylaxis  . Fish-Derived Products Anaphylaxis  . Morphine Sulfate Other (See Comments)    REACTION: Hypotension  . Adhesive [Tape]     PAPER TAPE OK TO USE  . Oxycodone     Nausea and vomiting    VITALS:  Blood pressure 124/79, pulse (!) 113, temperature 98.8 F (37.1 C), temperature source Oral, resp. rate 18, height 5\' 1"  (1.549 m), weight 58.4 kg, SpO2 99 %.  PHYSICAL EXAMINATION:  GENERAL:  72 y.o.-year-old  patient lying in the bed with no acute distress.  EYES: Pupils equal, round, reactive to light and accommodation. No scleral icterus. Extraocular muscles intact.  HEENT: Head atraumatic, normocephalic. Oropharynx and nasopharynx clear.  NECK:  Supple, no jugular venous distention. No thyroid enlargement, no tenderness.  LUNGS: Normal breath sounds bilaterally, scattered wheezing, basal rales heard. No use of accessory muscles of respiration.  CARDIOVASCULAR: S1, S2 normal. No murmurs, rubs, or gallops.  ABDOMEN: Soft, nontender, nondistended. Bowel sounds present. No organomegaly or mass.  EXTREMITIES: Bilateral leg edema 1+. NEUROLOGIC: Cranial nerves II through XII are intact. Muscle strength 5/5 in all extremities. Sensation intact. Gait not checked.  PSYCHIATRIC: The patient is alert and oriented x 3.  SKIN: No obvious rash, lesion, or ulcer.    LABORATORY PANEL:   CBC Recent Labs  Lab 11/04/17 0501  WBC 1.6*  HGB 8.6*  HCT 24.4*  PLT 19*   ------------------------------------------------------------------------------------------------------------------  Chemistries  Recent Labs  Lab 10/29/17 2106  11/04/17 0451 11/04/17 0501  NA 135   < >  --  129*  K 4.1   < >  --  3.4*  CL 100   < >  --  87*  CO2 26   < >  --  32  GLUCOSE 191*   < >  --  133*  BUN 27*   < >  --  15  CREATININE 1.11*   < >  --  0.72  CALCIUM 8.9   < >  --  8.4*  MG 1.7  --  1.5*  --  AST 104*  --   --   --   ALT 100*  --   --   --   ALKPHOS 96  --   --   --   BILITOT 0.9  --   --   --    < > = values in this interval not displayed.   ------------------------------------------------------------------------------------------------------------------  Cardiac Enzymes Recent Labs  Lab 10/30/17 0753  TROPONINI 5.48*   ------------------------------------------------------------------------------------------------------------------  RADIOLOGY:  Dg Chest Port 1 View  Result Date:  11/04/2017 CLINICAL DATA:  Acute systolic (congestive) heart failure Weakness / h/o CA , sob, cough , congestion EXAM: PORTABLE CHEST 1 VIEW COMPARISON:  10/29/2017 FINDINGS: The patient has a RIGHT-sided PowerPort, tip overlying the level of the RIGHT atrium. Heart size is normal. There are small bilateral pleural effusions. Bibasilar opacities are consistent with atelectasis, RIGHT greater than LEFT. No pulmonary edema. Surgical clips in the region of the RIGHT axilla. There are moderate degenerative changes in the thoracolumbar spine. IMPRESSION: Bilateral pleural effusions and bibasilar opacities, likely related atelectasis. Electronically Signed   By: Nolon Nations M.D.   On: 11/04/2017 09:00    EKG:   Orders placed or performed during the hospital encounter of 10/29/17  . ED EKG within 10 minutes  . ED EKG within 10 minutes  . EKG 12-Lead  . EKG 12-Lead  . EKG 12-Lead  . EKG 12-Lead  . EKG 12-Lead    ASSESSMENT AND PLAN:   72 year old female patient with underlying metastatic right breast cancer, myelodysplastic syndrome with marrow failure gets recurrent blood transfusions and platelet transfusion every week at cancer center comes in with chest pain and found to have elevated troponins and admitted to telemetry.  1.Nonstemi : patient is not candidate for aggressive intervention secondary to her underlying myelodysplastic syndrome and severe thrombocytopenia Status post cardiology evaluation Medical management as feasible Patient and family do not want any aggressive intervention Currently on beta-blocker, nitrates and statin medication  2.  Acute on chronic systolic heart failure with EF 25% Started oral Lasix early Continue oral beta-blocker there is Toprol Low-dose losartan added by cardiology Supportive care  3. Severe thrombocytopenia Platelet transfusions completed yesterday Family does not want any aggressive measures and blood transfusions and platelet  transfusions Palliative medicine consultation done Supportive care  4.  Idiopathic bone marrow my failure, stage IV breast cancer Palliative care team  discussed goals of care with the family and patient They do not want any aggressive measures, interventions No more blood work, antibiotics and blood transfusion and platelet transfusions Supportive care Patient is DNR by CODE STATUS  7.Prognosis extremely poor in view of multiple medical problems and comorbidities. Discussed medical condition treatment plan with patient's family in detail along with oncology   All the records are reviewed and case discussed with Care Management/Social Workerr. Management plans discussed with the patient, family and they are in agreement.  CODE STATUS: DNR  TOTAL TIME TAKING CARE OF THIS PATIENT: 33 minutes.   More than 50% time spent in counseling, coordination of care, discussed with multiple family members especially the son, daughter at bedside.  Spoke with Dr.Yu  from oncology.    POSSIBLE D/C IN 2 to 3 DAYS, DEPENDING ON CLINICAL CONDITION.   Saundra Shelling M.D on 11/05/2017 at 12:47 PM  Between 7am to 6pm - Pager - [phone]  After 6pm go to [email or web address] - password [email or web address] [facility]  [place] [facility]  Office  [phone]  CC: Primary care physician; [name], MD  Note: This dictation was prepared with Dragon dictation along with smaller phrase technology. Any transcriptional errors that result from this process are unintentional.

## 2017-11-05 NOTE — Plan of Care (Signed)

## 2017-11-05 NOTE — Progress Notes (Signed)
Transferred to 1C. No concerns offered from patient. Belonging gathered up and taken by family to room.

## 2017-11-05 NOTE — Progress Notes (Signed)
Shelly Arias   DOB:1945-05-09   TM#:[id number]    Subjective: Patient resting; as per family patient had a "difficult night"-difficulty breathing; improved after morphine IV.  Continues to get short of breath with minimal exertion.  Continue to wean oxygen.  Objective:  Vitals:   11/05/17 0439 11/05/17 0455  BP: 124/79   Pulse: (!) 113   Resp: 18   Temp: 98.8 F (37.1 C)   SpO2: 100% 99%    No intake or output data in the 24 hours ending 11/05/17 1816 GENERAL: Thin built moderately poorly nourished female patient alert, no distress and comfortable.  Accompanied by husband/son.  On O2 2Lit/min EYES: Positive for pallor. OROPHARYNX: no thrush or ulceration. NECK: supple, no masses felt LYMPH:  no palpable lymphadenopathy in the cervical, axillary or inguinal regions LUNGS: decreased breath sounds to auscultation at bases.  Positive for wheezing on the right side. HEART/CVS: Tachycardic ; regular rhythm and no murmurs; positive for leg swelling bilateral.  ABDOMEN: abdomen soft, non-tender and normal bowel sounds Musculoskeletal:no cyanosis of digits and no clubbing  PSYCH: alert & oriented x 3 with fluent speech NEURO: no focal motor/sensory deficits SKIN: Multiple ecchymosis.   Labs:  Lab Results  Component Value Date   WBC 1.6 (L) 11/04/2017   HGB 8.6 (L) 11/04/2017   HCT 24.4 (L) 11/04/2017   MCV 85.0 11/04/2017   PLT 19 (LL) 11/04/2017   NEUTROABS 0.8 (L) 11/04/2017    Lab Results  Component Value Date   NA 129 (L) 11/04/2017   K 3.4 (L) 11/04/2017   CL 87 (L) 11/04/2017   CO2 32 11/04/2017    Studies:  Dg Chest Port 1 View  Result Date: 11/04/2017 CLINICAL DATA:  Acute systolic (congestive) heart failure Weakness / h/o CA , sob, cough , congestion EXAM: PORTABLE CHEST 1 VIEW COMPARISON:  10/29/2017 FINDINGS: The patient has a RIGHT-sided PowerPort, tip overlying the level of the RIGHT atrium. Heart size is normal. There are small bilateral pleural effusions.  Bibasilar opacities are consistent with atelectasis, RIGHT greater than LEFT. No pulmonary edema. Surgical clips in the region of the RIGHT axilla. There are moderate degenerative changes in the thoracolumbar spine. IMPRESSION: Bilateral pleural effusions and bibasilar opacities, likely related atelectasis. Electronically Signed   By: Nolon Nations M.D.   On: 11/04/2017 09:00    Assessment & Plan:   ASSESSMENT & PLAN:   # 72 year old female patient with a history of undiagnosed bone marrow failure syndrome; metastatic breast cancer-currently admitted the hospital for worsening shortness of breath chest pain/non-STEMI with acute CHF  # "Bone marrow failure syndrome"-status post multiple evaluations-without any clear diagnosis.  Platelets/PRBC transfusion dependent.  Given the overall poor prognosis; and lack of significant benefit with transfusions-patient family decided with no further blood work or blood transfusions.  #Acute CHF/non-STEMI-patient not a candidate for any antiplatelet/antithrombotic therapy.  Stable/no improvement.  Continue with Nitropatch; morphine for chest pain/air hunger.    # Metastatic breast cancer-ER PR positive letrozole.  Discontinue letrozole.  #DNR/DNI-recommend comfort care/hospice home disposition-however but bed not available.  Will discontinue nonessential medications at this time as per pt request.   # 25 minutes face-to-face with the patient's husband discussing the above plan of care; more than 50% of time spent on prognosis/ natural history; counseling and coordination.  Significant amount of time spent with her husband discussing-manner of death-which is likely bleeding given the absence of platelet transfusion.  This was of course a very difficult discussion.  Discussed with Dr.  Pyreddy.   [name], MD 11/05/2017  6:16 PM

## 2017-11-06 DIAGNOSIS — M899 Disorder of bone, unspecified: Secondary | ICD-10-CM

## 2017-11-06 DIAGNOSIS — D702 Other drug-induced agranulocytosis: Secondary | ICD-10-CM

## 2017-11-06 MED ORDER — IPRATROPIUM-ALBUTEROL 0.5-2.5 (3) MG/3ML IN SOLN: 3.0000 mL | Freq: Two times a day (BID) | RESPIRATORY_TRACT | Status: DC

## 2017-11-06 MED ORDER — SENNOSIDES-DOCUSATE SODIUM 8.6-50 MG PO TABS: 1.0000 | ORAL_TABLET | Freq: Every evening | ORAL | 0 refills | Status: AC | PRN

## 2017-11-06 MED ORDER — BISACODYL 5 MG PO TBEC: 5.0000 mg | DELAYED_RELEASE_TABLET | Freq: Every day | ORAL | 0 refills | Status: AC | PRN

## 2017-11-06 MED ORDER — METOPROLOL SUCCINATE ER 50 MG PO TB24: 50.0000 mg | ORAL_TABLET | Freq: Every day | ORAL | 0 refills | Status: AC

## 2017-11-06 MED ORDER — IPRATROPIUM-ALBUTEROL 0.5-2.5 (3) MG/3ML IN SOLN: 3.0000 mL | RESPIRATORY_TRACT | 0 refills | Status: AC | PRN

## 2017-11-06 MED ORDER — IPRATROPIUM-ALBUTEROL 0.5-2.5 (3) MG/3ML IN SOLN: 3.0000 mL | RESPIRATORY_TRACT | Status: DC | PRN

## 2017-11-06 MED ORDER — FUROSEMIDE 40 MG PO TABS: 40.0000 mg | ORAL_TABLET | Freq: Every day | ORAL | 0 refills | Status: AC

## 2017-11-06 NOTE — Progress Notes (Signed)
Pt discharged to Hospice via nonemergency transport without incident per MD order. Pt given morphine 2 mg IV at 1636 for breathing related to being anxious regarding transport. Pt's discharge packet given to transport. Family going with patient to Hospice facility.

## 2017-11-06 NOTE — Progress Notes (Signed)
   11/06/17 1305  Clinical Encounter Type  Visited With Patient and family together  Visit Type Follow-up;Spiritual support  Referral From Nurse  Consult/Referral To Chaplain  Spiritual Encounters  Spiritual Needs Prayer;Emotional  Stress Factors  Patient Stress Factors Exhausted;Health changes   [place] visited with Ms. Horwitz while rounding on 1C. The patient was resting bu twoke up once I came in the visit. She shared that she was anxious and tired. Ms. Mullinax pastor came in to visit and I informed the patient that I would return later so she could have time with her minister.

## 2017-11-06 NOTE — Care Management Important Message (Signed)
Important Message  Patient Details  Name: Shelly Arias MRN: [id number] Date of Birth: 1945/05/06   Medicare Important Message Given:  Yes    [name] 11/06/2017, 12:08 PM

## 2017-11-06 NOTE — Progress Notes (Signed)
Shelly Arias   DOB:Jan 13, 1946   ZD#:[id number]    Subjective: Patient had shortness of breath overnight; she needed morphine frequently along with breathing treatments.  This morning her breathing stable.  She continues to need oxygen.  Shortness of breath on minimal exertion.   Objective:  Vitals:   11/06/17 0835 11/06/17 1320  BP: 129/64 121/67  Pulse: (!) 107 (!) 113  Resp:  (!) 24  Temp:  97.9 F (36.6 C)  SpO2: 98% 98%    No intake or output data in the 24 hours ending 11/06/17 2117 GENERAL: Thin built moderately poorly nourished female patient alert, no distress and comfortable.  Accompanied by daughter/son.  On O2 2Lit/min EYES: Positive for pallor. OROPHARYNX: no thrush or ulceration. NECK: supple, no masses felt LYMPH:  no palpable lymphadenopathy in the cervical, axillary or inguinal regions LUNGS: decreased breath sounds to auscultation at bases.  Positive for wheezing on the right side. HEART/CVS: Tachycardic ; regular rhythm and no murmurs; positive for leg swelling bilateral.  ABDOMEN: abdomen soft, non-tender and normal bowel sounds Musculoskeletal:no cyanosis of digits and no clubbing  PSYCH: alert & oriented x 3 with fluent speech NEURO: no focal motor/sensory deficits SKIN: Multiple ecchymosis; petechia noted on the right posterior calf.   Labs:  Lab Results  Component Value Date   WBC 1.6 (L) 11/04/2017   HGB 8.6 (L) 11/04/2017   HCT 24.4 (L) 11/04/2017   MCV 85.0 11/04/2017   PLT 19 (LL) 11/04/2017   NEUTROABS 0.8 (L) 11/04/2017    Lab Results  Component Value Date   NA 129 (L) 11/04/2017   K 3.4 (L) 11/04/2017   CL 87 (L) 11/04/2017   CO2 32 11/04/2017    Studies:  No results found.  Assessment & Plan:   ASSESSMENT & PLAN:   # 72 year old female patient with a history of undiagnosed bone marrow failure syndrome; metastatic breast cancer-currently admitted the hospital for worsening shortness of breath chest pain/non-STEMI with acute  CHF  # "Bone marrow failure syndrome"-of unclear etiology.  Transfusion dependent.  Given the significant decline in performance status/acute CHF non-STEMI-further transfusions are discontinued.  I suspect worsening given the petechia.  #Acute CHF/non-STEMI-no active treatment; continue Lasix morphine Nitropatch beta-blocker.  No antiplatelet therapy.  # Metastatic breast cancer-ER PR positive letrozole.  Discontinue letrozole.  #DNR/DNI -transfer to hospice home when bed available. Discussed with  Patient and family regarding the difficult situation-unable to predict the manner of death.  I suspect internal bleeding to be a potential cause of death/versus worsening cardiac symptoms.  # 25 minutes face-to-face with the patient discussing the above plan of care; more than 50% of time spent on prognosis/ natural history; counseling and coordination. Discussed with Dr.Pyreddy.    [name], MD 11/06/2017  9:17 PM

## 2017-11-06 NOTE — Progress Notes (Signed)
[place] at [place] NAME: Shelly Arias    MR#:  [id number]  DATE OF BIRTH:  [birth year]  SUBJECTIVE: Admitted because of chest pain, non-ST elevation MI, acute on chronic systolic heart failure.  Patient has history of MDS, bone marrow failure, gets recurrent blood transfusion, platelet transfusion, received 1 unit of packed RBC transfusion yesterday, after that patient had trouble breathing, wheezing, could not rest last night.  Today she is very weak, has mild chest pain, shortness of breath.  CHIEF COMPLAINT:   Chief Complaint  Patient presents with  . Chest Pain  Patient seen and evaluated today No spontaneous bleeding so far Cough present Tolerating diet ok No constipation  REVIEW OF SYSTEMS:   ROS CONSTITUTIONAL: No fever,has  fatigue, appears pale, appears chronically ill.  Cachectic.  EYES: No blurred or double vision.  EARS, NOSE, AND THROAT: No tinnitus or ear pain.  RESPIRATORY:  Mild shortness of breath, no chest pain CARDIOVASCULAR: Has some chest pain on and off.   GASTROINTESTINAL: No nausea, vomiting, diarrhea or abdominal pain.  GENITOURINARY: No dysuria, hematuria.  ENDOCRINE: No polyuria, nocturia,  HEMATOLOGY: No anemia, easy bruising or bleeding SKIN: No rash or lesion. MUSCULOSKELETAL: Leg edema.  Marland Kitchen  NEUROLOGIC: No tingling, numbness, weakness.  PSYCHIATRY: No anxiety or depression.   DRUG ALLERGIES:   Allergies  Allergen Reactions  . Codeine Anaphylaxis  . Fish-Derived Products Anaphylaxis  . Morphine Sulfate Other (See Comments)    REACTION: Hypotension  . Adhesive [Tape]     PAPER TAPE OK TO USE  . Oxycodone     Nausea and vomiting    VITALS:  Blood pressure 129/64, pulse (!) 107, temperature 98 F (36.7 C), temperature source Oral, resp. rate 20, height 5\' 1"  (1.549 m), weight 58.4 kg, SpO2 98 %.  PHYSICAL EXAMINATION:  GENERAL:  72 y.o.-year-old patient lying in the bed with no acute  distress.  EYES: Pupils equal, round, reactive to light and accommodation. No scleral icterus. Extraocular muscles intact.  HEENT: Head atraumatic, normocephalic. Oropharynx and nasopharynx clear.  NECK:  Supple, no jugular venous distention. No thyroid enlargement, no tenderness.  LUNGS: Improved breath sounds bilaterally, scattered wheezing, basal rales heard. No use of accessory muscles of respiration.  CARDIOVASCULAR: S1, S2 normal. No murmurs, rubs, or gallops.  ABDOMEN: Soft, nontender, nondistended. Bowel sounds present. No organomegaly or mass.  EXTREMITIES: Bilateral leg edema 1+. NEUROLOGIC: Cranial nerves II through XII are intact. Muscle strength 5/5 in all extremities. Sensation intact. Gait not checked.  PSYCHIATRIC: The patient is alert and oriented x 3.  SKIN: No obvious rash, lesion, or ulcer.    LABORATORY PANEL:   CBC Recent Labs  Lab 11/04/17 0501  WBC 1.6*  HGB 8.6*  HCT 24.4*  PLT 19*   ------------------------------------------------------------------------------------------------------------------  Chemistries  Recent Labs  Lab 11/04/17 0451 11/04/17 0501  NA  --  129*  K  --  3.4*  CL  --  87*  CO2  --  32  GLUCOSE  --  133*  BUN  --  15  CREATININE  --  0.72  CALCIUM  --  8.4*  MG 1.5*  --    ------------------------------------------------------------------------------------------------------------------  Cardiac Enzymes No results for input(s): TROPONINI in the last 168 hours. ------------------------------------------------------------------------------------------------------------------  RADIOLOGY:  No results found.  EKG:   Orders placed or performed during the hospital encounter of 10/29/17  . ED EKG within 10 minutes  . ED EKG within 10 minutes  .  EKG 12-Lead  . EKG 12-Lead  . EKG 12-Lead  . EKG 12-Lead  . EKG 12-Lead    ASSESSMENT AND PLAN:   72 year old female patient with underlying metastatic right breast cancer,  myelodysplastic syndrome with marrow failure gets recurrent blood transfusions and platelet transfusion every week at cancer center comes in with chest pain and found to have elevated troponins and admitted to telemetry.  1.Nonstemi : patient is not candidate for aggressive intervention secondary to her underlying myelodysplastic syndrome and severe thrombocytopenia Status post cardiology evaluation Medical management as feasible Patient and family do not want any aggressive intervention Currently on beta-blocker, nitrates and statin medication  2.  Acute on chronic systolic heart failure with EF 25% Started oral Lasix early Continue oral beta-blocker there is Toprol Low-dose losartan added by cardiology Mucolytics Supportive care  3. Severe thrombocytopenia Platelet transfusions completed yesterday Family does not want any aggressive measures and blood transfusions and platelet transfusions Palliative medicine consultation done Supportive care  4.  Idiopathic bone marrow my failure, stage IV breast cancer Palliative care team  discussed goals of care with the family and patient They do not want any aggressive measures, interventions No more blood work, antibiotics and blood transfusion and platelet transfusions Supportive care Patient is DNR by CODE STATUS  7.Prognosis extremely poor in view of multiple medical problems and comorbidities. Discussed medical condition treatment plan with patient's family in detail along with oncology  8. Awaiting bed at hospice facility   All the records are reviewed and case discussed with Care Management/Social Workerr. Management plans discussed with the patient, family and they are in agreement.  CODE STATUS: DNR  TOTAL TIME TAKING CARE OF THIS PATIENT: 23 minutes.   More than 50% time spent in counseling, coordination of care, discussed with multiple family members especially the son, daughter at bedside.  Spoke with Dr.Yu  from oncology.     POSSIBLE D/C IN 2 to 3 DAYS, DEPENDING ON CLINICAL CONDITION.   Saundra Shelling M.D on 11/06/2017 at 9:02 AM  Between 7am to 6pm - Pager - [phone]  After 6pm go to [email or web address] - password [email or web address] [facility]  [place] [facility]  Office  [phone]  CC: Primary care physician; [name], MD   Note: This dictation was prepared with Dragon dictation along with smaller phrase technology. Any transcriptional errors that result from this process are unintentional.

## 2017-11-06 NOTE — Progress Notes (Signed)
Visit made to new hospice home referral. Patient seen sitting up in bed, alert and oriented, appeared some what dyspneic. Family present in the room, daughter, son and husband. All informed that a bed has become available for transfer today. Writer initiated education regarding hospice services, philosophy and team approach to care with good understanding voiced. Questions answered, patient signed her consents. Plan is for discharge to the hospice home today via EMS, with signed DNR in place. Discharge summary faxed to referral.Hospital care team updated.  Will continue to follow through discharge. Thank you for the opportunity to be involved in the care of this patient and her family. Flo Shanks RN, BSN, [place] [facility] [place], hospital Liaison [phone]

## 2017-11-06 NOTE — Progress Notes (Signed)
EMS notified for a 5 pm pick up. Family and hospital care team aware. Port to remain accessed. Staff RN Ok Edwards aware. Thank you. Flo Shanks RN, BSN, [facility] [place], hospital liaison [phone]

## 2017-11-06 NOTE — Progress Notes (Signed)
Rept received from Duffy Rhody [facility]) RN. Pt sitting in recliner visiting with family. No s/sx distress or c/o such. Pt aware plan of care is that she is to be discharged to Hospice this afternoon. Will continue to monitor.

## 2017-11-06 NOTE — Progress Notes (Signed)
Daily Progress Note   Patient Name: Shelly Arias       Date: 11/06/2017 DOB: 27-Mar-1945  Age: 72 y.o. MRN#: [id number] Attending Physician: Saundra Shelling, MD Primary Care Physician: [name], MD Admit Date: 10/29/2017  Reason for Consultation/Follow-up: Non pain symptom management, Pain control and Psychosocial/spiritual support  Subjective: Patient lying in bed. Daughter and family Doristine Bosworth at bedside. Patient denies pain or discomfort. She does endorse psycho-social fear of the unknown. She verbalizes her awareness of going to residential hospice home but remains somewhat anxious of what to expect. She reports she is worried she will suffer and bleed out due to low platelets and hemoglobin. We discussed in detail that the staff will take great care of her and if she is able to request medication for anxiety to ask for it and not allow her self to wait until the last minute which her daughter states that she feels she does at times. Patient verbalized understanding and agreement. We discussed the process of her having low platelets and although it does put her at risk and she could potentially experience some bleeding, that she may become more tired, fatigued, and began to sleep more. She is hopeful this will be the case. Support was given to patient and family.   Patient and family appreciative of the care given while she has been hospitalized.   Length of Stay: 7  Current Medications: Scheduled Meds:  . fluticasone furoate-vilanterol  1 puff Inhalation Daily  . furosemide  40 mg Oral Daily  . ipratropium-albuterol  3 mL Nebulization BID  . isosorbide mononitrate  30 mg Oral Daily  . megestrol  400 mg Oral BID  . metoprolol succinate  50 mg Oral Daily  . nicotine  21 mg  Transdermal Daily  . oxymetazoline  1 spray Each Nare BID  . pantoprazole  40 mg Oral BID  . sucralfate  1 g Oral TID WC & HS    Continuous Infusions:   PRN Meds: acetaminophen, alum & mag hydroxide-simeth, antiseptic oral rinse, bisacodyl, diphenhydrAMINE, famotidine, guaiFENesin, ipratropium-albuterol, LORazepam, LORazepam, morphine injection, nitroGLYCERIN, ondansetron (ZOFRAN) IV, promethazine, senna-docusate  Physical Exam  Constitutional: She is oriented to person, place, and time. She appears cachectic. She is cooperative. She appears ill.  Cardiovascular: Normal rate, regular rhythm and normal heart sounds.  Pulmonary/Chest:  She has decreased breath sounds.  Shortness of breath   Neurological: She is alert and oriented to person, place, and time.  Psychiatric: Judgment normal. Cognition and memory are normal.  Nursing note and vitals reviewed.           Vital Signs: BP 121/67 (BP Location: Left Arm)   Pulse (!) 113   Temp 97.9 F (36.6 C) (Oral)   Resp (!) 24   Ht 5\' 1"  (1.549 m)   Wt 58.4 kg   SpO2 98%   BMI 24.32 kg/m  SpO2: SpO2: 98 % O2 Device: O2 Device: Nasal Cannula O2 Flow Rate: O2 Flow Rate (L/min): 2 L/min  Intake/output summary: No intake or output data in the 24 hours ending 11/06/17 1626 LBM: Last BM Date: 11/04/17 Baseline Weight: Weight: 58.1 kg Most recent weight: Weight: 58.4 kg       Palliative Assessment/Data: PPS 20%-able to sit/lie     Patient Active Problem List   Diagnosis Date Noted  . Elevated troponin   . Anemia   . Non-ST elevation (NSTEMI) myocardial infarction ([place])   . Chest pain 10/30/2017  . Cellulitis 10/17/2017  . Malnutrition of moderate degree 08/06/2017  . Vaginal bleeding 08/05/2017  . Tachycardia 07/30/2017  . Constipation 12/27/2016  . B12 deficiency 10/09/2016  . MDS (myelodysplastic syndrome) ([place]) 09/24/2016  . GI bleed 08/03/2016  . Thrombocytopenia ([place]) 08/03/2016  . 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  Persistent headaches 07/02/2016  . Mass of upper inner quadrant of left breast 06/20/2016  . Carcinoma of overlapping sites of right breast in female, estrogen receptor positive ([place]) 06/08/2016  . Chronic fatigue 06/08/2016  . Nutritional anemia, unspecified 06/08/2016  . Prediabetes 01/25/2016  . Tobacco abuse 11/06/2012  . History of breast cancer 11/06/2012  . Osteopenia 11/08/2011  . Hyperlipidemia 11/02/2010  . Essential hypertension 12/23/2006  . GERD 12/23/2006    Palliative Care Assessment & Plan   Patient Profile: 72 y.o. female on 10/29/2017 from home with chest pain. She has a past medical history of metastatic right breast cancer ER/PR positive (s/p surgery and chemoradiation), pancytopenia, MDS with marrow failure, osteoporosis, hypertension, hyperlipidemia, GERD, and COPD. On arrival patient reported developing sudden  Onset of midchest and mid back pain after dinner with heartburn. Patient endorsed palpitation, nausea, and diaphoresis. Pain worsens with cough and deep inspiration. During her ED course BUN 27. Creatinine 1.11. AST 104. ALT 100. Troponin 0.77 which later increased to 0.73, 3.26, 5.48. WBC 1.4. Platelets 34. BNP 2675.0, CT of angio showed no PE, moderate right and small to moderate left pleural effusion, pericardial effusion, recurrent partial collapse of right middle lobe with near complete occlusion at the origin of the right middle lobe bronchus. Since admission patient has been seen by Cardiology and is not a candidate for catheterization/PCI. Recommendations to gently diurese. Medical management with po medications. She has also been seen by Oncology who will continue to closely follow and advise for further transfusion needs. Palliative Medicine team consulted for goals of care discussion.   Recommendations/Plan:  DNR/DNI  Full Comfort Measures   CSW and Santiago Glad, RN (hospice liaison) aware of residential hospice needs and awaiting bed availability.   Ativan  PRN for agitation/anxiety  Morphine PRN for pain/shortness of breath   Zofran PRN for nausea   Palliative will continue to support patient, family, and medical team during hospitalization.    Goals of Care and Additional Recommendations:  Limitations on Scope of Treatment: Full Comfort Care  Code Status:  Code Status Orders  (From admission, onward)         Start     Ordered   10/30/17 1457  Do not attempt resuscitation (DNR)  Continuous    Question Answer Comment  In the event of cardiac or respiratory ARREST Do not call a "code blue"   In the event of cardiac or respiratory ARREST Do not perform Intubation, CPR, defibrillation or ACLS   In the event of cardiac or respiratory ARREST Use medication by any route, position, wound care, and other measures to relive pain and suffering. May use oxygen, suction and manual treatment of airway obstruction as needed for comfort.      10/30/17 1456        Code Status History    Date Active Date Inactive Code Status Order ID Comments User Context   10/30/2017 0828 10/30/2017 1456 Full Code [id number]  Arta Silence, MD ED   10/17/2017 1718 10/19/2017 1841 Full Code [id number]  Salary, Avel Peace, MD Inpatient   08/05/2017 1448 08/07/2017 1824 Full Code [id number]  Epifanio Lesches, MD ED   07/21/2017 1841 07/25/2017 2033 Full Code [id number]  Bettey Costa, MD Inpatient   08/03/2016 2042 08/05/2016 1506 Full Code [id number]  Vaughan Basta, MD ED    Advance Directive Documentation     Most Recent Value  Type of Advance Directive  Healthcare Power of [place], Living will  Pre-existing out of facility DNR order (yellow form or pink MOST form)  -  "MOST" Form in Place?  -      Prognosis:   < 2 weeks-in the setting of N-STEMI with medical management only, metastatic right breast cancer ER/PR positive, pancytopenia, MDS with bone marrow failure, osteoporosis, decreased mobility, decreased po intake, hypertension, COPD, oxygen  dependent, severe protein calorie malnutrition, weight loss >10% in the last 3 months, shotrness of breath, acute CHF.   Discharge Planning:  Hospice facility  Care plan was discussed with patient, patient's family, and Dr. Estanislado Pandy.   Thank you for allowing the Palliative Medicine Team to assist in the care of this patient.   Total Time 35 min.  Prolonged Time Billed  NO        Greater than 50%  of this time was spent counseling and coordinating care related to the above assessment and plan.  Alda Lea, NP-BC Palliative Medicine Team  Phone: [phone] Fax: [phone] Pager: [phone] Amion: Bjorn Pippin   Please contact Palliative Medicine Team phone at [phone] for questions and concerns.

## 2017-11-06 NOTE — Discharge Summary (Addendum)
[facility] [place] NAME: Shelly Arias    MR#:  [id number]  DATE OF BIRTH:  08/31/1945  DATE OF ADMISSION:  10/29/2017 ADMITTING PHYSICIAN: Arta Silence, MD  DATE OF DISCHARGE: 11/06/2017  PRIMARY CARE PHYSICIAN: [name], MD   ADMISSION DIAGNOSIS:  Elevated troponin [R74.8] Chest pain, unspecified type [R07.9] Non-ST elevation MI Myelodysplastic syndrome  bone marrow failure idiopathic Severe thrombocytopenia Severe systolic heart failure Stage IV breast cancer  DISCHARGE DIAGNOSIS:  Severe bone marrow failure Severe thrombocytopenia Non-STEMI Severe systolic heart failure Stage IV breast cancer  SECONDARY DIAGNOSIS:   Past Medical History:  Diagnosis Date  . Blood dyscrasia   . Breast cancer ([place])    right, lumpectomy, radiation, chemo  . Breast cancer ([place])    Right, 2007  . Breast cancer ([place]) 06/20/2016   INVASIVE DUCTAL CARCINOMA.  . Collapsed lung 02/14/2017   RIGHT  . Complication of anesthesia    PT STATED AFTER BIL HIP SURGERIES SHE STOPPED BREATHING THE NIGHT AFTER SURGERY  . COPD (chronic obstructive pulmonary disease) ([place])   . GERD (gastroesophageal reflux disease)   . Headache   . History of chemotherapy   . History of methicillin resistant staphylococcus aureus (MRSA) 2011  . History of radiation therapy   . Hyperlipidemia   . Hypertension   . Liver cancer ([place]) 05/2016  . Lung cancer ([place]) 05/2016  . Osteoarthritis    right hip  . Osteoporosis   . Squamous cell carcinoma    leg, Followed by Dr. Nicole Kindred     ADMITTING HISTORY Shelly Arias  is a 72 y.o. female with a known history of metastatic Br Ca (surgery + chemoradiation; Dr. 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 Pt's husband, son and daughter are at bedside. Pt had a platelet transfusion on 10/29/2017. @~1800PM, pt was eating dinner, and developed sudden onset pain in the midchest and mid back. She  cannot tell me if it was sharp/dull, but says it was a pressure, "Similar to a toothache," w/ a component of heartburn/indigestion. She states she laid down, but the pain got worse, and she became short of breath. She endorses palpitations, diaphoresis and nausea (though she has chronic nausea). (-) AP, vomiting, diarrhea. She states the pain was worse w/ cough and deep inspiration, but was non-positional. She called out to her son. He checked her VS, HR 150, BP 140/105. EMS called. Pt's son states pt felt better after receiving breathing treatment. Pt states having Hx of chest radiation. She had an Echo on 08/09 (while recently hospitalized for cellulitis, 08/08-08/10), result report unavailable/not accessible for review. ROS (+) decreased appetite, poor PO intake, dry mouth, fatigue/malaise, generalized weakness, leg edema, cough. Pt has R chest port.Pt and family are realistic and receptive to information. At the end of our encounter, I strongly believe they have a good grasp of the situation and prognosis.   HOSPITAL COURSE:  Patient was admitted to telemetry.  During hospitalization patient was seen by cardiology and oncology.  Patient has severe systolic heart failure and severe thrombocytopenia.  No blood thinner medications could be given and cardiac intervention and procedures could not be done secondary to severe thrombocytopenia.  Patient received PRBC and platelet transfusion during hospitalization but platelet counts continued to drop.  She was treated medically for non-STEMI with beta-blocker, statin and nitrates.  Patient has been getting repeated blood transfusion and platelet transfusion for the last 1 year by oncology.  Patient  has severe systolic heart failure and was diuresed with Lasix.  Patient's overall condition did not improve bone marrow is severely suppressed.  Family does not want any aggressive measures patient does not want any aggressive treatment and intervention.  They want  comfort care.  All active treatments were discontinued and patient was put on comfort measures.  Patient and family opted to be discharged to hospice home.  Patient has a bed at hospice facility and she will be discharged today.  CONSULTS OBTAINED:  Treatment Team:  Arta Silence, MD [name], MD  DRUG ALLERGIES:   Allergies  Allergen Reactions  . Codeine Anaphylaxis  . Fish-Derived Products Anaphylaxis  . Morphine Sulfate Other (See Comments)    REACTION: Hypotension  . Adhesive [Tape]     PAPER TAPE OK TO USE  . Oxycodone     Nausea and vomiting    DISCHARGE MEDICATIONS:   Allergies as of 11/06/2017      Reactions   Codeine Anaphylaxis   Fish-derived Products Anaphylaxis   Morphine Sulfate Other (See Comments)   REACTION: Hypotension   Adhesive [tape]    PAPER TAPE OK TO USE   Oxycodone    Nausea and vomiting      Medication List    STOP taking these medications   acyclovir 400 MG tablet Commonly known as:  ZOVIRAX   ALPRAZolam 0.5 MG tablet Commonly known as:  XANAX   COSAMIN DS 500-400 MG Caps Generic drug:  Glucosamine-Chondroitin   cyanocobalamin 1000 MCG/ML injection Commonly known as:  (VITAMIN B-12)   fexofenadine 180 MG tablet Commonly known as:  ALLEGRA   fluconazole 200 MG tablet Commonly known as:  DIFLUCAN   letrozole 2.5 MG tablet Commonly known as:  FEMARA   levofloxacin 500 MG tablet Commonly known as:  LEVAQUIN   megestrol 625 MG/5ML suspension Commonly known as:  MEGACE ES   nicotine 21 mg/24hr patch Commonly known as:  NICODERM CQ - dosed in mg/24 hours   pantoprazole 40 MG tablet Commonly known as:  PROTONIX   pravastatin 40 MG tablet Commonly known as:  PRAVACHOL   PROBIOTIC & ACIDOPHILUS EX ST PO   prochlorperazine 10 MG tablet Commonly known as:  COMPAZINE   ranitidine 75 MG tablet Commonly known as:  ZANTAC   sucralfate 1 g tablet Commonly known as:  CARAFATE   Vitamin D (Ergocalciferol)  50000 units Caps capsule Commonly known as:  DRISDOL     TAKE these medications   bisacodyl 5 MG EC tablet Commonly known as:  DULCOLAX Take 1 tablet (5 mg total) by mouth daily as needed for moderate constipation.   furosemide 40 MG tablet Commonly known as:  LASIX Take 1 tablet (40 mg total) by mouth daily for 15 days. Start taking on:  11/07/2017   ipratropium-albuterol 0.5-2.5 (3) MG/3ML Soln Commonly known as:  DUONEB Take 3 mLs by nebulization every 4 (four) hours as needed.   metoprolol succinate 50 MG 24 hr tablet Commonly known as:  TOPROL-XL Take 1 tablet (50 mg total) by mouth daily for 15 days. Take with or immediately following a meal. Start taking on:  11/07/2017 What changed:    medication strength  how much to take  additional instructions   senna-docusate 8.6-50 MG tablet Commonly known as:  Senokot-S Take 1 tablet by mouth at bedtime as needed for mild constipation.       Today  Patient seen today Decreased apetite Mild shortness of breath  VITAL SIGNS:  Blood  pressure 129/64, pulse (!) 107, temperature 98 F (36.7 C), temperature source Oral, resp. rate 20, height 5\' 1"  (1.549 m), weight 58.4 kg, SpO2 98 %.  I/O:  No intake or output data in the 24 hours ending 11/06/17 1143  PHYSICAL EXAMINATION:  Physical Exam  GENERAL:  72 y.o.-year-old patient lying in the bed on oxygen via nasal canula LUNGS: Normal breath sounds bilaterally, no wheezing, rales,rhonchi or crepitation. No use of accessory muscles of respiration.  CARDIOVASCULAR: S1, S2 normal. No murmurs, rubs, or gallops.  ABDOMEN: Soft, non-tender, non-distended. Bowel sounds present. No organomegaly or mass.  NEUROLOGIC: Moves all 4 extremities. PSYCHIATRIC: The patient is alert and oriented x 3.  SKIN: No obvious rash, lesion, or ulcer.   DATA REVIEW:   CBC Recent Labs  Lab 11/04/17 0501  WBC 1.6*  HGB 8.6*  HCT 24.4*  PLT 19*    Chemistries  Recent Labs  Lab  11/04/17 0451 11/04/17 0501  NA  --  129*  K  --  3.4*  CL  --  87*  CO2  --  32  GLUCOSE  --  133*  BUN  --  15  CREATININE  --  0.72  CALCIUM  --  8.4*  MG 1.5*  --     Cardiac Enzymes No results for input(s): TROPONINI in the last 168 hours.  Microbiology Results  Results for orders placed or performed in visit on 10/25/17  Gastrointestinal Panel by PCR , Stool     Status: None   Collection Time: 10/25/17  4:03 PM  Result Value Ref Range Status   Campylobacter species NOT DETECTED NOT DETECTED Final   Plesimonas shigelloides NOT DETECTED NOT DETECTED Final   Salmonella species NOT DETECTED NOT DETECTED Final   Yersinia enterocolitica NOT DETECTED NOT DETECTED Final   Vibrio species NOT DETECTED NOT DETECTED Final   Vibrio cholerae NOT DETECTED NOT DETECTED Final   Enteroaggregative E coli (EAEC) NOT DETECTED NOT DETECTED Final   Enteropathogenic E coli (EPEC) NOT DETECTED NOT DETECTED Final   Enterotoxigenic E coli (ETEC) NOT DETECTED NOT DETECTED Final   Shiga like toxin producing E coli (STEC) NOT DETECTED NOT DETECTED Final   Shigella/Enteroinvasive E coli (EIEC) NOT DETECTED NOT DETECTED Final   Cryptosporidium NOT DETECTED NOT DETECTED Final   Cyclospora cayetanensis NOT DETECTED NOT DETECTED Final   Entamoeba histolytica NOT DETECTED NOT DETECTED Final   Giardia lamblia NOT DETECTED NOT DETECTED Final   Adenovirus F40/41 NOT DETECTED NOT DETECTED Final   Astrovirus NOT DETECTED NOT DETECTED Final   Norovirus GI/GII NOT DETECTED NOT DETECTED Final   Rotavirus A NOT DETECTED NOT DETECTED Final   Sapovirus (I, II, IV, and V) NOT DETECTED NOT DETECTED Final    Comment: Performed at [facility], [address] [place], [place] [address]  C difficile quick screen w PCR reflex     Status: None   Collection Time: 10/25/17  4:03 PM  Result Value Ref Range Status   C Diff antigen NEGATIVE NEGATIVE Final   C Diff toxin NEGATIVE NEGATIVE Final   C Diff  interpretation No C. difficile detected.  Final    Comment: Performed at [facility], [address] [place], [place] [address]    RADIOLOGY:  No results found.  Follow up with PCP in 1 week.  Management plans discussed with the patient, family and they are in agreement.  CODE STATUS: DNR    Code Status Orders  (From admission, onward)  Start     Ordered   10/30/17 1457  Do not attempt resuscitation (DNR)  Continuous    Question Answer Comment  In the event of cardiac or respiratory ARREST Do not call a "code blue"   In the event of cardiac or respiratory ARREST Do not perform Intubation, CPR, defibrillation or ACLS   In the event of cardiac or respiratory ARREST Use medication by any route, position, wound care, and other measures to relive pain and suffering. May use oxygen, suction and manual treatment of airway obstruction as needed for comfort.      10/30/17 1456        Code Status History    Date Active Date Inactive Code Status Order ID Comments User Context   10/30/2017 0828 10/30/2017 1456 Full Code [id number]  Arta Silence, MD ED   10/17/2017 1718 10/19/2017 1841 Full Code [id number]  Salary, Avel Peace, MD Inpatient   08/05/2017 1448 08/07/2017 1824 Full Code [id number]  Epifanio Lesches, MD ED   07/21/2017 1841 07/25/2017 2033 Full Code [id number]  Bettey Costa, MD Inpatient   08/03/2016 2042 08/05/2016 1506 Full Code [id number]  Vaughan Basta, MD ED    Advance Directive Documentation     Most Recent Value  Type of Advance Directive  Healthcare Power of Attorney, Living will  Pre-existing out of facility DNR order (yellow form or pink MOST form)  -  "MOST" Form in Place?  -      TOTAL TIME TAKING CARE OF THIS PATIENT ON DAY OF DISCHARGE: more than 35 minutes.   Saundra Shelling M.D on 11/06/2017 at 11:43 AM  Between 7am to 6pm - Pager - [phone]  After 6pm go to [email or web address] - password [email or web address]   Office  [phone]  CC: Primary care physician; [name], MD  Note: This dictation was prepared with Dragon dictation along with smaller phrase technology. Any transcriptional errors that result from this process are unintentional.

## 2017-11-06 NOTE — Progress Notes (Signed)
   11/06/17 1455  Clinical Encounter Type  Visited With Patient and family together  Visit Type Follow-up;Spiritual support  Referral From Nurse  Consult/Referral To Chaplain  Spiritual Encounters  Spiritual Needs Prayer;Emotional   Ch visited with Shelly Arias as a follow up visit. The patient was sitting up in a chair and receiving a constant flow of visitors. Her husband was with her as well as her two children. She is due to be moved to hospice today around 5:00pm. Shelly Arias is very clear in thought and while anxious she is in a good mood. I shared with her in a private moment before I left her room that I wasn't sure if I would see her again in the next few weeks, but I have grown to love her and her family over the last year. She shared the same and said that I was in inspiration to her. She has been an inspiration to me and 100's other patient's, nurses, and Doctors.

## 2017-11-07 ENCOUNTER — Inpatient Hospital Stay: Payer: Medicare Other

## 2017-11-07 LAB — TYPE AND SCREEN
ABO/RH(D): O POS
Antibody Screen: POSITIVE
UNIT DIVISION: 0
Unit division: 0

## 2017-11-07 LAB — BPAM RBC
BLOOD PRODUCT EXPIRATION DATE: 201909232359
Blood Product Expiration Date: 201909242359
Unit Type and Rh: 5100
Unit Type and Rh: 5100

## 2017-11-08 ENCOUNTER — Telehealth: Payer: Self-pay

## 2017-11-08 ENCOUNTER — Inpatient Hospital Stay: Payer: Medicare Other

## 2017-11-08 ENCOUNTER — Telehealth: Payer: Self-pay | Admitting: *Deleted

## 2017-11-08 ENCOUNTER — Telehealth: Payer: Self-pay | Admitting: Internal Medicine

## 2017-11-08 ENCOUNTER — Other Ambulatory Visit: Payer: Self-pay | Admitting: *Deleted

## 2017-11-08 DIAGNOSIS — D649 Anemia, unspecified: Secondary | ICD-10-CM

## 2017-11-08 DIAGNOSIS — D696 Thrombocytopenia, unspecified: Secondary | ICD-10-CM

## 2017-11-08 NOTE — Telephone Encounter (Signed)
Unable to reach patient for transitional care management. Second attempt, different number.

## 2017-11-08 NOTE — Telephone Encounter (Signed)
Per Dr. Rogue Bussing - Patient's son contacted him this morning. Patient is requesting that she be d/c from hospice services and hospice home. No longer wants hospice services. Patient is requesting continuous blood transfusions/plt infusion.   Per md, patient needs to come into hospital 1c tomorrow for blood transfusion 1 unit and 1 unit of plts. I contacted the blood bank. plts can be preordered today. No additional lab draw is needed today for plt infusion. plts can be ordered today in preparation for tomorrow by 1:30 pm. Shelly in blood bank aware that the plan is to have patient receive 1 unit of blood. Pt will still need type and screen tomorrow. However, 1 unit of blood is still in blood bank and ready for patient. Pt no longer has her blue type and cross arm band on, so pt will have new type/screen drawn. Blood orders entered. plts released in epic and other orders are still pending- RN to release.  Bed placement contacted. Pt will arrive at 10 am tomorrow at admit/register desk. RN requested a note to be placed in computer by admission coordinator "that pt will need to come around 10 am to give ample time for the patient's nurse on 1C to collect type/screen and admin pre-meds."  I notified today's charge nurse on 1C regarding the plan above. Discussed the time sensitivity for expiration around the plt infusion. 1C made aware of patient's decision to decline hospice services and proceed with blood/plt infusions. Goals of care is to have patient receive unit of plts/blood and then be discharged.  I contacted the patient's son to provide the admitting arrival times at registration and to further discuss any concerns regarding the plan of care. Son asked if pt could have a cbc upon arrival. Dr. Rogue Bussing approved this and orders for cbc for RN to release were entered in epic.  Son given new apt for lab only apt on Tuesday 9/3- 1245 and possible blood/plts on Wednesday 9/4.  Keep all other apts the same.   Son did request that Dr. B contact the patient. The patient wants to talk to Dr. Rogue Bussing before the end of the day. Dr. B please contact patient's cell phone. She "doesn't have any concerns" per son, but wants to "have reassurance from Dr. Rogue Bussing regarding her decisions."

## 2017-11-08 NOTE — Telephone Encounter (Signed)
Unable to reach patient for transitional care management. Will follow.

## 2017-11-08 NOTE — Telephone Encounter (Signed)
I called blood bank and 1C and admission bed placement to cnl blood and inpatient admission.

## 2017-11-08 NOTE — Telephone Encounter (Signed)
Spoke to son- in AM; family wanted discharge from hospice; plan platelets/ PRBC transfusion.   But late in afternoon- as per family pt declining fast- spoke to pt/son- will continue hospice; HOLD OFF transfusions.  Blood bank/ 1C floor- informed/cancelled the transfusion.

## 2017-11-09 ENCOUNTER — Observation Stay: Admission: RE | Admit: 2017-11-09 | Payer: Medicare Other | Source: Ambulatory Visit | Admitting: Internal Medicine

## 2017-11-10 DEATH — deceased

## 2017-11-12 ENCOUNTER — Inpatient Hospital Stay: Payer: Medicare Other

## 2017-11-12 ENCOUNTER — Telehealth: Payer: Self-pay

## 2017-11-12 NOTE — Telephone Encounter (Signed)
Unable to reach patient for transitional care management.  Left a message to contact the office for HFU.

## 2017-11-13 ENCOUNTER — Ambulatory Visit: Payer: Medicare Other

## 2017-11-14 ENCOUNTER — Other Ambulatory Visit: Payer: Medicare Other

## 2017-11-14 ENCOUNTER — Ambulatory Visit: Payer: Medicare Other | Admitting: Internal Medicine

## 2017-11-14 ENCOUNTER — Other Ambulatory Visit: Payer: Self-pay | Admitting: Internal Medicine

## 2017-11-15 ENCOUNTER — Ambulatory Visit: Payer: Medicare Other

## 2017-12-04 ENCOUNTER — Ambulatory Visit: Payer: Medicare Other | Admitting: Family Medicine

## 2018-02-07 ENCOUNTER — Ambulatory Visit: Payer: Medicare Other

## 2019-03-30 IMAGING — MR MR HEAD WO/W CM
9 of 12 series · 30 of 48 positions shown · IV contrast (15 mL MULTIHANCE)
Comparison: None.

CLINICAL DATA: 70 y/o F; breast cancer with worsening headaches and
concern for intracranial metastatic disease.

EXAM:
MRI HEAD WITHOUT AND WITH CONTRAST
TECHNIQUE: Multiplanar, multiecho pulse sequences of the brain and surrounding
structures were obtained without and with intravenous contrast.
CONTRAST:  15mL MULTIHANCE GADOBENATE DIMEGLUMINE 529 MG/ML IV SOLN

[Series 2: T1 · sagittal · 5.0mm · 0.47mm/px · 1 of 22 slices shown]
[im 1/22]
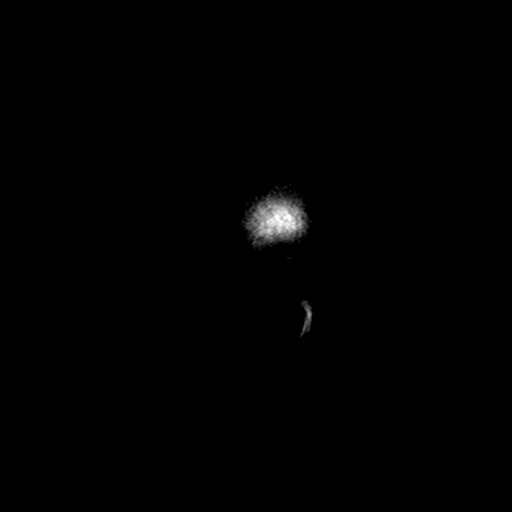

[Series 4: DWI · axial · 3.0mm · 0.94mm/px · z∈[-72,+77]mm · 3 of 51 slices shown (1 of 2)]
[im 1/51]
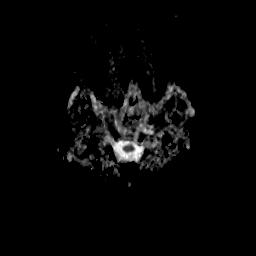
[im 26/51]
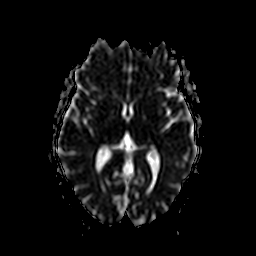
[im 51/51]
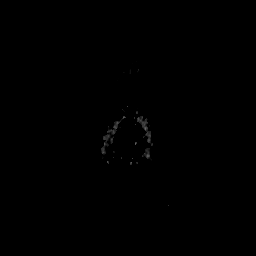

[Series 7: DWI · coronal · 5.0mm · 1.80mm/px · 3 of 39 slices shown (2 of 2)]
[im 1/39]
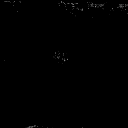
[im 20/39]
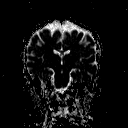
[im 39/39]
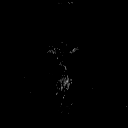

[Series 9: T2 · axial · 5.0mm · 0.45mm/px · z∈[-77,+77]mm · 2 of 23 slices shown (1 of 2)]
[im 1/23]
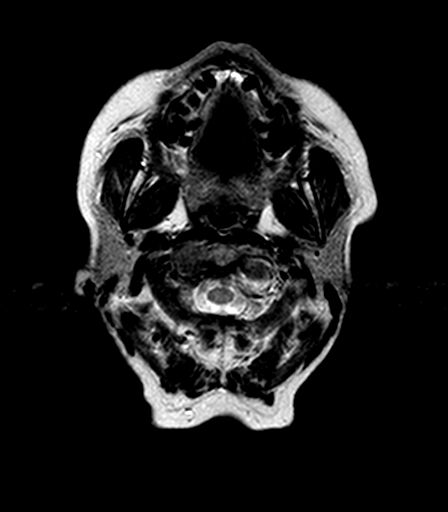
[im 23/23]
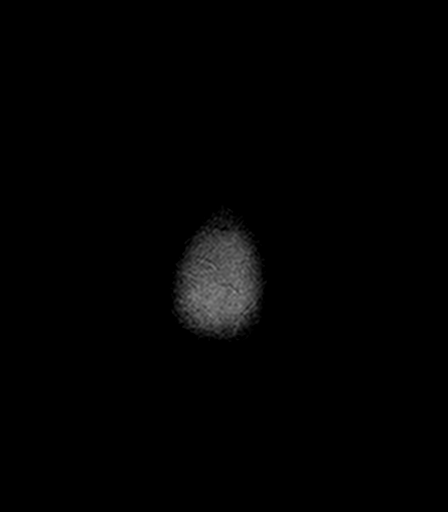

[Series 10: FLAIR · axial · 3.0mm · 0.90mm/px · z∈[-73,+73]mm · 4 of 50 slices shown]
[im 1/50]
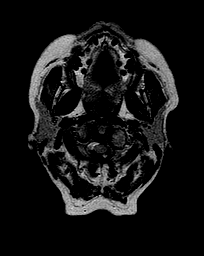
[im 17/50]
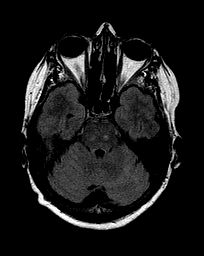
[im 33/50]
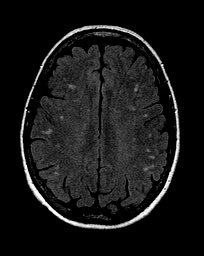
[im 50/50]
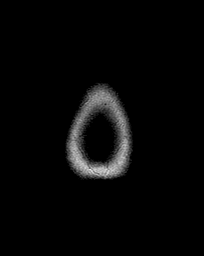

[Series 11: T2 · axial · 5.0mm · 0.45mm/px · z∈[-78,+78]mm · 2 of 27 slices shown (2 of 2)]
[im 1/27]
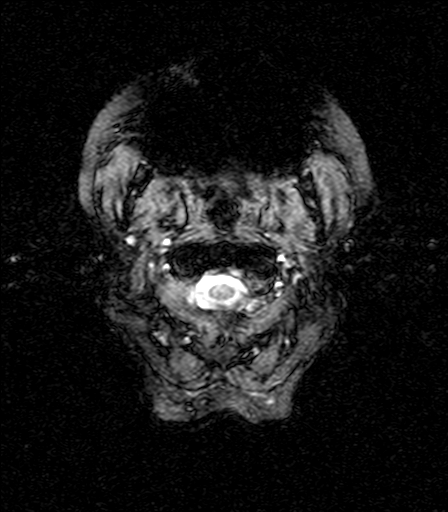
[im 27/27]
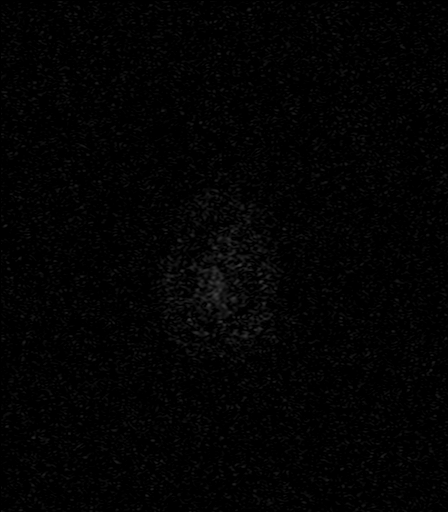

[Series 13: T2 post-contrast · coronal · 5.0mm · 0.45mm/px · 2 of 29 slices shown]
[im 1/29]
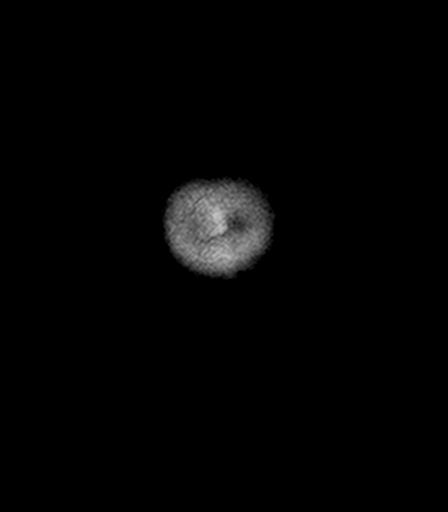
[im 29/29]
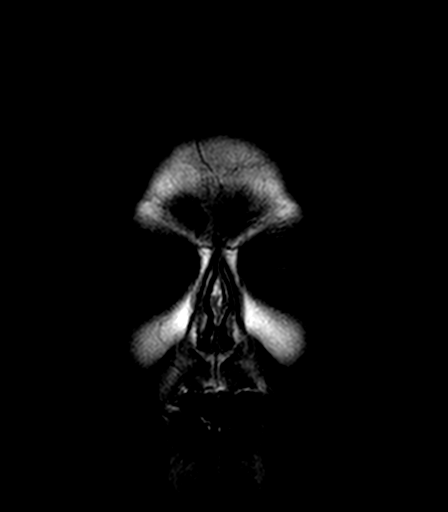

[Series 14: T1 post-contrast · axial · 1.0mm · 0.45mm/px · z∈[-79,+79]mm · 11 of 160 slices shown (1 of 2)]
[im 1/160]
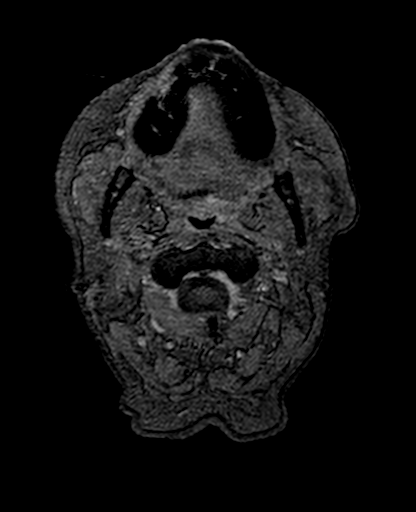
[im 16/160]
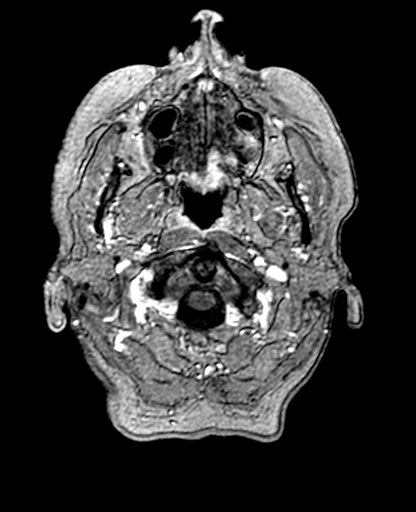
[im 32/160]
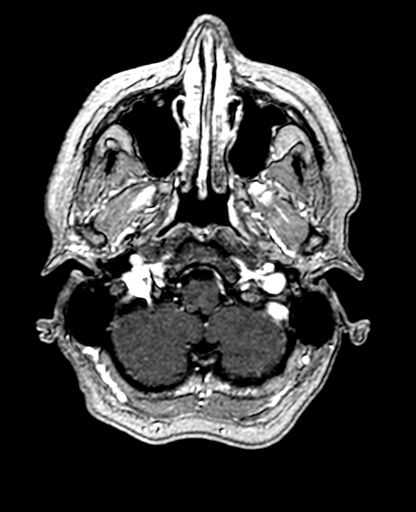
[im 48/160]
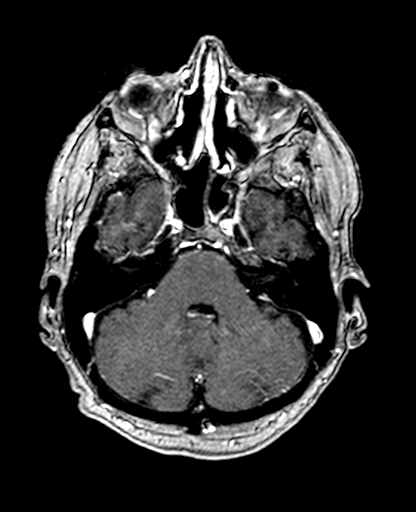
[im 64/160]
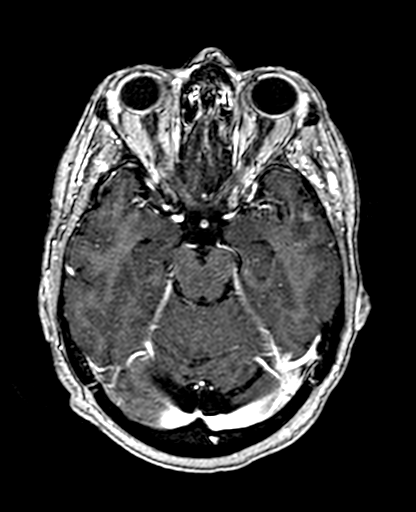
[im 80/160]
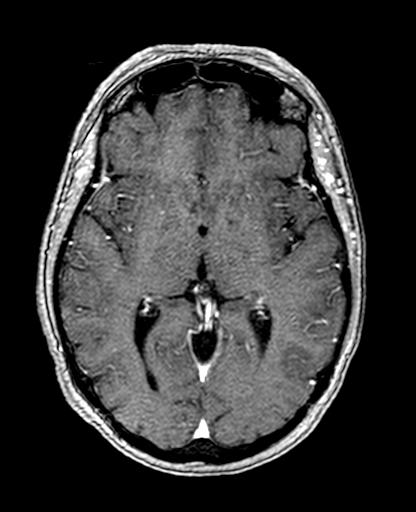
[im 96/160]
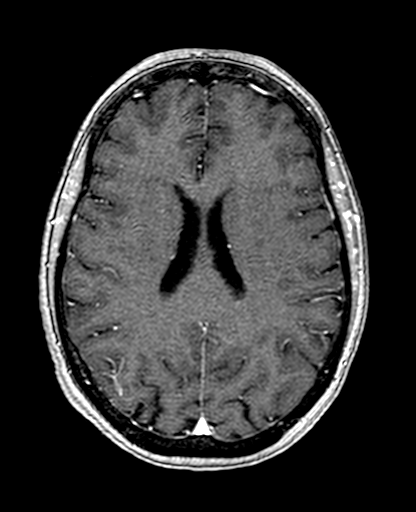
[im 112/160]
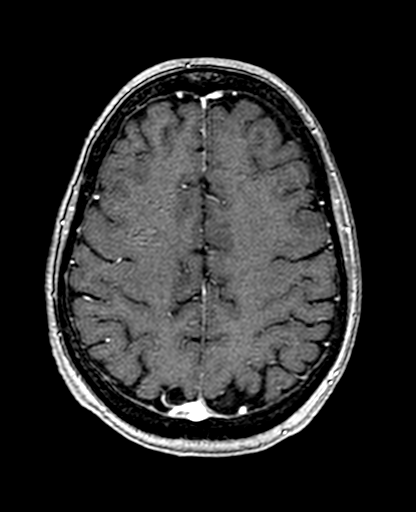
[im 128/160]
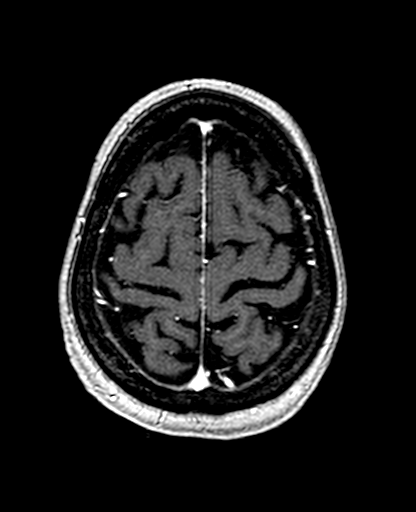
[im 144/160]
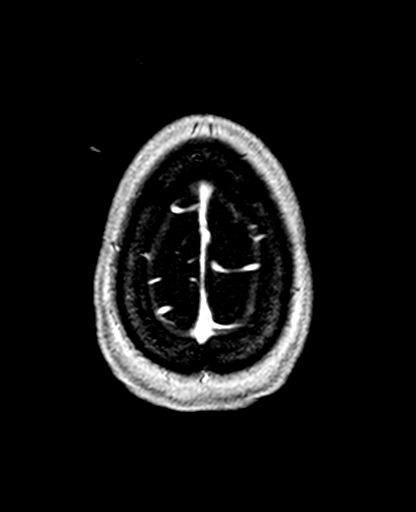
[im 160/160]
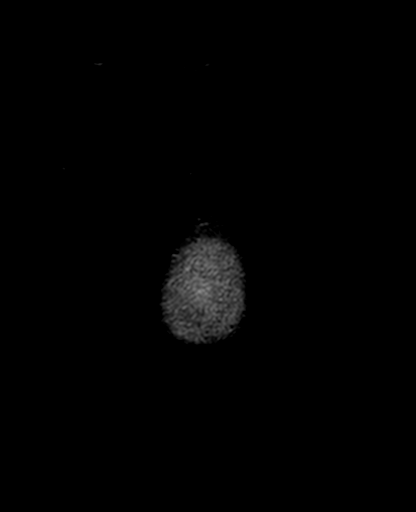

[Series 15: T1 post-contrast · coronal · 5.0mm · 0.45mm/px · 2 of 29 slices shown (2 of 2)]
[im 1/29]
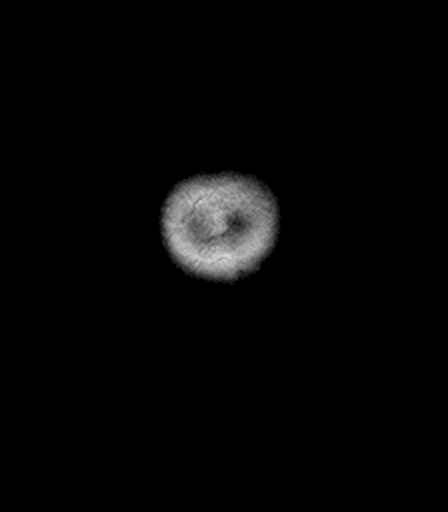
[im 29/29]
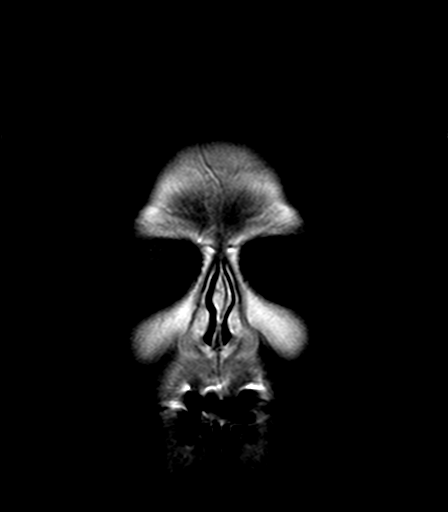

[30 of 48 positions shown; findings below may reference images not displayed]

FINDINGS: Brain: No acute infarction, hemorrhage, hydrocephalus, or
extra-axial collection. Scattered foci of T2 FLAIR hyperintense
signal abnormality in subcortical and periventricular white matter
compatible with mild chronic microvascular ischemic changes. Mild
brain parenchymal volume loss. Small chronic lacunar infarct within
the superior vermis. After administration of intravenous contrast
there is no abnormal enhancement of the brain.

Vascular: Normal flow voids.

Skull and upper cervical spine: Normal marrow signal.

Sinuses/Orbits: Status post maxillary antrostomy and partial
ethmoidectomy. Paranasal sinus mucosal thickening. No abnormal
signal of the mastoid air cells. Orbits are unremarkable.

Other: None.
IMPRESSION: 1. No evidence of intracranial metastatic disease identified.
2. Mild chronic microvascular ischemic changes and mild parenchymal
volume loss of the brain.
3. Paranasal sinus postsurgical changes and mild mucosal thickening.

By: Gargi Tiger M.D.

## 2019-11-19 IMAGING — CR DG CHEST 2V
1 series · 2 of 2 positions shown · non-contrast
Comparison: PET-CT 10/19/2016.  CT chest 06/13/2016.

CLINICAL DATA: Shortness of breath.

EXAM:
CHEST  2 VIEW

[Series 1: dg chest 2 view · 0.14mm/px · 2 of 2 slices shown]
[im 1/2]
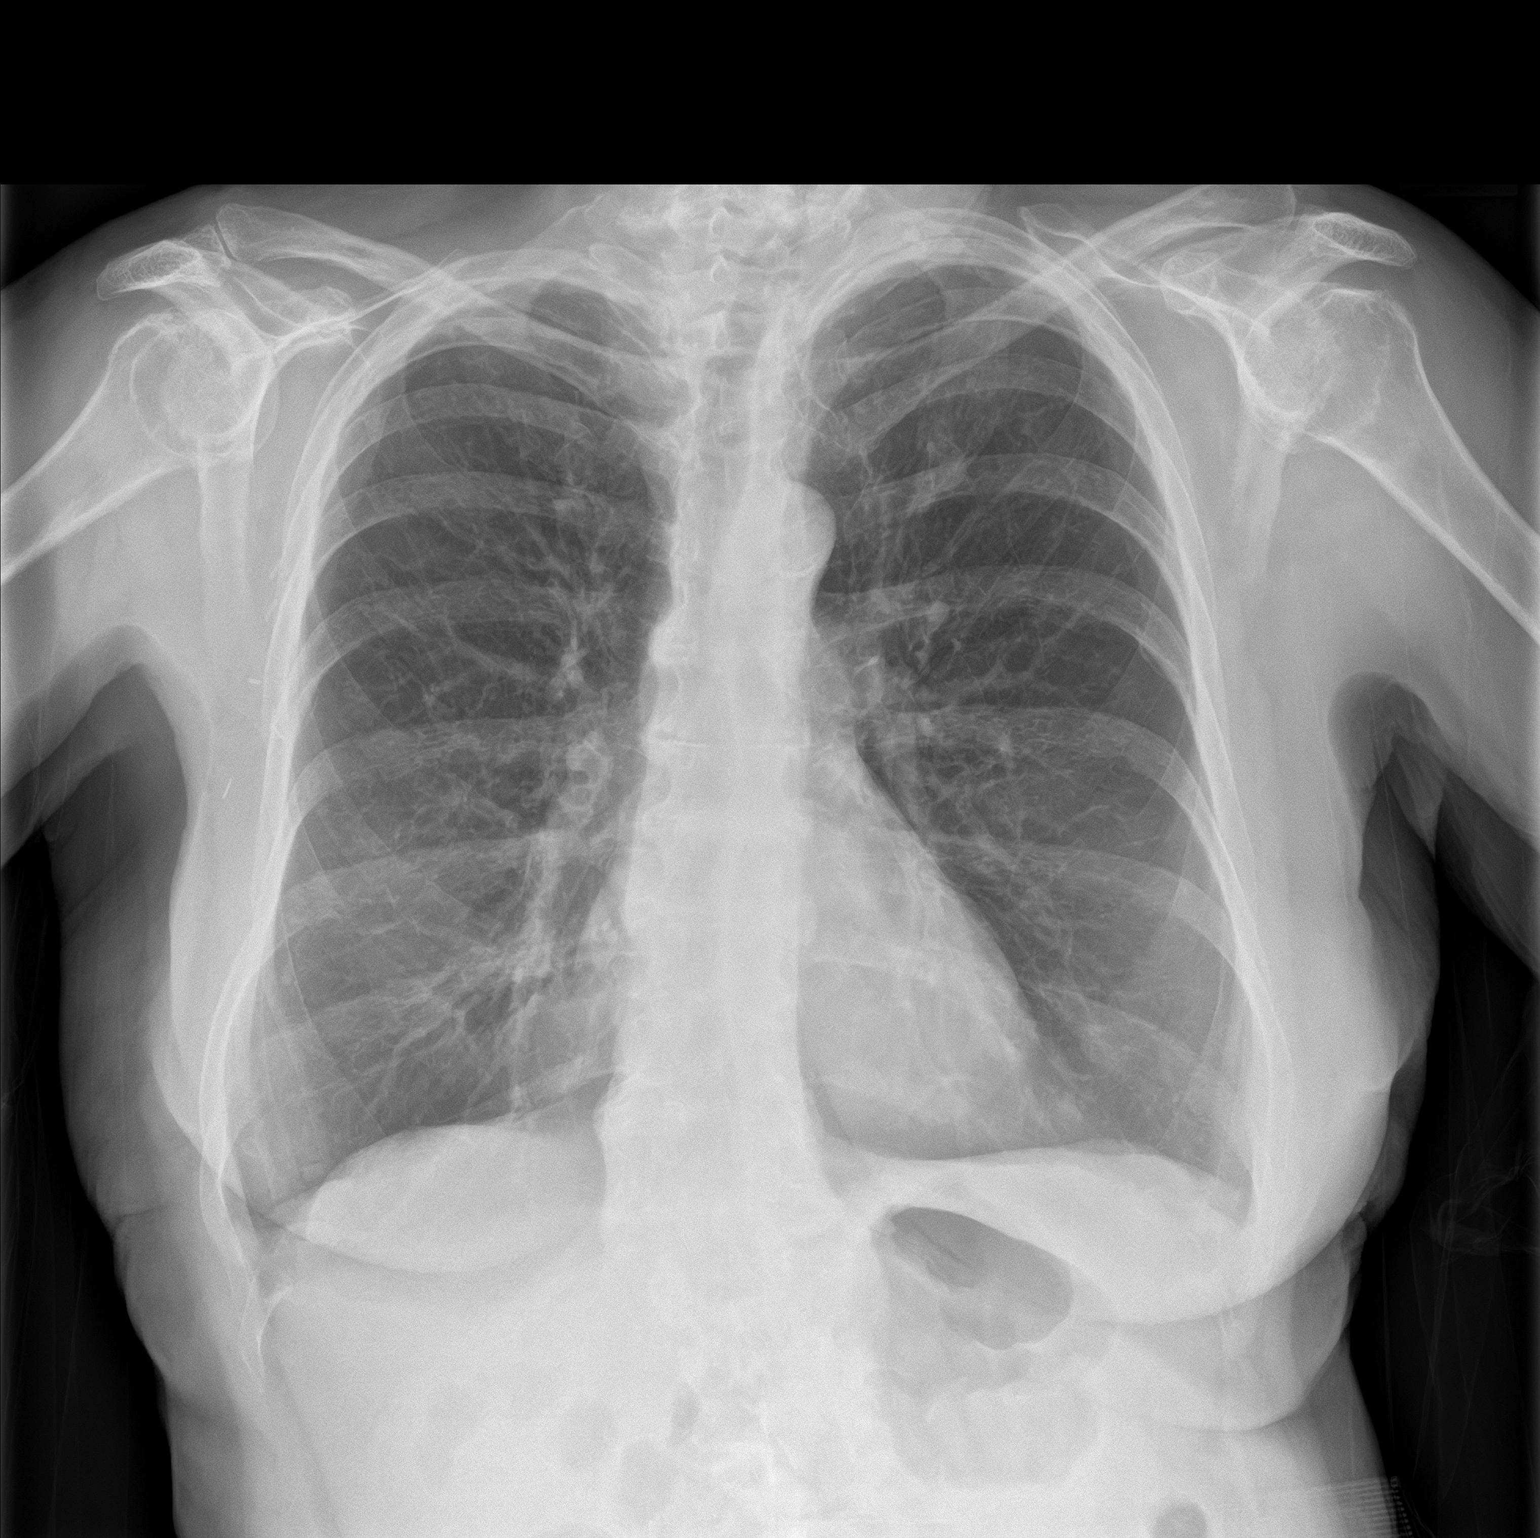
[im 2/2]
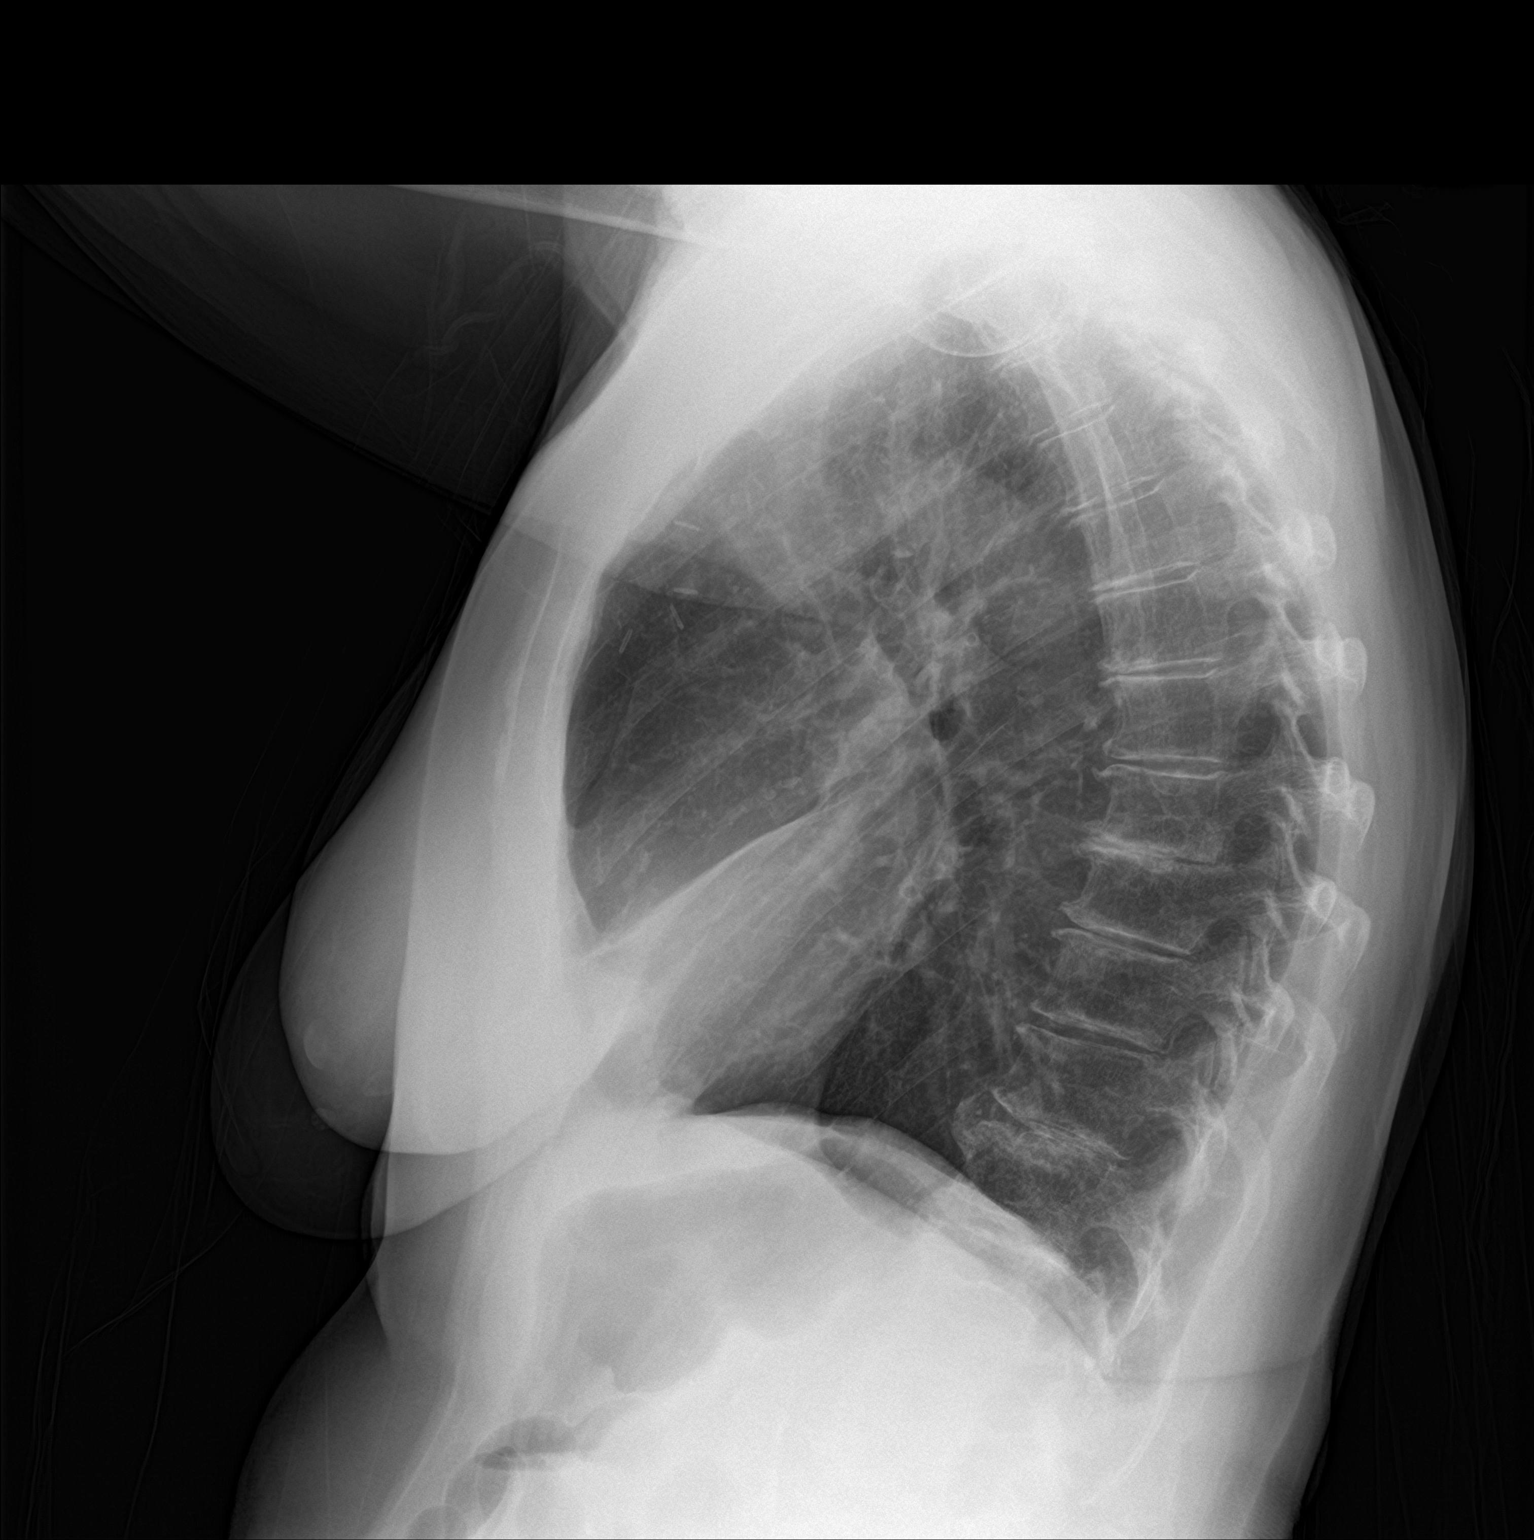

[2 of 2 positions shown; findings below may reference images not displayed]

FINDINGS: Mediastinum hilar structures are normal. Mild right middle lobe
infiltrate noted suggesting mild pneumonia. Reference is made to
prior PET-CT of 10/19/2016 and 06/13/2016 for discussion of
pulmonary nodules and adenopathy previously noted. No pleural
effusion or pneumothorax. Surgical clips right chest. Degenerative
changes scoliosis thoracic spine.
IMPRESSION: Mild right middle lobe infiltrate suggesting mild pneumonia.
